# Patient Record
Sex: Male | Born: 1970 | Race: White | Hispanic: No | Marital: Single | Smoking: Former smoker
Health system: Southern US, Community
[De-identification: ages and names within clinical notes are randomized; demographics above are authoritative.]

## PROBLEM LIST (undated history)

## (undated) DIAGNOSIS — E781 Pure hyperglyceridemia: Secondary | ICD-10-CM

## (undated) DIAGNOSIS — I255 Ischemic cardiomyopathy: Secondary | ICD-10-CM

## (undated) DIAGNOSIS — I10 Essential (primary) hypertension: Secondary | ICD-10-CM

## (undated) DIAGNOSIS — I2510 Atherosclerotic heart disease of native coronary artery without angina pectoris: Secondary | ICD-10-CM

## (undated) DIAGNOSIS — F1010 Alcohol abuse, uncomplicated: Secondary | ICD-10-CM

## (undated) DIAGNOSIS — I513 Intracardiac thrombosis, not elsewhere classified: Secondary | ICD-10-CM

## (undated) DIAGNOSIS — F419 Anxiety disorder, unspecified: Secondary | ICD-10-CM

## (undated) DIAGNOSIS — R059 Cough, unspecified: Secondary | ICD-10-CM

## (undated) DIAGNOSIS — R570 Cardiogenic shock: Secondary | ICD-10-CM

## (undated) DIAGNOSIS — I469 Cardiac arrest, cause unspecified: Secondary | ICD-10-CM

## (undated) DIAGNOSIS — I5020 Unspecified systolic (congestive) heart failure: Secondary | ICD-10-CM

## (undated) DIAGNOSIS — I509 Heart failure, unspecified: Secondary | ICD-10-CM

## (undated) DIAGNOSIS — I499 Cardiac arrhythmia, unspecified: Secondary | ICD-10-CM

## (undated) DIAGNOSIS — K219 Gastro-esophageal reflux disease without esophagitis: Secondary | ICD-10-CM

## (undated) DIAGNOSIS — Z720 Tobacco use: Secondary | ICD-10-CM

---

## 2001-09-25 ENCOUNTER — Emergency Department (HOSPITAL_COMMUNITY): Admission: EM | Admit: 2001-09-25 | Discharge: 2001-09-25 | Payer: Self-pay | Admitting: Emergency Medicine

## 2001-09-25 ENCOUNTER — Encounter: Payer: Self-pay | Admitting: Emergency Medicine

## 2001-10-02 ENCOUNTER — Emergency Department (HOSPITAL_COMMUNITY): Admission: EM | Admit: 2001-10-02 | Discharge: 2001-10-02 | Payer: Self-pay | Admitting: Emergency Medicine

## 2005-08-15 ENCOUNTER — Emergency Department (HOSPITAL_COMMUNITY): Admission: EM | Admit: 2005-08-15 | Discharge: 2005-08-15 | Payer: Self-pay | Admitting: Emergency Medicine

## 2005-12-17 ENCOUNTER — Inpatient Hospital Stay (HOSPITAL_COMMUNITY): Admission: EM | Admit: 2005-12-17 | Discharge: 2005-12-18 | Payer: Self-pay | Admitting: Emergency Medicine

## 2010-02-26 ENCOUNTER — Emergency Department (HOSPITAL_COMMUNITY): Admission: EM | Admit: 2010-02-26 | Discharge: 2010-02-26 | Payer: Self-pay | Admitting: Emergency Medicine

## 2011-07-30 ENCOUNTER — Encounter (HOSPITAL_COMMUNITY): Payer: Self-pay | Admitting: Emergency Medicine

## 2011-07-30 ENCOUNTER — Emergency Department (INDEPENDENT_AMBULATORY_CARE_PROVIDER_SITE_OTHER)
Admission: EM | Admit: 2011-07-30 | Discharge: 2011-07-30 | Disposition: A | Discharge status: Home / Self Care | Payer: BC Managed Care – PPO | Source: Home / Self Care | Attending: Family Medicine | Admitting: Family Medicine

## 2011-07-30 DIAGNOSIS — K219 Gastro-esophageal reflux disease without esophagitis: Secondary | ICD-10-CM

## 2011-07-30 HISTORY — DX: Gastro-esophageal reflux disease without esophagitis: K21.9

## 2011-07-30 MED ORDER — ESOMEPRAZOLE MAGNESIUM 40 MG PO CPDR: 40.0000 mg | DELAYED_RELEASE_CAPSULE | Freq: Every day | ORAL | Status: AC

## 2011-07-30 NOTE — ED Provider Notes (Signed)
History     CSN: [id number]  Arrival date & time 07/30/11  1632   First MD Initiated Contact with Patient 07/30/11 1729      Chief Complaint  Patient presents with  . Gastrophageal Reflux    (Consider location/radiation/quality/duration/timing/severity/associated sxs/prior treatment) Patient is a 41 y.o. male presenting with GERD. The history is provided by the patient.  Gastrophageal Reflux This is a chronic (chronic acid reflux problem, prior w/u..) problem. The current episode started more than 1 week ago. The problem has been gradually worsening. Pertinent negatives include no abdominal pain. The symptoms are relieved by medications.    Past Medical History  Diagnosis Date  . GERD (gastroesophageal reflux disease)     History reviewed. No pertinent past surgical history.  No family history on file.  History  Substance Use Topics  . Smoking status: Current Everyday Smoker  . Smokeless tobacco: Not on file  . Alcohol Use: Yes      Review of Systems  Constitutional: Negative.   Cardiovascular: Negative.   Gastrointestinal: Negative.  Negative for nausea, abdominal pain and blood in stool.    Allergies  Review of patient's allergies indicates no known allergies.  Home Medications   Current Outpatient Rx  Name Route Sig Dispense Refill  . RANITIDINE HCL 75 MG PO TABS Oral Take 75 mg by mouth 2 (two) times daily.    Marland Kitchen ESOMEPRAZOLE MAGNESIUM 40 MG PO CPDR Oral Take 1 capsule (40 mg total) by mouth daily. 30 capsule 1    BP 168/118  Pulse 90  Temp(Src) 98.3 F (36.8 C) (Oral)  Resp 18  SpO2 98%  Physical Exam  Nursing note and vitals reviewed. Constitutional: He is oriented to person, place, and time. He appears well-developed and well-nourished.  Cardiovascular: Normal rate, normal heart sounds and intact distal pulses.   Pulmonary/Chest: Breath sounds normal.  Abdominal: Soft. Bowel sounds are normal. He exhibits no distension and no mass. There is no  tenderness.  Neurological: He is alert and oriented to person, place, and time.  Skin: Skin is warm and dry.    ED Course  Procedures (including critical care time)  Labs Reviewed - No data to display No results found.   1. GERD (gastroesophageal reflux disease)       MDM          Barkley Bruns, MD 07/30/11 [phone]

## 2011-07-30 NOTE — ED Notes (Signed)
PT HERE EPIGASTRIC BURNING PAIN WITH EATING AND AFTER THAT STARTED X 2WEEKS AGO.PT HAS BEEN TAKING NEXIUM AND RANITIDINE IN WHICH HE RAN OUT X 2WEEKS AGO.PT HERE FOR RX REFILL

## 2015-09-29 ENCOUNTER — Emergency Department (HOSPITAL_COMMUNITY)
Admission: EM | Admit: 2015-09-29 | Discharge: 2015-09-29 | Disposition: A | Discharge status: Home / Self Care | Payer: BLUE CROSS/BLUE SHIELD | Attending: Emergency Medicine | Admitting: Emergency Medicine

## 2015-09-29 ENCOUNTER — Encounter (HOSPITAL_COMMUNITY): Payer: Self-pay | Admitting: Emergency Medicine

## 2015-09-29 ENCOUNTER — Emergency Department (HOSPITAL_COMMUNITY): Payer: BLUE CROSS/BLUE SHIELD

## 2015-09-29 DIAGNOSIS — Y9289 Other specified places as the place of occurrence of the external cause: Secondary | ICD-10-CM | POA: Diagnosis not present

## 2015-09-29 DIAGNOSIS — Z79899 Other long term (current) drug therapy: Secondary | ICD-10-CM | POA: Insufficient documentation

## 2015-09-29 DIAGNOSIS — S01512A Laceration without foreign body of oral cavity, initial encounter: Secondary | ICD-10-CM | POA: Diagnosis not present

## 2015-09-29 DIAGNOSIS — F17200 Nicotine dependence, unspecified, uncomplicated: Secondary | ICD-10-CM | POA: Diagnosis not present

## 2015-09-29 DIAGNOSIS — Y998 Other external cause status: Secondary | ICD-10-CM | POA: Diagnosis not present

## 2015-09-29 DIAGNOSIS — G510 Bell's palsy: Secondary | ICD-10-CM | POA: Diagnosis not present

## 2015-09-29 DIAGNOSIS — X58XXXA Exposure to other specified factors, initial encounter: Secondary | ICD-10-CM | POA: Insufficient documentation

## 2015-09-29 DIAGNOSIS — Y9389 Activity, other specified: Secondary | ICD-10-CM | POA: Insufficient documentation

## 2015-09-29 DIAGNOSIS — K219 Gastro-esophageal reflux disease without esophagitis: Secondary | ICD-10-CM | POA: Insufficient documentation

## 2015-09-29 DIAGNOSIS — R29810 Facial weakness: Secondary | ICD-10-CM | POA: Diagnosis present

## 2015-09-29 LAB — COMPREHENSIVE METABOLIC PANEL
ALK PHOS: 78 U/L (ref 38–126)
ALT: 26 U/L (ref 17–63)
AST: 24 U/L (ref 15–41)
Albumin: 4.3 g/dL (ref 3.5–5.0)
Anion gap: 11 (ref 5–15)
BUN: 8 mg/dL (ref 6–20)
CALCIUM: 9.5 mg/dL (ref 8.9–10.3)
CHLORIDE: 105 mmol/L (ref 101–111)
CO2: 24 mmol/L (ref 22–32)
Creatinine, Ser: 1.09 mg/dL (ref 0.61–1.24)
GFR calc Af Amer: >60 mL/min (ref >60)
Glucose, Bld: 74 mg/dL (ref 65–99)
Potassium: 3.8 mmol/L (ref 3.5–5.1)
Sodium: 140 mmol/L (ref 135–145)
TOTAL PROTEIN: 7.5 g/dL (ref 6.5–8.1)
Total Bilirubin: 0.5 mg/dL (ref 0.3–1.2)

## 2015-09-29 LAB — DIFFERENTIAL
BASOS ABS: 0 10*3/uL (ref 0.0–0.1)
BASOS PCT: 0 %
Eosinophils Absolute: 0.2 10*3/uL (ref 0.0–0.7)
Eosinophils Relative: 3 %
LYMPHS ABS: 3.1 10*3/uL (ref 0.7–4.0)
LYMPHS PCT: 44 %
MONOS PCT: 8 %
Monocytes Absolute: 0.5 10*3/uL (ref 0.1–1.0)
NEUTROS ABS: 3.1 10*3/uL (ref 1.7–7.7)
Neutrophils Relative %: 45 %

## 2015-09-29 LAB — I-STAT TROPONIN, ED: Troponin i, poc: 0.01 ng/mL (ref 0.00–0.08)

## 2015-09-29 LAB — CBC
HEMATOCRIT: 48 % (ref 39.0–52.0)
HEMOGLOBIN: 15.9 g/dL (ref 13.0–17.0)
MCH: 30 pg (ref 26.0–34.0)
MCHC: 33.1 g/dL (ref 30.0–36.0)
MCV: 90.6 fL (ref 78.0–100.0)
Platelets: 322 10*3/uL (ref 150–400)
RBC: 5.3 MIL/uL (ref 4.22–5.81)
RDW: 12.4 % (ref 11.5–15.5)
WBC: 6.9 10*3/uL (ref 4.0–10.5)

## 2015-09-29 LAB — PROTIME-INR
INR: 0.95 (ref 0.00–1.49)
Prothrombin Time: 12.9 seconds (ref 11.6–15.2)

## 2015-09-29 LAB — I-STAT CHEM 8, ED
BUN: 8 mg/dL (ref 6–20)
CALCIUM ION: 1.13 mmol/L (ref 1.12–1.23)
Chloride: 103 mmol/L (ref 101–111)
Creatinine, Ser: 1.1 mg/dL (ref 0.61–1.24)
Glucose, Bld: 72 mg/dL (ref 65–99)
HCT: 52 % (ref 39.0–52.0)
Hemoglobin: 17.7 g/dL — ABNORMAL HIGH (ref 13.0–17.0)
Potassium: 3.9 mmol/L (ref 3.5–5.1)
SODIUM: 142 mmol/L (ref 135–145)
TCO2: 25 mmol/L (ref 0–100)

## 2015-09-29 LAB — APTT: APTT: 32 s (ref 24–37)

## 2015-09-29 MED ORDER — VALACYCLOVIR HCL 1 G PO TABS: 1000.0000 mg | ORAL_TABLET | Freq: Three times a day (TID) | ORAL | Status: AC

## 2015-09-29 MED ORDER — PREDNISONE 20 MG PO TABS
60.0000 mg | ORAL_TABLET | Freq: Once | ORAL | Status: AC
  Administered 2015-09-29: 60 mg via ORAL
  Filled 2015-09-29: qty 3

## 2015-09-29 MED ORDER — VALACYCLOVIR HCL 500 MG PO TABS
1000.0000 mg | ORAL_TABLET | Freq: Once | ORAL | Status: AC
  Administered 2015-09-29: 1000 mg via ORAL
  Filled 2015-09-29: qty 2

## 2015-09-29 MED ORDER — CEPHALEXIN 500 MG PO CAPS: 500.0000 mg | ORAL_CAPSULE | Freq: Four times a day (QID) | ORAL | Status: DC

## 2015-09-29 MED ORDER — PREDNISONE 10 MG PO TABS: 20.0000 mg | ORAL_TABLET | Freq: Every day | ORAL | Status: DC

## 2015-09-29 NOTE — Discharge Instructions (Signed)
Bell Palsy Bell palsy is a condition in which the muscles on one side of the face become paralyzed. This often causes one side of the face to droop. It is a common condition and most people recover completely. RISK FACTORS Risk factors for Bell palsy include:  Pregnancy.  Diabetes.  An infection by a virus, such as infections that cause cold sores. CAUSES  Bell palsy is caused by damage to or inflammation of a nerve in your face. It is unclear why this happens, but an infection by a virus may lead to it. Most of the time the reason it happens is unknown. SIGNS AND SYMPTOMS  Symptoms can range from mild to severe and can take place over a number of hours. Symptoms may include:  Being unable to:  Raise one or both eyebrows.  Close one or both eyes.  Feel parts of your face (facial numbness).  Drooping of the eyelid and corner of the mouth.  Weakness in the face.  Paralysis of half your face.  Loss of taste.  Sensitivity to loud noises.  Difficulty chewing.  Tearing up of the affected eye.  Dryness in the affected eye.  Drooling.  Pain behind one ear. DIAGNOSIS  Diagnosis of Bell palsy may include:  A medical history and physical exam.  An MRI.  A CT scan.  Electromyography (EMG). This is a test that checks how your nerves are working. TREATMENT  Treatment may include antiviral medicine to help shorten the length of the condition. Sometimes treatment is not needed and the symptoms go away on their own. HOME CARE INSTRUCTIONS   Take medicines only as directed by your health care provider.  Do facial massages and exercises as directed by your health care provider.  If your eye is affected:  Use moisturizing eye drops to prevent drying of your eye as directed by your health care provider.  Protect your eye as directed by your health care provider. SEEK MEDICAL CARE IF:  Your symptoms do not get better or get worse.  You are drooling.  Your eye is red,  irritated, or hurts. SEEK IMMEDIATE MEDICAL CARE IF:   Another part of your body feels weak or numb.  You have difficulty swallowing.  You have a fever along with symptoms of Bell palsy.  You develop neck pain. MAKE SURE YOU:   Understand these instructions.  Will watch your condition.  Will get help right away if you are not doing well or get worse.   This information is not intended to replace advice given to you by your health care provider. Make sure you discuss any questions you have with your health care provider.   Document Released: 05/11/2005 Document Revised: 01/30/2015 Document Reviewed: 08/18/2013 Elsevier Interactive Patient Education 2016 [employer]. Hypertension Hypertension, commonly called high blood pressure, is when the force of blood pumping through your arteries is too strong. Your arteries are the blood vessels that carry blood from your heart throughout your body. A blood pressure reading consists of a higher number over a lower number, such as 110/72. The higher number (systolic) is the pressure inside your arteries when your heart pumps. The lower number (diastolic) is the pressure inside your arteries when your heart relaxes. Ideally you want your blood pressure below 120/80. Hypertension forces your heart to work harder to pump blood. Your arteries may become narrow or stiff. Having untreated or uncontrolled hypertension can cause heart attack, stroke, kidney disease, and other problems. RISK FACTORS Some risk factors for  high blood pressure are controllable. Others are not.  Risk factors you cannot control include:   Race. You may be at higher risk if you are African American.  Age. Risk increases with age.  Gender. Men are at higher risk than women before age 74 years. After age 54, women are at higher risk than men. Risk factors you can control include:  Not getting enough exercise or physical activity.  Being overweight.  Getting too much fat,  sugar, calories, or salt in your diet.  Drinking too much alcohol. SIGNS AND SYMPTOMS Hypertension does not usually cause signs or symptoms. Extremely high blood pressure (hypertensive crisis) may cause headache, anxiety, shortness of breath, and nosebleed. DIAGNOSIS To check if you have hypertension, your health care provider will measure your blood pressure while you are seated, with your arm held at the level of your heart. It should be measured at least twice using the same arm. Certain conditions can cause a difference in blood pressure between your right and left arms. A blood pressure reading that is higher than normal on one occasion does not mean that you need treatment. If it is not clear whether you have high blood pressure, you may be asked to return on a different day to have your blood pressure checked again. Or, you may be asked to monitor your blood pressure at home for 1 or more weeks. TREATMENT Treating high blood pressure includes making lifestyle changes and possibly taking medicine. Living a healthy lifestyle can help lower high blood pressure. You may need to change some of your habits. Lifestyle changes may include:  Following the DASH diet. This diet is high in fruits, vegetables, and whole grains. It is low in salt, red meat, and added sugars.  Keep your sodium intake below 2,300 mg per day.  Getting at least 30-45 minutes of aerobic exercise at least 4 times per week.  Losing weight if necessary.  Not smoking.  Limiting alcoholic beverages.  Learning ways to reduce stress. Your health care provider may prescribe medicine if lifestyle changes are not enough to get your blood pressure under control, and if one of the following is true:  You are 30-66 years of age and your systolic blood pressure is above 140.  You are 78 years of age or older, and your systolic blood pressure is above 150.  Your diastolic blood pressure is above 90.  You have diabetes, and your  systolic blood pressure is over 449 or your diastolic blood pressure is over 90.  You have kidney disease and your blood pressure is above 140/90.  You have heart disease and your blood pressure is above 140/90. Your personal target blood pressure may vary depending on your medical conditions, your age, and other factors. HOME CARE INSTRUCTIONS  Have your blood pressure rechecked as directed by your health care provider.   Take medicines only as directed by your health care provider. Follow the directions carefully. Blood pressure medicines must be taken as prescribed. The medicine does not work as well when you skip doses. Skipping doses also puts you at risk for problems.  Do not smoke.   Monitor your blood pressure at home as directed by your health care provider. SEEK MEDICAL CARE IF:   You think you are having a reaction to medicines taken.  You have recurrent headaches or feel dizzy.  You have swelling in your ankles.  You have trouble with your vision. SEEK IMMEDIATE MEDICAL CARE IF:  You develop a severe  headache or confusion.  You have unusual weakness, numbness, or feel faint.  You have severe chest or abdominal pain.  You vomit repeatedly.  You have trouble breathing. MAKE SURE YOU:   Understand these instructions.  Will watch your condition.  Will get help right away if you are not doing well or get worse.   This information is not intended to replace advice given to you by your health care provider. Make sure you discuss any questions you have with your health care provider.   Document Released: 05/11/2005 Document Revised: 09/25/2014 Document Reviewed: 03/03/2013 Elsevier Interactive Patient Education [employer].

## 2015-09-29 NOTE — ED Notes (Signed)
Patient verbalized understanding of discharge instructions and denies any further needs or questions at this time. VS stable. Patient ambulatory with steady gait, declined wheelchair.

## 2015-09-29 NOTE — ED Notes (Signed)
C/o R sided facial droop, difficulty closing R eye, R temporal pain and posterior neck pain since yesterday.  Pt also reports severe pain to R tip of tongue from biting it 1 week ago and thought symptoms were related.  No arm drift.  No leg drift.

## 2015-09-29 NOTE — ED Provider Notes (Signed)
CSN: [id number]     Arrival date & time 09/29/15  1648 History   First MD Initiated Contact with Patient 09/29/15 1741     Chief Complaint  Patient presents with  . Facial Droop     (Consider location/radiation/quality/duration/timing/severity/associated sxs/prior Treatment) HPI  45 y.o. Male presents right side facial droop began yesterday.  He bit his tongue one week ago and has pain on tip of tongue at site where he bit, but does not think he had any symptoms at that time leading to the tongue biting.  He has some temporal discomfort, but no definite recent fever, uri, or ear infection. No known sick contacts.  He denies leg, arm, vision changes or weakness.   Past Medical History  Diagnosis Date  . GERD (gastroesophageal reflux disease)    History reviewed. No pertinent past surgical history. No family history on file. Social History  Substance Use Topics  . Smoking status: Current Every Day Smoker  . Smokeless tobacco: None  . Alcohol Use: Yes    Review of Systems  All other systems reviewed and are negative.     Allergies  Review of patient's allergies indicates no known allergies.  Home Medications   Prior to Admission medications   Medication Sig Start Date End Date Taking? Authorizing Provider  ranitidine (ZANTAC) 75 MG tablet Take 75 mg by mouth 2 (two) times daily.    Historical Provider, MD   BP 156/107 mmHg  Pulse 97  Temp(Src) 98.2 F (36.8 C) (Oral)  Resp 18  Ht 5' 8"  (1.727 m)  Wt 81.194 kg  BMI 27.22 kg/m2  SpO2 98% Physical Exam  Constitutional: He is oriented to person, place, and time. He appears well-developed and well-nourished.  HENT:  Head: Normocephalic and atraumatic.  Right Ear: Tympanic membrane and external ear normal.  Left Ear: Tympanic membrane and external ear normal.  Nose: Nose normal. Right sinus exhibits no maxillary sinus tenderness and no frontal sinus tenderness. Left sinus exhibits no maxillary sinus tenderness and no  frontal sinus tenderness.  Tongue with macerated area right tip. No area of abscess noted with no lymphadenopathy and submandibular area.  Eyes: Conjunctivae and EOM are normal. Pupils are equal, round, and reactive to light. Right eye exhibits no nystagmus. Left eye exhibits no nystagmus.  Neck: Normal range of motion. Neck supple.  Cardiovascular: Normal rate, regular rhythm, normal heart sounds and intact distal pulses.   Pulmonary/Chest: Effort normal and breath sounds normal. No respiratory distress. He exhibits no tenderness.  Abdominal: Soft. Bowel sounds are normal. He exhibits no distension and no mass. There is no tenderness.  Musculoskeletal: Normal range of motion. He exhibits no edema or tenderness.  Neurological: He is alert and oriented to person, place, and time. He has normal strength. No sensory deficit. He displays a negative Romberg sign. GCS eye subscore is 4. GCS verbal subscore is 5. GCS motor subscore is 6.  Reflex Scores:      Brachioradialis reflexes are 1+ on the right side and 1+ on the left side.      Patellar reflexes are 2+ on the right side and 2+ on the left side. Patient with normal gait without ataxia, shuffling, spasm, or antalgia. Right facial droop with no movement of right forehead. Patient unable to completely close his right eye. Extremities are equal strength bilaterally  Skin: Skin is warm and dry. No rash noted.  Psychiatric: He has a normal mood and affect. His behavior is normal. Judgment and thought content  normal.  Nursing note and vitals reviewed.   ED Course  Procedures (including critical care time) Labs Review Labs Reviewed  I-STAT CHEM 8, ED - Abnormal; Notable for the following:    Hemoglobin 17.7 (*)    All other components within normal limits  PROTIME-INR  APTT  CBC  DIFFERENTIAL  COMPREHENSIVE METABOLIC PANEL  I-STAT TROPOININ, ED  CBG MONITORING, ED    Imaging Review Ct Head Wo Contrast  09/29/2015  CLINICAL DATA:  RIGHT  facial droop, inability to close RIGHT eye, increased watering in RIGHT eye, cannot blink, stroke symptoms, smoker EXAM: CT HEAD WITHOUT CONTRAST TECHNIQUE: Contiguous axial images were obtained from the base of the skull through the vertex without intravenous contrast. COMPARISON:  None FINDINGS: Normal ventricular morphology. No midline shift or mass effect. Normal appearance of brain parenchyma. No intracranial hemorrhage, mass lesion, or evidence of acute infarction. No extra-axial fluid collections. Visualized paranasal sinuses and mastoid air cells clear. Nasal septal deviation to the RIGHT. Bones otherwise unremarkable. IMPRESSION: No acute intracranial abnormalities. Electronically Signed   By: Lavonia Dana M.D.   On: 09/29/2015 18:16   I have personally reviewed and evaluated these images and lab results as part of my medical decision-making.   EKG Interpretation   Date/Time:  Sunday Sep 29 2015 17:17:04 EDT Ventricular Rate:  88 PR Interval:  136 QRS Duration: 96 QT Interval:  366 QTC Calculation: 442 R Axis:   93 Text Interpretation:  Normal sinus rhythm Rightward axis Borderline ECG  Confirmed by Makenzie Vittorio MD, Isidor Bromell ([id number]) on 09/29/2015 6:04:42 PM      MDM   Final diagnoses:  Bell's palsy  Tongue laceration, initial encounter    Bell's palsy- plan prednsione acyclovir, f/u neuro Tongue laceration - keflex   Pattricia Boss, MD 10/02/15 1557

## 2015-09-29 NOTE — ED Notes (Signed)
MD has already assessed patient and discharged patient before RN assessment.

## 2019-08-08 DIAGNOSIS — H524 Presbyopia: Secondary | ICD-10-CM | POA: Diagnosis not present

## 2019-10-17 ENCOUNTER — Emergency Department (HOSPITAL_COMMUNITY): Payer: BLUE CROSS/BLUE SHIELD

## 2019-10-17 ENCOUNTER — Encounter (HOSPITAL_COMMUNITY): Payer: Self-pay | Admitting: Physician Assistant

## 2019-10-17 ENCOUNTER — Inpatient Hospital Stay (HOSPITAL_COMMUNITY)
Admission: EM | Admit: 2019-10-17 | Discharge: 2019-10-18 | DRG: 247 | Disposition: A | Discharge status: Home / Self Care | Payer: BLUE CROSS/BLUE SHIELD | Attending: Cardiovascular Disease | Admitting: Cardiovascular Disease

## 2019-10-17 ENCOUNTER — Encounter (HOSPITAL_COMMUNITY)
Admission: EM | Disposition: A | Discharge status: Home / Self Care | Payer: Self-pay | Source: Home / Self Care | Attending: Cardiovascular Disease

## 2019-10-17 ENCOUNTER — Other Ambulatory Visit: Payer: Self-pay

## 2019-10-17 DIAGNOSIS — K219 Gastro-esophageal reflux disease without esophagitis: Secondary | ICD-10-CM | POA: Diagnosis not present

## 2019-10-17 DIAGNOSIS — I214 Non-ST elevation (NSTEMI) myocardial infarction: Principal | ICD-10-CM

## 2019-10-17 DIAGNOSIS — G8929 Other chronic pain: Secondary | ICD-10-CM | POA: Diagnosis not present

## 2019-10-17 DIAGNOSIS — I2510 Atherosclerotic heart disease of native coronary artery without angina pectoris: Secondary | ICD-10-CM

## 2019-10-17 DIAGNOSIS — F1010 Alcohol abuse, uncomplicated: Secondary | ICD-10-CM | POA: Diagnosis present

## 2019-10-17 DIAGNOSIS — Z8249 Family history of ischemic heart disease and other diseases of the circulatory system: Secondary | ICD-10-CM | POA: Diagnosis not present

## 2019-10-17 DIAGNOSIS — F17210 Nicotine dependence, cigarettes, uncomplicated: Secondary | ICD-10-CM | POA: Diagnosis not present

## 2019-10-17 DIAGNOSIS — Z720 Tobacco use: Secondary | ICD-10-CM

## 2019-10-17 DIAGNOSIS — Z20822 Contact with and (suspected) exposure to covid-19: Secondary | ICD-10-CM | POA: Diagnosis present

## 2019-10-17 DIAGNOSIS — Z955 Presence of coronary angioplasty implant and graft: Secondary | ICD-10-CM

## 2019-10-17 DIAGNOSIS — E785 Hyperlipidemia, unspecified: Secondary | ICD-10-CM | POA: Diagnosis not present

## 2019-10-17 DIAGNOSIS — M25569 Pain in unspecified knee: Secondary | ICD-10-CM | POA: Diagnosis not present

## 2019-10-17 DIAGNOSIS — I161 Hypertensive emergency: Secondary | ICD-10-CM | POA: Diagnosis present

## 2019-10-17 DIAGNOSIS — E782 Mixed hyperlipidemia: Secondary | ICD-10-CM | POA: Diagnosis not present

## 2019-10-17 DIAGNOSIS — I10 Essential (primary) hypertension: Secondary | ICD-10-CM | POA: Diagnosis not present

## 2019-10-17 DIAGNOSIS — R079 Chest pain, unspecified: Secondary | ICD-10-CM | POA: Diagnosis not present

## 2019-10-17 DIAGNOSIS — I2582 Chronic total occlusion of coronary artery: Secondary | ICD-10-CM | POA: Diagnosis present

## 2019-10-17 DIAGNOSIS — R0602 Shortness of breath: Secondary | ICD-10-CM | POA: Diagnosis not present

## 2019-10-17 DIAGNOSIS — I255 Ischemic cardiomyopathy: Secondary | ICD-10-CM | POA: Diagnosis present

## 2019-10-17 DIAGNOSIS — Z9861 Coronary angioplasty status: Secondary | ICD-10-CM

## 2019-10-17 HISTORY — DX: Tobacco use: Z72.0

## 2019-10-17 HISTORY — PX: CORONARY STENT INTERVENTION: CATH118234

## 2019-10-17 HISTORY — PX: LEFT HEART CATH AND CORONARY ANGIOGRAPHY: CATH118249

## 2019-10-17 HISTORY — DX: Alcohol abuse, uncomplicated: F10.10

## 2019-10-17 LAB — HEPATIC FUNCTION PANEL
ALT: 37 U/L (ref 0–44)
AST: 47 U/L — ABNORMAL HIGH (ref 15–41)
Albumin: 3.8 g/dL (ref 3.5–5.0)
Alkaline Phosphatase: 78 U/L (ref 38–126)
Bilirubin, Direct: 0.2 mg/dL (ref 0.0–0.2)
Indirect Bilirubin: 0.9 mg/dL (ref 0.3–0.9)
Total Bilirubin: 1.1 mg/dL (ref 0.3–1.2)
Total Protein: 7.4 g/dL (ref 6.5–8.1)

## 2019-10-17 LAB — TROPONIN I (HIGH SENSITIVITY)
Troponin I (High Sensitivity): 2723 ng/L (ref <18)
Troponin I (High Sensitivity): 3639 ng/L (ref <18)

## 2019-10-17 LAB — CBC
HCT: 46.4 % (ref 39.0–52.0)
Hemoglobin: 15.6 g/dL (ref 13.0–17.0)
MCH: 30.2 pg (ref 26.0–34.0)
MCHC: 33.6 g/dL (ref 30.0–36.0)
MCV: 89.9 fL (ref 80.0–100.0)
Platelets: 296 10*3/uL (ref 150–400)
RBC: 5.16 MIL/uL (ref 4.22–5.81)
RDW: 12 % (ref 11.5–15.5)
WBC: 9.2 10*3/uL (ref 4.0–10.5)
nRBC: 0 % (ref 0.0–0.2)

## 2019-10-17 LAB — BASIC METABOLIC PANEL
Anion gap: 10 (ref 5–15)
BUN: 8 mg/dL (ref 6–20)
CO2: 24 mmol/L (ref 22–32)
Calcium: 9 mg/dL (ref 8.9–10.3)
Chloride: 104 mmol/L (ref 98–111)
Creatinine, Ser: 1.03 mg/dL (ref 0.61–1.24)
GFR calc Af Amer: >60 mL/min (ref >60)
GFR calc non Af Amer: >60 mL/min (ref >60)
Glucose, Bld: 98 mg/dL (ref 70–99)
Potassium: 3.6 mmol/L (ref 3.5–5.1)
Sodium: 138 mmol/L (ref 135–145)

## 2019-10-17 LAB — ECHOCARDIOGRAM COMPLETE
Height: 68 in
Weight: 2864.22 oz

## 2019-10-17 LAB — POCT ACTIVATED CLOTTING TIME
Activated Clotting Time: 224 seconds
Activated Clotting Time: 235 seconds

## 2019-10-17 LAB — SARS CORONAVIRUS 2 BY RT PCR (HOSPITAL ORDER, PERFORMED IN ~~LOC~~ HOSPITAL LAB): SARS Coronavirus 2: NEGATIVE

## 2019-10-17 LAB — HIV ANTIBODY (ROUTINE TESTING W REFLEX): HIV Screen 4th Generation wRfx: NONREACTIVE

## 2019-10-17 LAB — PROTIME-INR
INR: 0.9 (ref 0.8–1.2)
Prothrombin Time: 11.9 seconds (ref 11.4–15.2)

## 2019-10-17 LAB — BRAIN NATRIURETIC PEPTIDE: B Natriuretic Peptide: 608.8 pg/mL — ABNORMAL HIGH (ref 0.0–100.0)

## 2019-10-17 SURGERY — LEFT HEART CATH AND CORONARY ANGIOGRAPHY
Anesthesia: LOCAL

## 2019-10-17 MED ORDER — SODIUM CHLORIDE 0.9% FLUSH: 3.0000 mL | INTRAVENOUS | Status: DC | PRN

## 2019-10-17 MED ORDER — ASPIRIN 81 MG PO CHEW
324.0000 mg | CHEWABLE_TABLET | Freq: Once | ORAL | Status: AC
  Administered 2019-10-17: 324 mg via ORAL
  Filled 2019-10-17: qty 4

## 2019-10-17 MED ORDER — METOPROLOL TARTRATE 25 MG PO TABS
25.0000 mg | ORAL_TABLET | Freq: Two times a day (BID) | ORAL | Status: DC
  Administered 2019-10-17 (×2): 25 mg via ORAL
  Filled 2019-10-17 (×2): qty 1

## 2019-10-17 MED ORDER — ASPIRIN 81 MG PO CHEW: 81.0000 mg | CHEWABLE_TABLET | ORAL | Status: DC

## 2019-10-17 MED ORDER — FOLIC ACID 1 MG PO TABS: 1.0000 mg | ORAL_TABLET | Freq: Every day | ORAL | Status: DC

## 2019-10-17 MED ORDER — NITROGLYCERIN 1 MG/10 ML FOR IR/CATH LAB
INTRA_ARTERIAL | Status: AC
  Filled 2019-10-17: qty 10

## 2019-10-17 MED ORDER — NITROGLYCERIN 1 MG/10 ML FOR IR/CATH LAB
INTRA_ARTERIAL | Status: DC | PRN
  Administered 2019-10-17: 150 ug

## 2019-10-17 MED ORDER — FOLIC ACID 1 MG PO TABS
1.0000 mg | ORAL_TABLET | Freq: Every day | ORAL | Status: DC
  Administered 2019-10-17 – 2019-10-18 (×2): 1 mg via ORAL
  Filled 2019-10-17 (×2): qty 1

## 2019-10-17 MED ORDER — LIDOCAINE HCL (PF) 1 % IJ SOLN
INTRAMUSCULAR | Status: AC
  Filled 2019-10-17: qty 30

## 2019-10-17 MED ORDER — ONDANSETRON HCL 4 MG/2ML IJ SOLN: 4.0000 mg | Freq: Four times a day (QID) | INTRAMUSCULAR | Status: DC | PRN

## 2019-10-17 MED ORDER — ADULT MULTIVITAMIN W/MINERALS CH: 1.0000 | ORAL_TABLET | Freq: Every day | ORAL | Status: DC

## 2019-10-17 MED ORDER — HYDRALAZINE HCL 20 MG/ML IJ SOLN: 10.0000 mg | INTRAMUSCULAR | Status: AC | PRN

## 2019-10-17 MED ORDER — SODIUM CHLORIDE 0.9% FLUSH
3.0000 mL | Freq: Once | INTRAVENOUS | Status: AC
  Administered 2019-10-17: 3 mL via INTRAVENOUS

## 2019-10-17 MED ORDER — SODIUM CHLORIDE 0.9 % WEIGHT BASED INFUSION: 3.0000 mL/kg/h | INTRAVENOUS | Status: DC

## 2019-10-17 MED ORDER — LABETALOL HCL 5 MG/ML IV SOLN: 10.0000 mg | INTRAVENOUS | Status: AC | PRN

## 2019-10-17 MED ORDER — TICAGRELOR 90 MG PO TABS
ORAL_TABLET | ORAL | Status: DC | PRN
  Administered 2019-10-17: 180 mg via ORAL

## 2019-10-17 MED ORDER — CLOPIDOGREL BISULFATE 75 MG PO TABS
75.0000 mg | ORAL_TABLET | Freq: Every day | ORAL | Status: DC
  Administered 2019-10-18: 75 mg via ORAL
  Filled 2019-10-17 (×2): qty 1

## 2019-10-17 MED ORDER — TICAGRELOR 90 MG PO TABS
ORAL_TABLET | ORAL | Status: AC
  Filled 2019-10-17: qty 2

## 2019-10-17 MED ORDER — MIDAZOLAM HCL 2 MG/2ML IJ SOLN
INTRAMUSCULAR | Status: AC
  Filled 2019-10-17: qty 2

## 2019-10-17 MED ORDER — MIDAZOLAM HCL 2 MG/2ML IJ SOLN
INTRAMUSCULAR | Status: DC | PRN
  Administered 2019-10-17: 2 mg via INTRAVENOUS

## 2019-10-17 MED ORDER — FENTANYL CITRATE (PF) 100 MCG/2ML IJ SOLN
INTRAMUSCULAR | Status: DC | PRN
  Administered 2019-10-17: 25 ug via INTRAVENOUS

## 2019-10-17 MED ORDER — LIDOCAINE HCL (PF) 1 % IJ SOLN
INTRAMUSCULAR | Status: DC | PRN
  Administered 2019-10-17: 3 mL

## 2019-10-17 MED ORDER — FUROSEMIDE 10 MG/ML IJ SOLN: 40.0000 mg | Freq: Once | INTRAMUSCULAR | Status: DC

## 2019-10-17 MED ORDER — ATORVASTATIN CALCIUM 80 MG PO TABS
80.0000 mg | ORAL_TABLET | Freq: Every day | ORAL | Status: DC
  Administered 2019-10-17 – 2019-10-18 (×2): 80 mg via ORAL
  Filled 2019-10-17 (×2): qty 1

## 2019-10-17 MED ORDER — THIAMINE HCL 100 MG/ML IJ SOLN
100.0000 mg | Freq: Every day | INTRAMUSCULAR | Status: DC
  Filled 2019-10-17: qty 2

## 2019-10-17 MED ORDER — NITROGLYCERIN IN D5W 200-5 MCG/ML-% IV SOLN
0.0000 ug/min | INTRAVENOUS | Status: DC
  Administered 2019-10-17: 5 ug/min via INTRAVENOUS
  Filled 2019-10-17: qty 250

## 2019-10-17 MED ORDER — SODIUM CHLORIDE 0.9 % IV SOLN: 250.0000 mL | INTRAVENOUS | Status: DC | PRN

## 2019-10-17 MED ORDER — LORAZEPAM 2 MG/ML IJ SOLN: 1.0000 mg | INTRAMUSCULAR | Status: DC | PRN

## 2019-10-17 MED ORDER — HEPARIN SODIUM (PORCINE) 1000 UNIT/ML IJ SOLN
INTRAMUSCULAR | Status: AC
  Filled 2019-10-17: qty 1

## 2019-10-17 MED ORDER — ATORVASTATIN CALCIUM 80 MG PO TABS: 80.0000 mg | ORAL_TABLET | Freq: Every day | ORAL | Status: DC

## 2019-10-17 MED ORDER — VERAPAMIL HCL 2.5 MG/ML IV SOLN
INTRAVENOUS | Status: DC | PRN
  Administered 2019-10-17: 10 mL via INTRA_ARTERIAL

## 2019-10-17 MED ORDER — ACETAMINOPHEN 325 MG PO TABS: 650.0000 mg | ORAL_TABLET | ORAL | Status: DC | PRN

## 2019-10-17 MED ORDER — FENTANYL CITRATE (PF) 100 MCG/2ML IJ SOLN
INTRAMUSCULAR | Status: AC
  Filled 2019-10-17: qty 2

## 2019-10-17 MED ORDER — HEPARIN SODIUM (PORCINE) 1000 UNIT/ML IJ SOLN
INTRAMUSCULAR | Status: DC | PRN
  Administered 2019-10-17 (×2): 4000 [IU] via INTRAVENOUS
  Administered 2019-10-17: 2000 [IU] via INTRAVENOUS
  Administered 2019-10-17: 4000 [IU] via INTRAVENOUS

## 2019-10-17 MED ORDER — HEPARIN (PORCINE) IN NACL 1000-0.9 UT/500ML-% IV SOLN
INTRAVENOUS | Status: AC
  Filled 2019-10-17: qty 1000

## 2019-10-17 MED ORDER — SODIUM CHLORIDE 0.9% FLUSH
3.0000 mL | Freq: Two times a day (BID) | INTRAVENOUS | Status: DC
  Administered 2019-10-18: 3 mL via INTRAVENOUS

## 2019-10-17 MED ORDER — VERAPAMIL HCL 2.5 MG/ML IV SOLN
INTRAVENOUS | Status: AC
  Filled 2019-10-17: qty 2

## 2019-10-17 MED ORDER — IOHEXOL 350 MG/ML SOLN
INTRAVENOUS | Status: DC | PRN
  Administered 2019-10-17: 110 mL

## 2019-10-17 MED ORDER — ADULT MULTIVITAMIN W/MINERALS CH
1.0000 | ORAL_TABLET | Freq: Every day | ORAL | Status: DC
  Administered 2019-10-17 – 2019-10-18 (×2): 1 via ORAL
  Filled 2019-10-17 (×2): qty 1

## 2019-10-17 MED ORDER — THIAMINE HCL 100 MG PO TABS: 100.0000 mg | ORAL_TABLET | Freq: Every day | ORAL | Status: DC

## 2019-10-17 MED ORDER — SODIUM CHLORIDE 0.9 % WEIGHT BASED INFUSION: 1.0000 mL/kg/h | INTRAVENOUS | Status: DC

## 2019-10-17 MED ORDER — ASPIRIN EC 81 MG PO TBEC
81.0000 mg | DELAYED_RELEASE_TABLET | Freq: Every day | ORAL | Status: DC
  Administered 2019-10-18: 81 mg via ORAL
  Filled 2019-10-17: qty 1

## 2019-10-17 MED ORDER — FUROSEMIDE 10 MG/ML IJ SOLN
40.0000 mg | Freq: Once | INTRAMUSCULAR | Status: AC
  Administered 2019-10-17: 40 mg via INTRAVENOUS

## 2019-10-17 MED ORDER — LORAZEPAM 1 MG PO TABS
1.0000 mg | ORAL_TABLET | ORAL | Status: DC | PRN
  Administered 2019-10-17: 1 mg via ORAL
  Filled 2019-10-17: qty 1

## 2019-10-17 MED ORDER — HEPARIN (PORCINE) IN NACL 1000-0.9 UT/500ML-% IV SOLN
INTRAVENOUS | Status: DC | PRN
  Administered 2019-10-17 (×2): 500 mL

## 2019-10-17 MED ORDER — THIAMINE HCL 100 MG/ML IJ SOLN: 100.0000 mg | Freq: Every day | INTRAMUSCULAR | Status: DC

## 2019-10-17 MED ORDER — SODIUM CHLORIDE 0.9 % IV SOLN: INTRAVENOUS | Status: AC

## 2019-10-17 MED ORDER — SODIUM CHLORIDE 0.9% FLUSH
3.0000 mL | Freq: Two times a day (BID) | INTRAVENOUS | Status: DC
  Administered 2019-10-17: 3 mL via INTRAVENOUS

## 2019-10-17 MED ORDER — FUROSEMIDE 10 MG/ML IJ SOLN
INTRAMUSCULAR | Status: AC
  Filled 2019-10-17: qty 4

## 2019-10-17 MED ORDER — HEPARIN SODIUM (PORCINE) 5000 UNIT/ML IJ SOLN
4000.0000 [IU] | Freq: Once | INTRAMUSCULAR | Status: AC
  Administered 2019-10-17: 4000 [IU] via INTRAVENOUS
  Filled 2019-10-17: qty 1

## 2019-10-17 MED ORDER — NITROGLYCERIN 0.4 MG SL SUBL: 0.4000 mg | SUBLINGUAL_TABLET | SUBLINGUAL | Status: DC | PRN

## 2019-10-17 MED ORDER — SODIUM CHLORIDE 0.9 % IV SOLN
INTRAVENOUS | Status: AC | PRN
  Administered 2019-10-17: 10 mL/h via INTRAVENOUS

## 2019-10-17 MED ORDER — CLOPIDOGREL BISULFATE 300 MG PO TABS
300.0000 mg | ORAL_TABLET | Freq: Once | ORAL | Status: AC
  Administered 2019-10-17: 300 mg via ORAL
  Filled 2019-10-17: qty 1

## 2019-10-17 MED ORDER — THIAMINE HCL 100 MG PO TABS
100.0000 mg | ORAL_TABLET | Freq: Every day | ORAL | Status: DC
  Administered 2019-10-17 – 2019-10-18 (×2): 100 mg via ORAL
  Filled 2019-10-17 (×2): qty 1

## 2019-10-17 MED ORDER — HEPARIN (PORCINE) 25000 UT/250ML-% IV SOLN
1050.0000 [IU]/h | INTRAVENOUS | Status: DC
  Administered 2019-10-17: 1050 [IU]/h via INTRAVENOUS
  Filled 2019-10-17: qty 250

## 2019-10-17 SURGICAL SUPPLY — 18 items
BALLN  ~~LOC~~ SAPPHIRE 4.5X12 (BALLOONS) ×2
BALLN SAPPHIRE 2.5X15 (BALLOONS) ×2
BALLN ~~LOC~~ SAPPHIRE 4.5X12 (BALLOONS) ×1
BALLOON SAPPHIRE 2.5X15 (BALLOONS) IMPLANT
BALLOON ~~LOC~~ SAPPHIRE 4.5X12 (BALLOONS) IMPLANT
CATH 5FR JL3.5 JR4 ANG PIG MP (CATHETERS) ×1 IMPLANT
CATH VISTA GUIDE 6FR JR4 (CATHETERS) ×1 IMPLANT
DEVICE RAD COMP TR BAND LRG (VASCULAR PRODUCTS) ×1 IMPLANT
GLIDESHEATH SLEND SS 6F .021 (SHEATH) ×1 IMPLANT
GUIDEWIRE INQWIRE 1.5J.035X260 (WIRE) IMPLANT
INQWIRE 1.5J .035X260CM (WIRE) ×2
KIT ENCORE 26 ADVANTAGE (KITS) ×1 IMPLANT
KIT HEART LEFT (KITS) ×2 IMPLANT
PACK CARDIAC CATHETERIZATION (CUSTOM PROCEDURE TRAY) ×2 IMPLANT
STENT RESOLUTE ONYX 4.0X18 (Permanent Stent) ×1 IMPLANT
TRANSDUCER W/STOPCOCK (MISCELLANEOUS) ×2 IMPLANT
TUBING CIL FLEX 10 FLL-RA (TUBING) ×2 IMPLANT
WIRE COUGAR XT STRL 190CM (WIRE) ×1 IMPLANT

## 2019-10-17 NOTE — Progress Notes (Signed)
ANTICOAGULATION CONSULT NOTE - Initial Consult  Pharmacy Consult for heparin Indication: chest pain/ACS  No Known Allergies  Patient Measurements: Height: 5' 8"  (172.7 cm) Weight: 81.2 kg (179 lb 0.2 oz) IBW/kg (Calculated) : 68.4 Heparin Dosing Weight: 81 kg  Vital Signs: Temp: 98.8 F (37.1 C) (05/25 0550) BP: 151/105 (05/25 0905) Pulse Rate: 88 (05/25 0905)  Labs: Recent Labs    10/17/19 0605  HGB 15.6  HCT 46.4  PLT 296  CREATININE 1.03  TROPONINIHS 2,723*    Estimated Creatinine Clearance: 85.8 mL/min (by C-G formula based on SCr of 1.03 mg/dL).   Medical History: Past Medical History:  Diagnosis Date  . GERD (gastroesophageal reflux disease)     Medications:  (Not in a hospital admission)   Assessment: 89 YOM who presents with SOB and chest pain now with elevated troponins. Pharmacy consulted to start IV heparin for ACS. SCr, H/H and Plt wnl.   Of note patient has already received an IV heparin bolus.   Goal of Therapy:  Heparin level 0.3-0.7 units/ml Monitor platelets by anticoagulation protocol: Yes   Plan:  -Start heparin infusion at 1050 units/hr  -F/u 6 hr HL -Monitor daily HL, CBC and s/s of bleeding   Albertina Parr, PharmD., BCPS, BCCCP Clinical Pharmacist Clinical phone for 10/17/19 until 3:30pm: [phone] If after 3:30pm, please refer to [facility] for unit-specific pharmacist

## 2019-10-17 NOTE — Interval H&P Note (Signed)
Cath Lab Visit (complete for each Cath Lab visit)  Clinical Evaluation Leading to the Procedure:   ACS: Yes.    Non-ACS:    Anginal Classification: CCS IV  Anti-ischemic medical therapy: Minimal Therapy (1 class of medications)  Non-Invasive Test Results: No non-invasive testing performed  Prior CABG: No previous CABG      History and Physical Interval Note:  10/17/2019 1:57 PM  William Crawford  has presented today for surgery, with the diagnosis of NSTEMI.  The various methods of treatment have been discussed with the patient and family. After consideration of risks, benefits and other options for treatment, the patient has consented to  Procedure(s): LEFT HEART CATH AND CORONARY ANGIOGRAPHY (N/A) as a surgical intervention.  The patient's history has been reviewed, patient examined, no change in status, stable for surgery.  I have reviewed the patient's chart and labs.  Questions were answered to the patient's satisfaction.     Sherren Mocha

## 2019-10-17 NOTE — H&P (View-Only) (Signed)
Cardiology Admission History and Physical:   Patient ID: William Crawford MRN: [id number]; DOB: 14-May-1971   Admission date: 10/17/2019  Primary Care Provider: Patient, No Pcp Per Primary Cardiologist: New to Dr. Burt Knack   Chief Complaint:  CP and SOB  Patient Profile:   William Crawford is a 49 y.o. male with hx of tobacco smoking > 30 pack year, alcohol abuse and family hx of CAD presented for CP and SOB and found to have elevated troponin.   History of Present Illness:   William Crawford had sudden onset substernal chest pressure radiating to jaw and shoulder. Episode last for 6-8 hours. Since then he has intermittent chest pain and shortness of breath with and without exertion. Similar to initial episode but not as intense but progressively getting worse. Yesterday he had worsening shortness of breath with orthopnea and PND. Slipping on multiple pillows. This morning he started to have 7/10 substernal chest pressure an came to ER.   BNP 600. CXR with interstitial edema. He is hypertensive. Hs-troponin 2723. Started on IV heparin and IV nitro. Pain improved to 4-5/10. COVID pending. Got ASA 353m. Will give IV lasix 464mqd.    He drinks 12 pack of beer almost every other day and liquir over weekend. Smokes tobacco 1 pack per day since age [age]Father and uncle had Mi in their 50[address]snd diet of it. No regular visit with PCP. No palpitations, dizziness or syncope. He works as paCurator  Past Medical History:  Diagnosis Date  . Alcohol abuse   . GERD (gastroesophageal reflux disease)   . Tobacco abuse     No past surgical history on file.   Medications Prior to Admission: Prior to Admission medications   Medication Sig Start Date End Date Taking? Authorizing Provider  cetirizine (ZYRTEC) 10 MG tablet Take 10 mg by mouth daily.   Yes [provider]  predniSONE (DELTASONE) 10 MG tablet Take 2 tablets (20 mg total) by mouth daily. Patient not taking: Reported on 10/17/2019 09/29/15    RaPattricia BossMD     Allergies:   No Known Allergies  Social History:   Social History   Socioeconomic History  . Marital status: Married    Spouse name: Not on file  . Number of children: Not on file  . Years of education: Not on file  . Highest education level: Not on file  Occupational History  . Not on file  Tobacco Use  . Smoking status: Current Every Day Smoker  Substance and Sexual Activity  . Alcohol use: Yes  . Drug use: No  . Sexual activity: Not on file  Other Topics Concern  . Not on file  Social History Narrative  . Not on file   Social Determinants of Health   Financial Resource Strain:   . Difficulty of Paying Living Expenses:   Food Insecurity:   . Worried About [name] the Last Year:   . RaArboriculturistn the Last Year:   Transportation Needs:   . LaFilm/video editorMedical):   . Marland Kitchenack of Transportation (Non-Medical):   Physical Activity:   . Days of Exercise per Week:   . Minutes of Exercise per Session:   Stress:   . Feeling of Stress :   Social Connections:   . Frequency of Communication with Friends and Family:   . Frequency of Social Gatherings with Friends and Family:   . Attends Religious Services:   . Active  Member of Clubs or Organizations:   . Attends Archivist Meetings:   Marland Kitchen Marital Status:   Intimate Partner Violence:   . Fear of Current or Ex-Partner:   . Emotionally Abused:   Marland Kitchen Physically Abused:   . Sexually Abused:     Family History:   The patient's family history includes Heart attack in his father.    ROS:  Please see the history of present illness.  All other ROS reviewed and negative.     Physical Exam/Data:   Vitals:   10/17/19 1000 10/17/19 1015 10/17/19 1041 10/17/19 1100  BP: (!) 156/125 (!) 157/94 (!) 162/104 (!) 179/115  Pulse: 98 91 95 (!) 104  Resp: 14 (!) 23 (!) 21 18  Temp:      SpO2: 96% 96% 96% 98%  Weight:      Height:       No intake or output data in the 24  hours ending 10/17/19 1104 Last 3 Weights 10/17/2019 09/29/2015  Weight (lbs) 179 lb 0.2 oz 179 lb  Weight (kg) 81.2 kg 81.194 kg     Body mass index is 27.22 kg/m.  General:  Well nourished, well developed, in no acute distress HEENT: normal Lymph: no adenopathy Neck: noJVD Endocrine:  No thryomegaly Vascular: No carotid bruits; FA pulses 2+ bilaterally without bruits  Cardiac:  normal S1, S2; RRR; no murmur Lungs:  Diffuse crackles  Abd: soft, nontender, no hepatomegaly  Ext: no edema Musculoskeletal:  No deformities, BUE and BLE strength normal and equal Skin: warm and dry  Neuro:  CNs 2-12 intact, no focal abnormalities noted Psych:  Normal affect    EKG:  The ECG that was done today was personally reviewed and demonstrates sinus rhythm with st depression   Relevant CV Studies: Pending cath   Laboratory Data:  High Sensitivity Troponin:   Recent Labs  Lab 10/17/19 0605  TROPONINIHS 2,723*      Chemistry Recent Labs  Lab 10/17/19 0605  NA 138  K 3.6  CL 104  CO2 24  GLUCOSE 98  BUN 8  CREATININE 1.03  CALCIUM 9.0  GFRNONAA >60  GFRAA >60  ANIONGAP 10    Hematology Recent Labs  Lab 10/17/19 0605  WBC 9.2  RBC 5.16  HGB 15.6  HCT 46.4  MCV 89.9  MCH 30.2  MCHC 33.6  RDW 12.0  PLT 296   BNP Recent Labs  Lab 10/17/19 0605  BNP 608.8*     Radiology/Studies:  DG Chest 2 View  Result Date: 10/17/2019 CLINICAL DATA:  Chest pain EXAM: CHEST - 2 VIEW COMPARISON:  None. FINDINGS: Normal heart size and mediastinal contours. Diffuse interstitial opacity with Kerley lines. No pleural effusion or consolidation. No pneumothorax IMPRESSION: Interstitial edema. Electronically Signed   By: Monte Fantasia M.D.   On: 10/17/2019 06:20   TIMI Risk Score for Unstable Angina or Non-ST Elevation MI:   The patient's TIMI risk score is 3, which indicates a 13% risk of all cause mortality, new or recurrent myocardial infarction or need for urgent revascularization  in the next 14 days.   Assessment and Plan:   1. NSTEMI - presentation consistence with ACS. He might had MI last week when had severe crushing chest pressure, SOB radiating to his jaw and shoulder.  - Hs-troponin 2723, pending second reading.  - EKG with ST depression.  - CP improving with IV nitro and heparin.  - Got ASA 375m -Stat echo -Cath early afternoon - The patient  understands that risks include but are not limited to stroke (1 in 1000), death (1 in 52), kidney failure [usually temporary] (1 in 500), bleeding (1 in 200), allergic reaction [possibly serious] (1 in 200), and agrees to proceed.  - Add Lipitor 34m  - Get A1c and Lipid panel  2. Pulmonary edema - BP elevated. CXR with interstitial edema - Give Iv lasix 441mx 1.  - Stat echo  3. Untreated Hypertension - BP elevated. LVH on EKG - Start BB post cath - On nitro currently  4. Tobacco smoking - Advised cessation  5.Alcohol abuse - CIWA protocol    Severity of Illness: The appropriate patient status for this patient is INPATIENT. Inpatient status is judged to be reasonable and necessary in order to provide the required intensity of service to ensure the patient's safety. The patient's presenting symptoms, physical exam findings, and initial radiographic and laboratory data in the context of their chronic comorbidities is felt to place them at high risk for further clinical deterioration. Furthermore, it is not anticipated that the patient will be medically stable for discharge from the hospital within 2 midnights of admission. The following factors support the patient status of inpatient.   " The patient's presenting symptoms include CP and SOB. " The worrisome physical exam findings include crackles on exam  " The initial radiographic and laboratory data are worrisome because of Elevated troponin  " The chronic co-morbidities include tobacco smoking   * I certify that at the point of admission it is my  clinical judgment that the patient will require inpatient hospital care spanning beyond 2 midnights from the point of admission due to high intensity of service, high risk for further deterioration and high frequency of surveillance required.*    For questions or updates, please contact [facility] consult [email or web address] for contact info under   SiJarrett SohoPA  10/17/2019 11:04 AM   Patient seen, examined. Available data reviewed. Agree with findings, assessment, and plan as outlined by ViRobbie LisPA-C.  [age]ear old male with no significant past medical history presents now with non-STEMI.  On my exam, he is alert, oriented, in no distress sitting at about 45 degrees in bed.  JVP is normal, lungs have bilateral inspiratory rales, carotid upstrokes are normal without bruits, heart is regular rate and rhythm without murmur.  There is no S4 gallop.  Abdomen is soft and nontender, extremities have no edema, skin is warm and dry, neurologic is grossly intact with 5/5 strength bilaterally.  High-sensitivity troponin is initially 2723, increased on second set to 3639.  BNP is elevated at 609.  Chest x-ray shows interstitial edema bilaterally.  Renal function is normal.  EKG shows sinus tachycardia 101 bpm, ST/T wave changes consider inferolateral ischemia, rightward axis.  The patient presents with non-STEMI with high risk features of pulmonary edema.  He has had intermittent symptoms now for greater than 1 week.  We will check a stat 2D echocardiogram, continue IV nitroglycerin and heparin, give 1 dose of IV furosemide 40 mg, and anticipate cardiac catheterization this afternoon.  I do not think the patient can lay flat for the procedure at present.  Hopefully he will improve within a few hours of receiving IV furosemide and we will proceed with urgent cardiac catheterization and possible PCI.  I have reviewed the risks, indications, and alternatives to cardiac catheterization,  possible angioplasty, and stenting with the patient. Risks include but are not limited to bleeding, infection, vascular injury, stroke, myocardial  infection, arrhythmia, kidney injury, radiation-related injury in the case of prolonged fluoroscopy use, emergency cardiac surgery, and death. The patient understands the risks of serious complication is 1-2 in 7001 with diagnostic cardiac cath and 1-2% or less with angioplasty/stenting.   Sherren Mocha, M.D. 10/17/2019 11:24 AM

## 2019-10-17 NOTE — Progress Notes (Signed)
Pt had 3 second pause and 2 second pause. Pt's asymptomatic RN notified Card on call. Will continue to monitor.

## 2019-10-17 NOTE — Progress Notes (Signed)
  Echocardiogram 2D Echocardiogram has been performed.  William Crawford M 10/17/2019, 11:48 AM

## 2019-10-17 NOTE — Progress Notes (Signed)
Dr Burt Knack in to talk to pt about the side effects of Brinlinta he was having at this time.  Per Dr Judene Companion will be stopped and continue with Plavix.

## 2019-10-17 NOTE — ED Notes (Signed)
Cards Provider at bedside.

## 2019-10-17 NOTE — ED Triage Notes (Signed)
To ED for eval of sob since last night. States he had cp earlier in the week - center of chest. Pt states he has been taking otc cold medicine and which is associated the sob with - unable to sleep well last night. Speaks in full but slightly broken sentences. SOB is worse with any exertion. Denies recent leg pain. No LE swelling noted.

## 2019-10-17 NOTE — ED Provider Notes (Signed)
[facility] EMERGENCY DEPARTMENT Provider Note   CSN: [id number] Arrival date & time: 10/17/19  0548     History Chief Complaint  Patient presents with  . Shortness of Breath    William Crawford is a 49 y.o. male.  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  Patient does not have a well-defined past medical history and states that he has not seen a doctor in a long time for routine physical.  Patient's father passed away of heart disease in mid-50's.  Patient states that sometime last week (5/19?) he was at work and developed a pressure sensation across his lower chest.  This continued into the evening and then improved but never completely subsided.  It was 10/10.  He did not seek any medical attention for this.  Patient reports that earlier this morning he had difficulty breathing prompting ED visit.  He reports that sometimes he has difficulty breathing due to chronic sinusitis.  He reports current nasal congestion.  He states that he felt worse as time went on which made his shortness of breath worse.  He denied any worsening chest pain with the spell but decided to come to the emergency department for evaluation.  While he was in the waiting room he reports a very slight pressure across his lower pec region which remains.  No associated diaphoresis, vomiting.  Pain did not radiate.  No abdominal pain or back pain.  No weakness, numbness, or tingling in extremities.  Patient is able to lie flat to sleep but reports that it is not unusual for him to sleep on a couple pillows due to history of GERD.  No lower extremity tenderness, swelling or scrotal edema.  Reports chronic knee pain.        Past Medical History:  Diagnosis Date  . GERD (gastroesophageal reflux disease)     There are no problems to display for this patient.   No past surgical history  on file.     No family history on file.  Social History   Tobacco Use  . Smoking status: Current Every Day Smoker  Substance Use Topics  . Alcohol use: Yes  . Drug use: No    Home Medications Prior to Admission medications   Medication Sig Start Date End Date Taking? Authorizing Provider  cephALEXin (KEFLEX) 500 MG capsule Take 1 capsule (500 mg total) by mouth 4 (four) times daily. 09/29/15   Pattricia Boss, MD  predniSONE (DELTASONE) 10 MG tablet Take 2 tablets (20 mg total) by mouth daily. 09/29/15   Pattricia Boss, MD  ranitidine (ZANTAC) 75 MG tablet Take 75 mg by mouth 2 (two) times daily.    [provider]    Allergies    Patient has no known allergies.  Review of Systems   Review of Systems  Constitutional: Negative for diaphoresis and fever.  Eyes: Negative for redness.  Respiratory: Positive for shortness of breath. Negative for cough.   Cardiovascular: Positive for chest pain. Negative for palpitations and leg swelling.  Gastrointestinal: Negative for abdominal pain, nausea and vomiting.  Genitourinary: Negative for dysuria.  Musculoskeletal: Negative for back pain and neck pain.  Skin: Negative for rash.  Neurological: Negative for syncope and light-headedness.  Psychiatric/Behavioral: The patient is not nervous/anxious.     Physical Exam Updated Vital Signs BP (!) 151/105   Pulse 88   Temp 98.8 F (  37.1 C)   Resp (!) 21   Ht 5' 8"  (1.727 m)   Wt 81.2 kg   SpO2 98%   BMI 27.22 kg/m   Physical Exam Vitals and nursing note reviewed.  Constitutional:      Appearance: He is well-developed. He is not diaphoretic.  HENT:     Head: Normocephalic and atraumatic.     Mouth/Throat:     Mouth: Mucous membranes are not dry.  Eyes:     Conjunctiva/sclera: Conjunctivae normal.  Neck:     Vascular: Normal carotid pulses. No carotid bruit or JVD.     Trachea: Trachea normal. No tracheal deviation.  Cardiovascular:     Rate and Rhythm: Normal rate and  regular rhythm.     Pulses: No decreased pulses.     Heart sounds: Normal heart sounds, S1 normal and S2 normal. Heart sounds not distant. No murmur.  Pulmonary:     Effort: Pulmonary effort is normal. No respiratory distress.     Breath sounds: Normal breath sounds. No wheezing, rhonchi or rales.     Comments: No crackles noted Chest:     Chest wall: No tenderness.  Abdominal:     General: Bowel sounds are normal.     Palpations: Abdomen is soft.     Tenderness: There is no abdominal tenderness. There is no guarding or rebound.  Musculoskeletal:     Cervical back: Normal range of motion and neck supple. No muscular tenderness.     Right lower leg: No tenderness. No edema.     Left lower leg: No tenderness. No edema.     Comments: No clinical signs and symptoms of DVT or swelling on exam.  Skin:    General: Skin is warm and dry.     Coloration: Skin is not pale.  Neurological:     Mental Status: He is alert.     ED Results / Procedures / Treatments   Labs (all labs ordered are listed, but only abnormal results are displayed) Labs Reviewed  BRAIN NATRIURETIC PEPTIDE - Abnormal; Notable for the following components:      Result Value   B Natriuretic Peptide 608.8 (*)    All other components within normal limits  TROPONIN I (HIGH SENSITIVITY) - Abnormal; Notable for the following components:   Troponin I (High Sensitivity) 2,723 (*)    All other components within normal limits  SARS CORONAVIRUS 2 BY RT PCR (HOSPITAL ORDER, [facility])  BASIC METABOLIC PANEL  CBC  PROTIME-INR  HEPATIC FUNCTION PANEL  TROPONIN I (HIGH SENSITIVITY)    EKG EKG Interpretation  Date/Time:  Tuesday Oct 17 2019 09:14:43 EDT Ventricular Rate:  90 PR Interval:    QRS Duration: 95 QT Interval:  371 QTC Calculation: 454 R Axis:   83 Text Interpretation: Sinus rhythm LAE, consider biatrial enlargement Repol abnrm, lateral leads Confirmed by Veryl Speak [phone]) on  10/17/2019 9:24:29 AM   Radiology DG Chest 2 View  Result Date: 10/17/2019 CLINICAL DATA:  Chest pain EXAM: CHEST - 2 VIEW COMPARISON:  None. FINDINGS: Normal heart size and mediastinal contours. Diffuse interstitial opacity with Kerley lines. No pleural effusion or consolidation. No pneumothorax IMPRESSION: Interstitial edema. Electronically Signed   By: Monte Fantasia M.D.   On: 10/17/2019 06:20    Procedures Procedures (including critical care time)  Medications Ordered in ED Medications  nitroGLYCERIN 50 mg in dextrose 5 % 250 mL (0.2 mg/mL) infusion (15 mcg/min Intravenous Rate/Dose Change 10/17/19 1039)  heparin ADULT infusion 100 units/mL (25000 units/228m sodium chloride 0.45%) (1,050 Units/hr Intravenous New Bag/Given 10/17/19 1032)  sodium chloride flush (NS) 0.9 % injection 3 mL (3 mLs Intravenous Given 10/17/19 1006)  aspirin chewable tablet 324 mg (324 mg Oral Given 10/17/19 0958)  heparin injection 4,000 Units (4,000 Units Intravenous Given 10/17/19 05284    ED Course  I have reviewed the triage vital signs and the nursing notes.  Pertinent labs & imaging results that were available during my care of the patient were reviewed by me and considered in my medical decision making (see chart for details).  Patient seen and examined. Initial EKG and CXR reviewed personally prior to patient exam. EKG with diffuse depressions noted, changed from 2017.  Work-up initiated -- added BNP and troponins.   Vital signs reviewed and are as follows: BP (!) 151/105   Pulse 88   Temp 98.8 F (37.1 C)   Resp (!) 21   Ht 5' 8"  (1.727 m)   Wt 81.2 kg   SpO2 98%   BMI 27.22 kg/m   9:27 AM Repeat and original EKGs reviewed with Dr. DStark Jock No STEMI but concerning. ASA ordered. Awaiting trop. Will need cardiology consult.   10:21 AM Trop elevated.  Ordered heparin and nitroglycerin drip.  Patient updated.  Spoke with TWannetta Sender-- cardiology will see.  Additional labs ordered.  Patient reports  minimal discomfort right now.   10:48 AM Pt transferred to cath lab.   CRITICAL CARE Performed by: JCarlisle CaterPA-C Total critical care time: 40 minutes Critical care time was exclusive of separately billable procedures and treating other patients. Critical care was necessary to treat or prevent imminent or life-threatening deterioration. Critical care was time spent personally by me on the following activities: development of treatment plan with patient and/or surrogate as well as nursing, discussions with consultants, evaluation of patient's response to treatment, examination of patient, obtaining history from patient or surrogate, ordering and performing treatments and interventions, ordering and review of laboratory studies, ordering and review of radiographic studies, pulse oximetry and re-evaluation of patient's condition.     MDM Rules/Calculators/A&P                      Patient with ACS -- event likely last week. Possible now with some failure.  Will need admitted for cardiac catheterization, risk factor management.   Final Clinical Impression(s) / ED Diagnoses Final diagnoses:  NSTEMI (non-ST elevated myocardial infarction) ([facility]    Rx / [place] Discharge Orders    None       GCarlisle Cater PA-C 10/17/19 1049    DVeryl Speak MD 10/17/19 1445

## 2019-10-17 NOTE — H&P (Addendum)
Cardiology Admission History and Physical:   Patient ID: William Crawford MRN: [id number]; DOB: 1971-01-14   Admission date: 10/17/2019  Primary Care Provider: Patient, No Pcp Per Primary Cardiologist: New to Dr. Burt Knack   Chief Complaint:  CP and SOB  Patient Profile:   William Crawford is a 49 y.o. male with hx of tobacco smoking > 30 pack year, alcohol abuse and family hx of CAD presented for CP and SOB and found to have elevated troponin.   History of Present Illness:   William Crawford had sudden onset substernal chest pressure radiating to jaw and shoulder. Episode last for 6-8 hours. Since then he has intermittent chest pain and shortness of breath with and without exertion. Similar to initial episode but not as intense but progressively getting worse. Yesterday he had worsening shortness of breath with orthopnea and PND. Slipping on multiple pillows. This morning he started to have 7/10 substernal chest pressure an came to ER.   BNP 600. CXR with interstitial edema. He is hypertensive. Hs-troponin 2723. Started on IV heparin and IV nitro. Pain improved to 4-5/10. COVID pending. Got ASA 339m. Will give IV lasix 491mqd.    He drinks 12 pack of beer almost every other day and liquir over weekend. Smokes tobacco 1 pack per day since age 89Father and uncle had Mi in their 5029snd diet of it. No regular visit with PCP. No palpitations, dizziness or syncope. He works as paCurator  Past Medical History:  Diagnosis Date  . Alcohol abuse   . GERD (gastroesophageal reflux disease)   . Tobacco abuse     No past surgical history on file.   Medications Prior to Admission: Prior to Admission medications   Medication Sig Start Date End Date Taking? Authorizing Provider  cetirizine (ZYRTEC) 10 MG tablet Take 10 mg by mouth daily.   Yes [provider]  predniSONE (DELTASONE) 10 MG tablet Take 2 tablets (20 mg total) by mouth daily. Patient not taking: Reported on 10/17/2019 09/29/15    RaPattricia BossMD     Allergies:   No Known Allergies  Social History:   Social History   Socioeconomic History  . Marital status: Married    Spouse name: Not on file  . Number of children: Not on file  . Years of education: Not on file  . Highest education level: Not on file  Occupational History  . Not on file  Tobacco Use  . Smoking status: Current Every Day Smoker  Substance and Sexual Activity  . Alcohol use: Yes  . Drug use: No  . Sexual activity: Not on file  Other Topics Concern  . Not on file  Social History Narrative  . Not on file   Social Determinants of Health   Financial Resource Strain:   . Difficulty of Paying Living Expenses:   Food Insecurity:   . Worried About [name] the Last Year:   . RaArboriculturistn the Last Year:   Transportation Needs:   . LaFilm/video editorMedical):   . Marland Kitchenack of Transportation (Non-Medical):   Physical Activity:   . Days of Exercise per Week:   . Minutes of Exercise per Session:   Stress:   . Feeling of Stress :   Social Connections:   . Frequency of Communication with Friends and Family:   . Frequency of Social Gatherings with Friends and Family:   . Attends Religious Services:   . Active  Member of Clubs or Organizations:   . Attends Archivist Meetings:   Marland Kitchen Marital Status:   Intimate Partner Violence:   . Fear of Current or Ex-Partner:   . Emotionally Abused:   Marland Kitchen Physically Abused:   . Sexually Abused:     Family History:   The patient's family history includes Heart attack in his father.    ROS:  Please see the history of present illness.  All other ROS reviewed and negative.     Physical Exam/Data:   Vitals:   10/17/19 1000 10/17/19 1015 10/17/19 1041 10/17/19 1100  BP: (!) 156/125 (!) 157/94 (!) 162/104 (!) 179/115  Pulse: 98 91 95 (!) 104  Resp: 14 (!) 23 (!) 21 18  Temp:      SpO2: 96% 96% 96% 98%  Weight:      Height:       No intake or output data in the 24  hours ending 10/17/19 1104 Last 3 Weights 10/17/2019 09/29/2015  Weight (lbs) 179 lb 0.2 oz 179 lb  Weight (kg) 81.2 kg 81.194 kg     Body mass index is 27.22 kg/m.  General:  Well nourished, well developed, in no acute distress HEENT: normal Lymph: no adenopathy Neck: noJVD Endocrine:  No thryomegaly Vascular: No carotid bruits; FA pulses 2+ bilaterally without bruits  Cardiac:  normal S1, S2; RRR; no murmur Lungs:  Diffuse crackles  Abd: soft, nontender, no hepatomegaly  Ext: no edema Musculoskeletal:  No deformities, BUE and BLE strength normal and equal Skin: warm and dry  Neuro:  CNs 2-12 intact, no focal abnormalities noted Psych:  Normal affect    EKG:  The ECG that was done today was personally reviewed and demonstrates sinus rhythm with st depression   Relevant CV Studies: Pending cath   Laboratory Data:  High Sensitivity Troponin:   Recent Labs  Lab 10/17/19 0605  TROPONINIHS 2,723*      Chemistry Recent Labs  Lab 10/17/19 0605  NA 138  K 3.6  CL 104  CO2 24  GLUCOSE 98  BUN 8  CREATININE 1.03  CALCIUM 9.0  GFRNONAA >60  GFRAA >60  ANIONGAP 10    Hematology Recent Labs  Lab 10/17/19 0605  WBC 9.2  RBC 5.16  HGB 15.6  HCT 46.4  MCV 89.9  MCH 30.2  MCHC 33.6  RDW 12.0  PLT 296   BNP Recent Labs  Lab 10/17/19 0605  BNP 608.8*     Radiology/Studies:  DG Chest 2 View  Result Date: 10/17/2019 CLINICAL DATA:  Chest pain EXAM: CHEST - 2 VIEW COMPARISON:  None. FINDINGS: Normal heart size and mediastinal contours. Diffuse interstitial opacity with Kerley lines. No pleural effusion or consolidation. No pneumothorax IMPRESSION: Interstitial edema. Electronically Signed   By: Monte Fantasia M.D.   On: 10/17/2019 06:20   TIMI Risk Score for Unstable Angina or Non-ST Elevation MI:   The patient's TIMI risk score is 3, which indicates a 13% risk of all cause mortality, new or recurrent myocardial infarction or need for urgent revascularization  in the next 14 days.   Assessment and Plan:   1. NSTEMI - presentation consistence with ACS. He might had MI last week when had severe crushing chest pressure, SOB radiating to his jaw and shoulder.  - Hs-troponin 2723, pending second reading.  - EKG with ST depression.  - CP improving with IV nitro and heparin.  - Got ASA 325m -Stat echo -Cath early afternoon - The patient  understands that risks include but are not limited to stroke (1 in 1000), death (1 in 86), kidney failure [usually temporary] (1 in 500), bleeding (1 in 200), allergic reaction [possibly serious] (1 in 200), and agrees to proceed.  - Add Lipitor 66m  - Get A1c and Lipid panel  2. Pulmonary edema - BP elevated. CXR with interstitial edema - Give Iv lasix 461mx 1.  - Stat echo  3. Untreated Hypertension - BP elevated. LVH on EKG - Start BB post cath - On nitro currently  4. Tobacco smoking - Advised cessation  5.Alcohol abuse - CIWA protocol    Severity of Illness: The appropriate patient status for this patient is INPATIENT. Inpatient status is judged to be reasonable and necessary in order to provide the required intensity of service to ensure the patient's safety. The patient's presenting symptoms, physical exam findings, and initial radiographic and laboratory data in the context of their chronic comorbidities is felt to place them at high risk for further clinical deterioration. Furthermore, it is not anticipated that the patient will be medically stable for discharge from the hospital within 2 midnights of admission. The following factors support the patient status of inpatient.   " The patient's presenting symptoms include CP and SOB. " The worrisome physical exam findings include crackles on exam  " The initial radiographic and laboratory data are worrisome because of Elevated troponin  " The chronic co-morbidities include tobacco smoking   * I certify that at the point of admission it is my  clinical judgment that the patient will require inpatient hospital care spanning beyond 2 midnights from the point of admission due to high intensity of service, high risk for further deterioration and high frequency of surveillance required.*    For questions or updates, please contact [facility] consult [email or web address] for contact info under   SiJarrett SohoPA  10/17/2019 11:04 AM   Patient seen, examined. Available data reviewed. Agree with findings, assessment, and plan as outlined by ViRobbie LisPA-C.  [age]ear old male with no significant past medical history presents now with non-STEMI.  On my exam, he is alert, oriented, in no distress sitting at about 45 degrees in bed.  JVP is normal, lungs have bilateral inspiratory rales, carotid upstrokes are normal without bruits, heart is regular rate and rhythm without murmur.  There is no S4 gallop.  Abdomen is soft and nontender, extremities have no edema, skin is warm and dry, neurologic is grossly intact with 5/5 strength bilaterally.  High-sensitivity troponin is initially 2723, increased on second set to 3639.  BNP is elevated at 609.  Chest x-ray shows interstitial edema bilaterally.  Renal function is normal.  EKG shows sinus tachycardia 101 bpm, ST/T wave changes consider inferolateral ischemia, rightward axis.  The patient presents with non-STEMI with high risk features of pulmonary edema.  He has had intermittent symptoms now for greater than 1 week.  We will check a stat 2D echocardiogram, continue IV nitroglycerin and heparin, give 1 dose of IV furosemide 40 mg, and anticipate cardiac catheterization this afternoon.  I do not think the patient can lay flat for the procedure at present.  Hopefully he will improve within a few hours of receiving IV furosemide and we will proceed with urgent cardiac catheterization and possible PCI.  I have reviewed the risks, indications, and alternatives to cardiac catheterization,  possible angioplasty, and stenting with the patient. Risks include but are not limited to bleeding, infection, vascular injury, stroke, myocardial  infection, arrhythmia, kidney injury, radiation-related injury in the case of prolonged fluoroscopy use, emergency cardiac surgery, and death. The patient understands the risks of serious complication is 1-2 in 4665 with diagnostic cardiac cath and 1-2% or less with angioplasty/stenting.   Sherren Mocha, M.D. 10/17/2019 11:24 AM

## 2019-10-18 DIAGNOSIS — E782 Mixed hyperlipidemia: Secondary | ICD-10-CM

## 2019-10-18 DIAGNOSIS — I255 Ischemic cardiomyopathy: Secondary | ICD-10-CM

## 2019-10-18 DIAGNOSIS — Z720 Tobacco use: Secondary | ICD-10-CM

## 2019-10-18 DIAGNOSIS — E785 Hyperlipidemia, unspecified: Secondary | ICD-10-CM

## 2019-10-18 DIAGNOSIS — I10 Essential (primary) hypertension: Secondary | ICD-10-CM

## 2019-10-18 DIAGNOSIS — F1010 Alcohol abuse, uncomplicated: Secondary | ICD-10-CM

## 2019-10-18 LAB — BASIC METABOLIC PANEL
Anion gap: 10 (ref 5–15)
BUN: 8 mg/dL (ref 6–20)
CO2: 24 mmol/L (ref 22–32)
Calcium: 9.3 mg/dL (ref 8.9–10.3)
Chloride: 103 mmol/L (ref 98–111)
Creatinine, Ser: 1.1 mg/dL (ref 0.61–1.24)
GFR calc Af Amer: >60 mL/min (ref >60)
GFR calc non Af Amer: >60 mL/min (ref >60)
Glucose, Bld: 87 mg/dL (ref 70–99)
Potassium: 4.1 mmol/L (ref 3.5–5.1)
Sodium: 137 mmol/L (ref 135–145)

## 2019-10-18 LAB — CBC
HCT: 44.2 % (ref 39.0–52.0)
Hemoglobin: 14.7 g/dL (ref 13.0–17.0)
MCH: 29.9 pg (ref 26.0–34.0)
MCHC: 33.3 g/dL (ref 30.0–36.0)
MCV: 90 fL (ref 80.0–100.0)
Platelets: 296 10*3/uL (ref 150–400)
RBC: 4.91 MIL/uL (ref 4.22–5.81)
RDW: 12 % (ref 11.5–15.5)
WBC: 10.3 10*3/uL (ref 4.0–10.5)
nRBC: 0 % (ref 0.0–0.2)

## 2019-10-18 LAB — HEMOGLOBIN A1C
Hgb A1c MFr Bld: 5.2 % (ref 4.8–5.6)
Mean Plasma Glucose: 102.54 mg/dL

## 2019-10-18 LAB — LIPID PANEL
Cholesterol: 193 mg/dL (ref 0–200)
HDL: 37 mg/dL — ABNORMAL LOW (ref >40)
LDL Cholesterol: 122 mg/dL — ABNORMAL HIGH (ref 0–99)
Total CHOL/HDL Ratio: 5.2 RATIO
Triglycerides: 171 mg/dL — ABNORMAL HIGH (ref <150)
VLDL: 34 mg/dL (ref 0–40)

## 2019-10-18 MED ORDER — THE SENSUOUS HEART BOOK
Freq: Once | Status: DC
  Filled 2019-10-18: qty 1

## 2019-10-18 MED ORDER — ASPIRIN 81 MG PO TBEC: 81.0000 mg | DELAYED_RELEASE_TABLET | Freq: Every day | ORAL | 2 refills | Status: DC

## 2019-10-18 MED ORDER — HEART ATTACK BOUNCING BOOK
Freq: Once | Status: DC
  Filled 2019-10-18: qty 1

## 2019-10-18 MED ORDER — LOSARTAN POTASSIUM 25 MG PO TABS: 25.0000 mg | ORAL_TABLET | Freq: Every day | ORAL | 1 refills | Status: DC

## 2019-10-18 MED ORDER — CLOPIDOGREL BISULFATE 75 MG PO TABS: 75.0000 mg | ORAL_TABLET | Freq: Every day | ORAL | 2 refills | Status: DC

## 2019-10-18 MED ORDER — LOSARTAN POTASSIUM 25 MG PO TABS
25.0000 mg | ORAL_TABLET | Freq: Every day | ORAL | Status: DC
  Administered 2019-10-18: 25 mg via ORAL
  Filled 2019-10-18: qty 1

## 2019-10-18 MED ORDER — ATORVASTATIN CALCIUM 80 MG PO TABS: 80.0000 mg | ORAL_TABLET | Freq: Every day | ORAL | 1 refills | Status: DC

## 2019-10-18 MED ORDER — NITROGLYCERIN 0.4 MG SL SUBL: 0.4000 mg | SUBLINGUAL_TABLET | SUBLINGUAL | 2 refills | Status: DC | PRN

## 2019-10-18 MED ORDER — CARVEDILOL 3.125 MG PO TABS: 3.1250 mg | ORAL_TABLET | Freq: Two times a day (BID) | ORAL | Status: DC

## 2019-10-18 MED ORDER — ANGIOPLASTY BOOK
Freq: Once | Status: DC
  Filled 2019-10-18: qty 1

## 2019-10-18 MED ORDER — FUROSEMIDE 20 MG PO TABS
20.0000 mg | ORAL_TABLET | Freq: Every day | ORAL | 0 refills | Status: DC | PRN
Start: 2019-10-18 — End: 2019-11-11

## 2019-10-18 MED ORDER — PANTOPRAZOLE SODIUM 40 MG PO TBEC
40.0000 mg | DELAYED_RELEASE_TABLET | Freq: Every day | ORAL | Status: DC
  Administered 2019-10-18: 40 mg via ORAL
  Filled 2019-10-18: qty 1

## 2019-10-18 MED FILL — CLOPIDOGREL 75 MG TABLET: 75 | 90 days supply | Qty: 90 | Fill #0

## 2019-10-18 MED FILL — ATORVASTATIN CALCIUM 80 MG: 80 | 90 days supply | Qty: 90 | Fill #0

## 2019-10-18 MED FILL — NITROGLYCERIN 0.4 MG TAB SL: 0.4 | 8 days supply | Qty: 25 | Fill #0

## 2019-10-18 MED FILL — ASPIRIN LOW DOSE 81 MG TBEC: 81 | 90 days supply | Qty: 90 | Fill #0

## 2019-10-18 MED FILL — LOSARTAN POTASSIUM 25 MG TA: 25 | 30 days supply | Qty: 30 | Fill #0

## 2019-10-18 MED FILL — FUROSEMIDE 20 MG TAB: 20 | 30 days supply | Qty: 30 | Fill #0

## 2019-10-18 MED FILL — Lidocaine HCl Local Preservative Free (PF) Inj 1%: INTRAMUSCULAR | Qty: 30 | Status: AC

## 2019-10-18 MED FILL — Nitroglycerin IV Soln 100 MCG/ML in D5W: INTRA_ARTERIAL | Qty: 10 | Status: AC

## 2019-10-18 NOTE — Progress Notes (Signed)
Pt .had another 2.71 second pause ;then at 0331 had 3.43 secs.pause.the patient was sleeping when assessed .V/S taken B/P taken 113/83;hr=52; R=20;O2 sat=97% RA.Will continue to monitor pt.

## 2019-10-18 NOTE — Discharge Instructions (Signed)
You are scheduled for you intervention on Wednesday 10/25/2019 @ 0830.  Please arrive at the hospital [facility], admitting @ 0630.  Nothing to eat of drink After midnight Tuesday night.  Please take all morning meds the day of procedure including Aspirin and Plavix.  Don't take your Lasix Tuesday or Wednesday.  Plan to spend the night.

## 2019-10-18 NOTE — Care Management (Signed)
Per Fenton Malling. [facility]. [phone].  Co-pay amount for Entresto 24-26 mg.bid for a 30 day supply $25.00.  No PA required No Deductible Tier 2 Pharmacy Retail: [place].  Mail Order Pharmacy : [facility]: 90 day supply Entresto 24-19m bid $75.00.  Ref#[id number].

## 2019-10-18 NOTE — Discharge Summary (Signed)
Discharge Summary    Patient ID: William Crawford,  MRN: [id number], DOB/AGE: [birth year] 49 y.o.  Admit date: 10/17/2019 Discharge date: 10/18/2019  Primary Care Provider: Patient, No Pcp Per Primary Cardiologist: Dr. Burt Knack  Discharge Diagnoses    Principal Problem:   NSTEMI (non-ST elevated myocardial infarction) [facility]) Active Problems:   Tobacco use   Ischemic cardiomyopathy   Hypertension   Hyperlipemia   Alcohol abuse   Allergies No Known Allergies  Diagnostic Studies/Procedures   Cath: 10/17/19   1.  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  Normal LVEDP   Recommendations: Guideline directed medical therapy, DAPT with aspirin and ticagrelor at least 12 months without interruption, consider elective staged CTO-PCI of the LAD. Case reviewed with Dr Martinique.  Diagnostic Dominance: Right  Intervention   Echo: 10/17/19  IMPRESSIONS    1. Left ventricular ejection fraction, by estimation, is 35 to 40%. The  left ventricle has moderately decreased function. The left ventricle  demonstrates regional wall motion abnormalities. Severe inferolateral  hypokinesis, hypokinesis of the mid to  apical inferior wall. Left ventricular diastolic parameters are consistent  with Grade I diastolic dysfunction (impaired relaxation).  2. Right ventricular systolic function is normal. The right ventricular  size is normal. Tricuspid regurgitation signal is inadequate for assessing  PA pressure.  3. The mitral valve is normal in structure. No evidence of mitral valve  regurgitation. No evidence of mitral stenosis.  4. The aortic valve is  tricuspid. Aortic valve regurgitation is not  visualized. No aortic stenosis is present.  5. The inferior vena cava is normal in size with greater than 50%  respiratory variability, suggesting right atrial pressure of 3 mmHg.  _____________   History of Present Illness     William Crawford is 49 yo male with PMH of tobacco use, alcohol abuse and family hx of CAD who presented with a sudden onset substernal chest pressure radiating to jaw and shoulder. Episode last for 6-8 hours. Since then he had intermittent chest pain and shortness of breath with and without exertion. Similar to initial episode but not as intense but progressively got worse.  The day prior to admission he had worsening shortness of breath with orthopnea and PND.  Sleeping on multiple pillows.  The morning of admission he started to have 7/10 substernal chest pressure an came to ER.   BNP 600. CXR with interstitial edema. He is hypertensive. Hs-troponin 2723. Started on IV heparin and IV nitro. Pain improved to 4-5/10. COVID pending. Got ASA 361m.  Given IV lasix 434mqd.    He drinks 12 pack of beer almost every other day and liquir over weekend. Smokes tobacco 1 pack per day since age [age]Father and uncle had Mi in their 5057snd diet of it. No regular visit with PCP. No palpitations, dizziness or syncope. He works as paCurator  EKG showed ST depression.  His chest pain was improving with IV nitro and heparin.  He was admitted to cardiology with plans to undergo cardiac catheterization.  Hospital Course     Consultants: None  Underwent cardiac catheterization noted above with severe stenosis of the mid RCA which was  suspected culprit lesion treated with PCI/DES x1.  Also noted to have a chronic total occlusion of the proximal LAD and chronic occlusion of the first OM branch of circumflex with collaterals from the left coronary artery.  Placed on DAPT with aspirin and Brilinta for at least 1 year without interruption.  Case was  reviewed with Dr. Martinique with plans for elective staged CTO-PCI of the LAD.  High sensitivity troponin peaked at 3639.  Developed significant shortness of breath on Brilinta, decision was made to switch to plavix. He was loaded with 335m and then 734mdaily. LDL noted at 122 and placed on high-dose statin.  Hemoglobin A1c 5.2.  Follow-up echocardiogram showed an EF of 35 to 40% with severe inferior lateral hypokinesis along with mid to apical inferior hypokinesis, and grade 1 diastolic dysfunction. Unable to BB therapy 2/2 to nocturnal pauses noted on telemetry. He was started on ARB and tolerated well. Plan to transition to [facility] an outpatient if cost is not an issue. Worked well with cardiac rehab without recurrent chest pain. He is planned for staged intervention to the LAD with Dr. JoMartiniquen 10/25/19. _____________  Discharge Vitals Blood pressure (!) 138/98, pulse 78, temperature 97.6 F (36.4 C), temperature source Oral, resp. rate 18, height 5' 8"  (1.727 m), weight 83.1 kg, SpO2 97 %.  Filed Weights   10/17/19 0908 10/17/19 1757 10/18/19 0546  Weight: 81.2 kg 83.6 kg 83.1 kg    Labs & Radiologic Studies    CBC Recent Labs    10/17/19 0605 10/18/19 0532  WBC 9.2 10.3  HGB 15.6 14.7  HCT 46.4 44.2  MCV 89.9 90.0  PLT 296 29563 Basic Metabolic Panel Recent Labs    10/17/19 0605 10/18/19 0532  NA 138 137  K 3.6 4.1  CL 104 103  CO2 24 24  GLUCOSE 98 87  BUN 8 8  CREATININE 1.03 1.10  CALCIUM 9.0 9.3   Liver Function Tests Recent Labs    10/17/19 1008  AST 47*  ALT 37  ALKPHOS 78  BILITOT 1.1  PROT 7.4  ALBUMIN 3.8   No results for input(s): LIPASE, AMYLASE in the last 72 hours. Cardiac Enzymes No results for input(s): CKTOTAL, CKMB, CKMBINDEX, TROPONINI in the last 72 hours. BNP Invalid input(s): POCBNP D-Dimer No results for input(s): DDIMER in the last 72 hours. Hemoglobin A1C Recent Labs    10/18/19 0532  HGBA1C 5.2   Fasting Lipid  Panel Recent Labs    10/18/19 0532  CHOL 193  HDL 37*  LDLCALC 122*  TRIG 171*  CHOLHDL 5.2   Thyroid Function Tests No results for input(s): TSH, T4TOTAL, T3FREE, THYROIDAB in the last 72 hours.  Invalid input(s): FREET3 _____________  DG Chest 2 View  Result Date: 10/17/2019 CLINICAL DATA:  Chest pain EXAM: CHEST - 2 VIEW COMPARISON:  None. FINDINGS: Normal heart size and mediastinal contours. Diffuse interstitial opacity with Kerley lines. No pleural effusion or consolidation. No pneumothorax IMPRESSION: Interstitial edema. Electronically Signed   By: JoMonte Fantasia.D.   On: 10/17/2019 06:20   CARDIAC CATHETERIZATION  Result Date: 10/17/2019 1.  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  Normal LVEDP Recommendations: Guideline directed medical therapy, DAPT with aspirin and ticagrelor at least 12 months without interruption, consider elective staged CTO-PCI of the LAD. Case reviewed with Dr Martinique.  ECHOCARDIOGRAM COMPLETE  Result Date: 10/17/2019    ECHOCARDIOGRAM REPORT   Patient Name:   William Crawford Date of Exam: 10/17/2019 Medical Rec #:  [id number]         Height:       68.0 in Accession #:    [id number]        Weight:       179.0 lb Date of Birth:  1970-11-24         BSA:          1.950 m Patient Age:    33 years          BP:           179/115 mmHg Patient Gender: M                 HR:           104 bpm. Exam Location:  Inpatient Procedure: 2D Echo STAT ECHO Indications:    Chest Pain 786.50 / R07.9  History:        Patient has no prior history of Echocardiogram examinations.                 Signs/Symptoms:Shortness of Breath; Risk Factors:Family  History                 of Coronary Artery Disease and Current Smoker. ETOH use.  Sonographer:    Darlina Sicilian RDCS Referring Phys: [id number] [employer]  1. Left ventricular ejection fraction, by estimation, is 35 to 40%. The left ventricle has moderately decreased function. The left ventricle demonstrates regional wall motion abnormalities. Severe inferolateral hypokinesis, hypokinesis of the mid to apical inferior wall. Left ventricular diastolic parameters are consistent with Grade I diastolic dysfunction (impaired relaxation).  2. Right ventricular systolic function is normal. The right ventricular size is normal. Tricuspid regurgitation signal is inadequate for assessing PA pressure.  3. The mitral valve is normal in structure. No evidence of mitral valve regurgitation. No evidence of mitral stenosis.  4. The aortic valve is tricuspid. Aortic valve regurgitation is not visualized. No aortic stenosis is present.  5. The inferior vena cava is normal in size with greater than 50% respiratory variability, suggesting right atrial pressure of 3 mmHg. FINDINGS  Left Ventricle: Left ventricular ejection fraction, by estimation, is 35 to 40%. The left ventricle has moderately decreased function. The left ventricle demonstrates regional wall motion abnormalities. The left ventricular internal cavity size was normal in size. There is no left ventricular hypertrophy. Left ventricular diastolic parameters are consistent with Grade I diastolic dysfunction (impaired relaxation). Right Ventricle: The right ventricular size is normal. No increase in right ventricular wall thickness. Right ventricular systolic function is normal. Tricuspid regurgitation signal is inadequate for assessing PA pressure. Left Atrium: Left atrial size was normal in size. Right Atrium: Right atrial size was normal in size. Pericardium: There is no evidence of pericardial effusion. Mitral Valve: The mitral valve is normal in  structure. No evidence of mitral valve regurgitation. No evidence of mitral valve stenosis. Tricuspid Valve: The tricuspid valve is normal in structure. Tricuspid valve regurgitation is not demonstrated. Aortic Valve: The aortic valve is tricuspid. Aortic valve regurgitation is not visualized. No aortic stenosis is present. Pulmonic Valve: The pulmonic valve was normal in structure. Pulmonic valve regurgitation is not  visualized. Aorta: The aortic root is normal in size and structure. Venous: The inferior vena cava is normal in size with greater than 50% respiratory variability, suggesting right atrial pressure of 3 mmHg. IAS/Shunts: No atrial level shunt detected by color flow Doppler.  LEFT VENTRICLE PLAX 2D LVIDd:         4.80 cm      Diastology LVIDs:         3.70 cm      LV e' lateral:   4.22 cm/s LV PW:         1.30 cm      LV E/e' lateral: 17.6 LV IVS:        1.00 cm      LV e' medial:    3.89 cm/s LVOT diam:     2.00 cm      LV E/e' medial:  19.1 LV SV:         31 LV SV Index:   16 LVOT Area:     3.14 cm  LV Volumes (MOD) LV vol d, MOD A2C: 158.0 ml LV vol d, MOD A4C: 141.0 ml LV vol s, MOD A2C: 119.0 ml LV vol s, MOD A4C: 101.0 ml LV SV MOD A2C:     39.0 ml LV SV MOD A4C:     141.0 ml LV SV MOD BP:      40.5 ml RIGHT VENTRICLE RV S prime:     13.70 cm/s TAPSE (M-mode): 2.0 cm LEFT ATRIUM             Index       RIGHT ATRIUM           Index LA diam:        3.90 cm 2.00 cm/m  RA Area:     14.00 cm LA Vol (A2C):   30.9 ml 15.85 ml/m RA Volume:   35.60 ml  18.26 ml/m LA Vol (A4C):   23.1 ml 11.85 ml/m LA Biplane Vol: 27.3 ml 14.00 ml/m  AORTIC VALVE LVOT Vmax:   79.20 cm/s LVOT Vmean:  56.700 cm/s LVOT VTI:    0.099 m  AORTA Ao Root diam: 3.20 cm MITRAL VALVE MV Area (PHT): 4.21 cm    SHUNTS MV Decel Time: 180 msec    Systemic VTI:  0.10 m MV E velocity: 74.40 cm/s  Systemic Diam: 2.00 cm MV A velocity: 86.40 cm/s MV E/A ratio:  0.86 Loralie Champagne MD Electronically signed by Loralie Champagne MD Signature  Date/Time: 10/17/2019/12:20:06 PM    Final    Disposition   Pt is being discharged home today in good condition.  Follow-up Plans & Appointments    Follow-up Information    [place], [place], [place]. Go on 11/02/2019.   Specialty: Cardiology Why: @3 :15pm for hospital follow up  Contact information: [address] [place] [address] [phone]        [facility] Follow up on 10/25/2019.   Why: You cath is scheduled for 8:30 please arrive by 6:30am to check in Contact information: [place] [address] [phone]         Discharge Instructions    Amb Referral to Cardiac Rehabilitation   Complete by: As directed    Diagnosis:  NSTEMI PTCA Coronary Stents     After initial evaluation and assessments completed: Virtual Based Care may be provided alone or in conjunction with Phase 2 Cardiac Rehab based on patient barriers.: Yes   Diet -  low sodium heart healthy   Complete by: As directed    Discharge instructions   Complete by: As directed    Radial Site Care Refer to this sheet in the next few weeks. These instructions provide you with information on caring for yourself after your procedure. Your caregiver may also give you more specific instructions. Your treatment has been planned according to current medical practices, but problems sometimes occur. Call your caregiver if you have any problems or questions after your procedure. HOME CARE INSTRUCTIONS You may shower the day after the procedure.Remove the bandage (dressing) and gently wash the site with plain soap and water.Gently pat the site dry.  Do not apply powder or lotion to the site.  Do not submerge the affected site in water for 3 to 5 days.  Inspect the site at least twice daily.  Do not flex or bend the affected arm for 24 hours.  No lifting over 5 pounds (2.3 kg) for 5 days after your procedure.  Do not drive home if you are discharged the same  day of the procedure. Have someone else drive you.  You may drive 24 hours after the procedure unless otherwise instructed by your caregiver.  What to expect: Any bruising will usually fade within 1 to 2 weeks.  Blood that collects in the tissue (hematoma) may be painful to the touch. It should usually decrease in size and tenderness within 1 to 2 weeks.  SEEK IMMEDIATE MEDICAL CARE IF: You have unusual pain at the radial site.  You have redness, warmth, swelling, or pain at the radial site.  You have drainage (other than a small amount of blood on the dressing).  You have chills.  You have a fever or persistent symptoms for more than 72 hours.  You have a fever and your symptoms suddenly get worse.  Your arm becomes pale, cool, tingly, or numb.  You have heavy bleeding from the site. Hold pressure on the site.   PLEASE DO NOT MISS ANY DOSES OF YOUR PLAVIX!!!!! Also keep a log of you blood pressures and bring back to your follow up appt. Please call the office with any questions.   Patients taking blood thinners should generally stay away from medicines like ibuprofen, Advil, Motrin, naproxen, and Aleve due to risk of stomach bleeding. You may take Tylenol as directed or talk to your primary doctor about alternatives.  For patients with congestive heart failure, we give them these special instructions:  1. Follow a low-salt diet and watch your fluid intake. In general, you should not be taking in more than 2 liters of fluid per day (no more than 8 glasses per day). Some patients are restricted to less than 1.5 liters of fluid per day (no more than 6 glasses per day). This includes sources of water in foods like soup, coffee, tea, milk, etc. 2. Weigh yourself on the same scale at same time of day and keep a log. 3. Call your doctor: (Anytime you feel any of the following symptoms)  - 3-4 pound weight gain in 1-2 days or 2 pounds overnight  - Shortness of breath, with or without a dry hacking  cough  - Swelling in the hands, feet or stomach  - If you have to sleep on extra pillows at night in order to breathe   IT IS IMPORTANT TO LET YOUR DOCTOR KNOW EARLY ON IF YOU ARE HAVING SYMPTOMS SO WE CAN HELP YOU!   Increase activity slowly   Complete  by: As directed       Discharge Medications   Allergies as of 10/18/2019   No Known Allergies     Medication List    TAKE these medications   aspirin 81 MG EC tablet Take 1 tablet (81 mg total) by mouth daily. Start taking on: Oct 19, 2019   atorvastatin 80 MG tablet Commonly known as: LIPITOR Take 1 tablet (80 mg total) by mouth daily. Start taking on: Oct 19, 2019   cetirizine 10 MG tablet Commonly known as: ZYRTEC Take 10 mg by mouth daily.   clopidogrel 75 MG tablet Commonly known as: PLAVIX Take 1 tablet (75 mg total) by mouth daily. Start taking on: Oct 19, 2019   furosemide 20 MG tablet Commonly known as: Lasix Take 1 tablet (20 mg total) by mouth daily as needed (for shortness of breath).   losartan 25 MG tablet Commonly known as: COZAAR Take 1 tablet (25 mg total) by mouth daily. Start taking on: Oct 19, 2019   nitroGLYCERIN 0.4 MG SL tablet Commonly known as: NITROSTAT Place 1 tablet (0.4 mg total) under the tongue every 5 (five) minutes x 3 doses as needed for chest pain.        Aspirin prescribed at discharge?  Yes High Intensity Statin Prescribed? (Lipitor 40-22m or Crestor 20-426m: Yes Beta Blocker Prescribed? No: bradycardia For EF <40%, was ACEI/ARB Prescribed? Yes ADP Receptor Inhibitor Prescribed? (i.e. Plavix etc.-Includes Medically Managed Patients): Yes For EF <40%, Aldosterone Inhibitor Prescribed? No: consider as outpatient Was EF assessed during THIS hospitalization? Yes Was Cardiac Rehab II ordered? (Included Medically managed Patients): Yes   Outstanding Labs/Studies   N/a  Duration of Discharge Encounter   Greater than 30 minutes including physician  time.  Signed, LiReino BellisP-C 10/18/2019, 1:53 PM

## 2019-10-18 NOTE — Progress Notes (Signed)
CARDIAC REHAB PHASE I   PRE:  Rate/Rhythm: NSR 79  BP:  Sitting: 112/76       SaO2: 95  MODE:  Ambulation: 470 ft  SaO2 : 97%  NSR 91  POST:  Rate/Rhythm: NSR 80  BP:  Sitting: 120/83       SaO2: 97  10:00 - 11:20  Pt received in bed, agrees to ambulate. Pt ambulated with steady gait, no complaints with walk. Pt back to chair with brake on and call bell at side. Pt already was provided with booklet on after heart attack. Reviewed MI, stent, recovery process, complice with all medications, especially Plavix/Asa. Reviewed use and storage of NTG.  Discussed risk factors of HTN and smoking. Encouraged heart healthy and low Na diets. Encouraged to carry Stent card. Provided information on smoking cessation; pt verbalizes desire to quit smoking. Reviewed activity restrictions and wound care of right wrist. Pt was [facility] to [facility]. Pt is aware that he cannot attend CRP2 until after the procedure for CTO of LAD. Pt verbalized understanding of education.  Lesly Rubenstein, MS, ACSM EP-C, [facility] 10/18/2019  11:08 AM

## 2019-10-18 NOTE — Progress Notes (Addendum)
CCMD notified RN of 9 second pause.  RN checked on pt, pt's asymptomatic, resting comfortably in bed. The saved strip looked like the leads were off before and after, unable to determine if it was really 9 second pause or it's the lead coming off.  RN notified Card on call. Will continue to monitor.   Updated: metoprolol was DC'ed by Card.   Updated: Card came up to assess pt, assured pt's ok, strip showed a lot of artifacts, did not think it's 9 second pause, Burt Knack, MD to be updated.

## 2019-10-18 NOTE — Care Management (Signed)
1157 10-18-19 Case Manager provided patient with 30 day free and co pay card for Entresto just in case the patient was started on it outpatient. No further needs from Case Manager at this time. Bethena Roys, RN,BSN Case Manager

## 2019-10-18 NOTE — Care Management (Signed)
1022 10-18-19 Benefits check submitted for Entresto cost. Case Manager will continue to follow for cost. Graves-Bigelow, Ocie Cornfield, RN,BSN Case Manager

## 2019-10-24 ENCOUNTER — Telehealth: Payer: Self-pay

## 2019-10-24 NOTE — Telephone Encounter (Signed)
Pt contacted pre-catheterization scheduled at [facility] for: Verified arrival time and place: [facility]) at: 6:30 am .    No solid food after midnight prior to cath, clear liquids until 5 AM day of procedure. Contrast allergy:  DO NOT TAKE ANY DIURETICS THE MORNING OF THE PROCEDURE... Lasix  AM meds can be  taken pre-cath with sip of water including: ASA 81 mg Plavix 75 mg    Confirmed patient has responsible adult to drive home post procedure and observe 24 hours after arriving home:   You are allowed ONE visitor in the waiting room during your procedure. Both you and your visitor must wear masks.      COVID-19 Pre-Screening Questions:  . In the past 7 to 10 days have you had a cough,  shortness of breath, headache, congestion, fever (100 or greater) body aches, chills, sore throat, or sudden loss of taste or sense of smell? NO . Have you been around anyone with known Covid 19 in the past 7 to 10 days? NO . Have you been around anyone who is awaiting Covid 19 test results in the past 7 to 10 days? NO . Have you been around anyone who has mentioned symptoms of Covid 19 within the past 7 to 10 days? NO  Per Anderson Malta at the cath lab re: repeat covid testing.. pt has been in quarantine per his verbal report to me since he has been d/c from the hospital 5/25 so we will not need to retest.

## 2019-10-25 ENCOUNTER — Encounter (HOSPITAL_COMMUNITY)
Admission: RE | Disposition: A | Discharge status: Home / Self Care | Payer: Self-pay | Source: Home / Self Care | Attending: Cardiothoracic Surgery

## 2019-10-25 ENCOUNTER — Ambulatory Visit (HOSPITAL_COMMUNITY): Payer: BLUE CROSS/BLUE SHIELD

## 2019-10-25 ENCOUNTER — Inpatient Hospital Stay (HOSPITAL_COMMUNITY)
Admission: RE | Admit: 2019-10-25 | Discharge: 2019-11-11 | DRG: 003 | Disposition: A | Discharge status: Home / Self Care | Payer: BLUE CROSS/BLUE SHIELD | Attending: Cardiothoracic Surgery | Admitting: Cardiothoracic Surgery

## 2019-10-25 ENCOUNTER — Ambulatory Visit (HOSPITAL_COMMUNITY): Payer: BLUE CROSS/BLUE SHIELD | Admitting: Certified Registered Nurse Anesthetist

## 2019-10-25 ENCOUNTER — Inpatient Hospital Stay (HOSPITAL_COMMUNITY): Payer: BLUE CROSS/BLUE SHIELD

## 2019-10-25 ENCOUNTER — Other Ambulatory Visit: Payer: Self-pay

## 2019-10-25 ENCOUNTER — Encounter (HOSPITAL_COMMUNITY): Payer: Self-pay | Admitting: Cardiology

## 2019-10-25 DIAGNOSIS — I313 Pericardial effusion (noninflammatory): Secondary | ICD-10-CM | POA: Diagnosis not present

## 2019-10-25 DIAGNOSIS — G8918 Other acute postprocedural pain: Secondary | ICD-10-CM

## 2019-10-25 DIAGNOSIS — G9340 Encephalopathy, unspecified: Secondary | ICD-10-CM | POA: Diagnosis not present

## 2019-10-25 DIAGNOSIS — Z9911 Dependence on respirator [ventilator] status: Secondary | ICD-10-CM | POA: Diagnosis not present

## 2019-10-25 DIAGNOSIS — I2582 Chronic total occlusion of coronary artery: Secondary | ICD-10-CM

## 2019-10-25 DIAGNOSIS — Z7902 Long term (current) use of antithrombotics/antiplatelets: Secondary | ICD-10-CM

## 2019-10-25 DIAGNOSIS — Z9889 Other specified postprocedural states: Secondary | ICD-10-CM

## 2019-10-25 DIAGNOSIS — J9811 Atelectasis: Secondary | ICD-10-CM | POA: Diagnosis not present

## 2019-10-25 DIAGNOSIS — I314 Cardiac tamponade: Secondary | ICD-10-CM | POA: Diagnosis present

## 2019-10-25 DIAGNOSIS — J9601 Acute respiratory failure with hypoxia: Secondary | ICD-10-CM

## 2019-10-25 DIAGNOSIS — I255 Ischemic cardiomyopathy: Secondary | ICD-10-CM | POA: Diagnosis not present

## 2019-10-25 DIAGNOSIS — Z95811 Presence of heart assist device: Secondary | ICD-10-CM | POA: Diagnosis not present

## 2019-10-25 DIAGNOSIS — J440 Chronic obstructive pulmonary disease with acute lower respiratory infection: Secondary | ICD-10-CM | POA: Diagnosis present

## 2019-10-25 DIAGNOSIS — I517 Cardiomegaly: Secondary | ICD-10-CM | POA: Diagnosis not present

## 2019-10-25 DIAGNOSIS — F1010 Alcohol abuse, uncomplicated: Secondary | ICD-10-CM | POA: Diagnosis not present

## 2019-10-25 DIAGNOSIS — T82818A Embolism of vascular prosthetic devices, implants and grafts, initial encounter: Secondary | ICD-10-CM | POA: Diagnosis not present

## 2019-10-25 DIAGNOSIS — E876 Hypokalemia: Secondary | ICD-10-CM | POA: Diagnosis not present

## 2019-10-25 DIAGNOSIS — I426 Alcoholic cardiomyopathy: Secondary | ICD-10-CM | POA: Diagnosis present

## 2019-10-25 DIAGNOSIS — Y840 Cardiac catheterization as the cause of abnormal reaction of the patient, or of later complication, without mention of misadventure at the time of the procedure: Secondary | ICD-10-CM | POA: Diagnosis not present

## 2019-10-25 DIAGNOSIS — I371 Nonrheumatic pulmonary valve insufficiency: Secondary | ICD-10-CM | POA: Diagnosis not present

## 2019-10-25 DIAGNOSIS — E43 Unspecified severe protein-calorie malnutrition: Secondary | ICD-10-CM | POA: Diagnosis present

## 2019-10-25 DIAGNOSIS — I252 Old myocardial infarction: Secondary | ICD-10-CM

## 2019-10-25 DIAGNOSIS — I5021 Acute systolic (congestive) heart failure: Secondary | ICD-10-CM | POA: Diagnosis not present

## 2019-10-25 DIAGNOSIS — Z79899 Other long term (current) drug therapy: Secondary | ICD-10-CM

## 2019-10-25 DIAGNOSIS — E872 Acidosis: Secondary | ICD-10-CM | POA: Diagnosis not present

## 2019-10-25 DIAGNOSIS — I469 Cardiac arrest, cause unspecified: Secondary | ICD-10-CM | POA: Diagnosis not present

## 2019-10-25 DIAGNOSIS — I110 Hypertensive heart disease with heart failure: Secondary | ICD-10-CM | POA: Diagnosis not present

## 2019-10-25 DIAGNOSIS — I219 Acute myocardial infarction, unspecified: Secondary | ICD-10-CM | POA: Diagnosis present

## 2019-10-25 DIAGNOSIS — Z20822 Contact with and (suspected) exposure to covid-19: Secondary | ICD-10-CM | POA: Diagnosis present

## 2019-10-25 DIAGNOSIS — Z978 Presence of other specified devices: Secondary | ICD-10-CM

## 2019-10-25 DIAGNOSIS — Z9289 Personal history of other medical treatment: Secondary | ICD-10-CM

## 2019-10-25 DIAGNOSIS — K219 Gastro-esophageal reflux disease without esophagitis: Secondary | ICD-10-CM | POA: Diagnosis present

## 2019-10-25 DIAGNOSIS — I462 Cardiac arrest due to underlying cardiac condition: Secondary | ICD-10-CM | POA: Diagnosis present

## 2019-10-25 DIAGNOSIS — Z720 Tobacco use: Secondary | ICD-10-CM | POA: Diagnosis present

## 2019-10-25 DIAGNOSIS — E785 Hyperlipidemia, unspecified: Secondary | ICD-10-CM | POA: Diagnosis present

## 2019-10-25 DIAGNOSIS — I4901 Ventricular fibrillation: Secondary | ICD-10-CM | POA: Diagnosis not present

## 2019-10-25 DIAGNOSIS — J189 Pneumonia, unspecified organism: Secondary | ICD-10-CM | POA: Diagnosis not present

## 2019-10-25 DIAGNOSIS — I214 Non-ST elevation (NSTEMI) myocardial infarction: Secondary | ICD-10-CM

## 2019-10-25 DIAGNOSIS — Z8249 Family history of ischemic heart disease and other diseases of the circulatory system: Secondary | ICD-10-CM

## 2019-10-25 DIAGNOSIS — Y95 Nosocomial condition: Secondary | ICD-10-CM | POA: Diagnosis not present

## 2019-10-25 DIAGNOSIS — T82868A Thrombosis of vascular prosthetic devices, implants and grafts, initial encounter: Secondary | ICD-10-CM | POA: Diagnosis not present

## 2019-10-25 DIAGNOSIS — I493 Ventricular premature depolarization: Secondary | ICD-10-CM | POA: Diagnosis not present

## 2019-10-25 DIAGNOSIS — K567 Ileus, unspecified: Secondary | ICD-10-CM | POA: Diagnosis not present

## 2019-10-25 DIAGNOSIS — Y848 Other medical procedures as the cause of abnormal reaction of the patient, or of later complication, without mention of misadventure at the time of the procedure: Secondary | ICD-10-CM | POA: Diagnosis not present

## 2019-10-25 DIAGNOSIS — I509 Heart failure, unspecified: Secondary | ICD-10-CM | POA: Diagnosis not present

## 2019-10-25 DIAGNOSIS — Z419 Encounter for procedure for purposes other than remedying health state, unspecified: Secondary | ICD-10-CM

## 2019-10-25 DIAGNOSIS — I10 Essential (primary) hypertension: Secondary | ICD-10-CM | POA: Diagnosis not present

## 2019-10-25 DIAGNOSIS — I9751 Accidental puncture and laceration of a circulatory system organ or structure during a circulatory system procedure: Principal | ICD-10-CM | POA: Diagnosis present

## 2019-10-25 DIAGNOSIS — L03114 Cellulitis of left upper limb: Secondary | ICD-10-CM | POA: Diagnosis not present

## 2019-10-25 DIAGNOSIS — J939 Pneumothorax, unspecified: Secondary | ICD-10-CM | POA: Diagnosis not present

## 2019-10-25 DIAGNOSIS — D689 Coagulation defect, unspecified: Secondary | ICD-10-CM | POA: Diagnosis present

## 2019-10-25 DIAGNOSIS — Z9689 Presence of other specified functional implants: Secondary | ICD-10-CM

## 2019-10-25 DIAGNOSIS — I25119 Atherosclerotic heart disease of native coronary artery with unspecified angina pectoris: Secondary | ICD-10-CM | POA: Diagnosis present

## 2019-10-25 DIAGNOSIS — I5082 Biventricular heart failure: Secondary | ICD-10-CM | POA: Diagnosis not present

## 2019-10-25 DIAGNOSIS — Z4659 Encounter for fitting and adjustment of other gastrointestinal appliance and device: Secondary | ICD-10-CM

## 2019-10-25 DIAGNOSIS — D62 Acute posthemorrhagic anemia: Secondary | ICD-10-CM | POA: Diagnosis not present

## 2019-10-25 DIAGNOSIS — I513 Intracardiac thrombosis, not elsewhere classified: Secondary | ICD-10-CM | POA: Diagnosis not present

## 2019-10-25 DIAGNOSIS — F17210 Nicotine dependence, cigarettes, uncomplicated: Secondary | ICD-10-CM | POA: Diagnosis present

## 2019-10-25 DIAGNOSIS — Z7982 Long term (current) use of aspirin: Secondary | ICD-10-CM

## 2019-10-25 DIAGNOSIS — J90 Pleural effusion, not elsewhere classified: Secondary | ICD-10-CM | POA: Diagnosis not present

## 2019-10-25 DIAGNOSIS — Y92234 Operating room of hospital as the place of occurrence of the external cause: Secondary | ICD-10-CM | POA: Diagnosis not present

## 2019-10-25 DIAGNOSIS — Z48812 Encounter for surgical aftercare following surgery on the circulatory system: Secondary | ICD-10-CM | POA: Diagnosis not present

## 2019-10-25 DIAGNOSIS — Z9281 Personal history of extracorporeal membrane oxygenation (ECMO): Secondary | ICD-10-CM

## 2019-10-25 DIAGNOSIS — I5023 Acute on chronic systolic (congestive) heart failure: Secondary | ICD-10-CM | POA: Diagnosis present

## 2019-10-25 DIAGNOSIS — I2510 Atherosclerotic heart disease of native coronary artery without angina pectoris: Secondary | ICD-10-CM | POA: Diagnosis not present

## 2019-10-25 DIAGNOSIS — Z0189 Encounter for other specified special examinations: Secondary | ICD-10-CM

## 2019-10-25 DIAGNOSIS — Z4682 Encounter for fitting and adjustment of non-vascular catheter: Secondary | ICD-10-CM | POA: Diagnosis not present

## 2019-10-25 DIAGNOSIS — R570 Cardiogenic shock: Secondary | ICD-10-CM | POA: Diagnosis present

## 2019-10-25 DIAGNOSIS — I209 Angina pectoris, unspecified: Secondary | ICD-10-CM | POA: Diagnosis present

## 2019-10-25 DIAGNOSIS — Z955 Presence of coronary angioplasty implant and graft: Secondary | ICD-10-CM

## 2019-10-25 DIAGNOSIS — J811 Chronic pulmonary edema: Secondary | ICD-10-CM

## 2019-10-25 DIAGNOSIS — S299XXA Unspecified injury of thorax, initial encounter: Secondary | ICD-10-CM | POA: Diagnosis not present

## 2019-10-25 DIAGNOSIS — I083 Combined rheumatic disorders of mitral, aortic and tricuspid valves: Secondary | ICD-10-CM | POA: Diagnosis not present

## 2019-10-25 HISTORY — PX: CHEST EXPLORATION: SHX5104

## 2019-10-25 HISTORY — PX: CORONARY CTO INTERVENTION: CATH118236

## 2019-10-25 HISTORY — PX: MEDIASTERNOTOMY: SHX5084

## 2019-10-25 HISTORY — PX: CANNULATION FOR ECMO (EXTRACORPOREAL MEMBRANE OXYGENATION): SHX6796

## 2019-10-25 HISTORY — PX: TEE WITHOUT CARDIOVERSION: SHX5443

## 2019-10-25 HISTORY — PX: APPLICATION OF WOUND VAC: SHX5189

## 2019-10-25 LAB — COMPREHENSIVE METABOLIC PANEL
ALT: 87 U/L — ABNORMAL HIGH (ref 0–44)
AST: 222 U/L — ABNORMAL HIGH (ref 15–41)
Albumin: 2.7 g/dL — ABNORMAL LOW (ref 3.5–5.0)
Alkaline Phosphatase: 38 U/L (ref 38–126)
Anion gap: 7 (ref 5–15)
BUN: 9 mg/dL (ref 6–20)
CO2: 25 mmol/L (ref 22–32)
Calcium: 7.3 mg/dL — ABNORMAL LOW (ref 8.9–10.3)
Chloride: 110 mmol/L (ref 98–111)
Creatinine, Ser: 0.99 mg/dL (ref 0.61–1.24)
GFR calc Af Amer: >60 mL/min (ref >60)
GFR calc non Af Amer: >60 mL/min (ref >60)
Glucose, Bld: 136 mg/dL — ABNORMAL HIGH (ref 70–99)
Potassium: 4.3 mmol/L (ref 3.5–5.1)
Sodium: 142 mmol/L (ref 135–145)
Total Bilirubin: 1.3 mg/dL — ABNORMAL HIGH (ref 0.3–1.2)
Total Protein: 4.6 g/dL — ABNORMAL LOW (ref 6.5–8.1)

## 2019-10-25 LAB — BPAM CRYOPRECIPITATE
Blood Product Expiration Date: 202106022012
Blood Product Expiration Date: 202106022012
ISSUE DATE / TIME: 202106021435
ISSUE DATE / TIME: 202106021435
Unit Type and Rh: 6200
Unit Type and Rh: 6200

## 2019-10-25 LAB — POCT I-STAT 7, (LYTES, BLD GAS, ICA,H+H)
Acid-Base Excess: 2 mmol/L (ref 0.0–2.0)
Bicarbonate: 27.4 mmol/L (ref 20.0–28.0)
Calcium, Ion: 1.09 mmol/L — ABNORMAL LOW (ref 1.15–1.40)
HCT: 20 % — ABNORMAL LOW (ref 39.0–52.0)
Hemoglobin: 6.8 g/dL — CL (ref 13.0–17.0)
O2 Saturation: 100 %
Patient temperature: 36.1
Potassium: 3.8 mmol/L (ref 3.5–5.1)
Sodium: 146 mmol/L — ABNORMAL HIGH (ref 135–145)
TCO2: 29 mmol/L (ref 22–32)
pCO2 arterial: 47 mmHg (ref 32.0–48.0)
pH, Arterial: 7.37 (ref 7.350–7.450)
pO2, Arterial: 465 mmHg — ABNORMAL HIGH (ref 83.0–108.0)

## 2019-10-25 LAB — PLATELET COUNT: Platelets: 249 10*3/uL (ref 150–400)

## 2019-10-25 LAB — COOXEMETRY PANEL
Carboxyhemoglobin: 1.1 % (ref 0.5–1.5)
Methemoglobin: 0.8 % (ref 0.0–1.5)
O2 Saturation: 70.1 %
Total hemoglobin: 8.3 g/dL — ABNORMAL LOW (ref 12.0–16.0)

## 2019-10-25 LAB — PROTIME-INR
INR: 1.2 (ref 0.8–1.2)
Prothrombin Time: 15.1 seconds (ref 11.4–15.2)

## 2019-10-25 LAB — CBC
HCT: 23.5 % — ABNORMAL LOW (ref 39.0–52.0)
Hemoglobin: 7.8 g/dL — ABNORMAL LOW (ref 13.0–17.0)
MCH: 30.2 pg (ref 26.0–34.0)
MCHC: 33.2 g/dL (ref 30.0–36.0)
MCV: 91.1 fL (ref 80.0–100.0)
Platelets: 216 10*3/uL (ref 150–400)
RBC: 2.58 MIL/uL — ABNORMAL LOW (ref 4.22–5.81)
RDW: 12.1 % (ref 11.5–15.5)
WBC: 7.3 10*3/uL (ref 4.0–10.5)
nRBC: 0 % (ref 0.0–0.2)

## 2019-10-25 LAB — PREPARE RBC (CROSSMATCH)

## 2019-10-25 LAB — LACTATE DEHYDROGENASE: LDH: 567 U/L — ABNORMAL HIGH (ref 98–192)

## 2019-10-25 LAB — POCT ACTIVATED CLOTTING TIME
Activated Clotting Time: 312 seconds
Activated Clotting Time: 279 seconds
Activated Clotting Time: 301 seconds
Activated Clotting Time: 318 seconds
Activated Clotting Time: 252 seconds
Activated Clotting Time: 285 seconds

## 2019-10-25 LAB — ECHOCARDIOGRAM LIMITED
Height: 68.5 in
Weight: 2960 oz

## 2019-10-25 LAB — LACTIC ACID, PLASMA
Lactic Acid, Venous: 4.5 mmol/L (ref 0.5–1.9)
Lactic Acid, Venous: 3.2 mmol/L (ref 0.5–1.9)

## 2019-10-25 LAB — HEMOGLOBIN AND HEMATOCRIT, BLOOD
HCT: 21.4 % — ABNORMAL LOW (ref 39.0–52.0)
Hemoglobin: 7 g/dL — ABNORMAL LOW (ref 13.0–17.0)

## 2019-10-25 LAB — PREPARE CRYOPRECIPITATE
Unit division: 0
Unit division: 0

## 2019-10-25 LAB — ABO/RH: ABO/RH(D): A POS

## 2019-10-25 LAB — HEPARIN LEVEL (UNFRACTIONATED): Heparin Unfractionated: 0.1 IU/mL — ABNORMAL LOW (ref 0.30–0.70)

## 2019-10-25 LAB — FIBRINOGEN: Fibrinogen: 302 mg/dL (ref 210–475)

## 2019-10-25 LAB — APTT: aPTT: 47 seconds — ABNORMAL HIGH (ref 24–36)

## 2019-10-25 SURGERY — ECHOCARDIOGRAM, TRANSESOPHAGEAL
Anesthesia: General | Site: Chest

## 2019-10-25 SURGERY — CORONARY CTO INTERVENTION
Anesthesia: LOCAL

## 2019-10-25 MED ORDER — SODIUM CHLORIDE 0.9 % IV SOLN
1.5000 g | INTRAVENOUS | Status: AC
  Administered 2019-10-25: 1.5 g via INTRAVENOUS
  Filled 2019-10-25: qty 1.5

## 2019-10-25 MED ORDER — VANCOMYCIN HCL IN DEXTROSE 1-5 GM/200ML-% IV SOLN: 1000.0000 mg | Freq: Once | INTRAVENOUS | Status: DC

## 2019-10-25 MED ORDER — MORPHINE SULFATE (PF) 2 MG/ML IV SOLN: 1.0000 mg | INTRAVENOUS | Status: DC | PRN

## 2019-10-25 MED ORDER — VASOPRESSIN 20 UNIT/ML IV SOLN
INTRAVENOUS | Status: AC
  Filled 2019-10-25: qty 1

## 2019-10-25 MED ORDER — SODIUM CHLORIDE 0.9% IV SOLUTION: Freq: Once | INTRAVENOUS | Status: DC

## 2019-10-25 MED ORDER — PANTOPRAZOLE SODIUM 40 MG IV SOLR
40.0000 mg | Freq: Every day | INTRAVENOUS | Status: DC
  Administered 2019-10-25 – 2019-11-06 (×13): 40 mg via INTRAVENOUS
  Filled 2019-10-25 (×13): qty 40

## 2019-10-25 MED ORDER — HEMOSTATIC AGENTS (NO CHARGE) OPTIME
TOPICAL | Status: DC | PRN
  Administered 2019-10-25: 3 via TOPICAL
  Administered 2019-10-25: 2 via TOPICAL

## 2019-10-25 MED ORDER — ACETAMINOPHEN 500 MG PO TABS
1000.0000 mg | ORAL_TABLET | Freq: Four times a day (QID) | ORAL | Status: AC
  Administered 2019-10-27 – 2019-10-28 (×2): 1000 mg via ORAL
  Filled 2019-10-25: qty 2

## 2019-10-25 MED ORDER — EPINEPHRINE HCL 5 MG/250ML IV SOLN IN NS
0.0000 ug/min | INTRAVENOUS | Status: AC
  Administered 2019-10-25: 4 ug/min via INTRAVENOUS
  Filled 2019-10-25: qty 250

## 2019-10-25 MED ORDER — DEXMEDETOMIDINE HCL IN NACL 400 MCG/100ML IV SOLN
0.0000 ug/kg/h | INTRAVENOUS | Status: DC
  Administered 2019-10-25 – 2019-11-01 (×24): 0.7 ug/kg/h via INTRAVENOUS
  Administered 2019-11-01: 0.6 ug/kg/h via INTRAVENOUS
  Administered 2019-11-01 – 2019-11-03 (×6): 0.7 ug/kg/h via INTRAVENOUS
  Administered 2019-11-03: 0.6 ug/kg/h via INTRAVENOUS
  Filled 2019-10-25 (×5): qty 100
  Filled 2019-10-25: qty 200
  Filled 2019-10-25 (×24): qty 100

## 2019-10-25 MED ORDER — HEPARIN (PORCINE) IN NACL 1000-0.9 UT/500ML-% IV SOLN
INTRAVENOUS | Status: DC | PRN
  Administered 2019-10-25: 500 mL

## 2019-10-25 MED ORDER — CHLORHEXIDINE GLUCONATE CLOTH 2 % EX PADS: 6.0000 | MEDICATED_PAD | Freq: Once | CUTANEOUS | Status: DC

## 2019-10-25 MED ORDER — LACTATED RINGERS IV SOLN: INTRAVENOUS | Status: DC | PRN

## 2019-10-25 MED ORDER — PROPOFOL 10 MG/ML IV BOLUS
INTRAVENOUS | Status: AC
  Filled 2019-10-25: qty 20

## 2019-10-25 MED ORDER — LIDOCAINE HCL (PF) 1 % IJ SOLN
INTRAMUSCULAR | Status: DC | PRN
  Administered 2019-10-25: 15 mL
  Administered 2019-10-25: 2 mL
  Administered 2019-10-25: 15 mL

## 2019-10-25 MED ORDER — TRAMADOL HCL 50 MG PO TABS: 50.0000 mg | ORAL_TABLET | ORAL | Status: DC | PRN

## 2019-10-25 MED ORDER — VANCOMYCIN HCL IN DEXTROSE 1-5 GM/200ML-% IV SOLN
1000.0000 mg | Freq: Once | INTRAVENOUS | Status: AC
  Administered 2019-10-25: 1000 mg via INTRAVENOUS
  Filled 2019-10-25: qty 200

## 2019-10-25 MED ORDER — SUCCINYLCHOLINE CHLORIDE 20 MG/ML IJ SOLN
INTRAMUSCULAR | Status: DC | PRN
  Administered 2019-10-25: 120 mg via INTRAVENOUS

## 2019-10-25 MED ORDER — ONDANSETRON HCL 4 MG/2ML IJ SOLN
INTRAMUSCULAR | Status: DC | PRN
  Administered 2019-10-25: 4 mg via INTRAVENOUS

## 2019-10-25 MED ORDER — MAGNESIUM SULFATE 50 % IJ SOLN
40.0000 meq | INTRAMUSCULAR | Status: DC
  Filled 2019-10-25: qty 9.85

## 2019-10-25 MED ORDER — LACTATED RINGERS IV SOLN: 500.0000 mL | Freq: Once | INTRAVENOUS | Status: DC | PRN

## 2019-10-25 MED ORDER — SODIUM CHLORIDE 0.9% FLUSH: 3.0000 mL | Freq: Two times a day (BID) | INTRAVENOUS | Status: DC

## 2019-10-25 MED ORDER — NOREPINEPHRINE 4 MG/250ML-% IV SOLN
INTRAVENOUS | Status: AC
  Filled 2019-10-25: qty 250

## 2019-10-25 MED ORDER — ONDANSETRON HCL 4 MG/2ML IJ SOLN
4.0000 mg | Freq: Four times a day (QID) | INTRAMUSCULAR | Status: DC | PRN
  Administered 2019-11-02: 4 mg via INTRAVENOUS

## 2019-10-25 MED ORDER — INSULIN REGULAR(HUMAN) IN NACL 100-0.9 UT/100ML-% IV SOLN
INTRAVENOUS | Status: AC
  Administered 2019-10-25: 5.5 [IU]/h via INTRAVENOUS
  Filled 2019-10-25: qty 100

## 2019-10-25 MED ORDER — MAGNESIUM SULFATE 4 GM/100ML IV SOLN
4.0000 g | Freq: Once | INTRAVENOUS | Status: AC
  Administered 2019-10-25: 4 g via INTRAVENOUS
  Filled 2019-10-25: qty 100

## 2019-10-25 MED ORDER — SODIUM CHLORIDE 0.9 % IV SOLN
INTRAVENOUS | Status: AC | PRN
  Administered 2019-10-25: 10 mL/h via INTRAVENOUS

## 2019-10-25 MED ORDER — VERAPAMIL HCL 2.5 MG/ML IV SOLN
INTRAVENOUS | Status: DC | PRN
  Administered 2019-10-25: 10 mL via INTRA_ARTERIAL

## 2019-10-25 MED ORDER — VASOPRESSIN 20 UNIT/ML IV SOLN
INTRAVENOUS | Status: DC | PRN
  Administered 2019-10-25: 20 [IU] via INTRAVENOUS

## 2019-10-25 MED ORDER — SODIUM CHLORIDE 0.9% FLUSH
3.0000 mL | Freq: Two times a day (BID) | INTRAVENOUS | Status: DC
  Administered 2019-10-26 – 2019-11-03 (×9): 3 mL via INTRAVENOUS

## 2019-10-25 MED ORDER — EPINEPHRINE PF 1 MG/ML IJ SOLN
INTRAMUSCULAR | Status: DC | PRN
  Administered 2019-10-25 (×2): 1 mg via INTRAVENOUS

## 2019-10-25 MED ORDER — ONDANSETRON HCL 4 MG/2ML IJ SOLN
INTRAMUSCULAR | Status: AC
  Filled 2019-10-25: qty 2

## 2019-10-25 MED ORDER — PHENYLEPHRINE HCL-NACL 20-0.9 MG/250ML-% IV SOLN
30.0000 ug/min | INTRAVENOUS | Status: DC
  Filled 2019-10-25: qty 250

## 2019-10-25 MED ORDER — FENTANYL CITRATE (PF) 100 MCG/2ML IJ SOLN
INTRAMUSCULAR | Status: DC | PRN
  Administered 2019-10-25 (×6): 25 ug via INTRAVENOUS
  Administered 2019-10-25: 50 ug via INTRAVENOUS
  Administered 2019-10-25 (×3): 25 ug via INTRAVENOUS

## 2019-10-25 MED ORDER — FENTANYL CITRATE (PF) 250 MCG/5ML IJ SOLN
INTRAMUSCULAR | Status: DC | PRN
  Administered 2019-10-25 (×4): 50 ug via INTRAVENOUS

## 2019-10-25 MED ORDER — POTASSIUM CHLORIDE 10 MEQ/50ML IV SOLN
10.0000 meq | INTRAVENOUS | Status: AC
  Administered 2019-10-25 (×2): 10 meq via INTRAVENOUS
  Filled 2019-10-25: qty 50

## 2019-10-25 MED ORDER — EPINEPHRINE 1 MG/10ML IJ SOSY
PREFILLED_SYRINGE | INTRAMUSCULAR | Status: DC | PRN
  Administered 2019-10-25 (×2): .2 mg via INTRAVENOUS

## 2019-10-25 MED ORDER — CHLORHEXIDINE GLUCONATE 0.12 % MT SOLN
15.0000 mL | Freq: Once | OROMUCOSAL | Status: DC
  Filled 2019-10-25: qty 15

## 2019-10-25 MED ORDER — MIDAZOLAM HCL (PF) 10 MG/2ML IJ SOLN
INTRAMUSCULAR | Status: AC
  Filled 2019-10-25: qty 2

## 2019-10-25 MED ORDER — ATROPINE SULFATE 1 MG/10ML IJ SOSY
PREFILLED_SYRINGE | INTRAMUSCULAR | Status: DC | PRN
  Administered 2019-10-25: 1 mg via INTRAVENOUS

## 2019-10-25 MED ORDER — ACETAMINOPHEN 160 MG/5ML PO SOLN: 650.0000 mg | Freq: Once | ORAL | Status: AC

## 2019-10-25 MED ORDER — CALCIUM GLUCONATE 10 % IV SOLN
INTRAVENOUS | Status: DC | PRN
Start: 2019-10-25 — End: 2019-10-25

## 2019-10-25 MED ORDER — NITROGLYCERIN 1 MG/10 ML FOR IR/CATH LAB
INTRA_ARTERIAL | Status: AC
  Filled 2019-10-25: qty 10

## 2019-10-25 MED ORDER — MIDAZOLAM 50MG/50ML (1MG/ML) PREMIX INFUSION
0.5000 mg/h | INTRAVENOUS | Status: DC
  Administered 2019-10-25: 1 mg/h via INTRAVENOUS
  Administered 2019-10-26 – 2019-10-27 (×2): 2 mg/h via INTRAVENOUS
  Administered 2019-10-27: 4 mg/h via INTRAVENOUS
  Administered 2019-10-28: 3 mg/h via INTRAVENOUS
  Administered 2019-10-28 – 2019-10-30 (×4): 5 mg/h via INTRAVENOUS
  Administered 2019-10-30: 4 mg/h via INTRAVENOUS
  Administered 2019-10-31: 5 mg/h via INTRAVENOUS
  Administered 2019-11-01: 2 mg/h via INTRAVENOUS
  Administered 2019-11-01: 4 mg/h via INTRAVENOUS
  Administered 2019-11-02: 5 mg/h via INTRAVENOUS
  Administered 2019-11-02: 6 mg/h via INTRAVENOUS
  Administered 2019-11-02 – 2019-11-03 (×2): 5 mg/h via INTRAVENOUS
  Filled 2019-10-25 (×18): qty 50

## 2019-10-25 MED ORDER — POTASSIUM CHLORIDE 2 MEQ/ML IV SOLN
80.0000 meq | INTRAVENOUS | Status: DC
  Filled 2019-10-25: qty 40

## 2019-10-25 MED ORDER — SODIUM CHLORIDE 0.9 % WEIGHT BASED INFUSION
3.0000 mL/kg/h | INTRAVENOUS | Status: DC
  Administered 2019-10-25: 3 mL/kg/h via INTRAVENOUS

## 2019-10-25 MED ORDER — FAMOTIDINE IN NACL 20-0.9 MG/50ML-% IV SOLN
20.0000 mg | Freq: Two times a day (BID) | INTRAVENOUS | Status: AC
  Administered 2019-10-25 – 2019-10-26 (×2): 20 mg via INTRAVENOUS
  Filled 2019-10-25 (×2): qty 50

## 2019-10-25 MED ORDER — HEPARIN SODIUM (PORCINE) 1000 UNIT/ML IJ SOLN
INTRAMUSCULAR | Status: DC | PRN
  Administered 2019-10-25: 9000 [IU] via INTRAVENOUS
  Administered 2019-10-25 (×2): 2000 [IU] via INTRAVENOUS
  Administered 2019-10-25: 3000 [IU] via INTRAVENOUS
  Administered 2019-10-25: 2000 [IU] via INTRAVENOUS

## 2019-10-25 MED ORDER — METOPROLOL TARTRATE 12.5 MG HALF TABLET
12.5000 mg | ORAL_TABLET | Freq: Once | ORAL | Status: DC
  Filled 2019-10-25: qty 1

## 2019-10-25 MED ORDER — ACETAMINOPHEN 160 MG/5ML PO SOLN
1000.0000 mg | Freq: Four times a day (QID) | ORAL | Status: AC
  Administered 2019-10-26 – 2019-10-30 (×16): 1000 mg
  Filled 2019-10-25 (×15): qty 40.6

## 2019-10-25 MED ORDER — SODIUM CHLORIDE 0.9 % IV SOLN
INTRAVENOUS | Status: DC | PRN
Start: 2019-10-25 — End: 2019-10-25

## 2019-10-25 MED ORDER — ACETAMINOPHEN 650 MG RE SUPP
650.0000 mg | Freq: Once | RECTAL | Status: AC
  Administered 2019-10-31: 650 mg via RECTAL
  Filled 2019-10-25: qty 1

## 2019-10-25 MED ORDER — SODIUM CHLORIDE 0.45 % IV SOLN: INTRAVENOUS | Status: DC | PRN

## 2019-10-25 MED ORDER — TRANEXAMIC ACID (OHS) BOLUS VIA INFUSION
15.0000 mg/kg | INTRAVENOUS | Status: AC
  Administered 2019-10-25: 1258.5 mg via INTRAVENOUS
  Filled 2019-10-25: qty 1259

## 2019-10-25 MED ORDER — HEPARIN SODIUM (PORCINE) 1000 UNIT/ML IJ SOLN
INTRAMUSCULAR | Status: AC
  Filled 2019-10-25: qty 1

## 2019-10-25 MED ORDER — ONDANSETRON HCL 4 MG/2ML IJ SOLN
4.0000 mg | Freq: Four times a day (QID) | INTRAMUSCULAR | Status: DC | PRN
  Filled 2019-10-25: qty 2

## 2019-10-25 MED ORDER — NOREPINEPHRINE 4 MG/250ML-% IV SOLN
0.0000 ug/min | INTRAVENOUS | Status: DC
  Administered 2019-10-26: 5 ug/min via INTRAVENOUS
  Administered 2019-10-27: 1.5 ug/min via INTRAVENOUS
  Administered 2019-10-29: 4.5 ug/min via INTRAVENOUS
  Administered 2019-10-30: 3.5 ug/min via INTRAVENOUS
  Filled 2019-10-25 (×3): qty 250

## 2019-10-25 MED ORDER — SODIUM CHLORIDE 0.9 % IV SOLN
0.5000 mg/h | INTRAVENOUS | Status: DC
  Administered 2019-10-25: 0.5 mg/h via INTRAVENOUS
  Administered 2019-10-26: 2 mg/h via INTRAVENOUS
  Administered 2019-10-27: 3 mg/h via INTRAVENOUS
  Administered 2019-10-27: 4 mg/h via INTRAVENOUS
  Administered 2019-10-28: 2 mg/h via INTRAVENOUS
  Administered 2019-10-29 – 2019-10-30 (×3): 4 mg/h via INTRAVENOUS
  Filled 2019-10-25 (×10): qty 5

## 2019-10-25 MED ORDER — SODIUM CHLORIDE 0.9 % IV SOLN: INTRAVENOUS | Status: DC

## 2019-10-25 MED ORDER — HEPARIN (PORCINE) IN NACL 1000-0.9 UT/500ML-% IV SOLN
INTRAVENOUS | Status: DC | PRN
  Administered 2019-10-25 (×2): 500 mL

## 2019-10-25 MED ORDER — VANCOMYCIN HCL 1500 MG/300ML IV SOLN
1500.0000 mg | INTRAVENOUS | Status: AC
  Administered 2019-10-25: 1500 mg via INTRAVENOUS
  Filled 2019-10-25: qty 300

## 2019-10-25 MED ORDER — HEPARIN (PORCINE) IN NACL 1000-0.9 UT/500ML-% IV SOLN
INTRAVENOUS | Status: AC
  Filled 2019-10-25: qty 1500

## 2019-10-25 MED ORDER — FOLIC ACID 5 MG/ML IJ SOLN
1.0000 mg | Freq: Every day | INTRAMUSCULAR | Status: DC
  Administered 2019-10-25 – 2019-11-05 (×11): 1 mg via INTRAVENOUS
  Filled 2019-10-25 (×14): qty 0.2

## 2019-10-25 MED ORDER — TRANEXAMIC ACID (OHS) PUMP PRIME SOLUTION
2.0000 mg/kg | INTRAVENOUS | Status: DC
  Filled 2019-10-25: qty 1.68

## 2019-10-25 MED ORDER — VANCOMYCIN HCL IN DEXTROSE 1-5 GM/200ML-% IV SOLN
1000.0000 mg | Freq: Three times a day (TID) | INTRAVENOUS | Status: AC
  Administered 2019-10-26 – 2019-11-04 (×29): 1000 mg via INTRAVENOUS
  Filled 2019-10-25 (×27): qty 200

## 2019-10-25 MED ORDER — ALBUMIN HUMAN 5 % IV SOLN: 250.0000 mL | INTRAVENOUS | Status: AC | PRN

## 2019-10-25 MED ORDER — ASPIRIN 81 MG PO CHEW: 81.0000 mg | CHEWABLE_TABLET | ORAL | Status: DC

## 2019-10-25 MED ORDER — ROCURONIUM BROMIDE 10 MG/ML (PF) SYRINGE
PREFILLED_SYRINGE | INTRAVENOUS | Status: DC | PRN
  Administered 2019-10-25 (×2): 100 mg via INTRAVENOUS
  Administered 2019-10-25: 50 mg via INTRAVENOUS

## 2019-10-25 MED ORDER — FENTANYL CITRATE (PF) 100 MCG/2ML IJ SOLN
INTRAMUSCULAR | Status: AC
  Filled 2019-10-25: qty 2

## 2019-10-25 MED ORDER — FENTANYL CITRATE (PF) 250 MCG/5ML IJ SOLN
INTRAMUSCULAR | Status: AC
  Filled 2019-10-25: qty 25

## 2019-10-25 MED ORDER — DOCUSATE SODIUM 100 MG PO CAPS: 200.0000 mg | ORAL_CAPSULE | Freq: Every day | ORAL | Status: DC

## 2019-10-25 MED ORDER — LACTATED RINGERS IV SOLN: INTRAVENOUS | Status: DC

## 2019-10-25 MED ORDER — MIDAZOLAM HCL 2 MG/2ML IJ SOLN
INTRAMUSCULAR | Status: AC
  Filled 2019-10-25: qty 2

## 2019-10-25 MED ORDER — ASPIRIN 81 MG PO CHEW: 324.0000 mg | CHEWABLE_TABLET | Freq: Every day | ORAL | Status: DC

## 2019-10-25 MED ORDER — DEXTROSE 50 % IV SOLN: 0.0000 mL | INTRAVENOUS | Status: DC | PRN

## 2019-10-25 MED ORDER — BISACODYL 5 MG PO TBEC
10.0000 mg | DELAYED_RELEASE_TABLET | Freq: Every day | ORAL | Status: DC
  Administered 2019-11-01: 10 mg via ORAL
  Filled 2019-10-25: qty 2

## 2019-10-25 MED ORDER — ATROPINE SULFATE 1 MG/10ML IJ SOSY
PREFILLED_SYRINGE | INTRAMUSCULAR | Status: AC
  Filled 2019-10-25: qty 10

## 2019-10-25 MED ORDER — SODIUM CHLORIDE 0.9 % IV SOLN: 250.0000 mL | INTRAVENOUS | Status: DC | PRN

## 2019-10-25 MED ORDER — PLASMA-LYTE 148 IV SOLN
INTRAVENOUS | Status: DC
  Filled 2019-10-25: qty 2.5

## 2019-10-25 MED ORDER — SODIUM CHLORIDE 0.9% FLUSH: 3.0000 mL | INTRAVENOUS | Status: DC | PRN

## 2019-10-25 MED ORDER — ARTIFICIAL TEARS OPHTHALMIC OINT
TOPICAL_OINTMENT | OPHTHALMIC | Status: DC | PRN
  Administered 2019-10-25: 1 via OPHTHALMIC

## 2019-10-25 MED ORDER — DEXTROSE-NACL 5-0.45 % IV SOLN: INTRAVENOUS | Status: DC

## 2019-10-25 MED ORDER — HEPARIN (PORCINE) 25000 UT/250ML-% IV SOLN
1200.0000 [IU]/h | INTRAVENOUS | Status: DC
  Administered 2019-10-26: 500 [IU]/h via INTRAVENOUS
  Administered 2019-10-27: 1050 [IU]/h via INTRAVENOUS
  Filled 2019-10-25 (×3): qty 250

## 2019-10-25 MED ORDER — MILRINONE LACTATE IN DEXTROSE 20-5 MG/100ML-% IV SOLN
0.3000 ug/kg/min | INTRAVENOUS | Status: AC
  Administered 2019-10-25: .25 ug/kg/min via INTRAVENOUS
  Filled 2019-10-25: qty 100

## 2019-10-25 MED ORDER — CHLORHEXIDINE GLUCONATE 0.12 % MT SOLN: 15.0000 mL | OROMUCOSAL | Status: DC

## 2019-10-25 MED ORDER — ROCURONIUM BROMIDE 10 MG/ML (PF) SYRINGE
PREFILLED_SYRINGE | INTRAVENOUS | Status: AC
  Filled 2019-10-25: qty 30

## 2019-10-25 MED ORDER — MIDAZOLAM HCL 2 MG/2ML IJ SOLN
INTRAMUSCULAR | Status: DC | PRN
  Administered 2019-10-25: 2 mg via INTRAVENOUS
  Administered 2019-10-25 (×5): 1 mg via INTRAVENOUS

## 2019-10-25 MED ORDER — BISACODYL 5 MG PO TBEC: 5.0000 mg | DELAYED_RELEASE_TABLET | Freq: Once | ORAL | Status: DC

## 2019-10-25 MED ORDER — SODIUM CHLORIDE 0.9 % IV SOLN
INTRAVENOUS | Status: DC
  Filled 2019-10-25: qty 30

## 2019-10-25 MED ORDER — NOREPINEPHRINE BITARTRATE 1 MG/ML IV SOLN
INTRAVENOUS | Status: AC | PRN
  Administered 2019-10-25: 10 ug/min via INTRAVENOUS

## 2019-10-25 MED ORDER — INSULIN REGULAR(HUMAN) IN NACL 100-0.9 UT/100ML-% IV SOLN: INTRAVENOUS | Status: DC

## 2019-10-25 MED ORDER — PROPOFOL 10 MG/ML IV BOLUS
INTRAVENOUS | Status: DC | PRN
  Administered 2019-10-25: 30 mg via INTRAVENOUS

## 2019-10-25 MED ORDER — ALBUMIN HUMAN 5 % IV SOLN
INTRAVENOUS | Status: DC | PRN
Start: 2019-10-25 — End: 2019-10-25

## 2019-10-25 MED ORDER — HYDROMORPHONE BOLUS VIA INFUSION
1.0000 mg | INTRAVENOUS | Status: DC | PRN
  Administered 2019-10-27 – 2019-10-28 (×3): 1 mg via INTRAVENOUS
  Administered 2019-10-28: 2 mg via INTRAVENOUS
  Administered 2019-10-28 – 2019-10-30 (×9): 1 mg via INTRAVENOUS
  Filled 2019-10-25: qty 1

## 2019-10-25 MED ORDER — SODIUM CHLORIDE 0.9 % IV SOLN
INTRAVENOUS | Status: DC | PRN
  Administered 2019-10-25: 10 mL/h via INTRAVENOUS

## 2019-10-25 MED ORDER — MIDAZOLAM HCL 2 MG/2ML IJ SOLN
2.0000 mg | INTRAMUSCULAR | Status: DC | PRN
  Administered 2019-10-25 (×2): 2 mg via INTRAVENOUS
  Administered 2019-10-28: 4 mg via INTRAVENOUS
  Administered 2019-10-28 – 2019-11-03 (×11): 2 mg via INTRAVENOUS
  Filled 2019-10-25 (×8): qty 2

## 2019-10-25 MED ORDER — 0.9 % SODIUM CHLORIDE (POUR BTL) OPTIME
TOPICAL | Status: DC | PRN
  Administered 2019-10-25 (×3): 1000 mL

## 2019-10-25 MED ORDER — PLASMA-LYTE 148 IV SOLN
INTRAVENOUS | Status: DC | PRN
  Administered 2019-10-25: 500 mL

## 2019-10-25 MED ORDER — EPINEPHRINE HCL 5 MG/250ML IV SOLN IN NS
2.0000 ug/min | INTRAVENOUS | Status: DC
  Administered 2019-10-25 – 2019-10-29 (×3): 1 ug/min via INTRAVENOUS
  Administered 2019-10-30 – 2019-10-31 (×2): 3 ug/min via INTRAVENOUS
  Filled 2019-10-25 (×4): qty 250

## 2019-10-25 MED ORDER — PHENYLEPHRINE HCL-NACL 20-0.9 MG/250ML-% IV SOLN: 0.0000 ug/min | INTRAVENOUS | Status: DC

## 2019-10-25 MED ORDER — ASPIRIN EC 325 MG PO TBEC: 325.0000 mg | DELAYED_RELEASE_TABLET | Freq: Every day | ORAL | Status: DC

## 2019-10-25 MED ORDER — VERAPAMIL HCL 2.5 MG/ML IV SOLN
INTRAVENOUS | Status: AC
  Filled 2019-10-25: qty 2

## 2019-10-25 MED ORDER — PANTOPRAZOLE SODIUM 40 MG PO TBEC: 40.0000 mg | DELAYED_RELEASE_TABLET | Freq: Every day | ORAL | Status: DC

## 2019-10-25 MED ORDER — NITROGLYCERIN 1 MG/10 ML FOR IR/CATH LAB
INTRA_ARTERIAL | Status: DC | PRN
  Administered 2019-10-25: 200 ug via INTRACORONARY

## 2019-10-25 MED ORDER — ALBUMIN HUMAN 5 % IV SOLN
12.5000 g | Freq: Once | INTRAVENOUS | Status: AC
  Administered 2019-10-25: 12.5 g via INTRAVENOUS

## 2019-10-25 MED ORDER — TEMAZEPAM 15 MG PO CAPS: 15.0000 mg | ORAL_CAPSULE | Freq: Once | ORAL | Status: DC | PRN

## 2019-10-25 MED ORDER — THIAMINE HCL 100 MG/ML IJ SOLN: 100.0000 mg | Freq: Every day | INTRAMUSCULAR | Status: DC

## 2019-10-25 MED ORDER — SODIUM CHLORIDE 0.9 % WEIGHT BASED INFUSION
1.0000 mL/kg/h | INTRAVENOUS | Status: DC
  Administered 2019-10-25 (×2): 250 mL via INTRAVENOUS

## 2019-10-25 MED ORDER — MILRINONE LACTATE IN DEXTROSE 20-5 MG/100ML-% IV SOLN
0.1250 ug/kg/min | INTRAVENOUS | Status: DC
  Administered 2019-10-25 – 2019-11-05 (×24): 0.375 ug/kg/min via INTRAVENOUS
  Administered 2019-11-06: 0.25 ug/kg/min via INTRAVENOUS
  Administered 2019-11-06: 0.375 ug/kg/min via INTRAVENOUS
  Administered 2019-11-07: 0.125 ug/kg/min via INTRAVENOUS
  Filled 2019-10-25 (×27): qty 100

## 2019-10-25 MED ORDER — SODIUM CHLORIDE 0.9 % IV SOLN: 10.0000 mL/h | Freq: Once | INTRAVENOUS | Status: DC

## 2019-10-25 MED ORDER — CALCIUM CHLORIDE 10 % IV SOLN
INTRAVENOUS | Status: DC | PRN
  Administered 2019-10-25: 1 g via INTRAVENOUS

## 2019-10-25 MED ORDER — VASOPRESSIN 20 UNIT/ML IV SOLN
0.0300 [IU]/min | INTRAVENOUS | Status: AC
  Administered 2019-10-25: .03 [IU]/min via INTRAVENOUS
  Filled 2019-10-25: qty 2

## 2019-10-25 MED ORDER — EPINEPHRINE 1 MG/10ML IJ SOSY
PREFILLED_SYRINGE | INTRAMUSCULAR | Status: AC
  Filled 2019-10-25: qty 10

## 2019-10-25 MED ORDER — OXYCODONE HCL 5 MG PO TABS: 5.0000 mg | ORAL_TABLET | ORAL | Status: DC | PRN

## 2019-10-25 MED ORDER — BISACODYL 10 MG RE SUPP
10.0000 mg | Freq: Every day | RECTAL | Status: DC
  Administered 2019-10-26 – 2019-11-02 (×7): 10 mg via RECTAL
  Filled 2019-10-25 (×7): qty 1

## 2019-10-25 MED ORDER — EPINEPHRINE 1 MG/10ML IJ SOSY
PREFILLED_SYRINGE | INTRAMUSCULAR | Status: DC | PRN
  Administered 2019-10-25 (×2): 1 mg via INTRAVENOUS
  Administered 2019-10-25: .2 mg via INTRAVENOUS
  Administered 2019-10-25: .4 mg via INTRAVENOUS

## 2019-10-25 MED ORDER — LIDOCAINE IN D5W 4-5 MG/ML-% IV SOLN
INTRAVENOUS | Status: DC | PRN
  Administered 2019-10-25: 2 mg/min via INTRAVENOUS

## 2019-10-25 MED ORDER — PHENYLEPHRINE 40 MCG/ML (10ML) SYRINGE FOR IV PUSH (FOR BLOOD PRESSURE SUPPORT)
PREFILLED_SYRINGE | INTRAVENOUS | Status: AC
  Filled 2019-10-25: qty 20

## 2019-10-25 MED ORDER — EPHEDRINE 5 MG/ML INJ
INTRAVENOUS | Status: AC
  Filled 2019-10-25: qty 10

## 2019-10-25 MED ORDER — EPINEPHRINE PF 1 MG/ML IJ SOLN: 0.0000 ug/min | INTRAVENOUS | Status: DC

## 2019-10-25 MED ORDER — SODIUM CHLORIDE 0.9 % IV SOLN: 1.5000 g | Freq: Two times a day (BID) | INTRAVENOUS | Status: DC

## 2019-10-25 MED ORDER — NITROGLYCERIN IN D5W 200-5 MCG/ML-% IV SOLN: 0.0000 ug/min | INTRAVENOUS | Status: DC

## 2019-10-25 MED ORDER — HEPARIN SODIUM (PORCINE) 1000 UNIT/ML IJ SOLN
INTRAMUSCULAR | Status: DC | PRN
  Administered 2019-10-25: 25000 [IU] via INTRAVENOUS

## 2019-10-25 MED ORDER — METOPROLOL TARTRATE 25 MG/10 ML ORAL SUSPENSION: 12.5000 mg | Freq: Two times a day (BID) | ORAL | Status: DC

## 2019-10-25 MED ORDER — VASOPRESSIN 20 UNIT/ML IV SOLN
0.0200 [IU]/min | INTRAVENOUS | Status: DC
  Filled 2019-10-25: qty 2

## 2019-10-25 MED ORDER — SODIUM CHLORIDE 0.9 % IV SOLN
750.0000 mg | INTRAVENOUS | Status: AC
  Administered 2019-10-25: 750 mg via INTRAVENOUS
  Filled 2019-10-25: qty 750

## 2019-10-25 MED ORDER — NITROGLYCERIN IN D5W 200-5 MCG/ML-% IV SOLN
2.0000 ug/min | INTRAVENOUS | Status: DC
  Filled 2019-10-25: qty 250

## 2019-10-25 MED ORDER — SODIUM CHLORIDE 0.9 % IV SOLN: 250.0000 mL | INTRAVENOUS | Status: DC

## 2019-10-25 MED ORDER — TRANEXAMIC ACID 1000 MG/10ML IV SOLN
1.5000 mg/kg/h | INTRAVENOUS | Status: AC
  Administered 2019-10-25: 1.5 mg/kg/h via INTRAVENOUS
  Filled 2019-10-25: qty 25

## 2019-10-25 MED ORDER — METOPROLOL TARTRATE 5 MG/5ML IV SOLN: 2.5000 mg | INTRAVENOUS | Status: DC | PRN

## 2019-10-25 MED ORDER — LIDOCAINE IN D5W 4-5 MG/ML-% IV SOLN: 1.0000 mg/min | INTRAVENOUS | Status: DC

## 2019-10-25 MED ORDER — SODIUM CHLORIDE 0.9 % IV SOLN
2.0000 g | Freq: Three times a day (TID) | INTRAVENOUS | Status: DC
  Administered 2019-10-25 – 2019-10-31 (×17): 2 g via INTRAVENOUS
  Filled 2019-10-25 (×19): qty 2

## 2019-10-25 MED ORDER — IOHEXOL 350 MG/ML SOLN
INTRAVENOUS | Status: AC
  Filled 2019-10-25: qty 1

## 2019-10-25 MED ORDER — LIDOCAINE 2% (20 MG/ML) 5 ML SYRINGE
INTRAMUSCULAR | Status: DC | PRN
  Administered 2019-10-25: 100 mg via INTRAVENOUS

## 2019-10-25 MED ORDER — SODIUM CHLORIDE 0.9 % IV SOLN
INTRAVENOUS | Status: AC
  Filled 2019-10-25: qty 1.2

## 2019-10-25 MED ORDER — SODIUM CHLORIDE 0.45 % IV SOLN: INTRAVENOUS | Status: DC

## 2019-10-25 MED ORDER — MIDAZOLAM HCL 5 MG/5ML IJ SOLN
INTRAMUSCULAR | Status: DC | PRN
  Administered 2019-10-25 (×2): 5 mg via INTRAVENOUS

## 2019-10-25 MED ORDER — LIDOCAINE HCL (PF) 1 % IJ SOLN
INTRAMUSCULAR | Status: AC
  Filled 2019-10-25: qty 30

## 2019-10-25 MED ORDER — NOREPINEPHRINE 4 MG/250ML-% IV SOLN
0.0000 ug/min | INTRAVENOUS | Status: DC
  Filled 2019-10-25: qty 250

## 2019-10-25 MED ORDER — MILRINONE LACTATE IN DEXTROSE 20-5 MG/100ML-% IV SOLN: 0.3750 ug/kg/min | INTRAVENOUS | Status: DC

## 2019-10-25 MED ORDER — PROTAMINE SULFATE 10 MG/ML IV SOLN
INTRAVENOUS | Status: DC | PRN
  Administered 2019-10-25 (×4): 30 mg via INTRAVENOUS

## 2019-10-25 MED ORDER — HEPARIN (PORCINE) IN NACL 1000-0.9 UT/500ML-% IV SOLN
INTRAVENOUS | Status: AC
  Filled 2019-10-25: qty 500

## 2019-10-25 MED ORDER — LACTATED RINGERS IV SOLN
INTRAVENOUS | Status: DC | PRN
Start: 2019-10-25 — End: 2019-10-25

## 2019-10-25 MED ORDER — SUCCINYLCHOLINE CHLORIDE 200 MG/10ML IV SOSY
PREFILLED_SYRINGE | INTRAVENOUS | Status: AC
  Filled 2019-10-25: qty 10

## 2019-10-25 MED ORDER — DEXMEDETOMIDINE HCL IN NACL 400 MCG/100ML IV SOLN
0.1000 ug/kg/h | INTRAVENOUS | Status: AC
  Administered 2019-10-25: .7 ug/kg/h via INTRAVENOUS
  Filled 2019-10-25: qty 100

## 2019-10-25 MED ORDER — ETOMIDATE 2 MG/ML IV SOLN
INTRAVENOUS | Status: AC
  Filled 2019-10-25: qty 10

## 2019-10-25 MED ORDER — METOPROLOL TARTRATE 12.5 MG HALF TABLET: 12.5000 mg | ORAL_TABLET | Freq: Two times a day (BID) | ORAL | Status: DC

## 2019-10-25 SURGICAL SUPPLY — 95 items
ADAPTER CARDIO PERF ANTE/RETRO (ADAPTER) ×5 IMPLANT
ANCHOR CATH FOLEY SECURE (MISCELLANEOUS) ×4 IMPLANT
BAG DECANTER FOR FLEXI CONT (MISCELLANEOUS) ×5 IMPLANT
BANDAGE ESMARK 6X9 LF (GAUZE/BANDAGES/DRESSINGS) IMPLANT
BENZOIN TINCTURE PRP APPL 2/3 (GAUZE/BANDAGES/DRESSINGS) ×2 IMPLANT
BLADE STERNUM SYSTEM 6 (BLADE) ×5 IMPLANT
BLADE SURG 12 STRL SS (BLADE) ×5 IMPLANT
BNDG ESMARK 6X9 LF (GAUZE/BANDAGES/DRESSINGS) ×5
BNDG GAUZE ELAST 4 BULKY (GAUZE/BANDAGES/DRESSINGS) ×5 IMPLANT
CANISTER SUCT 3000ML PPV (MISCELLANEOUS) ×5 IMPLANT
CANNULA GUNDRY RCSP 15FR (MISCELLANEOUS) ×5 IMPLANT
CANNULA NON VENT 20FR 12 (CANNULA) ×2 IMPLANT
CATH CPB KIT VANTRIGT (MISCELLANEOUS) ×5 IMPLANT
CATH HEART VENT LEFT (CATHETERS) IMPLANT
CATH ROBINSON RED A/P 18FR (CATHETERS) ×15 IMPLANT
CATH THORACIC 28FR RT ANG (CATHETERS) ×5 IMPLANT
CLIP VESOCCLUDE LG 6/CT (CLIP) ×12 IMPLANT
CONN 3/8X1/2 ST GISH (MISCELLANEOUS) ×2 IMPLANT
CONN ST 1/4X3/8  BEN (MISCELLANEOUS) ×5
CONN ST 1/4X3/8 BEN (MISCELLANEOUS) IMPLANT
CONNECTOR 1/2X3/8X1/2 3 WAY (MISCELLANEOUS) ×5
CONNECTOR 1/2X3/8X1/2 3WAY (MISCELLANEOUS) IMPLANT
DERMABOND ADVANCED (GAUZE/BANDAGES/DRESSINGS) ×2
DERMABOND ADVANCED .7 DNX12 (GAUZE/BANDAGES/DRESSINGS) IMPLANT
DRAIN CHANNEL 28F RND 3/8 FF (WOUND CARE) ×2 IMPLANT
DRAIN CHANNEL 32F RND 10.7 FF (WOUND CARE) ×7 IMPLANT
DRAPE CARDIOVASCULAR INCISE (DRAPES) ×5
DRAPE INCISE IOBAN 66X45 STRL (DRAPES) ×2 IMPLANT
DRAPE SLUSH/WARMER DISC (DRAPES) ×5 IMPLANT
DRAPE SRG 135X102X78XABS (DRAPES) ×3 IMPLANT
DRAPE STERI IOBAN 125X83 (DRAPES) ×2 IMPLANT
DRSG AQUACEL AG ADV 3.5X14 (GAUZE/BANDAGES/DRESSINGS) ×5 IMPLANT
ELECT BLADE 4.0 EZ CLEAN MEGAD (MISCELLANEOUS) ×5
ELECT BLADE 6.5 EXT (BLADE) ×5 IMPLANT
ELECT CAUTERY BLADE 6.4 (BLADE) ×5 IMPLANT
ELECT REM PT RETURN 9FT ADLT (ELECTROSURGICAL) ×10
ELECTRODE BLDE 4.0 EZ CLN MEGD (MISCELLANEOUS) ×3 IMPLANT
ELECTRODE REM PT RTRN 9FT ADLT (ELECTROSURGICAL) ×6 IMPLANT
FELT TEFLON 1X6 (MISCELLANEOUS) ×8 IMPLANT
FLASHLIGHT (MISCELLANEOUS) ×2 IMPLANT
GAUZE SPONGE 4X4 12PLY STRL (GAUZE/BANDAGES/DRESSINGS) ×8 IMPLANT
GAUZE XEROFORM 5X9 LF (GAUZE/BANDAGES/DRESSINGS) ×2 IMPLANT
GLOVE BIO SURGEON STRL SZ7.5 (GLOVE) ×19 IMPLANT
GLOVE BIOGEL PI IND STRL 7.0 (GLOVE) IMPLANT
GLOVE BIOGEL PI IND STRL 7.5 (GLOVE) IMPLANT
GLOVE BIOGEL PI INDICATOR 7.0 (GLOVE) ×2
GLOVE BIOGEL PI INDICATOR 7.5 (GLOVE) ×2
GOWN STRL REUS W/ TWL LRG LVL3 (GOWN DISPOSABLE) ×12 IMPLANT
GOWN STRL REUS W/TWL LRG LVL3 (GOWN DISPOSABLE) ×50
HEMOSTAT POWDER SURGIFOAM 1G (HEMOSTASIS) ×15 IMPLANT
HEMOSTAT SURGICEL 2X14 (HEMOSTASIS) ×5 IMPLANT
KIT BASIN OR (CUSTOM PROCEDURE TRAY) ×5 IMPLANT
KIT CATH LUMEN 2 8FR 16CM STRL (SET/KITS/TRAYS/PACK) ×2 IMPLANT
KIT SUCTION CATH 14FR (SUCTIONS) ×5 IMPLANT
KIT TURNOVER KIT B (KITS) ×5 IMPLANT
LEAD PACING MYOCARDI (MISCELLANEOUS) ×5 IMPLANT
LINE VENT (MISCELLANEOUS) ×2 IMPLANT
MARKER GRAFT CORONARY BYPASS (MISCELLANEOUS) ×9 IMPLANT
NS IRRIG 1000ML POUR BTL (IV SOLUTION) ×27 IMPLANT
PACK E OPEN HEART (SUTURE) ×5 IMPLANT
PACK OPEN HEART (CUSTOM PROCEDURE TRAY) ×5 IMPLANT
PAD ARMBOARD 7.5X6 YLW CONV (MISCELLANEOUS) ×10 IMPLANT
PAD ELECT DEFIB RADIOL ZOLL (MISCELLANEOUS) ×5 IMPLANT
PENCIL BUTTON HOLSTER BLD 10FT (ELECTRODE) ×5 IMPLANT
POSITIONER HEAD DONUT 9IN (MISCELLANEOUS) ×5 IMPLANT
SET CARDIOPLEGIA MPS 5001102 (MISCELLANEOUS) ×2 IMPLANT
SPONGE ABDOMINAL VAC ABTHERA (MISCELLANEOUS) ×2 IMPLANT
SURGIFLO W/THROMBIN 8M KIT (HEMOSTASIS) ×5 IMPLANT
SUT BONE WAX W31G (SUTURE) ×5 IMPLANT
SUT ETHIBOND 2 0 SH (SUTURE) ×15
SUT ETHIBOND 2 0 SH 36X2 (SUTURE) IMPLANT
SUT PROLENE 2 0 MH 48 (SUTURE) ×10 IMPLANT
SUT PROLENE 3 0 RB 1 (SUTURE) ×2 IMPLANT
SUT PROLENE 4 0 RB 1 (SUTURE) ×15
SUT PROLENE 4 0 SH DA (SUTURE) ×7 IMPLANT
SUT PROLENE 4-0 RB1 .5 CRCL 36 (SUTURE) ×3 IMPLANT
SUT PROLENE 6 0 CC (SUTURE) ×15 IMPLANT
SUT PROLENE BLUE 7 0 (SUTURE) ×5 IMPLANT
SUT SILK  1 MH (SUTURE) ×5
SUT SILK 1 MH (SUTURE) IMPLANT
SUT SILK 1 TIES 10X30 (SUTURE) ×4 IMPLANT
SUT SILK 2 0 SH CR/8 (SUTURE) ×2 IMPLANT
SUT STEEL 6MS V (SUTURE) ×8 IMPLANT
SUT VIC AB 1 CTX 27 (SUTURE) ×10 IMPLANT
SUT VIC AB 1 CTX 36 (SUTURE) ×10
SUT VIC AB 1 CTX36XBRD ANBCTR (SUTURE) ×6 IMPLANT
SYSTEM SAHARA CHEST DRAIN ATS (WOUND CARE) ×5 IMPLANT
TAPE CLOTH SURG 4X10 WHT LF (GAUZE/BANDAGES/DRESSINGS) ×2 IMPLANT
TOWEL GREEN STERILE (TOWEL DISPOSABLE) ×5 IMPLANT
TOWEL GREEN STERILE FF (TOWEL DISPOSABLE) ×5 IMPLANT
TRAY CATH LUMEN 1 20CM STRL (SET/KITS/TRAYS/PACK) ×2 IMPLANT
TRAY FOLEY SLVR 16FR TEMP STAT (SET/KITS/TRAYS/PACK) ×5 IMPLANT
VENT LEFT HEART 12002 (CATHETERS) ×5
WATER STERILE IRR 1000ML POUR (IV SOLUTION) ×10 IMPLANT
YANKAUER SUCT BULB TIP NO VENT (SUCTIONS) ×2 IMPLANT

## 2019-10-25 SURGICAL SUPPLY — 35 items
BALLN EMERGE MR 2.0X20 (BALLOONS) ×2
BALLN EMERGE MR 2.5X20 (BALLOONS) ×2
BALLN EMERGE MR PUSH 1.5X15 (BALLOONS) ×2
BALLN SAPPHIRE 3.0X15 (BALLOONS) ×2
BALLOON EMERGE MR 2.0X20 (BALLOONS) IMPLANT
BALLOON EMERGE MR 2.5X20 (BALLOONS) IMPLANT
BALLOON EMERGE MR PUSH 1.5X15 (BALLOONS) IMPLANT
BALLOON SAPPHIRE 3.0X15 (BALLOONS) IMPLANT
CATH MACH1 8FR CLS3.5 (CATHETERS) ×1 IMPLANT
CATH MAMBA 135 (CATHETERS) ×1 IMPLANT
CATH VISTA GUIDE 6FR XBLAD3.5 (CATHETERS) ×1 IMPLANT
DEVICE RAD COMP TR BAND LRG (VASCULAR PRODUCTS) ×1 IMPLANT
ELECT DEFIB PAD ADLT CADENCE (PAD) ×1 IMPLANT
GLIDESHEATH SLEND SS 6F .021 (SHEATH) ×1 IMPLANT
GUIDEWIRE INQWIRE 1.5J.035X260 (WIRE) IMPLANT
GUIDEWIRE JUDO 3 190 (WIRE) ×1 IMPLANT
INQWIRE 1.5J .035X260CM (WIRE) ×2
KIT ENCORE 26 ADVANTAGE (KITS) ×2 IMPLANT
KIT HEART LEFT (KITS) ×2 IMPLANT
KIT HEMO VALVE WATCHDOG (MISCELLANEOUS) ×2 IMPLANT
PACK CARDIAC CATHETERIZATION (CUSTOM PROCEDURE TRAY) ×2 IMPLANT
PERIVAC PERICARDIOCENTESIS 8.3 (TRAY / TRAY PROCEDURE) ×1 IMPLANT
SHEATH BRITE TIP 8FR 35CM (SHEATH) ×1 IMPLANT
SHEATH PINNACLE 6F 10CM (SHEATH) ×1 IMPLANT
SHEATH PROBE COVER 6X72 (BAG) ×1 IMPLANT
STENT PK PAPYRUS 2.5X15 (Permanent Stent) ×1 IMPLANT
STENT PK PAPYRUS 2.5X20 (Permanent Stent) ×1 IMPLANT
TRANSDUCER W/STOPCOCK (MISCELLANEOUS) ×3 IMPLANT
TUBING CIL FLEX 10 FLL-RA (TUBING) ×2 IMPLANT
WIRE ASAHI MIRACLEBROS-3 180CM (WIRE) ×1 IMPLANT
WIRE ASAHI MIRACLEBROS-6 180CM (WIRE) ×1 IMPLANT
WIRE ASAHI PROWATER 180CM (WIRE) ×4 IMPLANT
WIRE EMERALD 3MM-J .035X150CM (WIRE) ×1 IMPLANT
WIRE FIGHTER CROSSING 190CM (WIRE) ×1 IMPLANT
WIRE HORNET 14 STR TIP 19CM (WIRE) ×1 IMPLANT

## 2019-10-25 NOTE — OR Nursing (Signed)
RADIOLOGY IN TO PERFORM PORTABLE CHEST X-RAYS. DR. Lucianne Lei TRIGT AT [place].

## 2019-10-25 NOTE — Progress Notes (Signed)
Patient transported on ECMO from OR 16 to 8J68 without complications.  O2 and power cord connected to the wall outlets. Heater restarted.

## 2019-10-25 NOTE — Anesthesia Procedure Notes (Addendum)
Central Venous Catheter Insertion Performed by: Effie Berkshire, MD, anesthesiologist Start/End6/06/2019 3:55 PM, 10/25/2019 4:00 PM Patient location: Pre-op. Preanesthetic checklist: patient identified, IV checked, site marked, risks and benefits discussed, surgical consent, monitors and equipment checked, pre-op evaluation, timeout performed and anesthesia consent Hand hygiene performed  and maximum sterile barriers used  PA cath was placed.Swan type:thermodilution Procedure performed without using ultrasound guided technique. Attempts: 1 Patient tolerated the procedure well with no immediate complications.

## 2019-10-25 NOTE — Consult Note (Signed)
[address]       [place] [address]             [place] Record #[id number] Date of Birth: 01-03-1971  Referring: Peter Martinique, MD primary Care: Sherren Mocha, MD Primary Cardiologist:No primary care provider on file.  Chief Complaint: History of myocardial infarction from coronary artery disease  History of Present Illness:     I was asked to see this patient as an emergency consult in the cardiac catheterization lab.  I reviewed his coronary arteriograms and previous echocardiogram and discussed the patient's condition with his cardiologist.  Agreed with recommendation for emergency cardiac surgery for perforation of his LAD during attempted PCI of chronically occluded LAD.  When I talked to the patient he was awake and conscious.  I also spoke with the patient's mother and the cardiac catheterization laboratory area.  Patient understood as well as his mother this was an emergency surgery with high risk but surgery was needed to treat the bleeding from his coronary artery and to repair the artery.   After I saw the patient in the Cath Lab he was transported to the OR, speaking with the anesthesiologist on the way to the OR.  After I placed the preoperative orders and went to the surgical area to change into scrubs I was alerted patient had coded upon arrival in the OR he was moved from his bed to the OR table.  The patient was receiving CPR by the cardiac surgical team as cardiopulmonary bypass personnel and cardiac anesthesia personnel were actively preparing for emergency sternotomy and cardiopulmonary bypass.  Current Activity/ Functional Status: Patient works as a Curator   Zubrod Score: At the time of surgery this patient's most appropriate activity status/level should be described as: []     0    Normal activity, no symptoms []     1    Restricted in physical strenuous activity but ambulatory, able to do out  light work [x]     2    Ambulatory and capable of self care, unable to do work activities, up and about                 more than 50%  Of the time                            []     3    Only limited self care, in bed greater than 50% of waking hours []     4    Completely disabled, no self care, confined to bed or chair []     5    Moribund  Past Medical History:  Diagnosis Date  . Alcohol abuse   . GERD (gastroesophageal reflux disease)   . Tobacco abuse     Past Surgical History:  Procedure Laterality Date  . CORONARY CTO INTERVENTION N/A 10/25/2019   Procedure: CORONARY CTO INTERVENTION;  Surgeon: Martinique, Peter M, MD;  Location: [facility];  Service: Cardiovascular;  Laterality: N/A;  . CORONARY STENT INTERVENTION N/A 10/17/2019   Procedure: CORONARY STENT INTERVENTION;  Surgeon: Sherren Mocha, MD;  Location: [facility];  Service: Cardiovascular;  Laterality: N/A;  . LEFT HEART CATH AND CORONARY ANGIOGRAPHY N/A 10/17/2019   Procedure: LEFT HEART CATH AND CORONARY ANGIOGRAPHY;  Surgeon: Sherren Mocha, MD;  Location: [facility];  Service:  Cardiovascular;  Laterality: N/A;    Social History   Tobacco Use  Smoking Status Current Every Day Smoker    Social History   Substance and Sexual Activity  Alcohol Use Yes     No Known Allergies  Current Facility-Administered Medications  Medication Dose Route Frequency Provider Last Rate Last Admin  . 0.45 % sodium chloride infusion   Intravenous Continuous PRN Gold, Wayne E, PA-C      . 0.45 % sodium chloride infusion   Intravenous Continuous Prescott Gum, Collier Salina, MD      . 0.9 %  sodium chloride infusion (Manually program via Guardrails IV Fluids)   Intravenous Once Candis Shine, CRNA      . 0.9 %  sodium chloride infusion (Manually program via Guardrails IV Fluids)   Intravenous Once Prescott Gum, Collier Salina, MD      . Derrill Memo ON 10/26/2019] 0.9 %  sodium chloride infusion  250 mL Intravenous Continuous Gold, Wayne E, PA-C       . 0.9 %  sodium chloride infusion   Intravenous Continuous Gold, Wayne E, PA-C      . 0.9 %  sodium chloride infusion   Intravenous Continuous Prescott Gum, Collier Salina, MD      . Derrill Memo ON 10/26/2019] acetaminophen (TYLENOL) tablet 1,000 mg  1,000 mg Oral Q6H Gold, Wayne E, PA-C       Or  . [START ON 10/26/2019] acetaminophen (TYLENOL) 160 MG/5ML solution 1,000 mg  1,000 mg Per Tube Q6H Gold, Wayne E, PA-C      . acetaminophen (TYLENOL) 160 MG/5ML solution 650 mg  650 mg Per Tube Once Gold, Wayne E, PA-C       Or  . acetaminophen (TYLENOL) suppository 650 mg  650 mg Rectal Once Gold, Wayne E, PA-C      . albumin human 5 % solution 12.5 g  250 mL Intravenous Q15 min PRN Gold, Wayne E, PA-C      . [START ON 10/26/2019] aspirin EC tablet 325 mg  325 mg Oral Daily Gold, Wayne E, PA-C       Or  . Derrill Memo ON 10/26/2019] aspirin chewable tablet 324 mg  324 mg Per Tube Daily Gold, Wayne E, PA-C      . [START ON 10/26/2019] bisacodyl (DULCOLAX) EC tablet 10 mg  10 mg Oral Daily Gold, Wayne E, PA-C       Or  . Derrill Memo ON 10/26/2019] bisacodyl (DULCOLAX) suppository 10 mg  10 mg Rectal Daily Gold, Wayne E, PA-C      . cefUROXime (ZINACEF) 1.5 g in sodium chloride 0.9 % 100 mL IVPB  1.5 g Intravenous Q12H Gold, Wayne E, PA-C      . chlorhexidine (PERIDEX) 0.12 % solution 15 mL  15 mL Mouth/Throat NOW Gold, Wayne E, PA-C      . dexmedetomidine (PRECEDEX) 400 MCG/100ML (4 mcg/mL) infusion  0-0.7 mcg/kg/hr Intravenous Continuous Gold, Wayne E, PA-C      . dextrose 5 %-0.45 % sodium chloride infusion   Intravenous Continuous Prescott Gum, Collier Salina, MD      . dextrose 50 % solution 0-50 mL  0-50 mL Intravenous PRN Gold, Wayne E, PA-C      . dextrose 50 % solution 0-50 mL  0-50 mL Intravenous PRN Prescott Gum, Collier Salina, MD      . Derrill Memo ON 10/26/2019] docusate sodium (COLACE) capsule 200 mg  200 mg Oral Daily Gold, Wayne E, PA-C      . EPINEPHrine (ADRENALIN) 4 mg in NS  250 mL (0.016 mg/mL) premix infusion  0-10 mcg/min Intravenous Titrated  Prescott Gum, Collier Salina, MD      . famotidine (PEPCID) IVPB 20 mg premix  20 mg Intravenous Q12H Gold, Wayne E, PA-C      . insulin regular, human (MYXREDLIN) 100 units/ 100 mL infusion   Intravenous Continuous Gold, Wayne E, PA-C      . insulin regular, human (MYXREDLIN) 100 units/ 100 mL infusion   Intravenous Continuous Prescott Gum, Collier Salina, MD      . lactated ringers infusion 500 mL  500 mL Intravenous Once PRN Gold, Wayne E, PA-C      . lactated ringers infusion   Intravenous Continuous Gold, Wayne E, PA-C      . lactated ringers infusion   Intravenous Continuous Gold, Wayne E, PA-C      . magnesium sulfate IVPB 4 g 100 mL  4 g Intravenous Once Gold, Wayne E, PA-C      . metoprolol tartrate (LOPRESSOR) tablet 12.5 mg  12.5 mg Oral BID Gold, Wayne E, PA-C       Or  . metoprolol tartrate (LOPRESSOR) 25 mg/10 mL oral suspension 12.5 mg  12.5 mg Per Tube BID Gold, Wayne E, PA-C      . metoprolol tartrate (LOPRESSOR) injection 2.5-5 mg  2.5-5 mg Intravenous Q2H PRN Gold, Wayne E, PA-C      . midazolam (VERSED) injection 2 mg  2 mg Intravenous Q1H PRN Gold, Wayne E, PA-C      . milrinone (PRIMACOR) 20 MG/100 ML (0.2 mg/mL) infusion  0.3 mcg/kg/min Intravenous Continuous Gold, Wayne E, PA-C      . morphine 2 MG/ML injection 1-4 mg  1-4 mg Intravenous Q1H PRN Gold, Wayne E, PA-C      . nitroGLYCERIN 50 mg in dextrose 5 % 250 mL (0.2 mg/mL) infusion  0-100 mcg/min Intravenous Titrated Gold, Wayne E, PA-C      . norepinephrine (LEVOPHED) 35m in 2583mpremix infusion  0-10 mcg/min Intravenous Titrated Gold, Wayne E, PA-C      . ondansetron (ZOFRAN) injection 4 mg  4 mg Intravenous Q6H PRN Gold, Wayne E, PA-C      . ondansetron (ZOFRAN) injection 4 mg  4 mg Intravenous Q6H PRN VaPrescott GumPeCollier SalinaMD      . oxyCODONE (Oxy IR/ROXICODONE) immediate release tablet 5-10 mg  5-10 mg Oral Q3H PRN GoJohn GiovanniPA-C      . [START ON 10/27/2019] pantoprazole (PROTONIX) EC tablet 40 mg  40 mg Oral Daily Gold, Wayne E, PA-C       . pantoprazole (PROTONIX) injection 40 mg  40 mg Intravenous QHS VaPrescott GumPeCollier SalinaMD      . phenylephrine (NEOSYNEPHRINE) 20-0.9 MG/250ML-% infusion  0-100 mcg/min Intravenous Titrated Gold, Wayne E, PA-C      . potassium chloride 10 mEq in 50 mL *CENTRAL LINE* IVPB  10 mEq Intravenous Q1 Hr x 3 Gold, Wayne E, PA-C      . [START ON 10/26/2019] sodium chloride flush (NS) 0.9 % injection 3 mL  3 mL Intravenous Q12H Gold, Wayne E, PA-C      . [START ON 10/26/2019] sodium chloride flush (NS) 0.9 % injection 3 mL  3 mL Intravenous PRN Gold, Wayne E, PA-C      . traMADol (ULTRAM) tablet 50-100 mg  50-100 mg Oral Q4H PRN Gold, Wayne E, PA-C      . [START ON 10/26/2019] vancomycin (VANCOCIN) IVPB 1000 mg/200 mL premix  1,000 mg  Intravenous Once Gold, Wayne E, PA-C      . vasopressin (PITRESSIN) 40 Units in sodium chloride 0.9 % 250 mL (0.16 Units/mL) infusion  0.03 Units/min Intravenous Continuous Gold, Wayne E, PA-C        Medications Prior to Admission  Medication Sig Dispense Refill Last Dose  . aspirin EC 81 MG EC tablet Take 1 tablet (81 mg total) by mouth daily. 90 tablet 2 10/25/2019 at 0500  . atorvastatin (LIPITOR) 80 MG tablet Take 1 tablet (80 mg total) by mouth daily. 90 tablet 1 10/25/2019 at 0500  . cetirizine (ZYRTEC) 10 MG tablet Take 10 mg by mouth daily.   10/25/2019 at 0500  . clopidogrel (PLAVIX) 75 MG tablet Take 1 tablet (75 mg total) by mouth daily. 90 tablet 2 10/25/2019 at 0500  . furosemide (LASIX) 20 MG tablet Take 1 tablet (20 mg total) by mouth daily as needed (for shortness of breath). 30 tablet 0 10/24/2019 at Unknown time  . losartan (COZAAR) 25 MG tablet Take 1 tablet (25 mg total) by mouth daily. 30 tablet 1 10/25/2019 at 0500  . nitroGLYCERIN (NITROSTAT) 0.4 MG SL tablet Place 1 tablet (0.4 mg total) under the tongue every 5 (five) minutes x 3 doses as needed for chest pain. 25 tablet 2 not yet    Family History  Problem Relation Age of Onset  . Heart attack Father      Review  of Systems:   ROS Patient had a non-STEMI approximately a week before with high-grade stenosis of a dominant RCA which was treated with a drug-eluting stent.  At that time a chronically occluded LAD was noted and he was brought back to the hospital for PCI.  Patient is a heavy smoker and drinks 1 quart of beer daily. He works actively as a Curator. He denied any previous thoracic injuries or pneumothorax. He had been taking Plavix for over a week since his previous PCI.    Cardiac Review of Systems: Y or  [    ]= no  Chest Pain [  y  ]  Resting SOB [   ] Exertional SOB  [  ]  Orthopnea [  ]   Pedal Edema [   ]    Palpitations [  ] Syncope  [  ]   Presyncope [   ]  General Review of Systems: [Y] = yes [  ]=no Constitional: recent weight change [  ]; anorexia [  ]; fatigue [ y ]; nausea [  ]; night sweats [  ]; fever [  ]; or chills [  ]                                                               Dental: Last Dentist visit:   Eye : blurred vision [  ]; diplopia [   ]; vision changes [  ];  Amaurosis fugax[  ]; Resp: cough [ y ];  wheezing[  ];  hemoptysis[  ]; shortness of breath[  ]; paroxysmal nocturnal dyspnea[  ]; dyspnea on exertion[  ]; or orthopnea[  ];  GI:  gallstones[  ], vomiting[  ];  dysphagia[  ]; melena[  ];  hematochezia [  ]; heartburn[  ];   Hx of  Colonoscopy[  ];  GU: kidney stones [  ]; hematuria[  ];   dysuria [  ];  nocturia[  ];  history of     obstruction [  ]; urinary frequency [  ]             Skin: rash, swelling[  ];, hair loss[  ];  peripheral edema[  ];  or itching[  ]; Musculosketetal: myalgias[  ];  joint swelling[  ];  joint erythema[  ];  joint pain[  ];  back pain[  ];  Heme/Lymph: bruising[  ];  bleeding[  ];  anemia[  ];  Neuro: TIA[  ];  headaches[  ];  stroke[  ];  vertigo[  ];  seizures[  ];   paresthesias[  ];  difficulty walking[  ];  Psych:depression[  ]; anxiety[  ];  Endocrine: diabetes[  ];  thyroid dysfunction[  ];               Physical  Exam: BP 108/77   Pulse 90   Temp (!) 97 F (36.1 C) (Core)   Resp 10   Ht 5' 8.5" (1.74 m)   Wt 83.9 kg   SpO2 100%   BMI 27.72 kg/m    Patient was examined in the Cath Lab table.  He was anxious but responsive. Lungs were clear Abdominal exam benign Without murmur, sinus rhythm Sheath was present in the right radial artery with a balloon over a wire positioned in the LAD. No evidence of venous insufficiency of the legs   Diagnostic Studies & Laboratory data:     Recent Radiology Findings:   CARDIAC CATHETERIZATION  Result Date: 10/25/2019  Mid LAD lesion is 100% stenosed.  1st Mrg lesion is 100% stenosed.  Mid Cx to Dist Cx lesion is 50% stenosed.  Prox RCA to Mid RCA lesion is 35% stenosed.  A covered stent placed using a STENT PK PAPYRUS 2.5X20. and a 2.5 x 15 mm PK PAPYRUS with unsuccessful sealing of the perforation.  1. Unsuccessful CTO PCI of the LAD. Able to cross the lesion with a wire. With initial balloon inflation with a 2.0 mm balloon the patient developed a large perforation. We were unable to seal the perforation with prolonged balloon inflations and placement of Covered stents x 2. Balloon left inflated in the vessel to tamponade the perforation until definitive surgery done. Plan emergent surgery for oversewing of the perforation.   DG Chest Portable 1 View  Result Date: 10/25/2019 CLINICAL DATA:  49 year old male status post open heart surgery. EXAM: PORTABLE CHEST 1 VIEW COMPARISON:  Earlier radiograph dated 10/25/2019 FINDINGS: The right IJ Swan-Ganz has been advanced with tip now positioned in the right main pulmonary artery. Enteric tube remains folded in the distal esophagus. Recommend retraction and repositioning. No other interval change. IMPRESSION: 1. Interval advancement of the right IJ Swan-Ganz with tip in the right main pulmonary artery. 2. Enteric tube remains folded in the distal esophagus. Electronically Signed   By: Anner Crete M.D.   On:  10/25/2019 18:13   DG Chest Portable 1 View  Result Date: 10/25/2019 CLINICAL DATA:  49 year old male with open heart surgery EXAM: PORTABLE CHEST 1 VIEW COMPARISON:  Chest radiograph dated 10/17/2019 and follow-up radiograph dated 10/25/2019. FINDINGS: Postsurgical changes of median sternotomy. Evaluation of the tube is limited due to superimposition of the support apparatus. An endotracheal tube is noted. The tip of the endotracheal tube is not identified. An enteric tube is seen extending into the distal esophagus and  looping back with side-port over the T1 and tip beyond the upper margin of the image. Recommend removal and repositioning. The right IJ Swan-Ganz catheter with tip likely in the right ventricle. This has been advanced on the follow-up radiograph. There is a left-sided chest tube. Diffuse bilateral interstitial and peribronchial densities. No focal consolidation, large pleural effusion, or pneumothorax. Mild cardiomegaly. IMPRESSION: 1. Open heart surgery. 2. Right IJ Swan-Ganz catheter with tip in the right ventricle. This has been advanced on the follow-up image. 3. Enteric tube folded in the distal esophagus. Recommend retraction and repositioning. Electronically Signed   By: Anner Crete M.D.   On: 10/25/2019 18:11   ECHOCARDIOGRAM LIMITED  Result Date: 10/25/2019    ECHOCARDIOGRAM REPORT   Patient Name:   William Crawford Date of Exam: 10/25/2019 Medical Rec #:  [id number]         Height:       68.5 in Accession #:    [id number]        Weight:       185.0 lb Date of Birth:  09-28-1970         BSA:          1.988 m Patient Age:    22 years          BP:           119/92 mmHg Patient Gender: M                 HR:           85 bpm. Exam Location:  Inpatient Procedure: Limited Echo and Echo Assisted Procedure STAT ECHO Indications:    Pericardial Effusion  History:        Patient has prior history of Echocardiogram examinations, most                 recent 10/17/2019. Risk Factors:Current  Smoker and Alcohol Abuse.  Sonographer:    Mikki Santee RDCS (AE) Referring Phys: [place]  1. Left ventricular ejection fraction, by estimation, is 45%. The left ventricle has mildly decreased function. The left ventricle demonstrates regional wall motion abnormalities, there is hypokinesis of the apical septum, the apical anterior wall, and the true apex. There is mild left ventricular hypertrophy. Left ventricular diastolic parameters are indeterminate.  2. Right ventricular systolic function is normal. The right ventricular size is normal.  3. The mitral valve is normal in structure. No doppler mitral valve regurgitation.  4. Tricuspid valve regurgitation No doppler.  5. The aortic valve was not well visualized. Aortic valve regurgitation is not visualized. No aortic stenosis is present.  6. Moderate pericardial effusion. The IVC is not dilated but there does appear to be a degree of RV diastolic collapse suggesting tamponade. FINDINGS  Left Ventricle: Left ventricular ejection fraction, by estimation, is 45%. The left ventricle has mildly decreased function. The left ventricle demonstrates regional wall motion abnormalities. The left ventricular internal cavity size was normal in size. There is mild left ventricular hypertrophy. Left ventricular diastolic parameters are indeterminate. Right Ventricle: The right ventricular size is normal. No increase in right ventricular wall thickness. Right ventricular systolic function is normal. Left Atrium: Left atrial size was normal in size. Right Atrium: Right atrial size was normal in size. Pericardium: Moderate pericardial effusion. The IVC is not dilated but there does appear to be a degree of RV diastolic collapse suggesting tamponade. A moderately sized pericardial effusion is present. Mitral Valve: The mitral valve is  normal in structure. No doppler mitral valve regurgitation. Tricuspid Valve: The tricuspid valve is normal in  structure. Tricuspid valve regurgitation No doppler. Aortic Valve: The aortic valve was not well visualized. Aortic valve regurgitation is not visualized. No aortic stenosis is present. Pulmonic Valve: The pulmonic valve was not well visualized. Pulmonic valve regurgitation is not visualized. Aorta: The aortic root is normal in size and structure. IAS/Shunts: No atrial level shunt detected by color flow Doppler. Loralie Champagne MD Electronically signed by Loralie Champagne MD Signature Date/Time: 10/25/2019/5:41:19 PM    Final      I have independently reviewed the above radiologic studies and discussed with the patient   Recent Lab Findings: Lab Results  Component Value Date   WBC 10.3 10/18/2019   HGB 7.0 (L) 10/25/2019   HCT 21.4 (L) 10/25/2019   PLT 249 10/25/2019   GLUCOSE 87 10/18/2019   CHOL 193 10/18/2019   TRIG 171 (H) 10/18/2019   HDL 37 (L) 10/18/2019   LDLCALC 122 (H) 10/18/2019   ALT 37 10/17/2019   AST 47 (H) 10/17/2019   NA 137 10/18/2019   K 4.1 10/18/2019   CL 103 10/18/2019   CREATININE 1.10 10/18/2019   BUN 8 10/18/2019   CO2 24 10/18/2019   INR 0.9 10/17/2019   HGBA1C 5.2 10/18/2019      Assessment / Plan:   Emergency cardiac surgery for perforation of LAD during PCI with temporary control using a intracoronary balloon to control bleeding.  Procedure was discussed with both the patient and his mother in the cardiac Cath Lab including benefits and risks.   History of CAD with non-STEMI previously and a PCI to the RCA over a week ago.  EF 40%. History of smoking-COPD History of hypertension History of dyslipidemia History of alcohol abuse       @ME1 @ 10/25/2019 7:05 PM

## 2019-10-25 NOTE — Discharge Summary (Addendum)
Physician Discharge Summary       [address]       [place] [address]             [phone]    Patient ID: William Crawford MRN: [id number] DOB/AGE: [birth year] 49 y.o.  Admit date: 10/25/2019 Discharge date: 11/13/2019  Admission Diagnoses: 1. Angina pectoris ([place]) 2. S/p NSTEMI 3. Ischemic cardiomyopathy 4. Coronary artery disease  Discharge Diagnoses:  1. S/p CABG x  2.  3. History of tobacco use 4. History of alcohol abuse 5. History of hypertension 6. History of hyperlipemia   Consults:  heart failure, physicial medicine, diabetes coordinator, SLP  Procedure (s):   - LAD repair + VA ECMO cannulation in OR 6/3. Chest wall open. - 6/7: ECMO decannulation, Impella 5.5 placement  - 6/11: Impella 5.5 removal.  History of Presenting Illness:  Mr. Juday had sudden onset substernal chest pressure radiating to jaw and shoulder. Episode last for 6-8 hours. Since then he has intermittent chest pain and shortness of breath with and without exertion. Similar to initial episode but not as intense but progressively getting worse. Yesterday he had worsening shortness of breath with orthopnea and PND. Slipping on multiple pillows. This morning he started to have 7/10 substernal chest pressure an came to ER.    BNP 600. CXR with interstitial edema. He is hypertensive. Hs-troponin 2723. Started on IV heparin and IV nitro. Pain improved to 4-5/10. COVID pending. Got ASA 322m. Will give IV lasix 413mqd.     He drinks 12 pack of beer almost every other day and liquir over weekend. Smokes tobacco 1 pack per day since age [age]Father and uncle had Mi in their 5042snd diet of it. No regular visit with PCP. No palpitations, dizziness or syncope. He works as paCurator   He underwent a cardiac catheterization on 10/17/2019. He was found to have a severe stenosis of the mid right coronary artery (suspected culprit lesion) that was treated with Onyx DES, chronic total  occlusion of the proximal LAD and chronic occlusion of the first OM branch of the Circumflex. Echo at that time showed LVEF 35-40%, no significant valvular problems and severe inferolateral  hypokinesis, hypokinesis of the mid to apical inferior wall. Patient was put on DAPT therapy and was to return for possible sage CTO-PCI of the LAD;this was scheduled for 10/25/2019. Unfortunately, the CTO PCI of the LAD was unsuccessful as the patient developed a large perforation. Prolonged balloon inflations as well a placement of 2 covered stents was done and cardiothoracic surgery was consulted for emergent CABG.   Brief Hospital Course:   Mr. JeSamaj Wessellss a patient who underwent an emergency sternotomy and placement on cardiopulmonary bypass, drainage of pericardial, repair of perforation of the left anterior descending calcaneus coronary intervention catheter cannulation for venoarterial extracorporeal membrane oxygenation, and placement of wound VAC by Dr. VaPrescott Gumnd Dr. AdJulien Girtn 10/25/2019.  Heart failure was consulted assisting with medical care.  Critical care was also consulted.  He remained intubated and sedated.  He was transferred to the surgical ICU for continued care.  Postop day 2 patient became agitated and tried to pull his ET tube out. He remained stable on VA ECMO and IV milrinone. Patient was brought back to the OR on 6/7 for decannulation of ECMO and placement of an Impella Left Ventricular Assist Device. On 6/9 her was sedated and in the ventilator. He had an echocardiogram which showed continued improvement  in LV with decreased LV size. On 6/11 we removed the Impella 5.5 left ventricular device. He remained on vanco and meropenum and his WBC was trending down. On 6/14 Mr. Ramnauth was off pressors and we discontinued his arterial line. We were able to discontinued his chest tubes. We had the patient on heparin with dosing per pharmacy for right atrial mural thrombus related to ECMO cannula  but started coumadin and monitored INR. Patient was on plavix for recent RCA stent. Delirium the previous night therefore oxycodone discontinued. On 6/16 he was less confused but still showing impulsive behavior. A sitter was ordered and present in the room. We continued to wean off milrinone. He had some cellulitis of the dorsum of the left hand with a slightly elevated white blood cell count. He was placed on Keflex PO.   On 6/17 his mental status and confusion was improved and he no longer required a sitter. We removed his epicardial pacing wires while his INR was subtherapeutic. Transfer to the stepdown unit for continued care. His sternal staples will remain in place for at least 2 weeks. Physical therapy continued to work with the patient and leave recommendations.      Latest Vital Signs: Blood pressure 111/75, pulse 97, temperature 97.9 F (36.6 C), temperature source Oral, resp. rate 19, height 5' 8.5" (1.74 m), weight 80.2 kg, SpO2 100 %.  Physical Exam  General appearance: alert, cooperative and no distress Heart: regular rate and rhythm, S1, S2 normal, no murmur, click, rub or gallop Lungs: clear to auscultation bilaterally Abdomen: soft, non-tender; bowel sounds normal; no masses,  no organomegaly Extremities: extremities normal, atraumatic, no cyanosis or edema Wound: clean and dry sternal incision with staples in place  Discharge Condition: Stable and discharged to home.  Recent laboratory studies:  Lab Results  Component Value Date   WBC 11.3 (H) 11/11/2019   HGB 12.6 (L) 11/11/2019   HCT 39.3 11/11/2019   MCV 89.1 11/11/2019   PLT 844 (H) 11/11/2019   Lab Results  Component Value Date   NA 134 (L) 11/11/2019   K 4.1 11/11/2019   CL 98 11/11/2019   CO2 24 11/11/2019   CREATININE 1.17 11/11/2019   GLUCOSE 92 11/11/2019      Diagnostic Studies: DG Chest 2 View  Result Date: 10/17/2019 CLINICAL DATA:  Chest pain EXAM: CHEST - 2 VIEW COMPARISON:  None. FINDINGS:  Normal heart size and mediastinal contours. Diffuse interstitial opacity with Kerley lines. No pleural effusion or consolidation. No pneumothorax IMPRESSION: Interstitial edema. Electronically Signed   By: Monte Fantasia M.D.   On: 10/17/2019 06:20   DG Abd 1 View  Result Date: 10/31/2019 CLINICAL DATA:  Tube placement. EXAM: ABDOMEN - 1 VIEW; PORTABLE ABDOMEN - 1 VIEW COMPARISON:  Earlier same day FINDINGS: Nasogastric tube tip is in the gastric antrum. Soft feeding tube is directed to the region of the pylorus but could not be advanced across pylorus. Contrast injection shows antral positioning. Subsequent radiograph shows the soft feeding tube at the antral pylorus junction. Some contrast present within the stomach. Nasogastric tube tip is in the antrum. Gas pattern otherwise unremarkable. IMPRESSION: Soft feeding tube placed only to the level of the antral pylorus junction. Tube could not be passed through the pylorus. Electronically Signed   By: Nelson Chimes M.D.   On: 10/31/2019 13:56   CARDIAC CATHETERIZATION  Result Date: 10/25/2019  Mid LAD lesion is 100% stenosed.  1st Mrg lesion is 100% stenosed.  Mid Cx to  Dist Cx lesion is 50% stenosed.  Prox RCA to Mid RCA lesion is 35% stenosed.  A covered stent placed using a STENT PK PAPYRUS 2.5X20. and a 2.5 x 15 mm PK PAPYRUS with unsuccessful sealing of the perforation.  1. Unsuccessful CTO PCI of the LAD. Able to cross the lesion with a wire. With initial balloon inflation with a 2.0 mm balloon the patient developed a large perforation. We were unable to seal the perforation with prolonged balloon inflations and placement of Covered stents x 2. Balloon left inflated in the vessel to tamponade the perforation until definitive surgery done. Plan emergent surgery for oversewing of the perforation.   CARDIAC CATHETERIZATION  Result Date: 10/17/2019 1.  Severe stenosis of the mid right coronary artery, suspected culprit lesion, treated successfully  with a 4.0 x 18 mm resolute Onyx DES 2.  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  Normal LVEDP Recommendations: Guideline directed medical therapy, DAPT with aspirin and ticagrelor at least 12 months without interruption, consider elective staged CTO-PCI of the LAD. Case reviewed with Dr Martinique.  DG Chest Port 1 View  Result Date: 11/11/2019 CLINICAL DATA:  Postop pain, broken ribs EXAM: PORTABLE CHEST 1 VIEW COMPARISON:  None. FINDINGS: Midline sternotomy. Midline skin staples. Normal cardiac silhouette. Band of atelectasis in the RIGHT lung. Small RIGHT effusion. Improved atelectasis the RIGHT lung base. No pneumothorax. IMPRESSION: 1. Improved aeration at the RIGHT lung base. 2. Small RIGHT effusion and mild atelectasis. Electronically Signed   By: Suzy Bouchard M.D.   On: 11/11/2019 11:16   DG Chest Port 1 View  Result Date: 11/08/2019 CLINICAL DATA:  Status post median sternotomy with chest tube removal yesterday. EXAM: PORTABLE CHEST 1 VIEW COMPARISON:  1 day prior FINDINGS: Removal of right-sided chest tube. Median sternotomy with midline surgical staples. Midline trachea. Borderline cardiomegaly. Possible trace right pleural fluid. No pneumothorax. No congestive failure. Right hemidiaphragm elevation. Improved right infrahilar atelectasis. IMPRESSION: 1. Removal of right chest tube, without pneumothorax. 2. Improved right infrahilar atelectasis. Electronically Signed   By: Abigail Miyamoto M.D.   On: 11/08/2019 09:11   DG Chest Port 1 View  Result Date: 11/07/2019 CLINICAL DATA:  Chest tube EXAM: PORTABLE CHEST 1 VIEW COMPARISON:  11/06/2019 FINDINGS: Platelike scarring/atelectasis in the right lower. Right chest tube. No pneumothorax is seen. Left lung is clear. Heart is normal in size.  Midline skin  staples.  Median sternotomy. Prior enteric tube and right arm PICC have been removed. IMPRESSION: Stable right chest tube.  No pneumothorax is seen. Electronically Signed   By: Julian Hy M.D.   On: 11/07/2019 07:54   DG Chest Port 1 View  Result Date: 11/06/2019 CLINICAL DATA:  Follow-up right chest tube EXAM: PORTABLE CHEST 1 VIEW COMPARISON:  11/05/2019 FINDINGS: Soft feeding tube enters the abdomen. Right arm PICC tip in the SVC 2 cm above the right atrium. Right chest tube remains in place. No pneumothorax. Mild persistent bibasilar atelectasis right more than left. IMPRESSION: Lines and tubes well positioned. No pneumothorax. Persistent basilar atelectasis right left. Electronically Signed   By: Nelson Chimes M.D.   On: 11/06/2019 08:08   DG CHEST PORT 1 VIEW  Result Date: 11/05/2019 CLINICAL DATA:  Chest tube EXAM: PORTABLE CHEST 1 VIEW COMPARISON:  11/04/2019 FINDINGS: Changes of CABG. Support devices including  right chest tube remain in place, unchanged. No pneumothorax. Cardiomegaly with vascular congestion. Areas of atelectasis in the lung bases bilaterally. No visible effusions. IMPRESSION: Postoperative changes. Right chest tube in place without pneumothorax. Cardiomegaly, vascular congestion. Bibasilar atelectasis. Electronically Signed   By: Rolm Baptise M.D.   On: 11/05/2019 01:42   DG Chest Port 1 View  Result Date: 11/04/2019 CLINICAL DATA:  Status post cardiac surgery, extubated EXAM: PORTABLE CHEST 1 VIEW COMPARISON:  11/03/2019 FINDINGS: Interval extubation. Left chest tube, Impella device, and mediastinal drain all removed. Stable cardiomegaly with some improvement in lung volumes but persistent bibasilar atelectasis, worse on the right. Right chest tube remains. No significant or enlarging pneumothorax. No enlarging effusion. Stable postoperative changes from median sternotomy. IMPRESSION: Improving lung volumes with residual bibasilar atelectasis. Electronically Signed   By:  Jerilynn Mages.  Shick M.D.   On: 11/04/2019 10:10   DG Chest Port 1 View  Result Date: 11/03/2019 CLINICAL DATA:  Intubation, LVAD EXAM: PORTABLE CHEST 1 VIEW COMPARISON:  11/02/2019 FINDINGS: Endotracheal tube terminates approximately 3.6 cm above the carina. Enteric tube courses below the diaphragm with distal tip beyond the inferior margin of the film. Right-sided PICC line terminates at the level of the superior cavoatrial junction. Impella left ventricular assist device in stable positioning. Stable mediastinal drain and bilateral chest tubes. Postsurgical changes to the sternum Stable cardiomediastinal contours. Platelike atelectasis within the right lung base, similar to prior. No large pleural fluid collection. No pneumothorax identified. IMPRESSION: 1. Stable support apparatus. 2. Persistent platelike atelectasis within the right lung base. No pneumothorax. Electronically Signed   By: Davina Poke D.O.   On: 11/03/2019 09:40   DG Chest Port 1 View  Result Date: 11/02/2019 CLINICAL DATA:  49 year old male with history of endotracheal tube placement. Left ventricular assist device. EXAM: PORTABLE CHEST 1 VIEW COMPARISON:  Chest x-ray 11/01/2019. FINDINGS: An endotracheal tube is in place with tip 6.9 cm above the carina. A nasogastric tube is seen extending into the stomach, however, the tip of the nasogastric tube extends below the lower margin of the image. Bilateral chest tubes are stable in position. There is a right upper extremity PICC with tip terminating in the superior cavoatrial junction. Percutaneous left ventricular assist device projecting over the left ventricle, left ventricular outflow tract and proximal ascending thoracic aorta. No definite pneumothorax. Lung volumes are low. Extensive atelectasis in the right middle and lower lobe. Subsegmental atelectasis in the left lower lobe. No definite consolidative airspace disease. No definite pleural effusions. No evidence of pulmonary edema. Mild  cardiomegaly. Upper mediastinal contours are distorted by patient positioning. Status post median sternotomy. Midline skin staples. IMPRESSION: 1. Postoperative changes and support apparatus, as above. 2. Persistent atelectasis in the lung bases bilaterally (right greater than left). Electronically Signed   By: Vinnie Langton M.D.   On: 11/02/2019 09:25   DG CHEST PORT 1 VIEW  Result Date: 11/01/2019 CLINICAL DATA:  Endotracheal tube, chest tube, open-heart surgery. EXAM: PORTABLE CHEST 1 VIEW COMPARISON:  10/31/2019. FINDINGS: Endotracheal tube terminates approximately 5 cm above the carina. An Impella left ventricular assist device is stable in position. Right IJ Swan-Ganz catheter tip is in the proximal right pulmonary artery. Right PICC tip is in the low SVC. Nasogastric tube is followed into the stomach with the tip projecting beyond the inferior margin of the image. Mediastinal drain, bilateral chest tubes and epicardial pacer wires remain in place. Heart is enlarged, stable. No pneumothorax. Bibasilar atelectasis, right greater than left. There may  be a small right pleural effusion. IMPRESSION: 1. Stable support tubes and lines, including a percutaneous left ventricular cyst device. 2. No definite pneumothorax. 3. Bibasilar atelectasis, right greater than left. 4. Suspect small right pleural effusion. Electronically Signed   By: Lorin Picket M.D.   On: 11/01/2019 10:03   DG CHEST PORT 1 VIEW  Result Date: 10/31/2019 CLINICAL DATA:  History of ET tube. Additional history provided: Status post open heart surgery, off ECMO today, postop yesterday EXAM: PORTABLE CHEST 1 VIEW COMPARISON:  Prior chest radiographs 10/30/2019 and earlier. FINDINGS: The ET tube terminates immediately below the level of the clavicular heads. Unchanged position of an Impella device. An enteric tube passes below the level of the left hemidiaphragm with tip excluded from the field of view. Unchanged position of a right-sided  PICC with tip projecting in the region of the superior cavoatrial junction. Unchanged position of a right IJ approach Swan-Ganz catheter. Unchanged position of bilateral chest tubes. Cardiomediastinal silhouette unchanged. Persistent small right pleural effusion. Additional ill-defined opacity within the right lung base likely reflects atelectasis. The left lung is clear. No evidence of pneumothorax. IMPRESSION: Support apparatus as described and unchanged in position. Persistent small right pleural effusion. New additional ill-defined opacity at the right lung base likely reflects atelectasis. No evidence of pneumothorax. Electronically Signed   By: Kellie Simmering DO   On: 10/31/2019 09:34   DG Chest Port 1 View  Result Date: 10/30/2019 CLINICAL DATA:  49 year old male with left ventricular assist device. Status post pericardial tamponade, emergency cardiopulmonary bypass 4 days ago. EXAM: PORTABLE CHEST 1 VIEW COMPARISON:  1157 hours today and earlier. FINDINGS: Portable AP semi upright view at 1345 hours. Transesophageal scope has been removed. An enteric feeding tube has been placed, side hole at the level of the gastric cardia. Cardiac and Pella device remains in place and appears stable from earlier today. Otherwise stable lines and tubes. Right pleural effusion is more apparent. No pneumothorax identified. No pulmonary edema. Allowing for portable technique the left lung is clear. Mediastinal contours remain within normal limits. Midline sternotomy wires and skin staples. IMPRESSION: 1. Stable LV assist device from earlier today. 2. Transesophageal scope removed and enteric feeding tube placed, side hole at the level of the gastric cardia. 3. Otherwise stable lines and tubes. 4. Increased visualization of small right pleural effusion. No pneumothorax or pulmonary edema. Electronically Signed   By: Genevie Ann M.D.   On: 10/30/2019 15:42   DG Chest Portable 1 View  Result Date: 10/30/2019 CLINICAL DATA:  RIGHT  chest tube placement, post cardiac surgery EXAM: PORTABLE CHEST 1 VIEW COMPARISON:  Portable exam 1157 hours compared to 1142 hours FINDINGS: New RIGHT thoracostomy tube. Remaining tubes and lines unchanged. Endotracheal tube tip poorly visualized. Decreased RIGHT pleural effusion. Bibasilar atelectasis. IMPRESSION: No pneumothorax following RIGHT chest tube insertion. Bibasilar atelectasis with decreased RIGHT pleural effusion. Electronically Signed   By: Lavonia Dana M.D.   On: 10/30/2019 12:35   DG Chest Port 1 View  Result Date: 10/30/2019 CLINICAL DATA:  Post cardiac surgery EXAM: PORTABLE CHEST 1 VIEW COMPARISON:  Portable exam 1142 hours compared to 0531 hours FINDINGS: Endotracheal tube obscured by other support devices. Mediastinal drain and LEFT thoracostomy tube. Impella device projects over LEFT ventricle. Scope projects over mediastinum. RIGHT arm PICC line tip projects over SVC. RIGHT jugular Swan-Ganz catheter tip projects over RIGHT pulmonary artery at hilum. Post median sternotomy. Epicardial pacing wires noted. RIGHT pleural effusion and basilar atelectasis. Linear subsegmental  atelectasis LEFT lower lobe. No pneumothorax. IMPRESSION: Lines and tubes as above. Bibasilar atelectasis and small RIGHT pleural effusion. Electronically Signed   By: Lavonia Dana M.D.   On: 10/30/2019 12:33   DG Chest Port 1 View  Result Date: 10/30/2019 CLINICAL DATA:  ET tube, chest tube EXAM: PORTABLE CHEST 1 VIEW COMPARISON:  10/29/2019 FINDINGS: Support devices are unchanged. Left chest tube in place. No pneumothorax. Heart is normal size. No confluent opacities, effusions or acute bony abnormality. IMPRESSION: Stable support devices and appearance.  No acute findings. Electronically Signed   By: Rolm Baptise M.D.   On: 10/30/2019 08:17   DG Chest Port 1 View  Result Date: 10/29/2019 CLINICAL DATA:  ECMO. EXAM: PORTABLE CHEST 1 VIEW COMPARISON:  10/28/2019 FINDINGS: RIGHT IJ Swan-Ganz catheter tip overlies the  RIGHT pulmonary artery. RIGHT-sided PICC line tip overlies the superior vena cava. Endotracheal tube is in place in the LOWER trachea, with carina difficult to visualized. ECMO apparatus appears stable. LEFT-sided chest tube Is unchanged in position. Heart size is normal accounting for position. There is minimal LEFT LOWER lobe atelectasis. Film is made with better lung inflation. There is mild perihilar peribronchial thickening. IMPRESSION: 1. Improved lung inflation. 2. Minimal LEFT LOWER lobe atelectasis. Electronically Signed   By: Nolon Nations M.D.   On: 10/29/2019 08:48   DG Chest Port 1 View  Result Date: 10/28/2019 CLINICAL DATA:  Follow-up exam. EXAM: PORTABLE CHEST 1 VIEW COMPARISON:  10/27/2019 and older exams. FINDINGS: Cardiac ECMO catheters are stable as are the left-sided chest tube, endotracheal tube, Swan-Ganz catheter and right-sided PICC. Cardiac silhouette is normal in size. No mediastinal or hilar masses. Clear lungs. IMPRESSION: 1. Stable appearance from the most recent prior study. Lungs remain clear. 2. No change in the support apparatus Electronically Signed   By: Lajean Manes M.D.   On: 10/28/2019 08:35   DG CHEST PORT 1 VIEW  Result Date: 10/27/2019 CLINICAL DATA:  Status post cardiac surgery for repair of LAD injury. Patient on ECMO. EXAM: PORTABLE CHEST 1 VIEW COMPARISON:  Chest x-ray 10/26/2019 FINDINGS: The endotracheal tube, NG tube, right IJ Swan-Ganz catheter, ECMO catheters and left-sided chest tube are stable. No complicating features are identified. The heart is within normal limits in size given the AP projection and portable technique. No acute pulmonary findings. No pleural effusions or pneumothorax. IMPRESSION: 1. Stable support apparatus. 2. No acute cardiopulmonary findings. Electronically Signed   By: Marijo Sanes M.D.   On: 10/27/2019 08:41   DG CHEST PORT 1 VIEW  Result Date: 10/26/2019 CLINICAL DATA:  Endotracheally intubated. Adjustment of endotracheal  tube. EXAM: PORTABLE CHEST 1 VIEW COMPARISON:  Earlier this day at 0532 hour FINDINGS: The endotracheal tube tip is partially obscured by overlying ECMO catheters, below the level of the clavicular heads. Right internal jugular Swan-Ganz catheter tip in the region of the main pulmonary outflow tract. Enteric tube tip below the diaphragm in the stomach. ECMO catheters in place. Left chest tube in place. Mild cardiomegaly with unchanged mediastinal contours. No visualized pneumothorax. No pulmonary edema. Minor retrocardiac atelectasis. IMPRESSION: 1. Endotracheal tube tip partially obscured by overlying ECMO catheters, tip below the level of the clavicular heads and above the current. 2. Other support apparatus unchanged. 3. Cardiomegaly is unchanged.  Minor retrocardiac atelectasis. Electronically Signed   By: Keith Rake M.D.   On: 10/26/2019 10:19   DG Chest Port 1 View in am  Result Date: 10/26/2019 CLINICAL DATA:  Patient status post chest exploration  for repair of a perforated LAD 10/25/2019. The patient is on ECMO. EXAM: PORTABLE CHEST 1 VIEW COMPARISON:  Single-view of the chest 10/25/2019. FINDINGS: Support tubes and lines are unchanged. No pneumothorax is identified with a left chest tube in place. Lungs are clear. No pleural effusion. Heart size is enlarged. IMPRESSION: Lungs remain clear. Support tubes and lines project in good position. No pneumothorax. Electronically Signed   By: Inge Rise M.D.   On: 10/26/2019 09:30   DG Chest Port 1 View  Result Date: 10/25/2019 CLINICAL DATA:  MRI EXAM: PORTABLE CHEST 1 VIEW COMPARISON:  10/25/2019 FINDINGS: NG tube has been straightened and advanced into the stomach. Otherwise support devices are stable. Cardiomegaly. No confluent opacities, effusions or pneumothorax. IMPRESSION: Interval repositioning of NG tube into the stomach. Otherwise no change. Electronically Signed   By: Rolm Baptise M.D.   On: 10/25/2019 19:41   DG Chest Portable 1  View  Result Date: 10/25/2019 CLINICAL DATA:  49 year old male status post open heart surgery. EXAM: PORTABLE CHEST 1 VIEW COMPARISON:  Earlier radiograph dated 10/25/2019 FINDINGS: The right IJ Swan-Ganz has been advanced with tip now positioned in the right main pulmonary artery. Enteric tube remains folded in the distal esophagus. Recommend retraction and repositioning. No other interval change. IMPRESSION: 1. Interval advancement of the right IJ Swan-Ganz with tip in the right main pulmonary artery. 2. Enteric tube remains folded in the distal esophagus. Electronically Signed   By: Anner Crete M.D.   On: 10/25/2019 18:13   DG Chest Portable 1 View  Result Date: 10/25/2019 CLINICAL DATA:  49 year old male with open heart surgery EXAM: PORTABLE CHEST 1 VIEW COMPARISON:  Chest radiograph dated 10/17/2019 and follow-up radiograph dated 10/25/2019. FINDINGS: Postsurgical changes of median sternotomy. Evaluation of the tube is limited due to superimposition of the support apparatus. An endotracheal tube is noted. The tip of the endotracheal tube is not identified. An enteric tube is seen extending into the distal esophagus and looping back with side-port over the T1 and tip beyond the upper margin of the image. Recommend removal and repositioning. The right IJ Swan-Ganz catheter with tip likely in the right ventricle. This has been advanced on the follow-up radiograph. There is a left-sided chest tube. Diffuse bilateral interstitial and peribronchial densities. No focal consolidation, large pleural effusion, or pneumothorax. Mild cardiomegaly. IMPRESSION: 1. Open heart surgery. 2. Right IJ Swan-Ganz catheter with tip in the right ventricle. This has been advanced on the follow-up image. 3. Enteric tube folded in the distal esophagus. Recommend retraction and repositioning. Electronically Signed   By: Anner Crete M.D.   On: 10/25/2019 18:11   DG Abd Portable 1V  Result Date: 10/31/2019 CLINICAL DATA:   Tube placement. EXAM: ABDOMEN - 1 VIEW; PORTABLE ABDOMEN - 1 VIEW COMPARISON:  Earlier same day FINDINGS: Nasogastric tube tip is in the gastric antrum. Soft feeding tube is directed to the region of the pylorus but could not be advanced across pylorus. Contrast injection shows antral positioning. Subsequent radiograph shows the soft feeding tube at the antral pylorus junction. Some contrast present within the stomach. Nasogastric tube tip is in the antrum. Gas pattern otherwise unremarkable. IMPRESSION: Soft feeding tube placed only to the level of the antral pylorus junction. Tube could not be passed through the pylorus. Electronically Signed   By: Nelson Chimes M.D.   On: 10/31/2019 13:56   DG Abd Portable 1V  Result Date: 10/28/2019 CLINICAL DATA:  NG tube. EXAM: PORTABLE ABDOMEN - 1 VIEW  COMPARISON:  None. FINDINGS: NG tube passes well below the stomach, tip projecting in the right upper quadrant in the expected location of the distal stomach. Visualized cardiac ECMO catheters are stable. There is a paucity of bowel gas.  No evidence of bowel dilation. IMPRESSION: 1. Well-positioned nasogastric tube. Electronically Signed   By: Lajean Manes M.D.   On: 10/28/2019 08:36   ECHOCARDIOGRAM COMPLETE  Result Date: 10/26/2019    ECHOCARDIOGRAM REPORT   Patient Name:   THUAN TIPPETT Date of Exam: 10/26/2019 Medical Rec #:  [id number]         Height:       68.5 in Accession #:    [id number]        Weight:       185.0 lb Date of Birth:  Dec 16, 1970         BSA:          1.988 m Patient Age:    22 years          BP:           114/92 mmHg Patient Gender: M                 HR:           112 bpm. Exam Location:  Inpatient Procedure: 2D Echo Indications:    CHF-Acute systolic F81.82  History:        Patient has prior history of Echocardiogram examinations, most                 recent 10/25/2019. CAD, ECMO; Risk Factors:Current Smoker and                 Alcohol Abuse.  Sonographer:    Mikki Santee RDCS (AE) Referring  Phys: [id number]  Sonographer Comments: No parasternal window, no subcostal window and echo performed with patient supine and on artificial respirator. IMPRESSIONS  1. Left ventricular ejection fraction, by estimation, is <20%. The left ventricle has severely decreased function. The left ventricle demonstrates regional wall motion abnormalities (see scoring diagram/findings for description). The left ventricular internal cavity size was mildly dilated. Left ventricular diastolic parameters are consistent with Grade II diastolic dysfunction (pseudonormalization). There is severe akinesis of the left ventricular, mid-apical anteroseptal wall, lateral wall, anterior wall, inferior wall and apical segment.  2. Right ventricular systolic function is severely reduced. The right ventricular size is mildly enlarged.  3. Right atrial size was mildly dilated.  4. The mitral valve is grossly normal. Mild mitral valve regurgitation. No evidence of mitral stenosis.  5. The aortic valve was not well visualized. Aortic valve regurgitation is not visualized. No aortic stenosis is present. FINDINGS  Left Ventricle: Left ventricular ejection fraction, by estimation, is <20%. The left ventricle has severely decreased function. The left ventricle demonstrates regional wall motion abnormalities. Severe akinesis of the left ventricular, mid-apical anteroseptal wall, lateral wall, anterior wall, inferior wall and apical segment. The left ventricular internal cavity size was mildly dilated. There is no left ventricular hypertrophy. Left ventricular diastolic parameters are consistent with Grade II diastolic dysfunction (pseudonormalization). Right Ventricle: The right ventricular size is mildly enlarged. No increase in right ventricular wall thickness. Right ventricular systolic function is severely reduced. Left Atrium: Left atrial size was normal in size. Right Atrium: Right atrial size was mildly dilated. Pericardium: There is  no evidence of pericardial effusion. Mitral Valve: The mitral valve is grossly normal. Mild mitral valve regurgitation. No evidence of mitral valve stenosis.  Tricuspid Valve: The tricuspid valve is grossly normal. Tricuspid valve regurgitation is mild . No evidence of tricuspid stenosis. Aortic Valve: The aortic valve was not well visualized. Aortic valve regurgitation is not visualized. No aortic stenosis is present. Pulmonic Valve: The pulmonic valve was normal in structure. Pulmonic valve regurgitation is not visualized. No evidence of pulmonic stenosis. Aorta: The aortic root and ascending aorta are structurally normal, with no evidence of dilitation. IAS/Shunts: The atrial septum is grossly normal.   Diastology LV e' lateral: 1.65 cm/s LV e' medial:  1.88 cm/s  RIGHT VENTRICLE RV S prime:     3.55 cm/s TAPSE (M-mode): 0.7 cm LEFT ATRIUM             Index       RIGHT ATRIUM           Index LA Vol (A2C):   56.0 ml 28.17 ml/m RA Area:     20.40 cm LA Vol (A4C):   48.7 ml 24.50 ml/m RA Volume:   57.20 ml  28.77 ml/m LA Biplane Vol: 53.1 ml 26.71 ml/m  TRICUSPID VALVE TR Peak grad:   6.9 mmHg TR Vmax:        131.00 cm/s Mertie Moores MD Electronically signed by Mertie Moores MD Signature Date/Time: 10/26/2019/12:07:12 PM    Final    ECHOCARDIOGRAM COMPLETE  Result Date: 10/17/2019    ECHOCARDIOGRAM REPORT   Patient Name:   William Crawford Date of Exam: 10/17/2019 Medical Rec #:  [id number]         Height:       68.0 in Accession #:    [id number]        Weight:       179.0 lb Date of Birth:  [birth year]         BSA:          1.950 m Patient Age:    62 years          BP:           179/115 mmHg Patient Gender: M                 HR:           104 bpm. Exam Location:  Inpatient Procedure: 2D Echo STAT ECHO Indications:    Chest Pain 786.50 / R07.9  History:        Patient has no prior history of Echocardiogram examinations.                 Signs/Symptoms:Shortness of Breath; Risk Factors:Family History                  of Coronary Artery Disease and Current Smoker. ETOH use.  Sonographer:    Darlina Sicilian RDCS Referring Phys: [id number] [place]  1. Left ventricular ejection fraction, by estimation, is 35 to 40%. The left ventricle has moderately decreased function. The left ventricle demonstrates regional wall motion abnormalities. Severe inferolateral hypokinesis, hypokinesis of the mid to apical inferior wall. Left ventricular diastolic parameters are consistent with Grade I diastolic dysfunction (impaired relaxation).  2. Right ventricular systolic function is normal. The right ventricular size is normal. Tricuspid regurgitation signal is inadequate for assessing PA pressure.  3. The mitral valve is normal in structure. No evidence of mitral valve regurgitation. No evidence of mitral stenosis.  4. The aortic valve is tricuspid. Aortic valve regurgitation is not visualized. No aortic stenosis is present.  5. The inferior vena cava is normal in  size with greater than 50% respiratory variability, suggesting right atrial pressure of 3 mmHg. FINDINGS  Left Ventricle: Left ventricular ejection fraction, by estimation, is 35 to 40%. The left ventricle has moderately decreased function. The left ventricle demonstrates regional wall motion abnormalities. The left ventricular internal cavity size was normal in size. There is no left ventricular hypertrophy. Left ventricular diastolic parameters are consistent with Grade I diastolic dysfunction (impaired relaxation). Right Ventricle: The right ventricular size is normal. No increase in right ventricular wall thickness. Right ventricular systolic function is normal. Tricuspid regurgitation signal is inadequate for assessing PA pressure. Left Atrium: Left atrial size was normal in size. Right Atrium: Right atrial size was normal in size. Pericardium: There is no evidence of pericardial effusion. Mitral Valve: The mitral valve is normal in structure. No evidence of  mitral valve regurgitation. No evidence of mitral valve stenosis. Tricuspid Valve: The tricuspid valve is normal in structure. Tricuspid valve regurgitation is not demonstrated. Aortic Valve: The aortic valve is tricuspid. Aortic valve regurgitation is not visualized. No aortic stenosis is present. Pulmonic Valve: The pulmonic valve was normal in structure. Pulmonic valve regurgitation is not visualized. Aorta: The aortic root is normal in size and structure. Venous: The inferior vena cava is normal in size with greater than 50% respiratory variability, suggesting right atrial pressure of 3 mmHg. IAS/Shunts: No atrial level shunt detected by color flow Doppler.  LEFT VENTRICLE PLAX 2D LVIDd:         4.80 cm      Diastology LVIDs:         3.70 cm      LV e' lateral:   4.22 cm/s LV PW:         1.30 cm      LV E/e' lateral: 17.6 LV IVS:        1.00 cm      LV e' medial:    3.89 cm/s LVOT diam:     2.00 cm      LV E/e' medial:  19.1 LV SV:         31 LV SV Index:   16 LVOT Area:     3.14 cm  LV Volumes (MOD) LV vol d, MOD A2C: 158.0 ml LV vol d, MOD A4C: 141.0 ml LV vol s, MOD A2C: 119.0 ml LV vol s, MOD A4C: 101.0 ml LV SV MOD A2C:     39.0 ml LV SV MOD A4C:     141.0 ml LV SV MOD BP:      40.5 ml RIGHT VENTRICLE RV S prime:     13.70 cm/s TAPSE (M-mode): 2.0 cm LEFT ATRIUM             Index       RIGHT ATRIUM           Index LA diam:        3.90 cm 2.00 cm/m  RA Area:     14.00 cm LA Vol (A2C):   30.9 ml 15.85 ml/m RA Volume:   35.60 ml  18.26 ml/m LA Vol (A4C):   23.1 ml 11.85 ml/m LA Biplane Vol: 27.3 ml 14.00 ml/m  AORTIC VALVE LVOT Vmax:   79.20 cm/s LVOT Vmean:  56.700 cm/s LVOT VTI:    0.099 m  AORTA Ao Root diam: 3.20 cm MITRAL VALVE MV Area (PHT): 4.21 cm    SHUNTS MV Decel Time: 180 msec    Systemic VTI:  0.10 m MV E velocity: 74.40 cm/s  Systemic  Diam: 2.00 cm MV A velocity: 86.40 cm/s MV E/A ratio:  0.86 Loralie Champagne MD Electronically signed by Loralie Champagne MD Signature Date/Time:  10/17/2019/12:20:06 PM    Final    ECHO INTRAOPERATIVE TEE  Result Date: 11/03/2019  *INTRAOPERATIVE TRANSESOPHAGEAL REPORT *  Patient Name:   ALDOUS HOUSEL Date of Exam: 11/03/2019 Medical Rec #:  [id number]         Height:       68.5 in Accession #:    [id number]        Weight:       205.2 lb Date of Birth:  [birth year]         BSA:          2.08 m Patient Age:    36 years          BP:           126/74 mmHg Patient Gender: M                 HR:           73 bpm. Exam Location:  Anesthesiology Transesophogeal exam was perform intraoperatively during surgical procedure. Patient was closely monitored under general anesthesia during the entirety of examination. Indications:     Removal of Impella Device Sonographer:     Raquel Sarna Senior Performing Phys: [place] TRIGT Diagnosing Phys: Roberts Gaudy MD PRE-OP FINDINGS  Left Ventricle: The left ventricle has mildly reduced systolic function, with an ejection fraction of 45-50%. The cavity size was normal. There is mildly increased left ventricular wall thickness. The LV cavity was normal in size and measured 4.6 cm at end-diastole in the mid-papillary level. There was akinesis of the distal anterior septum and apex with normal wall motion in the other LV segments. The ejection fraction was estimated at 45-50% by visual inspection. There was mild concentric LV hypertrophy. Right Ventricle: The right ventricle has normal systolic function. The cavity was normal. There is no increase in right ventricular wall thickness. Left Atrium: No thrombi or masses could be seen within the LA cavity. The left atrial appendage is well visualized and there is no evidence of thrombus present. Right Atrium: Within the right atrium, there was an oscillating mass with the appearance of a thrombus attached to the anterior RA wall. The dimensions were 1.3 x 1.2 cm. Oscillating masses were also present within the superior and inferior vena cavae as they joined the right atrium. The  attachments of these masses could not be seen but they appeared to originate within the SVC and IVC respectively. There were no free floating masses within the RA, RV, or main pulmonary artery. Interatrial Septum: No atrial level shunt detected by color flow Doppler. Pericardium: There is no evidence of pericardial effusion. Mitral Valve: The mitral valve is normal in structure. Mitral valve regurgitation is trivial by color flow Doppler. There is No evidence of mitral stenosis. Tricuspid Valve: The tricuspid valve was normal in structure. Tricuspid valve regurgitation is trivial by color flow Doppler. There is no evidence of tricuspid valve vegetation. 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 The impella outflow port was at the level of the sintubular junction and the inflow port was approximately 5.5 cm proximal to the aortic valve. The impella was then removed and was no longer visible within the LV or aorta. Pulmonic Valve: The pulmonic valve was normal in structure. Pulmonic valve regurgitation is trivial by color flow Doppler.  Roberts Gaudy MD Electronically signed by Roberts Gaudy MD Signature Date/Time: 11/03/2019/7:18:29 PM    Final    ECHO INTRAOPERATIVE TEE  Result Date: 10/30/2019  *INTRAOPERATIVE TRANSESOPHAGEAL REPORT *  Patient Name:   IZAYA NETHERTON Date of Exam: 10/30/2019 Medical Rec #:  [id number]         Height:       68.5 in Accession #:    [id number]        Weight:       191.4 lb Date of Birth:  Nov 14, 1970         BSA:          2.02 m Patient Age:    75 years          BP:           87/67 mmHg Patient Gender: M                 HR:           67 bpm. Exam Location:  Anesthesiology Transesophogeal exam was perform intraoperatively during surgical procedure. Patient was closely monitored under general anesthesia during the entirety of examination. Indications:     Decannulation of Ecmo Sonographer:     Vickie Epley RDCS Performing Phys: [place] TRIGT Diagnosing Phys: Duane Boston MD Complications: No known complications during this procedure. POST-OP IMPRESSIONS - Comments: Impella in place accross the Aortic valve in good position. Echmo canulas removed. Exam otherwise unchanged. PRE-OP FINDINGS  Left Ventricle: The left ventricle has severely reduced systolic function, with an ejection fraction of 25-30%. The cavity size was moderately dilated. There is moderately increased left ventricular wall thickness. Left ventrical global hypokinesis without regional wall motion abnormalities. Right Ventricle: The right ventricle has normal systolic function. The cavity was normal. There is right vetricular wall thickness was not assessed. Left Atrium: Left atrial size was normal in size. Right Atrium: Right atrial size was normal in size. Interatrial Septum: No atrial level shunt detected by color flow Doppler. Pericardium: Trivial pericardial effusion is present. Mitral Valve: The mitral valve is normal in structure. Mild thickening of the mitral valve leaflet. Mild calcification of the mitral valve leaflet. Mitral valve regurgitation is trivial by color flow Doppler. The MR jet is centrally-directed. There is No  evidence of mitral stenosis. Tricuspid Valve: The tricuspid valve was normal in structure. Tricuspid valve regurgitation is trivial by color flow Doppler. Aortic Valve: The aortic valve is normal in structure. There is mild thickening of the aortic valve and There is mild calcification of the aortic valve Aortic valve regurgitation is trivial by color flow Doppler. There is no stenosis of the aortic valve,  with a calculated valve area of 1.52 cm. Pulmonic Valve: The pulmonic valve was normal in structure. Pulmonic valve regurgitation is trivial by color flow Doppler. Aorta: The aortic root and ascending aorta are normal in size and structure. The aortic arch was not well visualized. +--------------+--------++ LEFT VENTRICLE          +--------------+--------++ PLAX 2D                +--------------+--------++ +------------+-------------++ LVIDd:        5.20 cm  3D Volume EF              +--------------+--------++ +------------+-------------++ LVIDs:        4.80 cm  LV 3D EF:   31.70 %       +--------------+--------++ +------------+-------------++ LVOT diam:    2.30 cm  LV 3D EDV:  141000.00 mm +--------------+--------++ +------------+-------------++ LV SV:        22 ml    LV 3D ESV:  96400.00 mm  +--------------+--------++ +------------+-------------++ LV SV Index:  10.63    LV 3D SV:   44600.00 mm  +--------------+--------++ +------------+-------------++ LVOT Area:    4.15 cm +--------------+--------++                        +--------------+--------++ +---------------+---------+---------+ RIGHT VENTRICLE                   +---------------+---------+---------+ RV S prime:    4.10 cm/s14.4 cm/s +---------------+---------+---------+ +------------------+-----------++ AORTIC VALVE                  +------------------+-----------++ AV Area (Vmax):   1.69 cm    +------------------+-----------++ AV Area (Vmean):  1.79 cm    +------------------+-----------++ AV Area (VTI):    1.52 cm    +------------------+-----------++ AV Vmax:          102.00 cm/s +------------------+-----------++ AV Vmean:         64.100 cm/s +------------------+-----------++ AV VTI:           0.139 m     +------------------+-----------++ AV Peak Grad:     4.2 mmHg    +------------------+-----------++ AV Mean Grad:     2.0 mmHg    +------------------+-----------++ LVOT Vmax:        41.50 cm/s  +------------------+-----------++ LVOT Vmean:       27.600 cm/s +------------------+-----------++ LVOT VTI:         0.051 m     +------------------+-----------++ LVOT/AV VTI ratio:0.37        +------------------+-----------++ +-------------+---------++ MITRAL VALVE            +--------------+-------+ +-------------+---------++ SHUNTS                MV Peak grad:1.0 mmHg  +--------------+-------+ +-------------+---------++ Systemic VTI: 0.05 m  MV Mean grad:0.0 mmHg  +--------------+-------+ +-------------+---------++ Systemic Diam:2.30 cm MV Vmax:     0.51 m/s  +--------------+-------+ +-------------+---------++ MV Vmean:    32.2 cm/s +-------------+---------++ MV VTI:      0.12 m    +-------------+---------++  Duane Boston MD Electronically signed by Duane Boston MD Signature Date/Time: 10/30/2019/12:28:50 PM    Final    ECHO INTRAOPERATIVE TEE  Result Date: 10/26/2019  *INTRAOPERATIVE TRANSESOPHAGEAL REPORT *  Patient Name:   William Crawford Date of Exam: 10/25/2019 Medical Rec #:  [id number]         Height:       68.5 in Accession #:    [id number]        Weight:       185.0 lb Date of Birth:  10-10-1970         BSA:          1.99 m Patient Age:    51 years          BP:           97/66 mmHg Patient Gender: M                 HR:           140 bpm. Exam Location:  Anesthesiology Transesophogeal exam was perform intraoperatively during surgical procedure. Patient was closely monitored under general anesthesia during the entirety of examination. Indications:     CAD Native Vessel Sonographer:     Raquel Sarna Senior RDCS Performing Phys: Suella Broad MD Diagnosing Phys: Lennette Bihari  Hollis MD Complications: No known complications during this procedure.  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 CV Bypass initiated prior                                to TEE insertion.     Cath lab echo showed EF 45% with apical hypokinesis, moderate pericardial                       effusion and evidence for tamponade                                  POST OP FINDINGS  Left Ventricle: The left ventricle has a visually estimated ejection fraction of < 20%. The cavity size was severely dilated. There  is no increase in left ventricular wall thickness. Left ventricular diffuse hypokinesis. Akinesis of the septal, inferoseptal, inferior and lateral wall. ECMO initiated for CV support. Right Ventricle: The right ventricle has moderately reduced systolic function. The cavity was normal. There is no increase in right ventricular wall thickness. Left Atrium: Left atrial size was normal in size. The left atrial appendage is well visualized and there is no evidence of thrombus present. Left atrial appendage velocity is normal at greater than 40 cm/s. Right Atrium: Right atrial size was normal in size. Interatrial Septum: No atrial level shunt detected by color flow Doppler. Pericardium: There is no evidence of pericardial effusion after initial exploration and evacuation. There is no pleural effusion. Mitral Valve: The mitral valve is normal in structure. No thickening of the mitral valve leaflet. No calcification of the mitral valve leaflet. Mitral valve regurgitation is mild by color flow Doppler but likely due to the presence of the LV vent. The MR  jet is centrally-directed. There is no evidence of mitral valve vegetation. There is no evidence of mitral stenosis. Tricuspid Valve: The tricuspid valve was normal in structure. Tricuspid valve regurgitation is trivial by color flow Doppler. There is no evidence of tricuspid valve vegetation. Aortic Valve: The aortic valve is tricuspid with no calcification and thickening. Aortic valve regurgitation was not visualized by color flow Doppler. There is no evidence of aortic valve stenosis. There is no evidence of a vegetation on the aortic valve. Pulmonic Valve: The pulmonic valve was normal in structure. Pulmonic valve regurgitation is not visualized by color flow Doppler. Aorta: The aortic root and ascending aorta are normal in size and structure. The descending aorta was not well visualized. Pulmonary Artery: The pulmonary artery is of normal size.  Suella Broad MD  Electronically signed by Suella Broad MD Signature Date/Time: 10/26/2019/9:20:42 AM    Final    ECHOCARDIOGRAM LIMITED  Result Date: 11/02/2019    ECHOCARDIOGRAM LIMITED REPORT   Patient Name:   William Crawford Date of Exam: 11/02/2019 Medical Rec #:  [id number]         Height:       68.5 in Accession #:    [id number]        Weight:       206.3 lb Date of Birth:  07/27/1970         BSA:          2.082 m Patient Age:    15 years  BP:           0/0 mmHg Patient Gender: M                 HR:           104 bpm. Exam Location:  Inpatient Procedure: Limited Echo Indications:    CHF 428.21  History:        Patient has prior history of Echocardiogram examinations, most                 recent 11/01/2019. Impella LVAD device. ETOH abuse. Tobacco Abuse.  Sonographer:    [name] RDCS (AE) Referring Phys: [place]  1. Limited echo without color flow Doppler. Impella catheter noted in the LV across the aortic valve. Systolic function has improved.  2. Right ventricular function appears normal on limited views.  3. Left ventricular ejection fraction, by estimation, is 40 to 45%. The left ventricle has mildly decreased function. The left ventricle demonstrates global hypokinesis. Left ventricular diastolic function could not be evaluated.  4. Right ventricular systolic function is normal. The right ventricular size is normal.  5. The mitral valve is grossly normal.  6. The aortic valve is grossly normal. FINDINGS  Left Ventricle: Left ventricular ejection fraction, by estimation, is 40 to 45%. The left ventricle has mildly decreased function. The left ventricle demonstrates global hypokinesis. The left ventricular internal cavity size was normal in size. There is  no left ventricular hypertrophy. Right Ventricle: The right ventricular size is normal. No increase in right ventricular wall thickness. Right ventricular systolic function is normal. Left Atrium: Left atrial size was normal in size.  Right Atrium: Right atrial size was normal in size. Pericardium: There is no evidence of pericardial effusion. Mitral Valve: The mitral valve is grossly normal. Normal mobility of the mitral valve leaflets. Tricuspid Valve: The tricuspid valve is grossly normal. Aortic Valve: The aortic valve is grossly normal. Pulmonic Valve: The pulmonic valve was normal in structure. Pulmonic valve regurgitation is not visualized. No evidence of pulmonic stenosis. Aorta: The aortic root was not well visualized. IAS/Shunts: No atrial level shunt detected by color flow Doppler.  LEFT VENTRICLE PLAX 2D LVOT diam:     2.10 cm LVOT Area:     3.46 cm   SHUNTS Systemic Diam: 2.10 cm Skeet Latch MD Electronically signed by Skeet Latch MD Signature Date/Time: 11/02/2019/12:31:14 PM    Final    ECHOCARDIOGRAM LIMITED  Result Date: 11/01/2019    ECHOCARDIOGRAM LIMITED REPORT   Patient Name:   ZONG MCQUARRIE Date of Exam: 11/01/2019 Medical Rec #:  [id number]         Height:       68.5 in Accession #:    [id number]        Weight:       203.5 lb Date of Birth:  May 12, 1971         BSA:          2.070 m Patient Age:    31 years          BP:           105/68 mmHg Patient Gender: M                 HR:           77 bpm. Exam Location:  Inpatient Procedure: Limited Echo Indications:    I50.23 Acute on chronic systolic (congestive) heart failure  History:  Patient has prior history of Echocardiogram examinations, most                 recent 10/31/2019. Previous Myocardial Infarction; Risk                 Factors:Current Smoker and Dyslipidemia. Impella. Alcohol abuse.  Sonographer:    Jonelle Sidle Dance Referring Phys: [place]  1. Left ventricular ejection fraction, by estimation, is 30 to 35%. The left ventricle has moderately decreased function. Conclusion(s)/Recommendation(s): Limited echocardiogram for impella position. In many images, there is frequent ventricular ectopy. Final position is 4.8 cm from aortic  annulus. Compared to yesterday's echo, impella has been retracted slightly. FINDINGS  Left Ventricle: Left ventricular ejection fraction, by estimation, is 30 to 35%. The left ventricle has moderately decreased function. Abnormal (paradoxical) septal motion consistent with post-operative status. Buford Dresser MD Electronically signed by Buford Dresser MD Signature Date/Time: 11/01/2019/1:51:48 PM    Final    ECHOCARDIOGRAM LIMITED  Result Date: 10/31/2019    ECHOCARDIOGRAM LIMITED REPORT   Patient Name:   KARNELL VANDERLOOP Date of Exam: 10/31/2019 Medical Rec #:  [id number]         Height:       68.5 in Accession #:    [id number]        Weight:       203.5 lb Date of Birth:  31-May-1970         BSA:          2.070 m Patient Age:    49 years          BP:           113/82 mmHg Patient Gender: M                 HR:           90 bpm. Exam Location:  Inpatient Procedure: Limited Echo Indications:    CHF-Acute Systolic 979.48 / A16.55  History:        Patient has prior history of Echocardiogram examinations, most                 recent 10/29/2019. CAD; Risk Factors:Dyslipidemia, Hypertension                 and Current Smoker. LVAD, NSTEMI (non-ST elevated myocardial                 infarction, Cardiogenic shock , Ischemic cardiomyopathy,                 impella.  Sonographer:    Leavy Cella Referring Phys: [place]  1. Limited TTE to assess Impella positoning. Impella extends 5.4 cm from AV into LV cavity. Left ventricular ejection fraction, by estimation, is 25 to 30%. The left ventricle has severely decreased function. FINDINGS  Left Ventricle: Limited TTE to assess Impella positoning. Impella extends 5.4 cm from AV into LV cavity. Left ventricular ejection fraction, by estimation, is 25 to 30%. The left ventricle has severely decreased function. Pericardium: Trivial pericardial effusion is present. Oswaldo Milian MD Electronically signed by Oswaldo Milian MD Signature  Date/Time: 10/31/2019/2:12:33 PM    Final    ECHOCARDIOGRAM LIMITED  Result Date: 10/29/2019    ECHOCARDIOGRAM LIMITED REPORT   Patient Name:   William Crawford Date of Exam: 10/29/2019 Medical Rec #:  [id number]         Height:       68.5 in Accession #:  [id number]        Weight:       193.6 lb Date of Birth:  1970/05/30         BSA:          2.027 m Patient Age:    28 years          BP:           98/75 mmHg Patient Gender: M                 HR:           80 bpm. Exam Location:  Inpatient Procedure: 2D Echo, Cardiac Doppler and Color Doppler Indications:    Q82.50 Acute systolic (congestive) heart failure  History:        Patient has prior history of Echocardiogram examinations, most                 recent 10/26/2019. LV DysFx, s/p Cardiac Surgery.  Sonographer:    Merrie Roof RDCS Referring Phys: [place] Comments: This was a limited echo to assess RV/LV function and adjust RPMs on ECMO machine. IMPRESSIONS  1. Ramp test. Left ventricular ejection fraction, by estimation, is 20 to 25%. The left ventricle has severely decreased function. The left ventricle has no regional wall motion abnormalities.  2. Right ventricular systolic function is normal. The right ventricular size is normal.  3. The mitral valve is normal in structure.  4. The aortic valve is normal in structure. FINDINGS  Left Ventricle: Ramp test. Left ventricular ejection fraction, by estimation, is 20 to 25%. The left ventricle has severely decreased function. The left ventricle has no regional wall motion abnormalities. The left ventricular internal cavity size was normal in size. There is no left ventricular hypertrophy. Right Ventricle: The right ventricular size is normal. No increase in right ventricular wall thickness. Right ventricular systolic function is normal. Left Atrium: Left atrial size was normal in size. Right Atrium: Right atrial size was normal in size. Pericardium: There is no evidence of pericardial  effusion. Mitral Valve: The mitral valve is normal in structure. Normal mobility of the mitral valve leaflets. Tricuspid Valve: The tricuspid valve is normal in structure. Aortic Valve: The aortic valve is normal in structure. Pulmonic Valve: The pulmonic valve was not assessed. IAS/Shunts: No atrial level shunt detected by color flow Doppler.   LV Volumes (MOD) LV vol d, MOD A2C: 102.8 ml LV vol d, MOD A4C: 80.3 ml LV vol s, MOD A2C: 79.4 ml LV vol s, MOD A4C: 52.1 ml LV SV MOD A2C:     23.4 ml LV SV MOD A4C:     80.3 ml LV SV MOD BP:      25.6 ml Candee Furbish MD Electronically signed by Candee Furbish MD Signature Date/Time: 10/29/2019/11:46:03 AM    Final    ECHOCARDIOGRAM LIMITED  Result Date: 10/25/2019    ECHOCARDIOGRAM REPORT   Patient Name:   William Crawford Date of Exam: 10/25/2019 Medical Rec #:  [id number]         Height:       68.5 in Accession #:    [id number]        Weight:       185.0 lb Date of Birth:  19-Jun-1970         BSA:          1.988 m Patient Age:    50 years  BP:           119/92 mmHg Patient Gender: M                 HR:           85 bpm. Exam Location:  Inpatient Procedure: Limited Echo and Echo Assisted Procedure STAT ECHO Indications:    Pericardial Effusion  History:        Patient has prior history of Echocardiogram examinations, most                 recent 10/17/2019. Risk Factors:Current Smoker and Alcohol Abuse.  Sonographer:    Mikki Santee RDCS (AE) Referring Phys: [place]  1. Left ventricular ejection fraction, by estimation, is 45%. The left ventricle has mildly decreased function. The left ventricle demonstrates regional wall motion abnormalities, there is hypokinesis of the apical septum, the apical anterior wall, and the true apex. There is mild left ventricular hypertrophy. Left ventricular diastolic parameters are indeterminate.  2. Right ventricular systolic function is normal. The right ventricular size is normal.  3. The mitral valve  is normal in structure. No doppler mitral valve regurgitation.  4. Tricuspid valve regurgitation No doppler.  5. The aortic valve was not well visualized. Aortic valve regurgitation is not visualized. No aortic stenosis is present.  6. Moderate pericardial effusion. The IVC is not dilated but there does appear to be a degree of RV diastolic collapse suggesting tamponade. FINDINGS  Left Ventricle: Left ventricular ejection fraction, by estimation, is 45%. The left ventricle has mildly decreased function. The left ventricle demonstrates regional wall motion abnormalities. The left ventricular internal cavity size was normal in size. There is mild left ventricular hypertrophy. Left ventricular diastolic parameters are indeterminate. Right Ventricle: The right ventricular size is normal. No increase in right ventricular wall thickness. Right ventricular systolic function is normal. Left Atrium: Left atrial size was normal in size. Right Atrium: Right atrial size was normal in size. Pericardium: Moderate pericardial effusion. The IVC is not dilated but there does appear to be a degree of RV diastolic collapse suggesting tamponade. A moderately sized pericardial effusion is present. Mitral Valve: The mitral valve is normal in structure. No doppler mitral valve regurgitation. Tricuspid Valve: The tricuspid valve is normal in structure. Tricuspid valve regurgitation No doppler. Aortic Valve: The aortic valve was not well visualized. Aortic valve regurgitation is not visualized. No aortic stenosis is present. Pulmonic Valve: The pulmonic valve was not well visualized. Pulmonic valve regurgitation is not visualized. Aorta: The aortic root is normal in size and structure. IAS/Shunts: No atrial level shunt detected by color flow Doppler. Loralie Champagne MD Electronically signed by Loralie Champagne MD Signature Date/Time: 10/25/2019/5:41:19 PM    Final    [place] EKG SITE RITE  Result Date: 10/27/2019 If Site Rite image not attached,  placement could not be confirmed due to current cardiac rhythm.  Discharge Instructions     Amb Referral to Cardiac Rehabilitation   Complete by: As directed    Diagnosis:  NSTEMI Heart Failure (see criteria below if ordering Phase II) Other     Heart Failure Type: Chronic Systolic Comment - emergency sternotomy repair of perferation of LAD, tamponade   After initial evaluation and assessments completed: Virtual Based Care may be provided alone or in conjunction with Phase 2 Cardiac Rehab based on patient barriers.: Yes       Discharge Medications: Allergies as of 11/11/2019   No Known Allergies  Medication List     STOP taking these medications    furosemide 20 MG tablet Commonly known as: Lasix   losartan 25 MG tablet Commonly known as: COZAAR   nitroGLYCERIN 0.4 MG SL tablet Commonly known as: NITROSTAT       TAKE these medications    amiodarone 200 MG tablet Commonly known as: PACERONE Take 1 tablet (200 mg total) by mouth 2 (two) times daily.   aspirin 81 MG EC tablet Take 1 tablet (81 mg total) by mouth daily.   atorvastatin 40 MG tablet Commonly known as: LIPITOR Take 1 tablet (40 mg total) by mouth daily. What changed:  medication strength how much to take   cephALEXin 500 MG capsule Commonly known as: KEFLEX Take 1 capsule (500 mg total) by mouth 4 (four) times daily for 4 days.   cetirizine 10 MG tablet Commonly known as: ZYRTEC Take 10 mg by mouth daily.   clopidogrel 75 MG tablet Commonly known as: PLAVIX Take 1 tablet (75 mg total) by mouth daily.   feeding supplement (ENSURE ENLIVE) Liqd Take 237 mLs by mouth 3 (three) times daily between meals.   HYDROmorphone 2 MG tablet Commonly known as: DILAUDID Take 0.5 tablets (1 mg total) by mouth every 6 (six) hours as needed for severe pain.   ketotifen 0.025 % ophthalmic solution Commonly known as: ZADITOR Place 1 drop into both eyes 2 (two) times daily.   lidocaine 4 %  cream Commonly known as: LMX Apply topically 2 (two) times daily as needed (pain).   OLANZapine 2.5 MG tablet Commonly known as: ZYPREXA Take 1 tablet (2.5 mg total) by mouth at bedtime.   sacubitril-valsartan 97-103 MG Commonly known as: ENTRESTO Take 1 tablet by mouth 2 (two) times daily.   spironolactone 25 MG tablet Commonly known as: ALDACTONE Take 1 tablet (25 mg total) by mouth daily.   thiamine 100 MG tablet Take 1 tablet (100 mg total) by mouth daily.   torsemide 20 MG tablet Commonly known as: DEMADEX Take 2 tablets (40 mg total) by mouth daily.   warfarin 5 MG tablet Commonly known as: COUMADIN Take 1 tablet (5 mg total) by mouth one time only at 4 PM.       The patient has been discharged on:   1.Beta Blocker:  Yes [   ]                              No   [ no  ]                              If No, reason:  2.Ace Inhibitor/ARB: Yes [   ]                                     No  [  no  ]                                     If No, reason:  3.Statin:   Yes [ yes  ]                  No  [   ]  If No, reason:  4.Ecasa:  Yes  [  yes ]                  No   [   ]                  If No, reason:  Follow Up Appointments:  Follow-up Information     [name], MD Follow up.   Specialty: Cardiothoracic Surgery Why: PA/LAT CXR to be taken (at [facility] which is in the same building as Dr. Lucianne Lei Trigt's office at 2:00pm on 7/21); Appointment is on 7/21 @ 2:30pm with Dr. Prescott Gum Contact information: [address] [place] [address] [phone]         Sherren Mocha, MD Follow up.   Specialty: Cardiology Contact information: [address] [address] [phone]         [facility] Follow up on 11/24/2019.   Specialty: Cardiology Why: @ 11:30 AM Contact information: [address] [id number] [place]        Richardson Dopp T, PA-C Follow up.   Specialties: Cardiology, Physician Assistant Why: Your routine follow-up appointment is on 7/20 at 1:45pm. Please bring your hospital paperwork Contact information: [address] [address] [phone]         staple removal appointment Follow up.   Why: Your staple removal appointment is on 6/25 at 10:00am Contact information: Dr. Lucianne Lei Trigt's office        [facility] Follow up.   Specialty: Cardiology Why: The [facility] clinic will reach out to you on Monday with a follow-up appointment time and date for your INR draw.  Contact information: [address], [place] [phone]        [facility] Follow up.   Why: [facility] will provide home health services for physical therapy, occupational therapy and nursing. Please contact Malachy Mood with [facility] at [phone] for any questions. Contact information: [place] [address] [phone]         [facility] .   Contact information: [place] [address] [phone]                 Signed: Alvester Chou 11/13/2019, 1:07 PM  patient examined and medical record reviewed,agree with above note. [name] 11/15/2019

## 2019-10-25 NOTE — Transfer of Care (Signed)
Immediate Anesthesia Transfer of Care Note  Patient: William Crawford  Procedure(s) Performed: TRANSESOPHAGEAL ECHOCARDIOGRAM (TEE) (N/A ) Median Sternotomy. Cannulation For Ecmo (Extracorporeal Membrane Oxygenation) @ 5956 Application Of Wound Vac Chest Exploration for repair of coronary perforation (repair of LAD)  Patient Location: ICU  Anesthesia Type:General  Level of Consciousness: Patient remains intubated per anesthesia plan  Airway & Oxygen Therapy: Patient remains intubated per anesthesia plan and Patient placed on Ventilator (see vital sign flow sheet for setting)  Post-op Assessment: Report given to RN  Post vital signs: Reviewed and unstable  Last Vitals:  Vitals Value Taken Time  BP 108/77 10/25/19 1840  Temp    Pulse 90 10/25/19 1840  Resp 10 10/25/19 1847  SpO2 100 % 10/25/19 1840  Vitals shown include unvalidated device data.  Last Pain:  Vitals:   10/25/19 1229  TempSrc:   PainSc: 0-No pain         Complications: No apparent anesthesia complications

## 2019-10-25 NOTE — Consult Note (Addendum)
NAME:  William Crawford, MRN:  [id number], DOB:  November 04, 1970, LOS: 0 ADMISSION DATE:  10/25/2019, CONSULTATION DATE:  10/25/19 REFERRING MD:  [place], CHIEF COMPLAINT:  Heart failure  Brief History   49 yo male admitted to ICU on 10/25/19 following CTO PCI to LAD at which time he unfortunately developed a perforated LAD. He subsequently underwent emergent repair however was unable to be removed from bypass following the procedure. PCCM consulted for ECMO.  History of present illness   Pt initially presented with unstable angina on 5/25 at which time he underwent a heart cath which revealed occlusive disease involving the RCA as well as some chronic occlusions involving the first OM branch and proximal LAD. DES placed to the RCA lesion. He was subsequently discharged on DAPT and returned today for stent placement to the LAD.  He, unfortunately, developed a large perforation to the LAD during the procedure and was taken for emergent CABG.  Past Medical History  Tobacco use, alcohol use, HFrEF, CAD s/p DES placement 5/25  Significant Hospital Events   6/2 LAD perforation during DES placement>emergent CABG>unable to wean bypass>PCCM consulted for ECMO  Consults:  PCCM Cardiology CT surgery  Procedures:  6/2 PCI 6/2 CABG 6/2 ECMO  ETT 6/2>> Central line 6/2>>  Significant Diagnostic Tests:  5/25 echocardiogram>>LVEF 35-40% with regional wall motion abnormalities. Severe inferolateral hypokinesis; G1DD. No valvular disease  Micro Data:  n/a  Antimicrobials:  n/a   Interim history/subjective:  See above  Objective   Blood pressure 112/70, pulse (!) 102, temperature (!) 97.5 F (36.4 C), temperature source Oral, resp. rate 13, height 5' 8.5" (1.74 m), weight 83.9 kg, SpO2 (!) 0 %.        Intake/Output Summary (Last 24 hours) at 10/25/2019 1529 Last data filed at 10/25/2019 1515 Gross per 24 hour  Intake 980 ml  Output 900 ml  Net 80 ml   Filed Weights   10/25/19 0650  Weight:  83.9 kg    Examination: General: critically ill appearing HENT: ETT Lungs: mechanical breath sounds appreciated bilaterally Cardiovascular: mid-sternotomy incision. Cannulated.  Abdomen: non-distended Extremities: cool Neuro: sedated GU: foley  Resolved Hospital Problem list   none  Assessment & Plan:   Shock--cardiogenic + distributive Decompensated heart failure s/p thoracotomy for repair of perforation of LAD during PCI. Unable to be weaned from bypass in OR Post operative anemia. hgb 7.0.  Plan Continue pressor and iatrogenic support with epi, levo, neo, milrinone, vasopressin hgb checks q4h. Transfuse for hgb <8 Resume AC in 4h starting low dose. Obtain ACT CVP monitoring.  ICM. HFrEF. CAD. HTN.  NSTEMI s/p DES to RCA 5/25.  Plan Hold home meds Strict I/o, tele monitoring, daily weights  Critical illness requiring mechanical ventilation.  Tobacco use--1ppd smoker Plan: continue PRVC. daily SBTs/WUA. VAP bundle.    Best practice:  Diet: NPO Pain/Anxiety/Delirium protocol (if indicated): fentynl VAP protocol (if indicated): yes DVT prophylaxis: heparin gtt GI prophylaxis: protonix Glucose control: per primary Mobility: BR Code Status: Full Family Communication: wife updated by primary Disposition: ICU  Labs   CBC: Recent Labs  Lab 10/25/19 1459  HGB 7.0*  HCT 21.4*  PLT 465    Basic Metabolic Panel: No results for input(s): NA, K, CL, CO2, GLUCOSE, BUN, CREATININE, CALCIUM, MG, PHOS in the last 168 hours. GFR: Estimated Creatinine Clearance: 88.4 mL/min (by C-G formula based on SCr of 1.1 mg/dL). No results for input(s): PROCALCITON, WBC, LATICACIDVEN in the last 168 hours.  Liver Function Tests: No  results for input(s): AST, ALT, ALKPHOS, BILITOT, PROT, ALBUMIN in the last 168 hours. No results for input(s): LIPASE, AMYLASE in the last 168 hours. No results for input(s): AMMONIA in the last 168 hours.  ABG    Component Value Date/Time    TCO2 25 09/29/2015 1727     Coagulation Profile: No results for input(s): INR, PROTIME in the last 168 hours.  Cardiac Enzymes: No results for input(s): CKTOTAL, CKMB, CKMBINDEX, TROPONINI in the last 168 hours.  HbA1C: Hgb A1c MFr Bld  Date/Time Value Ref Range Status  10/18/2019 05:32 AM 5.2 4.8 - 5.6 % Final    Comment:    (NOTE) Pre diabetes:          5.7%-6.4% Diabetes:              >6.4% Glycemic control for   <7.0% adults with diabetes     CBG: No results for input(s): GLUCAP in the last 168 hours.  Review of Systems:   Unable to be obtained due to critical illness  Past Medical History  He,  has a past medical history of Alcohol abuse, GERD (gastroesophageal reflux disease), and Tobacco abuse.   Surgical History    Past Surgical History:  Procedure Laterality Date  . CORONARY CTO INTERVENTION N/A 10/25/2019   Procedure: CORONARY CTO INTERVENTION;  Surgeon: Martinique, Peter M, MD;  Location: [facility];  Service: Cardiovascular;  Laterality: N/A;  . CORONARY STENT INTERVENTION N/A 10/17/2019   Procedure: CORONARY STENT INTERVENTION;  Surgeon: Sherren Mocha, MD;  Location: [facility];  Service: Cardiovascular;  Laterality: N/A;  . LEFT HEART CATH AND CORONARY ANGIOGRAPHY N/A 10/17/2019   Procedure: LEFT HEART CATH AND CORONARY ANGIOGRAPHY;  Surgeon: Sherren Mocha, MD;  Location: [facility];  Service: Cardiovascular;  Laterality: N/A;     Social History   reports that he has been smoking. He does not have any smokeless tobacco history on file. He reports current alcohol use. He reports that he does not use drugs.   Family History   His family history includes Heart attack in his father.   Allergies No Known Allergies   Home Medications  Prior to Admission medications   Medication Sig Start Date End Date Taking? Authorizing Provider  aspirin EC 81 MG EC tablet Take 1 tablet (81 mg total) by mouth daily. 10/19/19  Yes Cheryln Manly,  NP  atorvastatin (LIPITOR) 80 MG tablet Take 1 tablet (80 mg total) by mouth daily. 10/19/19  Yes Cheryln Manly, NP  cetirizine (ZYRTEC) 10 MG tablet Take 10 mg by mouth daily.   Yes [provider]  clopidogrel (PLAVIX) 75 MG tablet Take 1 tablet (75 mg total) by mouth daily. 10/19/19  Yes Cheryln Manly, NP  furosemide (LASIX) 20 MG tablet Take 1 tablet (20 mg total) by mouth daily as needed (for shortness of breath). 10/18/19  Yes Cheryln Manly, NP  losartan (COZAAR) 25 MG tablet Take 1 tablet (25 mg total) by mouth daily. 10/19/19  Yes Cheryln Manly, NP  nitroGLYCERIN (NITROSTAT) 0.4 MG SL tablet Place 1 tablet (0.4 mg total) under the tongue every 5 (five) minutes x 3 doses as needed for chest pain. 10/18/19   Cheryln Manly, NP     Mitzi Hansen, MD [facility] [place] PGY-1 10/25/19  3:29 PM

## 2019-10-25 NOTE — Progress Notes (Signed)
  Echocardiogram 2D Echocardiogram has been performed.  Jennette Dubin 10/25/2019, 12:18 PM

## 2019-10-25 NOTE — CV Procedure (Signed)
°  ECMO INITIATION   Patient: William Crawford, 1970-10-17, 49 y.o. Location: OR 16  Date of Service:  10/25/2019     Time: 6:37 PM  Date of Admission: 10/25/2019 Admitting diagnosis: Myocardial infarction acute (HCC)  Ht: 5' 8.5" (174 cm) Wt: 83.9 kg BSA: Body surface area is 2.01 meters squared.  Blood Type: Conflict (See Lab Report): A POS/A POS Performed at [facility], [place] [address], [place], [place] [address]  Allergies: No Known Allergies  Past medical history:  Past Medical History:  Diagnosis Date   Alcohol abuse    GERD (gastroesophageal reflux disease)    Tobacco abuse    Past surgical history:  Past Surgical History:  Procedure Laterality Date   CORONARY CTO INTERVENTION N/A 10/25/2019   Procedure: CORONARY CTO INTERVENTION;  Surgeon: Martinique, Peter M, MD;  Location: [place] [facility];  Service: Cardiovascular;  Laterality: N/A;   CORONARY STENT INTERVENTION N/A 10/17/2019   Procedure: CORONARY STENT INTERVENTION;  Surgeon: Sherren Mocha, MD;  Location: [place] [facility];  Service: Cardiovascular;  Laterality: N/A;   LEFT HEART CATH AND CORONARY ANGIOGRAPHY N/A 10/17/2019   Procedure: LEFT HEART CATH AND CORONARY ANGIOGRAPHY;  Surgeon: Sherren Mocha, MD;  Location: [place] [facility];  Service: Cardiovascular;  Laterality: N/A;    Indication for ECMO: Post-Cardiotomy  ECMO was deployed at 1359 and initiated at 1531  Anticoagulation achieved [place] 40086 units given to patient at 1325 (for CPB). Cannulated for VA and achieved initial ECMO   and ECMO  .    ECMO Cannula Information     Staff Present  Primary Perfusionist [place] Specialist Virgel Gess  Cannulating Physician Dr Prescott Gum   ECMO Lot Numbers  CardioHelp Console  1  Oxygenator [id number]  Tubing Pack [id number]  ECMO Goals  Flow goal    3.5-4LPM  Anticoagulation goal    0.3-0.5  Cardiac goal    MAP > 70  Respiratory goal     Normalize  Other goal       ECMO Handoff  Patient Information * Age Height Weight BSA IBW BMI  49 y.o. 5' 8.5" (174 cm)  (83.9 kg Body surface area is 2.01 meters squared. No data recorded Body mass index is 27.72 kg/m.   Review History * Primary Diagnosis   Myocardial infarction acute [facility])  Prior Cardiac Arrest within 24hrs of ECMO initiation? Yes, in OR prior to intubation and CPB  ECMO and MCS * Type ECMO Flow ECMO Sweep Gases    VA    3.4 LPM    3     Additional Mechanical Support Ventilator  Ventilation *  PC 10, PEEP 10 RR 10 FIO2 50        Cannula Size and Locations       Drainage 36/46 F 2 stage Venous, Rt Atrium   Return 20 F metal tip aortic    *Cannula(e) sutured and anchored, secured and dressed.   Labs and Imaging *  *Cannulation position verified via imaging on arrival to ICU. Concerns communicated to attending surgeon. Labs reviewed.   All ECMO safety checks complete. ECMO flowsheet initiated, applicable charges captured, LDA's entered/confirmed, imaging and labs verified, blood products available, and report given to Robbie Lis, RT, Specialist.

## 2019-10-25 NOTE — Anesthesia Procedure Notes (Addendum)
Procedure Name: Intubation Date/Time: 10/25/2019 1:21 PM Performed by: Effie Berkshire, MD Pre-anesthesia Checklist: Patient identified, Emergency Drugs available, Suction available, Patient being monitored and Timeout performed Patient Re-evaluated:Patient Re-evaluated prior to induction Oxygen Delivery Method: Ambu bag Preoxygenation: Pre-oxygenation with 100% oxygen Induction Type: IV induction and Rapid sequence Laryngoscope Size: Glidescope and 4 Grade View: Grade I Tube type: Subglottic suction tube Tube size: 8.0 mm Number of attempts: 1 Airway Equipment and Method: Stylet and Video-laryngoscopy Placement Confirmation: ETT inserted through vocal cords under direct vision,  breath sounds checked- equal and bilateral and CO2 detector Secured at: 23 cm Tube secured with: Tape Dental Injury: Teeth and Oropharynx as per pre-operative assessment  Comments: Emergently intubated during CPR in OR.

## 2019-10-25 NOTE — Anesthesia Preprocedure Evaluation (Addendum)
Anesthesia Evaluation  Patient identified by MRN, date of birth, ID band Patient awake    Reviewed: Allergy & Precautions, Patient's Chart, lab work & pertinent test results, Unable to perform ROS - Chart review onlyPreop documentation limited or incomplete due to emergent nature of procedure.  Airway Mallampati: III  TM Distance: >3 FB Neck ROM: Full    Dental  (+) Poor Dentition, Missing, Chipped,    Pulmonary Current Smoker,    Pulmonary exam normal        Cardiovascular hypertension, Pt. on medications + CAD and + Past MI   Rhythm:Regular Rate:Tachycardia     Neuro/Psych    GI/Hepatic   Endo/Other    Renal/GU      Musculoskeletal   Abdominal Normal abdominal exam  (+)   Peds  Hematology   Anesthesia Other Findings   Reproductive/Obstetrics                             Anesthesia Physical Anesthesia Plan  ASA: IV and emergent  Anesthesia Plan: General   Post-op Pain Management:    Induction: Intravenous  PONV Risk Score and Plan: 1 and Ondansetron  Airway Management Planned: Oral ETT  Additional Equipment: Arterial line, CVP, PA Cath, TEE and Ultrasound Guidance Line Placement  Intra-op Plan:   Post-operative Plan: Post-operative intubation/ventilation  Informed Consent: I have reviewed the patients History and Physical, chart, labs and discussed the procedure including the risks, benefits and alternatives for the proposed anesthesia with the patient or authorized representative who has indicated his/her understanding and acceptance.     History available from chart only, Only emergency history available and Consent reviewed with POA  Plan Discussed with: CRNA  Anesthesia Plan Comments: (Discussed anesthesia and risk with mother. )       Anesthesia Quick Evaluation

## 2019-10-25 NOTE — Progress Notes (Signed)
Pharmacy Antibiotic Note  William Crawford is a 49 y.o. male admitted on 10/25/2019 with VA ECMO.  Pharmacy has been consulted for Vancomycin and Cefepime dosing.  Patient received Cefuroxime 1590m at 1405, followed by 7536mat 1644 in OR. Received Vancomycin 150046mV x1 at 1405.   SCr 0.99 - est CrCl ~98 mL/min.  WBC wnl. LA down 3.2.   Plan: Cefepime 2g IV every 8 hours.  Vancomycin 1g IV now (in addition to prior 1500m62mhen 1000 every 8 hours (goal trough 15-20).  Monitor renal function, culture results, and clinical status  Height: 5' 8.5" (174 cm) Weight: 83.9 kg (185 lb) IBW/kg (Calculated) : 69.55  Temp (24hrs), Avg:97.3 F (36.3 C), Min:97 F (36.1 C), Max:97.5 F (36.4 C)  No results for input(s): WBC, CREATININE, LATICACIDVEN, VANCOTROUGH, VANCOPEAK, VANCORANDOM, GENTTROUGH, GENTPEAK, GENTRANDOM, TOBRATROUGH, TOBRAPEAK, TOBRARND, AMIKACINPEAK, AMIKACINTROU, AMIKACIN in the last 168 hours.  Estimated Creatinine Clearance: 88.4 mL/min (by C-G formula based on SCr of 1.1 mg/dL).    No Known Allergies  Antimicrobials this admission: Cefuroxime 6/2 x2 doses Cefepime 6/2 >> Vancomycin 6/2 >>  Dose adjustments this admission:   Microbiology results:   Thank you for allowing pharmacy to be a part of this patients care.  JessSloan LeiterarmD, BCPS, BCCCP Clinical Pharmacist Please refer to [facility] [place] 10/25/2019 7:16 PM

## 2019-10-25 NOTE — Consult Note (Addendum)
Advanced Heart Failure Team Consult Note   Primary Physician: Sherren Mocha, MD PCP-Cardiologist:  No primary care provider on file.  Reason for Consultation: ECMO initiation  HPI:    William Crawford is seen today for evaluation of cardiogenic shock at the request of Dr. Prescott Gum.   49 y.o. with history of heavy smoking, heavy ETOH, and strong FH of CAD was initially admitted on 10/17/19 with chest pain and NSTEMI.  LHC showed 95% distal RCA (culprit) with chronically occluded proximal LAD and OM1 with collaterals.  He had DES to distal RCA initially.  Echo showed EF 35-40% with inferior wall motion abnormality and normal RV.  He was discharged home with plan for CTO intervention on LAD.  He came for elective CTO procedure today, however he had perforation of the LAD with balloon inflation.  A covered stent was placed.  Echo showed EF 45% with apical hypokinesis, moderate pericardial effusion and evidence for tamponade.  He was then taken to the OR for LAD repair.   In the OR, he had a VF arrest with about 15 minutes of CPR.  He was put on cardiopulmonary bypass and the tear in the LAD was repaired.  The LAD was not graftable.  TEE in the OR showed severe biventricular failure, suspect stunning post-VF arrest.  He was unable to come off bypass.  Therefore, he was cannulated for ECMO with venous drainage from RA and arterial return to ascending aorta.  LV vent was not placed.   He is now back in [place] and stable with MAP 83.  He is on norepinephrine 2, epinephrine 1, vasopressin 0.03, and lidocaine 2.  CVP 13.   ECMO parameters:  Speed: 3000 rpm Flow 3.9 L/min -26 mmHg Delta P 14  Review of Systems: Unable to obtain, intubated/sedated  Home Medications Prior to Admission medications   Medication Sig Start Date End Date Taking? Authorizing Provider  aspirin EC 81 MG EC tablet Take 1 tablet (81 mg total) by mouth daily. 10/19/19  Yes Cheryln Manly, NP  atorvastatin (LIPITOR) 80  MG tablet Take 1 tablet (80 mg total) by mouth daily. 10/19/19  Yes Cheryln Manly, NP  cetirizine (ZYRTEC) 10 MG tablet Take 10 mg by mouth daily.   Yes [provider]  clopidogrel (PLAVIX) 75 MG tablet Take 1 tablet (75 mg total) by mouth daily. 10/19/19  Yes Cheryln Manly, NP  furosemide (LASIX) 20 MG tablet Take 1 tablet (20 mg total) by mouth daily as needed (for shortness of breath). 10/18/19  Yes Cheryln Manly, NP  losartan (COZAAR) 25 MG tablet Take 1 tablet (25 mg total) by mouth daily. 10/19/19  Yes Cheryln Manly, NP  nitroGLYCERIN (NITROSTAT) 0.4 MG SL tablet Place 1 tablet (0.4 mg total) under the tongue every 5 (five) minutes x 3 doses as needed for chest pain. 10/18/19   Cheryln Manly, NP    Past Medical History: Past Medical History:  Diagnosis Date  . Alcohol abuse   . GERD (gastroesophageal reflux disease)   . Tobacco abuse     Past Surgical History: Past Surgical History:  Procedure Laterality Date  . CORONARY CTO INTERVENTION N/A 10/25/2019   Procedure: CORONARY CTO INTERVENTION;  Surgeon: Martinique, Peter M, MD;  Location: [place] [facility];  Service: Cardiovascular;  Laterality: N/A;  . CORONARY STENT INTERVENTION N/A 10/17/2019   Procedure: CORONARY STENT INTERVENTION;  Surgeon: Sherren Mocha, MD;  Location: [place] [facility];  Service:  Cardiovascular;  Laterality: N/A;  . LEFT HEART CATH AND CORONARY ANGIOGRAPHY N/A 10/17/2019   Procedure: LEFT HEART CATH AND CORONARY ANGIOGRAPHY;  Surgeon: Sherren Mocha, MD;  Location: [place] [facility];  Service: Cardiovascular;  Laterality: N/A;    Family History: Family History  Problem Relation Age of Onset  . Heart attack Father     Social History: Social History   Socioeconomic History  . Marital status: Married    Spouse name: Not on file  . Number of children: Not on file  . Years of education: Not on file  . Highest education level: Not on file  Occupational History  . Not on  file  Tobacco Use  . Smoking status: Current Every Day Smoker  Substance and Sexual Activity  . Alcohol use: Yes  . Drug use: No  . Sexual activity: Not on file  Other Topics Concern  . Not on file  Social History Narrative  . Not on file   Social Determinants of Health   Financial Resource Strain:   . Difficulty of Paying Living Expenses:   Food Insecurity:   . Worried About Charity fundraiser in the Last Year:   . Arboriculturist in the Last Year:   Transportation Needs:   . Film/video editor (Medical):   Marland Kitchen Lack of Transportation (Non-Medical):   Physical Activity:   . Days of Exercise per Week:   . Minutes of Exercise per Session:   Stress:   . Feeling of Stress :   Social Connections:   . Frequency of Communication with Friends and Family:   . Frequency of Social Gatherings with Friends and Family:   . Attends Religious Services:   . Active Member of Clubs or Organizations:   . Attends Archivist Meetings:   Marland Kitchen Marital Status:     Allergies:  No Known Allergies  Objective:    Vital Signs:   Temp:  [97 F (36.1 C)-97.5 F (36.4 C)] 97 F (36.1 C) (06/02 1845) Pulse Rate:  [50-110] 90 (06/02 1840) Resp:  [0-51] 10 (06/02 1845) BP: (72-176)/(45-115) 108/77 (06/02 1840) SpO2:  [0 %-100 %] 100 % (06/02 1840) FiO2 (%):  [50 %] 50 % (06/02 1840) Weight:  [83.9 kg] 83.9 kg (06/02 0650)    Weight change: Filed Weights   10/25/19 0650  Weight: 83.9 kg    Intake/Output:   Intake/Output Summary (Last 24 hours) at 10/25/2019 1939 Last data filed at 10/25/2019 1900 Gross per 24 hour  Intake 5858 ml  Output 3250 ml  Net 2608 ml      Physical Exam    General:  Intubated/sedated.  HEENT: normal Neck: supple. JVP not elevated.  Cor: PMI nondisplaced. Regular rate & rhythm. No rubs, gallops or murmurs. Lungs: clear Abdomen: soft, nondistended. No hepatosplenomegaly. No bruits or masses. Good bowel sounds. Extremities: No edema, mild mottling  in feet but pulses dopplerable. Neuro: Sedated    Telemetry   A-V sequential pacing (personally reviewed)  Labs   Basic Metabolic Panel: No results for input(s): NA, K, CL, CO2, GLUCOSE, BUN, CREATININE, CALCIUM, MG, PHOS in the last 168 hours.  Liver Function Tests: No results for input(s): AST, ALT, ALKPHOS, BILITOT, PROT, ALBUMIN in the last 168 hours. No results for input(s): LIPASE, AMYLASE in the last 168 hours. No results for input(s): AMMONIA in the last 168 hours.  CBC: Recent Labs  Lab 10/25/19 1459 10/25/19 1859  WBC  --  7.3  HGB 7.0*  7.8*  HCT 21.4* 23.5*  MCV  --  91.1  PLT 249 216    Cardiac Enzymes: No results for input(s): CKTOTAL, CKMB, CKMBINDEX, TROPONINI in the last 168 hours.  BNP: BNP (last 3 results) Recent Labs    10/17/19 0605  BNP 608.8*    ProBNP (last 3 results) No results for input(s): PROBNP in the last 8760 hours.   CBG: No results for input(s): GLUCAP in the last 168 hours.  Coagulation Studies: No results for input(s): LABPROT, INR in the last 72 hours.   Imaging   CARDIAC CATHETERIZATION  Result Date: 10/25/2019  Mid LAD lesion is 100% stenosed.  1st Mrg lesion is 100% stenosed.  Mid Cx to Dist Cx lesion is 50% stenosed.  Prox RCA to Mid RCA lesion is 35% stenosed.  A covered stent placed using a STENT PK PAPYRUS 2.5X20. and a 2.5 x 15 mm PK PAPYRUS with unsuccessful sealing of the perforation.  1. Unsuccessful CTO PCI of the LAD. Able to cross the lesion with a wire. With initial balloon inflation with a 2.0 mm balloon the patient developed a large perforation. We were unable to seal the perforation with prolonged balloon inflations and placement of Covered stents x 2. Balloon left inflated in the vessel to tamponade the perforation until definitive surgery done. Plan emergent surgery for oversewing of the perforation.   DG Chest Portable 1 View  Result Date: 10/25/2019 CLINICAL DATA:  49 year old male status post open  heart surgery. EXAM: PORTABLE CHEST 1 VIEW COMPARISON:  Earlier radiograph dated 10/25/2019 FINDINGS: The right IJ Swan-Ganz has been advanced with tip now positioned in the right main pulmonary artery. Enteric tube remains folded in the distal esophagus. Recommend retraction and repositioning. No other interval change. IMPRESSION: 1. Interval advancement of the right IJ Swan-Ganz with tip in the right main pulmonary artery. 2. Enteric tube remains folded in the distal esophagus. Electronically Signed   By: Anner Crete M.D.   On: 10/25/2019 18:13   DG Chest Portable 1 View  Result Date: 10/25/2019 CLINICAL DATA:  49 year old male with open heart surgery EXAM: PORTABLE CHEST 1 VIEW COMPARISON:  Chest radiograph dated 10/17/2019 and follow-up radiograph dated 10/25/2019. FINDINGS: Postsurgical changes of median sternotomy. Evaluation of the tube is limited due to superimposition of the support apparatus. An endotracheal tube is noted. The tip of the endotracheal tube is not identified. An enteric tube is seen extending into the distal esophagus and looping back with side-port over the T1 and tip beyond the upper margin of the image. Recommend removal and repositioning. The right IJ Swan-Ganz catheter with tip likely in the right ventricle. This has been advanced on the follow-up radiograph. There is a left-sided chest tube. Diffuse bilateral interstitial and peribronchial densities. No focal consolidation, large pleural effusion, or pneumothorax. Mild cardiomegaly. IMPRESSION: 1. Open heart surgery. 2. Right IJ Swan-Ganz catheter with tip in the right ventricle. This has been advanced on the follow-up image. 3. Enteric tube folded in the distal esophagus. Recommend retraction and repositioning. Electronically Signed   By: Anner Crete M.D.   On: 10/25/2019 18:11   ECHOCARDIOGRAM LIMITED  Result Date: 10/25/2019    ECHOCARDIOGRAM REPORT   Patient Name:   MIRIAM KESTLER Date of Exam: 10/25/2019 Medical  Rec #:  [id number]         Height:       68.5 in Accession #:    [id number]        Weight:  185.0 lb Date of Birth:  14-May-1971         BSA:          1.988 m Patient Age:    66 years          BP:           119/92 mmHg Patient Gender: M                 HR:           85 bpm. Exam Location:  Inpatient Procedure: Limited Echo and Echo Assisted Procedure STAT ECHO Indications:    Pericardial Effusion  History:        Patient has prior history of Echocardiogram examinations, most                 recent 10/17/2019. Risk Factors:Current Smoker and Alcohol Abuse.  Sonographer:    Mikki Santee RDCS (AE) Referring Phys: [place]  1. Left ventricular ejection fraction, by estimation, is 45%. The left ventricle has mildly decreased function. The left ventricle demonstrates regional wall motion abnormalities, there is hypokinesis of the apical septum, the apical anterior wall, and the true apex. There is mild left ventricular hypertrophy. Left ventricular diastolic parameters are indeterminate.  2. Right ventricular systolic function is normal. The right ventricular size is normal.  3. The mitral valve is normal in structure. No doppler mitral valve regurgitation.  4. Tricuspid valve regurgitation No doppler.  5. The aortic valve was not well visualized. Aortic valve regurgitation is not visualized. No aortic stenosis is present.  6. Moderate pericardial effusion. The IVC is not dilated but there does appear to be a degree of RV diastolic collapse suggesting tamponade. FINDINGS  Left Ventricle: Left ventricular ejection fraction, by estimation, is 45%. The left ventricle has mildly decreased function. The left ventricle demonstrates regional wall motion abnormalities. The left ventricular internal cavity size was normal in size. There is mild left ventricular hypertrophy. Left ventricular diastolic parameters are indeterminate. Right Ventricle: The right ventricular size is normal. No increase  in right ventricular wall thickness. Right ventricular systolic function is normal. Left Atrium: Left atrial size was normal in size. Right Atrium: Right atrial size was normal in size. Pericardium: Moderate pericardial effusion. The IVC is not dilated but there does appear to be a degree of RV diastolic collapse suggesting tamponade. A moderately sized pericardial effusion is present. Mitral Valve: The mitral valve is normal in structure. No doppler mitral valve regurgitation. Tricuspid Valve: The tricuspid valve is normal in structure. Tricuspid valve regurgitation No doppler. Aortic Valve: The aortic valve was not well visualized. Aortic valve regurgitation is not visualized. No aortic stenosis is present. Pulmonic Valve: The pulmonic valve was not well visualized. Pulmonic valve regurgitation is not visualized. Aorta: The aortic root is normal in size and structure. IAS/Shunts: No atrial level shunt detected by color flow Doppler. Loralie Champagne MD Electronically signed by Loralie Champagne MD Signature Date/Time: 10/25/2019/5:41:19 PM    Final       Medications:     Current Medications: . sodium chloride   Intravenous Once  . sodium chloride   Intravenous Once  . [START ON 10/26/2019] acetaminophen  1,000 mg Oral Q6H   Or  . [START ON 10/26/2019] acetaminophen (TYLENOL) oral liquid 160 mg/5 mL  1,000 mg Per Tube Q6H  . acetaminophen (TYLENOL) oral liquid 160 mg/5 mL  650 mg Per Tube Once   Or  . acetaminophen  650 mg Rectal Once  . [  START ON 10/26/2019] aspirin EC  325 mg Oral Daily   Or  . [START ON 10/26/2019] aspirin  324 mg Per Tube Daily  . [START ON 10/26/2019] bisacodyl  10 mg Oral Daily   Or  . [START ON 10/26/2019] bisacodyl  10 mg Rectal Daily  . chlorhexidine  15 mL Mouth/Throat NOW  . [START ON 10/26/2019] docusate sodium  200 mg Oral Daily  . folic acid  1 mg Intravenous Daily  . metoprolol tartrate  12.5 mg Oral BID   Or  . metoprolol tartrate  12.5 mg Per Tube BID  . pantoprazole  (PROTONIX) IV  40 mg Intravenous QHS  . [START ON 10/26/2019] sodium chloride flush  3 mL Intravenous Q12H  . thiamine injection  100 mg Intravenous Daily     Infusions: . sodium chloride    . sodium chloride    . [START ON 10/26/2019] sodium chloride    . sodium chloride    . sodium chloride    . albumin human    . ceFEPime (MAXIPIME) IV    . dexmedetomidine (PRECEDEX) IV infusion    . dextrose 5 % and 0.45% NaCl    . epinephrine    . famotidine (PEPCID) IV    . HYDROmorphone    . insulin    . lactated ringers    . lactated ringers    . lactated ringers    . magnesium sulfate    . midazolam    . milrinone    . nitroGLYCERIN    . norepinephrine (LEVOPHED) Adult infusion    . phenylephrine (NEO-SYNEPHRINE) Adult infusion    . potassium chloride    . vancomycin    . vasopressin (PITRESSIN) infusion - *FOR SHOCK*        Assessment/Plan   1. Cardiogenic shock: Pre-OR echo with EF 45%, apical hypokinesis.  TEE in OR after VF arrest showed severe biventricular dysfunction.  Now on ECMO. Currently stable with good flow.  He remains on norepinephrine 2, epinephrine 1, vasopressin 0.03. MAP stable, CVP 13. Will plan to rest on ECMO for now, allow LV/RV time to recover from VF arrest/stunning.  - Low dose heparin gtt to start tonight for ECMO circuit per Dr. Prescott Gum.  - Continue pressors as needed.  - Echo in am.  - Follow LDH, lactate, CMET daily.  - Vancomycin/cefepime started ECMO prophylaxis.  2. Pericardial tamponade: Due to LAD perforation.  Now resolved in OR.  3. CAD: S/p DES to distal RCA on 5/25.  Has known CTO OM.  Had attempted intervention on CTO LAD today that was complicated by LAD perforation, now s/p LAD repair.  The LAD was not graftable, LAD territory therefore is not perfused except by pre-existing collaterals.  - Continue ASA.  - Restart Plavix in am if ok with Dr. Prescott Gum.  - Restart statin in am if LFTs not markedly abnormal.  4. ETOH abuse: Will need  counseling to quit.  5. Smoking: Will need to quit.   Length of Stay: 0  Loralie Champagne, MD  10/25/2019, 7:39 PM  Advanced Heart Failure Team Pager [phone] (M-F; 7a - 4p)  Please contact [facility] for night-coverage after hours (4p -7a ) and weekends on [email or web address]

## 2019-10-25 NOTE — Anesthesia Postprocedure Evaluation (Signed)
Anesthesia Post Note  Patient: MARINA DESIRE  Procedure(s) Performed: TRANSESOPHAGEAL ECHOCARDIOGRAM (TEE) (N/A ) Median Sternotomy. Cannulation For Ecmo (Extracorporeal Membrane Oxygenation) @ 8257 Application Of Wound Vac Chest Exploration for repair of coronary perforation (repair of LAD)     Patient location during evaluation: SICU Anesthesia Type: General Level of consciousness: sedated Pain management: pain level controlled Vital Signs Assessment: post-procedure vital signs reviewed and stable Respiratory status: patient remains intubated per anesthesia plan Cardiovascular status: stable Postop Assessment: no apparent nausea or vomiting Anesthetic complications: no    Last Vitals:  Vitals:   10/25/19 1840 10/25/19 1845  BP: 108/77   Pulse: 90   Resp: 10 10  Temp:  (!) 36.1 C  SpO2: 100%     Last Pain:  Vitals:   10/25/19 1845  TempSrc: Core  PainSc:                  Karyl Kinnier Daysean Tinkham

## 2019-10-25 NOTE — Anesthesia Procedure Notes (Signed)
Central Venous Catheter Insertion Performed by: Effie Berkshire, MD, anesthesiologist Start/End6/06/2019 1:38 PM, 10/25/2019 1:50 PM Patient location: Pre-op. Preanesthetic checklist: patient identified, IV checked, site marked, risks and benefits discussed, surgical consent, monitors and equipment checked, pre-op evaluation, timeout performed and anesthesia consent Position: Trendelenburg Lidocaine 1% used for infiltration and patient sedated Hand hygiene performed , maximum sterile barriers used  and Seldinger technique used Catheter size: 9 Fr Total catheter length 10. Central line was placed.MAC introducer Swan type:thermodilution PA Cath depth:50 Procedure performed using ultrasound guided technique. Ultrasound Notes:anatomy identified, needle tip was noted to be adjacent to the nerve/plexus identified, no ultrasound evidence of intravascular and/or intraneural injection and image(s) printed for medical record Attempts: 1 Following insertion, line sutured and dressing applied. Post procedure assessment: blood return through all ports, free fluid flow and no air  Patient tolerated the procedure well with no immediate complications.

## 2019-10-25 NOTE — Brief Op Note (Signed)
10/25/2019  4:13 PM  PATIENT:  William Crawford  49 y.o. male  PRE-OPERATIVE DIAGNOSIS:  CAD  POST-OPERATIVE DIAGNOSIS:  CAD  PROCEDURE:   Emergency sternotomy for placement on cardiopulmonary bypass and drainage of cardiac tamponade Repair of LAD perforation and bleeding Cannulation for VA ECMO following failure to wean from cardiopulmonary bypass Open sternum with wound VAC placement  SURGEON:  Surgeon(s) and Role:    [name], MD - Primary    * Orvan Seen, Glenice Bow, MD - Assisting  PHYSICIAN ASSISTANT: WAYNE GOLD PA-C  ANESTHESIA:   general  EBL: 1.2 L scavenged through [employer] and readministered  BLOOD ADMINISTERED:SEE PERFUSION AND ANESTHESIA RECORDS  DRAINS: 3 CHEST TUBES IN MEDIASTINUM AND PLEURAL SPACE  LOCAL MEDICATIONS USED:  NONE  SPECIMEN: PCI catheter and balloon device present in the pericardial space  DISPOSITION OF SPECIMEN:  N/A  COUNTS:  YES  TOURNIQUET: None DICTATION: Note dictated  PLAN OF CARE: Admit to inpatient   PATIENT DISPOSITION:  ICU ON ECMO IN GUARDED CONDITION   Delay start of Pharmacological VTE agent (>24hrs) due to surgical blood loss or risk of bleeding: yes

## 2019-10-25 NOTE — Interval H&P Note (Signed)
History and Physical Interval Note:  10/25/2019 7:24 AM  [name]  has presented today for surgery, with the diagnosis of cad.  The various methods of treatment have been discussed with the patient and family. After consideration of risks, benefits and other options for treatment, the patient has consented to  Procedure(s): CORONARY CTO INTERVENTION (N/A) as a surgical intervention.  The patient's history has been reviewed, patient examined, no change in status, stable for surgery.  I have reviewed the patient's chart and labs.  Questions were answered to the patient's satisfaction.   Cath Lab Visit (complete for each Cath Lab visit)  Clinical Evaluation Leading to the Procedure:   ACS: No.  Non-ACS:    Anginal Classification: CCS III  Anti-ischemic medical therapy: Minimal Therapy (1 class of medications)  Non-Invasive Test Results: No non-invasive testing performed  Prior CABG: No previous CABG        Collier Salina [facility] 10/25/2019 7:24 AM

## 2019-10-25 NOTE — Progress Notes (Signed)
6 french slender right radial sheath removed and TR band placed with 12cc's of air in the band.  Site looks good.  Level 0.

## 2019-10-25 NOTE — Progress Notes (Signed)
ANTICOAGULATION CONSULT NOTE - Initial Consult  Pharmacy Consult for IV Heparin Indication: ECMO  No Known Allergies  Patient Measurements: Height: 5' 8.5" (174 cm) Weight: 83.9 kg (185 lb) IBW/kg (Calculated) : 69.55 Heparin Dosing Weight: 83.9 kg  Vital Signs: Temp: 97 F (36.1 C) (06/02 1845) Temp Source: Core (06/02 1845) BP: 108/77 (06/02 1840) Pulse Rate: 90 (06/02 1840)  Labs: Recent Labs    10/25/19 1459 10/25/19 1859  HGB 7.0* 7.8*  HCT 21.4* 23.5*  PLT 249 216  APTT  --  47*  LABPROT  --  15.1  INR  --  1.2    Estimated Creatinine Clearance: 88.4 mL/min (by C-G formula based on SCr of 1.1 mg/dL).   Medical History: Past Medical History:  Diagnosis Date  . Alcohol abuse   . GERD (gastroesophageal reflux disease)   . Tobacco abuse     Assessment: 49 year old male initiated on [place] ECMO at 47. Re[place] bol[place] 9[address]38 units at 1325. Hemoglobin low at 7 >> 7.8. Platelets 216.   Discussed with Dr. Prescott Gum - awaiting 4 hours to start Heparin systemically (11 PM) at 500 units/hr without titration overnight.   Goal of Therapy:  Heparin level 0.3 to 0.5 units/ml Monitor platelets by anticoagulation protocol: Yes   Plan:  Start Heparin IV at 500 units/hr per Dr. Prescott Gum at 2330 PM. Heparin level every 6 hours. *Titration per Dr. Prescott Gum. Monitor DeltaP Will continue Heparin levels every 6 hours until therapeutic x2 then every 12 hours thereafter.   Sloan Leiter, PharmD, BCPS, BCCCP Clinical Pharmacist Please refer to [facility] for William Crawford numbers 10/25/2019,7:40 PM

## 2019-10-26 ENCOUNTER — Inpatient Hospital Stay (HOSPITAL_COMMUNITY): Payer: BLUE CROSS/BLUE SHIELD

## 2019-10-26 ENCOUNTER — Encounter: Payer: Self-pay | Admitting: *Deleted

## 2019-10-26 DIAGNOSIS — I9751 Accidental puncture and laceration of a circulatory system organ or structure during a circulatory system procedure: Secondary | ICD-10-CM

## 2019-10-26 DIAGNOSIS — I5021 Acute systolic (congestive) heart failure: Secondary | ICD-10-CM

## 2019-10-26 LAB — COMPREHENSIVE METABOLIC PANEL
ALT: 86 U/L — ABNORMAL HIGH (ref 0–44)
AST: 249 U/L — ABNORMAL HIGH (ref 15–41)
Albumin: 2.5 g/dL — ABNORMAL LOW (ref 3.5–5.0)
Alkaline Phosphatase: 37 U/L — ABNORMAL LOW (ref 38–126)
Anion gap: 7 (ref 5–15)
BUN: 8 mg/dL (ref 6–20)
CO2: 26 mmol/L (ref 22–32)
Calcium: 7.3 mg/dL — ABNORMAL LOW (ref 8.9–10.3)
Chloride: 108 mmol/L (ref 98–111)
Creatinine, Ser: 0.9 mg/dL (ref 0.61–1.24)
GFR calc Af Amer: >60 mL/min (ref >60)
GFR calc non Af Amer: >60 mL/min (ref >60)
Glucose, Bld: 117 mg/dL — ABNORMAL HIGH (ref 70–99)
Potassium: 4.1 mmol/L (ref 3.5–5.1)
Sodium: 141 mmol/L (ref 135–145)
Total Bilirubin: 1.1 mg/dL (ref 0.3–1.2)
Total Protein: 4.4 g/dL — ABNORMAL LOW (ref 6.5–8.1)

## 2019-10-26 LAB — ECHOCARDIOGRAM COMPLETE
Height: 68.5 in
Weight: 2960 oz

## 2019-10-26 LAB — POCT I-STAT 7, (LYTES, BLD GAS, ICA,H+H)
Acid-Base Excess: 2 mmol/L (ref 0.0–2.0)
Acid-Base Excess: 4 mmol/L — ABNORMAL HIGH (ref 0.0–2.0)
Acid-Base Excess: 3 mmol/L — ABNORMAL HIGH (ref 0.0–2.0)
Acid-Base Excess: 3 mmol/L — ABNORMAL HIGH (ref 0.0–2.0)
Acid-Base Excess: 4 mmol/L — ABNORMAL HIGH (ref 0.0–2.0)
Bicarbonate: 29.4 mmol/L — ABNORMAL HIGH (ref 20.0–28.0)
Bicarbonate: 29.8 mmol/L — ABNORMAL HIGH (ref 20.0–28.0)
Bicarbonate: 28.5 mmol/L — ABNORMAL HIGH (ref 20.0–28.0)
Bicarbonate: 28.7 mmol/L — ABNORMAL HIGH (ref 20.0–28.0)
Bicarbonate: 28.9 mmol/L — ABNORMAL HIGH (ref 20.0–28.0)
Calcium, Ion: 1.16 mmol/L (ref 1.15–1.40)
Calcium, Ion: 1.16 mmol/L (ref 1.15–1.40)
Calcium, Ion: 1.12 mmol/L — ABNORMAL LOW (ref 1.15–1.40)
Calcium, Ion: 1.17 mmol/L (ref 1.15–1.40)
Calcium, Ion: 1.16 mmol/L (ref 1.15–1.40)
HCT: 23 % — ABNORMAL LOW (ref 39.0–52.0)
HCT: 22 % — ABNORMAL LOW (ref 39.0–52.0)
HCT: 23 % — ABNORMAL LOW (ref 39.0–52.0)
HCT: 21 % — ABNORMAL LOW (ref 39.0–52.0)
HCT: 22 % — ABNORMAL LOW (ref 39.0–52.0)
Hemoglobin: 7.8 g/dL — ABNORMAL LOW (ref 13.0–17.0)
Hemoglobin: 7.5 g/dL — ABNORMAL LOW (ref 13.0–17.0)
Hemoglobin: 7.8 g/dL — ABNORMAL LOW (ref 13.0–17.0)
Hemoglobin: 7.1 g/dL — ABNORMAL LOW (ref 13.0–17.0)
Hemoglobin: 7.5 g/dL — ABNORMAL LOW (ref 13.0–17.0)
O2 Saturation: 100 %
O2 Saturation: 100 %
O2 Saturation: 100 %
O2 Saturation: 100 %
O2 Saturation: 100 %
Patient temperature: 36.5
Patient temperature: 36.5
Patient temperature: 36.4
Patient temperature: 36.3
Patient temperature: 36.7
Potassium: 5.1 mmol/L (ref 3.5–5.1)
Potassium: 4.4 mmol/L (ref 3.5–5.1)
Potassium: 4.1 mmol/L (ref 3.5–5.1)
Potassium: 3.7 mmol/L (ref 3.5–5.1)
Potassium: 3.6 mmol/L (ref 3.5–5.1)
Sodium: 142 mmol/L (ref 135–145)
Sodium: 142 mmol/L (ref 135–145)
Sodium: 143 mmol/L (ref 135–145)
Sodium: 140 mmol/L (ref 135–145)
Sodium: 142 mmol/L (ref 135–145)
TCO2: 31 mmol/L (ref 22–32)
TCO2: 31 mmol/L (ref 22–32)
TCO2: 30 mmol/L (ref 22–32)
TCO2: 30 mmol/L (ref 22–32)
TCO2: 30 mmol/L (ref 22–32)
pCO2 arterial: 58.3 mmHg — ABNORMAL HIGH (ref 32.0–48.0)
pCO2 arterial: 51.4 mmHg — ABNORMAL HIGH (ref 32.0–48.0)
pCO2 arterial: 45.6 mmHg (ref 32.0–48.0)
pCO2 arterial: 47.3 mmHg (ref 32.0–48.0)
pCO2 arterial: 44.8 mmHg (ref 32.0–48.0)
pH, Arterial: 7.308 — ABNORMAL LOW (ref 7.350–7.450)
pH, Arterial: 7.368 (ref 7.350–7.450)
pH, Arterial: 7.401 (ref 7.350–7.450)
pH, Arterial: 7.388 (ref 7.350–7.450)
pH, Arterial: 7.417 (ref 7.350–7.450)
pO2, Arterial: 420 mmHg — ABNORMAL HIGH (ref 83.0–108.0)
pO2, Arterial: 429 mmHg — ABNORMAL HIGH (ref 83.0–108.0)
pO2, Arterial: 379 mmHg — ABNORMAL HIGH (ref 83.0–108.0)
pO2, Arterial: 316 mmHg — ABNORMAL HIGH (ref 83.0–108.0)
pO2, Arterial: 332 mmHg — ABNORMAL HIGH (ref 83.0–108.0)

## 2019-10-26 LAB — CBC
HCT: 26.2 % — ABNORMAL LOW (ref 39.0–52.0)
HCT: 24.4 % — ABNORMAL LOW (ref 39.0–52.0)
HCT: 24.6 % — ABNORMAL LOW (ref 39.0–52.0)
HCT: 25.7 % — ABNORMAL LOW (ref 39.0–52.0)
Hemoglobin: 8.6 g/dL — ABNORMAL LOW (ref 13.0–17.0)
Hemoglobin: 8.2 g/dL — ABNORMAL LOW (ref 13.0–17.0)
Hemoglobin: 8.2 g/dL — ABNORMAL LOW (ref 13.0–17.0)
Hemoglobin: 8.7 g/dL — ABNORMAL LOW (ref 13.0–17.0)
MCH: 29.1 pg (ref 26.0–34.0)
MCH: 29.4 pg (ref 26.0–34.0)
MCH: 29.1 pg (ref 26.0–34.0)
MCH: 29.4 pg (ref 26.0–34.0)
MCHC: 32.8 g/dL (ref 30.0–36.0)
MCHC: 33.6 g/dL (ref 30.0–36.0)
MCHC: 33.3 g/dL (ref 30.0–36.0)
MCHC: 33.9 g/dL (ref 30.0–36.0)
MCV: 88.5 fL (ref 80.0–100.0)
MCV: 87.5 fL (ref 80.0–100.0)
MCV: 87.2 fL (ref 80.0–100.0)
MCV: 86.8 fL (ref 80.0–100.0)
Platelets: 234 10*3/uL (ref 150–400)
Platelets: 197 10*3/uL (ref 150–400)
Platelets: 164 10*3/uL (ref 150–400)
Platelets: 153 10*3/uL (ref 150–400)
RBC: 2.96 MIL/uL — ABNORMAL LOW (ref 4.22–5.81)
RBC: 2.79 MIL/uL — ABNORMAL LOW (ref 4.22–5.81)
RBC: 2.82 MIL/uL — ABNORMAL LOW (ref 4.22–5.81)
RBC: 2.96 MIL/uL — ABNORMAL LOW (ref 4.22–5.81)
RDW: 13.1 % (ref 11.5–15.5)
RDW: 13.1 % (ref 11.5–15.5)
RDW: 13.7 % (ref 11.5–15.5)
RDW: 13.6 % (ref 11.5–15.5)
WBC: 6.2 10*3/uL (ref 4.0–10.5)
WBC: 5.8 10*3/uL (ref 4.0–10.5)
WBC: 6.1 10*3/uL (ref 4.0–10.5)
WBC: 6.1 10*3/uL (ref 4.0–10.5)
nRBC: 0 % (ref 0.0–0.2)
nRBC: 0 % (ref 0.0–0.2)
nRBC: 0 % (ref 0.0–0.2)
nRBC: 0 % (ref 0.0–0.2)

## 2019-10-26 LAB — BPAM PLATELET PHERESIS
Blood Product Expiration Date: 202106032359
Blood Product Expiration Date: 202106032359
ISSUE DATE / TIME: 202106021420
ISSUE DATE / TIME: 202106021420
Unit Type and Rh: 5100
Unit Type and Rh: 6200

## 2019-10-26 LAB — GLUCOSE, CAPILLARY
Glucose-Capillary: 100 mg/dL — ABNORMAL HIGH (ref 70–99)
Glucose-Capillary: 102 mg/dL — ABNORMAL HIGH (ref 70–99)
Glucose-Capillary: 107 mg/dL — ABNORMAL HIGH (ref 70–99)
Glucose-Capillary: 112 mg/dL — ABNORMAL HIGH (ref 70–99)
Glucose-Capillary: 128 mg/dL — ABNORMAL HIGH (ref 70–99)
Glucose-Capillary: 91 mg/dL (ref 70–99)
Glucose-Capillary: 99 mg/dL (ref 70–99)
Glucose-Capillary: 103 mg/dL — ABNORMAL HIGH (ref 70–99)
Glucose-Capillary: 85 mg/dL (ref 70–99)
Glucose-Capillary: 103 mg/dL — ABNORMAL HIGH (ref 70–99)
Glucose-Capillary: 95 mg/dL (ref 70–99)
Glucose-Capillary: 99 mg/dL (ref 70–99)
Glucose-Capillary: 100 mg/dL — ABNORMAL HIGH (ref 70–99)
Glucose-Capillary: 106 mg/dL — ABNORMAL HIGH (ref 70–99)
Glucose-Capillary: 99 mg/dL (ref 70–99)
Glucose-Capillary: 112 mg/dL — ABNORMAL HIGH (ref 70–99)
Glucose-Capillary: 110 mg/dL — ABNORMAL HIGH (ref 70–99)
Glucose-Capillary: 125 mg/dL — ABNORMAL HIGH (ref 70–99)
Glucose-Capillary: 99 mg/dL (ref 70–99)
Glucose-Capillary: 105 mg/dL — ABNORMAL HIGH (ref 70–99)
Glucose-Capillary: 109 mg/dL — ABNORMAL HIGH (ref 70–99)
Glucose-Capillary: 98 mg/dL (ref 70–99)
Glucose-Capillary: 89 mg/dL (ref 70–99)
Glucose-Capillary: 98 mg/dL (ref 70–99)

## 2019-10-26 LAB — BASIC METABOLIC PANEL
Anion gap: 6 (ref 5–15)
BUN: 6 mg/dL (ref 6–20)
CO2: 26 mmol/L (ref 22–32)
Calcium: 7.4 mg/dL — ABNORMAL LOW (ref 8.9–10.3)
Chloride: 107 mmol/L (ref 98–111)
Creatinine, Ser: 0.86 mg/dL (ref 0.61–1.24)
GFR calc Af Amer: >60 mL/min (ref >60)
GFR calc non Af Amer: >60 mL/min (ref >60)
Glucose, Bld: 122 mg/dL — ABNORMAL HIGH (ref 70–99)
Potassium: 3.8 mmol/L (ref 3.5–5.1)
Sodium: 139 mmol/L (ref 135–145)

## 2019-10-26 LAB — LACTIC ACID, PLASMA: Lactic Acid, Venous: 2.5 mmol/L (ref 0.5–1.9)

## 2019-10-26 LAB — HEPATIC FUNCTION PANEL
ALT: 90 U/L — ABNORMAL HIGH (ref 0–44)
AST: 274 U/L — ABNORMAL HIGH (ref 15–41)
Albumin: 2.7 g/dL — ABNORMAL LOW (ref 3.5–5.0)
Alkaline Phosphatase: 35 U/L — ABNORMAL LOW (ref 38–126)
Bilirubin, Direct: 0.4 mg/dL — ABNORMAL HIGH (ref 0.0–0.2)
Indirect Bilirubin: 0.7 mg/dL (ref 0.3–0.9)
Total Bilirubin: 1.1 mg/dL (ref 0.3–1.2)
Total Protein: 4.6 g/dL — ABNORMAL LOW (ref 6.5–8.1)

## 2019-10-26 LAB — PREPARE FRESH FROZEN PLASMA
Unit division: 0
Unit division: 0
Unit division: 0

## 2019-10-26 LAB — PREPARE PLATELET PHERESIS
Unit division: 0
Unit division: 0

## 2019-10-26 LAB — BPAM FFP
Blood Product Expiration Date: 202106062359
Blood Product Expiration Date: 202106062359
Blood Product Expiration Date: 202106042359
Blood Product Expiration Date: 202106042359
ISSUE DATE / TIME: 202106021420
ISSUE DATE / TIME: 202106021420
ISSUE DATE / TIME: 202106021627
ISSUE DATE / TIME: 202106021627
Unit Type and Rh: 6200
Unit Type and Rh: 6200
Unit Type and Rh: 8400
Unit Type and Rh: 8400

## 2019-10-26 LAB — PROTIME-INR
INR: 1.2 (ref 0.8–1.2)
Prothrombin Time: 14.7 seconds (ref 11.4–15.2)

## 2019-10-26 LAB — HEPARIN LEVEL (UNFRACTIONATED)
Heparin Unfractionated: <0.1 IU/mL — ABNORMAL LOW (ref 0.30–0.70)
Heparin Unfractionated: <0.1 IU/mL — ABNORMAL LOW (ref 0.30–0.70)
Heparin Unfractionated: <0.1 IU/mL — ABNORMAL LOW (ref 0.30–0.70)
Heparin Unfractionated: <0.1 IU/mL — ABNORMAL LOW (ref 0.30–0.70)
Heparin Unfractionated: 0.15 IU/mL — ABNORMAL LOW (ref 0.30–0.70)

## 2019-10-26 LAB — ECHO INTRAOPERATIVE TEE
Height: 68.5 in
Weight: 2960 oz

## 2019-10-26 LAB — PREPARE RBC (CROSSMATCH)

## 2019-10-26 LAB — FIBRINOGEN: Fibrinogen: 411 mg/dL (ref 210–475)

## 2019-10-26 LAB — MAGNESIUM
Magnesium: 2.2 mg/dL (ref 1.7–2.4)
Magnesium: 2.1 mg/dL (ref 1.7–2.4)

## 2019-10-26 LAB — COOXEMETRY PANEL
Carboxyhemoglobin: 1.3 % (ref 0.5–1.5)
Methemoglobin: 1 % (ref 0.0–1.5)
O2 Saturation: 71.1 %
Total hemoglobin: 11.2 g/dL — ABNORMAL LOW (ref 12.0–16.0)

## 2019-10-26 LAB — APTT
aPTT: 31 seconds (ref 24–36)
aPTT: 52 seconds — ABNORMAL HIGH (ref 24–36)

## 2019-10-26 LAB — HEMOGLOBIN AND HEMATOCRIT, BLOOD
HCT: 26.1 % — ABNORMAL LOW (ref 39.0–52.0)
Hemoglobin: 8.7 g/dL — ABNORMAL LOW (ref 13.0–17.0)

## 2019-10-26 LAB — LACTATE DEHYDROGENASE: LDH: 662 U/L — ABNORMAL HIGH (ref 98–192)

## 2019-10-26 LAB — PHOSPHORUS: Phosphorus: 2.4 mg/dL — ABNORMAL LOW (ref 2.5–4.6)

## 2019-10-26 MED ORDER — SODIUM CHLORIDE 0.9% IV SOLUTION: Freq: Once | INTRAVENOUS | Status: DC

## 2019-10-26 MED ORDER — THIAMINE HCL 100 MG/ML IJ SOLN
500.0000 mg | Freq: Every day | INTRAVENOUS | Status: DC
  Administered 2019-10-26 – 2019-10-29 (×4): 500 mg via INTRAVENOUS
  Filled 2019-10-26 (×6): qty 5

## 2019-10-26 MED ORDER — ASPIRIN 81 MG PO CHEW
81.0000 mg | CHEWABLE_TABLET | Freq: Every day | ORAL | Status: DC
  Administered 2019-10-26 – 2019-11-11 (×15): 81 mg
  Filled 2019-10-26 (×15): qty 1

## 2019-10-26 MED ORDER — DOCUSATE SODIUM 50 MG/5ML PO LIQD
200.0000 mg | Freq: Every day | ORAL | Status: DC
  Administered 2019-10-26 – 2019-11-02 (×7): 200 mg
  Filled 2019-10-26 (×8): qty 20

## 2019-10-26 MED ORDER — METOCLOPRAMIDE HCL 5 MG/ML IJ SOLN
10.0000 mg | Freq: Three times a day (TID) | INTRAMUSCULAR | Status: DC
  Administered 2019-10-26 – 2019-11-05 (×29): 10 mg via INTRAVENOUS
  Filled 2019-10-26 (×28): qty 2

## 2019-10-26 MED ORDER — ALBUMIN HUMAN 5 % IV SOLN
12.5000 g | Freq: Once | INTRAVENOUS | Status: AC
  Administered 2019-10-26: 12.5 g via INTRAVENOUS

## 2019-10-26 MED ORDER — ALBUMIN HUMAN 5 % IV SOLN: 250.0000 mL | INTRAVENOUS | Status: AC | PRN

## 2019-10-26 MED ORDER — ORAL CARE MOUTH RINSE
15.0000 mL | OROMUCOSAL | Status: DC
  Administered 2019-10-26 – 2019-11-03 (×79): 15 mL via OROMUCOSAL

## 2019-10-26 MED ORDER — CLOPIDOGREL BISULFATE 75 MG PO TABS
75.0000 mg | ORAL_TABLET | Freq: Every day | ORAL | Status: DC
  Administered 2019-10-26 – 2019-10-29 (×4): 75 mg
  Filled 2019-10-26 (×4): qty 1

## 2019-10-26 MED ORDER — PIVOT 1.5 CAL PO LIQD
1000.0000 mL | ORAL | Status: DC
  Administered 2019-10-26 – 2019-10-29 (×3): 1000 mL
  Filled 2019-10-26 (×5): qty 1000

## 2019-10-26 MED ORDER — CHLORHEXIDINE GLUCONATE 0.12% ORAL RINSE (MEDLINE KIT)
15.0000 mL | Freq: Two times a day (BID) | OROMUCOSAL | Status: DC
  Administered 2019-10-26 – 2019-11-07 (×22): 15 mL via OROMUCOSAL

## 2019-10-26 MED ORDER — CHLORHEXIDINE GLUCONATE CLOTH 2 % EX PADS
6.0000 | MEDICATED_PAD | Freq: Every day | CUTANEOUS | Status: DC
  Administered 2019-10-26 – 2019-11-10 (×15): 6 via TOPICAL

## 2019-10-26 MED ORDER — LIDOCAINE HCL (PF) 1 % IJ SOLN
INTRAMUSCULAR | Status: AC
  Administered 2019-10-26: 5 mL
  Filled 2019-10-26: qty 5

## 2019-10-26 MED ORDER — PHENOBARBITAL SODIUM 130 MG/ML IJ SOLN
65.0000 mg | Freq: Every day | INTRAMUSCULAR | Status: DC
  Administered 2019-10-26: 65 mg via INTRAVENOUS
  Filled 2019-10-26: qty 1

## 2019-10-26 NOTE — Op Note (Signed)
NAME: William Crawford, William Crawford MEDICAL RECORD [id number] ACCOUNT [id number] DATE OF BIRTH:22-May-1971 FACILITY: [facility] LOCATION: [facility] PHYSICIAN:Lestat Golob VAN TRIGT III, MD  OPERATIVE REPORT  DATE OF PROCEDURE:  10/25/2019  OPERATION: 1.  Emergency sternotomy and placement on cardiopulmonary bypass  2.  Drainage of pericardial tamponade. 3.  Repair of perforation of the left anterior descending from percutaneous coronary intervention catheter. 4.  Cannulation for venoarterial extracorporeal membrane oxygenation. 5.  Placement of wound VAC.  SURGEON:  [name], MD  ASSISTANT:  Murvin Natal, MD   PREOPERATIVE DIAGNOSES:   1.  Perforation of left anterior descending during percutaneous coronary intervention resulting in cardiac tamponade, cardiac arrest. 2.  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  POSTOPERATIVE DIAGNOSES:   1.  Perforation of left anterior descending during percutaneous coronary intervention resulting in cardiac tamponade, cardiac arrest. 2.  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  ANESTHESIA:  General.  DESCRIPTION OF PROCEDURE:  I was called to the cath lab to evaluate the patient for emergency cardiac surgery for intrapericardial bleeding from an LAD perforation during PCI.  This had been initially partially controlled with a coronary stent, followed  by an inflated balloon in the coronary vessel.  The patient had been evaluated and brought to the operating room urgently after informed consent was obtained from the patient and his mother.  In the process of transferring the patient to the operating  room table, he had sudden cardiac arrest, PEA arrest.  He was given immediate CPR and  resuscitative measures, intubated and placed on the OR table.  He was quickly prepped and draped and a sternal incision was made by Dr. Orvan Seen.  I arrived to assist  with the pericardial opening to  drain the effusion and placement of aortic and right atrial cannulas for placement on cardiopulmonary bypass.  Once the patient was placed on cardiopulmonary bypass, his hemodynamics stabilized.  The heart was examined.  There was a wire with balloon which was deflated through the LAD in its midsection.  The hematoma surrounded the LAD and did not involve the  diagonal branch.  I did not feel that the LAD distally was an adequate target for grafting due to its small size and the surrounding hematoma.  I proceeded to repair the LAD with interrupted pledgeted 5-0 Prolene sutures to control the bleeding.  The patient was then supported on cardiopulmonary bypass.  The heart was over distended and an LV vent was placed.  With the heart decompressed and adequate perfusion pressures, the heart was allowed to recover from the cardiac arrest, which involved  approximately 15 minutes of CPR.  I placed epicardial pacing wires and the anesthesia team prepared the patient for separation from cardiopulmonary bypass.  The patient was started on inotropic support and was weaned from cardiopulmonary bypass.  However, he had biventricular dysfunction with low blood pressure and was immediately placed back on bypass.  We then set up the ECMO circuit using the [employer]help.   The ECMO lines were primed and placed on the sterile field.  We then came off cardiopulmonary bypass totally with volume left into the patient and the lines to the venous and arterial cannulas were clamped, divided and connected to the ECMO circuit and  the ECMO circuit was immediately started with good supportive pressure and oxygenation.  At this point, the LV vent was removed as adding it to the ECMO circuit did not appear to be beneficial.  The patient was then observed  on ECMO.  The heparin was partially reversed with protamine and blood products were provided and administered by anesthesia to help with coagulation and coagulopathy.  Chest tubes  were placed in the mediastinum and left  pleural space.  An Esmarch dressing sheath was placed on the edges of the wound and secured to the skin with running Prolene suture.  Over the Esmarch dressing, an abdominal VAC was applied with sterile sheets.  The patient then was transported back to  the ICU on ECMO support.  Total cardiopulmonary bypass time was 110 minutes.  VN/NUANCE  D:10/26/2019 T:10/26/2019 JOB:[id number]

## 2019-10-26 NOTE — Addendum Note (Signed)
Addendum  created 10/26/19 5027 by Effie Berkshire, MD   Intraprocedure Event edited

## 2019-10-26 NOTE — Progress Notes (Signed)
Initial Nutrition Assessment  DOCUMENTATION CODES:   Not applicable  INTERVENTION:   Tube Feeding via OG tube:  Pivot 1.5 at 65 ml/hr Begin TF at rate of 20 ml/hr; titrate by 10 mL q 8 hours until goal rate of 65 ml/hr Provides 146 g of protein, 2340 kcals, 1186 mL of free water   NUTRITION DIAGNOSIS:   Inadequate oral intake related to acute illness as evidenced by NPO status.  GOAL:   Patient will meet greater than or equal to 90% of their needs  MONITOR:   Vent status, TF tolerance, Labs, Weight trends  REASON FOR ASSESSMENT:   Consult, Ventilator Enteral/tube feeding initiation and management  ASSESSMENT:   49 yo male admitted to ICU on 6/2 following CTO PCI to LAD and developed perforated LAD followed by Vfib arrest requiring emergent repair and VA ECMO cannulation, transferred to ICU on vent support. PMH includes CAD, EtOH abuse, HTN, ICM, CHF  6/2 LAD perforation during DES placement followed by Vfib arrest, requiring emergent repair and VA ECMO cannulation  Patient is currently intubated on ventilator support. Pt requiring multiple pressors, attempting to wean. Sedated with precedex and fentanyl, on hydromorphone as well MV: 4.1 L/min Temp (24hrs), Avg:97.6 F (36.4 C), Min:97 F (36.1 C), Max:97.7 F (36.5 C)  Propofol: none  OG tube enters stomach per chest xray  Unable to obtain diet and weight history from patient at this time. Current wt 83.9 kg, net positive per I/O but no weight today. No weight loss noted per weight encounters  Labs: reviewed Drips: Insulin gtt, hydromorphone gtt, levophed gtt, precedex gtt, mirlirnone gtt, epinephrine gtt, vasopresin gtt Meds: folic acid, thiamine  NUTRITION - FOCUSED PHYSICAL EXAM:    Most Recent Value  Orbital Region  No depletion  Upper Arm Region  No depletion  Thoracic and Lumbar Region  Unable to assess  Buccal Region  Unable to assess  Temple Region  No depletion  Clavicle Bone Region  No depletion   Clavicle and Acromion Bone Region  No depletion  Scapular Bone Region  Unable to assess  Dorsal Hand  No depletion  Patellar Region  No depletion  Anterior Thigh Region  No depletion  Posterior Calf Region  No depletion       Diet Order:   Diet Order            Diet NPO time specified  Diet effective now              EDUCATION NEEDS:   Not appropriate for education at this time  Skin:  Skin Assessment: Skin Integrity Issues: Skin Integrity Issues:: Wound VAC Wound Vac: chest incision  Last BM:  PTA  Height:   Ht Readings from Last 1 Encounters:  10/25/19 5' 8.5" (1.74 m)    Weight:   Wt Readings from Last 1 Encounters:  10/25/19 83.9 kg   BMI:  Body mass index is 27.72 kg/m.  Estimated Nutritional Needs:   Kcal:  2100-2500 kcals  Protein:  126-151 g  Fluid:  >/= 2 L   Kerman Passey MS, RDN, LDN, CNSC RD Pager Number and RD On-Call Pager Number Located in [place]

## 2019-10-26 NOTE — Progress Notes (Signed)
OT Cancellation Note  Patient Details Name: CAMBELL STANEK MRN: [id number] DOB: 21-Nov-1970   Cancelled Treatment:    Reason Eval/Treat Not Completed: Patient not medically ready(Pt recently started on ECMO, intubated and sedated remaining tenuous. OT will sign off and await re order as pt is more medically able to advance with therapies.)   Zenovia Jarred, MSOT, OTR/L [place] [facility] Office Number: [phone] Pager: [phone]  Zenovia Jarred 10/26/2019, 2:21 PM

## 2019-10-26 NOTE — Progress Notes (Signed)
PT Cancellation Note  Patient Details Name: William Crawford MRN: [id number] DOB: 1970-12-02   Cancelled Treatment:    Reason Eval/Treat Not Completed: Patient not medically ready. Spoke with ECMO specialist, pt intubated/sedated. Request reorder when pt medically appropriate to participate with PT evaluation.  Mabeline Caras, PT, DPT Acute Rehabilitation Services  Pager [phone] Office [place] 10/26/2019, 8:22 AM

## 2019-10-26 NOTE — Plan of Care (Signed)
  Problem: Cardiovascular: Goal: Vascular access site(s) Level 0-1 will be maintained Outcome: Progressing   Problem: Cardiac: Goal: Will achieve and/or maintain hemodynamic stability Outcome: Not Progressing  Weaning TR Band Problem: Respiratory: Goal: Respiratory status will improve Outcome: Not Progressing   Problem: Clinical Measurements: Goal: Diagnostic test results will improve Outcome: Progressing   Problem: Pain Managment: Goal: General experience of comfort will improve Outcome: Progressing  Pt on dilaudid,versed and precedex gtt Problem: Pain Managment: Goal: General experience of comfort will improve Outcome: Progressing

## 2019-10-26 NOTE — Progress Notes (Addendum)
Patient ID: William Crawford, male   DOB: 1970/08/15, 49 y.o.   MRN: [id number]     Advanced Heart Failure Rounding Note  PCP-Cardiologist: No primary care provider on file.   Subjective:    - LAD repair + VA ECMO cannulation in OR 6/3.   Patient did well overnight.  Lactate trending down, co-ox 70%.  This morning, he is on epinephrine 1, lidocaine 1, milrinone 0.375, vasopressin 0.02, NE 3.  He is on heparin gtt but heparin level low. Flow is pulsatile on a-line.   LDH 662 ALT 90 AST 274 hgb 8.2 Creatinine 0.99  ECMO circuit functioning normally, no chugging with good flow.   7.37/31/429  Swan: CVP 12 PA 21/17 Co-ox 70%  ECMO circuit Flow 4.5 L/min  3300 rpm -39 DeltaP 18 Sweep 4  Objective:   Weight Range: 83.9 kg Body mass index is 27.72 kg/m.   Vital Signs:   Temp:  [97 F (36.1 C)-97.7 F (36.5 C)] 97.7 F (36.5 C) (06/03 0700) Pulse Rate:  [50-110] 89 (06/03 0700) Resp:  [0-51] 11 (06/03 0700) BP: (72-176)/(45-115) 108/77 (06/02 1840) SpO2:  [0 %-100 %] 100 % (06/03 0700) FiO2 (%):  [30 %-50 %] 30 % (06/03 0400)    Weight change: Filed Weights   10/25/19 0650  Weight: 83.9 kg    Intake/Output:   Intake/Output Summary (Last 24 hours) at 10/26/2019 0808 Last data filed at 10/26/2019 0700 Gross per 24 hour  Intake 9594.29 ml  Output 5970 ml  Net 3624.29 ml      Physical Exam    General:  Intubated/sedated HEENT: Normal Neck: Supple. JVP 10. Carotids 2+ bilat; no bruits. No lymphadenopathy or thyromegaly appreciated. Cor: PMI nondisplaced. Regular rate & rhythm. No rubs, gallops or murmurs. ECMO sounds Lungs: Decreased at bases Abdomen: Soft, nontender, nondistended. No hepatosplenomegaly. No bruits or masses. Good bowel sounds. Extremities: Sedated on vent   Telemetry   A-paced in 70s (personally reviewed)  Labs    CBC Recent Labs    10/26/19 0053 10/26/19 0053 10/26/19 0305 10/26/19 0314  WBC 6.2  --  5.8  --   HGB 8.6*   <  > 8.2* 7.5*  HCT 26.2*   < > 24.4* 22.0*  MCV 88.5  --  87.5  --   PLT 234  --  197  --    < > = values in this interval not displayed.   Basic Metabolic Panel Recent Labs    10/25/19 2027 10/25/19 2027 10/26/19 0050 10/26/19 0305 10/26/19 0314  NA 142   < > 142  --  142  K 4.3   < > 5.1  --  4.4  CL 110  --   --   --   --   CO2 25  --   --   --   --   GLUCOSE 136*  --   --   --   --   BUN 9  --   --   --   --   CREATININE 0.99  --   --   --   --   CALCIUM 7.3*  --   --   --   --   MG  --   --   --  2.2  --    < > = values in this interval not displayed.   Liver Function Tests Recent Labs    10/25/19 2027 10/26/19 0305  AST 222* 274*  ALT 87* 90*  ALKPHOS 38  35*  BILITOT 1.3* 1.1  PROT 4.6* 4.6*  ALBUMIN 2.7* 2.7*   No results for input(s): LIPASE, AMYLASE in the last 72 hours. Cardiac Enzymes No results for input(s): CKTOTAL, CKMB, CKMBINDEX, TROPONINI in the last 72 hours.  BNP: BNP (last 3 results) Recent Labs    10/17/19 0605  BNP 608.8*    ProBNP (last 3 results) No results for input(s): PROBNP in the last 8760 hours.   D-Dimer No results for input(s): DDIMER in the last 72 hours. Hemoglobin A1C No results for input(s): HGBA1C in the last 72 hours. Fasting Lipid Panel No results for input(s): CHOL, HDL, LDLCALC, TRIG, CHOLHDL, LDLDIRECT in the last 72 hours. Thyroid Function Tests No results for input(s): TSH, T4TOTAL, T3FREE, THYROIDAB in the last 72 hours.  Invalid input(s): FREET3  Other results:   Imaging    CARDIAC CATHETERIZATION  Result Date: 10/25/2019  Mid LAD lesion is 100% stenosed.  1st Mrg lesion is 100% stenosed.  Mid Cx to Dist Cx lesion is 50% stenosed.  Prox RCA to Mid RCA lesion is 35% stenosed.  A covered stent placed using a STENT PK PAPYRUS 2.5X20. and a 2.5 x 15 mm PK PAPYRUS with unsuccessful sealing of the perforation.  1. Unsuccessful CTO PCI of the LAD. Able to cross the lesion with a wire. With initial balloon  inflation with a 2.0 mm balloon the patient developed a large perforation. We were unable to seal the perforation with prolonged balloon inflations and placement of Covered stents x 2. Balloon left inflated in the vessel to tamponade the perforation until definitive surgery done. Plan emergent surgery for oversewing of the perforation.   DG Chest Port 1 View  Result Date: 10/25/2019 CLINICAL DATA:  MRI EXAM: PORTABLE CHEST 1 VIEW COMPARISON:  10/25/2019 FINDINGS: NG tube has been straightened and advanced into the stomach. Otherwise support devices are stable. Cardiomegaly. No confluent opacities, effusions or pneumothorax. IMPRESSION: Interval repositioning of NG tube into the stomach. Otherwise no change. Electronically Signed   By: Rolm Baptise M.D.   On: 10/25/2019 19:41   DG Chest Portable 1 View  Result Date: 10/25/2019 CLINICAL DATA:  49 year old male status post open heart surgery. EXAM: PORTABLE CHEST 1 VIEW COMPARISON:  Earlier radiograph dated 10/25/2019 FINDINGS: The right IJ Swan-Ganz has been advanced with tip now positioned in the right main pulmonary artery. Enteric tube remains folded in the distal esophagus. Recommend retraction and repositioning. No other interval change. IMPRESSION: 1. Interval advancement of the right IJ Swan-Ganz with tip in the right main pulmonary artery. 2. Enteric tube remains folded in the distal esophagus. Electronically Signed   By: Anner Crete M.D.   On: 10/25/2019 18:13   DG Chest Portable 1 View  Result Date: 10/25/2019 CLINICAL DATA:  49 year old male with open heart surgery EXAM: PORTABLE CHEST 1 VIEW COMPARISON:  Chest radiograph dated 10/17/2019 and follow-up radiograph dated 10/25/2019. FINDINGS: Postsurgical changes of median sternotomy. Evaluation of the tube is limited due to superimposition of the support apparatus. An endotracheal tube is noted. The tip of the endotracheal tube is not identified. An enteric tube is seen extending into the  distal esophagus and looping back with side-port over the T1 and tip beyond the upper margin of the image. Recommend removal and repositioning. The right IJ Swan-Ganz catheter with tip likely in the right ventricle. This has been advanced on the follow-up radiograph. There is a left-sided chest tube. Diffuse bilateral interstitial and peribronchial densities. No focal consolidation, large pleural effusion,  or pneumothorax. Mild cardiomegaly. IMPRESSION: 1. Open heart surgery. 2. Right IJ Swan-Ganz catheter with tip in the right ventricle. This has been advanced on the follow-up image. 3. Enteric tube folded in the distal esophagus. Recommend retraction and repositioning. Electronically Signed   By: Anner Crete M.D.   On: 10/25/2019 18:11   ECHOCARDIOGRAM LIMITED  Result Date: 10/25/2019    ECHOCARDIOGRAM REPORT   Patient Name:   LEMONTE AL Date of Exam: 10/25/2019 Medical Rec #:  [id number]         Height:       68.5 in Accession #:    [id number]        Weight:       185.0 lb Date of Birth:  Mar 20, 1971         BSA:          1.988 m Patient Age:    46 years          BP:           119/92 mmHg Patient Gender: M                 HR:           85 bpm. Exam Location:  Inpatient Procedure: Limited Echo and Echo Assisted Procedure STAT ECHO Indications:    Pericardial Effusion  History:        Patient has prior history of Echocardiogram examinations, most                 recent 10/17/2019. Risk Factors:Current Smoker and Alcohol Abuse.  Sonographer:    Mikki Santee RDCS (AE) Referring Phys: [place]  1. Left ventricular ejection fraction, by estimation, is 45%. The left ventricle has mildly decreased function. The left ventricle demonstrates regional wall motion abnormalities, there is hypokinesis of the apical septum, the apical anterior wall, and the true apex. There is mild left ventricular hypertrophy. Left ventricular diastolic parameters are indeterminate.  2. Right  ventricular systolic function is normal. The right ventricular size is normal.  3. The mitral valve is normal in structure. No doppler mitral valve regurgitation.  4. Tricuspid valve regurgitation No doppler.  5. The aortic valve was not well visualized. Aortic valve regurgitation is not visualized. No aortic stenosis is present.  6. Moderate pericardial effusion. The IVC is not dilated but there does appear to be a degree of RV diastolic collapse suggesting tamponade. FINDINGS  Left Ventricle: Left ventricular ejection fraction, by estimation, is 45%. The left ventricle has mildly decreased function. The left ventricle demonstrates regional wall motion abnormalities. The left ventricular internal cavity size was normal in size. There is mild left ventricular hypertrophy. Left ventricular diastolic parameters are indeterminate. Right Ventricle: The right ventricular size is normal. No increase in right ventricular wall thickness. Right ventricular systolic function is normal. Left Atrium: Left atrial size was normal in size. Right Atrium: Right atrial size was normal in size. Pericardium: Moderate pericardial effusion. The IVC is not dilated but there does appear to be a degree of RV diastolic collapse suggesting tamponade. A moderately sized pericardial effusion is present. Mitral Valve: The mitral valve is normal in structure. No doppler mitral valve regurgitation. Tricuspid Valve: The tricuspid valve is normal in structure. Tricuspid valve regurgitation No doppler. Aortic Valve: The aortic valve was not well visualized. Aortic valve regurgitation is not visualized. No aortic stenosis is present. Pulmonic Valve: The pulmonic valve was not well visualized. Pulmonic valve regurgitation is not visualized. Aorta:  The aortic root is normal in size and structure. IAS/Shunts: No atrial level shunt detected by color flow Doppler. Loralie Champagne MD Electronically signed by Loralie Champagne MD Signature Date/Time:  10/25/2019/5:41:19 PM    Final       Medications:     Scheduled Medications:  sodium chloride   Intravenous Once   sodium chloride   Intravenous Once   sodium chloride   Intravenous Once   acetaminophen  1,000 mg Oral Q6H   Or   acetaminophen (TYLENOL) oral liquid 160 mg/5 mL  1,000 mg Per Tube Q6H   acetaminophen (TYLENOL) oral liquid 160 mg/5 mL  650 mg Per Tube Once   Or   acetaminophen  650 mg Rectal Once   aspirin  81 mg Per Tube Daily   bisacodyl  10 mg Oral Daily   Or   bisacodyl  10 mg Rectal Daily   chlorhexidine gluconate (MEDLINE KIT)  15 mL Mouth Rinse BID   Chlorhexidine Gluconate Cloth  6 each Topical Daily   clopidogrel  75 mg Per Tube Daily   docusate sodium  200 mg Oral Daily   folic acid  1 mg Intravenous Daily   mouth rinse  15 mL Mouth Rinse 10 times per day   metoprolol tartrate  12.5 mg Oral BID   Or   metoprolol tartrate  12.5 mg Per Tube BID   pantoprazole (PROTONIX) IV  40 mg Intravenous QHS   sodium chloride flush  3 mL Intravenous Q12H   thiamine injection  1,000 mg Intravenous Daily     Infusions:  sodium chloride     sodium chloride 10 mL/hr at 10/26/19 0700   sodium chloride     sodium chloride     sodium chloride 75 mL/hr at 10/26/19 0700   albumin human     ceFEPime (MAXIPIME) IV Stopped (10/26/19 0536)   dexmedetomidine (PRECEDEX) IV infusion 0.7 mcg/kg/hr (10/26/19 0700)   dextrose 5 % and 0.45% NaCl     epinephrine 1 mcg/min (10/26/19 0700)   famotidine (PEPCID) IV Stopped (10/25/19 2252)   heparin 500 Units/hr (10/26/19 0700)   HYDROmorphone 2 mg/hr (10/26/19 0700)   insulin 0.5 mL/hr at 10/26/19 0700   lactated ringers     lactated ringers 10 mL/hr at 10/26/19 0700   lactated ringers     lidocaine 1 mg/min (10/26/19 0700)   midazolam 2 mg/hr (10/26/19 0700)   milrinone 0.375 mcg/kg/min (10/26/19 0700)   nitroGLYCERIN     norepinephrine (LEVOPHED) Adult infusion 3 mcg/min (10/26/19  0700)   phenylephrine (NEO-SYNEPHRINE) Adult infusion     vancomycin     vasopressin (PITRESSIN) infusion - *FOR SHOCK* 0.02 Units/min (10/26/19 0700)     PRN Medications:  sodium chloride, albumin human, dextrose, HYDROmorphone, lactated ringers, metoprolol tartrate, midazolam, morphine injection, ondansetron (ZOFRAN) IV, ondansetron (ZOFRAN) IV, oxyCODONE, sodium chloride flush, traMADol   Assessment/Plan   1. Cardiogenic shock: Pre-OR echo with EF 45%, apical hypokinesis.  TEE in OR after VF arrest showed severe biventricular dysfunction. Now on ECMO. Currently stable parameters with good flow, pulsatile on arterial line.  Lactate coming down, good co-ox.  He remains on norepinephrine 3, epinephrine 1, vasopressin 0.02, milrinone 0.375. MAP stable, CVP 12. Creatinine stable. Will plan to rest on ECMO for now, allow LV/RV time to recover from VF arrest/stunning.  - Heparin to goal heparin level 0.3-0.5  - Wean pressors.  - Echo today.  2. Pericardial tamponade: Due to LAD perforation.  Now resolved in OR.  3. CAD: S/p DES to distal RCA on 5/25.  Has known CTO OM.  Had attempted intervention on CTO LAD today that was complicated by LAD perforation, now s/p LAD repair.  The LAD was not graftable, LAD territory therefore is not perfused except by pre-existing collaterals.  - Continue ASA 81.  - Restart Plavix 75 mg daily.  - Restart statin if LFTs do not trend up.  4. ETOH abuse: Watch for withdrawal, currently on Precedex gtt and will get dose of phenobarbital per Dr. Lynetta Mare.  5. Smoking: Will need to quit.  6. Acute hypoxemic respiratory failure: In setting of cardiac surgery/ECMO.  Vent with FiO2 0.3, sweep 4. Stable ABG.   - Per CCM.  - Vancomycin/cefepime started ECMO prophylaxis.  7. Neuro: Moves extremities.  8. Nutrition: Start tube feeds.  9. VF arrest: In OR.  No further arrhythmias.  - Stop lidocaine.   CRITICAL CARE Performed by: Loralie Champagne  Total critical care  time: 40 minutes  Critical care time was exclusive of separately billable procedures and treating other patients.  Critical care was necessary to treat or prevent imminent or life-threatening deterioration.  Critical care was time spent personally by me on the following activities: development of treatment plan with patient and/or surrogate as well as nursing, discussions with consultants, evaluation of patient's response to treatment, examination of patient, obtaining history from patient or surrogate, ordering and performing treatments and interventions, ordering and review of laboratory studies, ordering and review of radiographic studies, pulse oximetry and re-evaluation of patient's condition.   Length of Stay: 1  Loralie Champagne, MD  10/26/2019, 8:08 AM  Advanced Heart Failure Team Pager [phone] (M-F; 7a - 4p)  Please contact [facility] for night-coverage after hours (4p -7a ) and weekends on [email or web address]

## 2019-10-26 NOTE — Progress Notes (Signed)
  Echocardiogram 2D Echocardiogram has been performed.  Jennette Dubin 10/26/2019, 11:08 AM

## 2019-10-26 NOTE — Progress Notes (Signed)
ANTICOAGULATION CONSULT NOTE   Pharmacy Consult for IV Heparin Indication: ECMO  No Known Allergies  Patient Measurements: Height: 5' 8.5" (174 cm) Weight: 83.9 kg (185 lb) IBW/kg (Calculated) : 69.55 Heparin Dosing Weight: 83.9 kg  Vital Signs: Temp: 97.3 F (36.3 C) (06/03 1445) Temp Source: Core (06/03 0915) Pulse Rate: 88 (06/03 1445)  Labs: Recent Labs     0000 10/25/19 1859 10/25/19 2027 10/26/19 0050 10/26/19 0053 10/26/19 0053 10/26/19 0305 10/26/19 0305 10/26/19 0314 10/26/19 0314 10/26/19 0907 10/26/19 1011 10/26/19 1015 10/26/19 1400  HGB  --  7.8*  --    < > 8.6*   < > 8.2*   < > 7.5*   < >  --  7.8* 8.7*  --   HCT  --  23.5*  --    < > 26.2*   < > 24.4*   < > 22.0*  --   --  23.0* 26.1*  --   PLT  --  216  --   --  234  --  197  --   --   --   --   --   --   --   APTT  --  47*  --   --  31  --   --   --   --   --   --   --   --   --   LABPROT  --  15.1  --   --   --   --  14.7  --   --   --   --   --   --   --   INR  --  1.2  --   --   --   --  1.2  --   --   --   --   --   --   --   HEPARINUNFRC   < >  --  0.10*  --  <0.10*   < > <0.10*  --   --   --  <0.10*  --   --  <0.10*  CREATININE  --   --  0.99  --   --   --   --   --   --   --  0.90  --   --   --    < > = values in this interval not displayed.    Estimated Creatinine Clearance: 108.1 mL/min (by C-G formula based on SCr of 0.9 mg/dL).   Medical History: Past Medical History:  Diagnosis Date  . Alcohol abuse   . GERD (gastroesophageal reflux disease)   . Tobacco abuse     Assessment: 49 year old male initiated on [place] ECMO.  Received Heparin bolus of 95[address] units in OR at time of cannulation Hemoglobin low but improved 7>8.7. Platelets 200.  S/p oozing from iv site > stitched and improved  Previously discussed with Dr. Prescott Gum - Heparin systemically  at 500 units/hr without titration overnight. This am during rounds instructed to increase heparin drip to goal 0.2-0.4.    Heparin  drip increased 800 uts/hr HL remains < 0.1 With stable ECMO circuit per coordinators/rn staff  Goal of Therapy:  Heparin Level 0.2-0.4 Monitor platelets by anticoagulation protocol: Yes   Plan:  Increase  Heparin drip 1000 uts/hr  - titrate up very slowly - may want to touch base with surgeon if HL remains undeectable Will continue Heparin levels every 6 hours until therapeutic x2 then every 12 hours  thereafter.   Bonnita Nasuti Pharm.D. CPP, BCPS Clinical Pharmacist [phone] 10/26/2019 3:30 PM     Please check AMION for all [place] numbers  10/26/2019 3:19 PM

## 2019-10-26 NOTE — Progress Notes (Signed)
[facility] DAILY ICU PROGRESS NOTE                   [address]            [place] [address]          [phone]   1 Day Post-Op Procedure(s) (LRB): TRANSESOPHAGEAL ECHOCARDIOGRAM (TEE) (N/A) Median Sternotomy. Cannulation For Ecmo (Extracorporeal Membrane Oxygenation) @ 9147 Application Of Wound Vac Chest Exploration for repair of coronary perforation (repair of LAD)  Total Length of Stay:  LOS: 1 day   Subjective: Patient remains intubated, sedated as on ECMO (failure to wean from cardiopulmonary bypass) ECMO flows 4.2 L, lactic acid improved Received 2 U PRBC this am for Hb 7.5 Moves all extremities  Objective: Vital signs in last 24 hours: Temp:  [97 F (36.1 C)-97.7 F (36.5 C)] 97.7 F (36.5 C) (06/03 0700) Pulse Rate:  [50-110] 89 (06/03 0700) Cardiac Rhythm: A-V Sequential paced (06/02 1930) Resp:  [0-51] 11 (06/03 0700) BP: (72-176)/(45-115) 108/77 (06/02 1840) SpO2:  [0 %-100 %] 100 % (06/03 0700) FiO2 (%):  [30 %-50 %] 30 % (06/03 0400)  Filed Weights   10/25/19 0650  Weight: 83.9 kg    Weight change:    Hemodynamic parameters for last 24 hours: PAP: (16-30)/(12-22) 22/19 CVP:  [10 mmHg-21 mmHg] 13 mmHg  Intake/Output from previous day: 06/02 0701 - 06/03 0700 In: 9594.3 [I.V.:6036.5; Blood:2523; NG/GT:90; IV Piggyback:944.8] Out: 8295 [Urine:4500; Blood:1200; Chest Tube:270]  Intake/Output this shift: No intake/output data recorded.  Current Meds: Scheduled Meds: . sodium chloride   Intravenous Once  . sodium chloride   Intravenous Once  . sodium chloride   Intravenous Once  . acetaminophen  1,000 mg Oral Q6H   Or  . acetaminophen (TYLENOL) oral liquid 160 mg/5 mL  1,000 mg Per Tube Q6H  . acetaminophen (TYLENOL) oral liquid 160 mg/5 mL  650 mg Per Tube Once   Or  . acetaminophen  650 mg Rectal Once  . aspirin  81 mg Per Tube Daily  . bisacodyl  10 mg Oral Daily   Or  . bisacodyl  10 mg Rectal Daily  . chlorhexidine  gluconate (MEDLINE KIT)  15 mL Mouth Rinse BID  . Chlorhexidine Gluconate Cloth  6 each Topical Daily  . clopidogrel  75 mg Per Tube Daily  . docusate sodium  200 mg Oral Daily  . folic acid  1 mg Intravenous Daily  . mouth rinse  15 mL Mouth Rinse 10 times per day  . metoprolol tartrate  12.5 mg Oral BID   Or  . metoprolol tartrate  12.5 mg Per Tube BID  . pantoprazole (PROTONIX) IV  40 mg Intravenous QHS  . PHENObarbital  65 mg Intravenous QHS  . sodium chloride flush  3 mL Intravenous Q12H  . thiamine injection  1,000 mg Intravenous Daily   Continuous Infusions: . sodium chloride    . sodium chloride 10 mL/hr at 10/26/19 0700  . sodium chloride    . sodium chloride    . sodium chloride 75 mL/hr at 10/26/19 0700  . albumin human    . ceFEPime (MAXIPIME) IV Stopped (10/26/19 0536)  . dexmedetomidine (PRECEDEX) IV infusion 0.7 mcg/kg/hr (10/26/19 0700)  . dextrose 5 % and 0.45% NaCl    . epinephrine 1 mcg/min (10/26/19 0700)  . famotidine (PEPCID) IV Stopped (10/25/19 2252)  . heparin 800 Units/hr (10/26/19 6213)  . HYDROmorphone 2 mg/hr (10/26/19 0700)  . insulin 0.5 mL/hr  at 10/26/19 0700  . lactated ringers    . lactated ringers 10 mL/hr at 10/26/19 0700  . lactated ringers    . lidocaine 1 mg/min (10/26/19 0700)  . midazolam 2 mg/hr (10/26/19 0700)  . milrinone 0.375 mcg/kg/min (10/26/19 0700)  . nitroGLYCERIN    . norepinephrine (LEVOPHED) Adult infusion 3 mcg/min (10/26/19 0700)  . phenylephrine (NEO-SYNEPHRINE) Adult infusion    . vancomycin    . vasopressin (PITRESSIN) infusion - *FOR SHOCK* 0.02 Units/min (10/26/19 0700)   PRN Meds:.sodium chloride, albumin human, dextrose, HYDROmorphone, lactated ringers, metoprolol tartrate, midazolam, morphine injection, ondansetron (ZOFRAN) IV, ondansetron (ZOFRAN) IV, oxyCODONE, sodium chloride flush, traMADol       Exam    General- sedated on vent    Neck- no JVD, no cervical adenopathy palpable, no carotid bruit. Some  bleeding around Swan sleeve   Lungs- clear without rales, wheezes   Cor- regular rate and rhythm, no murmur , gallop   Abdomen- soft, non-tender   Extremities - warm, non-tender, minimal edema, perfusion improved   Neuro- pupils reactive   Lab Results: CBC: Recent Labs    10/26/19 0053 10/26/19 0053 10/26/19 0305 10/26/19 0314  WBC 6.2  --  5.8  --   HGB 8.6*   < > 8.2* 7.5*  HCT 26.2*   < > 24.4* 22.0*  PLT 234  --  197  --    < > = values in this interval not displayed.   BMET:  Recent Labs    10/25/19 2027 10/25/19 2027 10/26/19 0050 10/26/19 0314  NA 142   < > 142 142  K 4.3   < > 5.1 4.4  CL 110  --   --   --   CO2 25  --   --   --   GLUCOSE 136*  --   --   --   BUN 9  --   --   --   CREATININE 0.99  --   --   --   CALCIUM 7.3*  --   --   --    < > = values in this interval not displayed.    CMET: Lab Results  Component Value Date   WBC 5.8 10/26/2019   HGB 7.5 (L) 10/26/2019   HCT 22.0 (L) 10/26/2019   PLT 197 10/26/2019   GLUCOSE 136 (H) 10/25/2019   CHOL 193 10/18/2019   TRIG 171 (H) 10/18/2019   HDL 37 (L) 10/18/2019   LDLCALC 122 (H) 10/18/2019   ALT 90 (H) 10/26/2019   AST 274 (H) 10/26/2019   NA 142 10/26/2019   K 4.4 10/26/2019   CL 110 10/25/2019   CREATININE 0.99 10/25/2019   BUN 9 10/25/2019   CO2 25 10/25/2019   INR 1.2 10/26/2019   HGBA1C 5.2 10/18/2019      PT/INR:  Recent Labs    10/26/19 0305  LABPROT 14.7  INR 1.2   Radiology: [facility] Chest Port 1 View  Result Date: 10/25/2019 CLINICAL DATA:  MRI EXAM: PORTABLE CHEST 1 VIEW COMPARISON:  10/25/2019 FINDINGS: NG tube has been straightened and advanced into the stomach. Otherwise support devices are stable. Cardiomegaly. No confluent opacities, effusions or pneumothorax. IMPRESSION: Interval repositioning of NG tube into the stomach. Otherwise no change. Electronically Signed   By: Rolm Baptise M.D.   On: 10/25/2019 19:41   DG Chest Portable 1 View  Result Date:  10/25/2019 CLINICAL DATA:  49 year old male status post open heart surgery. EXAM: PORTABLE CHEST 1  VIEW COMPARISON:  Earlier radiograph dated 10/25/2019 FINDINGS: The right IJ Swan-Ganz has been advanced with tip now positioned in the right main pulmonary artery. Enteric tube remains folded in the distal esophagus. Recommend retraction and repositioning. No other interval change. IMPRESSION: 1. Interval advancement of the right IJ Swan-Ganz with tip in the right main pulmonary artery. 2. Enteric tube remains folded in the distal esophagus. Electronically Signed   By: Anner Crete M.D.   On: 10/25/2019 18:13   DG Chest Portable 1 View  Result Date: 10/25/2019 CLINICAL DATA:  49 year old male with open heart surgery EXAM: PORTABLE CHEST 1 VIEW COMPARISON:  Chest radiograph dated 10/17/2019 and follow-up radiograph dated 10/25/2019. FINDINGS: Postsurgical changes of median sternotomy. Evaluation of the tube is limited due to superimposition of the support apparatus. An endotracheal tube is noted. The tip of the endotracheal tube is not identified. An enteric tube is seen extending into the distal esophagus and looping back with side-port over the T1 and tip beyond the upper margin of the image. Recommend removal and repositioning. The right IJ Swan-Ganz catheter with tip likely in the right ventricle. This has been advanced on the follow-up radiograph. There is a left-sided chest tube. Diffuse bilateral interstitial and peribronchial densities. No focal consolidation, large pleural effusion, or pneumothorax. Mild cardiomegaly. IMPRESSION: 1. Open heart surgery. 2. Right IJ Swan-Ganz catheter with tip in the right ventricle. This has been advanced on the follow-up image. 3. Enteric tube folded in the distal esophagus. Recommend retraction and repositioning. Electronically Signed   By: Anner Crete M.D.   On: 10/25/2019 18:11   ECHOCARDIOGRAM LIMITED  Result Date: 10/25/2019    ECHOCARDIOGRAM REPORT    Patient Name:   William Crawford Date of Exam: 10/25/2019 Medical Rec #:  [id number]         Height:       68.5 in Accession #:    [id number]        Weight:       185.0 lb Date of Birth:  21-Sep-1970         BSA:          1.988 m Patient Age:    49 years          BP:           119/92 mmHg Patient Gender: M                 HR:           85 bpm. Exam Location:  Inpatient Procedure: Limited Echo and Echo Assisted Procedure STAT ECHO Indications:    Pericardial Effusion  History:        Patient has prior history of Echocardiogram examinations, most                 recent 10/17/2019. Risk Factors:Current Smoker and Alcohol Abuse.  Sonographer:    Mikki Santee RDCS (AE) Referring Phys: [place]  1. Left ventricular ejection fraction, by estimation, is 45%. The left ventricle has mildly decreased function. The left ventricle demonstrates regional wall motion abnormalities, there is hypokinesis of the apical septum, the apical anterior wall, and the true apex. There is mild left ventricular hypertrophy. Left ventricular diastolic parameters are indeterminate.  2. Right ventricular systolic function is normal. The right ventricular size is normal.  3. The mitral valve is normal in structure. No doppler mitral valve regurgitation.  4. Tricuspid valve regurgitation No doppler.  5. The aortic valve was not  well visualized. Aortic valve regurgitation is not visualized. No aortic stenosis is present.  6. Moderate pericardial effusion. The IVC is not dilated but there does appear to be a degree of RV diastolic collapse suggesting tamponade. FINDINGS  Left Ventricle: Left ventricular ejection fraction, by estimation, is 45%. The left ventricle has mildly decreased function. The left ventricle demonstrates regional wall motion abnormalities. The left ventricular internal cavity size was normal in size. There is mild left ventricular hypertrophy. Left ventricular diastolic parameters are indeterminate.  Right Ventricle: The right ventricular size is normal. No increase in right ventricular wall thickness. Right ventricular systolic function is normal. Left Atrium: Left atrial size was normal in size. Right Atrium: Right atrial size was normal in size. Pericardium: Moderate pericardial effusion. The IVC is not dilated but there does appear to be a degree of RV diastolic collapse suggesting tamponade. A moderately sized pericardial effusion is present. Mitral Valve: The mitral valve is normal in structure. No doppler mitral valve regurgitation. Tricuspid Valve: The tricuspid valve is normal in structure. Tricuspid valve regurgitation No doppler. Aortic Valve: The aortic valve was not well visualized. Aortic valve regurgitation is not visualized. No aortic stenosis is present. Pulmonic Valve: The pulmonic valve was not well visualized. Pulmonic valve regurgitation is not visualized. Aorta: The aortic root is normal in size and structure. IAS/Shunts: No atrial level shunt detected by color flow Doppler. Loralie Champagne MD Electronically signed by Loralie Champagne MD Signature Date/Time: 10/25/2019/5:41:19 PM    Final      Assessment/Plan: S/P Procedure(s) (LRB): TRANSESOPHAGEAL ECHOCARDIOGRAM (TEE) (N/A) Median Sternotomy. Cannulation For Ecmo (Extracorporeal Membrane Oxygenation) @ 9326 Application Of Wound Vac Chest Exploration for repair of coronary perforation (repair of LAD)   1. CV-S/p NSTEMI. On multiple drips (Epinephrine 1 mcg/min, Heparin drip, Milrinone 0.375 mcg/kg/min, Nor epinephrine 3 mcg/min, Vasopressin 0.02 units/min). 2. Pulmonary-Intubated/sedated. Chest tubes with 310 since surgery. CXR this am appears stable (no cardiomegaly, no pneumothorax). 3. Expected post op blood loss anemia-H and H this am 7.5 and 22. Transfusion already ordered. 4.Follow Hb, increase heparin, start tube feeds- patient will need 4-5 days of support     Donielle Liston Alba PA-C 10/26/2019 8:16 AM

## 2019-10-26 NOTE — Plan of Care (Signed)
  Problem: Cardiac: Goal: Will achieve and/or maintain hemodynamic stability Outcome: Not Progressing   Problem: Respiratory: Goal: Respiratory status will improve Outcome: Not Progressing  Pt on vent Problem: Pain Managment: Goal: General experience of comfort will improve Outcome: Progressing  Pt on Dilaudid GTT Problem: Clinical Measurements: Goal: Diagnostic test results will improve Outcome: Progressing   Problem: Cardiovascular: Goal: Vascular access site(s) Level 0-1 will be maintained Outcome: Progressing  Weaning TR Band

## 2019-10-26 NOTE — Progress Notes (Signed)
Patient ID: William Crawford, male   DOB: 1970-08-14, 49 y.o.   MRN: [id number] EVENING ROUNDS NOTE :     [address]       [place] [address]             [phone]                 1 Day Post-Op Procedure(s) (LRB): TRANSESOPHAGEAL ECHOCARDIOGRAM (TEE) (N/A) Median Sternotomy. Cannulation For Ecmo (Extracorporeal Membrane Oxygenation) @ 3559 Application Of Wound Vac Chest Exploration for repair of coronary perforation (repair of LAD)  Total Length of Stay:  LOS: 1 day  BP 94/76   Pulse 88   Temp (!) 97.5 F (36.4 C) (Core)   Resp 17   Ht 5' 8.5" (1.74 m)   Wt 83.9 kg   SpO2 100%   BMI 27.72 kg/m   .Intake/Output      06/02 0701 - 06/03 0700 06/03 0701 - 06/04 0700   I.V. (mL/kg) 6036.5 (71.9) 1659.4 (19.8)   Blood 2523 315   NG/GT 90 155.3   IV Piggyback 944.8 634.8   Total Intake(mL/kg) 9594.3 (114.4) 2764.5 (32.9)   Urine (mL/kg/hr) 4500 (2.2) 1350 (1.4)   Emesis/NG output  400   Drains 0 0   Blood 1200    Chest Tube 270 110   Total Output 5970 1860   Net +3624.3 +904.5          . sodium chloride    . sodium chloride 10 mL/hr at 10/26/19 1800  . sodium chloride    . sodium chloride    . sodium chloride 75 mL/hr at 10/26/19 1800  . albumin human    . ceFEPime (MAXIPIME) IV Stopped (10/26/19 1235)  . dexmedetomidine (PRECEDEX) IV infusion 0.7 mcg/kg/hr (10/26/19 1800)  . dextrose 5 % and 0.45% NaCl    . epinephrine 1 mcg/min (10/26/19 1800)  . heparin 1,000 Units/hr (10/26/19 1800)  . HYDROmorphone 2 mg/hr (10/26/19 1809)  . insulin 0.2 mL/hr at 10/26/19 1800  . lactated ringers    . lactated ringers 10 mL/hr at 10/26/19 1800  . lactated ringers    . midazolam 2 mg/hr (10/26/19 1800)  . milrinone 0.375 mcg/kg/min (10/26/19 1800)  . nitroGLYCERIN    . norepinephrine (LEVOPHED) Adult infusion 1.5 mcg/min (10/26/19 1800)  . phenylephrine (NEO-SYNEPHRINE) Adult infusion    . thiamine injection Stopped (10/26/19 0955)  . vancomycin  Stopped (10/26/19 1447)  . vasopressin (PITRESSIN) infusion - *FOR SHOCK* Stopped (10/26/19 1709)     Lab Results  Component Value Date   WBC 6.1 10/26/2019   HGB 7.1 (L) 10/26/2019   HCT 21.0 (L) 10/26/2019   PLT 164 10/26/2019   GLUCOSE 122 (H) 10/26/2019   CHOL 193 10/18/2019   TRIG 171 (H) 10/18/2019   HDL 37 (L) 10/18/2019   LDLCALC 122 (H) 10/18/2019   ALT 86 (H) 10/26/2019   AST 249 (H) 10/26/2019   NA 140 10/26/2019   K 3.7 10/26/2019   CL 107 10/26/2019   CREATININE 0.86 10/26/2019   BUN 6 10/26/2019   CO2 26 10/26/2019   INR 1.2 10/26/2019   HGBA1C 5.2 10/18/2019   Stable day Getting pack cells for low hgb   Grace Isaac MD  Beeper [phone] Office [phone] 10/26/2019 6:50 PM

## 2019-10-26 NOTE — Progress Notes (Addendum)
NAME:  GRAYER SPROLES, MRN:  [id number], DOB:  Sep 24, 1970, LOS: 1 ADMISSION DATE:  10/25/2019, CONSULTATION DATE:  10/25/19 REFERRING MD:  [place], CHIEF COMPLAINT:  Heart failure  Brief History   49 yo male admitted to ICU on 10/25/19 following CTO PCI to LAD at which time he unfortunately developed a perforated LAD and went into vfib arrest. He subsequently underwent emergent repair however was unable to be removed from bypass following the procedure. PCCM consulted for ECMO.  History of present illness   Pt initially presented with unstable angina on 5/25 at which time he underwent a heart cath which revealed occlusive disease involving the RCA as well as some chronic occlusions involving the first OM branch and proximal LAD. DES placed to the RCA lesion. He was subsequently discharged on DAPT and returned today for stent placement to the LAD.  He, unfortunately, developed a large perforation to the LAD during the procedure subsequently undergoing vfib arrest (time to ROSC 18mn) and was taken for emergent repair.  Past Medical History  Tobacco use, alcohol use, HFrEF, CAD s/p DES placement 5/25  Significant Hospital Events   6/2 LAD perforation during DES placement>Vfib arrest (14 min)>emergent repair>unable to wean bypass>PCCM consulted for ECMO  Consults:  PCCM Cardiology CT surgery  Procedures:  6/2 PCI 6/2 LAD repair 6/2 ECMO  ETT 6/2>> Central line 6/2>>  Significant Diagnostic Tests:  5/25 echocardiogram>>LVEF 35-40% with regional wall motion abnormalities. Severe inferolateral hypokinesis; G1DD. No valvular disease  Micro Data:  n/a  Antimicrobials:  n/a   Interim history/subjective:  Did well overnight. Was able to back down on pressor support.  Objective   Blood pressure 108/77, pulse 89, temperature 97.7 F (36.5 C), resp. rate 11, height 5' 8.5" (1.74 m), weight 83.9 kg, SpO2 100 %. PAP: (16-30)/(12-22) 22/19 CVP:  [10 mmHg-21 mmHg] 12 mmHg  Vent Mode:  PCV FiO2 (%):  [30 %-50 %] 30 % Set Rate:  [10 bmp] 10 bmp PEEP:  [10 cmH20] 10 cmH20 Plateau Pressure:  [18 cmH20-20 cmH20] 20 cmH20   Intake/Output Summary (Last 24 hours) at 10/26/2019 0749 Last data filed at 10/26/2019 0700 Gross per 24 hour  Intake 9564.29 ml  Output 5970 ml  Net 3594.29 ml   Filed Weights   10/25/19 0650  Weight: 83.9 kg    Examination: General: critically ill appearing HENT: ETT Lungs: mechanical breath sounds appreciated bilaterally Cardiovascular: mid-sternotomy incision. Cannulated. Regular rate/rhythm.  Abdomen: non-distended. bs active Extremities: warm Neuro: sedated GU: foley  Resolved Hospital Problem list   Pericardial tamponade--2/2 LAD perforation  Assessment & Plan:   Cardiogenic shock. EF 45% prior to OR. Post Vfib arrest echo suggestive of biventricular failure. Decompensated heart failure s/p thoracotomy for repair of perforation of LAD during PCI. Unable to be weaned from bypass in OR Improved pulsatility this morning. Backing down on pressor/iatrogenic support Post operative anemia. hgb 7.0 post op. Hgb 8.2 this morning Plan Management per cardiology--d/c lidocaine. Continue to wean pressors--epi, levo, midodrine, vasopressin. Continue heparin--started around 0300. hgb checks q4h.  CVP monitoring. Echo today ECMO protocol Resume plavix and aspirin Resume statin  ICM. HFrEF. CAD. HTN.  NSTEMI s/p DES to RCA 5/25.  Plan Hold home meds Strict I/o, tele monitoring, daily weights  Critical illness requiring mechanical ventilation.  Tobacco use--1ppd smoker CXR this morning showing ETT around level of corina--pulled back. Plan: continue PSV.  daily SBTs/WUA. VAP bundle. Repeat CXR to evaluate ETT placement  High risk alcohol use.  Plan: one time  dose of phenobarbitol. CIWA with ativan. High dose thiamine. Folate supplementation.  High risk malnutrition. Nutrition consulted to start tube feeds.  Best practice:  Diet: NPO.  Tube feeds Pain/Anxiety/Delirium protocol (if indicated): dilaudid, precedex, versed VAP protocol (if indicated): yes DVT prophylaxis: heparin gtt GI prophylaxis: pepcid Glucose control: per primary Mobility: BR Code Status: Full Family Communication: per primary Disposition: ICU  Labs   CBC: Recent Labs  Lab 10/25/19 1459 10/25/19 1858 10/25/19 1859 10/26/19 0050 10/26/19 0053 10/26/19 0305 10/26/19 0314  WBC  --   --  7.3  --  6.2 5.8  --   HGB 7.0*   < > 7.8* 7.8* 8.6* 8.2* 7.5*  HCT 21.4*   < > 23.5* 23.0* 26.2* 24.4* 22.0*  MCV  --   --  91.1  --  88.5 87.5  --   PLT 249  --  216  --  234 197  --    < > = values in this interval not displayed.    Basic Metabolic Panel: Recent Labs  Lab 10/25/19 1858 10/25/19 2027 10/26/19 0050 10/26/19 0305 10/26/19 0314  NA 146* 142 142  --  142  K 3.8 4.3 5.1  --  4.4  CL  --  110  --   --   --   CO2  --  25  --   --   --   GLUCOSE  --  136*  --   --   --   BUN  --  9  --   --   --   CREATININE  --  0.99  --   --   --   CALCIUM  --  7.3*  --   --   --   MG  --   --   --  2.2  --    GFR: Estimated Creatinine Clearance: 98.2 mL/min (by C-G formula based on SCr of 0.99 mg/dL). Recent Labs  Lab 10/25/19 1859 10/25/19 2027 10/26/19 0053 10/26/19 0305  WBC 7.3  --  6.2 5.8  LATICACIDVEN 4.5* 3.2*  --  2.5*    Liver Function Tests: Recent Labs  Lab 10/25/19 2027 10/26/19 0305  AST 222* 274*  ALT 87* 90*  ALKPHOS 38 35*  BILITOT 1.3* 1.1  PROT 4.6* 4.6*  ALBUMIN 2.7* 2.7*   No results for input(s): LIPASE, AMYLASE in the last 168 hours. No results for input(s): AMMONIA in the last 168 hours.  ABG    Component Value Date/Time   PHART 7.368 10/26/2019 0314   PCO2ART 51.4 (H) 10/26/2019 0314   PO2ART 429 (H) 10/26/2019 0314   HCO3 29.8 (H) 10/26/2019 0314   TCO2 31 10/26/2019 0314   O2SAT 100.0 10/26/2019 0314     Coagulation Profile: Recent Labs  Lab 10/25/19 1859 10/26/19 0305  INR 1.2 1.2     Cardiac Enzymes: No results for input(s): CKTOTAL, CKMB, CKMBINDEX, TROPONINI in the last 168 hours.  HbA1C: Hgb A1c MFr Bld  Date/Time Value Ref Range Status  10/18/2019 05:32 AM 5.2 4.8 - 5.6 % Final    Comment:    (NOTE) Pre diabetes:          5.7%-6.4% Diabetes:              >6.4% Glycemic control for   <7.0% adults with diabetes     CBG: Recent Labs  Lab 10/26/19 0048 10/26/19 0142 10/26/19 0316 10/26/19 0443 10/26/19 0701  GLUCAP 112* 99 102* 103* 85  Mitzi Hansen, MD INTERNAL MEDICINE RESIDENT PGY-1 10/26/19  7:49 AM

## 2019-10-26 NOTE — Progress Notes (Signed)
Chaplain engaged in initial visit with William Crawford and his mother.  Mother shared that this has been a great test of her faith.  Chaplain affirmed how hard it is to see her child in this condition and state.  Mother let chaplain know that a lot of people have been praying for [place].  Mother is a Company secretary and has been depending on God and trusting God.  Mother shared about William Crawford with chaplain.  William Crawford is not religious, has a girlfriend, and has somked throughout his life per mom.  Mother noted that she would like her pastor to be able to come in and chaplain told mom about speaking with the nurse about visitation, as well as a virtual tele-visit. Chaplain offered support and the ministries of presence and listening.

## 2019-10-26 NOTE — Progress Notes (Signed)
ANTICOAGULATION CONSULT NOTE   Pharmacy Consult for IV Heparin Indication: ECMO  No Known Allergies  Patient Measurements: Height: 5' 8.5" (174 cm) Weight: 83.9 kg (185 lb) IBW/kg (Calculated) : 69.55 Heparin Dosing Weight: 83.9 kg  Vital Signs: Temp: 97.9 F (36.6 C) (06/03 2100) Temp Source: Core (06/03 1828) BP: 94/76 (06/03 1547) Pulse Rate: 88 (06/03 2100)  Labs: Recent Labs     0000 10/25/19 1859 10/25/19 2027 10/26/19 0050 10/26/19 0053 10/26/19 0053 10/26/19 0305 10/26/19 0314 10/26/19 0907 10/26/19 1011 10/26/19 1015 10/26/19 1400 10/26/19 1600 10/26/19 1600 10/26/19 1619 10/26/19 1619 10/26/19 2124 10/26/19 2131  HGB  --  7.8*  --    < > 8.6*   < > 8.2*   < >  --    < >   < >  --  8.2*   < > 7.1*   < > 8.7* 7.5*  HCT  --  23.5*  --    < > 26.2*   < > 24.4*   < >  --    < >   < >  --  24.6*   < > 21.0*  --  25.7* 22.0*  PLT   < > 216  --   --  234   < > 197  --   --   --   --   --  164  --   --   --  153  --   APTT  --  47*  --   --  31  --   --   --   --   --   --   --  52*  --   --   --   --   --   LABPROT  --  15.1  --   --   --   --  14.7  --   --   --   --   --   --   --   --   --   --   --   INR  --  1.2  --   --   --   --  1.2  --   --   --   --   --   --   --   --   --   --   --   HEPARINUNFRC   < >  --  0.10*  --  <0.10*   < > <0.10*   < > <0.10*  --   --  <0.10*  --   --   --   --  0.15*  --   CREATININE  --   --  0.99  --   --   --   --   --  0.90  --   --   --  0.86  --   --   --   --   --    < > = values in this interval not displayed.    Estimated Creatinine Clearance: 113.1 mL/min (by C-G formula based on SCr of 0.86 mg/dL).   Medical History: Past Medical History:  Diagnosis Date  . Alcohol abuse   . GERD (gastroesophageal reflux disease)   . Tobacco abuse     Assessment: 49 year old male initiated on [place] ECMO.  Received Heparin bolus of 68088 units in OR at time of cannulation Hemoglobin low but improved 7>8.7. Platelets 200.   S/p oozing from iv site > stitched and improved  Previously discussed with Dr. Prescott Gum - Heparin systemically  at 500 units/hr without titration overnight. This am during rounds instructed to increase heparin drip to goal 0.2-0.4.    Heparin level now trending up on 1000 units/h. Good flows, no S/Sx bleeding, Hgb 8.7.  Goal of Therapy:  Heparin Level 0.2-0.4 Monitor platelets by anticoagulation protocol: Yes   Plan:  -Will increase heparin to 1050 units/h -Continue q6h heparin level checks until therapeutic   Arrie Senate, PharmD, BCPS Clinical Pharmacist [phone] Please check AMION for all [place] numbers 10/26/2019

## 2019-10-26 NOTE — Progress Notes (Signed)
ANTICOAGULATION CONSULT NOTE   Pharmacy Consult for IV Heparin Indication: ECMO  No Known Allergies  Patient Measurements: Height: 5' 8.5" (174 cm) Weight: 83.9 kg (185 lb) IBW/kg (Calculated) : 69.55 Heparin Dosing Weight: 83.9 kg  Vital Signs: Temp: 97.5 F (36.4 C) (06/02 2300) Temp Source: Core (06/02 1845) BP: 108/77 (06/02 1840) Pulse Rate: 90 (06/02 2343)  Labs: Recent Labs    10/25/19 1859 10/25/19 2027 10/26/19 0050 10/26/19 0053 10/26/19 0053 10/26/19 0305 10/26/19 0314  HGB 7.8*  --    < > 8.6*   < > 8.2* 7.5*  HCT 23.5*  --    < > 26.2*  --  24.4* 22.0*  PLT 216  --   --  234  --  197  --   APTT 47*  --   --  31  --   --   --   LABPROT 15.1  --   --   --   --  14.7  --   INR 1.2  --   --   --   --  1.2  --   HEPARINUNFRC  --  0.10*  --  <0.10*  --  <0.10*  --   CREATININE  --  0.99  --   --   --   --   --    < > = values in this interval not displayed.    Estimated Creatinine Clearance: 98.2 mL/min (by C-G formula based on SCr of 0.99 mg/dL).   Medical History: Past Medical History:  Diagnosis Date  . Alcohol abuse   . GERD (gastroesophageal reflux disease)   . Tobacco abuse     Assessment: 49 year old male initiated on [place] ECMO at 10. Received [place] bolus of 69450 units at 1325. Hemoglobin low at 7 >> 7.8. Platelets 216.   Previously discussed with Dr. Prescott Gum - Heparin systemically  at 500 units/hr without titration overnight.   Hep lvl currently undetectable but good flows through ECMO circuit per RN/RT  Goal of Therapy:  Heparin level 0.3 to 0.5 units/ml Monitor platelets by anticoagulation protocol: Yes   Plan:  Continue Heparin IV 500 units/hr per Dr. Prescott Gum - no titration for now Will continue Heparin levels every 6 hours until therapeutic x2 then every 12 hours thereafter.   Barth Kirks, PharmD, BCPS, BCCCP Clinical Pharmacist [phone]  Please check AMION for all [place] numbers  10/26/2019 4:23 AM

## 2019-10-27 ENCOUNTER — Inpatient Hospital Stay (HOSPITAL_COMMUNITY): Payer: BLUE CROSS/BLUE SHIELD

## 2019-10-27 ENCOUNTER — Inpatient Hospital Stay: Payer: Self-pay

## 2019-10-27 DIAGNOSIS — Z720 Tobacco use: Secondary | ICD-10-CM

## 2019-10-27 DIAGNOSIS — E876 Hypokalemia: Secondary | ICD-10-CM

## 2019-10-27 DIAGNOSIS — J9601 Acute respiratory failure with hypoxia: Secondary | ICD-10-CM

## 2019-10-27 DIAGNOSIS — Z9911 Dependence on respirator [ventilator] status: Secondary | ICD-10-CM

## 2019-10-27 LAB — COMPREHENSIVE METABOLIC PANEL
ALT: 72 U/L — ABNORMAL HIGH (ref 0–44)
AST: 150 U/L — ABNORMAL HIGH (ref 15–41)
Albumin: 2.4 g/dL — ABNORMAL LOW (ref 3.5–5.0)
Alkaline Phosphatase: 42 U/L (ref 38–126)
Anion gap: 9 (ref 5–15)
BUN: 6 mg/dL (ref 6–20)
CO2: 25 mmol/L (ref 22–32)
Calcium: 7.5 mg/dL — ABNORMAL LOW (ref 8.9–10.3)
Chloride: 107 mmol/L (ref 98–111)
Creatinine, Ser: 0.81 mg/dL (ref 0.61–1.24)
GFR calc Af Amer: >60 mL/min (ref >60)
GFR calc non Af Amer: >60 mL/min (ref >60)
Glucose, Bld: 99 mg/dL (ref 70–99)
Potassium: 3.4 mmol/L — ABNORMAL LOW (ref 3.5–5.1)
Sodium: 141 mmol/L (ref 135–145)
Total Bilirubin: 0.8 mg/dL (ref 0.3–1.2)
Total Protein: 4.4 g/dL — ABNORMAL LOW (ref 6.5–8.1)

## 2019-10-27 LAB — COOXEMETRY PANEL
Carboxyhemoglobin: 1.8 % — ABNORMAL HIGH (ref 0.5–1.5)
Methemoglobin: 1.5 % (ref 0.0–1.5)
O2 Saturation: 82.7 %
Total hemoglobin: 8.9 g/dL — ABNORMAL LOW (ref 12.0–16.0)

## 2019-10-27 LAB — POCT I-STAT 7, (LYTES, BLD GAS, ICA,H+H)
Acid-Base Excess: 3 mmol/L — ABNORMAL HIGH (ref 0.0–2.0)
Acid-Base Excess: 6 mmol/L — ABNORMAL HIGH (ref 0.0–2.0)
Acid-Base Excess: 6 mmol/L — ABNORMAL HIGH (ref 0.0–2.0)
Acid-Base Excess: 7 mmol/L — ABNORMAL HIGH (ref 0.0–2.0)
Acid-Base Excess: 3 mmol/L — ABNORMAL HIGH (ref 0.0–2.0)
Acid-Base Excess: 5 mmol/L — ABNORMAL HIGH (ref 0.0–2.0)
Bicarbonate: 29 mmol/L — ABNORMAL HIGH (ref 20.0–28.0)
Bicarbonate: 31.3 mmol/L — ABNORMAL HIGH (ref 20.0–28.0)
Bicarbonate: 29.8 mmol/L — ABNORMAL HIGH (ref 20.0–28.0)
Bicarbonate: 30.2 mmol/L — ABNORMAL HIGH (ref 20.0–28.0)
Bicarbonate: 26.3 mmol/L (ref 20.0–28.0)
Bicarbonate: 29.5 mmol/L — ABNORMAL HIGH (ref 20.0–28.0)
Calcium, Ion: 1.19 mmol/L (ref 1.15–1.40)
Calcium, Ion: 0.95 mmol/L — ABNORMAL LOW (ref 1.15–1.40)
Calcium, Ion: 1.15 mmol/L (ref 1.15–1.40)
Calcium, Ion: 1.16 mmol/L (ref 1.15–1.40)
Calcium, Ion: 1.15 mmol/L (ref 1.15–1.40)
Calcium, Ion: 1.15 mmol/L (ref 1.15–1.40)
HCT: 21 % — ABNORMAL LOW (ref 39.0–52.0)
HCT: 28 % — ABNORMAL LOW (ref 39.0–52.0)
HCT: 26 % — ABNORMAL LOW (ref 39.0–52.0)
HCT: 26 % — ABNORMAL LOW (ref 39.0–52.0)
HCT: 26 % — ABNORMAL LOW (ref 39.0–52.0)
HCT: 23 % — ABNORMAL LOW (ref 39.0–52.0)
Hemoglobin: 7.1 g/dL — ABNORMAL LOW (ref 13.0–17.0)
Hemoglobin: 9.5 g/dL — ABNORMAL LOW (ref 13.0–17.0)
Hemoglobin: 8.8 g/dL — ABNORMAL LOW (ref 13.0–17.0)
Hemoglobin: 8.8 g/dL — ABNORMAL LOW (ref 13.0–17.0)
Hemoglobin: 8.8 g/dL — ABNORMAL LOW (ref 13.0–17.0)
Hemoglobin: 7.8 g/dL — ABNORMAL LOW (ref 13.0–17.0)
O2 Saturation: 100 %
O2 Saturation: 100 %
O2 Saturation: 100 %
O2 Saturation: 100 %
O2 Saturation: 100 %
O2 Saturation: 100 %
Patient temperature: 36.3
Patient temperature: 36.9
Patient temperature: 36.6
Patient temperature: 36.8
Patient temperature: 36.7
Patient temperature: 36.5
Potassium: 3.4 mmol/L — ABNORMAL LOW (ref 3.5–5.1)
Potassium: 3.8 mmol/L (ref 3.5–5.1)
Potassium: 3.4 mmol/L — ABNORMAL LOW (ref 3.5–5.1)
Potassium: 2.9 mmol/L — ABNORMAL LOW (ref 3.5–5.1)
Potassium: 2.7 mmol/L — CL (ref 3.5–5.1)
Potassium: 3.6 mmol/L (ref 3.5–5.1)
Sodium: 143 mmol/L (ref 135–145)
Sodium: 156 mmol/L — ABNORMAL HIGH (ref 135–145)
Sodium: 142 mmol/L (ref 135–145)
Sodium: 143 mmol/L (ref 135–145)
Sodium: 144 mmol/L (ref 135–145)
Sodium: 143 mmol/L (ref 135–145)
TCO2: 30 mmol/L (ref 22–32)
TCO2: 33 mmol/L — ABNORMAL HIGH (ref 22–32)
TCO2: 31 mmol/L (ref 22–32)
TCO2: 31 mmol/L (ref 22–32)
TCO2: 27 mmol/L (ref 22–32)
TCO2: 31 mmol/L (ref 22–32)
pCO2 arterial: 47 mmHg (ref 32.0–48.0)
pCO2 arterial: 50.8 mmHg — ABNORMAL HIGH (ref 32.0–48.0)
pCO2 arterial: 38.4 mmHg (ref 32.0–48.0)
pCO2 arterial: 37.2 mmHg (ref 32.0–48.0)
pCO2 arterial: 34.1 mmHg (ref 32.0–48.0)
pCO2 arterial: 41.8 mmHg (ref 32.0–48.0)
pH, Arterial: 7.395 (ref 7.350–7.450)
pH, Arterial: 7.398 (ref 7.350–7.450)
pH, Arterial: 7.497 — ABNORMAL HIGH (ref 7.350–7.450)
pH, Arterial: 7.517 — ABNORMAL HIGH (ref 7.350–7.450)
pH, Arterial: 7.495 — ABNORMAL HIGH (ref 7.350–7.450)
pH, Arterial: 7.454 — ABNORMAL HIGH (ref 7.350–7.450)
pO2, Arterial: 318 mmHg — ABNORMAL HIGH (ref 83.0–108.0)
pO2, Arterial: 282 mmHg — ABNORMAL HIGH (ref 83.0–108.0)
pO2, Arterial: 340 mmHg — ABNORMAL HIGH (ref 83.0–108.0)
pO2, Arterial: 327 mmHg — ABNORMAL HIGH (ref 83.0–108.0)
pO2, Arterial: 316 mmHg — ABNORMAL HIGH (ref 83.0–108.0)
pO2, Arterial: 355 mmHg — ABNORMAL HIGH (ref 83.0–108.0)

## 2019-10-27 LAB — MAGNESIUM
Magnesium: 1.9 mg/dL (ref 1.7–2.4)
Magnesium: 2 mg/dL (ref 1.7–2.4)

## 2019-10-27 LAB — BASIC METABOLIC PANEL
Anion gap: 10 (ref 5–15)
BUN: 6 mg/dL (ref 6–20)
CO2: 25 mmol/L (ref 22–32)
Calcium: 7.8 mg/dL — ABNORMAL LOW (ref 8.9–10.3)
Chloride: 105 mmol/L (ref 98–111)
Creatinine, Ser: 0.8 mg/dL (ref 0.61–1.24)
GFR calc Af Amer: >60 mL/min (ref >60)
GFR calc non Af Amer: >60 mL/min (ref >60)
Glucose, Bld: 120 mg/dL — ABNORMAL HIGH (ref 70–99)
Potassium: 2.9 mmol/L — ABNORMAL LOW (ref 3.5–5.1)
Sodium: 140 mmol/L (ref 135–145)

## 2019-10-27 LAB — CBC
HCT: 24.8 % — ABNORMAL LOW (ref 39.0–52.0)
HCT: 25 % — ABNORMAL LOW (ref 39.0–52.0)
Hemoglobin: 8.3 g/dL — ABNORMAL LOW (ref 13.0–17.0)
Hemoglobin: 8.5 g/dL — ABNORMAL LOW (ref 13.0–17.0)
MCH: 29.1 pg (ref 26.0–34.0)
MCH: 29.2 pg (ref 26.0–34.0)
MCHC: 33.5 g/dL (ref 30.0–36.0)
MCHC: 34 g/dL (ref 30.0–36.0)
MCV: 87 fL (ref 80.0–100.0)
MCV: 85.9 fL (ref 80.0–100.0)
Platelets: 145 10*3/uL — ABNORMAL LOW (ref 150–400)
Platelets: 149 10*3/uL — ABNORMAL LOW (ref 150–400)
RBC: 2.85 MIL/uL — ABNORMAL LOW (ref 4.22–5.81)
RBC: 2.91 MIL/uL — ABNORMAL LOW (ref 4.22–5.81)
RDW: 14 % (ref 11.5–15.5)
RDW: 13.9 % (ref 11.5–15.5)
WBC: 6.2 10*3/uL (ref 4.0–10.5)
WBC: 6.1 10*3/uL (ref 4.0–10.5)
nRBC: 0 % (ref 0.0–0.2)
nRBC: 0 % (ref 0.0–0.2)

## 2019-10-27 LAB — APTT
aPTT: 71 seconds — ABNORMAL HIGH (ref 24–36)
aPTT: 123 seconds — ABNORMAL HIGH (ref 24–36)

## 2019-10-27 LAB — GLUCOSE, CAPILLARY
Glucose-Capillary: 102 mg/dL — ABNORMAL HIGH (ref 70–99)
Glucose-Capillary: 107 mg/dL — ABNORMAL HIGH (ref 70–99)
Glucose-Capillary: 110 mg/dL — ABNORMAL HIGH (ref 70–99)
Glucose-Capillary: 111 mg/dL — ABNORMAL HIGH (ref 70–99)
Glucose-Capillary: 113 mg/dL — ABNORMAL HIGH (ref 70–99)
Glucose-Capillary: 114 mg/dL — ABNORMAL HIGH (ref 70–99)
Glucose-Capillary: 114 mg/dL — ABNORMAL HIGH (ref 70–99)
Glucose-Capillary: 118 mg/dL — ABNORMAL HIGH (ref 70–99)
Glucose-Capillary: 122 mg/dL — ABNORMAL HIGH (ref 70–99)
Glucose-Capillary: 124 mg/dL — ABNORMAL HIGH (ref 70–99)
Glucose-Capillary: 132 mg/dL — ABNORMAL HIGH (ref 70–99)
Glucose-Capillary: 135 mg/dL — ABNORMAL HIGH (ref 70–99)
Glucose-Capillary: 138 mg/dL — ABNORMAL HIGH (ref 70–99)
Glucose-Capillary: 143 mg/dL — ABNORMAL HIGH (ref 70–99)
Glucose-Capillary: 144 mg/dL — ABNORMAL HIGH (ref 70–99)
Glucose-Capillary: 151 mg/dL — ABNORMAL HIGH (ref 70–99)
Glucose-Capillary: 85 mg/dL (ref 70–99)
Glucose-Capillary: 95 mg/dL (ref 70–99)
Glucose-Capillary: 97 mg/dL (ref 70–99)

## 2019-10-27 LAB — PHOSPHORUS
Phosphorus: 2.1 mg/dL — ABNORMAL LOW (ref 2.5–4.6)
Phosphorus: 1.1 mg/dL — ABNORMAL LOW (ref 2.5–4.6)

## 2019-10-27 LAB — PROTIME-INR
INR: 1.3 — ABNORMAL HIGH (ref 0.8–1.2)
Prothrombin Time: 15.4 seconds — ABNORMAL HIGH (ref 11.4–15.2)

## 2019-10-27 LAB — FIBRINOGEN: Fibrinogen: 657 mg/dL — ABNORMAL HIGH (ref 210–475)

## 2019-10-27 LAB — SURGICAL PCR SCREEN
MRSA, PCR: NEGATIVE
Staphylococcus aureus: NEGATIVE

## 2019-10-27 LAB — PREPARE RBC (CROSSMATCH)

## 2019-10-27 LAB — SURGICAL PATHOLOGY

## 2019-10-27 LAB — LACTIC ACID, PLASMA: Lactic Acid, Venous: 0.7 mmol/L (ref 0.5–1.9)

## 2019-10-27 LAB — HEPARIN LEVEL (UNFRACTIONATED)
Heparin Unfractionated: 0.15 IU/mL — ABNORMAL LOW (ref 0.30–0.70)
Heparin Unfractionated: 0.16 IU/mL — ABNORMAL LOW (ref 0.30–0.70)
Heparin Unfractionated: 0.33 IU/mL (ref 0.30–0.70)

## 2019-10-27 LAB — LACTATE DEHYDROGENASE: LDH: 594 U/L — ABNORMAL HIGH (ref 98–192)

## 2019-10-27 MED ORDER — POTASSIUM CHLORIDE 10 MEQ/50ML IV SOLN
10.0000 meq | INTRAVENOUS | Status: DC
  Administered 2019-10-27: 10 meq via INTRAVENOUS
  Filled 2019-10-27: qty 50

## 2019-10-27 MED ORDER — POTASSIUM & SODIUM PHOSPHATES 280-160-250 MG PO PACK
2.0000 | PACK | Freq: Once | ORAL | Status: AC
  Administered 2019-10-27: 2
  Filled 2019-10-27: qty 2

## 2019-10-27 MED ORDER — SODIUM CHLORIDE 0.9% FLUSH
10.0000 mL | Freq: Two times a day (BID) | INTRAVENOUS | Status: DC
  Administered 2019-10-27: 40 mL
  Administered 2019-10-27: 10 mL
  Administered 2019-10-28: 30 mL
  Administered 2019-10-28 – 2019-10-29 (×2): 10 mL
  Administered 2019-10-29: 30 mL
  Administered 2019-10-30: 10 mL
  Administered 2019-10-31: 20 mL
  Administered 2019-10-31 – 2019-11-01 (×2): 10 mL
  Administered 2019-11-02: 20 mL
  Administered 2019-11-02 – 2019-11-05 (×3): 10 mL
  Administered 2019-11-05: 20 mL
  Administered 2019-11-06 – 2019-11-10 (×6): 10 mL

## 2019-10-27 MED ORDER — MAGNESIUM SULFATE 2 GM/50ML IV SOLN
2.0000 g | Freq: Once | INTRAVENOUS | Status: AC
  Administered 2019-10-27: 2 g via INTRAVENOUS
  Filled 2019-10-27: qty 50

## 2019-10-27 MED ORDER — POTASSIUM CHLORIDE 10 MEQ/50ML IV SOLN
10.0000 meq | INTRAVENOUS | Status: AC
  Filled 2019-10-27: qty 50

## 2019-10-27 MED ORDER — POTASSIUM CHLORIDE 10 MEQ/50ML IV SOLN
10.0000 meq | INTRAVENOUS | Status: AC
  Administered 2019-10-27: 10 meq via INTRAVENOUS
  Filled 2019-10-27: qty 50

## 2019-10-27 MED ORDER — POTASSIUM CHLORIDE 10 MEQ/50ML IV SOLN: 10.0000 meq | INTRAVENOUS | Status: AC

## 2019-10-27 MED ORDER — FUROSEMIDE 10 MG/ML IJ SOLN
40.0000 mg | Freq: Two times a day (BID) | INTRAMUSCULAR | Status: AC
  Administered 2019-10-27 (×2): 40 mg via INTRAVENOUS
  Filled 2019-10-27 (×3): qty 4

## 2019-10-27 MED ORDER — POTASSIUM CHLORIDE 10 MEQ/50ML IV SOLN: 10.0000 meq | Freq: Once | INTRAVENOUS | Status: AC

## 2019-10-27 MED ORDER — SODIUM CHLORIDE 0.9% FLUSH: 10.0000 mL | INTRAVENOUS | Status: DC | PRN

## 2019-10-27 MED ORDER — POTASSIUM CHLORIDE 10 MEQ/50ML IV SOLN
10.0000 meq | INTRAVENOUS | Status: AC
  Administered 2019-10-27 (×3): 10 meq via INTRAVENOUS

## 2019-10-27 MED ORDER — HEPARIN (PORCINE) 25000 UT/250ML-% IV SOLN
1600.0000 [IU]/h | INTRAVENOUS | Status: DC
  Administered 2019-10-28: 1300 [IU]/h via INTRAVENOUS
  Administered 2019-10-29: 1550 [IU]/h via INTRAVENOUS
  Administered 2019-10-29: 1600 [IU]/h via INTRAVENOUS
  Filled 2019-10-27 (×4): qty 250

## 2019-10-27 MED ORDER — POTASSIUM CHLORIDE 20 MEQ/15ML (10%) PO SOLN
40.0000 meq | ORAL | Status: AC
  Administered 2019-10-27 – 2019-10-28 (×3): 40 meq
  Filled 2019-10-27 (×3): qty 30

## 2019-10-27 MED ORDER — SODIUM PHOSPHATES 45 MMOLE/15ML IV SOLN
30.0000 mmol | Freq: Once | INTRAVENOUS | Status: AC
  Administered 2019-10-27: 30 mmol via INTRAVENOUS
  Filled 2019-10-27: qty 10

## 2019-10-27 MED FILL — Calcium Chloride Inj 10%: INTRAVENOUS | Qty: 10 | Status: AC

## 2019-10-27 MED FILL — Magnesium Sulfate Inj 50%: INTRAMUSCULAR | Qty: 10 | Status: AC

## 2019-10-27 MED FILL — Heparin Sodium (Porcine) Inj 1000 Unit/ML: INTRAMUSCULAR | Qty: 30 | Status: AC

## 2019-10-27 MED FILL — Electrolyte-R (PH 7.4) Solution: INTRAVENOUS | Qty: 4000 | Status: AC

## 2019-10-27 MED FILL — Heparin Sodium (Porcine) Inj 1000 Unit/ML: INTRAMUSCULAR | Qty: 50 | Status: AC

## 2019-10-27 MED FILL — Sodium Chloride IV Soln 0.9%: INTRAVENOUS | Qty: 2000 | Status: AC

## 2019-10-27 MED FILL — Mannitol IV Soln 20%: INTRAVENOUS | Qty: 500 | Status: AC

## 2019-10-27 MED FILL — Potassium Chloride Inj 2 mEq/ML: INTRAVENOUS | Qty: 40 | Status: AC

## 2019-10-27 MED FILL — Sodium Bicarbonate IV Soln 8.4%: INTRAVENOUS | Qty: 250 | Status: AC

## 2019-10-27 NOTE — Procedures (Signed)
Arterial Catheter Insertion Procedure Note William Crawford [id number] 1971/04/04  Procedure: Insertion of Arterial Catheter  Indications: Blood pressure monitoring and frequent blood sampling.  Procedure Details Consent: Unable to obtain consent because of altered level of consciousness. Time Out: Verified patient identification, verified procedure, site/side was marked, verified correct patient position, special equipment/implants available, medications/allergies/relevent history reviewed, required imaging and test results available.  Performed  Maximum sterile technique was used including cap, gloves, gown, hand hygiene, mask and sheet. Skin prep: Chlorhexidine; local anesthetic administered 20 gauge catheter was inserted into right radial artery using the Seldinger technique. ULTRASOUND GUIDANCE USED: NO Evaluation Blood flow good; BP tracing good. Complications: No apparent complications.   Claretta Fraise 10/27/2019

## 2019-10-27 NOTE — Plan of Care (Signed)
  Problem: Cardiac: Goal: Will achieve and/or maintain hemodynamic stability Outcome: Progressing   Problem: Clinical Measurements: Goal: Postoperative complications will be avoided or minimized Outcome: Progressing   Problem: Respiratory: Goal: Respiratory status will improve Outcome: Progressing   Problem: Urinary Elimination: Goal: Ability to achieve and maintain adequate renal perfusion and functioning will improve Outcome: Progressing

## 2019-10-27 NOTE — Progress Notes (Addendum)
ANTICOAGULATION CONSULT NOTE   Pharmacy Consult for IV Heparin Indication: ECMO  No Known Allergies  Patient Measurements: Height: 5' 8.5" (174 cm) Weight: 95.2 kg (209 lb 14.1 oz) IBW/kg (Calculated) : 69.55 Heparin Dosing Weight: 83.9 kg  Vital Signs: Temp: 97.7 F (36.5 C) (06/04 2030) BP: 113/81 (06/04 1710) Pulse Rate: 78 (06/04 2030)  Labs: Recent Labs    10/25/19 1859 10/25/19 2027 10/26/19 0305 10/26/19 0314 10/26/19 1600 10/26/19 1619 10/26/19 2124 10/26/19 2131 10/27/19 0351 10/27/19 0358 10/27/19 1205 10/27/19 1206 10/27/19 1556 10/27/19 1556 10/27/19 1606 10/27/19 1630 10/27/19 1800 10/27/19 1812  HGB 7.8*   < > 8.2*   < > 8.2*   < > 8.7*   < > 8.3*   < >   < >  --  8.5*   < > 8.8*  --   --  8.8*  HCT 23.5*   < > 24.4*   < > 24.6*   < > 25.7*   < > 24.8*   < >   < >  --  25.0*  --  26.0*  --   --  26.0*  PLT 216   < > 197   < > 164   < > 153  --  145*  --   --   --  149*  --   --   --   --   --   APTT 47*   < >  --    < > 52*  --   --   --  71*  --   --   --   --   --   --  123*  --   --   LABPROT 15.1  --  14.7  --   --   --   --   --  15.4*  --   --   --   --   --   --   --   --   --   INR 1.2  --  1.2  --   --   --   --   --  1.3*  --   --   --   --   --   --   --   --   --   HEPARINUNFRC  --    < > <0.10*   < >  --    < > 0.15*   < > 0.15*  --   --  0.16*  --   --   --   --  0.33  --   CREATININE  --    < >  --    < > 0.86  --   --   --  0.81  --   --   --  0.80  --   --   --   --   --    < > = values in this interval not displayed.    Estimated Creatinine Clearance: 128.8 mL/min (by C-G formula based on SCr of 0.8 mg/dL).   Medical History: Past Medical History:  Diagnosis Date  . Alcohol abuse   . GERD (gastroesophageal reflux disease)   . Tobacco abuse     Assessment: 49 year old male initiated on [place] ECMO.  Received [place] bolus of 49702 units in OR at time of cannulation Hemoglobin low but improved 7>8.7. Platelets 200.  S/p  oozing from iv site > stitched and improved  Heparin level now therapeutic at 0.33 on  1200 units/h. Will continue current infusion rate and confirm with morning labs. Per PVT, heparin to stop tomorrow morning (6/5) at 0700 in anticipation of femoral A-line removal.  Goal of Therapy:  Heparin Level goal 0.2-0.4 Monitor platelets by anticoagulation protocol: Yes   Plan:  -Continue heparin 1200 units/h - stop 6/5 at 0700 -Check coags q12h   Arrie Senate, PharmD, BCPS Clinical Pharmacist [phone] Please check AMION for all [place] numbers 10/27/2019

## 2019-10-27 NOTE — Progress Notes (Signed)
62m versed and 841mdilaudid wasted and witnessed in sink with MiAllegra GranaRN.

## 2019-10-27 NOTE — Progress Notes (Signed)
Peripherally Inserted Central Catheter Placement  The IV Nurse has discussed with the patient and/or persons authorized to consent for the patient, the purpose of this procedure and the potential benefits and risks involved with this procedure.  The benefits include less needle sticks, lab draws from the catheter, and the patient may be discharged home with the catheter. Risks include, but not limited to, infection, bleeding, blood clot (thrombus formation), and puncture of an artery; nerve damage and irregular heartbeat and possibility to perform a PICC exchange if needed/ordered by physician.  Alternatives to this procedure were also discussed.  Bard Power PICC patient education guide, fact sheet on infection prevention and patient information card has been provided to patient /or left at bedside.    PICC Placement Documentation  PICC Triple Lumen 10/27/19 PICC Right Brachial 40 cm 0 cm (Active)  Indication for Insertion or Continuance of Line Prolonged intravenous therapies 10/27/19 1400  Exposed Catheter (cm) 0 cm 10/27/19 1400  Site Assessment Clean;Intact;Dry 10/27/19 1400  Lumen #1 Status Flushed;Saline locked;Blood return noted 10/27/19 1400  Lumen #2 Status Flushed;Saline locked;Blood return noted 10/27/19 1400  Lumen #3 Status Flushed;Saline locked;Blood return noted 10/27/19 1400  Dressing Type Transparent;Securing device 10/27/19 1400  Dressing Status Clean;Dry;Intact;Antimicrobial disc in place 10/27/19 1400  Dressing Change Due 11/03/19 10/27/19 1400       Holley Bouche [place] 10/27/2019, 2:19 PM

## 2019-10-27 NOTE — Progress Notes (Signed)
NAME:  William Crawford, MRN:  [id number], DOB:  05-Mar-1971, LOS: 2 ADMISSION DATE:  10/25/2019, CONSULTATION DATE:  10/25/19 REFERRING MD:  [place], CHIEF COMPLAINT:  Heart failure  Brief History   49 yo male admitted to ICU on 10/25/19 following CTO PCI to LAD at which time he unfortunately developed a perforated LAD and went into vfib arrest. He subsequently underwent emergent repair however was unable to be removed from bypass following the procedure. PCCM consulted for ECMO.  History of present illness   Pt initially presented with unstable angina on 5/25 at which time he underwent a heart cath which revealed occlusive disease involving the RCA as well as some chronic occlusions involving the first OM branch and proximal LAD. DES placed to the RCA lesion. He was subsequently discharged on DAPT and returned today for stent placement to the LAD.  He, unfortunately, developed a large perforation to the LAD during the procedure subsequently undergoing vfib arrest (time to ROSC 73mn) and was taken for emergent repair.  Past Medical History  Tobacco use, alcohol use, HFrEF, CAD s/p DES placement 5/25  Significant Hospital Events   6/2 LAD perforation during DES placement>Vfib arrest (14 min)>emergent repair>unable to wean bypass>PCCM consulted for ECMO  Consults:  PCCM Cardiology CT surgery  Procedures:  6/2 PCI 6/2 LAD repair 6/2 ECMO  ETT 6/2>> Central line 6/2>>  Significant Diagnostic Tests:  5/25 echocardiogram>>LVEF 35-40% with regional wall motion abnormalities. Severe inferolateral hypokinesis; G1DD. No valvular disease  Micro Data:  n/a  Antimicrobials:  n/a   Interim history/subjective:  No acute events overnight.   Objective   Blood pressure 94/76, pulse 80, temperature (!) 97.5 F (36.4 C), resp. rate 17, height 5' 8.5" (1.74 m), weight 95.2 kg, SpO2 100 %. PAP: (17-28)/(13-20) 27/17 CVP:  [10 mmHg-15 mmHg] 13 mmHg CO:  [6.8 L/min-10.4 L/min] 10.4 L/min CI:   [3.4 L/min/m2-5.2 L/min/m2] 5.2 L/min/m2  Vent Mode: PCV FiO2 (%):  [30 %] 30 % Set Rate:  [10 bmp-1010 bmp] 1010 bmp PEEP:  [10 cmH20] 10 cmH20 Plateau Pressure:  [19 cmH20-20 cmH20] 19 cmH20   Intake/Output Summary (Last 24 hours) at 10/27/2019 0815 Last data filed at 10/27/2019 0600 Gross per 24 hour  Intake 5325.43 ml  Output 3740 ml  Net 1585.43 ml   Filed Weights   10/25/19 0650 10/27/19 0400  Weight: 83.9 kg 95.2 kg    Examination: General: critically ill appearing young man laying in bed in NAD HENT: [place]/AT, eyes anicteric, ETT in place Lungs: CTAB, no tracheal secretions, breathing over the vent. Cardiovascular: mid-sternotomy, regular rate, paced rhythm. Centrally cannulated for ecmo, pleural drain on the left Abdomen: soft, NT, ND Extremities: warm, no clubbing or cyanosis Neuro: Sedated, RASS -4.  Follows commands in all 4 extremities.  GU: foley draining clear yellow urine  VA ecmo 4.4L/min, RMP 3300, sweep 4L delta P 18 P ven -40   Resolved Hospital Problem list   Pericardial tamponade--2/2 LAD perforation  Assessment & Plan:   Cardiogenic shock. EF 45% prior to OR. Post Vfib arrest echo suggestive of biventricular failure. Decompensated heart failure s/p thoracotomy for repair of perforation of LAD during PCI. Unable to be weaned from bypass in OR. On V-A ECMO since 6/2. Mild stable hemolysis, no signs of circuit clotting. Improved pulsatility this morning. Backing down on pressor/iatrogenic support Post operative anemia. hgb 7.0 post op. Hgb 8.2 this morning Plan Appreciate Cardiology and CTS's management  -Continue to wean inotropes & pressors--epi, levo, midodrine, vasopressin. -  Continue heparin & serial LDH. Heparin dosing per pharmacy. -Continue [place] for invasive monitoring -Lasix -Echo protocol -DAPT antiplatelet therapy and statin  ICM. HFrEF. CAD. HTN.  NSTEMI s/p DES to RCA 5/25.  Plan -holding Bblocker, ACE/ ARB due to shock -inotropes  and vasopressors + [place] ECMO -Strict I/os -Telemetry monitoring -Lasix today  Acute hypoxic respiratory failure requiring mechanical ventilation.  Tobacco use--1ppd smoker CXR this morning showing ETT around level of corina--pulled back. Plan: continue PSV.  daily SBTs/WUA. VAP bundle. Repeat CXR to evaluate  -Continue low tidal volume ventilation.  Switch to [facility] due to variable tidal volumes with sedation. -VAP prevention protocol -Sedation is required to tolerate mechanical ventilation.  Continue Precedex, Dilaudid, Versed.  High risk alcohol use Risk for ETOH w/d Plan:  -d/c phenobarbital -con't versed gtt -vitamins  High risk malnutrition  -con't TF at goal  Hypokalemia Hypophosphatemia -repleted -con't to monitor  Best practice:  Diet: NPO. Tube feeds Pain/Anxiety/Delirium protocol (if indicated): dilaudid, precedex, versed VAP protocol (if indicated): yes DVT prophylaxis: heparin gtt GI prophylaxis: pepcid Glucose control: per primary Mobility: BR Code Status: Full Family Communication: mother updated at bedside Disposition: ICU  Labs   CBC: Recent Labs  Lab 10/26/19 0053 10/26/19 0053 10/26/19 0305 10/26/19 0314 10/26/19 1600 10/26/19 1600 10/26/19 1619 10/26/19 2124 10/26/19 2131 10/27/19 0351 10/27/19 0358  WBC 6.2  --  5.8  --  6.1  --   --  6.1  --  6.2  --   HGB 8.6*   < > 8.2*   < > 8.2*   < > 7.1* 8.7* 7.5* 8.3* 7.1*  HCT 26.2*   < > 24.4*   < > 24.6*   < > 21.0* 25.7* 22.0* 24.8* 21.0*  MCV 88.5  --  87.5  --  87.2  --   --  86.8  --  87.0  --   PLT 234  --  197  --  164  --   --  153  --  145*  --    < > = values in this interval not displayed.    Basic Metabolic Panel: Recent Labs  Lab 10/25/19 2027 10/26/19 0050 10/26/19 0305 10/26/19 0314 10/26/19 0907 10/26/19 1011 10/26/19 1600 10/26/19 1619 10/26/19 2131 10/27/19 0351 10/27/19 0358  NA 142   < >  --    < > 141   < > 139 140 142 141 143  K 4.3   < >  --    < > 4.1   < >  3.8 3.7 3.6 3.4* 3.4*  CL 110  --   --   --  108  --  107  --   --  107  --   CO2 25  --   --   --  26  --  26  --   --  25  --   GLUCOSE 136*  --   --   --  117*  --  122*  --   --  99  --   BUN 9  --   --   --  8  --  6  --   --  6  --   CREATININE 0.99  --   --   --  0.90  --  0.86  --   --  0.81  --   CALCIUM 7.3*  --   --   --  7.3*  --  7.4*  --   --  7.5*  --  MG  --   --  2.2  --   --   --  2.1  --   --  1.9  --   PHOS  --   --   --   --   --   --  2.4*  --   --  2.1*  --    < > = values in this interval not displayed.   GFR: Estimated Creatinine Clearance: 127.3 mL/min (by C-G formula based on SCr of 0.81 mg/dL). Recent Labs  Lab 10/25/19 1859 10/25/19 2027 10/26/19 0053 10/26/19 0305 10/26/19 1600 10/26/19 2124 10/27/19 0351  WBC 7.3  --    < > 5.8 6.1 6.1 6.2  LATICACIDVEN 4.5* 3.2*  --  2.5*  --   --  0.7   < > = values in this interval not displayed.    Liver Function Tests: Recent Labs  Lab 10/25/19 2027 10/26/19 0305 10/26/19 0907 10/27/19 0351  AST 222* 274* 249* 150*  ALT 87* 90* 86* 72*  ALKPHOS 38 35* 37* 42  BILITOT 1.3* 1.1 1.1 0.8  PROT 4.6* 4.6* 4.4* 4.4*  ALBUMIN 2.7* 2.7* 2.5* 2.4*   No results for input(s): LIPASE, AMYLASE in the last 168 hours. No results for input(s): AMMONIA in the last 168 hours.  ABG    Component Value Date/Time   PHART 7.395 10/27/2019 0358   PCO2ART 47.0 10/27/2019 0358   PO2ART 318 (H) 10/27/2019 0358   HCO3 29.0 (H) 10/27/2019 0358   TCO2 30 10/27/2019 0358   O2SAT 100.0 10/27/2019 0358     Coagulation Profile: Recent Labs  Lab 10/25/19 1859 10/26/19 0305 10/27/19 0351  INR 1.2 1.2 1.3*    Cardiac Enzymes: No results for input(s): CKTOTAL, CKMB, CKMBINDEX, TROPONINI in the last 168 hours.  HbA1C: Hgb A1c MFr Bld  Date/Time Value Ref Range Status  10/18/2019 05:32 AM 5.2 4.8 - 5.6 % Final    Comment:    (NOTE) Pre diabetes:          5.7%-6.4% Diabetes:              >6.4% Glycemic control for    <7.0% adults with diabetes     CBG: Recent Labs  Lab 10/26/19 2257 10/27/19 0009 10/27/19 0116 10/27/19 0222 10/27/19 0356  GLUCAP 98 110* 135* 111* 95     This patient is critically ill with multiple organ system failure which requires frequent high complexity decision making, assessment, support, evaluation, and titration of therapies. This was completed through the application of advanced monitoring technologies and extensive interpretation of multiple databases. During this encounter critical care time was devoted to patient care services described in this note for 50 minutes.  Julian Hy, DO 10/27/19 6:14 PM [facility]

## 2019-10-27 NOTE — Progress Notes (Signed)
2 Days Post-Op Procedure(s) (LRB): TRANSESOPHAGEAL ECHOCARDIOGRAM (TEE) (N/A) Median Sternotomy. Cannulation For Ecmo (Extracorporeal Membrane Oxygenation) @ 6384 Application Of Wound Vac Chest Exploration for repair of coronary perforation (repair of LAD) Subjective:  Sedated on vent Day #2 support on [place] ECMO ECMO flows greater than 4 L on minimal pressors Adequate renal function with maintain BUN/creatinine Patient without evidence of stroke or major neurologic problem, responds to stimulus and moves all extremities Nutrition started through gastric tube Heparin being titrated by pharmacy using heparin level  Objective: Vital signs in last 24 hours: Temp:  [97.3 F (36.3 C)-97.9 F (36.6 C)] 97.5 F (36.4 C) (06/04 1704) Pulse Rate:  [78-90] 80 (06/04 1710) Cardiac Rhythm: Atrial paced (06/04 1600) Resp:  [11-22] 16 (06/04 1710) BP: (112-115)/(78-85) 113/81 (06/04 1710) SpO2:  [99 %-100 %] 100 % (06/04 1710) Arterial Line BP: (93-149)/(66-119) 117/83 (06/04 1704) FiO2 (%):  [30 %] 30 % (06/04 1710) Weight:  [95.2 kg] 95.2 kg (06/04 0400)  Hemodynamic parameters for last 24 hours: PAP: (20-32)/(13-25) 26/17 CVP:  [9 mmHg-25 mmHg] 25 mmHg CO:  [6.1 L/min-10.4 L/min] 7.4 L/min CI:  [3.1 L/min/m2-5.2 L/min/m2] 3.8 L/min/m2  Intake/Output from previous day: 06/03 0701 - 06/04 0700 In: 5491.7 [I.V.:3375.9; Blood:617; NG/GT:455.3; IV Piggyback:1043.4] Out: 6659 [Urine:3375; Emesis/NG output:400; Chest Tube:180] Intake/Output this shift: Total I/O In: 2492.8 [I.V.:1452.3; NG/GT:330; IV Piggyback:710.5] Out: 3770 [Urine:3690; Chest Tube:80]       Exam    General-sedated and comfortable    Neck- no JVD, no cervical adenopathy palpable, no carotid bruit   Lungs- clear without rales, wheezes   Cor- regular rate and rhythm, no murmur , gallop   Abdomen- soft, non-tender   Extremities - warm, non-tender, minimal edema   Neuro- oriented, appropriate, no focal  weakness   Lab Results: Recent Labs    10/27/19 0351 10/27/19 0358 10/27/19 1556 10/27/19 1606  WBC 6.2  --  6.1  --   HGB 8.3*   < > 8.5* 8.8*  HCT 24.8*   < > 25.0* 26.0*  PLT 145*  --  149*  --    < > = values in this interval not displayed.   BMET:  Recent Labs    10/27/19 0351 10/27/19 0358 10/27/19 1556 10/27/19 1606  NA 141   < > 140 143  K 3.4*   < > 2.9* 2.9*  CL 107  --  105  --   CO2 25  --  25  --   GLUCOSE 99  --  120*  --   BUN 6  --  6  --   CREATININE 0.81  --  0.80  --   CALCIUM 7.5*  --  7.8*  --    < > = values in this interval not displayed.    PT/INR:  Recent Labs    10/27/19 0351  LABPROT 15.4*  INR 1.3*   ABG    Component Value Date/Time   PHART 7.517 (H) 10/27/2019 1606   HCO3 30.2 (H) 10/27/2019 1606   TCO2 31 10/27/2019 1606   O2SAT 100.0 10/27/2019 1606   CBG (last 3)  Recent Labs    10/27/19 0116 10/27/19 0222 10/27/19 0356  GLUCAP 135* 111* 95    Assessment/Plan: S/P Procedure(s) (LRB): TRANSESOPHAGEAL ECHOCARDIOGRAM (TEE) (N/A) Median Sternotomy. Cannulation For Ecmo (Extracorporeal Membrane Oxygenation) @ 9357 Application Of Wound Vac Chest Exploration for repair of coronary perforation (repair of LAD) Plan to hold heparin for 2 hours and pull femoral A-line tomorrow Plan  to return to the OR Monday morning for wean and possible decannulation of ECMO and chest closure   LOS: 2 days    [name] 10/27/2019

## 2019-10-27 NOTE — Progress Notes (Signed)
ANTICOAGULATION CONSULT NOTE   Pharmacy Consult for IV Heparin Indication: ECMO  No Known Allergies  Patient Measurements: Height: 5' 8.5" (174 cm) Weight: 95.2 kg (209 lb 14.1 oz) IBW/kg (Calculated) : 69.55 Heparin Dosing Weight: 83.9 kg  Vital Signs: Temp: 97.5 F (36.4 C) (06/04 1200) BP: 112/78 (06/04 1145) Pulse Rate: 79 (06/04 1200)  Labs: Recent Labs    10/25/19 1859 10/25/19 2027 10/26/19 0053 10/26/19 0053 10/26/19 0305 10/26/19 0314 10/26/19 0907 10/26/19 1011 10/26/19 1600 10/26/19 1619 10/26/19 2124 10/26/19 2131 10/27/19 0351 10/27/19 0351 10/27/19 0358 10/27/19 0358 10/27/19 0956 10/27/19 1205 10/27/19 1206  HGB 7.8*   < > 8.6*   < > 8.2*   < >  --    < > 8.2*   < > 8.7*   < > 8.3*   < > 7.1*   < > 9.5* 8.8*  --   HCT 23.5*   < > 26.2*   < > 24.4*   < >  --    < > 24.6*   < > 25.7*   < > 24.8*   < > 21.0*  --  28.0* 26.0*  --   PLT 216   < > 234   < > 197   < >  --   --  164  --  153  --  145*  --   --   --   --   --   --   APTT 47*   < > 31  --   --   --   --   --  52*  --   --   --  71*  --   --   --   --   --   --   LABPROT 15.1  --   --   --  14.7  --   --   --   --   --   --   --  15.4*  --   --   --   --   --   --   INR 1.2  --   --   --  1.2  --   --   --   --   --   --   --  1.3*  --   --   --   --   --   --   HEPARINUNFRC  --    < > <0.10*   < > <0.10*   < > <0.10*   < >  --    < > 0.15*  --  0.15*  --   --   --   --   --  0.16*  CREATININE  --    < >  --   --   --   --  0.90  --  0.86  --   --   --  0.81  --   --   --   --   --   --    < > = values in this interval not displayed.    Estimated Creatinine Clearance: 127.3 mL/min (by C-G formula based on SCr of 0.81 mg/dL).   Medical History: Past Medical History:  Diagnosis Date  . Alcohol abuse   . GERD (gastroesophageal reflux disease)   . Tobacco abuse     Assessment: 49 year old male initiated on [place] ECMO.  [place] bol[place] 35[address] units in OR at time of  cannulation Hemoglobin low but improved  7>8.7. Platelets 200.  S/p oozing from iv site > stitched and improved  Previously discussed with Dr. Prescott Gum - Heparin systemically  at 500 units/hr without titration overnight. This am during rounds instructed to increase heparin drip to goal 0.2-0.4.    Heparin level now trending up on 1100 units/h. Good flows, no S/Sx bleeding, Hgb 8.8.  Goal of Therapy:  Heparin Level goal 0.2-0.4 Monitor platelets by anticoagulation protocol: Yes   Plan:  -Will increase heparin to 1200 units/h -Continue q6h heparin level checks until therapeutic  Erin Hearing PharmD., BCPS Clinical Pharmacist 10/27/2019 12:59 PM

## 2019-10-27 NOTE — Progress Notes (Signed)
Unable to reach mother at this time for consent to place PICC. Will attempt again later.

## 2019-10-27 NOTE — Progress Notes (Signed)
Patient ID: William Crawford, male   DOB: 05-20-1971, 49 y.o.   MRN: [id number]     Advanced Heart Failure Rounding Note  PCP-Cardiologist: No primary care provider on file.   Subjective:    - LAD repair + VA ECMO cannulation in OR 6/3. Chest wall open.   Patient did well overnight.  Lactate 0.7, co-ox 83%.  This morning, he is on epinephrine 1, milrinone 0.375, NE 1.5.  Heparin level remains low 0.15. Flow is pulsatile on a-line. I/Os positive with weight up.   LDH 662 => 594 ALT 90 => 72 AST 274 => 150 hgb 8.2 => 8.3 Creatinine 0.99 => 0.81  ECMO circuit functioning normally, no chugging with good flow.   7.39/47/318  Echo (6/3): EF < 20%, severe RV dysfunction  Swan: CVP 11 PA 24/17 Co-ox 83%  ECMO circuit Flow 4.4 L/min  3300 rpm -37 DeltaP 18 Sweep 4  Objective:   Weight Range: 95.2 kg Body mass index is 31.45 kg/m.   Vital Signs:   Temp:  [97.3 F (36.3 C)-97.9 F (36.6 C)] 97.5 F (36.4 C) (06/04 0600) Pulse Rate:  [78-90] 80 (06/04 0600) Resp:  [14-22] 17 (06/04 0600) BP: (94)/(76) 94/76 (06/03 1547) SpO2:  [100 %] 100 % (06/04 0600) FiO2 (%):  [30 %] 30 % (06/04 0330) Weight:  [95.2 kg] 95.2 kg (06/04 0400)    Weight change: Filed Weights   10/25/19 0650 10/27/19 0400  Weight: 83.9 kg 95.2 kg    Intake/Output:   Intake/Output Summary (Last 24 hours) at 10/27/2019 0749 Last data filed at 10/27/2019 0600 Gross per 24 hour  Intake 5491.67 ml  Output 3955 ml  Net 1536.67 ml      Physical Exam    General: Sedated on vent Neck: JVP 10, no thyromegaly or thyroid nodule.  Lungs: Decreased at bases.  CV: Nondisplaced PMI.  Heart regular S1/S2, no S3/S4, no murmur.  Trace ankle edema.   Abdomen: Soft, no hepatosplenomegaly, no distention.  Skin: Intact without lesions or rashes.  Neurologic: Per nursing will awaken and follow commands.   Extremities: No clubbing or cyanosis.  HEENT: Normal.    Telemetry   A-paced in 80 (personally  reviewed)  Labs    CBC Recent Labs    10/26/19 2124 10/26/19 2131 10/27/19 0351 10/27/19 0358  WBC 6.1  --  6.2  --   HGB 8.7*   < > 8.3* 7.1*  HCT 25.7*   < > 24.8* 21.0*  MCV 86.8  --  87.0  --   PLT 153  --  145*  --    < > = values in this interval not displayed.   Basic Metabolic Panel Recent Labs    10/26/19 1600 10/26/19 1619 10/27/19 0351 10/27/19 0358  NA 139   < > 141 143  K 3.8   < > 3.4* 3.4*  CL 107  --  107  --   CO2 26  --  25  --   GLUCOSE 122*  --  99  --   BUN 6  --  6  --   CREATININE 0.86  --  0.81  --   CALCIUM 7.4*  --  7.5*  --   MG 2.1  --  1.9  --   PHOS 2.4*  --  2.1*  --    < > = values in this interval not displayed.   Liver Function Tests Recent Labs    10/26/19 0907 10/27/19 0351  AST  249* 150*  ALT 86* 72*  ALKPHOS 37* 42  BILITOT 1.1 0.8  PROT 4.4* 4.4*  ALBUMIN 2.5* 2.4*   No results for input(s): LIPASE, AMYLASE in the last 72 hours. Cardiac Enzymes No results for input(s): CKTOTAL, CKMB, CKMBINDEX, TROPONINI in the last 72 hours.  BNP: BNP (last 3 results) Recent Labs    10/17/19 0605  BNP 608.8*    ProBNP (last 3 results) No results for input(s): PROBNP in the last 8760 hours.   D-Dimer No results for input(s): DDIMER in the last 72 hours. Hemoglobin A1C No results for input(s): HGBA1C in the last 72 hours. Fasting Lipid Panel No results for input(s): CHOL, HDL, LDLCALC, TRIG, CHOLHDL, LDLDIRECT in the last 72 hours. Thyroid Function Tests No results for input(s): TSH, T4TOTAL, T3FREE, THYROIDAB in the last 72 hours.  Invalid input(s): FREET3  Other results:   Imaging    DG CHEST PORT 1 VIEW  Result Date: 10/26/2019 CLINICAL DATA:  Endotracheally intubated. Adjustment of endotracheal tube. EXAM: PORTABLE CHEST 1 VIEW COMPARISON:  Earlier this day at 0532 hour FINDINGS: The endotracheal tube tip is partially obscured by overlying ECMO catheters, below the level of the clavicular heads. Right internal  jugular Swan-Ganz catheter tip in the region of the main pulmonary outflow tract. Enteric tube tip below the diaphragm in the stomach. ECMO catheters in place. Left chest tube in place. Mild cardiomegaly with unchanged mediastinal contours. No visualized pneumothorax. No pulmonary edema. Minor retrocardiac atelectasis. IMPRESSION: 1. Endotracheal tube tip partially obscured by overlying ECMO catheters, tip below the level of the clavicular heads and above the current. 2. Other support apparatus unchanged. 3. Cardiomegaly is unchanged.  Minor retrocardiac atelectasis. Electronically Signed   By: Keith Rake M.D.   On: 10/26/2019 10:19   ECHOCARDIOGRAM COMPLETE  Result Date: 10/26/2019    ECHOCARDIOGRAM REPORT   Patient Name:   NICHLOS KUNZLER Date of Exam: 10/26/2019 Medical Rec #:  [id number]         Height:       68.5 in Accession #:    [id number]        Weight:       185.0 lb Date of Birth:  04-16-1971         BSA:          1.988 m Patient Age:    45 years          BP:           114/92 mmHg Patient Gender: M                 HR:           112 bpm. Exam Location:  Inpatient Procedure: 2D Echo Indications:    CHF-Acute systolic Q76.19  History:        Patient has prior history of Echocardiogram examinations, most                 recent 10/25/2019. CAD, ECMO; Risk Factors:Current Smoker and                 Alcohol Abuse.  Sonographer:    Mikki Santee RDCS (AE) Referring Phys: [id number] [facility]  Sonographer Comments: No parasternal window, no subcostal window and echo performed with patient supine and on artificial respirator. IMPRESSIONS  1. Left ventricular ejection fraction, by estimation, is <20%. The left ventricle has severely decreased function. The left ventricle demonstrates regional wall motion abnormalities (see scoring diagram/findings for description). The left  ventricular internal cavity size was mildly dilated. Left ventricular diastolic parameters are consistent with Grade II diastolic  dysfunction (pseudonormalization). There is severe akinesis of the left ventricular, mid-apical anteroseptal wall, lateral wall, anterior wall, inferior wall and apical segment.  2. Right ventricular systolic function is severely reduced. The right ventricular size is mildly enlarged.  3. Right atrial size was mildly dilated.  4. The mitral valve is grossly normal. Mild mitral valve regurgitation. No evidence of mitral stenosis.  5. The aortic valve was not well visualized. Aortic valve regurgitation is not visualized. No aortic stenosis is present. FINDINGS  Left Ventricle: Left ventricular ejection fraction, by estimation, is <20%. The left ventricle has severely decreased function. The left ventricle demonstrates regional wall motion abnormalities. Severe akinesis of the left ventricular, mid-apical anteroseptal wall, lateral wall, anterior wall, inferior wall and apical segment. The left ventricular internal cavity size was mildly dilated. There is no left ventricular hypertrophy. Left ventricular diastolic parameters are consistent with Grade II diastolic dysfunction (pseudonormalization). Right Ventricle: The right ventricular size is mildly enlarged. No increase in right ventricular wall thickness. Right ventricular systolic function is severely reduced. Left Atrium: Left atrial size was normal in size. Right Atrium: Right atrial size was mildly dilated. Pericardium: There is no evidence of pericardial effusion. Mitral Valve: The mitral valve is grossly normal. Mild mitral valve regurgitation. No evidence of mitral valve stenosis. Tricuspid Valve: The tricuspid valve is grossly normal. Tricuspid valve regurgitation is mild . No evidence of tricuspid stenosis. Aortic Valve: The aortic valve was not well visualized. Aortic valve regurgitation is not visualized. No aortic stenosis is present. Pulmonic Valve: The pulmonic valve was normal in structure. Pulmonic valve regurgitation is not visualized. No evidence  of pulmonic stenosis. Aorta: The aortic root and ascending aorta are structurally normal, with no evidence of dilitation. IAS/Shunts: The atrial septum is grossly normal.   Diastology LV e' lateral: 1.65 cm/s LV e' medial:  1.88 cm/s  RIGHT VENTRICLE RV S prime:     3.55 cm/s TAPSE (M-mode): 0.7 cm LEFT ATRIUM             Index       RIGHT ATRIUM           Index LA Vol (A2C):   56.0 ml 28.17 ml/m RA Area:     20.40 cm LA Vol (A4C):   48.7 ml 24.50 ml/m RA Volume:   57.20 ml  28.77 ml/m LA Biplane Vol: 53.1 ml 26.71 ml/m  TRICUSPID VALVE TR Peak grad:   6.9 mmHg TR Vmax:        131.00 cm/s Mertie Moores MD Electronically signed by Mertie Moores MD Signature Date/Time: 10/26/2019/12:07:12 PM    Final      Medications:     Scheduled Medications: . sodium chloride   Intravenous Once  . sodium chloride   Intravenous Once  . sodium chloride   Intravenous Once  . acetaminophen  1,000 mg Oral Q6H   Or  . acetaminophen (TYLENOL) oral liquid 160 mg/5 mL  1,000 mg Per Tube Q6H  . acetaminophen (TYLENOL) oral liquid 160 mg/5 mL  650 mg Per Tube Once   Or  . acetaminophen  650 mg Rectal Once  . aspirin  81 mg Per Tube Daily  . bisacodyl  10 mg Oral Daily   Or  . bisacodyl  10 mg Rectal Daily  . chlorhexidine gluconate (MEDLINE KIT)  15 mL Mouth Rinse BID  . Chlorhexidine Gluconate Cloth  6 each  Topical Daily  . clopidogrel  75 mg Per Tube Daily  . docusate  200 mg Per Tube Daily  . feeding supplement (PIVOT 1.5 CAL)  1,000 mL Per Tube Q24H  . folic acid  1 mg Intravenous Daily  . furosemide  40 mg Intravenous BID  . mouth rinse  15 mL Mouth Rinse 10 times per day  . metoCLOPramide (REGLAN) injection  10 mg Intravenous Q8H  . pantoprazole (PROTONIX) IV  40 mg Intravenous QHS  . PHENObarbital  65 mg Intravenous QHS  . sodium chloride flush  3 mL Intravenous Q12H    Infusions: . sodium chloride    . sodium chloride Stopped (10/27/19 0550)  . sodium chloride    . sodium chloride    . sodium  chloride 75 mL/hr at 10/27/19 0600  . albumin human    . ceFEPime (MAXIPIME) IV Stopped (10/27/19 0438)  . dexmedetomidine (PRECEDEX) IV infusion 0.7 mcg/kg/hr (10/27/19 0657)  . dextrose 5 % and 0.45% NaCl    . epinephrine 1 mcg/min (10/27/19 0600)  . heparin 1,050 Units/hr (10/27/19 0600)  . HYDROmorphone 2 mg/hr (10/27/19 0600)  . insulin 0.2 mL/hr at 10/27/19 0600  . lactated ringers    . lactated ringers 10 mL/hr at 10/27/19 0600  . lactated ringers    . midazolam 2 mg/hr (10/27/19 0600)  . milrinone 0.375 mcg/kg/min (10/27/19 0600)  . nitroGLYCERIN    . norepinephrine (LEVOPHED) Adult infusion 1.5 mcg/min (10/27/19 0600)  . phenylephrine (NEO-SYNEPHRINE) Adult infusion    . potassium chloride 10 mEq (10/27/19 0724)  . thiamine injection Stopped (10/26/19 0955)  . vancomycin 1,000 mg (10/27/19 0645)  . vasopressin (PITRESSIN) infusion - *FOR SHOCK* Stopped (10/26/19 1709)    PRN Medications: sodium chloride, albumin human, dextrose, HYDROmorphone, lactated ringers, metoprolol tartrate, midazolam, ondansetron (ZOFRAN) IV, ondansetron (ZOFRAN) IV, sodium chloride flush   Assessment/Plan   1. Cardiogenic shock: Pre-OR echo with EF 45%, apical hypokinesis.  TEE in OR after VF arrest showed severe biventricular dysfunction. Now on ECMO. Currently stable parameters with good flow, pulsatile on arterial line.  Lactate coming down, good co-ox.  He remains on norepinephrine 1.5, epinephrine 1, milrinone 0.375. MAP stable, CVP 11 with I/Os positive. LDH trending down. Creatinine stable. Will plan to rest on ECMO for now, allow LV/RV time to recover from VF arrest/stunning. Echo on 6/3 with EF < 20%, RV severely dysfunctional.  - Heparin to goal heparin level 0.3-0.5, increase today.   - Wean pressors.  - Repeat limited echo.  - Lasix 40 mg IV bid today.  2. Pericardial tamponade: Due to LAD perforation.  Now resolved in OR.  3. CAD: S/p DES to distal RCA on 5/25.  Has known CTO OM.  Had  attempted intervention on CTO LAD today that was complicated by LAD perforation, now s/p LAD repair.  The LAD was not graftable, LAD territory therefore is not perfused except by pre-existing collaterals. Chest remains open. - Continue ASA 81.  - Restart Plavix 75 mg daily.  - Restart statin, Crestor 20.   4. ETOH abuse: Watch for withdrawal, discussed with CCM and will stop phenobarbital/continue Versed.   5. Smoking: Will need to quit.  6. Acute hypoxemic respiratory failure: In setting of cardiac surgery/ECMO.  Vent with FiO2 0.3, sweep 4. Stable ABG.   - Per CCM.  - Vancomycin/cefepime started for ECMO prophylaxis.  7. Neuro: Moves extremities.  8. Nutrition: Trickle tube feeds.  9. VF arrest: In OR.  No further arrhythmias.  -  Off lidocaine.   CRITICAL CARE Performed by: Loralie Champagne  Total critical care time: 40 minutes  Critical care time was exclusive of separately billable procedures and treating other patients.  Critical care was necessary to treat or prevent imminent or life-threatening deterioration.  Critical care was time spent personally by me on the following activities: development of treatment plan with patient and/or surrogate as well as nursing, discussions with consultants, evaluation of patient's response to treatment, examination of patient, obtaining history from patient or surrogate, ordering and performing treatments and interventions, ordering and review of laboratory studies, ordering and review of radiographic studies, pulse oximetry and re-evaluation of patient's condition.   Length of Stay: 2  Loralie Champagne, MD  10/27/2019, 7:49 AM  Advanced Heart Failure Team Pager [phone] (M-F; 7a - 4p)  Please contact [facility] for night-coverage after hours (4p -7a ) and weekends on [email or web address]

## 2019-10-28 ENCOUNTER — Inpatient Hospital Stay (HOSPITAL_COMMUNITY): Payer: BLUE CROSS/BLUE SHIELD

## 2019-10-28 ENCOUNTER — Encounter (HOSPITAL_COMMUNITY): Payer: Self-pay | Admitting: Cardiothoracic Surgery

## 2019-10-28 DIAGNOSIS — F1010 Alcohol abuse, uncomplicated: Secondary | ICD-10-CM

## 2019-10-28 LAB — CBC
HCT: 26.6 % — ABNORMAL LOW (ref 39.0–52.0)
HCT: 27.8 % — ABNORMAL LOW (ref 39.0–52.0)
Hemoglobin: 8.7 g/dL — ABNORMAL LOW (ref 13.0–17.0)
Hemoglobin: 9.2 g/dL — ABNORMAL LOW (ref 13.0–17.0)
MCH: 28.9 pg (ref 26.0–34.0)
MCH: 28.9 pg (ref 26.0–34.0)
MCHC: 32.7 g/dL (ref 30.0–36.0)
MCHC: 33.1 g/dL (ref 30.0–36.0)
MCV: 88.4 fL (ref 80.0–100.0)
MCV: 87.4 fL (ref 80.0–100.0)
Platelets: 137 10*3/uL — ABNORMAL LOW (ref 150–400)
Platelets: 136 10*3/uL — ABNORMAL LOW (ref 150–400)
RBC: 3.01 MIL/uL — ABNORMAL LOW (ref 4.22–5.81)
RBC: 3.18 MIL/uL — ABNORMAL LOW (ref 4.22–5.81)
RDW: 14 % (ref 11.5–15.5)
RDW: 13.9 % (ref 11.5–15.5)
WBC: 5.8 10*3/uL (ref 4.0–10.5)
WBC: 6.5 10*3/uL (ref 4.0–10.5)
nRBC: 0 % (ref 0.0–0.2)
nRBC: 0 % (ref 0.0–0.2)

## 2019-10-28 LAB — BPAM RBC
Blood Product Expiration Date: 202106182359
Blood Product Expiration Date: 202106232359
Blood Product Expiration Date: 202106232359
Blood Product Expiration Date: 202106232359
Blood Product Expiration Date: 202106232359
Blood Product Expiration Date: 202106232359
Blood Product Expiration Date: 202106232359
Blood Product Expiration Date: 202106232359
Blood Product Expiration Date: 202106232359
Blood Product Expiration Date: 202106242359
Blood Product Expiration Date: 202106242359
Blood Product Expiration Date: 202106242359
Blood Product Expiration Date: 202106242359
Blood Product Expiration Date: 202106242359
Blood Product Expiration Date: 202106242359
ISSUE DATE / TIME: 202106021420
ISSUE DATE / TIME: 202106021425
ISSUE DATE / TIME: 202106021425
ISSUE DATE / TIME: 202106021849
ISSUE DATE / TIME: 202106022217
ISSUE DATE / TIME: 202106030512
ISSUE DATE / TIME: 202106030631
ISSUE DATE / TIME: 202106030809
ISSUE DATE / TIME: 202106030851
ISSUE DATE / TIME: 202106031823
ISSUE DATE / TIME: 202106041748
Unit Type and Rh: 6200
Unit Type and Rh: 6200
Unit Type and Rh: 6200
Unit Type and Rh: 6200
Unit Type and Rh: 6200
Unit Type and Rh: 6200
Unit Type and Rh: 6200
Unit Type and Rh: 6200
Unit Type and Rh: 6200
Unit Type and Rh: 6200
Unit Type and Rh: 6200
Unit Type and Rh: 6200
Unit Type and Rh: 6200
Unit Type and Rh: 6200
Unit Type and Rh: 6200

## 2019-10-28 LAB — TYPE AND SCREEN
ABO/RH(D): A POS
Antibody Screen: NEGATIVE
Unit division: 0
Unit division: 0
Unit division: 0
Unit division: 0
Unit division: 0
Unit division: 0
Unit division: 0
Unit division: 0
Unit division: 0
Unit division: 0
Unit division: 0
Unit division: 0
Unit division: 0
Unit division: 0
Unit division: 0

## 2019-10-28 LAB — COMPREHENSIVE METABOLIC PANEL
ALT: 59 U/L — ABNORMAL HIGH (ref 0–44)
AST: 77 U/L — ABNORMAL HIGH (ref 15–41)
Albumin: 2.3 g/dL — ABNORMAL LOW (ref 3.5–5.0)
Alkaline Phosphatase: 49 U/L (ref 38–126)
Anion gap: 9 (ref 5–15)
BUN: 6 mg/dL (ref 6–20)
CO2: 24 mmol/L (ref 22–32)
Calcium: 7.8 mg/dL — ABNORMAL LOW (ref 8.9–10.3)
Chloride: 108 mmol/L (ref 98–111)
Creatinine, Ser: 0.83 mg/dL (ref 0.61–1.24)
GFR calc Af Amer: >60 mL/min (ref >60)
GFR calc non Af Amer: >60 mL/min (ref >60)
Glucose, Bld: 110 mg/dL — ABNORMAL HIGH (ref 70–99)
Potassium: 3.8 mmol/L (ref 3.5–5.1)
Sodium: 141 mmol/L (ref 135–145)
Total Bilirubin: 0.5 mg/dL (ref 0.3–1.2)
Total Protein: 4.9 g/dL — ABNORMAL LOW (ref 6.5–8.1)

## 2019-10-28 LAB — BASIC METABOLIC PANEL
Anion gap: 10 (ref 5–15)
BUN: 5 mg/dL — ABNORMAL LOW (ref 6–20)
CO2: 25 mmol/L (ref 22–32)
Calcium: 8.2 mg/dL — ABNORMAL LOW (ref 8.9–10.3)
Chloride: 104 mmol/L (ref 98–111)
Creatinine, Ser: 0.79 mg/dL (ref 0.61–1.24)
GFR calc Af Amer: >60 mL/min (ref >60)
GFR calc non Af Amer: >60 mL/min (ref >60)
Glucose, Bld: 94 mg/dL (ref 70–99)
Potassium: 3.9 mmol/L (ref 3.5–5.1)
Sodium: 139 mmol/L (ref 135–145)

## 2019-10-28 LAB — POCT I-STAT 7, (LYTES, BLD GAS, ICA,H+H)
Acid-Base Excess: 2 mmol/L (ref 0.0–2.0)
Acid-Base Excess: 3 mmol/L — ABNORMAL HIGH (ref 0.0–2.0)
Acid-Base Excess: 6 mmol/L — ABNORMAL HIGH (ref 0.0–2.0)
Acid-Base Excess: 6 mmol/L — ABNORMAL HIGH (ref 0.0–2.0)
Acid-Base Excess: 6 mmol/L — ABNORMAL HIGH (ref 0.0–2.0)
Bicarbonate: 26.5 mmol/L (ref 20.0–28.0)
Bicarbonate: 28.1 mmol/L — ABNORMAL HIGH (ref 20.0–28.0)
Bicarbonate: 30.3 mmol/L — ABNORMAL HIGH (ref 20.0–28.0)
Bicarbonate: 30.4 mmol/L — ABNORMAL HIGH (ref 20.0–28.0)
Bicarbonate: 30.6 mmol/L — ABNORMAL HIGH (ref 20.0–28.0)
Calcium, Ion: 1.17 mmol/L (ref 1.15–1.40)
Calcium, Ion: 1.17 mmol/L (ref 1.15–1.40)
Calcium, Ion: 1.19 mmol/L (ref 1.15–1.40)
Calcium, Ion: 1.19 mmol/L (ref 1.15–1.40)
Calcium, Ion: 1.2 mmol/L (ref 1.15–1.40)
HCT: 22 % — ABNORMAL LOW (ref 39.0–52.0)
HCT: 23 % — ABNORMAL LOW (ref 39.0–52.0)
HCT: 25 % — ABNORMAL LOW (ref 39.0–52.0)
HCT: 25 % — ABNORMAL LOW (ref 39.0–52.0)
HCT: 27 % — ABNORMAL LOW (ref 39.0–52.0)
Hemoglobin: 7.5 g/dL — ABNORMAL LOW (ref 13.0–17.0)
Hemoglobin: 7.8 g/dL — ABNORMAL LOW (ref 13.0–17.0)
Hemoglobin: 8.5 g/dL — ABNORMAL LOW (ref 13.0–17.0)
Hemoglobin: 8.5 g/dL — ABNORMAL LOW (ref 13.0–17.0)
Hemoglobin: 9.2 g/dL — ABNORMAL LOW (ref 13.0–17.0)
O2 Saturation: 100 %
O2 Saturation: 100 %
O2 Saturation: 100 %
O2 Saturation: 100 %
O2 Saturation: 100 %
Patient temperature: 36.3
Patient temperature: 36.5
Patient temperature: 36.6
Patient temperature: 36.7
Patient temperature: 36.9
Potassium: 3.4 mmol/L — ABNORMAL LOW (ref 3.5–5.1)
Potassium: 3.6 mmol/L (ref 3.5–5.1)
Potassium: 3.7 mmol/L (ref 3.5–5.1)
Potassium: 3.8 mmol/L (ref 3.5–5.1)
Potassium: 3.9 mmol/L (ref 3.5–5.1)
Sodium: 141 mmol/L (ref 135–145)
Sodium: 141 mmol/L (ref 135–145)
Sodium: 141 mmol/L (ref 135–145)
Sodium: 143 mmol/L (ref 135–145)
Sodium: 143 mmol/L (ref 135–145)
TCO2: 28 mmol/L (ref 22–32)
TCO2: 29 mmol/L (ref 22–32)
TCO2: 32 mmol/L (ref 22–32)
TCO2: 32 mmol/L (ref 22–32)
TCO2: 32 mmol/L (ref 22–32)
pCO2 arterial: 39.3 mmHg (ref 32.0–48.0)
pCO2 arterial: 40.3 mmHg (ref 32.0–48.0)
pCO2 arterial: 40.4 mmHg (ref 32.0–48.0)
pCO2 arterial: 40.8 mmHg (ref 32.0–48.0)
pCO2 arterial: 41.1 mmHg (ref 32.0–48.0)
pH, Arterial: 7.435 (ref 7.350–7.450)
pH, Arterial: 7.441 (ref 7.350–7.450)
pH, Arterial: 7.48 — ABNORMAL HIGH (ref 7.350–7.450)
pH, Arterial: 7.483 — ABNORMAL HIGH (ref 7.350–7.450)
pH, Arterial: 7.486 — ABNORMAL HIGH (ref 7.350–7.450)
pO2, Arterial: 325 mmHg — ABNORMAL HIGH (ref 83.0–108.0)
pO2, Arterial: 336 mmHg — ABNORMAL HIGH (ref 83.0–108.0)
pO2, Arterial: 350 mmHg — ABNORMAL HIGH (ref 83.0–108.0)
pO2, Arterial: 361 mmHg — ABNORMAL HIGH (ref 83.0–108.0)
pO2, Arterial: 409 mmHg — ABNORMAL HIGH (ref 83.0–108.0)

## 2019-10-28 LAB — PHOSPHORUS: Phosphorus: 3.3 mg/dL (ref 2.5–4.6)

## 2019-10-28 LAB — APTT
aPTT: 74 seconds — ABNORMAL HIGH (ref 24–36)
aPTT: 78 seconds — ABNORMAL HIGH (ref 24–36)

## 2019-10-28 LAB — HEPARIN LEVEL (UNFRACTIONATED)
Heparin Unfractionated: 0.14 IU/mL — ABNORMAL LOW (ref 0.30–0.70)
Heparin Unfractionated: 0.15 IU/mL — ABNORMAL LOW (ref 0.30–0.70)
Heparin Unfractionated: 0.26 IU/mL — ABNORMAL LOW (ref 0.30–0.70)
Heparin Unfractionated: 0.19 IU/mL — ABNORMAL LOW (ref 0.30–0.70)

## 2019-10-28 LAB — VANCOMYCIN, TROUGH: Vancomycin Tr: 16 ug/mL (ref 15–20)

## 2019-10-28 LAB — TROPONIN I (HIGH SENSITIVITY): Troponin I (High Sensitivity): 14199 ng/L (ref <18)

## 2019-10-28 LAB — PROTIME-INR
INR: 1.2 (ref 0.8–1.2)
Prothrombin Time: 14.6 seconds (ref 11.4–15.2)

## 2019-10-28 LAB — LACTIC ACID, PLASMA: Lactic Acid, Venous: 0.8 mmol/L (ref 0.5–1.9)

## 2019-10-28 LAB — LACTATE DEHYDROGENASE: LDH: 548 U/L — ABNORMAL HIGH (ref 98–192)

## 2019-10-28 LAB — GLUCOSE, CAPILLARY
Glucose-Capillary: 100 mg/dL — ABNORMAL HIGH (ref 70–99)
Glucose-Capillary: 104 mg/dL — ABNORMAL HIGH (ref 70–99)
Glucose-Capillary: 104 mg/dL — ABNORMAL HIGH (ref 70–99)
Glucose-Capillary: 122 mg/dL — ABNORMAL HIGH (ref 70–99)
Glucose-Capillary: 124 mg/dL — ABNORMAL HIGH (ref 70–99)
Glucose-Capillary: 125 mg/dL — ABNORMAL HIGH (ref 70–99)
Glucose-Capillary: 131 mg/dL — ABNORMAL HIGH (ref 70–99)
Glucose-Capillary: 83 mg/dL (ref 70–99)
Glucose-Capillary: 87 mg/dL (ref 70–99)
Glucose-Capillary: 87 mg/dL (ref 70–99)
Glucose-Capillary: 88 mg/dL (ref 70–99)
Glucose-Capillary: 88 mg/dL (ref 70–99)
Glucose-Capillary: 93 mg/dL (ref 70–99)
Glucose-Capillary: 97 mg/dL (ref 70–99)

## 2019-10-28 LAB — COOXEMETRY PANEL
Carboxyhemoglobin: 1.2 % (ref 0.5–1.5)
Methemoglobin: 0.9 % (ref 0.0–1.5)
O2 Saturation: 76.7 %
Total hemoglobin: 8.6 g/dL — ABNORMAL LOW (ref 12.0–16.0)

## 2019-10-28 LAB — FIBRINOGEN: Fibrinogen: >800 mg/dL — ABNORMAL HIGH (ref 210–475)

## 2019-10-28 LAB — MAGNESIUM: Magnesium: 2.3 mg/dL (ref 1.7–2.4)

## 2019-10-28 MED ORDER — POTASSIUM CHLORIDE 20 MEQ/15ML (10%) PO SOLN
40.0000 meq | Freq: Once | ORAL | Status: AC
  Administered 2019-10-28: 40 meq
  Filled 2019-10-28: qty 30

## 2019-10-28 MED ORDER — ATROPINE SULFATE 1 MG/10ML IJ SOSY
PREFILLED_SYRINGE | INTRAMUSCULAR | Status: AC
  Filled 2019-10-28: qty 10

## 2019-10-28 MED ORDER — FUROSEMIDE 10 MG/ML IJ SOLN
40.0000 mg | Freq: Two times a day (BID) | INTRAMUSCULAR | Status: DC
  Administered 2019-10-28 (×2): 40 mg via INTRAVENOUS
  Filled 2019-10-28 (×2): qty 4

## 2019-10-28 MED ORDER — ROCURONIUM BROMIDE 50 MG/5ML IV SOLN: 100.0000 mg | Freq: Once | INTRAVENOUS | Status: DC

## 2019-10-28 MED ORDER — FENTANYL 2500MCG IN NS 250ML (10MCG/ML) PREMIX INFUSION
0.0000 ug/h | INTRAVENOUS | Status: DC
  Administered 2019-10-28: 50 ug/h via INTRAVENOUS
  Filled 2019-10-28 (×2): qty 250

## 2019-10-28 MED ORDER — SODIUM CHLORIDE 0.9 % IV SOLN
INTRAVENOUS | Status: DC | PRN
  Administered 2019-10-28 – 2019-10-29 (×2): 250 mL via INTRAVENOUS
  Administered 2019-10-30 (×2): 500 mL via INTRAVENOUS
  Administered 2019-11-03: 250 mL via INTRAVENOUS

## 2019-10-28 MED ORDER — KETOTIFEN FUMARATE 0.025 % OP SOLN
1.0000 [drp] | Freq: Two times a day (BID) | OPHTHALMIC | Status: DC
  Administered 2019-10-28 – 2019-11-11 (×26): 1 [drp] via OPHTHALMIC
  Filled 2019-10-28 (×3): qty 5

## 2019-10-28 MED ORDER — ATORVASTATIN CALCIUM 40 MG PO TABS
40.0000 mg | ORAL_TABLET | Freq: Every day | ORAL | Status: DC
  Administered 2019-10-28 – 2019-11-05 (×7): 40 mg
  Filled 2019-10-28 (×7): qty 1

## 2019-10-28 MED ORDER — POTASSIUM CHLORIDE 20 MEQ/15ML (10%) PO SOLN
40.0000 meq | Freq: Two times a day (BID) | ORAL | Status: DC
  Administered 2019-10-28 (×2): 40 meq
  Filled 2019-10-28 (×3): qty 30

## 2019-10-28 MED ORDER — ROCURONIUM BROMIDE 10 MG/ML (PF) SYRINGE
PREFILLED_SYRINGE | INTRAVENOUS | Status: AC
  Administered 2019-10-28: 70 mg
  Filled 2019-10-28: qty 10

## 2019-10-28 MED ORDER — INSULIN ASPART 100 UNIT/ML ~~LOC~~ SOLN: 2.0000 [IU] | SUBCUTANEOUS | Status: DC

## 2019-10-28 MED ORDER — ROCURONIUM BROMIDE 50 MG/5ML IV SOLN
100.0000 mg | INTRAVENOUS | Status: DC | PRN
  Filled 2019-10-28: qty 10

## 2019-10-28 NOTE — Progress Notes (Signed)
3 Days Post-Op Procedure(s) (LRB): TRANSESOPHAGEAL ECHOCARDIOGRAM (TEE) (N/A) Median Sternotomy. Cannulation For Ecmo (Extracorporeal Membrane Oxygenation) @ 3086 Application Of Wound Vac Chest Exploration for repair of coronary perforation (repair of LAD) Subjective: Awake on vent, ECMO with chest open Hemodynamics stable on low dose epi but echo with with severe biventricular dysfunction  Objective: Vital signs in last 24 hours: Temp:  [97 F (36.1 C)-97.7 F (36.5 C)] 97.7 F (36.5 C) (06/05 0945) Pulse Rate:  [59-87] 72 (06/05 0915) Cardiac Rhythm: Atrial paced (06/05 0900) Resp:  [11-25] 15 (06/05 0945) BP: (89-113)/(69-81) 89/69 (06/05 0849) SpO2:  [88 %-100 %] 93 % (06/05 0915) Arterial Line BP: (77-149)/(64-119) 102/78 (06/05 0945) FiO2 (%):  [30 %] 30 % (06/05 0849) Weight:  [91 kg] 91 kg (06/05 0400)  Hemodynamic parameters for last 24 hours: PAP: (18-32)/(13-25) 22/15 CVP:  [8 mmHg-25 mmHg] 10 mmHg CO:  [6.1 L/min-7.4 L/min] 7.4 L/min CI:  [3.1 L/min/m2-3.8 L/min/m2] 3.8 L/min/m2  Intake/Output from previous day: 06/04 0701 - 06/05 0700 In: 5683.8 [I.V.:3183.2; NG/GT:880; IV Piggyback:1620.5] Out: 7480 [Urine:7320; Chest Tube:160] Intake/Output this shift: Total I/O In: 413 [I.V.:225; NG/GT:188] Out: 935 [Urine:235; Emesis/NG output:700]  EXAM       Exam    General- intubated sedated    Neck- no JVD, no cervical adenopathy palpable, no carotid bruit   Lungs- clear without rales, wheezes   Cor- regular rate and rhythm, no murmur , gallop   Abdomen- soft, non-tender   Extremities - warm, non-tender, minimal edema   Neuro- oriented, appropriate, no focal weakness   Lab Results: Recent Labs    10/27/19 1556 10/27/19 1606 10/28/19 0408 10/28/19 0408 10/28/19 0415 10/28/19 0748  WBC 6.1  --  5.8  --   --   --   HGB 8.5*   < > 8.7*   < > 7.8* 7.5*  HCT 25.0*   < > 26.6*   < > 23.0* 22.0*  PLT 149*  --  137*  --   --   --    < > = values in this  interval not displayed.   BMET:  Recent Labs    10/27/19 1556 10/27/19 1606 10/28/19 0408 10/28/19 0408 10/28/19 0415 10/28/19 0748  NA 140   < > 141   < > 143 143  K 2.9*   < > 3.8   < > 3.9 3.4*  CL 105  --  108  --   --   --   CO2 25  --  24  --   --   --   GLUCOSE 120*  --  110*  --   --   --   BUN 6  --  6  --   --   --   CREATININE 0.80  --  0.83  --   --   --   CALCIUM 7.8*  --  7.8*  --   --   --    < > = values in this interval not displayed.    PT/INR:  Recent Labs    10/28/19 0408  LABPROT 14.6  INR 1.2   ABG    Component Value Date/Time   PHART 7.435 10/28/2019 0748   HCO3 26.5 10/28/2019 0748   TCO2 28 10/28/2019 0748   O2SAT 100.0 10/28/2019 0748   CBG (last 3)  Recent Labs    10/28/19 0703 10/28/19 0746 10/28/19 0927  GLUCAP 124* 97 83    Assessment/Plan: S/P Procedure(s) (LRB): TRANSESOPHAGEAL ECHOCARDIOGRAM (TEE) (N/A)  Median Sternotomy. Cannulation For Ecmo (Extracorporeal Membrane Oxygenation) @ 5176 Application Of Wound Vac Chest Exploration for repair of coronary perforation (repair of LAD) Continue VA ECMO support after cardiac arrest from LAD perforation, tamponade Minimal movement since chest is open Cont tube feeds, antibiotics, heparin   LOS: 3 days    [name] 10/28/2019

## 2019-10-28 NOTE — Progress Notes (Signed)
Pt continues to have audible cuff leak.  ETT was originally secured at 25 at the lips.  ETT is at 23 at the lips. RT advanced ETT 2cm.  ETT secured at 25 at the lips. No audible cuff leak leak noted.  Pt tolerated well and getting good volumes. Will continue to monitor.

## 2019-10-28 NOTE — Progress Notes (Signed)
Patient ID: William Crawford, male   DOB: [birth year], 49 y.o.   MRN: [id number]     Advanced Heart Failure Rounding Note  PCP-Cardiologist: No primary care provider on file.   Subjective:    - LAD repair + VA ECMO cannulation in OR 6/3. Chest wall open.  Patient did well overnight.  Lactate 0.8, co-ox 77%.  This morning, he is on epinephrine 1, milrinone 0.375, off NE.  Heparin level remains low. Flow is pulsatile on a-line. I/Os negative with Lasix 40 mg IV bid.   LDH 662 => 594 => 548 ALT 90 => 72 AST 274 => 150 hgb 8.2 => 8.3 => 8.7 Creatinine 0.99 => 0.81 => 0.83  ECMO circuit functioning normally, no chugging with good flow.   7.43/39/336  Echo (6/3): EF < 20%, severe RV dysfunction  Swan: CVP 10-12 PA 25/13 Co-ox 77%  ECMO circuit Flow 4.3 L/min  3300 rpm -36 DeltaP 18 Sweep 4  Objective:   Weight Range: 91 kg Body mass index is 30.06 kg/m.   Vital Signs:   Temp:  [97 F (36.1 C)-97.7 F (36.5 C)] 97.2 F (36.2 C) (06/05 0830) Pulse Rate:  [59-87] 79 (06/05 0830) Resp:  [11-25] 16 (06/05 0830) BP: (93-113)/(70-81) 102/71 (06/05 0400) SpO2:  [88 %-100 %] 100 % (06/05 0830) Arterial Line BP: (77-149)/(64-119) 93/71 (06/05 0830) FiO2 (%):  [30 %] 30 % (06/05 0800) Weight:  [91 kg] 91 kg (06/05 0400) Last BM Date: (PTA)  Weight change: Filed Weights   10/25/19 0650 10/27/19 0400 10/28/19 0400  Weight: 83.9 kg 95.2 kg 91 kg    Intake/Output:   Intake/Output Summary (Last 24 hours) at 10/28/2019 0838 Last data filed at 10/28/2019 0800 Gross per 24 hour  Intake 5310.4 ml  Output 7950 ml  Net -2639.6 ml      Physical Exam    General: Sedated on vent Neck: JVP 8 cm, no thyromegaly or thyroid nodule.  Lungs: Decreased at bases.  CV: Nondisplaced PMI.  Heart regular S1/S2, no S3/S4, no murmur.  No peripheral edema.  Abdomen: Soft, no hepatosplenomegaly, no distention.  Skin: Intact without lesions or rashes.  Neurologic: Will awaken per nursing.   Extremities: No clubbing or cyanosis.  HEENT: Normal.    Telemetry   A-paced in 80s (personally reviewed)  Labs    CBC Recent Labs    10/27/19 1556 10/27/19 1606 10/28/19 0408 10/28/19 0408 10/28/19 0415 10/28/19 0748  WBC 6.1  --  5.8  --   --   --   HGB 8.5*   < > 8.7*   < > 7.8* 7.5*  HCT 25.0*   < > 26.6*   < > 23.0* 22.0*  MCV 85.9  --  88.4  --   --   --   PLT 149*  --  137*  --   --   --    < > = values in this interval not displayed.   Basic Metabolic Panel Recent Labs    10/27/19 1556 10/27/19 1606 10/28/19 0408 10/28/19 0408 10/28/19 0415 10/28/19 0748  NA 140   < > 141   < > 143 143  K 2.9*   < > 3.8   < > 3.9 3.4*  CL 105  --  108  --   --   --   CO2 25  --  24  --   --   --   GLUCOSE 120*  --  110*  --   --   --  BUN 6  --  6  --   --   --   CREATININE 0.80  --  0.83  --   --   --   CALCIUM 7.8*  --  7.8*  --   --   --   MG 2.0  --  2.3  --   --   --   PHOS 1.1*  --  3.3  --   --   --    < > = values in this interval not displayed.   Liver Function Tests Recent Labs    10/27/19 0351 10/28/19 0408  AST 150* 77*  ALT 72* 59*  ALKPHOS 42 49  BILITOT 0.8 0.5  PROT 4.4* 4.9*  ALBUMIN 2.4* 2.3*   No results for input(s): LIPASE, AMYLASE in the last 72 hours. Cardiac Enzymes No results for input(s): CKTOTAL, CKMB, CKMBINDEX, TROPONINI in the last 72 hours.  BNP: BNP (last 3 results) Recent Labs    10/17/19 0605  BNP 608.8*    ProBNP (last 3 results) No results for input(s): PROBNP in the last 8760 hours.   D-Dimer No results for input(s): DDIMER in the last 72 hours. Hemoglobin A1C No results for input(s): HGBA1C in the last 72 hours. Fasting Lipid Panel No results for input(s): CHOL, HDL, LDLCALC, TRIG, CHOLHDL, LDLDIRECT in the last 72 hours. Thyroid Function Tests No results for input(s): TSH, T4TOTAL, T3FREE, THYROIDAB in the last 72 hours.  Invalid input(s): FREET3  Other results:   Imaging    DG Chest Port 1  View  Result Date: 10/28/2019 CLINICAL DATA:  Follow-up exam. EXAM: PORTABLE CHEST 1 VIEW COMPARISON:  10/27/2019 and older exams. FINDINGS: Cardiac ECMO catheters are stable as are the left-sided chest tube, endotracheal tube, Swan-Ganz catheter and right-sided PICC. Cardiac silhouette is normal in size. No mediastinal or hilar masses. Clear lungs. IMPRESSION: 1. Stable appearance from the most recent prior study. Lungs remain clear. 2. No change in the support apparatus Electronically Signed   By: Lajean Manes M.D.   On: 10/28/2019 08:35     Medications:     Scheduled Medications: . sodium chloride   Intravenous Once  . sodium chloride   Intravenous Once  . sodium chloride   Intravenous Once  . acetaminophen  1,000 mg Oral Q6H   Or  . acetaminophen (TYLENOL) oral liquid 160 mg/5 mL  1,000 mg Per Tube Q6H  . acetaminophen (TYLENOL) oral liquid 160 mg/5 mL  650 mg Per Tube Once   Or  . acetaminophen  650 mg Rectal Once  . aspirin  81 mg Per Tube Daily  . bisacodyl  10 mg Oral Daily   Or  . bisacodyl  10 mg Rectal Daily  . chlorhexidine gluconate (MEDLINE KIT)  15 mL Mouth Rinse BID  . Chlorhexidine Gluconate Cloth  6 each Topical Daily  . clopidogrel  75 mg Per Tube Daily  . docusate  200 mg Per Tube Daily  . feeding supplement (PIVOT 1.5 CAL)  1,000 mL Per Tube Q24H  . folic acid  1 mg Intravenous Daily  . furosemide  40 mg Intravenous Q12H  . mouth rinse  15 mL Mouth Rinse 10 times per day  . metoCLOPramide (REGLAN) injection  10 mg Intravenous Q8H  . pantoprazole (PROTONIX) IV  40 mg Intravenous QHS  . potassium chloride  40 mEq Per Tube BID  . sodium chloride flush  10-40 mL Intracatheter Q12H  . sodium chloride flush  3 mL Intravenous Q12H  Infusions: . sodium chloride    . sodium chloride Stopped (10/28/19 0227)  . sodium chloride    . sodium chloride 10 mL/hr at 10/28/19 0500  . sodium chloride 10 mL/hr at 10/28/19 0800  . ceFEPime (MAXIPIME) IV Stopped (10/28/19  0509)  . dexmedetomidine (PRECEDEX) IV infusion 0.7 mcg/kg/hr (10/28/19 0818)  . epinephrine 1 mcg/min (10/28/19 0800)  . heparin 1,500 Units/hr (10/28/19 0834)  . HYDROmorphone 2 mg/hr (10/28/19 0800)  . lactated ringers Stopped (10/28/19 0624)  . lactated ringers    . midazolam 3 mg/hr (10/28/19 0800)  . milrinone 0.375 mcg/kg/min (10/28/19 0800)  . nitroGLYCERIN    . norepinephrine (LEVOPHED) Adult infusion 0 mcg/min (10/28/19 0626)  . phenylephrine (NEO-SYNEPHRINE) Adult infusion    . thiamine injection Stopped (10/27/19 1126)  . vancomycin Stopped (10/28/19 8032)  . vasopressin (PITRESSIN) infusion - *FOR SHOCK* Stopped (10/26/19 1709)    PRN Medications: sodium chloride, sodium chloride, HYDROmorphone, metoprolol tartrate, midazolam, ondansetron (ZOFRAN) IV, ondansetron (ZOFRAN) IV, sodium chloride flush, sodium chloride flush   Assessment/Plan   1. Cardiogenic shock: Pre-OR echo with EF 45%, apical hypokinesis.  TEE in OR after VF arrest showed severe biventricular dysfunction. Now on ECMO. Currently stable parameters with good flow, pulsatile on arterial line.  Lactate coming down, good co-ox.  He remains on epinephrine 1, milrinone 0.375. MAP stable, CVP 10-12 with I/Os negative on IV Lasix. LDH trending down. Creatinine stable. Will plan to rest on ECMO for now, allow LV/RV time to recover from VF arrest/stunning. Echo on 6/3 with EF < 20%, RV severely dysfunctional.  - Heparin to goal heparin level 0.3-0.5, increase today.   - Wean epinephrine as tolerated.  - Repeat limited echo tomorrow.  - Lasix 40 mg IV bid again today.  2. Pericardial tamponade: Due to LAD perforation.  Now resolved in OR.  3. CAD: S/p DES to distal RCA on 5/25.  Has known CTO OM.  Had attempted intervention on CTO LAD today that was complicated by LAD perforation, now s/p LAD repair.  The LAD was not graftable, LAD territory therefore is not perfused except by pre-existing collaterals. Chest remains  open. - Continue ASA 81.  - Continue Plavix 75 mg daily.  - Continue statin, Crestor 20.   4. ETOH abuse: Watch for withdrawal.   5. Smoking: Will need to quit.  6. Acute hypoxemic respiratory failure: In setting of cardiac surgery/ECMO.  Vent with FiO2 0.3, sweep 4. Stable ABG.   - Per CCM.  - Vancomycin/cefepime started for ECMO prophylaxis.  7. Neuro: Moves extremities.  8. Nutrition: Trickle tube feeds.  9. VF arrest: In OR.  No further arrhythmias.  - Off lidocaine.   CRITICAL CARE Performed by: Loralie Champagne  Total critical care time: 40 minutes  Critical care time was exclusive of separately billable procedures and treating other patients.  Critical care was necessary to treat or prevent imminent or life-threatening deterioration.  Critical care was time spent personally by me on the following activities: development of treatment plan with patient and/or surrogate as well as nursing, discussions with consultants, evaluation of patient's response to treatment, examination of patient, obtaining history from patient or surrogate, ordering and performing treatments and interventions, ordering and review of laboratory studies, ordering and review of radiographic studies, pulse oximetry and re-evaluation of patient's condition.   Length of Stay: 3  Loralie Champagne, MD  10/28/2019, 8:38 AM  Advanced Heart Failure Team Pager [phone] (M-F; 7a - 4p)  Please contact [facility] for night-coverage  after hours (4p -7a ) and weekends on [email or web address]

## 2019-10-28 NOTE — Progress Notes (Signed)
NAME:  William Crawford, MRN:  [id number], DOB:  09/05/1970, LOS: 3 ADMISSION DATE:  10/25/2019, CONSULTATION DATE:  10/25/19 REFERRING MD:  [place], CHIEF COMPLAINT:  Heart failure  Brief History   49 yo male admitted to ICU on 10/25/19 following CTO PCI to LAD at which time he unfortunately developed a perforated LAD and went into vfib arrest. He subsequently underwent emergent repair however was unable to be removed from bypass following the procedure. PCCM consulted for ECMO.  History of present illness   Pt initially presented with unstable angina on 5/25 at which time he underwent a heart cath which revealed occlusive disease involving the RCA as well as some chronic occlusions involving the first OM branch and proximal LAD. DES placed to the RCA lesion. He was subsequently discharged on DAPT and returned today for stent placement to the LAD.  He, unfortunately, developed a large perforation to the LAD during the procedure subsequently undergoing vfib arrest (time to ROSC 54mn) and was taken for emergent repair.  Past Medical History  Tobacco use, alcohol use, HFrEF, CAD s/p DES placement 5/25  Significant Hospital Events   6/2 LAD perforation during DES placement>Vfib arrest (14 min)>emergent repair>unable to wean bypass>PCCM consulted for ECMO  Consults:  PCCM Cardiology CT surgery  Procedures:  6/2 PCI 6/2 LAD repair 6/2 ECMO  ETT 6/2>> Central line 6/2>>  Significant Diagnostic Tests:  5/25 echocardiogram>>LVEF 35-40% with regional wall motion abnormalities. Severe inferolateral hypokinesis; G1DD. No valvular disease  Micro Data:  n/a  Antimicrobials:  n/a   Interim history/subjective:  No acute events overnight.  High residual tube feeds-currently held.  Objective   Blood pressure 102/71, pulse 79, temperature (!) 97 F (36.1 C), resp. rate 16, height 5' 8.5" (1.74 m), weight 91 kg, SpO2 100 %. PAP: (18-32)/(13-25) 22/16 CVP:  [8 mmHg-25 mmHg] 12 mmHg CO:  [6.1  L/min-7.4 L/min] 7.4 L/min CI:  [3.1 L/min/m2-3.8 L/min/m2] 3.8 L/min/m2  Vent Mode: PRVC FiO2 (%):  [30 %] 30 % Set Rate:  [10 bmp] 10 bmp Vt Set:  [480 mL-556 mL] 480 mL PEEP:  [10 cmH20] 10 cmH20 Plateau Pressure:  [20 cmH20-25 cmH20] 23 cmH20   Intake/Output Summary (Last 24 hours) at 10/28/2019 0747 Last data filed at 10/28/2019 0700 Gross per 24 hour  Intake 5683.76 ml  Output 7480 ml  Net -1796.24 ml   Filed Weights   10/25/19 0650 10/27/19 0400 10/28/19 0400  Weight: 83.9 kg 95.2 kg 91 kg    Examination: General: Critically ill-appearing man lying in bed intubated, sedated, on ECMO HENT: [place]/AT, eyes anicteric.  Oral mucosa moist, intubated. Lungs: Clear to auscultation bilaterally, thin clear tracheal secretions.  Breathing over the vent. Cardiovascular: Midsternotomy, open chest, centrally cannulated recommend.  Paced rhythm. Abdomen: Soft, nontender, nondistended, hypoactive bowel sounds. Extremities: Warm and dry, no clubbing or cyanosis.  Right radial A-line, right femoral arterial sheath.   Neuro: Sedated, RASS -5.  Attempts to open his eyes to stimulation, but not able to do this fully, not following commands. GU: Foley catheter draining clear yellow urine  VA ecmo 4.4L/min, RMP 3300, sweep 4L delta P 19 P ven -38  Vent PRVC 10/40/10/30 %, P plat 24  Resolved Hospital Problem list   Pericardial tamponade--2/2 LAD perforation  Assessment & Plan:   Cardiogenic shock. EF 45% prior to OR. Post Vfib arrest echo suggestive of biventricular failure. Decompensated heart failure s/p thoracotomy for repair of perforation of LAD during PCI. Unable to be weaned from bypass  in OR. On V-A ECMO since 6/2. Mild stable hemolysis, no signs of circuit clotting. Improved pulsatility this morning. Backing down on pressor/iatrogenic support Post operative anemia. hgb 7.0 post op. Hgb 8.2 this morning Plan -Appreciate cardiology and CTS's management -Continue to wean inotropes &  pressors--low-dose epi, midodrine only currently -Continue heparin per pharmacy, continue monitoring LDH. -Continue Swan-Ganz for invasive monitoring -Lasix 40 mg IV twice daily -ECMO protocol -DAPT and starting statin  ICM, acute HFrEF,  CAD, h/o HTN.  NSTEMI s/p DES to RCA 5/25.  Cardiogenic shock Plan -Continue holding Bblocker, ACE/ ARB due to shock -Continue inotropes and vasopressors plus VA ECMO for mechanical support -Strict I/O -Continue Swan-Ganz for invasive monitoring -Repeat echo tomorrow -Continue telemetry monitoring -Diuresis -Starting statin today with improvement in LFTs; continue to monitor LFTs  Acute hypoxic respiratory failure requiring mechanical ventilation.  Tobacco use--1ppd smoker CXR this morning showing ETT around level of corina--pulled back. Plan: continue PSV.  daily SBTs/WUA. VAP bundle. Repeat CXR to evaluate  -Continue low tidal volume ventilation, 4-8 cc/kg ideal body weight with goal plateau less than 30 driving pressure less than 15.  At goal.. -Back prevention protocol -Sedation is required to tolerate mechanical ventilation echo.  Goal RASS -3 -4.  Will decrease Versed today.  High risk alcohol use Risk for ETOH w/d Plan:  -Continue Versed as required -Supplemental vitamins  High risk malnutrition  -We will restart tube feed at lower rate this afternoon  Hypokalemia Hypophosphatemia-resolved -Continue to monitor  Elevated transaminases likely due to cardiac failure-improving -Continue to monitor  Best practice:  Diet: NPO. Tube feeds Pain/Anxiety/Delirium protocol (if indicated): dilaudid, precedex, versed VAP protocol (if indicated): yes DVT prophylaxis: heparin gtt GI prophylaxis: pepcid Glucose control: per primary Mobility: BR Code Status: Full Family Communication: mother updated at bedside 6/4 Disposition: ICU  Labs   CBC: Recent Labs  Lab 10/26/19 1600 10/26/19 1619 10/26/19 2124 10/26/19 2131 10/27/19 0351  10/27/19 0358 10/27/19 1556 10/27/19 1556 10/27/19 1606 10/27/19 1812 10/27/19 2213 10/28/19 0408 10/28/19 0415  WBC 6.1  --  6.1  --  6.2  --  6.1  --   --   --   --  5.8  --   HGB 8.2*   < > 8.7*   < > 8.3*   < > 8.5*   < > 8.8* 8.8* 7.8* 8.7* 7.8*  HCT 24.6*   < > 25.7*   < > 24.8*   < > 25.0*   < > 26.0* 26.0* 23.0* 26.6* 23.0*  MCV 87.2  --  86.8  --  87.0  --  85.9  --   --   --   --  88.4  --   PLT 164  --  153  --  145*  --  149*  --   --   --   --  137*  --    < > = values in this interval not displayed.    Basic Metabolic Panel: Recent Labs  Lab 10/26/19 0305 10/26/19 0314 10/26/19 0907 10/26/19 1011 10/26/19 1600 10/26/19 1619 10/27/19 0351 10/27/19 0358 10/27/19 1556 10/27/19 1556 10/27/19 1606 10/27/19 1812 10/27/19 2213 10/28/19 0408 10/28/19 0415  NA  --    < > 141   < > 139   < > 141   < > 140   < > 143 144 143 141 143  K  --    < > 4.1   < > 3.8   < > 3.4*   < >  2.9*   < > 2.9* 2.7* 3.6 3.8 3.9  CL  --   --  108  --  107  --  107  --  105  --   --   --   --  108  --   CO2  --   --  26  --  26  --  25  --  25  --   --   --   --  24  --   GLUCOSE  --   --  117*  --  122*  --  99  --  120*  --   --   --   --  110*  --   BUN  --   --  8  --  6  --  6  --  6  --   --   --   --  6  --   CREATININE  --   --  0.90  --  0.86  --  0.81  --  0.80  --   --   --   --  0.83  --   CALCIUM  --   --  7.3*  --  7.4*  --  7.5*  --  7.8*  --   --   --   --  7.8*  --   MG 2.2  --   --   --  2.1  --  1.9  --  2.0  --   --   --   --  2.3  --   PHOS  --   --   --   --  2.4*  --  2.1*  --  1.1*  --   --   --   --  3.3  --    < > = values in this interval not displayed.   GFR: Estimated Creatinine Clearance: 121.7 mL/min (by C-G formula based on SCr of 0.83 mg/dL). Recent Labs  Lab 10/25/19 2027 10/26/19 0053 10/26/19 0305 10/26/19 1600 10/26/19 2124 10/27/19 0351 10/27/19 1556 10/28/19 0408  WBC  --    < > 5.8   < > 6.1 6.2 6.1 5.8  LATICACIDVEN 3.2*  --  2.5*  --    --  0.7  --  0.8   < > = values in this interval not displayed.    Liver Function Tests: Recent Labs  Lab 10/25/19 2027 10/26/19 0305 10/26/19 0907 10/27/19 0351 10/28/19 0408  AST 222* 274* 249* 150* 77*  ALT 87* 90* 86* 72* 59*  ALKPHOS 38 35* 37* 42 49  BILITOT 1.3* 1.1 1.1 0.8 0.5  PROT 4.6* 4.6* 4.4* 4.4* 4.9*  ALBUMIN 2.7* 2.7* 2.5* 2.4* 2.3*   No results for input(s): LIPASE, AMYLASE in the last 168 hours. No results for input(s): AMMONIA in the last 168 hours.  ABG    Component Value Date/Time   PHART 7.441 10/28/2019 0415   PCO2ART 41.1 10/28/2019 0415   PO2ART 350 (H) 10/28/2019 0415   HCO3 28.1 (H) 10/28/2019 0415   TCO2 29 10/28/2019 0415   O2SAT 76.7 10/28/2019 0416     Coagulation Profile: Recent Labs  Lab 10/25/19 1859 10/26/19 0305 10/27/19 0351 10/28/19 0408  INR 1.2 1.2 1.3* 1.2    Cardiac Enzymes: No results for input(s): CKTOTAL, CKMB, CKMBINDEX, TROPONINI in the last 168 hours.  HbA1C: Hgb A1c MFr Bld  Date/Time Value Ref Range Status  10/18/2019 05:32 AM 5.2 4.8 - 5.6 % Final  Comment:    (NOTE) Pre diabetes:          5.7%-6.4% Diabetes:              >6.4% Glycemic control for   <7.0% adults with diabetes     CBG: Recent Labs  Lab 10/28/19 0307 10/28/19 0413 10/28/19 0503 10/28/19 0609 10/28/19 0703  GLUCAP 100* 104* 104* 131* 124*     This patient is critically ill with multiple organ system failure which requires frequent high complexity decision making, assessment, support, evaluation, and titration of therapies. This was completed through the application of advanced monitoring technologies and extensive interpretation of multiple databases. During this encounter critical care time was devoted to patient care services described in this note for 45 minutes.  Julian Hy, DO 10/28/19 10:00 AM [facility]

## 2019-10-28 NOTE — Progress Notes (Signed)
Pt paralyzed with 70 of Rocuronium due to pt agitated and trying to get up from bed. Multiple boluses of sedation given prior with no response. Cannulas secure, no displacement noted.

## 2019-10-28 NOTE — Procedures (Signed)
Arterial Catheter Insertion Procedure Note ZACORY FIOLA [id number] 1970-10-11  Procedure: Insertion of Arterial Catheter  Indications: Blood pressure monitoring and Frequent blood sampling  Procedure Details Consent: Risks of procedure as well as the alternatives and risks of each were explained to the (patient/caregiver).  Consent for procedure obtained. Time Out: Verified patient identification, verified procedure, site/side was marked, verified correct patient position, special equipment/implants available, medications/allergies/relevent history reviewed, required imaging and test results available.  Performed  Maximum sterile technique was used including antiseptics, cap, gloves, gown, hand hygiene, mask and sheet. Skin prep: Chlorhexidine; local anesthetic administered 20 gauge catheter was inserted into left radial artery using the Seldinger technique. ULTRASOUND GUIDANCE USED: NO Evaluation Blood flow good; BP tracing good. Complications: No apparent complications.   Brinden, [place] 10/28/2019

## 2019-10-28 NOTE — Progress Notes (Signed)
Pharmacy Antibiotic Note  William Crawford is a 49 y.o. male admitted on 10/25/2019 with VA ECMO.  Pharmacy has been consulted for Vancomycin and Cefepime dosing. -WBC= 5.8, afebrile, SCr= 0.8 -vancomycin trough= 16 at 1:30pm (dose given ~ 5:30am)   Plan: Continue Cefepime 2g IV every 8 hours Contiue vancomycin 1083m IV q8h -Will follow renal function and clinical progress    Height: 5' 8.5" (174 cm) Weight: 91 kg (200 lb 9.9 oz) IBW/kg (Calculated) : 69.55  Temp (24hrs), Avg:97.4 F (36.3 C), Min:97 F (36.1 C), Max:98.1 F (36.7 C)  Recent Labs  Lab 10/25/19 1859 10/25/19 2027 10/25/19 2027 10/26/19 0053 10/26/19 0305 10/26/19 0305 10/26/19 0907 10/26/19 1600 10/26/19 2124 10/27/19 0351 10/27/19 1556 10/28/19 0408 10/28/19 1330  WBC 7.3  --   --    < > 5.8   < >  --  6.1 6.1 6.2 6.1 5.8  --   CREATININE  --  0.99   < >  --   --   --  0.90 0.86  --  0.81 0.80 0.83  --   LATICACIDVEN 4.5* 3.2*  --   --  2.5*  --   --   --   --  0.7  --  0.8  --   VANCOTROUGH  --   --   --   --   --   --   --   --   --   --   --   --  16   < > = values in this interval not displayed.    Estimated Creatinine Clearance: 121.7 mL/min (by C-G formula based on SCr of 0.83 mg/dL).    No Known Allergies  Antimicrobials this admission: Cefuroxime 6/2 x2 doses Cefepime 6/2 >> Vancomycin 6/2 >>  Dose adjustments this admission: 6/15- vancomycin trough= 16, no adjustment needed   Microbiology results:   Thank you for allowing pharmacy to be a part of this patient's care.  AHildred Laser PharmD Clinical Pharmacist **Pharmacist phone directory can now be found on [email or web address] ([email or web address]).  Listed under [place]

## 2019-10-28 NOTE — Progress Notes (Signed)
Fem CVC removed per order. Manual pressure held by Everlene Other, RN for 15 minutes. Site dressed with petroleum gauze and gauze pressure dressing. Site stable.

## 2019-10-28 NOTE — Progress Notes (Signed)
Residuals checked this AM d/t abd distention. OGT hooked to suction and had 638m gastric output. TF stopped at this time and OGT to LIS.

## 2019-10-28 NOTE — Progress Notes (Signed)
Consult placed to remove right groin line. Jarrett Soho, RN aware and to remove line per order after current infusion complete.  Randol Kern, RN VAST

## 2019-10-28 NOTE — Progress Notes (Signed)
Per Dr. Prescott Gum, femoral sheath to be pulled after heparin gtt has been held for one hour. Femoral and dorsalis pedis pulses dopplered, sheath aspirated. Sheath pulled at 1255, pressure held for 20 minutes. Area remains soft. Pulses remain dopplerable. No bleeding noted. Pressure dressing applied at 1315. Heparin gtt to remain off for one hour after sheath removal per Dr. Prescott Gum.

## 2019-10-28 NOTE — Progress Notes (Signed)
Called to pts room for cuff leak. Assessed, adjusted and checked ETT cuff pressure (30cmH2O). No cuff leak at this time. RT will continue to monitor.

## 2019-10-28 NOTE — Progress Notes (Addendum)
ANTICOAGULATION CONSULT NOTE   Pharmacy Consult for IV Heparin Indication: ECMO  No Known Allergies  Patient Measurements: Height: 5' 8.5" (174 cm) Weight: 95.2 kg (209 lb 14.1 oz) IBW/kg (Calculated) : 69.55 Heparin Dosing Weight: 83.9 kg  Vital Signs: Temp: 97.5 F (36.4 C) (06/05 0000) Temp Source: Core (06/05 0000) BP: 93/70 (06/05 0000) Pulse Rate: 82 (06/04 2330)  Labs: Recent Labs    10/25/19 1859 10/25/19 2027 10/26/19 0305 10/26/19 0314 10/26/19 1600 10/26/19 1619 10/26/19 2124 10/26/19 2131 10/27/19 0351 10/27/19 0358 10/27/19 1205 10/27/19 1206 10/27/19 1556 10/27/19 1556 10/27/19 1606 10/27/19 1606 10/27/19 1630 10/27/19 1800 10/27/19 1812 10/27/19 2213 10/27/19 2358  HGB 7.8*   < > 8.2*   < > 8.2*   < > 8.7*   < > 8.3*   < >   < >  --  8.5*   < > 8.8*   < >  --   --  8.8* 7.8*  --   HCT 23.5*   < > 24.4*   < > 24.6*   < > 25.7*   < > 24.8*   < >   < >  --  25.0*   < > 26.0*  --   --   --  26.0* 23.0*  --   PLT 216   < > 197   < > 164   < > 153  --  145*  --   --   --  149*  --   --   --   --   --   --   --   --   APTT 47*   < >  --    < > 52*  --   --   --  71*  --   --   --   --   --   --   --  123*  --   --   --   --   LABPROT 15.1  --  14.7  --   --   --   --   --  15.4*  --   --   --   --   --   --   --   --   --   --   --   --   INR 1.2  --  1.2  --   --   --   --   --  1.3*  --   --   --   --   --   --   --   --   --   --   --   --   HEPARINUNFRC  --    < > <0.10*   < >  --    < > 0.15*   < > 0.15*   < >  --  0.16*  --   --   --   --   --  0.33  --   --  0.14*  CREATININE  --    < >  --    < > 0.86  --   --   --  0.81  --   --   --  0.80  --   --   --   --   --   --   --   --    < > = values in this interval not displayed.    Estimated Creatinine Clearance: 128.8 mL/min (by C-G formula based on SCr of  0.8 mg/dL).   Medical History: Past Medical History:  Diagnosis Date  . Alcohol abuse   . GERD (gastroesophageal reflux disease)   .  Tobacco abuse     Assessment: 49 year old male initiated on [place] ECMO.  [place] 48016 units in OR at time of cannulation Hemoglobin low but improved 7>8.7. Platelets 200.  S/p oozing from iv site > stitched and improved  Hep lvl low at 0.14 - no issues per RN - no pauses No issues with ECMO Circuit  Goal of Therapy:  Heparin Level goal 0.2-0.4 Monitor platelets by anticoagulation protocol: Yes   Plan:  Increase heparin to 1300 units/hr Recheck lvl in 6 hrs F/u plans to hold for remove of femoral a-line in am  Barth Kirks, PharmD, BCPS, BCCCP Clinical Pharmacist [phone]  Please check AMION for all [place] numbers  10/28/2019 1:15 AM  5:24 AM Addendum: Repeat hep lvl remains low at 0.15 aptt was previously elevated at 123, but now better at 74  No issues/bleeding per RN  Plan: Increase heparin to 1400 units/hr Recheck in 6 hrs

## 2019-10-28 NOTE — Progress Notes (Signed)
Pt contacts removed per MD, placed in saline solution in containers at bedside.

## 2019-10-28 NOTE — Progress Notes (Signed)
CT surgery p.m. Rounds  Patient stable with 4.2 L flow from [place] ECMO on milrinone and 2 mcg epi Patient had sudden agitation and tried to pull ET tube, was restrained and 1 dose of paralytic administered.  Patient was placed on fentanyl infusion in addition to Precedex  Plan repeat echo tomorrow Heparin drip up to 1500 units/h, checking heparin activity levels

## 2019-10-28 NOTE — Progress Notes (Addendum)
ANTICOAGULATION CONSULT NOTE   Pharmacy Consult for IV Heparin Indication: ECMO  No Known Allergies  Patient Measurements: Height: 5' 8.5" (174 cm) Weight: 91 kg (200 lb 9.9 oz) IBW/kg (Calculated) : 69.55 Heparin Dosing Weight: 83.9 kg  Vital Signs: Temp: 97.7 F (36.5 C) (06/05 1415) Temp Source: Core (06/05 1200) BP: 132/97 (06/05 1131) Pulse Rate: 72 (06/05 1131)  Labs: Recent Labs    10/26/19 0305 10/26/19 0314 10/27/19 0351 10/27/19 0358 10/27/19 1556 10/27/19 1606 10/27/19 1630 10/27/19 1800 10/27/19 2213 10/27/19 2358 10/28/19 0408 10/28/19 0408 10/28/19 0415 10/28/19 0415 10/28/19 0748 10/28/19 1054 10/28/19 1157 10/28/19 1224  HGB 8.2*   < > 8.3*   < > 8.5*   < >  --    < >   < >  --  8.7*   < > 7.8*   < > 7.5*  --   --  8.5*  HCT 24.4*   < > 24.8*   < > 25.0*   < >  --    < >   < >  --  26.6*   < > 23.0*  --  22.0*  --   --  25.0*  PLT 197   < > 145*  --  149*  --   --   --   --   --  137*  --   --   --   --   --   --   --   APTT  --    < > 71*  --   --   --  123*  --   --   --  74*  --   --   --   --   --   --   --   LABPROT 14.7  --  15.4*  --   --   --   --   --   --   --  14.6  --   --   --   --   --   --   --   INR 1.2  --  1.3*  --   --   --   --   --   --   --  1.2  --   --   --   --   --   --   --   HEPARINUNFRC <0.10*   < > 0.15*   < >  --   --   --    < >  --  0.14* 0.15*  --   --   --   --   --  0.26*  --   CREATININE  --    < > 0.81  --  0.80  --   --   --   --   --  0.83  --   --   --   --   --   --   --   TROPONINIHS  --   --   --   --   --   --   --   --   --   --   --   --   --   --   --  14,199*  --   --    < > = values in this interval not displayed.    Estimated Creatinine Clearance: 121.7 mL/min (by C-G formula based on SCr of 0.83 mg/dL).   Medical History: Past Medical History:  Diagnosis Date  . Alcohol abuse   .  GERD (gastroesophageal reflux disease)   . Tobacco abuse     Assessment: 49 year old male initiated on [place]  ECMO. Pharmacy dosing heparin -heparin level= 0.26 -Hg= 8.5   Goal of Therapy:  Heparin Level goal 0.2-0.4 Monitor platelets by anticoagulation protocol: Yes   Plan:  -Continue heparin at 1500 units/hr -Heparin level and CBC every 12 hours (5am/5pm)  Hildred Laser, PharmD Clinical Pharmacist **Pharmacist phone directory can now be found on [email or web address] ([email or web address]).  Listed under [place].   Addendum -heparin level= 0.19  Plan -Increase to 1550 units/hr -Heparin level and CBC in am  Hildred Laser, PharmD Clinical Pharmacist **Pharmacist phone directory can now be found on [email or web address] ([email or web address]).  Listed under [place].

## 2019-10-29 ENCOUNTER — Inpatient Hospital Stay (HOSPITAL_COMMUNITY): Payer: BLUE CROSS/BLUE SHIELD

## 2019-10-29 DIAGNOSIS — I5021 Acute systolic (congestive) heart failure: Secondary | ICD-10-CM

## 2019-10-29 LAB — COMPREHENSIVE METABOLIC PANEL
ALT: 51 U/L — ABNORMAL HIGH (ref 0–44)
AST: 44 U/L — ABNORMAL HIGH (ref 15–41)
Albumin: 2.4 g/dL — ABNORMAL LOW (ref 3.5–5.0)
Alkaline Phosphatase: 59 U/L (ref 38–126)
Anion gap: 12 (ref 5–15)
BUN: 8 mg/dL (ref 6–20)
CO2: 24 mmol/L (ref 22–32)
Calcium: 8.6 mg/dL — ABNORMAL LOW (ref 8.9–10.3)
Chloride: 104 mmol/L (ref 98–111)
Creatinine, Ser: 0.79 mg/dL (ref 0.61–1.24)
GFR calc Af Amer: >60 mL/min (ref >60)
GFR calc non Af Amer: >60 mL/min (ref >60)
Glucose, Bld: 91 mg/dL (ref 70–99)
Potassium: 3.7 mmol/L (ref 3.5–5.1)
Sodium: 140 mmol/L (ref 135–145)
Total Bilirubin: 0.9 mg/dL (ref 0.3–1.2)
Total Protein: 5.5 g/dL — ABNORMAL LOW (ref 6.5–8.1)

## 2019-10-29 LAB — PROTIME-INR
INR: 1.1 (ref 0.8–1.2)
Prothrombin Time: 14 seconds (ref 11.4–15.2)

## 2019-10-29 LAB — POCT I-STAT 7, (LYTES, BLD GAS, ICA,H+H)
Acid-Base Excess: 5 mmol/L — ABNORMAL HIGH (ref 0.0–2.0)
Acid-Base Excess: 5 mmol/L — ABNORMAL HIGH (ref 0.0–2.0)
Acid-Base Excess: 3 mmol/L — ABNORMAL HIGH (ref 0.0–2.0)
Acid-Base Excess: 4 mmol/L — ABNORMAL HIGH (ref 0.0–2.0)
Acid-Base Excess: 6 mmol/L — ABNORMAL HIGH (ref 0.0–2.0)
Bicarbonate: 29.7 mmol/L — ABNORMAL HIGH (ref 20.0–28.0)
Bicarbonate: 29.4 mmol/L — ABNORMAL HIGH (ref 20.0–28.0)
Bicarbonate: 28 mmol/L (ref 20.0–28.0)
Bicarbonate: 28.6 mmol/L — ABNORMAL HIGH (ref 20.0–28.0)
Bicarbonate: 29.9 mmol/L — ABNORMAL HIGH (ref 20.0–28.0)
Calcium, Ion: 1.21 mmol/L (ref 1.15–1.40)
Calcium, Ion: 1.2 mmol/L (ref 1.15–1.40)
Calcium, Ion: 1.21 mmol/L (ref 1.15–1.40)
Calcium, Ion: 1.22 mmol/L (ref 1.15–1.40)
Calcium, Ion: 1.2 mmol/L (ref 1.15–1.40)
HCT: 26 % — ABNORMAL LOW (ref 39.0–52.0)
HCT: 26 % — ABNORMAL LOW (ref 39.0–52.0)
HCT: 25 % — ABNORMAL LOW (ref 39.0–52.0)
HCT: 26 % — ABNORMAL LOW (ref 39.0–52.0)
HCT: 27 % — ABNORMAL LOW (ref 39.0–52.0)
Hemoglobin: 8.8 g/dL — ABNORMAL LOW (ref 13.0–17.0)
Hemoglobin: 8.8 g/dL — ABNORMAL LOW (ref 13.0–17.0)
Hemoglobin: 8.5 g/dL — ABNORMAL LOW (ref 13.0–17.0)
Hemoglobin: 8.8 g/dL — ABNORMAL LOW (ref 13.0–17.0)
Hemoglobin: 9.2 g/dL — ABNORMAL LOW (ref 13.0–17.0)
O2 Saturation: 100 %
O2 Saturation: 100 %
O2 Saturation: 100 %
O2 Saturation: 100 %
O2 Saturation: 100 %
Patient temperature: 36.7
Patient temperature: 36.8
Patient temperature: 36.7
Patient temperature: 36.8
Patient temperature: 36.8
Potassium: 3.7 mmol/L (ref 3.5–5.1)
Potassium: 3.6 mmol/L (ref 3.5–5.1)
Potassium: 4 mmol/L (ref 3.5–5.1)
Potassium: 3.9 mmol/L (ref 3.5–5.1)
Potassium: 3.9 mmol/L (ref 3.5–5.1)
Sodium: 140 mmol/L (ref 135–145)
Sodium: 140 mmol/L (ref 135–145)
Sodium: 140 mmol/L (ref 135–145)
Sodium: 140 mmol/L (ref 135–145)
Sodium: 140 mmol/L (ref 135–145)
TCO2: 31 mmol/L (ref 22–32)
TCO2: 31 mmol/L (ref 22–32)
TCO2: 29 mmol/L (ref 22–32)
TCO2: 30 mmol/L (ref 22–32)
TCO2: 31 mmol/L (ref 22–32)
pCO2 arterial: 41 mmHg (ref 32.0–48.0)
pCO2 arterial: 40.5 mmHg (ref 32.0–48.0)
pCO2 arterial: 41 mmHg (ref 32.0–48.0)
pCO2 arterial: 42.3 mmHg (ref 32.0–48.0)
pCO2 arterial: 40.2 mmHg (ref 32.0–48.0)
pH, Arterial: 7.466 — ABNORMAL HIGH (ref 7.350–7.450)
pH, Arterial: 7.468 — ABNORMAL HIGH (ref 7.350–7.450)
pH, Arterial: 7.441 (ref 7.350–7.450)
pH, Arterial: 7.437 (ref 7.350–7.450)
pH, Arterial: 7.478 — ABNORMAL HIGH (ref 7.350–7.450)
pO2, Arterial: 420 mmHg — ABNORMAL HIGH (ref 83.0–108.0)
pO2, Arterial: 390 mmHg — ABNORMAL HIGH (ref 83.0–108.0)
pO2, Arterial: 346 mmHg — ABNORMAL HIGH (ref 83.0–108.0)
pO2, Arterial: 371 mmHg — ABNORMAL HIGH (ref 83.0–108.0)
pO2, Arterial: 356 mmHg — ABNORMAL HIGH (ref 83.0–108.0)

## 2019-10-29 LAB — BASIC METABOLIC PANEL
Anion gap: 9 (ref 5–15)
Anion gap: 14 (ref 5–15)
BUN: 7 mg/dL (ref 6–20)
BUN: 9 mg/dL (ref 6–20)
CO2: 24 mmol/L (ref 22–32)
CO2: 23 mmol/L (ref 22–32)
Calcium: 8.5 mg/dL — ABNORMAL LOW (ref 8.9–10.3)
Calcium: 8.4 mg/dL — ABNORMAL LOW (ref 8.9–10.3)
Chloride: 104 mmol/L (ref 98–111)
Chloride: 103 mmol/L (ref 98–111)
Creatinine, Ser: 0.93 mg/dL (ref 0.61–1.24)
Creatinine, Ser: 0.93 mg/dL (ref 0.61–1.24)
GFR calc Af Amer: >60 mL/min (ref >60)
GFR calc Af Amer: >60 mL/min (ref >60)
GFR calc non Af Amer: >60 mL/min (ref >60)
GFR calc non Af Amer: >60 mL/min (ref >60)
Glucose, Bld: 99 mg/dL (ref 70–99)
Glucose, Bld: 110 mg/dL — ABNORMAL HIGH (ref 70–99)
Potassium: 3.9 mmol/L (ref 3.5–5.1)
Potassium: 3.6 mmol/L (ref 3.5–5.1)
Sodium: 137 mmol/L (ref 135–145)
Sodium: 140 mmol/L (ref 135–145)

## 2019-10-29 LAB — ECHOCARDIOGRAM LIMITED
Height: 68.5 in
Weight: 3097.02 oz

## 2019-10-29 LAB — CBC
HCT: 29.8 % — ABNORMAL LOW (ref 39.0–52.0)
HCT: 30.1 % — ABNORMAL LOW (ref 39.0–52.0)
Hemoglobin: 9.9 g/dL — ABNORMAL LOW (ref 13.0–17.0)
Hemoglobin: 9.8 g/dL — ABNORMAL LOW (ref 13.0–17.0)
MCH: 29.4 pg (ref 26.0–34.0)
MCH: 28.8 pg (ref 26.0–34.0)
MCHC: 33.2 g/dL (ref 30.0–36.0)
MCHC: 32.6 g/dL (ref 30.0–36.0)
MCV: 88.4 fL (ref 80.0–100.0)
MCV: 88.5 fL (ref 80.0–100.0)
Platelets: 135 10*3/uL — ABNORMAL LOW (ref 150–400)
Platelets: 127 10*3/uL — ABNORMAL LOW (ref 150–400)
RBC: 3.37 MIL/uL — ABNORMAL LOW (ref 4.22–5.81)
RBC: 3.4 MIL/uL — ABNORMAL LOW (ref 4.22–5.81)
RDW: 13.8 % (ref 11.5–15.5)
RDW: 13.8 % (ref 11.5–15.5)
WBC: 7.2 10*3/uL (ref 4.0–10.5)
WBC: 7.6 10*3/uL (ref 4.0–10.5)
nRBC: 0 % (ref 0.0–0.2)
nRBC: 0 % (ref 0.0–0.2)

## 2019-10-29 LAB — COOXEMETRY PANEL
Carboxyhemoglobin: 1.5 % (ref 0.5–1.5)
Methemoglobin: 1.1 % (ref 0.0–1.5)
O2 Saturation: 70.5 %
Total hemoglobin: 18.3 g/dL — ABNORMAL HIGH (ref 12.0–16.0)

## 2019-10-29 LAB — HEPARIN LEVEL (UNFRACTIONATED)
Heparin Unfractionated: 0.27 IU/mL — ABNORMAL LOW (ref 0.30–0.70)
Heparin Unfractionated: 0.33 IU/mL (ref 0.30–0.70)
Heparin Unfractionated: 0.34 IU/mL (ref 0.30–0.70)

## 2019-10-29 LAB — GLUCOSE, CAPILLARY
Glucose-Capillary: 83 mg/dL (ref 70–99)
Glucose-Capillary: 87 mg/dL (ref 70–99)
Glucose-Capillary: 91 mg/dL (ref 70–99)
Glucose-Capillary: 94 mg/dL (ref 70–99)
Glucose-Capillary: 99 mg/dL (ref 70–99)

## 2019-10-29 LAB — URINALYSIS, ROUTINE W REFLEX MICROSCOPIC
Bilirubin Urine: NEGATIVE
Glucose, UA: NEGATIVE mg/dL
Hgb urine dipstick: NEGATIVE
Ketones, ur: NEGATIVE mg/dL
Leukocytes,Ua: NEGATIVE
Nitrite: NEGATIVE
Protein, ur: NEGATIVE mg/dL
Specific Gravity, Urine: 1.018 (ref 1.005–1.030)
pH: 6 (ref 5.0–8.0)

## 2019-10-29 LAB — LACTATE DEHYDROGENASE: LDH: 514 U/L — ABNORMAL HIGH (ref 98–192)

## 2019-10-29 LAB — PHOSPHORUS: Phosphorus: 4.5 mg/dL (ref 2.5–4.6)

## 2019-10-29 LAB — PREPARE RBC (CROSSMATCH)

## 2019-10-29 LAB — MAGNESIUM
Magnesium: 2.1 mg/dL (ref 1.7–2.4)
Magnesium: 2 mg/dL (ref 1.7–2.4)

## 2019-10-29 LAB — APTT
aPTT: 100 seconds — ABNORMAL HIGH (ref 24–36)
aPTT: 88 seconds — ABNORMAL HIGH (ref 24–36)

## 2019-10-29 LAB — LACTIC ACID, PLASMA: Lactic Acid, Venous: 1 mmol/L (ref 0.5–1.9)

## 2019-10-29 LAB — FIBRINOGEN: Fibrinogen: >800 mg/dL — ABNORMAL HIGH (ref 210–475)

## 2019-10-29 MED ORDER — CEFAZOLIN SODIUM-DEXTROSE 2-4 GM/100ML-% IV SOLN
2.0000 g | INTRAVENOUS | Status: DC
  Filled 2019-10-29: qty 100

## 2019-10-29 MED ORDER — SODIUM CHLORIDE 0.9 % IV SOLN
0.7500 ug/kg/min | INTRAVENOUS | Status: AC
  Administered 2019-10-29: 0.75 ug/kg/min via INTRAVENOUS
  Filled 2019-10-29: qty 50

## 2019-10-29 MED ORDER — POTASSIUM CHLORIDE 10 MEQ/50ML IV SOLN
10.0000 meq | INTRAVENOUS | Status: AC
  Administered 2019-10-29 – 2019-10-30 (×4): 10 meq via INTRAVENOUS
  Filled 2019-10-29 (×2): qty 50

## 2019-10-29 MED ORDER — POTASSIUM CHLORIDE 20 MEQ/15ML (10%) PO SOLN
40.0000 meq | Freq: Once | ORAL | Status: AC
  Administered 2019-10-29: 40 meq
  Filled 2019-10-29: qty 30

## 2019-10-29 MED ORDER — MAGNESIUM SULFATE 2 GM/50ML IV SOLN
2.0000 g | Freq: Once | INTRAVENOUS | Status: AC
  Administered 2019-10-29: 2 g via INTRAVENOUS
  Filled 2019-10-29: qty 50

## 2019-10-29 MED ORDER — FUROSEMIDE 10 MG/ML IJ SOLN
20.0000 mg | Freq: Once | INTRAMUSCULAR | Status: AC
  Administered 2019-10-29: 20 mg via INTRAVENOUS
  Filled 2019-10-29: qty 2

## 2019-10-29 MED ORDER — ALBUMIN HUMAN 5 % IV SOLN
12.5000 g | Freq: Once | INTRAVENOUS | Status: AC
  Administered 2019-10-29: 12.5 g via INTRAVENOUS
  Filled 2019-10-29: qty 250

## 2019-10-29 NOTE — Progress Notes (Signed)
NAME:  William Crawford, MRN:  [id number], DOB:  05/27/1970, LOS: 4 ADMISSION DATE:  10/25/2019, CONSULTATION DATE:  10/25/19 REFERRING MD:  [place], CHIEF COMPLAINT:  Heart failure  Brief History   49 yo male admitted to ICU on 10/25/19 following CTO PCI to LAD at which time he unfortunately developed a perforated LAD and went into vfib arrest. He subsequently underwent emergent repair however was unable to be removed from bypass following the procedure. PCCM consulted for ECMO.  History of present illness   Pt initially presented with unstable angina on 5/25 at which time he underwent a heart cath which revealed occlusive disease involving the RCA as well as some chronic occlusions involving the first OM branch and proximal LAD. DES placed to the RCA lesion. He was subsequently discharged on DAPT and returned today for stent placement to the LAD.  He, unfortunately, developed a large perforation to the LAD during the procedure subsequently undergoing vfib arrest (time to ROSC 63mn) and was taken for emergent repair.  Past Medical History  Tobacco use, alcohol use, HFrEF, CAD s/p DES placement 5/25  Significant Hospital Events   6/2 LAD perforation during DES placement>Vfib arrest (14 min)>emergent repair>unable to wean bypass>PCCM consulted for ECMO  Consults:  PCCM Cardiology CT surgery  Procedures:  6/2 PCI 6/2 LAD repair 6/2 ECMO  ETT 6/2>> Central line 6/2>>  Significant Diagnostic Tests:  5/25 echocardiogram>>LVEF 35-40% with regional wall motion abnormalities. Severe inferolateral hypokinesis; G1DD. No valvular disease  Micro Data:  n/a  Antimicrobials:  Cefepime 6/2> vanc 6/2>  Interim history/subjective:  Very agitated during the day yesterday, but no events overnight.  Sedation increases yesterday.  Back on Levophed.  Still able to follow commands.  Objective   Blood pressure 98/75, pulse 79, temperature (!) 97.3 F (36.3 C), resp. rate (!) 7, height 5' 8.5" (1.74  m), weight 87.8 kg, SpO2 100 %. PAP: (14-30)/(9-22) 19/11 CVP:  [7 mmHg-19 mmHg] 11 mmHg  Vent Mode: PRVC FiO2 (%):  [30 %] 30 % Set Rate:  [10 bmp] 10 bmp Vt Set:  [480 mL] 480 mL PEEP:  [10 cmH20] 10 cmH20 Plateau Pressure:  [22 cmH20-23 cmH20] 22 cmH20   Intake/Output Summary (Last 24 hours) at 10/29/2019 0850 Last data filed at 10/29/2019 0800 Gross per 24 hour  Intake 3273.58 ml  Output 7715 ml  Net -4441.42 ml   Filed Weights   10/27/19 0400 10/28/19 0400 10/29/19 0500  Weight: 95.2 kg 91 kg 87.8 kg    Examination: General: Critically ill-appearing man lying in bed intubated, sedated, on ECMO HENT: [place]/AT, eyes anicteric, oral mucosa moist, ET tube and OG tube Lungs: Clear to auscultation bilaterally, diminished breath sounds.  Breathing spontaneously over the vent. Cardiovascular: Open chest, centrally cannulated for ECMO.  Paced rhythm. Abdomen: Soft, nontender, nondistended.  Hypoactive bowel sounds. Extremities: No clubbing, cyanosis, or edema.  Right femoral sheath removed.  Left radial A-line.  Both hands and feet with good distal perfusion.  Mitten restraints. Neuro: Sedated, RASS -5.  Opens his eyes with suctioning, + cough. GU: Foley in place draining clear yellow urine  VA ecmo 4.4L/min, RMP 3370, sweep 4.31L delta P 20 P ven -46 SPO2 72%  Vent PRVC 10/480/10/30 %, P plat 23  CXR 6/6 personally reviewed-vascular device is in stable position, ETT in appropriate position.  Resolved Hospital Problem list   Pericardial tamponade--2/2 LAD perforation  Assessment & Plan:   Cardiogenic shock. EF 45% prior to OR. Post Vfib arrest echo suggestive  of biventricular failure. Decompensated heart failure s/p thoracotomy for repair of perforation of LAD during PCI. Unable to be weaned from bypass in OR. On V-A ECMO since 6/2. Mild stable hemolysis, no signs of circuit clotting. Improved pulsatility this morning. Backing down on pressor/iatrogenic support Post operative  anemia. hgb 7.0 post op. Hgb 8.2 this morning Plan -Appreciate cardiology and CTS's management -Wean norepinephrine.  Continue milrinone and epinephrine. -Continue heparin per pharmacy -Repeat echocardiogram today -continue Swan-Ganz for invasive monitoring. -Lasix 20 mg IV once -ECMO protocol -DAPT and starting statin  ICM, acute HFrEF,  CAD, h/o HTN.  NSTEMI s/p DES to RCA 5/25.  Cardiogenic shock; likely distributive shock from sedation contributing Plan -Continue holding beta-blocker and ACE/ARB due to shock -Continue inotropes and vasopressors plus VA ECMO for mechanical support -Repeat echocardiogram -Strict I/Os -Continue Swan-Ganz for invasive monitoring -Continue telemetry monitoring -Continue atrial pacing -Diuresis to maintain euvolemia -Continue statin, DAPT  Acute hypoxic respiratory failure requiring mechanical ventilation.  Tobacco use--1ppd smoker CXR this morning showing ETT around level of [place]ina--pulled back. Plan:   -Continue low tidal volume ventilation, 4-8 cc/kg ideal body weight with goal plateau less than 30 driving pressure less than 15.  Remains at goal. -VAP prevention protocol -Sedation as required to tolerate mechanical ventilation and ECMO. -Daily SAT and SBT when appropriate  High risk alcohol use Risk for ETOH w/d Plan:  -Continue Versed infusion -Supplemental vitamins, trickle tube feeds to restart today  High risk malnutrition  -Resume trickle tube feeds  Hypokalemia Hypophosphatemia-resolved -Continue to monitor  Elevated transaminases likely due to cardiac failure-improving -Continue to monitor  Best practice:  Diet: NPO. Tube feeds Pain/Anxiety/Delirium protocol (if indicated): dilaudid, precedex, versed VAP protocol (if indicated): yes DVT prophylaxis: heparin gtt GI prophylaxis: pepcid Glucose control: per primary Mobility: BR Code Status: Full Family Communication: mother updated at bedside 6/4 Disposition:  ICU  Labs   CBC: Recent Labs  Lab 10/27/19 0351 10/27/19 0358 10/27/19 1556 10/27/19 1606 10/28/19 0408 10/28/19 0415 10/28/19 1224 10/28/19 1650 10/28/19 1652 10/28/19 2330 10/29/19 0429  WBC 6.2  --  6.1  --  5.8  --   --  6.5  --   --  7.2  HGB 8.3*   < > 8.5*   < > 8.7*   < > 8.5* 9.2* 8.5* 9.2* 9.9*  8.8*  HCT 24.8*   < > 25.0*   < > 26.6*   < > 25.0* 27.8* 25.0* 27.0* 29.8*  26.0*  MCV 87.0  --  85.9  --  88.4  --   --  87.4  --   --  88.4  PLT 145*  --  149*  --  137*  --   --  136*  --   --  135*   < > = values in this interval not displayed.    Basic Metabolic Panel: Recent Labs  Lab 10/26/19 1600 10/26/19 1619 10/27/19 0351 10/27/19 0358 10/27/19 1556 10/27/19 1606 10/28/19 0408 10/28/19 0415 10/28/19 1224 10/28/19 1650 10/28/19 1652 10/28/19 2330 10/29/19 0429  NA 139   < > 141   < > 140   < > 141   < > 141 139 141 141 140  140  K 3.8   < > 3.4*   < > 2.9*   < > 3.8   < > 3.7 3.9 3.8 3.6 3.7  3.7  CL 107   < > 107  --  105  --  108  --   --  104  --   --  104  CO2 26   < > 25  --  25  --  24  --   --  25  --   --  24  GLUCOSE 122*   < > 99  --  120*  --  110*  --   --  94  --   --  91  BUN 6   < > 6  --  6  --  6  --   --  5*  --   --  8  CREATININE 0.86   < > 0.81  --  0.80  --  0.83  --   --  0.79  --   --  0.79  CALCIUM 7.4*   < > 7.5*  --  7.8*  --  7.8*  --   --  8.2*  --   --  8.6*  MG 2.1  --  1.9  --  2.0  --  2.3  --   --   --   --   --  2.1  PHOS 2.4*  --  2.1*  --  1.1*  --  3.3  --   --   --   --   --  4.5   < > = values in this interval not displayed.   GFR: Estimated Creatinine Clearance: 124.2 mL/min (by C-G formula based on SCr of 0.79 mg/dL). Recent Labs  Lab 10/26/19 0305 10/26/19 1600 10/27/19 0351 10/27/19 0351 10/27/19 1556 10/28/19 0408 10/28/19 1650 10/29/19 0429  WBC 5.8   < > 6.2   < > 6.1 5.8 6.5 7.2  LATICACIDVEN 2.5*  --  0.7  --   --  0.8  --  1.0   < > = values in this interval not displayed.    Liver  Function Tests: Recent Labs  Lab 10/26/19 0305 10/26/19 0907 10/27/19 0351 10/28/19 0408 10/29/19 0429  AST 274* 249* 150* 77* 44*  ALT 90* 86* 72* 59* 51*  ALKPHOS 35* 37* 42 49 59  BILITOT 1.1 1.1 0.8 0.5 0.9  PROT 4.6* 4.4* 4.4* 4.9* 5.5*  ALBUMIN 2.7* 2.5* 2.4* 2.3* 2.4*   No results for input(s): LIPASE, AMYLASE in the last 168 hours. No results for input(s): AMMONIA in the last 168 hours.  ABG    Component Value Date/Time   PHART 7.466 (H) 10/29/2019 0429   PCO2ART 41.0 10/29/2019 0429   PO2ART 420 (H) 10/29/2019 0429   HCO3 29.7 (H) 10/29/2019 0429   TCO2 31 10/29/2019 0429   O2SAT 70.5 10/29/2019 0429   O2SAT 100.0 10/29/2019 0429     Coagulation Profile: Recent Labs  Lab 10/25/19 1859 10/26/19 0305 10/27/19 0351 10/28/19 0408 10/29/19 0429  INR 1.2 1.2 1.3* 1.2 1.1    Cardiac Enzymes: No results for input(s): CKTOTAL, CKMB, CKMBINDEX, TROPONINI in the last 168 hours.  HbA1C: Hgb A1c MFr Bld  Date/Time Value Ref Range Status  10/18/2019 05:32 AM 5.2 4.8 - 5.6 % Final    Comment:    (NOTE) Pre diabetes:          5.7%-6.4% Diabetes:              >6.4% Glycemic control for   <7.0% adults with diabetes     CBG: Recent Labs  Lab 10/28/19 1649 10/28/19 1939 10/28/19 2328 10/29/19 0427 10/29/19 0755  GLUCAP 87 88 93 83 87     This patient is critically ill with multiple  organ system failure which requires frequent high complexity decision making, assessment, support, evaluation, and titration of therapies. This was completed through the application of advanced monitoring technologies and extensive interpretation of multiple databases. During this encounter critical care time was devoted to patient care services described in this note for 43 minutes.  Julian Hy, DO 10/29/19 8:59 AM [facility]

## 2019-10-29 NOTE — Anesthesia Preprocedure Evaluation (Addendum)
Anesthesia Evaluation  Patient identified by MRN, date of birth, ID band Patient awake    Reviewed: Allergy & Precautions, NPO status , Patient's Chart, lab work & pertinent test results  History of Anesthesia Complications Negative for: history of anesthetic complications  Airway Mallampati: Intubated       Dental  (+) Poor Dentition,    Pulmonary Current Smoker,    + rhonchi        Cardiovascular hypertension, Pt. on medications + CAD, + Past MI, + Cardiac Stents and +CHF   Rhythm:Regular Rate:Tachycardia  IMPRESSIONS    1. Ramp test. Left ventricular ejection fraction, by estimation, is 20 to  25%. The left ventricle has severely decreased function. The left  ventricle has no regional wall motion abnormalities.  2. Right ventricular systolic function is normal. The right ventricular  size is normal.  3. The mitral valve is normal in structure.  4. The aortic valve is normal in structure.    Neuro/Psych negative neurological ROS     GI/Hepatic GERD  ,(+)     substance abuse  alcohol use,   Endo/Other    Renal/GU      Musculoskeletal   Abdominal   Peds  Hematology   Anesthesia Other Findings   Reproductive/Obstetrics                          Anesthesia Physical  Anesthesia Plan  ASA: IV  Anesthesia Plan: General   Post-op Pain Management:    Induction: Intravenous  PONV Risk Score and Plan: 1 and Ondansetron  Airway Management Planned: Oral ETT  Additional Equipment: PA Cath, TEE, Ultrasound Guidance Line Placement, Arterial line and CVP  Intra-op Plan:   Post-operative Plan: Post-operative intubation/ventilation  Informed Consent:   Plan Discussed with: Anesthesiologist, CRNA and Surgeon  Anesthesia Plan Comments:       Anesthesia Quick Evaluation

## 2019-10-29 NOTE — Progress Notes (Signed)
ANTICOAGULATION CONSULT NOTE   Pharmacy Consult for IV Heparin Indication: ECMO  No Known Allergies  Patient Measurements: Height: 5' 8.5" (174 cm) Weight: 87.8 kg (193 lb 9 oz) IBW/kg (Calculated) : 69.55 Heparin Dosing Weight: 83.9 kg  Vital Signs: Temp: 97.5 F (36.4 C) (06/06 0745) Temp Source: Core (06/06 0000) BP: 98/75 (06/06 0745) Pulse Rate: 80 (06/06 0745)  Labs: Recent Labs    10/27/19 0351 10/27/19 0358 10/28/19 0408 10/28/19 0408 10/28/19 0415 10/28/19 1054 10/28/19 1157 10/28/19 1224 10/28/19 1650 10/28/19 1650 10/28/19 1652 10/28/19 1652 10/28/19 2330 10/29/19 0429  HGB 8.3*   < > 8.7*  --    < >  --   --    < > 9.2*   < > 8.5*   < > 9.2* 9.9*  8.8*  HCT 24.8*   < > 26.6*  --    < >  --   --    < > 27.8*   < > 25.0*  --  27.0* 29.8*  26.0*  PLT 145*   < > 137*  --   --   --   --   --  136*  --   --   --   --  135*  APTT 71*   < > 74*  --   --   --   --   --  78*  --   --   --   --  100*  LABPROT 15.4*  --  14.6  --   --   --   --   --   --   --   --   --   --  14.0  INR 1.3*  --  1.2  --   --   --   --   --   --   --   --   --   --  1.1  HEPARINUNFRC 0.15*   < > 0.15*   < >  --   --  0.26*  --  0.19*  --   --   --   --  0.27*  CREATININE 0.81   < > 0.83  --   --   --   --   --  0.79  --   --   --   --  0.79  TROPONINIHS  --   --   --   --   --  14,199*  --   --   --   --   --   --   --   --    < > = values in this interval not displayed.    Estimated Creatinine Clearance: 124.2 mL/min (by C-G formula based on SCr of 0.79 mg/dL).   Medical History: Past Medical History:  Diagnosis Date  . Alcohol abuse   . GERD (gastroesophageal reflux disease)   . Tobacco abuse     Assessment: 49 year old male initiated on [place] ECMO. Pharmacy dosing heparin -heparin level= 0.27 -Hg= 9.9, Pltc 135  No overt bleeding or complications noted.  Discussed with Dr. Aundra Dubin - will change heparin level goal to usual ECMO range of 0.3-0.5.  Goal of Therapy:   Heparin Level goal 0.3-0.5 Monitor platelets by anticoagulation protocol: Yes   Plan:  -Increase IV heparin to 1600 units/hr -Recheck heparin level in 6 hrs -Heparin level and CBC every 12 hours (5am/5pm)  [place] Crane, Vena Austria, BCPS, [facility] Clinical Pharmacist  10/29/2019 8:04 AM   [facility] pharmacy phone  numbers are listed on [email or web address]

## 2019-10-29 NOTE — Progress Notes (Addendum)
ANTICOAGULATION CONSULT NOTE   Pharmacy Consult for IV Heparin Indication: ECMO  No Known Allergies  Patient Measurements: Height: 5' 8.5" (174 cm) Weight: 87.8 kg (193 lb 9 oz) IBW/kg (Calculated) : 69.55 Heparin Dosing Weight: 83.9 kg  Vital Signs: Temp: 97.5 F (36.4 C) (06/06 1730) Temp Source: Core (06/06 1600) BP: 90/70 (06/06 1525) Pulse Rate: 79 (06/06 1730)  Labs: Recent Labs    10/27/19 0351 10/27/19 0358 10/28/19 0408 10/28/19 0415 10/28/19 1054 10/28/19 1157 10/28/19 1650 10/28/19 1652 10/29/19 0429 10/29/19 0808 10/29/19 1230 10/29/19 1230 10/29/19 1335 10/29/19 1624 10/29/19 1714  HGB 8.3*   < > 8.7*   < >  --    < > 9.2*   < > 9.9*  8.8*   < > 8.5*   < >  --  8.8* 9.8*  HCT 24.8*   < > 26.6*   < >  --    < > 27.8*   < > 29.8*  26.0*   < > 25.0*  --   --  26.0* 30.1*  PLT 145*   < > 137*   < >  --   --  136*  --  135*  --   --   --   --   --  127*  APTT 71*   < > 74*   < >  --   --  78*  --  100*  --   --   --   --   --  88*  LABPROT 15.4*  --  14.6  --   --   --   --   --  14.0  --   --   --   --   --   --   INR 1.3*  --  1.2  --   --   --   --   --  1.1  --   --   --   --   --   --   HEPARINUNFRC 0.15*   < > 0.15*  --   --    < > 0.19*   < > 0.27*  --   --   --  0.33  --  0.34  CREATININE 0.81   < > 0.83  --   --   --  0.79  --  0.79  --   --   --   --   --   --   TROPONINIHS  --   --   --   --  14,199*  --   --   --   --   --   --   --   --   --   --    < > = values in this interval not displayed.    Estimated Creatinine Clearance: 124.2 mL/min (by C-G formula based on SCr of 0.79 mg/dL).   Medical History: Past Medical History:  Diagnosis Date  . Alcohol abuse   . GERD (gastroesophageal reflux disease)   . Tobacco abuse     Assessment: 49 year old male initiated on [place] ECMO. Pharmacy dosing heparin -heparin level= 0.34 -Hg= 9.8, Pltc 127    Goal of Therapy:  Heparin Level goal 0.3-0.5 Monitor platelets by anticoagulation  protocol: Yes   Plan:  -Continue IV heparin 1600 units/hr -Heparin level and CBC every 12 hours (5am/5pm)  Hildred Laser, PharmD Clinical Pharmacist **Pharmacist phone directory can now be found on [email or web address] ([email or web address]).  Listed under [place].

## 2019-10-29 NOTE — Progress Notes (Addendum)
Patient ID: William Crawford, male   DOB: 1970/10/22, 49 y.o.   MRN: [id number]     Advanced Heart Failure Rounding Note  PCP-Cardiologist: No primary care provider on file.   Subjective:    - LAD repair + VA ECMO cannulation in OR 6/3. Chest wall open.  Patient woke up on vent last night, tried to pull out tube.  Re-sedated.   Lactate 1, co-ox 71%.  This morning, he is on epinephrine 1, milrinone 0.375, NE 3.  Heparin level remains mildly low. Flow is pulsatile on a-line. I/Os markedly negative with Lasix 40 mg IV bid. CVP 11.   LDH 662 => 594 => 548 => 514 ALT 90 => 72 => 51 AST 274 => 150 => 44 hgb 8.2 => 8.3 => 8.7 => 8.9 Creatinine 0.99 => 0.81 => 0.83 => 0.79  ECMO circuit functioning normally, no chugging with good flow.   7.47/41/420  Echo (6/3): EF < 20%, severe RV dysfunction  [place]: CVP 11 PA 21/13 Co-ox 71%  ECMO circuit Flow 4 L/min  3370 rpm -41 DeltaP 18 Sweep 4  Objective:   Weight Range: 87.8 kg Body mass index is 29 kg/m.   Vital Signs:   Temp:  [97 F (36.1 C)-98.1 F (36.7 C)] 97.5 F (36.4 C) (06/06 0745) Pulse Rate:  [25-82] 80 (06/06 0745) Resp:  [0-22] 10 (06/06 0745) BP: (89-159)/(69-100) 98/75 (06/06 0745) SpO2:  [78 %-100 %] 100 % (06/06 0745) Arterial Line BP: (78-138)/(65-91) 114/82 (06/06 0730) FiO2 (%):  [30 %] 30 % (06/06 0745) Weight:  [87.8 kg] 87.8 kg (06/06 0500) Last BM Date: (PTA)  Weight change: Filed Weights   10/27/19 0400 10/28/19 0400 10/29/19 0500  Weight: 95.2 kg 91 kg 87.8 kg    Intake/Output:   Intake/Output Summary (Last 24 hours) at 10/29/2019 0748 Last data filed at 10/29/2019 0700 Gross per 24 hour  Intake 3381.33 ml  Output 8215 ml  Net -4833.67 ml      Physical Exam    General: sedated on vent Neck: No JVD, no thyromegaly or thyroid nodule.  Lungs: Clear to auscultation bilaterally with normal respiratory effort. CV: Nondisplaced PMI.  Heart regular S1/S2, no S3/S4, no murmur.  No  peripheral edema.   Abdomen: Soft, nontender, no hepatosplenomegaly, no distention.  Skin: Intact without lesions or rashes.  Neurologic: Sedated. Extremities: No clubbing or cyanosis.  HEENT: Normal.    Telemetry   A-paced in 80s (personally reviewed)  Labs    CBC Recent Labs    10/28/19 1650 10/28/19 1652 10/28/19 2330 10/29/19 0429  WBC 6.5  --   --  7.2  HGB 9.2*   < > 9.2* 9.9*  8.8*  HCT 27.8*   < > 27.0* 29.8*  26.0*  MCV 87.4  --   --  88.4  PLT 136*  --   --  135*   < > = values in this interval not displayed.   Basic Metabolic Panel Recent Labs    10/28/19 0408 10/28/19 0415 10/28/19 1650 10/28/19 1652 10/28/19 2330 10/29/19 0429  NA 141   < > 139   < > 141 140  140  K 3.8   < > 3.9   < > 3.6 3.7  3.7  CL 108   < > 104  --   --  104  CO2 24   < > 25  --   --  24  GLUCOSE 110*   < > 94  --   --  91  BUN 6   < > 5*  --   --  8  CREATININE 0.83   < > 0.79  --   --  0.79  CALCIUM 7.8*   < > 8.2*  --   --  8.6*  MG 2.3  --   --   --   --  2.1  PHOS 3.3  --   --   --   --  4.5   < > = values in this interval not displayed.   Liver Function Tests Recent Labs    10/28/19 0408 10/29/19 0429  AST 77* 44*  ALT 59* 51*  ALKPHOS 49 59  BILITOT 0.5 0.9  PROT 4.9* 5.5*  ALBUMIN 2.3* 2.4*   No results for input(s): LIPASE, AMYLASE in the last 72 hours. Cardiac Enzymes No results for input(s): CKTOTAL, CKMB, CKMBINDEX, TROPONINI in the last 72 hours.  BNP: BNP (last 3 results) Recent Labs    10/17/19 0605  BNP 608.8*    ProBNP (last 3 results) No results for input(s): PROBNP in the last 8760 hours.   D-Dimer No results for input(s): DDIMER in the last 72 hours. Hemoglobin A1C No results for input(s): HGBA1C in the last 72 hours. Fasting Lipid Panel No results for input(s): CHOL, HDL, LDLCALC, TRIG, CHOLHDL, LDLDIRECT in the last 72 hours. Thyroid Function Tests No results for input(s): TSH, T4TOTAL, T3FREE, THYROIDAB in the last 72  hours.  Invalid input(s): FREET3  Other results:   Imaging    No results found.   Medications:     Scheduled Medications: . acetaminophen  1,000 mg Oral Q6H   Or  . acetaminophen (TYLENOL) oral liquid 160 mg/5 mL  1,000 mg Per Tube Q6H  . acetaminophen (TYLENOL) oral liquid 160 mg/5 mL  650 mg Per Tube Once   Or  . acetaminophen  650 mg Rectal Once  . aspirin  81 mg Per Tube Daily  . atorvastatin  40 mg Per Tube Daily  . bisacodyl  10 mg Oral Daily   Or  . bisacodyl  10 mg Rectal Daily  . chlorhexidine gluconate (MEDLINE KIT)  15 mL Mouth Rinse BID  . Chlorhexidine Gluconate Cloth  6 each Topical Daily  . clopidogrel  75 mg Per Tube Daily  . docusate  200 mg Per Tube Daily  . feeding supplement (PIVOT 1.5 CAL)  1,000 mL Per Tube Q24H  . folic acid  1 mg Intravenous Daily  . furosemide  40 mg Intravenous Q12H  . insulin aspart  2-6 Units Subcutaneous Q4H  . ketotifen  1 drop Both Eyes BID  . mouth rinse  15 mL Mouth Rinse 10 times per day  . metoCLOPramide (REGLAN) injection  10 mg Intravenous Q8H  . pantoprazole (PROTONIX) IV  40 mg Intravenous QHS  . potassium chloride  40 mEq Per Tube Once  . rocuronium  100 mg Intravenous Once  . sodium chloride flush  10-40 mL Intracatheter Q12H  . sodium chloride flush  3 mL Intravenous Q12H    Infusions: . sodium chloride    . sodium chloride Stopped (10/28/19 0227)  . sodium chloride    . sodium chloride 10 mL/hr at 10/28/19 0500  . sodium chloride 10 mL/hr at 10/29/19 0700  . ceFEPime (MAXIPIME) IV Stopped (10/29/19 0509)  . dexmedetomidine (PRECEDEX) IV infusion 0.7 mcg/kg/hr (10/29/19 0700)  . epinephrine 1 mcg/min (10/29/19 0700)  . fentaNYL infusion INTRAVENOUS 50 mcg/hr (10/29/19 0700)  . heparin 1,600 Units/hr (10/29/19  4103)  . HYDROmorphone 4 mg/hr (10/29/19 0700)  . lactated ringers Stopped (10/28/19 0624)  . lactated ringers    . midazolam 5 mg/hr (10/29/19 0700)  . milrinone 0.375 mcg/kg/min (10/29/19  0700)  . norepinephrine (LEVOPHED) Adult infusion 3 mcg/min (10/29/19 0700)  . thiamine injection Stopped (10/28/19 0934)  . vancomycin Stopped (10/29/19 0131)  . vasopressin (PITRESSIN) infusion - *FOR SHOCK* Stopped (10/26/19 1709)    PRN Medications: sodium chloride, sodium chloride, HYDROmorphone, midazolam, ondansetron (ZOFRAN) IV, ondansetron (ZOFRAN) IV, rocuronium, sodium chloride flush, sodium chloride flush   Assessment/Plan   1. Cardiogenic shock: Pre-OR echo with EF 45%, apical hypokinesis.  TEE in OR after VF arrest showed severe biventricular dysfunction. Now on ECMO. Currently stable parameters with good flow, pulsatile on arterial line.  Lactate normal, good co-ox.  He remains on epinephrine 1, milrinone 0.375, NE 3. MAP stable, CVP 11 with I/Os markedly negative yesterday on IV Lasix. LDH trending down. Creatinine stable. Will plan to rest on ECMO for now, allow LV/RV time to recover from VF arrest/stunning. Echo on 6/3 with EF < 20%, RV severely dysfunctional.  - Heparin to goal heparin level 0.3-0.5, increase today.   - Should be able to wean off NE today.   - Echo today => RV looks better, mild hypokinesis.  LV EF still low 20-25% range but more lateral wall movement.  With RV improvement, may be able to wean to Impella 5.5.  - Lasix 20 mg IV this afternoon.  2. Pericardial tamponade: Due to LAD perforation.  Now resolved in OR.  3. CAD: S/p DES to distal RCA on 5/25.  Has known CTO OM.  Had attempted intervention on CTO LAD today that was complicated by LAD perforation, now s/p LAD repair.  The LAD was not graftable, LAD territory therefore is not perfused except by pre-existing collaterals. Chest remains open. - Continue ASA 81.  - Continue Plavix 75 mg daily.  - Continue statin, Crestor 20.   4. ETOH abuse: Watch for withdrawal.   5. Smoking: Will need to quit.  6. Acute hypoxemic respiratory failure: In setting of cardiac surgery/ECMO.  Vent with FiO2 0.3, sweep 4.  Stable ABG.   - Per CCM.  - Vancomycin/cefepime started for ECMO prophylaxis.  7. Neuro: Will wake up and move all extremities.  8. Nutrition: Trickle tube feeds.  9. VF arrest: In OR.  No further arrhythmias.  - Off lidocaine.   CRITICAL CARE Performed by: Loralie Champagne  Total critical care time: 40 minutes  Critical care time was exclusive of separately billable procedures and treating other patients.  Critical care was necessary to treat or prevent imminent or life-threatening deterioration.  Critical care was time spent personally by me on the following activities: development of treatment plan with patient and/or surrogate as well as nursing, discussions with consultants, evaluation of patient's response to treatment, examination of patient, obtaining history from patient or surrogate, ordering and performing treatments and interventions, ordering and review of laboratory studies, ordering and review of radiographic studies, pulse oximetry and re-evaluation of patient's condition.   Length of Stay: 4  Loralie Champagne, MD  10/29/2019, 7:48 AM  Advanced Heart Failure Team Pager [phone] (M-F; 7a - 4p)  Please contact [facility] for night-coverage after hours (4p -7a ) and weekends on [email or web address]

## 2019-10-29 NOTE — Progress Notes (Signed)
ANTICOAGULATION CONSULT NOTE   Pharmacy Consult for IV Heparin Indication: ECMO  No Known Allergies  Patient Measurements: Height: 5' 8.5" (174 cm) Weight: 87.8 kg (193 lb 9 oz) IBW/kg (Calculated) : 69.55 Heparin Dosing Weight: 83.9 kg  Vital Signs: Temp: 97.5 F (36.4 C) (06/06 1400) Temp Source: Core (06/06 1200) BP: 98/75 (06/06 0745) Pulse Rate: 79 (06/06 1400)  Labs: Recent Labs    10/27/19 0351 10/27/19 0358 10/28/19 0408 10/28/19 0415 10/28/19 1054 10/28/19 1157 10/28/19 1650 10/28/19 1652 10/29/19 0429 10/29/19 0429 10/29/19 0808 10/29/19 1230 10/29/19 1335  HGB 8.3*   < > 8.7*   < >  --    < > 9.2*   < > 9.9*  8.8*   < > 8.8* 8.5*  --   HCT 24.8*   < > 26.6*   < >  --    < > 27.8*   < > 29.8*  26.0*  --  26.0* 25.0*  --   PLT 145*   < > 137*  --   --   --  136*  --  135*  --   --   --   --   APTT 71*   < > 74*  --   --   --  78*  --  100*  --   --   --   --   LABPROT 15.4*  --  14.6  --   --   --   --   --  14.0  --   --   --   --   INR 1.3*  --  1.2  --   --   --   --   --  1.1  --   --   --   --   HEPARINUNFRC 0.15*   < > 0.15*  --   --    < > 0.19*  --  0.27*  --   --   --  0.33  CREATININE 0.81   < > 0.83  --   --   --  0.79  --  0.79  --   --   --   --   TROPONINIHS  --   --   --   --  14,199*  --   --   --   --   --   --   --   --    < > = values in this interval not displayed.    Estimated Creatinine Clearance: 124.2 mL/min (by C-G formula based on SCr of 0.79 mg/dL).   Medical History: Past Medical History:  Diagnosis Date  . Alcohol abuse   . GERD (gastroesophageal reflux disease)   . Tobacco abuse     Assessment: 49 year old male initiated on [place] ECMO. Pharmacy dosing heparin -heparin level= 0.33 -Hg= 9.9, Pltc 135  No overt bleeding or complications noted.  Discussed with Dr. Aundra Dubin - will change heparin level goal to usual ECMO range of 0.3-0.5.  Goal of Therapy:  Heparin Level goal 0.3-0.5 Monitor platelets by  anticoagulation protocol: Yes   Plan:  -Continue IV heparin 1600 units/hr -Heparin level and CBC every 12 hours (5am/5pm)  [place] Crane, Vena Austria, BCPS, BCCP Clinical Pharmacist  10/29/2019 2:15 PM   [facility] pharmacy phone numbers are listed on [email or web address]

## 2019-10-29 NOTE — Progress Notes (Signed)
4 Days Post-Op Procedure(s) (LRB): TRANSESOPHAGEAL ECHOCARDIOGRAM (TEE) (N/A) Median Sternotomy. Cannulation For Ecmo (Extracorporeal Membrane Oxygenation) @ 4081 Application Of Wound Vac Chest Exploration for repair of coronary perforation (repair of LAD) Subjective: Patient remains hemodynamically stable on ECMO with 4.2 L flow Bedside echocardiogram today shows improved RV function, mildly improved LV function. Excellent urine output with negative input output balance Heparin level remains therapeutic on 900 units/h No more delirium on increased sedation with fentanyl drip Plan return to the OR tomorrow for possible ECMO decannulation.  Patient will probably need percutaneous Impella 5.5 in transition to provide further mechanical LV support.  Goal is chest closure tomorrow.  Objective: Vital signs in last 24 hours: Temp:  [97 F (36.1 C)-98.1 F (36.7 C)] 97.3 F (36.3 C) (06/06 0930) Pulse Rate:  [25-82] 79 (06/06 0930) Cardiac Rhythm: Atrial paced (06/06 0800) Resp:  [0-22] 13 (06/06 0930) BP: (89-159)/(74-100) 98/75 (06/06 0745) SpO2:  [78 %-100 %] 100 % (06/06 0930) Arterial Line BP: (78-138)/(63-91) 78/63 (06/06 0930) FiO2 (%):  [30 %] 30 % (06/06 0800) Weight:  [87.8 kg] 87.8 kg (06/06 0500)  Hemodynamic parameters for last 24 hours: PAP: (14-30)/(9-22) 19/11 CVP:  [7 mmHg-19 mmHg] 11 mmHg  Intake/Output from previous day: 06/05 0701 - 06/06 0700 In: 3381.3 [I.V.:1883.1; NG/GT:548; IV Piggyback:950.3] Out: 4481 [Urine:6105; Emesis/NG output:2000; Chest Tube:110] Intake/Output this shift: Total I/O In: 290 [I.V.:170; NG/GT:120] Out: 500 [Urine:200; Emesis/NG output:300]       Exam    General-sedated and comfortable    Neck- no JVD, no cervical adenopathy palpable, no carotid bruit   Lungs- clear without rales, wheezes.  Minimal chest tube output.  Wound VAC in place.   Cor- regular rate and rhythm, no murmur , gallop   Abdomen- soft, non-tender   Extremities  - warm, non-tender, minimal edema   Neuro- oriented, appropriate, no focal weakness   Lab Results: Recent Labs    10/28/19 1650 10/28/19 1652 10/28/19 2330 10/29/19 0429  WBC 6.5  --   --  7.2  HGB 9.2*   < > 9.2* 9.9*  8.8*  HCT 27.8*   < > 27.0* 29.8*  26.0*  PLT 136*  --   --  135*   < > = values in this interval not displayed.   BMET:  Recent Labs    10/28/19 1650 10/28/19 1652 10/28/19 2330 10/29/19 0429  NA 139   < > 141 140  140  K 3.9   < > 3.6 3.7  3.7  CL 104  --   --  104  CO2 25  --   --  24  GLUCOSE 94  --   --  91  BUN 5*  --   --  8  CREATININE 0.79  --   --  0.79  CALCIUM 8.2*  --   --  8.6*   < > = values in this interval not displayed.    PT/INR:  Recent Labs    10/29/19 0429  LABPROT 14.0  INR 1.1   ABG    Component Value Date/Time   PHART 7.466 (H) 10/29/2019 0429   HCO3 29.7 (H) 10/29/2019 0429   TCO2 31 10/29/2019 0429   O2SAT 70.5 10/29/2019 0429   O2SAT 100.0 10/29/2019 0429   CBG (last 3)  Recent Labs    10/28/19 2328 10/29/19 0427 10/29/19 0755  GLUCAP 93 83 87    Assessment/Plan: S/P Procedure(s) (LRB): TRANSESOPHAGEAL ECHOCARDIOGRAM (TEE) (N/A) Median Sternotomy. Cannulation For Ecmo (Extracorporeal Membrane  Oxygenation) @ 1833 Application Of Wound Vac Chest Exploration for repair of coronary perforation (repair of LAD) Patient showing improvement in biventricular function,  right greater than left.  Goal is to wean off ECMO and decannulate with probable transition to percutaneous Impella 5.5 LVAD for further mechanical support with chest closure.  Procedure has been discussed with the patient's mother.   LOS: 4 days    [name] 10/29/2019

## 2019-10-29 NOTE — Progress Notes (Signed)
  Echocardiogram 2D Echocardiogram has been performed.  Merrie Roof F 10/29/2019, 8:57 AM

## 2019-10-30 ENCOUNTER — Encounter (HOSPITAL_COMMUNITY)
Admission: RE | Disposition: A | Discharge status: Home / Self Care | Payer: Self-pay | Source: Home / Self Care | Attending: Cardiothoracic Surgery

## 2019-10-30 ENCOUNTER — Inpatient Hospital Stay (HOSPITAL_COMMUNITY): Payer: BLUE CROSS/BLUE SHIELD

## 2019-10-30 ENCOUNTER — Inpatient Hospital Stay (HOSPITAL_COMMUNITY): Payer: BLUE CROSS/BLUE SHIELD | Admitting: Certified Registered"

## 2019-10-30 DIAGNOSIS — I255 Ischemic cardiomyopathy: Secondary | ICD-10-CM

## 2019-10-30 HISTORY — PX: PLACEMENT OF IMPELLA LEFT VENTRICULAR ASSIST DEVICE: SHX6519

## 2019-10-30 HISTORY — PX: TEE WITHOUT CARDIOVERSION: SHX5443

## 2019-10-30 HISTORY — PX: STERNAL CLOSURE: SHX6203

## 2019-10-30 HISTORY — PX: CANNULATION FOR ECMO (EXTRACORPOREAL MEMBRANE OXYGENATION): SHX6796

## 2019-10-30 LAB — POCT I-STAT, CHEM 8
BUN: 6 mg/dL (ref 6–20)
BUN: 7 mg/dL (ref 6–20)
BUN: 8 mg/dL (ref 6–20)
BUN: 7 mg/dL (ref 6–20)
BUN: 9 mg/dL (ref 6–20)
Calcium, Ion: 1.07 mmol/L — ABNORMAL LOW (ref 1.15–1.40)
Calcium, Ion: 1.94 mmol/L (ref 1.15–1.40)
Calcium, Ion: >2.5 mmol/L (ref 1.15–1.40)
Calcium, Ion: 1.1 mmol/L — ABNORMAL LOW (ref 1.15–1.40)
Calcium, Ion: 1.18 mmol/L (ref 1.15–1.40)
Chloride: 113 mmol/L — ABNORMAL HIGH (ref 98–111)
Chloride: 97 mmol/L — ABNORMAL LOW (ref 98–111)
Chloride: 108 mmol/L (ref 98–111)
Chloride: 104 mmol/L (ref 98–111)
Chloride: 105 mmol/L (ref 98–111)
Creatinine, Ser: 0.9 mg/dL (ref 0.61–1.24)
Creatinine, Ser: 1 mg/dL (ref 0.61–1.24)
Creatinine, Ser: 1 mg/dL (ref 0.61–1.24)
Creatinine, Ser: 0.8 mg/dL (ref 0.61–1.24)
Creatinine, Ser: 0.8 mg/dL (ref 0.61–1.24)
Glucose, Bld: 184 mg/dL — ABNORMAL HIGH (ref 70–99)
Glucose, Bld: 261 mg/dL — ABNORMAL HIGH (ref 70–99)
Glucose, Bld: 162 mg/dL — ABNORMAL HIGH (ref 70–99)
Glucose, Bld: 177 mg/dL — ABNORMAL HIGH (ref 70–99)
Glucose, Bld: 101 mg/dL — ABNORMAL HIGH (ref 70–99)
HCT: 19 % — ABNORMAL LOW (ref 39.0–52.0)
HCT: 34 % — ABNORMAL LOW (ref 39.0–52.0)
HCT: 15 % — ABNORMAL LOW (ref 39.0–52.0)
HCT: 24 % — ABNORMAL LOW (ref 39.0–52.0)
HCT: 20 % — ABNORMAL LOW (ref 39.0–52.0)
Hemoglobin: 11.6 g/dL — ABNORMAL LOW (ref 13.0–17.0)
Hemoglobin: 6.5 g/dL — CL (ref 13.0–17.0)
Hemoglobin: 5.1 g/dL — CL (ref 13.0–17.0)
Hemoglobin: 8.2 g/dL — ABNORMAL LOW (ref 13.0–17.0)
Hemoglobin: 6.8 g/dL — CL (ref 13.0–17.0)
Potassium: 3.1 mmol/L — ABNORMAL LOW (ref 3.5–5.1)
Potassium: 5.7 mmol/L — ABNORMAL HIGH (ref 3.5–5.1)
Potassium: 3.9 mmol/L (ref 3.5–5.1)
Potassium: 2.9 mmol/L — ABNORMAL LOW (ref 3.5–5.1)
Potassium: 3.8 mmol/L (ref 3.5–5.1)
Sodium: 137 mmol/L (ref 135–145)
Sodium: 145 mmol/L (ref 135–145)
Sodium: 140 mmol/L (ref 135–145)
Sodium: 145 mmol/L (ref 135–145)
Sodium: 136 mmol/L (ref 135–145)
TCO2: 16 mmol/L — ABNORMAL LOW (ref 22–32)
TCO2: 28 mmol/L (ref 22–32)
TCO2: 23 mmol/L (ref 22–32)
TCO2: 23 mmol/L (ref 22–32)
TCO2: 29 mmol/L (ref 22–32)

## 2019-10-30 LAB — POCT I-STAT 7, (LYTES, BLD GAS, ICA,H+H)
Acid-Base Excess: 4 mmol/L — ABNORMAL HIGH (ref 0.0–2.0)
Acid-Base Excess: 2 mmol/L (ref 0.0–2.0)
Acid-base deficit: 1 mmol/L (ref 0.0–2.0)
Acid-base deficit: 17 mmol/L — ABNORMAL HIGH (ref 0.0–2.0)
Acid-base deficit: 6 mmol/L — ABNORMAL HIGH (ref 0.0–2.0)
Acid-base deficit: 2 mmol/L (ref 0.0–2.0)
Acid-base deficit: 3 mmol/L — ABNORMAL HIGH (ref 0.0–2.0)
Acid-base deficit: 2 mmol/L (ref 0.0–2.0)
Acid-base deficit: 1 mmol/L (ref 0.0–2.0)
Acid-base deficit: 2 mmol/L (ref 0.0–2.0)
Acid-base deficit: 1 mmol/L (ref 0.0–2.0)
Bicarbonate: 13.3 mmol/L — ABNORMAL LOW (ref 20.0–28.0)
Bicarbonate: 20.6 mmol/L (ref 20.0–28.0)
Bicarbonate: 26.2 mmol/L (ref 20.0–28.0)
Bicarbonate: 23.1 mmol/L (ref 20.0–28.0)
Bicarbonate: 22.6 mmol/L (ref 20.0–28.0)
Bicarbonate: 23.8 mmol/L (ref 20.0–28.0)
Bicarbonate: 27.4 mmol/L (ref 20.0–28.0)
Bicarbonate: 26.5 mmol/L (ref 20.0–28.0)
Bicarbonate: 23.2 mmol/L (ref 20.0–28.0)
Bicarbonate: 23.3 mmol/L (ref 20.0–28.0)
Bicarbonate: 24.2 mmol/L (ref 20.0–28.0)
Calcium, Ion: 1.09 mmol/L — ABNORMAL LOW (ref 1.15–1.40)
Calcium, Ion: 1.1 mmol/L — ABNORMAL LOW (ref 1.15–1.40)
Calcium, Ion: 1.94 mmol/L (ref 1.15–1.40)
Calcium, Ion: 1.06 mmol/L — ABNORMAL LOW (ref 1.15–1.40)
Calcium, Ion: 1.13 mmol/L — ABNORMAL LOW (ref 1.15–1.40)
Calcium, Ion: 1.03 mmol/L — ABNORMAL LOW (ref 1.15–1.40)
Calcium, Ion: 1.21 mmol/L (ref 1.15–1.40)
Calcium, Ion: 1.22 mmol/L (ref 1.15–1.40)
Calcium, Ion: 1.14 mmol/L — ABNORMAL LOW (ref 1.15–1.40)
Calcium, Ion: 1.16 mmol/L (ref 1.15–1.40)
Calcium, Ion: 1.21 mmol/L (ref 1.15–1.40)
HCT: 21 % — ABNORMAL LOW (ref 39.0–52.0)
HCT: 21 % — ABNORMAL LOW (ref 39.0–52.0)
HCT: 38 % — ABNORMAL LOW (ref 39.0–52.0)
HCT: 21 % — ABNORMAL LOW (ref 39.0–52.0)
HCT: 20 % — ABNORMAL LOW (ref 39.0–52.0)
HCT: 26 % — ABNORMAL LOW (ref 39.0–52.0)
HCT: 24 % — ABNORMAL LOW (ref 39.0–52.0)
HCT: 25 % — ABNORMAL LOW (ref 39.0–52.0)
HCT: 23 % — ABNORMAL LOW (ref 39.0–52.0)
HCT: 23 % — ABNORMAL LOW (ref 39.0–52.0)
HCT: 28 % — ABNORMAL LOW (ref 39.0–52.0)
Hemoglobin: 12.9 g/dL — ABNORMAL LOW (ref 13.0–17.0)
Hemoglobin: 7.1 g/dL — ABNORMAL LOW (ref 13.0–17.0)
Hemoglobin: 7.1 g/dL — ABNORMAL LOW (ref 13.0–17.0)
Hemoglobin: 7.1 g/dL — ABNORMAL LOW (ref 13.0–17.0)
Hemoglobin: 6.8 g/dL — CL (ref 13.0–17.0)
Hemoglobin: 8.8 g/dL — ABNORMAL LOW (ref 13.0–17.0)
Hemoglobin: 8.2 g/dL — ABNORMAL LOW (ref 13.0–17.0)
Hemoglobin: 8.5 g/dL — ABNORMAL LOW (ref 13.0–17.0)
Hemoglobin: 7.8 g/dL — ABNORMAL LOW (ref 13.0–17.0)
Hemoglobin: 7.8 g/dL — ABNORMAL LOW (ref 13.0–17.0)
Hemoglobin: 9.5 g/dL — ABNORMAL LOW (ref 13.0–17.0)
O2 Saturation: 100 %
O2 Saturation: 100 %
O2 Saturation: 100 %
O2 Saturation: 100 %
O2 Saturation: 100 %
O2 Saturation: 100 %
O2 Saturation: 100 %
O2 Saturation: 100 %
O2 Saturation: 95 %
O2 Saturation: 94 %
O2 Saturation: 99 %
Patient temperature: 36.9
Patient temperature: 36.1
Patient temperature: 37.2
Potassium: 3.2 mmol/L — ABNORMAL LOW (ref 3.5–5.1)
Potassium: 3.6 mmol/L (ref 3.5–5.1)
Potassium: 5.7 mmol/L — ABNORMAL HIGH (ref 3.5–5.1)
Potassium: 3.7 mmol/L (ref 3.5–5.1)
Potassium: 2.8 mmol/L — ABNORMAL LOW (ref 3.5–5.1)
Potassium: 3 mmol/L — ABNORMAL LOW (ref 3.5–5.1)
Potassium: 4.5 mmol/L (ref 3.5–5.1)
Potassium: 3.9 mmol/L (ref 3.5–5.1)
Potassium: 4 mmol/L (ref 3.5–5.1)
Potassium: 4 mmol/L (ref 3.5–5.1)
Potassium: 4.5 mmol/L (ref 3.5–5.1)
Sodium: 136 mmol/L (ref 135–145)
Sodium: 142 mmol/L (ref 135–145)
Sodium: 146 mmol/L — ABNORMAL HIGH (ref 135–145)
Sodium: 142 mmol/L (ref 135–145)
Sodium: 146 mmol/L — ABNORMAL HIGH (ref 135–145)
Sodium: 147 mmol/L — ABNORMAL HIGH (ref 135–145)
Sodium: 138 mmol/L (ref 135–145)
Sodium: 139 mmol/L (ref 135–145)
Sodium: 139 mmol/L (ref 135–145)
Sodium: 139 mmol/L (ref 135–145)
Sodium: 136 mmol/L (ref 135–145)
TCO2: 15 mmol/L — ABNORMAL LOW (ref 22–32)
TCO2: 22 mmol/L (ref 22–32)
TCO2: 28 mmol/L (ref 22–32)
TCO2: 24 mmol/L (ref 22–32)
TCO2: 24 mmol/L (ref 22–32)
TCO2: 25 mmol/L (ref 22–32)
TCO2: 29 mmol/L (ref 22–32)
TCO2: 28 mmol/L (ref 22–32)
TCO2: 24 mmol/L (ref 22–32)
TCO2: 24 mmol/L (ref 22–32)
TCO2: 25 mmol/L (ref 22–32)
pCO2 arterial: 44.1 mmHg (ref 32.0–48.0)
pCO2 arterial: 48 mmHg (ref 32.0–48.0)
pCO2 arterial: 56 mmHg — ABNORMAL HIGH (ref 32.0–48.0)
pCO2 arterial: 38.1 mmHg (ref 32.0–48.0)
pCO2 arterial: 43.7 mmHg (ref 32.0–48.0)
pCO2 arterial: 43.4 mmHg (ref 32.0–48.0)
pCO2 arterial: 36.9 mmHg (ref 32.0–48.0)
pCO2 arterial: 39.2 mmHg (ref 32.0–48.0)
pCO2 arterial: 35.6 mmHg (ref 32.0–48.0)
pCO2 arterial: 37.5 mmHg (ref 32.0–48.0)
pCO2 arterial: 42.2 mmHg (ref 32.0–48.0)
pH, Arterial: 7.052 — CL (ref 7.350–7.450)
pH, Arterial: 7.276 — ABNORMAL LOW (ref 7.350–7.450)
pH, Arterial: 7.277 — ABNORMAL LOW (ref 7.350–7.450)
pH, Arterial: 7.389 (ref 7.350–7.450)
pH, Arterial: 7.321 — ABNORMAL LOW (ref 7.350–7.450)
pH, Arterial: 7.347 — ABNORMAL LOW (ref 7.350–7.450)
pH, Arterial: 7.478 — ABNORMAL HIGH (ref 7.350–7.450)
pH, Arterial: 7.439 (ref 7.350–7.450)
pH, Arterial: 7.422 (ref 7.350–7.450)
pH, Arterial: 7.398 (ref 7.350–7.450)
pH, Arterial: 7.367 (ref 7.350–7.450)
pO2, Arterial: 269 mmHg — ABNORMAL HIGH (ref 83.0–108.0)
pO2, Arterial: 340 mmHg — ABNORMAL HIGH (ref 83.0–108.0)
pO2, Arterial: 357 mmHg — ABNORMAL HIGH (ref 83.0–108.0)
pO2, Arterial: 289 mmHg — ABNORMAL HIGH (ref 83.0–108.0)
pO2, Arterial: 494 mmHg — ABNORMAL HIGH (ref 83.0–108.0)
pO2, Arterial: 452 mmHg — ABNORMAL HIGH (ref 83.0–108.0)
pO2, Arterial: 321 mmHg — ABNORMAL HIGH (ref 83.0–108.0)
pO2, Arterial: 402 mmHg — ABNORMAL HIGH (ref 83.0–108.0)
pO2, Arterial: 75 mmHg — ABNORMAL LOW (ref 83.0–108.0)
pO2, Arterial: 69 mmHg — ABNORMAL LOW (ref 83.0–108.0)
pO2, Arterial: 135 mmHg — ABNORMAL HIGH (ref 83.0–108.0)

## 2019-10-30 LAB — CBC
HCT: 28.2 % — ABNORMAL LOW (ref 39.0–52.0)
HCT: 25.3 % — ABNORMAL LOW (ref 39.0–52.0)
HCT: 25.3 % — ABNORMAL LOW (ref 39.0–52.0)
Hemoglobin: 9.1 g/dL — ABNORMAL LOW (ref 13.0–17.0)
Hemoglobin: 8.3 g/dL — ABNORMAL LOW (ref 13.0–17.0)
Hemoglobin: 8.6 g/dL — ABNORMAL LOW (ref 13.0–17.0)
MCH: 28.7 pg (ref 26.0–34.0)
MCH: 28.4 pg (ref 26.0–34.0)
MCH: 29.6 pg (ref 26.0–34.0)
MCHC: 32.3 g/dL (ref 30.0–36.0)
MCHC: 32.8 g/dL (ref 30.0–36.0)
MCHC: 34 g/dL (ref 30.0–36.0)
MCV: 89 fL (ref 80.0–100.0)
MCV: 86.6 fL (ref 80.0–100.0)
MCV: 86.9 fL (ref 80.0–100.0)
Platelets: 124 10*3/uL — ABNORMAL LOW (ref 150–400)
Platelets: 118 10*3/uL — ABNORMAL LOW (ref 150–400)
Platelets: 129 10*3/uL — ABNORMAL LOW (ref 150–400)
RBC: 3.17 MIL/uL — ABNORMAL LOW (ref 4.22–5.81)
RBC: 2.92 MIL/uL — ABNORMAL LOW (ref 4.22–5.81)
RBC: 2.91 MIL/uL — ABNORMAL LOW (ref 4.22–5.81)
RDW: 13.7 % (ref 11.5–15.5)
RDW: 14.5 % (ref 11.5–15.5)
RDW: 14.6 % (ref 11.5–15.5)
WBC: 7.9 10*3/uL (ref 4.0–10.5)
WBC: 8.9 10*3/uL (ref 4.0–10.5)
WBC: 8.7 10*3/uL (ref 4.0–10.5)
nRBC: 0 % (ref 0.0–0.2)
nRBC: 0 % (ref 0.0–0.2)
nRBC: 0 % (ref 0.0–0.2)

## 2019-10-30 LAB — POCT I-STAT EG7
Acid-base deficit: 1 mmol/L (ref 0.0–2.0)
Bicarbonate: 24.5 mmol/L (ref 20.0–28.0)
Calcium, Ion: 1.15 mmol/L (ref 1.15–1.40)
HCT: 24 % — ABNORMAL LOW (ref 39.0–52.0)
Hemoglobin: 8.2 g/dL — ABNORMAL LOW (ref 13.0–17.0)
O2 Saturation: 63 %
Patient temperature: 36.1
Potassium: 4.1 mmol/L (ref 3.5–5.1)
Sodium: 139 mmol/L (ref 135–145)
TCO2: 26 mmol/L (ref 22–32)
pCO2, Ven: 40.8 mmHg — ABNORMAL LOW (ref 44.0–60.0)
pH, Ven: 7.381 (ref 7.250–7.430)
pO2, Ven: 32 mmHg (ref 32.0–45.0)

## 2019-10-30 LAB — COMPREHENSIVE METABOLIC PANEL
ALT: 40 U/L (ref 0–44)
AST: 33 U/L (ref 15–41)
Albumin: 2.3 g/dL — ABNORMAL LOW (ref 3.5–5.0)
Alkaline Phosphatase: 61 U/L (ref 38–126)
Anion gap: 13 (ref 5–15)
BUN: 10 mg/dL (ref 6–20)
CO2: 22 mmol/L (ref 22–32)
Calcium: 8.6 mg/dL — ABNORMAL LOW (ref 8.9–10.3)
Chloride: 105 mmol/L (ref 98–111)
Creatinine, Ser: 0.95 mg/dL (ref 0.61–1.24)
GFR calc Af Amer: >60 mL/min (ref >60)
GFR calc non Af Amer: >60 mL/min (ref >60)
Glucose, Bld: 96 mg/dL (ref 70–99)
Potassium: 4.7 mmol/L (ref 3.5–5.1)
Sodium: 140 mmol/L (ref 135–145)
Total Bilirubin: 1.2 mg/dL (ref 0.3–1.2)
Total Protein: 5.4 g/dL — ABNORMAL LOW (ref 6.5–8.1)

## 2019-10-30 LAB — BASIC METABOLIC PANEL
Anion gap: 10 (ref 5–15)
BUN: 10 mg/dL (ref 6–20)
CO2: 22 mmol/L (ref 22–32)
Calcium: 7.9 mg/dL — ABNORMAL LOW (ref 8.9–10.3)
Chloride: 104 mmol/L (ref 98–111)
Creatinine, Ser: 0.84 mg/dL (ref 0.61–1.24)
GFR calc Af Amer: >60 mL/min (ref >60)
GFR calc non Af Amer: >60 mL/min (ref >60)
Glucose, Bld: 148 mg/dL — ABNORMAL HIGH (ref 70–99)
Potassium: 4.6 mmol/L (ref 3.5–5.1)
Sodium: 136 mmol/L (ref 135–145)

## 2019-10-30 LAB — PHOSPHORUS: Phosphorus: 4.1 mg/dL (ref 2.5–4.6)

## 2019-10-30 LAB — ECHO INTRAOPERATIVE TEE
Height: 68.5 in
Weight: 3061.75 oz

## 2019-10-30 LAB — GLUCOSE, CAPILLARY
Glucose-Capillary: 144 mg/dL — ABNORMAL HIGH (ref 70–99)
Glucose-Capillary: 145 mg/dL — ABNORMAL HIGH (ref 70–99)
Glucose-Capillary: 92 mg/dL (ref 70–99)
Glucose-Capillary: 94 mg/dL (ref 70–99)

## 2019-10-30 LAB — MAGNESIUM: Magnesium: 2.3 mg/dL (ref 1.7–2.4)

## 2019-10-30 LAB — PROTIME-INR
INR: 1.2 (ref 0.8–1.2)
Prothrombin Time: 14.3 seconds (ref 11.4–15.2)

## 2019-10-30 LAB — LACTIC ACID, PLASMA: Lactic Acid, Venous: 0.9 mmol/L (ref 0.5–1.9)

## 2019-10-30 LAB — LACTATE DEHYDROGENASE
LDH: 470 U/L — ABNORMAL HIGH (ref 98–192)
LDH: 381 U/L — ABNORMAL HIGH (ref 98–192)

## 2019-10-30 LAB — COOXEMETRY PANEL
Carboxyhemoglobin: 1.8 % — ABNORMAL HIGH (ref 0.5–1.5)
Methemoglobin: 1.2 % (ref 0.0–1.5)
O2 Saturation: 69.6 %
Total hemoglobin: 9.6 g/dL — ABNORMAL LOW (ref 12.0–16.0)

## 2019-10-30 LAB — FIBRINOGEN
Fibrinogen: >800 mg/dL — ABNORMAL HIGH (ref 210–475)
Fibrinogen: 697 mg/dL — ABNORMAL HIGH (ref 210–475)

## 2019-10-30 LAB — HEPARIN LEVEL (UNFRACTIONATED)
Heparin Unfractionated: 0.25 IU/mL — ABNORMAL LOW (ref 0.30–0.70)
Heparin Unfractionated: <0.1 IU/mL — ABNORMAL LOW (ref 0.30–0.70)

## 2019-10-30 LAB — APTT: aPTT: 82 seconds — ABNORMAL HIGH (ref 24–36)

## 2019-10-30 SURGERY — CANNULATION FOR ECMO (EXTRACORPOREAL MEMBRANE OXYGENATION)
Anesthesia: General | Site: Chest

## 2019-10-30 MED ORDER — LACTATED RINGERS IV SOLN: INTRAVENOUS | Status: DC | PRN

## 2019-10-30 MED ORDER — PHENYLEPHRINE 40 MCG/ML (10ML) SYRINGE FOR IV PUSH (FOR BLOOD PRESSURE SUPPORT)
PREFILLED_SYRINGE | INTRAVENOUS | Status: AC
  Filled 2019-10-30: qty 10

## 2019-10-30 MED ORDER — CLOPIDOGREL BISULFATE 75 MG PO TABS: 75.0000 mg | ORAL_TABLET | Freq: Every day | ORAL | Status: DC

## 2019-10-30 MED ORDER — AMIODARONE HCL IN DEXTROSE 360-4.14 MG/200ML-% IV SOLN
60.0000 mg/h | INTRAVENOUS | Status: AC
  Filled 2019-10-30 (×2): qty 200

## 2019-10-30 MED ORDER — HEPARIN SODIUM (PORCINE) 1000 UNIT/ML IJ SOLN
INTRAMUSCULAR | Status: AC
  Filled 2019-10-30: qty 1

## 2019-10-30 MED ORDER — FUROSEMIDE 10 MG/ML IJ SOLN
40.0000 mg | Freq: Once | INTRAMUSCULAR | Status: AC
  Administered 2019-10-30: 40 mg via INTRAVENOUS
  Filled 2019-10-30: qty 4

## 2019-10-30 MED ORDER — SODIUM CHLORIDE 0.9 % IV SOLN
INTRAVENOUS | Status: DC | PRN
  Administered 2019-10-30: 500 mL

## 2019-10-30 MED ORDER — ROCURONIUM BROMIDE 10 MG/ML (PF) SYRINGE
PREFILLED_SYRINGE | INTRAVENOUS | Status: AC
  Filled 2019-10-30: qty 10

## 2019-10-30 MED ORDER — FENTANYL CITRATE (PF) 250 MCG/5ML IJ SOLN
INTRAMUSCULAR | Status: AC
  Filled 2019-10-30: qty 5

## 2019-10-30 MED ORDER — LIDOCAINE 2% (20 MG/ML) 5 ML SYRINGE
INTRAMUSCULAR | Status: AC
  Filled 2019-10-30: qty 10

## 2019-10-30 MED ORDER — VANCOMYCIN HCL 1000 MG IV SOLR
INTRAVENOUS | Status: DC
  Filled 2019-10-30: qty 1000

## 2019-10-30 MED ORDER — SODIUM CHLORIDE 0.9 % IV SOLN
0.5000 mg/h | INTRAVENOUS | Status: DC
  Administered 2019-10-30: 4 mg/h via INTRAVENOUS
  Administered 2019-10-31: 3 mg/h via INTRAVENOUS
  Administered 2019-11-01 – 2019-11-02 (×3): 4 mg/h via INTRAVENOUS
  Administered 2019-11-02: 2 mg/h via INTRAVENOUS
  Administered 2019-11-02: 1 mg/h via INTRAVENOUS
  Filled 2019-10-30 (×10): qty 5

## 2019-10-30 MED ORDER — HEPARIN SODIUM (PORCINE) 5000 UNIT/ML IJ SOLN
50000.0000 [IU] | INTRAVENOUS | Status: DC
  Administered 2019-10-30: 50000 [IU]
  Filled 2019-10-30: qty 10

## 2019-10-30 MED ORDER — ROCURONIUM BROMIDE 10 MG/ML (PF) SYRINGE
PREFILLED_SYRINGE | INTRAVENOUS | Status: AC
  Filled 2019-10-30: qty 20

## 2019-10-30 MED ORDER — FUROSEMIDE 10 MG/ML IJ SOLN: 20.0000 mg | Freq: Two times a day (BID) | INTRAMUSCULAR | Status: DC

## 2019-10-30 MED ORDER — HEPARIN SODIUM (PORCINE) 5000 UNIT/ML IJ SOLN
50000.0000 [IU] | INTRAVENOUS | Status: DC
  Administered 2019-11-02: 50000 [IU]
  Filled 2019-10-30 (×4): qty 10

## 2019-10-30 MED ORDER — INSULIN ASPART 100 UNIT/ML ~~LOC~~ SOLN
0.0000 [IU] | SUBCUTANEOUS | Status: DC
  Administered 2019-10-30 – 2019-10-31 (×4): 1 [IU] via SUBCUTANEOUS
  Administered 2019-11-01: 2 [IU] via SUBCUTANEOUS
  Administered 2019-11-03 – 2019-11-04 (×4): 1 [IU] via SUBCUTANEOUS
  Administered 2019-11-04: 2 [IU] via SUBCUTANEOUS
  Administered 2019-11-04 – 2019-11-05 (×4): 1 [IU] via SUBCUTANEOUS

## 2019-10-30 MED ORDER — VANCOMYCIN HCL 1000 MG IV SOLR
INTRAVENOUS | Status: DC | PRN
  Administered 2019-10-30: 1000 mL

## 2019-10-30 MED ORDER — LIDOCAINE 2% (20 MG/ML) 5 ML SYRINGE
INTRAMUSCULAR | Status: DC | PRN
  Administered 2019-10-30: 100 mg via INTRAVENOUS

## 2019-10-30 MED ORDER — FENTANYL CITRATE (PF) 250 MCG/5ML IJ SOLN
INTRAMUSCULAR | Status: DC | PRN
  Administered 2019-10-30: 100 ug via INTRAVENOUS
  Administered 2019-10-30 (×2): 200 ug via INTRAVENOUS
  Administered 2019-10-30: 100 ug via INTRAVENOUS
  Administered 2019-10-30: 150 ug via INTRAVENOUS

## 2019-10-30 MED ORDER — LIDOCAINE IN D5W 4-5 MG/ML-% IV SOLN
1.0000 mg/min | INTRAVENOUS | Status: DC
  Administered 2019-10-30: 2 mg/min via INTRAVENOUS
  Filled 2019-10-30: qty 500

## 2019-10-30 MED ORDER — PROPOFOL 10 MG/ML IV BOLUS
INTRAVENOUS | Status: DC | PRN
  Administered 2019-10-30: 40 mg via INTRAVENOUS
  Administered 2019-10-30: 30 mg via INTRAVENOUS
  Administered 2019-10-30: 20 mg via INTRAVENOUS

## 2019-10-30 MED ORDER — NOREPINEPHRINE 16 MG/250ML-% IV SOLN
0.0000 ug/min | INTRAVENOUS | Status: DC
  Administered 2019-10-30: 16 ug/min via INTRAVENOUS
  Administered 2019-10-31: 20 ug/min via INTRAVENOUS
  Administered 2019-11-01: 10 ug/min via INTRAVENOUS
  Filled 2019-10-30 (×3): qty 250

## 2019-10-30 MED ORDER — MIDAZOLAM HCL 5 MG/5ML IJ SOLN
INTRAMUSCULAR | Status: DC | PRN
  Administered 2019-10-30: 3 mg via INTRAVENOUS
  Administered 2019-10-30: 4 mg via INTRAVENOUS
  Administered 2019-10-30: 3 mg via INTRAVENOUS

## 2019-10-30 MED ORDER — HYDROMORPHONE BOLUS VIA INFUSION
1.0000 mg | INTRAVENOUS | Status: DC | PRN
  Administered 2019-10-30 – 2019-11-02 (×4): 1 mg via INTRAVENOUS
  Filled 2019-10-30: qty 1

## 2019-10-30 MED ORDER — SODIUM CHLORIDE 0.9 % IV SOLN
INTRAVENOUS | Status: DC | PRN
  Administered 2019-10-30: 1.5 g via INTRAVENOUS

## 2019-10-30 MED ORDER — ALBUMIN HUMAN 5 % IV SOLN
INTRAVENOUS | Status: DC | PRN
Start: 2019-10-30 — End: 2019-10-30

## 2019-10-30 MED ORDER — ARTIFICIAL TEARS OPHTHALMIC OINT
TOPICAL_OINTMENT | OPHTHALMIC | Status: DC | PRN
  Administered 2019-10-30: 1 via OPHTHALMIC

## 2019-10-30 MED ORDER — PROTAMINE SULFATE 10 MG/ML IV SOLN
INTRAVENOUS | Status: AC
  Filled 2019-10-30: qty 5

## 2019-10-30 MED ORDER — HEMOSTATIC AGENTS (NO CHARGE) OPTIME
TOPICAL | Status: DC | PRN
Start: 2019-10-30 — End: 2019-10-30
  Administered 2019-10-30 (×2): 1 via TOPICAL

## 2019-10-30 MED ORDER — FUROSEMIDE 10 MG/ML IJ SOLN: 60.0000 mg | Freq: Once | INTRAMUSCULAR | Status: DC

## 2019-10-30 MED ORDER — AMIODARONE HCL IN DEXTROSE 360-4.14 MG/200ML-% IV SOLN
30.0000 mg/h | INTRAVENOUS | Status: AC
  Administered 2019-10-30 – 2019-11-06 (×14): 30 mg/h via INTRAVENOUS
  Filled 2019-10-30 (×14): qty 200

## 2019-10-30 MED ORDER — ONDANSETRON HCL 4 MG/2ML IJ SOLN
INTRAMUSCULAR | Status: AC
  Filled 2019-10-30: qty 2

## 2019-10-30 MED ORDER — ROCURONIUM BROMIDE 10 MG/ML (PF) SYRINGE
PREFILLED_SYRINGE | INTRAVENOUS | Status: DC | PRN
  Administered 2019-10-30: 100 mg via INTRAVENOUS
  Administered 2019-10-30: 50 mg via INTRAVENOUS
  Administered 2019-10-30: 100 mg via INTRAVENOUS

## 2019-10-30 MED ORDER — DEXTROSE 5 % SOLN FOR IMPELLA PURGE CATHETER
INTRAVENOUS | Status: DC
  Filled 2019-10-30: qty 1000

## 2019-10-30 MED ORDER — MIDAZOLAM HCL (PF) 10 MG/2ML IJ SOLN
INTRAMUSCULAR | Status: AC
  Filled 2019-10-30: qty 2

## 2019-10-30 MED ORDER — AMIODARONE LOAD VIA INFUSION
150.0000 mg | Freq: Once | INTRAVENOUS | Status: AC
  Administered 2019-10-30: 150 mg via INTRAVENOUS
  Filled 2019-10-30: qty 83.34

## 2019-10-30 MED ORDER — CLOPIDOGREL BISULFATE 75 MG PO TABS
75.0000 mg | ORAL_TABLET | Freq: Every day | ORAL | Status: DC
  Administered 2019-10-30 – 2019-10-31 (×2): 75 mg via ORAL
  Filled 2019-10-30 (×2): qty 1

## 2019-10-30 MED ORDER — PROPOFOL 10 MG/ML IV BOLUS
INTRAVENOUS | Status: AC
  Filled 2019-10-30: qty 20

## 2019-10-30 MED ORDER — SODIUM CHLORIDE 0.9 % IV SOLN
INTRAVENOUS | Status: AC
  Filled 2019-10-30: qty 1.2

## 2019-10-30 MED ORDER — CEFUROXIME SODIUM 1.5 G IV SOLR
1.5000 g | Freq: Once | INTRAVENOUS | Status: DC
  Filled 2019-10-30 (×2): qty 1.5

## 2019-10-30 MED ORDER — DEXAMETHASONE SODIUM PHOSPHATE 10 MG/ML IJ SOLN
INTRAMUSCULAR | Status: AC
  Filled 2019-10-30: qty 1

## 2019-10-30 MED ORDER — PROTAMINE SULFATE 10 MG/ML IV SOLN
INTRAVENOUS | Status: DC | PRN
Start: 2019-10-30 — End: 2019-10-30
  Administered 2019-10-30 (×3): 10 mg via INTRAVENOUS

## 2019-10-30 MED ORDER — FENTANYL CITRATE (PF) 250 MCG/5ML IJ SOLN
INTRAMUSCULAR | Status: AC
  Filled 2019-10-30: qty 10

## 2019-10-30 MED ORDER — PHENYLEPHRINE 40 MCG/ML (10ML) SYRINGE FOR IV PUSH (FOR BLOOD PRESSURE SUPPORT)
PREFILLED_SYRINGE | INTRAVENOUS | Status: DC | PRN
  Administered 2019-10-30: 80 ug via INTRAVENOUS

## 2019-10-30 MED ORDER — VANCOMYCIN HCL 1500 MG/300ML IV SOLN
1500.0000 mg | Freq: Once | INTRAVENOUS | Status: DC
  Filled 2019-10-30: qty 300

## 2019-10-30 MED ORDER — ALBUMIN HUMAN 5 % IV SOLN
INTRAVENOUS | Status: AC
  Filled 2019-10-30: qty 500

## 2019-10-30 MED ORDER — 0.9 % SODIUM CHLORIDE (POUR BTL) OPTIME
TOPICAL | Status: DC | PRN
  Administered 2019-10-30: 3000 mL

## 2019-10-30 SURGICAL SUPPLY — 86 items
AGENT HMST KT MTR STRL THRMB (HEMOSTASIS) ×1
ANCHOR CATH FOLEY SECURE (MISCELLANEOUS) ×12 IMPLANT
BAG DECANTER FOR FLEXI CONT (MISCELLANEOUS) ×7 IMPLANT
BLADE SURG 11 STRL SS (BLADE) ×5 IMPLANT
CATH THORACIC 28FR RT ANG (CATHETERS) ×2 IMPLANT
CATH TROCAR 28FR (CATHETERS) ×2 IMPLANT
CLIP VESOCCLUDE LG 6/CT (CLIP) ×5 IMPLANT
CLIP VESOCCLUDE MED 24/CT (CLIP) ×5 IMPLANT
CLIP VESOCCLUDE SM WIDE 24/CT (CLIP) ×5 IMPLANT
CONN 3/8X3/8 GISH STERILE (MISCELLANEOUS) ×5 IMPLANT
CONN ST 1/4X3/8  BEN (MISCELLANEOUS) ×10
CONN ST 1/4X3/8 BEN (MISCELLANEOUS) IMPLANT
CONN Y 3/8X3/8X3/8  BEN (MISCELLANEOUS) ×10
CONN Y 3/8X3/8X3/8 BEN (MISCELLANEOUS) IMPLANT
COVER BACK TABLE 60X90IN (DRAPES) ×5 IMPLANT
DRAIN CHANNEL 28F RND 3/8 FF (WOUND CARE) ×2 IMPLANT
DRAIN CHANNEL 32F RND 10.7 FF (WOUND CARE) ×2 IMPLANT
DRAPE CARDIOVASC SPLIT 88X140 (DRAPES) ×2 IMPLANT
DRAPE PERI GROIN 82X75IN TIB (DRAPES) ×2 IMPLANT
DRAPE SLUSH/WARMER DISC (DRAPES) ×5 IMPLANT
DRESSING PREVENA PLUS CUSTOM (GAUZE/BANDAGES/DRESSINGS) IMPLANT
DRSG PREVENA PLUS CUSTOM (GAUZE/BANDAGES/DRESSINGS) ×5
DRSG XEROFORM 1X8 (GAUZE/BANDAGES/DRESSINGS) ×2 IMPLANT
ELECT BLADE 4.0 EZ CLEAN MEGAD (MISCELLANEOUS) ×5
ELECT CAUTERY BLADE 6.4 (BLADE) ×5 IMPLANT
ELECT REM PT RETURN 9FT ADLT (ELECTROSURGICAL) ×10
ELECTRODE BLDE 4.0 EZ CLN MEGD (MISCELLANEOUS) ×3 IMPLANT
ELECTRODE REM PT RTRN 9FT ADLT (ELECTROSURGICAL) ×3 IMPLANT
FELT TEFLON 1X6 (MISCELLANEOUS) ×5 IMPLANT
GAUZE SPONGE 4X4 12PLY STRL (GAUZE/BANDAGES/DRESSINGS) ×5 IMPLANT
GLOVE BIO SURGEON STRL SZ 6.5 (GLOVE) ×5 IMPLANT
GLOVE BIO SURGEON STRL SZ7.5 (GLOVE) ×4 IMPLANT
GLOVE BIO SURGEONS STRL SZ 6.5 (GLOVE) ×2
GLOVE BIOGEL PI IND STRL 6.5 (GLOVE) IMPLANT
GLOVE BIOGEL PI INDICATOR 6.5 (GLOVE) ×4
GOWN STRL REUS W/ TWL LRG LVL3 (GOWN DISPOSABLE) ×12 IMPLANT
GOWN STRL REUS W/TWL LRG LVL3 (GOWN DISPOSABLE) ×20
GRAFT HEMASHIELD 10MM (Graft) ×5 IMPLANT
GRAFT VASC STRG 30X10STRL (Graft) IMPLANT
KIT BASIN OR (CUSTOM PROCEDURE TRAY) ×5 IMPLANT
KIT CATH LUMEN 2 8FR 16CM STRL (SET/KITS/TRAYS/PACK) ×5 IMPLANT
KIT CATH LUMEN 2 9FR 11.5CM ST (CATHETERS) ×5 IMPLANT
NDL SUT 4 .5 CRC FRENCH EYE (NEEDLE) IMPLANT
NEEDLE FRENCH EYE (NEEDLE) ×5
NS IRRIG 1000ML POUR BTL (IV SOLUTION) ×7 IMPLANT
PACK CHEST (CUSTOM PROCEDURE TRAY) ×5 IMPLANT
PAD ARMBOARD 7.5X6 YLW CONV (MISCELLANEOUS) ×5 IMPLANT
PUMP SET IMPELLA 5.5 US (CATHETERS) ×2 IMPLANT
SEALANT SURG COSEAL 4ML (VASCULAR PRODUCTS) ×5 IMPLANT
SOL PREP POV-IOD 4OZ 10% (MISCELLANEOUS) ×5 IMPLANT
SOL PREP PROV IODINE SCRUB 4OZ (MISCELLANEOUS) ×5 IMPLANT
SPONGE LAP 18X18 X RAY DECT (DISPOSABLE) ×6 IMPLANT
STAPLER VISISTAT 35W (STAPLE) ×5 IMPLANT
STOPCOCK 4 WAY LG BORE MALE ST (IV SETS) ×2 IMPLANT
SURGIFLO W/THROMBIN 8M KIT (HEMOSTASIS) ×2 IMPLANT
SUT ETHILON 3 0 FSL (SUTURE) ×2 IMPLANT
SUT PROLENE 3 0 SH DA (SUTURE) ×2 IMPLANT
SUT PROLENE 4 0 RB 1 (SUTURE) ×15
SUT PROLENE 4 0 SH DA (SUTURE) ×8 IMPLANT
SUT PROLENE 4-0 RB1 .5 CRCL 36 (SUTURE) IMPLANT
SUT PROLENE 5 0 C 1 36 (SUTURE) ×21 IMPLANT
SUT PROLENE 6 0 C 1 30 (SUTURE) ×5 IMPLANT
SUT SILK  1 MH (SUTURE) ×25
SUT SILK 1 MH (SUTURE) ×18 IMPLANT
SUT SILK 1 TIES 10X30 (SUTURE) ×2 IMPLANT
SUT SILK 2 0 SH CR/8 (SUTURE) ×9 IMPLANT
SUT STEEL 6MS V (SUTURE) ×2 IMPLANT
SUT STEEL SZ 6 DBL 3X14 BALL (SUTURE) ×4 IMPLANT
SUT VIC AB 1 CTX 18 (SUTURE) ×2 IMPLANT
SUT VIC AB 1 CTX 36 (SUTURE) ×10
SUT VIC AB 1 CTX36XBRD ANBCTR (SUTURE) IMPLANT
SUT VIC AB 2-0 CTX 27 (SUTURE) ×4 IMPLANT
SUT VIC AB 3-0 X1 27 (SUTURE) ×4 IMPLANT
SWAB COLLECTION DEVICE MRSA (MISCELLANEOUS) ×2 IMPLANT
SYR 10ML LL (SYRINGE) ×7 IMPLANT
SYR 20ML LL LF (SYRINGE) ×4 IMPLANT
SYR 30ML LL (SYRINGE) ×5 IMPLANT
SYSTEM SAHARA CHEST DRAIN ATS (WOUND CARE) ×4 IMPLANT
TAPE CLOTH SOFT 2X10 (GAUZE/BANDAGES/DRESSINGS) ×2 IMPLANT
TAPE CLOTH SURG 4X10 WHT LF (GAUZE/BANDAGES/DRESSINGS) ×5 IMPLANT
TOWEL GREEN STERILE (TOWEL DISPOSABLE) ×5 IMPLANT
TOWEL GREEN STERILE FF (TOWEL DISPOSABLE) ×5 IMPLANT
TUBE CONNECTING 20'X1/4 (TUBING) ×2
TUBE CONNECTING 20X1/4 (TUBING) ×2 IMPLANT
WATER STERILE IRR 1000ML POUR (IV SOLUTION) ×5 IMPLANT
YANKAUER SUCT BULB TIP NO VENT (SUCTIONS) ×2 IMPLANT

## 2019-10-30 NOTE — Progress Notes (Signed)
ANTICOAGULATION CONSULT NOTE   Pharmacy Consult for IV Heparin Indication: ECMO  No Known Allergies  Patient Measurements: Height: 5' 8.5" (174 cm) Weight: 86.8 kg (191 lb 5.8 oz) IBW/kg (Calculated) : 69.55 Heparin Dosing Weight: 83.9 kg  Vital Signs: Temp: 98.4 F (36.9 C) (06/07 1550) Temp Source: Core (Comment) (06/07 1550) BP: 101/61 (06/07 1400) Pulse Rate: 76 (06/07 1550)  Labs: Recent Labs    10/28/19 0408 10/28/19 0415 10/28/19 1054 10/28/19 1157 10/29/19 0429 10/29/19 0808 10/29/19 1335 10/29/19 1624 10/29/19 1714 10/29/19 1948 10/30/19 0427 10/30/19 0806 10/30/19 0814 10/30/19 0956 10/30/19 1011 10/30/19 1011 10/30/19 1235 10/30/19 1355  HGB 8.7*   < >  --    < > 9.9*  8.8*   < >  --    < > 9.8*   < > 9.1*   < > 6.8*   < > 7.8*   < > 7.8* 8.3*  HCT 26.6*   < >  --    < > 29.8*  26.0*   < >  --    < > 30.1*   < > 28.2*   < > 20.0*   < > 23.0*  --  23.0* 25.3*  PLT 137*  --   --    < > 135*   < >  --   --  127*  --  124*  --   --   --   --   --   --  118*  APTT 74*  --   --    < > 100*  --   --   --  88*  --  82*  --   --   --   --   --   --   --   LABPROT 14.6  --   --   --  14.0  --   --   --   --   --  14.3  --   --   --   --   --   --   --   INR 1.2  --   --   --  1.1  --   --   --   --   --  1.2  --   --   --   --   --   --   --   HEPARINUNFRC 0.15*  --   --    < > 0.27*   < > 0.33  --  0.34  --  0.25*  --   --   --   --   --   --   --   CREATININE 0.83  --   --    < > 0.79   < >  --   --  0.93   < > 0.95  --  0.80  --   --   --   --  0.84  TROPONINIHS  --   --  14,199*  --   --   --   --   --   --   --   --   --   --   --   --   --   --   --    < > = values in this interval not displayed.    Estimated Creatinine Clearance: 117.6 mL/min (by C-G formula based on SCr of 0.84 mg/dL).   Medical History: Past Medical History:  Diagnosis Date  . Alcohol abuse   . GERD (gastroesophageal  reflux disease)   . Tobacco abuse     Assessment: 49 year  old male initiated on [place] ECMO. Pharmacy dosing heparin -heparin level= 0.25 this am slightly < goal -Hg stable 9 and pltc 120s this am no bleeding noted No changes as plan to OR for decannulation > impella   Goal of Therapy:  Heparin Level goal 0.3-0.5 Wait until am rounds to add systemic heparin Monitor platelets by anticoagulation protocol: Yes   Plan:  -Continue IV heparin 1600 units/hr -Heparin level and CBC every 12 hours (5am/5pm)  Bonnita Nasuti Pharm.D. CPP, BCPS Clinical Pharmacist [phone] 10/30/2019 4:33 PM    [facility] pharmacy phone numbers are listed on [email or web address]

## 2019-10-30 NOTE — Significant Event (Addendum)
Unable to maintain MAP goals of 70-80 with current medications. Titrate Levo to max without significant changes to blood pressures. RN switched infusions to PICC as introducer appears to be leaking.   Dressing to introducer changed. BP stabilized, MAP 70s. Dr. Marigene Ehlers made aware when he rounded this afternoon.   William Crawford

## 2019-10-30 NOTE — Progress Notes (Signed)
Bedside report given to perfusionist and circuit evaluated. Emergency meds and equipment prepped and at bedside. Family updated. See chart for cardiohelp data before transport. Pt departed from room at 07:30.

## 2019-10-30 NOTE — Progress Notes (Signed)
ANTICOAGULATION CONSULT NOTE   Pharmacy Consult for IV Heparin Indication: ECMO  No Known Allergies  Patient Measurements: Height: 5' 8.5" (174 cm) Weight: 86.8 kg (191 lb 5.8 oz) IBW/kg (Calculated) : 69.55 Heparin Dosing Weight: 83.9 kg  Vital Signs: Temp: 99 F (37.2 C) (06/07 1700) Temp Source: Core (Comment) (06/07 1550) BP: 103/60 (06/07 1537) Pulse Rate: 75 (06/07 1700)  Labs: Recent Labs    10/28/19 0408 10/28/19 0415 10/28/19 1054 10/28/19 1157 10/29/19 0429 10/29/19 0808 10/29/19 1714 10/29/19 1948 10/30/19 0427 10/30/19 0806 10/30/19 0814 10/30/19 0956 10/30/19 1235 10/30/19 1235 10/30/19 1355 10/30/19 1700 10/30/19 1706  HGB 8.7*   < >  --    < > 9.9*  8.8*   < > 9.8*   < > 9.1*   < > 6.8*   < > 7.8*   < > 8.3*  --  9.5*  HCT 26.6*   < >  --    < > 29.8*  26.0*   < > 30.1*   < > 28.2*   < > 20.0*   < > 23.0*  --  25.3*  --  28.0*  PLT 137*  --   --    < > 135*   < > 127*  --  124*  --   --   --   --   --  118*  --   --   APTT 74*  --   --    < > 100*  --  88*  --  82*  --   --   --   --   --   --   --   --   LABPROT 14.6  --   --   --  14.0  --   --   --  14.3  --   --   --   --   --   --   --   --   INR 1.2  --   --   --  1.1  --   --   --  1.2  --   --   --   --   --   --   --   --   HEPARINUNFRC 0.15*  --   --    < > 0.27*   < > 0.34  --  0.25*  --   --   --   --   --   --  <0.10*  --   CREATININE 0.83  --   --    < > 0.79   < > 0.93   < > 0.95  --  0.80  --   --   --  0.84  --   --   TROPONINIHS  --   --  14,199*  --   --   --   --   --   --   --   --   --   --   --   --   --   --    < > = values in this interval not displayed.    Estimated Creatinine Clearance: 117.6 mL/min (by C-G formula based on SCr of 0.84 mg/dL).   Medical History: Past Medical History:  Diagnosis Date  . Alcohol abuse   . GERD (gastroesophageal reflux disease)   . Tobacco abuse     Assessment: 49 year old male initiated on [place] ECMO. Pharmacy dosing heparin.    On impella 5.5 post decannulation  on 6/7. Plan to continue heparin purge solution for impella overnight and reassess for systemic in AM. Hgb last check was 8.3, plt 118. Recevied 2 PRBCs, 2 FFP, 1 plt in OR. Heparin level tonight on just purge solution is undetectable (purge flow rate 11.9 mL/hr, 595 units/hr).   Goal of Therapy:  Heparin Level goal 0.3-0.5 Wait until am rounds to add systemic heparin Monitor platelets by anticoagulation protocol: Yes   Plan:  -Continue heparin purge solution -F/u with plan for systemic heparin infusion on 6/8  -Heparin level and CBC every 12 hours (5am/5pm)  Antonietta Jewel, PharmD, [place] Clinical Pharmacist  Phone: [phone] 10/30/2019 5:37 PM  Please check AMION for all [place] phone numbers After 10:00 PM, call [place] [phone]

## 2019-10-30 NOTE — Progress Notes (Signed)
Pre Procedure note for inpatients:   William Crawford has been scheduled for Procedure(s) with comments: DECANNULATION OF ECMO (EXTRACORPOREAL MEMBRANE OXYGENATION) (N/A) - Pump standby TRANSESOPHAGEAL ECHOCARDIOGRAM (TEE) (N/A)  Possible Impella 5.5 LVAD placement    The various methods of treatment have been discussed with the patient. After consideration of the risks, benefits and treatment options the patient has consented to the planned procedure.   The patient has been seen and labs reviewed. There are no changes in the patient's condition to prevent proceeding with the planned procedure today.  Recent labs:  Lab Results  Component Value Date   WBC 7.9 10/30/2019   HGB 9.1 (L) 10/30/2019   HCT 28.2 (L) 10/30/2019   PLT 124 (L) 10/30/2019   GLUCOSE 96 10/30/2019   CHOL 193 10/18/2019   TRIG 171 (H) 10/18/2019   HDL 37 (L) 10/18/2019   LDLCALC 122 (H) 10/18/2019   ALT 40 10/30/2019   AST 33 10/30/2019   NA 140 10/30/2019   K 4.7 10/30/2019   CL 105 10/30/2019   CREATININE 0.95 10/30/2019   BUN 10 10/30/2019   CO2 22 10/30/2019   INR 1.2 10/30/2019   HGBA1C 5.2 10/18/2019    Len Childs, MD 10/30/2019 7:35 AM

## 2019-10-30 NOTE — Progress Notes (Signed)
NAME:  William Crawford, MRN:  [id number], DOB:  March 09, 1971, LOS: 5 ADMISSION DATE:  10/25/2019, CONSULTATION DATE:  10/25/19 REFERRING MD:  [place], CHIEF COMPLAINT:  Heart failure  Brief History   49 yo male admitted to ICU on 10/25/19 following CTO PCI to LAD at which time he unfortunately developed a perforated LAD and went into vfib arrest. He subsequently underwent emergent repair however was unable to be removed from bypass following the procedure. PCCM consulted for ECMO.  History of present illness   Pt initially presented with unstable angina on 5/25 at which time he underwent a heart cath which revealed occlusive disease involving the RCA as well as some chronic occlusions involving the first OM branch and proximal LAD. DES placed to the RCA lesion. He was subsequently discharged on DAPT and returned today for stent placement to the LAD.  He, unfortunately, developed a large perforation to the LAD during the procedure subsequently undergoing vfib arrest (time to ROSC 41mn) and was taken for emergent repair.  Past Medical History  Tobacco use, alcohol use, HFrEF, CAD s/p DES placement 5/25  Significant Hospital Events   6/2 LAD perforation during DES placement>Vfib arrest (14 min)>emergent repair>unable to wean bypass>PCCM consulted for ECMO  Consults:  PCCM Cardiology CT surgery  Procedures:  6/2 PCI 6/2 LAD repair 6/2 ECMO  ETT 6/2>> Central line 6/2>>  Significant Diagnostic Tests:  5/25 echocardiogram>>LVEF 35-40% with regional wall motion abnormalities. Severe inferolateral hypokinesis; G1DD. No valvular disease  Micro Data:  n/a  Antimicrobials:  Cefepime 6/2> vanc 6/2>  Interim history/subjective:  No issues overnight.  The seen before going to the OR this morning for decannulation.  Objective   Blood pressure (!) 87/67, pulse (!) 106, temperature 98.1 F (36.7 C), temperature source Core (Comment), resp. rate 15, height 5' 8.5" (1.74 m), weight 86.8 kg,  SpO2 98 %. PAP: (12-32)/(8-21) 32/14 CVP:  [7 mmHg-16 mmHg] 10 mmHg  Vent Mode: PRVC FiO2 (%):  [30 %] 30 % Set Rate:  [10 bmp] 10 bmp Vt Set:  [480 mL] 480 mL PEEP:  [10 cmH20] 10 cmH20 Plateau Pressure:  [20 cmH20-22 cmH20] 21 cmH20   Intake/Output Summary (Last 24 hours) at 10/30/2019 0844 Last data filed at 10/30/2019 0730 Gross per 24 hour  Intake 3725.43 ml  Output 4655 ml  Net -929.57 ml   Filed Weights   10/28/19 0400 10/29/19 0500 10/30/19 0500  Weight: 91 kg 87.8 kg 86.8 kg    Examination: General: Critically ill intubated on mechanical life support, ECMO cannulation HENT: NCAT, ET tube in place Lungs: Bilateral mechanically ventilated breath sounds Cardiovascular: Open chest, wound VAC in place central cannulation for ECMO pacer wires Abdomen: Soft nontender nondistended Extremities: No cyanosis no significant edema Neuro: Awakes to voice, tracks appropriately moves all 4 extremities GU: Foley in place  Chest x-ray reviewed  Resolved Hospital Problem list   Pericardial tamponade--2/2 LAD perforation  Assessment & Plan:   Cardiogenic shock. EF 45% prior to OR. Post Vfib arrest echo suggestive of biventricular failure. Decompensated heart failure s/p thoracotomy for repair of perforation of LAD during PCI. On V-A ECMO since 6/2. Mild stable hemolysis, no signs of circuit clotting. RV with improved contraction Plan  -Discussed management on ECMO rounds this morning with advanced heart failure and cardiothoracic surgery. -Patient plans for back to the OR today for closure of chest [facility] -Continue to wean vasopressors and milrinone -Continue heparin per pharmacy protocol Repeat echocardiogram per cardiology  ICM, acute HFrEF,  CAD, h/o HTN.  NSTEMI s/p DES to RCA 5/25.  Cardiogenic shock; likely distributive shock from sedation contributing Plan -Continue inotropes and vasopressors as needed to maintain mean arterial pressure than  65 -Continue telemetry monitoring  Acute hypoxic respiratory failure requiring mechanical ventilation.  Tobacco use--1ppd smoker Plan:   -Low tidal volume ventilation -Adult ventilator protocol -Pulmonary hygiene- -Wean FiO2 and PEEP as tolerated  High risk alcohol use Risk for ETOH w/d Plan:  -Sedation with fentanyl Versed  High risk malnutrition  -Trickle tube feeds  Hypokalemia Hypophosphatemia-resolved -Continue to monitor  Elevated transaminases likely due to cardiac failure-improving -Continue to monitor  Best practice:  Diet: NPO. Tube feeds Pain/Anxiety/Delirium protocol (if indicated): dilaudid, precedex, versed VAP protocol (if indicated): yes DVT prophylaxis: heparin gtt GI prophylaxis: pepcid Glucose control: per primary Mobility: BR Code Status: Full Family Communication: Mother updated this morning at bedside Disposition: ICU  Labs   CBC: Recent Labs  Lab 10/28/19 0408 10/28/19 0415 10/28/19 1650 10/28/19 1652 10/29/19 0429 10/29/19 0808 10/29/19 1624 10/29/19 1714 10/29/19 1948 10/30/19 0425 10/30/19 0427  WBC 5.8  --  6.5  --  7.2  --   --  7.6  --   --  7.9  HGB 8.7*   < > 9.2*   < > 9.9*  8.8*   < > 8.8* 9.8* 9.2* 8.2* 9.1*  HCT 26.6*   < > 27.8*   < > 29.8*  26.0*   < > 26.0* 30.1* 27.0* 24.0* 28.2*  MCV 88.4  --  87.4  --  88.4  --   --  88.5  --   --  89.0  PLT 137*  --  136*  --  135*  --   --  127*  --   --  124*   < > = values in this interval not displayed.    Basic Metabolic Panel: Recent Labs  Lab 10/27/19 0351 10/27/19 0358 10/27/19 1556 10/27/19 1606 10/28/19 0408 10/28/19 0415 10/28/19 1650 10/28/19 1652 10/29/19 0429 10/29/19 4801 10/29/19 1714 10/29/19 1948 10/29/19 2259 10/30/19 0425 10/30/19 0427  NA 141   < > 140   < > 141   < > 139   < > 140  140   < > 137 140 140 138 140  K 3.4*   < > 2.9*   < > 3.8   < > 3.9   < > 3.7  3.7   < > 3.9 3.9 3.6 4.5 4.7  CL 107   < > 105   < > 108   < > 104  --  104   --  104  --  103  --  105  CO2 25   < > 25   < > 24   < > 25  --  24  --  24  --  23  --  22  GLUCOSE 99   < > 120*   < > 110*   < > 94  --  91  --  99  --  110*  --  96  BUN 6   < > 6   < > 6   < > 5*  --  8  --  7  --  9  --  10  CREATININE 0.81   < > 0.80   < > 0.83   < > 0.79  --  0.79  --  0.93  --  0.93  --  0.95  CALCIUM 7.5*   < > 7.8*   < > 7.8*   < > 8.2*  --  8.6*  --  8.5*  --  8.4*  --  8.6*  MG 1.9   < > 2.0  --  2.3  --   --   --  2.1  --   --   --  2.0  --  2.3  PHOS 2.1*  --  1.1*  --  3.3  --   --   --  4.5  --   --   --   --   --  4.1   < > = values in this interval not displayed.   GFR: Estimated Creatinine Clearance: 104 mL/min (by C-G formula based on SCr of 0.95 mg/dL). Recent Labs  Lab 10/27/19 0351 10/27/19 1556 10/28/19 0408 10/28/19 0408 10/28/19 1650 10/29/19 0429 10/29/19 1714 10/30/19 0427  WBC 6.2   < > 5.8   < > 6.5 7.2 7.6 7.9  LATICACIDVEN 0.7  --  0.8  --   --  1.0  --  0.9   < > = values in this interval not displayed.    Liver Function Tests: Recent Labs  Lab 10/26/19 0907 10/27/19 0351 10/28/19 0408 10/29/19 0429 10/30/19 0427  AST 249* 150* 77* 44* 33  ALT 86* 72* 59* 51* 40  ALKPHOS 37* 42 49 59 61  BILITOT 1.1 0.8 0.5 0.9 1.2  PROT 4.4* 4.4* 4.9* 5.5* 5.4*  ALBUMIN 2.5* 2.4* 2.3* 2.4* 2.3*   No results for input(s): LIPASE, AMYLASE in the last 168 hours. No results for input(s): AMMONIA in the last 168 hours.  ABG    Component Value Date/Time   PHART 7.478 (H) 10/30/2019 0425   PCO2ART 36.9 10/30/2019 0425   PO2ART 321 (H) 10/30/2019 0425   HCO3 27.4 10/30/2019 0425   TCO2 29 10/30/2019 0425   O2SAT 69.6 10/30/2019 0427     Coagulation Profile: Recent Labs  Lab 10/26/19 0305 10/27/19 0351 10/28/19 0408 10/29/19 0429 10/30/19 0427  INR 1.2 1.3* 1.2 1.1 1.2    Cardiac Enzymes: No results for input(s): CKTOTAL, CKMB, CKMBINDEX, TROPONINI in the last 168 hours.  HbA1C: Hgb A1c MFr Bld  Date/Time Value Ref  Range Status  10/18/2019 05:32 AM 5.2 4.8 - 5.6 % Final    Comment:    (NOTE) Pre diabetes:          5.7%-6.4% Diabetes:              >6.4% Glycemic control for   <7.0% adults with diabetes     CBG: Recent Labs  Lab 10/29/19 1113 10/29/19 1539 10/29/19 1947 10/30/19 0000 10/30/19 0423  GLUCAP 91 94 99 92 94    This patient is critically ill with multiple organ system failure; which, requires frequent high complexity decision making, assessment, support, evaluation, and titration of therapies. This was completed through the application of advanced monitoring technologies and extensive interpretation of multiple databases. During this encounter critical care time was devoted to patient care services described in this note for 32 minutes.  Garner Nash, DO [facility] 10/30/2019 8:44 AM

## 2019-10-30 NOTE — Anesthesia Postprocedure Evaluation (Signed)
Anesthesia Post Note  Patient: William Crawford  Procedure(s) Performed: DECANNULATION OF ECMO (EXTRACORPOREAL MEMBRANE OXYGENATION) (N/A ) TRANSESOPHAGEAL ECHOCARDIOGRAM (TEE) (N/A ) Placement Of Impella Left Ventricular Assist Device 5.5 USING 10MM HEMASHIELD PLATINUM GRAFT Sternal Closure (N/A Chest)     Patient location during evaluation: SICU Anesthesia Type: General Level of consciousness: sedated Pain management: pain level controlled Vital Signs Assessment: post-procedure vital signs reviewed and stable Respiratory status: patient remains intubated per anesthesia plan Cardiovascular status: stable Postop Assessment: no apparent nausea or vomiting Anesthetic complications: no    Last Vitals:  Vitals:   10/30/19 1245 10/30/19 1300  BP:    Pulse: 86 84  Resp: 16 (!) 0  Temp: (!) 36.2 C (!) 36.2 C  SpO2: 100% 100%    Last Pain:  Vitals:   10/30/19 1245  TempSrc: Core (Comment)  PainSc:                  Rmani Kellogg DANIEL

## 2019-10-30 NOTE — Brief Op Note (Signed)
10/30/2019  12:14 PM  PATIENT:  William Crawford  49 y.o. male  PRE-OPERATIVE DIAGNOSIS:  Cardiogenic Shock  POST-OPERATIVE DIAGNOSIS:  Cardiogenic Shock  PROCEDURE:  Procedure(s) with comments: DECANNULATION OF ECMO (EXTRACORPOREAL MEMBRANE OXYGENATION) (N/A) - Pump standby TRANSESOPHAGEAL ECHOCARDIOGRAM (TEE) (N/A) Placement Of Impella Left Ventricular Assist Device 5.5 USING 10MM HEMASHIELD PLATINUM GRAFT - graft placed into aorta Sternal Closure (N/A)  SURGEON:  Surgeon(s) and Role:    [name], MD - Primary  PHYSICIAN ASSISTANT:   ASSISTANTS:Chasity RNFA  ANESTHESIA:   general  EBL:  400 mL   BLOOD ADMINISTERED:one unit PRBC CC PRBC  DRAINS: 2 mediastinal tubes, bilateral pleural tubes   LOCAL MEDICATIONS USED:  NONE  SPECIMEN:  Aspirate  DISPOSITION OF SPECIMEN:  culture of mediastinum  COUNTS:  YES  TOURNIQUET: na DICTATION: .Dragon Dictation  PLAN OF CARE: return to ICU  PATIENT DISPOSITION:  ICU - intubated and hemodynamically stable.   Delay start of Pharmacological VTE agent (>24hrs) due to surgical blood loss or risk of bleeding: yes

## 2019-10-30 NOTE — Progress Notes (Signed)
Flow reduced to 1L. Impella flow advanced per impella rep (Adam). Optimization of drips and ventilator per CRNA Claiborne Billings). Arterial and Venous line clamped out. Pt off ECMO at 0941. William Crawford

## 2019-10-30 NOTE — Progress Notes (Signed)
[facility] BRIEF SICU PROGRESS NOTE  Day of Surgery  S/P Procedure(s) (LRB): DECANNULATION OF ECMO (EXTRACORPOREAL MEMBRANE OXYGENATION) (N/A) TRANSESOPHAGEAL ECHOCARDIOGRAM (TEE) (N/A) Placement Of Impella Left Ventricular Assist Device 5.5 USING 10MM HEMASHIELD PLATINUM GRAFT Sternal Closure (N/A)   Sedated on vent AAI paced rhythm Stable hemodynamics and Impella flows UOP adequate Labs okay  Plan: Continue current plan  Rexene Alberts, MD 10/30/2019 5:23 PM

## 2019-10-30 NOTE — Op Note (Signed)
NAME: William Crawford, William Crawford MEDICAL RECORD NU:[id number] ACCOUNT [id number] DATE OF BIRTH:23-Feb-1971 FACILITY: [facility] LOCATION: [facility] PHYSICIAN:Aki Burdin VAN TRIGT III, MD  OPERATIVE REPORT  DATE OF PROCEDURE:  10/30/2019  OPERATION: 1.  Decannulation of venoarterial extracorporeal membrane oxygenation for cardiogenic shock. 2.  Placement of Impella 5.5 left ventricular assist device via 10 mm graft to the ascending aorta. 3.  Sternal closure.  SURGEON:  [name], MD  ASSISTANTCarolyne Fiscal, RNFA  PREOPERATIVE DIAGNOSIS:  Cardiogenic shock following cardiac arrest and tamponade shortly after attempted left anterior descending  percutaneous coronary intervention.   POSTOPERATIVE DIAGNOSIS:  Cardiogenic shock following cardiac arrest and tamponade shortly after attempted left anterior descending  percutaneous coronary intervention.   ANESTHESIA:  General.  CLINICAL NOTE:  The patient is a 49 year old male who had been supported on venoarterial ECMO for the past 5 days.  He had remained stable with good end-organ function of neurologic system, pulmonary function, renal function, and circulatory function.   He was brought back to the operating room because of evidence that the patient's cardiac function had recovered and showed improvement and no longer needed ECMO support.  I discussed the procedure of decannulation of venoarterial ECMO and chest closure  with the patient's mother.  I also discussed the probable need for a temporary percutaneous Impella 5.5 left ventricular assist device.  His most recent echo showed fairly good recovery of the right ventricle and some recovery of the left ventricle, but  probably would require further mechanical support.  The patient's mother understood the risks of bleeding and death.  She agreed to proceed with surgery.  DESCRIPTION OF PROCEDURE:  The patient was brought directly from the ICU to the operating room with the ECMO team present for the  transport and the entire procedure.  The patient was placed carefully on the operating table and general anesthesia was  induced.  A transesophageal echo probe was placed by the anesthesia team.  The previously placed wound VAC and sterile dressings were removed from the chest.  The chest and drains were prepped and draped as a sterile field.  The prep was carried down to  the midthigh level.  A proper time-out was performed.  The Esmarch dressing over the open sternum was removed.  A wound culture of the mediastinum was taken.  A retractor was placed carefully.  The mediastinum was inspected and there was minimal clotted and unclotted blood, which was evacuated.  There was no  evidence of purulence.  The tourniquets around the right atrial cannula and the aortic cannula was carefully freed.  The patient was given extra heparin to maintain ACT greater than 200.  The ECMO flow rates were turned down and the TEE was used to  observe the biventricular function.  RV function appeared to be adequate, but LV function was still at least moderately to severely reduced.  The decision was made to place the Impella 5.5 LVAD through the graft to the ascending aorta with direct entry into the heart.  A partial occlusion clamp was placed in the ascending aorta.  An aortotomy was made and a 10 mm Gelweave graft was sewn  end-to-side using a running 4-0 Prolene.  This was clamped and the partial clamp on the aorta was carefully removed.  There was good hemostasis.  While being clamped, the graft was then placed through the superior mediastinum and through the chest wall  at the right lower neck.  Next, the Impella 5.5 catheter was brought to the field.  The tear-away sheath was placed in the end of the graft.  Through the tear-away sheath, the Impella catheter was passed and then the clamp was removed at the anastomosis with the aorta.  The  catheter was then directly passed to the aortic valve with the position  confirmed by TEE.  The catheter was then positioned in the appropriate depth of the ventricle 5 cm from the aortic valve leaflets.  The catheter was then initiated with flow and we  gradually weaned off ECMO and advanced the flow on the Impella 5.5 catheter.  The patient remained stable off ECMO with the support of the 5.5 Impella.  The cannulas in the right atrium and aorta were then removed and the sites were oversewn with extra pledgeted 4-0 Prolene sutures.  The wound was irrigated with warm antibiotic irrigation.  New chest tubes were placed in the pleural spaces and the anterior and posterior mediastinum.  The superior pericardial fat was closed over the aortic graft.  Next, the sternal wires were placed  and then the sternum was closed and the patient tolerated this well hemodynamically.  The pectoralis fascia was closed with Vicryl.  Subcutaneous and skin layers were also closed.  Staples were used for the skin.  The tear-away sheath was removed and the exposed graft in the neck was trimmed.  Several heavy silk ties were placed around the graft to the catheter, as well as to the sheath, which was inserted into the graft.  This left a very secure connection.  The  small opening in the neck was partially closed with interrupted nylons around the graft.  Sterile dressings were then placed around the neck site and a Prevena wound VAC was placed over the sternal incision.  A followup chest x-ray in the operating room showed a moderate right pleural effusion, which was treated with a 28-French chest tube  placement.  The patient then returned to the ICU intubated, supported with Impella 5.5 LVAD in a stable condition.  Cardiopulmonary bypass was not required for this procedure.  The patient received 1 unit of blood during the procedure.  VN/NUANCE  D:10/30/2019 T:10/30/2019 JOB:[id number]

## 2019-10-30 NOTE — Transfer of Care (Signed)
Immediate Anesthesia Transfer of Care Note  Patient: William Crawford  Procedure(s) Performed: DECANNULATION OF ECMO (EXTRACORPOREAL MEMBRANE OXYGENATION) (N/A ) TRANSESOPHAGEAL ECHOCARDIOGRAM (TEE) (N/A ) Placement Of Impella Left Ventricular Assist Device 5.5 USING 10MM HEMASHIELD PLATINUM GRAFT Sternal Closure (N/A Chest)  Patient Location: PACU  Anesthesia Type:General  Level of Consciousness: sedated and Patient remains intubated per anesthesia plan  Airway & Oxygen Therapy: Patient remains intubated per anesthesia plan and Patient placed on Ventilator (see vital sign flow sheet for setting)  Post-op Assessment: Report given to RN and Post -op Vital signs reviewed and stable  Post vital signs: Reviewed and stable  Last Vitals:  Vitals Value Taken Time  BP    Temp    Pulse    Resp    SpO2      Last Pain:  Vitals:   10/30/19 0730  TempSrc: Core (Comment)  PainSc:          Complications: No apparent anesthesia complications

## 2019-10-30 NOTE — Progress Notes (Signed)
Patient ID: William Crawford, male   DOB: 1971/05/17, 49 y.o.   MRN: [id number]     Advanced Heart Failure Rounding Note  PCP-Cardiologist: No primary care provider on file.   Subjective:    - LAD repair + VA ECMO cannulation in OR 6/3. Chest wall open.  Stable overnight.   Lactate < 1, co-ox 70%.  This morning, he is on epinephrine 1, milrinone 0.375, NE 3.5.  Flow is pulsatile on a-line. I/Os negative with Lasix 20 mg IV x 1. CVP 9.   LDH 662 => 594 => 548 => 514 => 470  ALT 90 => 72 => 51 AST 274 => 150 => 44 hgb 8.2 => 8.3 => 8.7 => 8.9 => 9.1 Creatinine 0.99 => 0.81 => 0.83 => 0.79 => 0.95  ECMO circuit functioning normally, no chugging with good flow.   7.48/37/321  - Echo (6/3): EF < 20%, severe RV dysfunction - Echo (6/6): EF 20-25%, normal RV function  [place]: CVP 9 PA 32/14 Co-ox 71%  ECMO circuit Flow 3.8 L/min  3300 rpm -41 DeltaP 18 Sweep 4  Objective:   Weight Range: 86.8 kg Body mass index is 28.67 kg/m.   Vital Signs:   Temp:  [97.3 F (36.3 C)-98.6 F (37 C)] 98.1 F (36.7 C) (06/07 0730) Pulse Rate:  [78-106] 106 (06/07 0730) Resp:  [0-21] 15 (06/07 0730) BP: (87-98)/(67-75) 87/67 (06/07 0400) SpO2:  [98 %-100 %] 98 % (06/07 0730) Arterial Line BP: (78-210)/(63-207) 146/92 (06/07 0730) FiO2 (%):  [30 %] 30 % (06/07 0730) Weight:  [86.8 kg] 86.8 kg (06/07 0500) Last BM Date: (PTA)  Weight change: Filed Weights   10/28/19 0400 10/29/19 0500 10/30/19 0500  Weight: 91 kg 87.8 kg 86.8 kg    Intake/Output:   Intake/Output Summary (Last 24 hours) at 10/30/2019 0743 Last data filed at 10/30/2019 0725 Gross per 24 hour  Intake 3810.47 ml  Output 4900 ml  Net -1089.53 ml      Physical Exam    General: Intubated/sedated Neck: No JVD, no thyromegaly or thyroid nodule.  Lungs: Clear to auscultation bilaterally with normal respiratory effort. CV: Nondisplaced PMI.  Heart regular S1/S2, no S3/S4, no murmur.  Trace ankle edema. Abdomen:  Soft,  no hepatosplenomegaly, no distention.  Skin: Intact without lesions or rashes.  Neurologic: Sedated on vent Extremities: No clubbing or cyanosis.  HEENT: Normal.    Telemetry   A-paced in 80s (personally reviewed)  Labs    CBC Recent Labs    10/29/19 1714 10/29/19 1948 10/30/19 0425 10/30/19 0427  WBC 7.6  --   --  7.9  HGB 9.8*   < > 8.2* 9.1*  HCT 30.1*   < > 24.0* 28.2*  MCV 88.5  --   --  89.0  PLT 127*  --   --  124*   < > = values in this interval not displayed.   Basic Metabolic Panel Recent Labs    10/29/19 0429 10/29/19 0808 10/29/19 2259 10/29/19 2259 10/30/19 0425 10/30/19 0427  NA 140  140   < > 140   < > 138 140  K 3.7  3.7   < > 3.6   < > 4.5 4.7  CL 104   < > 103  --   --  105  CO2 24   < > 23  --   --  22  GLUCOSE 91   < > 110*  --   --  96  BUN 8   < >  9  --   --  10  CREATININE 0.79   < > 0.93  --   --  0.95  CALCIUM 8.6*   < > 8.4*  --   --  8.6*  MG 2.1   < > 2.0  --   --  2.3  PHOS 4.5  --   --   --   --  4.1   < > = values in this interval not displayed.   Liver Function Tests Recent Labs    10/29/19 0429 10/30/19 0427  AST 44* 33  ALT 51* 40  ALKPHOS 59 61  BILITOT 0.9 1.2  PROT 5.5* 5.4*  ALBUMIN 2.4* 2.3*   No results for input(s): LIPASE, AMYLASE in the last 72 hours. Cardiac Enzymes No results for input(s): CKTOTAL, CKMB, CKMBINDEX, TROPONINI in the last 72 hours.  BNP: BNP (last 3 results) Recent Labs    10/17/19 0605  BNP 608.8*    ProBNP (last 3 results) No results for input(s): PROBNP in the last 8760 hours.   D-Dimer No results for input(s): DDIMER in the last 72 hours. Hemoglobin A1C No results for input(s): HGBA1C in the last 72 hours. Fasting Lipid Panel No results for input(s): CHOL, HDL, LDLCALC, TRIG, CHOLHDL, LDLDIRECT in the last 72 hours. Thyroid Function Tests No results for input(s): TSH, T4TOTAL, T3FREE, THYROIDAB in the last 72 hours.  Invalid input(s): FREET3  Other  results:   Imaging    ECHOCARDIOGRAM LIMITED  Result Date: 10/29/2019    ECHOCARDIOGRAM LIMITED REPORT   Patient Name:   William Crawford Date of Exam: 10/29/2019 Medical Rec #:  [id number]         Height:       68.5 in Accession #:    [id number]        Weight:       193.6 lb Date of Birth:  08/10/1970         BSA:          2.027 m Patient Age:    17 years          BP:           98/75 mmHg Patient Gender: M                 HR:           80 bpm. Exam Location:  Inpatient Procedure: 2D Echo, Cardiac Doppler and Color Doppler Indications:    N27.78 Acute systolic (congestive) heart failure  History:        Patient has prior history of Echocardiogram examinations, most                 recent 10/26/2019. LV DysFx, s/p Cardiac Surgery.  Sonographer:    Merrie Roof RDCS Referring Phys: [place] Comments: This was a limited echo to assess RV/LV function and adjust RPMs on ECMO machine. IMPRESSIONS  1. Ramp test. Left ventricular ejection fraction, by estimation, is 20 to 25%. The left ventricle has severely decreased function. The left ventricle has no regional wall motion abnormalities.  2. Right ventricular systolic function is normal. The right ventricular size is normal.  3. The mitral valve is normal in structure.  4. The aortic valve is normal in structure. FINDINGS  Left Ventricle: Ramp test. Left ventricular ejection fraction, by estimation, is 20 to 25%. The left ventricle has severely decreased function. The left ventricle has no regional wall motion abnormalities. The left ventricular internal  cavity size was normal in size. There is no left ventricular hypertrophy. Right Ventricle: The right ventricular size is normal. No increase in right ventricular wall thickness. Right ventricular systolic function is normal. Left Atrium: Left atrial size was normal in size. Right Atrium: Right atrial size was normal in size. Pericardium: There is no evidence of pericardial effusion. Mitral Valve:  The mitral valve is normal in structure. Normal mobility of the mitral valve leaflets. Tricuspid Valve: The tricuspid valve is normal in structure. Aortic Valve: The aortic valve is normal in structure. Pulmonic Valve: The pulmonic valve was not assessed. IAS/Shunts: No atrial level shunt detected by color flow Doppler.   LV Volumes (MOD) LV vol d, MOD A2C: 102.8 ml LV vol d, MOD A4C: 80.3 ml LV vol s, MOD A2C: 79.4 ml LV vol s, MOD A4C: 52.1 ml LV SV MOD A2C:     23.4 ml LV SV MOD A4C:     80.3 ml LV SV MOD BP:      25.6 ml Candee Furbish MD Electronically signed by Candee Furbish MD Signature Date/Time: 10/29/2019/11:46:03 AM    Final      Medications:     Scheduled Medications: . [MAR Hold] acetaminophen  1,000 mg Oral Q6H   Or  . [MAR Hold] acetaminophen (TYLENOL) oral liquid 160 mg/5 mL  1,000 mg Per Tube Q6H  . [MAR Hold] acetaminophen (TYLENOL) oral liquid 160 mg/5 mL  650 mg Per Tube Once   Or  . [MAR Hold] acetaminophen  650 mg Rectal Once  . [MAR Hold] aspirin  81 mg Per Tube Daily  . [MAR Hold] atorvastatin  40 mg Per Tube Daily  . [MAR Hold] bisacodyl  10 mg Oral Daily   Or  . [MAR Hold] bisacodyl  10 mg Rectal Daily  . cefUROXime  1.5 g Intravenous Once  . [MAR Hold] chlorhexidine gluconate (MEDLINE KIT)  15 mL Mouth Rinse BID  . [MAR Hold] Chlorhexidine Gluconate Cloth  6 each Topical Daily  . [MAR Hold] docusate  200 mg Per Tube Daily  . [MAR Hold] feeding supplement (PIVOT 1.5 CAL)  1,000 mL Per Tube Q24H  . [MAR Hold] folic acid  1 mg Intravenous Daily  . [MAR Hold] insulin aspart  2-6 Units Subcutaneous Q4H  . [MAR Hold] ketotifen  1 drop Both Eyes BID  . [MAR Hold] mouth rinse  15 mL Mouth Rinse 10 times per day  . [MAR Hold] metoCLOPramide (REGLAN) injection  10 mg Intravenous Q8H  . [MAR Hold] pantoprazole (PROTONIX) IV  40 mg Intravenous QHS  . [MAR Hold] rocuronium  100 mg Intravenous Once  . [MAR Hold] sodium chloride flush  10-40 mL Intracatheter Q12H  . [MAR Hold]  sodium chloride flush  3 mL Intravenous Q12H    Infusions: . sodium chloride    . sodium chloride Stopped (10/28/19 0227)  . sodium chloride    . sodium chloride 10 mL/hr at 10/28/19 0500  . [MAR Hold] sodium chloride 10 mL/hr at 10/30/19 0700  .  ceFAZolin (ANCEF) IV    . [MAR Hold] ceFEPime (MAXIPIME) IV Stopped (10/30/19 0447)  . dexmedetomidine (PRECEDEX) IV infusion 0.7 mcg/kg/hr (10/30/19 0700)  . [MAR Hold] epinephrine 1 mcg/min (10/30/19 0700)  . fentaNYL infusion INTRAVENOUS Stopped (10/30/19 0259)  . impella catheter heparin 50 unit/mL in dextrose 5%    . heparin Stopped (10/30/19 0725)  . HYDROmorphone 4 mg/hr (10/30/19 0700)  . lactated ringers Stopped (10/28/19 0624)  . lactated ringers    .  midazolam 5 mg/hr (10/30/19 0700)  . milrinone 0.375 mcg/kg/min (10/30/19 0700)  . [MAR Hold] norepinephrine (LEVOPHED) Adult infusion 3.5 mcg/min (10/30/19 0700)  . [MAR Hold] thiamine injection Stopped (10/29/19 0942)  . [MAR Hold] vancomycin Stopped (10/30/19 0612)  . vancomycin    . vasopressin (PITRESSIN) infusion - *FOR SHOCK* Stopped (10/26/19 1709)    PRN Medications: sodium chloride, [MAR Hold] sodium chloride, [MAR Hold] HYDROmorphone, [MAR Hold] midazolam, [MAR Hold] ondansetron (ZOFRAN) IV, [MAR Hold] ondansetron (ZOFRAN) IV, [MAR Hold] rocuronium, [MAR Hold] sodium chloride flush, [MAR Hold] sodium chloride flush   Assessment/Plan   1. Cardiogenic shock: Pre-OR echo with EF 45%, apical hypokinesis.  TEE in OR after VF arrest showed severe biventricular dysfunction. Now on ECMO. Currently stable parameters with good flow, pulsatile on arterial line.  Lactate normal, good co-ox.  He remains on epinephrine 1, milrinone 0.375, NE 3.5. MAP stable, CVP 9 with I/Os negative yesterday with 1 dose IV Lasix. LDH trending down. Creatinine stable. Repeat echo 6/6 with EF 20-25%, RV function improved/near normal.   - To OR this morning for ECMO decannulation and Impella 5.5  placement with improved RV function. 2. Pericardial tamponade: Due to LAD perforation.  Now resolved in OR.  3. CAD: S/p DES to distal RCA on 5/25.  Has known CTO OM.  Had attempted intervention on CTO LAD today that was complicated by LAD perforation, now s/p LAD repair.  The LAD was not graftable, LAD territory therefore is not perfused except by pre-existing collaterals. Chest remains open. - Continue ASA 81.  - Continue Plavix 75 mg daily.  - Continue statin, Crestor 20.   4. ETOH abuse: Watch for withdrawal.   5. Smoking: Will need to quit.  6. Acute hypoxemic respiratory failure: In setting of cardiac surgery/ECMO.  Vent with FiO2 0.3, sweep 4. Stable ABG.   - Per CCM.  - Vancomycin/cefepime started for ECMO prophylaxis.  7. Neuro: Will wake up and move all extremities.  8. Nutrition: Trickle tube feeds.  9. VF arrest: In OR.  No further arrhythmias.  - Off lidocaine.   CRITICAL CARE Performed by: Loralie Champagne  Total critical care time: 40 minutes  Critical care time was exclusive of separately billable procedures and treating other patients.  Critical care was necessary to treat or prevent imminent or life-threatening deterioration.  Critical care was time spent personally by me on the following activities: development of treatment plan with patient and/or surrogate as well as nursing, discussions with consultants, evaluation of patient's response to treatment, examination of patient, obtaining history from patient or surrogate, ordering and performing treatments and interventions, ordering and review of laboratory studies, ordering and review of radiographic studies, pulse oximetry and re-evaluation of patient's condition.   Length of Stay: [place], MD  10/30/2019, 7:43 AM  Advanced Heart Failure Team Pager [phone] (M-F; 7a - 4p)  Please contact [facility] for night-coverage after hours (4p -7a ) and weekends on [email or web address]

## 2019-10-30 NOTE — Progress Notes (Signed)
°  Echocardiogram Echocardiogram Transesophageal has been performed.  William Crawford 10/30/2019, 9:15 AM

## 2019-10-31 ENCOUNTER — Inpatient Hospital Stay (HOSPITAL_COMMUNITY): Payer: BLUE CROSS/BLUE SHIELD

## 2019-10-31 ENCOUNTER — Encounter: Payer: Self-pay | Admitting: *Deleted

## 2019-10-31 DIAGNOSIS — Z95811 Presence of heart assist device: Secondary | ICD-10-CM

## 2019-10-31 DIAGNOSIS — I5021 Acute systolic (congestive) heart failure: Secondary | ICD-10-CM

## 2019-10-31 DIAGNOSIS — J9601 Acute respiratory failure with hypoxia: Secondary | ICD-10-CM

## 2019-10-31 DIAGNOSIS — Z9889 Other specified postprocedural states: Secondary | ICD-10-CM

## 2019-10-31 DIAGNOSIS — Z978 Presence of other specified devices: Secondary | ICD-10-CM

## 2019-10-31 LAB — BASIC METABOLIC PANEL
Anion gap: 10 (ref 5–15)
Anion gap: 9 (ref 5–15)
BUN: 8 mg/dL (ref 6–20)
BUN: 8 mg/dL (ref 6–20)
CO2: 22 mmol/L (ref 22–32)
CO2: 24 mmol/L (ref 22–32)
Calcium: 7.8 mg/dL — ABNORMAL LOW (ref 8.9–10.3)
Calcium: 7.8 mg/dL — ABNORMAL LOW (ref 8.9–10.3)
Chloride: 104 mmol/L (ref 98–111)
Chloride: 103 mmol/L (ref 98–111)
Creatinine, Ser: 1.15 mg/dL (ref 0.61–1.24)
Creatinine, Ser: 1.09 mg/dL (ref 0.61–1.24)
GFR calc Af Amer: >60 mL/min (ref >60)
GFR calc Af Amer: >60 mL/min (ref >60)
GFR calc non Af Amer: >60 mL/min (ref >60)
GFR calc non Af Amer: >60 mL/min (ref >60)
Glucose, Bld: 123 mg/dL — ABNORMAL HIGH (ref 70–99)
Glucose, Bld: 130 mg/dL — ABNORMAL HIGH (ref 70–99)
Potassium: 4.2 mmol/L (ref 3.5–5.1)
Potassium: 4.2 mmol/L (ref 3.5–5.1)
Sodium: 136 mmol/L (ref 135–145)
Sodium: 136 mmol/L (ref 135–145)

## 2019-10-31 LAB — PREPARE FRESH FROZEN PLASMA
Unit division: 0
Unit division: 0
Unit division: 0

## 2019-10-31 LAB — HEPATIC FUNCTION PANEL
ALT: 37 U/L (ref 0–44)
AST: 42 U/L — ABNORMAL HIGH (ref 15–41)
Albumin: 2.4 g/dL — ABNORMAL LOW (ref 3.5–5.0)
Alkaline Phosphatase: 55 U/L (ref 38–126)
Bilirubin, Direct: 0.5 mg/dL — ABNORMAL HIGH (ref 0.0–0.2)
Indirect Bilirubin: 0.7 mg/dL (ref 0.3–0.9)
Total Bilirubin: 1.2 mg/dL (ref 0.3–1.2)
Total Protein: 5.4 g/dL — ABNORMAL LOW (ref 6.5–8.1)

## 2019-10-31 LAB — GLUCOSE, CAPILLARY
Glucose-Capillary: 111 mg/dL — ABNORMAL HIGH (ref 70–99)
Glucose-Capillary: 114 mg/dL — ABNORMAL HIGH (ref 70–99)
Glucose-Capillary: 119 mg/dL — ABNORMAL HIGH (ref 70–99)
Glucose-Capillary: 120 mg/dL — ABNORMAL HIGH (ref 70–99)
Glucose-Capillary: 130 mg/dL — ABNORMAL HIGH (ref 70–99)
Glucose-Capillary: 134 mg/dL — ABNORMAL HIGH (ref 70–99)
Glucose-Capillary: 134 mg/dL — ABNORMAL HIGH (ref 70–99)

## 2019-10-31 LAB — CBC
HCT: 27.4 % — ABNORMAL LOW (ref 39.0–52.0)
HCT: 26.2 % — ABNORMAL LOW (ref 39.0–52.0)
Hemoglobin: 9.2 g/dL — ABNORMAL LOW (ref 13.0–17.0)
Hemoglobin: 8.5 g/dL — ABNORMAL LOW (ref 13.0–17.0)
MCH: 29.4 pg (ref 26.0–34.0)
MCH: 28.5 pg (ref 26.0–34.0)
MCHC: 33.6 g/dL (ref 30.0–36.0)
MCHC: 32.4 g/dL (ref 30.0–36.0)
MCV: 87.5 fL (ref 80.0–100.0)
MCV: 87.9 fL (ref 80.0–100.0)
Platelets: 148 10*3/uL — ABNORMAL LOW (ref 150–400)
Platelets: 165 10*3/uL (ref 150–400)
RBC: 3.13 MIL/uL — ABNORMAL LOW (ref 4.22–5.81)
RBC: 2.98 MIL/uL — ABNORMAL LOW (ref 4.22–5.81)
RDW: 15.1 % (ref 11.5–15.5)
RDW: 15.1 % (ref 11.5–15.5)
WBC: 10.9 10*3/uL — ABNORMAL HIGH (ref 4.0–10.5)
WBC: 12.3 10*3/uL — ABNORMAL HIGH (ref 4.0–10.5)
nRBC: 0 % (ref 0.0–0.2)
nRBC: 0.2 % (ref 0.0–0.2)

## 2019-10-31 LAB — BPAM FFP
Blood Product Expiration Date: 202106102359
Blood Product Expiration Date: 202106112359
Blood Product Expiration Date: 202106112359
Blood Product Expiration Date: 202106112359
ISSUE DATE / TIME: 202106070743
ISSUE DATE / TIME: 202106070743
ISSUE DATE / TIME: 202106070743
ISSUE DATE / TIME: 202106070743
Unit Type and Rh: 600
Unit Type and Rh: 6200
Unit Type and Rh: 6200
Unit Type and Rh: 6200

## 2019-10-31 LAB — POCT I-STAT 7, (LYTES, BLD GAS, ICA,H+H)
Acid-base deficit: 1 mmol/L (ref 0.0–2.0)
Bicarbonate: 23.1 mmol/L (ref 20.0–28.0)
Calcium, Ion: 1.13 mmol/L — ABNORMAL LOW (ref 1.15–1.40)
HCT: 23 % — ABNORMAL LOW (ref 39.0–52.0)
Hemoglobin: 7.8 g/dL — ABNORMAL LOW (ref 13.0–17.0)
O2 Saturation: 98 %
Patient temperature: 38.2
Potassium: 4.3 mmol/L (ref 3.5–5.1)
Sodium: 137 mmol/L (ref 135–145)
TCO2: 24 mmol/L (ref 22–32)
pCO2 arterial: 38 mmHg (ref 32.0–48.0)
pH, Arterial: 7.396 (ref 7.350–7.450)
pO2, Arterial: 107 mmHg (ref 83.0–108.0)

## 2019-10-31 LAB — BPAM PLATELET PHERESIS
Blood Product Expiration Date: 202106082359
ISSUE DATE / TIME: 202106071018
Unit Type and Rh: 9500

## 2019-10-31 LAB — LACTIC ACID, PLASMA: Lactic Acid, Venous: 0.9 mmol/L (ref 0.5–1.9)

## 2019-10-31 LAB — COOXEMETRY PANEL
Carboxyhemoglobin: 1.3 % (ref 0.5–1.5)
Methemoglobin: 1.2 % (ref 0.0–1.5)
O2 Saturation: 61.1 %
Total hemoglobin: 9.4 g/dL — ABNORMAL LOW (ref 12.0–16.0)

## 2019-10-31 LAB — PREPARE PLATELET PHERESIS: Unit division: 0

## 2019-10-31 LAB — ECHOCARDIOGRAM LIMITED
Height: 68.5 in
Weight: 3255.75 oz

## 2019-10-31 LAB — PROTIME-INR
INR: 1.1 (ref 0.8–1.2)
Prothrombin Time: 13.8 seconds (ref 11.4–15.2)

## 2019-10-31 LAB — HEPARIN LEVEL (UNFRACTIONATED)
Heparin Unfractionated: <0.1 IU/mL — ABNORMAL LOW (ref 0.30–0.70)
Heparin Unfractionated: <0.1 IU/mL — ABNORMAL LOW (ref 0.30–0.70)

## 2019-10-31 LAB — LACTATE DEHYDROGENASE: LDH: 372 U/L — ABNORMAL HIGH (ref 98–192)

## 2019-10-31 MED ORDER — HEPARIN (PORCINE) 25000 UT/250ML-% IV SOLN: 500.0000 [IU]/h | INTRAVENOUS | Status: DC

## 2019-10-31 MED ORDER — IOHEXOL 300 MG/ML  SOLN
50.0000 mL | Freq: Once | INTRAMUSCULAR | Status: AC | PRN
  Administered 2019-10-31: 28 mL

## 2019-10-31 MED ORDER — VITAL 1.5 CAL PO LIQD
1000.0000 mL | ORAL | Status: DC
  Administered 2019-10-31 – 2019-11-01 (×3): 1000 mL
  Filled 2019-10-31 (×5): qty 1000

## 2019-10-31 MED ORDER — PIPERACILLIN-TAZOBACTAM 3.375 G IVPB: 3.3750 g | Freq: Four times a day (QID) | INTRAVENOUS | Status: DC

## 2019-10-31 MED ORDER — MUPIROCIN CALCIUM 2 % EX CREA
TOPICAL_CREAM | Freq: Two times a day (BID) | CUTANEOUS | Status: DC
  Filled 2019-10-31: qty 15

## 2019-10-31 MED ORDER — FUROSEMIDE 10 MG/ML IJ SOLN
40.0000 mg | Freq: Two times a day (BID) | INTRAMUSCULAR | Status: DC
  Administered 2019-10-31 (×2): 40 mg via INTRAVENOUS
  Filled 2019-10-31: qty 4

## 2019-10-31 MED ORDER — FUROSEMIDE 10 MG/ML IJ SOLN
40.0000 mg | Freq: Two times a day (BID) | INTRAMUSCULAR | Status: DC
  Administered 2019-11-01: 40 mg via INTRAVENOUS
  Filled 2019-10-31: qty 4

## 2019-10-31 MED ORDER — LIDOCAINE VISCOUS HCL 2 % MT SOLN
OROMUCOSAL | Status: AC
  Administered 2019-10-31: 2 mL via OROMUCOSAL
  Filled 2019-10-31: qty 15

## 2019-10-31 MED ORDER — THIAMINE HCL 100 MG/ML IJ SOLN
100.0000 mg | Freq: Every day | INTRAMUSCULAR | Status: DC
  Administered 2019-10-31 – 2019-11-06 (×8): 100 mg via INTRAVENOUS
  Filled 2019-10-31 (×7): qty 2

## 2019-10-31 MED ORDER — POTASSIUM CHLORIDE 10 MEQ/50ML IV SOLN
10.0000 meq | INTRAVENOUS | Status: AC
  Administered 2019-10-31 (×2): 10 meq via INTRAVENOUS
  Filled 2019-10-31: qty 50

## 2019-10-31 MED ORDER — SODIUM CHLORIDE 0.9 % IV SOLN
1.0000 g | Freq: Three times a day (TID) | INTRAVENOUS | Status: AC
  Administered 2019-10-31 – 2019-11-04 (×14): 1 g via INTRAVENOUS
  Filled 2019-10-31 (×15): qty 1

## 2019-10-31 MED ORDER — NEOSTIGMINE METHYLSULFATE 10 MG/10ML IV SOLN
0.2500 mg | Freq: Four times a day (QID) | INTRAVENOUS | Status: DC
  Administered 2019-10-31 – 2019-11-05 (×18): 0.25 mg via SUBCUTANEOUS
  Filled 2019-10-31 (×30): qty 0.25

## 2019-10-31 MED ORDER — MUPIROCIN 2 % EX OINT
TOPICAL_OINTMENT | Freq: Two times a day (BID) | CUTANEOUS | Status: AC
  Administered 2019-10-31: 1 via NASAL
  Filled 2019-10-31: qty 22

## 2019-10-31 MED ORDER — LIDOCAINE VISCOUS HCL 2 % MT SOLN: 15.0000 mL | Freq: Once | OROMUCOSAL | Status: AC

## 2019-10-31 MED ORDER — SODIUM CHLORIDE 0.9 % IV SOLN
0.7500 ug/kg/min | INTRAVENOUS | Status: AC
  Administered 2019-10-31: 0.75 ug/kg/min via INTRAVENOUS
  Filled 2019-10-31 (×2): qty 50

## 2019-10-31 MED ORDER — ACETAMINOPHEN 10 MG/ML IV SOLN
1000.0000 mg | Freq: Four times a day (QID) | INTRAVENOUS | Status: AC
  Administered 2019-10-31 – 2019-11-01 (×4): 1000 mg via INTRAVENOUS
  Filled 2019-10-31 (×4): qty 100

## 2019-10-31 NOTE — Progress Notes (Signed)
1 Day Post-Op Procedure(s) (LRB): DECANNULATION OF ECMO (EXTRACORPOREAL MEMBRANE OXYGENATION) (N/A) TRANSESOPHAGEAL ECHOCARDIOGRAM (TEE) (N/A) Placement Of Impella Left Ventricular Assist Device 5.5 USING 10MM HEMASHIELD PLATINUM GRAFT Sternal Closure (N/A) Subjective:  Patient hemodynamically stable 1 day after ECMO decannulation, chest closure, and placement of Impella 5.5 direct via  the ascending aorta.  Sedated with low-grade temperature 100-1 100.8 Continues broad-spectrum antibiotics.  White count minimally elevated.  Operative cultures remain negative.  Chest x-ray shows atelectasis/infiltrate right base.  Chest tubes have had minimal output.  Heparin administered through Impella 5.5 purge.  With recent RCA stent patient started on Cangrelor since GI track is probably not functioning for Plavix. Core track placed to the pylorus and trickle tube feeds have been started.  Plan lightening sedation and starting vent wean once patient's fever resolves.  We will dose IV acetaminophen because of poor GI function.   Objective: Vital signs in last 24 hours: Temp:  [99.1 F (37.3 C)-101.8 F (38.8 C)] 100.9 F (38.3 C) (06/08 1700) Pulse Rate:  [43-95] 86 (06/08 1700) Cardiac Rhythm: Normal sinus rhythm (06/08 0950) Resp:  [0-23] 16 (06/08 1700) BP: (76-125)/(54-82) 113/82 (06/08 0800) SpO2:  [90 %-100 %] 96 % (06/08 1700) Arterial Line BP: (79-145)/(50-82) 96/58 (06/08 1700) FiO2 (%):  [40 %-50 %] 40 % (06/08 1700) Weight:  [92.3 kg] 92.3 kg (06/08 0558)  Hemodynamic parameters for last 24 hours: PAP: (19-41)/(7-26) 25/13 CVP:  [5 mmHg-14 mmHg] 8 mmHg CO:  [4.8 L/min-7 L/min] 4.8 L/min CI:  [2.4 L/min/m2-3.6 L/min/m2] 2.4 L/min/m2  Intake/Output from previous day: 06/07 0701 - 06/08 0700 In: 7625.7 [I.V.:4428.1; Blood:1351; NG/GT:40; IV Piggyback:1559.3] Out: 3350 [Urine:2190; Emesis/NG output:30; Blood:400; Chest Tube:730] Intake/Output this shift: Total I/O In: 1756.5  [I.V.:934.8; Other:99.2; NG/GT:231.8; IV Piggyback:490.6] Out: 2230 [Urine:1770; Emesis/NG output:300; Chest Tube:160]       Exam    General-sedated and comfortable, intubated    Neck- no JVD, no cervical adenopathy palpable, no carotid bruit   Lungs- clear without rales, wheezes   Cor- regular rate and rhythm, no murmur , gallop   Abdomen- soft, non-tender   Extremities - warm, non-tender, minimal edema   Neuro-pupils equal   Lab Results: Recent Labs    10/31/19 0357 10/31/19 0357 10/31/19 0644 10/31/19 1606  WBC 10.9*  --   --  12.3*  HGB 9.2*   < > 7.8* 8.5*  HCT 27.4*   < > 23.0* 26.2*  PLT 148*  --   --  165   < > = values in this interval not displayed.   BMET:  Recent Labs    10/31/19 0357 10/31/19 0357 10/31/19 0644 10/31/19 1606  NA 136   < > 137 136  K 4.2   < > 4.3 4.2  CL 104  --   --  103  CO2 22  --   --  24  GLUCOSE 123*  --   --  130*  BUN 8  --   --  8  CREATININE 1.15  --   --  1.09  CALCIUM 7.8*  --   --  7.8*   < > = values in this interval not displayed.    PT/INR:  Recent Labs    10/31/19 0357  LABPROT 13.8  INR 1.1   ABG    Component Value Date/Time   PHART 7.396 10/31/2019 0644   HCO3 23.1 10/31/2019 0644   TCO2 24 10/31/2019 0644   ACIDBASEDEF 1.0 10/31/2019 0644   O2SAT 98.0 10/31/2019 0644  CBG (last 3)  Recent Labs    10/31/19 0811 10/31/19 1221 10/31/19 1623  GLUCAP 134* 111* 130*    Assessment/Plan: S/P Procedure(s) (LRB): DECANNULATION OF ECMO (EXTRACORPOREAL MEMBRANE OXYGENATION) (N/A) TRANSESOPHAGEAL ECHOCARDIOGRAM (TEE) (N/A) Placement Of Impella Left Ventricular Assist Device 5.5 USING 10MM HEMASHIELD PLATINUM GRAFT Sternal Closure (N/A) Continue IV antibiotics sedation and anticoagulation consisting of Impella purge and Cangrelor for recent RCA drug-eluting stent Follow-up cultures and start IV acetaminophen for fever.  LOS: 6 days    William Crawford 10/31/2019

## 2019-10-31 NOTE — Significant Event (Signed)
Wasted approximately 160cc of fentanyl into stericycle with another RN Heather Jobe.    William Crawford

## 2019-10-31 NOTE — Progress Notes (Signed)
*  PRELIMINARY RESULTS* Echocardiogram 2D Echocardiogram LIMITED has been performed.  William Crawford 10/31/2019, 1:00 PM

## 2019-10-31 NOTE — Progress Notes (Signed)
[place] for IV Heparin Indication: ECMO > impella  No Known Allergies  Patient Measurements: Height: 5' 8.5" (174 cm) Weight: 92.3 kg (203 lb 7.8 oz) IBW/kg (Calculated) : 69.55 Heparin Dosing Weight: 83.9 kg  Vital Signs: Temp: 100.8 F (38.2 C) (06/08 1205) Temp Source: Core (Comment) (06/08 1100) BP: 113/82 (06/08 0800) Pulse Rate: 88 (06/08 1245)  Labs: Recent Labs    10/29/19 0429 10/29/19 0808 10/29/19 1714 10/29/19 1948 10/30/19 0427 10/30/19 0806 10/30/19 0814 10/30/19 0956 10/30/19 1355 10/30/19 1700 10/30/19 1706 10/30/19 1809 10/30/19 1809 10/31/19 0357 10/31/19 0644  HGB 9.9*  8.8*   < > 9.8*   < > 9.1*   < > 6.8*   < > 8.3*  --    < > 8.6*   < > 9.2* 7.8*  HCT 29.8*  26.0*   < > 30.1*   < > 28.2*   < > 20.0*   < > 25.3*  --    < > 25.3*  --  27.4* 23.0*  PLT 135*   < > 127*   < > 124*   < >  --   --  118*  --   --  129*  --  148*  --   APTT 100*  --  88*  --  82*  --   --   --   --   --   --   --   --   --   --   LABPROT 14.0  --   --   --  14.3  --   --   --   --   --   --   --   --  13.8  --   INR 1.1  --   --   --  1.2  --   --   --   --   --   --   --   --  1.1  --   HEPARINUNFRC 0.27*   < > 0.34   < > 0.25*  --   --   --   --  <0.10*  --   --   --  <0.10*  --   CREATININE 0.79   < > 0.93   < > 0.95   < > 0.80  --  0.84  --   --   --   --  1.15  --    < > = values in this interval not displayed.    Estimated Creatinine Clearance: 88.4 mL/min (by C-G formula based on SCr of 1.15 mg/dL).   Medical History: Past Medical History:  Diagnosis Date  . Alcohol abuse   . GERD (gastroesophageal reflux disease)   . Tobacco abuse     Assessment: 49 year old male initiated on [place] ECMO> decannulated to impella 6/7.  Heparin running through purge solution 26m/hr = 600 uts/hr HL undetectable < 0.1.  Discussion with providers about starting systemic heparin hgb slightly low 7.8, pltc stable 140s, LDH trend down  500>300 fibrinogen trend down 800>600 Absent bowel sounds and concern for no absorbing clopidogrel - new stent 5/25, Will hold systemic heparin for now and cover stent with cangrelor 0.761m/kg/min   Goal of Therapy:  Heparin Level goal 0.3-0.5 Wait until am rounds to add systemic heparin Monitor platelets by anticoagulation protocol: Yes   Plan:  -Continue heparin in purge Start systemic heparin when back on clopidogrel Begin cangrelor 0.7557mkg/min  -Heparin level and CBC  every 12 hours (5am/5pm)  Bonnita Nasuti Pharm.D. CPP, BCPS Clinical Pharmacist [phone] 10/31/2019 1:23 PM    [facility] pharmacy phone numbers are listed on [email or web address]

## 2019-10-31 NOTE — Significant Event (Addendum)
#  10 French small bore feeding tube placed under Fluoro by radiology [name] who reported unable to place post pyloric; difficulty passing the duodenal bulb.   Dr. Vaughan Browner at bedside to look over fluoro images.   Dr. Aundra Dubin made aware unable to pass to post pyloric.  Patient's mother is at bedside and staff gave her updates and plan of care.    William Crawford

## 2019-10-31 NOTE — Progress Notes (Signed)
Pharmacy Antibiotic Note  ABBIE Crawford is a 49 y.o. male admitted on 10/25/2019 with VA ECMO.  Pharmacy has been consulted for Vancomycin and Cefepime dosing.  Spike in temperature 101.8, elevated wbc wnl>11 Check Cx and broaden abx to meropenem   Plan: Stop  Cefepime Meropenem 1gm IV q8h Contiue vancomycin 1086m IV q8h -Will follow renal function and clinical progress    Height: 5' 8.5" (174 cm) Weight: 92.3 kg (203 lb 7.8 oz) IBW/kg (Calculated) : 69.55  Temp (24hrs), Avg:99.9 F (37.7 C), Min:97.5 F (36.4 C), Max:101.8 F (38.8 C)  Recent Labs  Lab 10/27/19 0351 10/27/19 1556 10/28/19 0408 10/28/19 1330 10/28/19 1650 10/29/19 0429 10/29/19 0429 10/29/19 1714 10/29/19 1714 10/29/19 2259 10/30/19 0427 10/30/19 0814 10/30/19 1355 10/30/19 1809 10/31/19 0357 10/31/19 0359  WBC 6.2   < > 5.8  --    < > 7.2   < > 7.6  --   --  7.9  --  8.9 8.7 10.9*  --   CREATININE 0.81   < > 0.83  --    < > 0.79   < > 0.93   < > 0.93 0.95 0.80 0.84  --  1.15  --   LATICACIDVEN 0.7  --  0.8  --   --  1.0  --   --   --   --  0.9  --   --   --   --  0.9  VANCOTROUGH  --   --   --  16  --   --   --   --   --   --   --   --   --   --   --   --    < > = values in this interval not displayed.    Estimated Creatinine Clearance: 88.4 mL/min (by C-G formula based on SCr of 1.15 mg/dL).    No Known Allergies  Antimicrobials this admission: Cefuroxime 6/2 x2 doses Cefepime 6/2 >>6/8 Vancomycin 6/2 >> Meropenem 6/8> Dose adjustments this admission: 6/15- vancomycin trough= 16, no adjustment needed   Microbiology results:   LBonnita NasutiPharm.D. CPP, BCPS Clinical Pharmacist [phone]/12/2019 1:33 PM    **Pharmacist phone directory can now be found on [email or web address] ([email or web address]).  Listed under [place]

## 2019-10-31 NOTE — Progress Notes (Signed)
Cortrak Tube Team Note:  Received request for bridle placement. Diagnostic radiology placed 10 french small bore feeding tube in left nare and RD clipped tube in place with bridle at 89 cm.   Kerman Passey MS, RDN, LDN, CNSC Registered Dietitian III RD Pager Number and RD On-Call Pager Number Located in [place]

## 2019-10-31 NOTE — Progress Notes (Addendum)
Patient ID: THELMER LEGLER, male   DOB: 1970/07/25, 49 y.o.   MRN: [id number]     Advanced Heart Failure Rounding Note  PCP-Cardiologist: No primary care provider on file.   Subjective:    - LAD repair + VA ECMO cannulation in OR 6/3. Chest wall open. - 6/7: ECMO decannulation, Impella 5.5 placement   This morning, he is on epinephrine 3, milrinone 0.375, NE 20.  I/Os positive with surgery yesterday.   Tm 101.8, CXR with right effusion  No bowel sounds, ?if he absorbs any of the meds via OGT.   LDH 662 => 594 => 548 => 514 => 470 => 372 hgb 8.2 => 8.3 => 8.7 => 8.9 => 9.1 => 9.2 Creatinine 0.99 => 0.81 => 0.83 => 0.79 => 0.95 => 1.15 Plts higher 148  - Echo (6/3): EF < 20%, severe RV dysfunction - Echo (6/6): EF 20-25%, normal RV function  Swan: CVP 13 PA 31/17 CI 2.8 Co-ox 61%  Impella P6 => P7 Flow 3.2 L/min => 3.7 L/min No alarms  Objective:   Weight Range: 92.3 kg Body mass index is 30.49 kg/m.   Vital Signs:   Temp:  [97.2 F (36.2 C)-101.8 F (38.8 C)] 100.6 F (38.1 C) (06/08 0700) Pulse Rate:  [43-95] 94 (06/08 0700) Resp:  [0-19] 8 (06/08 0700) BP: (76-125)/(54-77) 125/77 (06/08 0700) SpO2:  [90 %-100 %] 98 % (06/08 0700) Arterial Line BP: (80-145)/(52-77) 122/71 (06/08 0700) FiO2 (%):  [40 %-60 %] 40 % (06/08 0700) Weight:  [92.3 kg] 92.3 kg (06/08 0558) Last BM Date: (PTA)  Weight change: Filed Weights   10/29/19 0500 10/30/19 0500 10/31/19 0558  Weight: 87.8 kg 86.8 kg 92.3 kg    Intake/Output:   Intake/Output Summary (Last 24 hours) at 10/31/2019 0811 Last data filed at 10/31/2019 0800 Gross per 24 hour  Intake 7773.94 ml  Output 3320 ml  Net 4453.94 ml      Physical Exam    General: Intubated/sedated.  Neck: JVP 10 cm, no thyromegaly or thyroid nodule.  Lungs: Clear anteriorly CV: Nondisplaced PMI.  Heart regular S1/S2, no S3/S4, no murmur.  Trace ankle edema.   Abdomen: Soft, no hepatosplenomegaly, no distention.  Skin:  Intact without lesions or rashes.  Neurologic: Sedated on vent.  Extremities: No clubbing or cyanosis.  HEENT: Normal.    Telemetry   NSR 90s (personally reviewed)  Labs    CBC Recent Labs    10/30/19 1809 10/30/19 1809 10/31/19 0357 10/31/19 0644  WBC 8.7  --  10.9*  --   HGB 8.6*   < > 9.2* 7.8*  HCT 25.3*   < > 27.4* 23.0*  MCV 86.9  --  87.5  --   PLT 129*  --  148*  --    < > = values in this interval not displayed.   Basic Metabolic Panel Recent Labs    10/29/19 0429 10/29/19 0808 10/29/19 2259 10/30/19 0425 10/30/19 0427 10/30/19 0806 10/30/19 1355 10/30/19 1706 10/31/19 0357 10/31/19 0644  NA 140  140   < > 140   < > 140   < > 136   < > 136 137  K 3.7  3.7   < > 3.6   < > 4.7   < > 4.6   < > 4.2 4.3  CL 104   < > 103  --  105   < > 104  --  104  --   CO2 24   < >  23  --  22   < > 22  --  22  --   GLUCOSE 91   < > 110*  --  96   < > 148*  --  123*  --   BUN 8   < > 9  --  10   < > 10  --  8  --   CREATININE 0.79   < > 0.93  --  0.95   < > 0.84  --  1.15  --   CALCIUM 8.6*   < > 8.4*  --  8.6*   < > 7.9*  --  7.8*  --   MG 2.1   < > 2.0  --  2.3  --   --   --   --   --   PHOS 4.5  --   --   --  4.1  --   --   --   --   --    < > = values in this interval not displayed.   Liver Function Tests Recent Labs    10/30/19 0427 10/31/19 0357  AST 33 42*  ALT 40 37  ALKPHOS 61 55  BILITOT 1.2 1.2  PROT 5.4* 5.4*  ALBUMIN 2.3* 2.4*   No results for input(s): LIPASE, AMYLASE in the last 72 hours. Cardiac Enzymes No results for input(s): CKTOTAL, CKMB, CKMBINDEX, TROPONINI in the last 72 hours.  BNP: BNP (last 3 results) Recent Labs    10/17/19 0605  BNP 608.8*    ProBNP (last 3 results) No results for input(s): PROBNP in the last 8760 hours.   D-Dimer No results for input(s): DDIMER in the last 72 hours. Hemoglobin A1C No results for input(s): HGBA1C in the last 72 hours. Fasting Lipid Panel No results for input(s): CHOL, HDL, LDLCALC,  TRIG, CHOLHDL, LDLDIRECT in the last 72 hours. Thyroid Function Tests No results for input(s): TSH, T4TOTAL, T3FREE, THYROIDAB in the last 72 hours.  Invalid input(s): FREET3  Other results:   Imaging    DG Chest Port 1 View  Result Date: 10/30/2019 CLINICAL DATA:  49 year old male with left ventricular assist device. Status post pericardial tamponade, emergency cardiopulmonary bypass 4 days ago. EXAM: PORTABLE CHEST 1 VIEW COMPARISON:  1157 hours today and earlier. FINDINGS: Portable AP semi upright view at 1345 hours. Transesophageal scope has been removed. An enteric feeding tube has been placed, side hole at the level of the gastric cardia. Cardiac and Pella device remains in place and appears stable from earlier today. Otherwise stable lines and tubes. Right pleural effusion is more apparent. No pneumothorax identified. No pulmonary edema. Allowing for portable technique the left lung is clear. Mediastinal contours remain within normal limits. Midline sternotomy wires and skin staples. IMPRESSION: 1. Stable LV assist device from earlier today. 2. Transesophageal scope removed and enteric feeding tube placed, side hole at the level of the gastric cardia. 3. Otherwise stable lines and tubes. 4. Increased visualization of small right pleural effusion. No pneumothorax or pulmonary edema. Electronically Signed   By: Genevie Ann M.D.   On: 10/30/2019 15:42   DG Chest Portable 1 View  Result Date: 10/30/2019 CLINICAL DATA:  RIGHT chest tube placement, post cardiac surgery EXAM: PORTABLE CHEST 1 VIEW COMPARISON:  Portable exam 1157 hours compared to 1142 hours FINDINGS: New RIGHT thoracostomy tube. Remaining tubes and lines unchanged. Endotracheal tube tip poorly visualized. Decreased RIGHT pleural effusion. Bibasilar atelectasis. IMPRESSION: No pneumothorax following RIGHT chest tube insertion. Bibasilar atelectasis  with decreased RIGHT pleural effusion. Electronically Signed   By: Lavonia Dana M.D.   On:  10/30/2019 12:35   DG Chest Port 1 View  Result Date: 10/30/2019 CLINICAL DATA:  Post cardiac surgery EXAM: PORTABLE CHEST 1 VIEW COMPARISON:  Portable exam 1142 hours compared to 0531 hours FINDINGS: Endotracheal tube obscured by other support devices. Mediastinal drain and LEFT thoracostomy tube. Impella device projects over LEFT ventricle. Scope projects over mediastinum. RIGHT arm PICC line tip projects over SVC. RIGHT jugular Swan-Ganz catheter tip projects over RIGHT pulmonary artery at hilum. Post median sternotomy. Epicardial pacing wires noted. RIGHT pleural effusion and basilar atelectasis. Linear subsegmental atelectasis LEFT lower lobe. No pneumothorax. IMPRESSION: Lines and tubes as above. Bibasilar atelectasis and small RIGHT pleural effusion. Electronically Signed   By: Lavonia Dana M.D.   On: 10/30/2019 12:33     Medications:     Scheduled Medications: . aspirin  81 mg Per Tube Daily  . atorvastatin  40 mg Per Tube Daily  . bisacodyl  10 mg Oral Daily   Or  . bisacodyl  10 mg Rectal Daily  . chlorhexidine gluconate (MEDLINE KIT)  15 mL Mouth Rinse BID  . Chlorhexidine Gluconate Cloth  6 each Topical Daily  . clopidogrel  75 mg Oral Daily  . docusate  200 mg Per Tube Daily  . folic acid  1 mg Intravenous Daily  . furosemide  40 mg Intravenous BID  . insulin aspart  0-9 Units Subcutaneous Q4H  . ketotifen  1 drop Both Eyes BID  . mouth rinse  15 mL Mouth Rinse 10 times per day  . metoCLOPramide (REGLAN) injection  10 mg Intravenous Q8H  . pantoprazole (PROTONIX) IV  40 mg Intravenous QHS  . sodium chloride flush  10-40 mL Intracatheter Q12H  . sodium chloride flush  3 mL Intravenous Q12H    Infusions: . sodium chloride    . sodium chloride Stopped (10/28/19 0227)  . sodium chloride    . sodium chloride 10 mL/hr at 10/31/19 0800  . sodium chloride 10 mL/hr at 10/31/19 0800  . amiodarone 30 mg/hr (10/31/19 0800)  . dexmedetomidine (PRECEDEX) IV infusion 0.7 mcg/kg/hr  (10/31/19 0800)  . epinephrine 3 mcg/min (10/31/19 0800)  . fentaNYL infusion INTRAVENOUS Stopped (10/30/19 1244)  . impella catheter heparin 50 unit/mL in dextrose 5%    . HYDROmorphone 3 mg/hr (10/31/19 0800)  . lactated ringers Stopped (10/28/19 0624)  . lactated ringers    . midazolam 3 mg/hr (10/31/19 0800)  . milrinone 0.375 mcg/kg/min (10/31/19 0800)  . norepinephrine (LEVOPHED) Adult infusion 20 mcg/min (10/31/19 0800)  . potassium chloride    . thiamine injection Stopped (10/29/19 0942)  . vancomycin Stopped (10/31/19 0712)    PRN Medications: sodium chloride, sodium chloride, HYDROmorphone, midazolam, ondansetron (ZOFRAN) IV, ondansetron (ZOFRAN) IV, sodium chloride flush, sodium chloride flush   Assessment/Plan   1. Cardiogenic shock: Pre-OR echo with EF 45%, apical hypokinesis.  TEE in OR after VF arrest showed severe biventricular dysfunction. Placed on ECMO. Repeat echo 6/6 with EF 20-25%, RV function improved/near normal. Decannulated to Impella 5.5 on 6/7.  This morning, CI 2.8 with co-ox 61%.  He remains on epinephrine 3, milrinone 0.375, NE 20. MAP stable, CVP 13 with ++I/Os yesterday due to OR. LDH trending down. Creatinine stable.    - Increased Impella this morning to P7 with improved flow.  Will check position under echo today.  Currently only heparin through purge, will discuss systemic with Dr. Prescott Gum.  -  Wean NE and epinephrine as able.  - I/Os ++ with higher CVP, Lasix 40 mg IV bid for now.  2. Pericardial tamponade: Due to LAD perforation.  Now resolved in OR.  3. CAD: S/p DES to distal RCA on 5/25.  Has known CTO OM.  Had attempted intervention on CTO LAD today that was complicated by LAD perforation, now s/p LAD repair.  The LAD was not graftable, LAD territory therefore is not perfused except by pre-existing collaterals. Chest remains open. - Continue ASA 81/Plavix 75 for now, but concerned he is not absorbing meds via NGT (no bowel sounds).  Will discuss  Cangrelor with Dr. Prescott Gum given recent RCA stent.  - Continue statin, Crestor 20.   4. ETOH abuse: Watch for withdrawal.   5. Smoking: Will need to quit.  6. Acute hypoxemic respiratory failure: Per CCM.   7. Neuro: Will wake up and move all extremities.  8. GI: Suspect ileus.  No bowel sounds.  Concerned he is not absorbing meds.  - KUB - Will order Cortrak. - Not currently getting tube feeds.  - IV Reglan and will add neostigmine.  9. VF arrest: In OR.  Occasional PVCs with increase in Impella speed.  - Off lidocaine.  10. ID: Febrile overnight despite broad spectrum abx.  CXR with right effusion.  - Send sputum, blood, urine cultures.  - Broaden coverage to vancomycin/meropenem.   CRITICAL CARE Performed by: Loralie Champagne  Total critical care time: 40 minutes  Critical care time was exclusive of separately billable procedures and treating other patients.  Critical care was necessary to treat or prevent imminent or life-threatening deterioration.  Critical care was time spent personally by me on the following activities: development of treatment plan with patient and/or surrogate as well as nursing, discussions with consultants, evaluation of patient's response to treatment, examination of patient, obtaining history from patient or surrogate, ordering and performing treatments and interventions, ordering and review of laboratory studies, ordering and review of radiographic studies, pulse oximetry and re-evaluation of patient's condition.   Length of Stay: 6  Loralie Champagne, MD  10/31/2019, 8:11 AM  Advanced Heart Failure Team Pager [phone] (M-F; 7a - 4p)  Please contact [facility] for night-coverage after hours (4p -7a ) and weekends on [email or web address]  Impella position assessed under echo today.  EF in range of 25% with normal RV size and systolic function.  Impella position adjusted, left at around 5.4 cm.  The LV cavity is relatively small, will keep at P6 to avoid NSVT.    Will start Cangrelor IV as I am concerned that he is not absorbing Plavix in OGT.   Loralie Champagne 10/31/2019 1:19 PM

## 2019-10-31 NOTE — Progress Notes (Signed)
NAME:  William Crawford, MRN:  [id number], DOB:  February 05, 1971, LOS: 6 ADMISSION DATE:  10/25/2019, CONSULTATION DATE:  10/25/19 REFERRING MD:  [place], CHIEF COMPLAINT:  Heart failure  Brief History   49 yo male admitted to ICU on 10/25/19 following CTO PCI to LAD at which time he unfortunately developed a perforated LAD and went into vfib arrest. He subsequently underwent emergent repair however was unable to be removed from bypass following the procedure. PCCM consulted for ECMO.  History of present illness   Pt initially presented with unstable angina on 5/25 at which time he underwent a heart cath which revealed occlusive disease involving the RCA as well as some chronic occlusions involving the first OM branch and proximal LAD. DES placed to the RCA lesion. He was subsequently discharged on DAPT and returned today for stent placement to the LAD.  He, unfortunately, developed a large perforation to the LAD during the procedure subsequently undergoing vfib arrest (time to ROSC 69mn) and was taken for emergent repair.  Past Medical History  Tobacco use, alcohol use, HFrEF, CAD s/p DES placement 5/25  Significant Hospital Events   6/2 LAD perforation during DES placement>Vfib arrest (14 min)>emergent repair>unable to wean bypass>PCCM consulted for ECMO 6/7 decannulation of VA ECMO and placement of impella 5.5 LVAD with sternal closure  Consults:  PCCM Cardiology CT surgery  Procedures:  6/2 PCI 6/2 LAD repair 6/2 ECMO  ETT 6/2>> Central line 6/2>>  Significant Diagnostic Tests:  5/25 echocardiogram>>LVEF 35-40% with regional wall motion abnormalities. Severe inferolateral hypokinesis; G1DD. No valvular disease  Micro Data:  n/a  Antimicrobials:  Cefepime 6/2 > 6/8 Vanc 6/2 > Merrem 6/8 >   Interim history/subjective:  Decannulated from ECMO overnight. Febrile overnight, abx broadened to vanc / merrem.  Objective   Blood pressure 113/82, pulse 88, temperature (!) 100.9 F  (38.3 C), resp. rate 10, height 5' 8.5" (1.74 m), weight 92.3 kg, SpO2 99 %. PAP: (31-49)/(16-34) 33/20 CVP:  [8 mmHg-14 mmHg] 13 mmHg CO:  [5.6 L/min-7 L/min] 5.6 L/min CI:  [2.8 L/min/m2-3.6 L/min/m2] 2.8 L/min/m2  Vent Mode: PRVC FiO2 (%):  [40 %-60 %] 40 % Set Rate:  [16 bmp] 16 bmp Vt Set:  [550 mL] 550 mL PEEP:  [5 cmH20] 5 cmH20 Plateau Pressure:  [24 cmH20-26 cmH20] 24 cmH20   Intake/Output Summary (Last 24 hours) at 10/31/2019 1044 Last data filed at 10/31/2019 0900 Gross per 24 hour  Intake 5184.12 ml  Output 2855 ml  Net 2329.12 ml   Filed Weights   10/29/19 0500 10/30/19 0500 10/31/19 0558  Weight: 87.8 kg 86.8 kg 92.3 kg    Examination: General: Adult male, critically ill and intubated HENT: NCAT, ET tube in place Lungs: Bilateral mechanically ventilated breath sounds Cardiovascular: Sternal dressing C/D/I.  RRR, no M/R/G Abdomen: Soft nontender nondistended Extremities: No cyanosis no significant edema Neuro: Awakes to voice, tracks appropriately moves all 4 extremities GU: Foley in place   Resolved Hospital Problem list   Pericardial tamponade--2/2 LAD perforation  Assessment & Plan:   Cardiogenic shock. EF 45% prior to OR. Post Vfib arrest echo suggestive of biventricular failure. Decompensated heart failure s/p thoracotomy for repair of perforation of LAD during PCI. On V-A ECMO since 6/2 and off 6/7 s/p decannulation and now with 5.5 Impella LVAD. - Continue management per cards / TCTS. - Repeat echocardiogram per cardiology.  ICM, acute HFrEF,  CAD, h/o HTN.  NSTEMI s/p DES to RCA 5/25.  Cardiogenic shock; likely distributive shock  from sedation contributing. -Continue inotropes and vasopressors as needed to maintain mean arterial pressure than 65. -Continue telemetry monitoring.  Acute hypoxic respiratory failure requiring mechanical ventilation.  Tobacco use--1ppd smoker. - Continue Low tidal volume ventilation. -Adult ventilator  protocol. -Pulmonary hygiene. -Wean FiO2 and PEEP as tolerated - Follow CXR.  High risk alcohol use. Risk for ETOH w/d. -Sedation with fentanyl Versed. - Thiamine / Folate.  High risk malnutrition. -Trickle tube feeds.  Elevated transaminases likely due to cardiac failure-improving. -Continue to monitor.  Absent Bowel Sounds - ? Ileus. - Post pyloric feeding tube being placed now. - Assess KUB. - Continue reglan, neostigmine.  Best practice:  Diet: NPO. Tube feeds Pain/Anxiety/Delirium protocol (if indicated): dilaudid, precedex, versed VAP protocol (if indicated): yes DVT prophylaxis: heparin gtt GI prophylaxis: pepcid Glucose control: per primary Mobility: BR Code Status: Full Family Communication: Per primary Disposition: ICU  CC time: 40 min.   Montey Hora, [facility] 10/31/2019, 11:03 AM

## 2019-10-31 NOTE — Progress Notes (Signed)
Nutrition Follow-up  DOCUMENTATION CODES:   Not applicable  INTERVENTION:   Tube Feeding via Cortrak (gastric): Begin trickle TF; Vital 1.5 at 30 ml/hr ordered by MD   NUTRITION DIAGNOSIS:   Inadequate oral intake related to acute illness as evidenced by NPO status.  Continues  GOAL:   Patient will meet greater than or equal to 90% of their needs  Not Met  MONITOR:   Vent status, TF tolerance, Labs, Weight trends  REASON FOR ASSESSMENT:   Consult, Ventilator Enteral/tube feeding initiation and management  ASSESSMENT:   49 yo male admitted to ICU on 6/2 following CTO PCI to LAD and developed perforated LAD followed by Vfib arrest requiring emergent repair and VA ECMO cannulation, transferred to ICU on vent support. PMH includes CAD, EtOH abuse, HTN, ICM, CHF  6/02 LAD perforation during DES placement followed by Vfib arrest, requiring emergent repair and VA ECMO cannulation 6/07 ECMO decannulation, Impella 5.5 placement 6/08 10 fr tube placed by Radiology, gastric  Radiology attempted to place small bore feeding tube post-pyloric but unsuccessful. Per [name] with DR, tube tip is right at the pylorus or in duodenal bulb.   Noted suspected ileus per MD notes, reglan and neostigmine ordered today. Per abd xray, bowel pattern normal. Suspect that given critical illness but with some degree of delayed gastric emptying.   Pt is constipated with no BM since admission; x 6 days. Recommend aggressive bowel regimen.  Abdomen soft, no BS  Per chart review, pt last received TF on 6/06 briefly. Pt also received TF on 6/04.Noted TF held early on 6/5 due to abd distention with residuals of 600 mL. Pt with minimal nutrition since admission  Current weight 92.3 kg; weight yesterday 86.8 kg prior to return to OR  Labs: reviewed Meds: thiamine, reglan, neostigmine, ss novolog, lasix   Diet Order:   Diet Order    None      EDUCATION NEEDS:   Not appropriate for education at  this time  Skin:  Skin Assessment: Skin Integrity Issues: Skin Integrity Issues:: Wound VAC Wound Vac: chest incision  Last BM:  PTA  Height:   Ht Readings from Last 1 Encounters:  10/25/19 5' 8.5" (1.74 m)    Weight:   Wt Readings from Last 1 Encounters:  10/31/19 92.3 kg    BMI:  Body mass index is 30.49 kg/m.  Estimated Nutritional Needs:   Kcal:  2100-2500 kcals  Protein:  126-151 g  Fluid:  >/= 2 L    Kerman Passey MS, RDN, LDN, CNSC Registered Dietitian III RD Pager Number and RD On-Call Pager Number Located in [place]

## 2019-10-31 NOTE — Progress Notes (Addendum)
[place] for IV Heparin Indication: ECMO > impella  No Known Allergies  Patient Measurements: Height: 5' 8.5" (174 cm) Weight: 92.3 kg (203 lb 7.8 oz) IBW/kg (Calculated) : 69.55 Heparin Dosing Weight: 83.9 kg  Vital Signs: Temp: 100.9 F (38.3 C) (06/08 1700) Temp Source: Core (Comment) (06/08 1700) BP: 113/82 (06/08 0800) Pulse Rate: 86 (06/08 1700)  Labs: Recent Labs    10/29/19 0429 10/29/19 0808 10/29/19 1714 10/29/19 1948 10/30/19 0427 10/30/19 0427 10/30/19 0806 10/30/19 1355 10/30/19 1700 10/30/19 1706 10/30/19 1809 10/30/19 1809 10/31/19 0357 10/31/19 0357 10/31/19 0644 10/31/19 1606  HGB 9.9*  8.8*   < > 9.8*   < > 9.1*  --    < > 8.3*  --    < > 8.6*   < > 9.2*   < > 7.8* 8.5*  HCT 29.8*  26.0*   < > 30.1*   < > 28.2*  --    < > 25.3*  --    < > 25.3*   < > 27.4*  --  23.0* 26.2*  PLT 135*   < > 127*   < > 124*  --    < > 118*  --    < > 129*  --  148*  --   --  165  APTT 100*  --  88*  --  82*  --   --   --   --   --   --   --   --   --   --   --   LABPROT 14.0  --   --   --  14.3  --   --   --   --   --   --   --  13.8  --   --   --   INR 1.1  --   --   --  1.2  --   --   --   --   --   --   --  1.1  --   --   --   HEPARINUNFRC 0.27*   < > 0.34   < > 0.25*   < >  --   --  <0.10*  --   --   --  <0.10*  --   --  <0.10*  CREATININE 0.79   < > 0.93   < > 0.95  --    < > 0.84  --   --   --   --  1.15  --   --  1.09   < > = values in this interval not displayed.    Estimated Creatinine Clearance: 93.3 mL/min (by C-G formula based on SCr of 1.09 mg/dL).   Medical History: Past Medical History:  Diagnosis Date  . Alcohol abuse   . GERD (gastroesophageal reflux disease)   . Tobacco abuse     Assessment: 49 year old male initiated on [place] ECMO> decannulated to impella 6/7.  Heparin running through purge solution, 55m/hr = 600 uts/hr. Absent bowel sounds and concern for not absorbing clopidogrel - new stent 5/25.  Per discussion with providers, holding off on systemic heparin for now and covering stent with cangrelor 0.75 mcg/kg/min. Hgb low stable 8.5, pltc up to 165, LDH trend down to 372, fibrinogen trend down to 697.  PM update - Heparin level remains undetectable, purge rate stable ~12 ml/hr. No active bleed issues reported. Per previous pharmacist discussion with providers, will continue  as is for now and f/u further discussion on starting systemic heparin 6/9 AM.  Goal of Therapy:  Heparin Level goal 0.3-0.5 Wait until am rounds to add systemic heparin Monitor platelets by anticoagulation protocol: Yes   Plan:  Continue heparin in purge F/u starting systemic heparin as appropriate when back on clopidogrel Continue cangrelor 0.24mg/kg/min Check heparin level and CBC every 12 hours (5am/5pm) Monitor for s/sx bleeding   HArturo Morton PharmD, BCPS Please check AMION for all [place]contact numbers Clinical Pharmacist 10/31/2019 5:18 PM

## 2019-11-01 ENCOUNTER — Inpatient Hospital Stay (HOSPITAL_COMMUNITY): Payer: BLUE CROSS/BLUE SHIELD

## 2019-11-01 DIAGNOSIS — I5023 Acute on chronic systolic (congestive) heart failure: Secondary | ICD-10-CM

## 2019-11-01 DIAGNOSIS — Z95811 Presence of heart assist device: Secondary | ICD-10-CM

## 2019-11-01 LAB — TYPE AND SCREEN
ABO/RH(D): A POS
Antibody Screen: NEGATIVE
Unit division: 0
Unit division: 0
Unit division: 0
Unit division: 0

## 2019-11-01 LAB — BASIC METABOLIC PANEL
Anion gap: 15 (ref 5–15)
Anion gap: 7 (ref 5–15)
BUN: 9 mg/dL (ref 6–20)
BUN: 10 mg/dL (ref 6–20)
CO2: 24 mmol/L (ref 22–32)
CO2: 26 mmol/L (ref 22–32)
Calcium: 7.9 mg/dL — ABNORMAL LOW (ref 8.9–10.3)
Calcium: 7.5 mg/dL — ABNORMAL LOW (ref 8.9–10.3)
Chloride: 98 mmol/L (ref 98–111)
Chloride: 104 mmol/L (ref 98–111)
Creatinine, Ser: 1 mg/dL (ref 0.61–1.24)
Creatinine, Ser: 0.99 mg/dL (ref 0.61–1.24)
GFR calc Af Amer: >60 mL/min (ref >60)
GFR calc Af Amer: >60 mL/min (ref >60)
GFR calc non Af Amer: >60 mL/min (ref >60)
GFR calc non Af Amer: >60 mL/min (ref >60)
Glucose, Bld: 128 mg/dL — ABNORMAL HIGH (ref 70–99)
Glucose, Bld: 122 mg/dL — ABNORMAL HIGH (ref 70–99)
Potassium: 3.8 mmol/L (ref 3.5–5.1)
Potassium: 3.4 mmol/L — ABNORMAL LOW (ref 3.5–5.1)
Sodium: 137 mmol/L (ref 135–145)
Sodium: 137 mmol/L (ref 135–145)

## 2019-11-01 LAB — POCT I-STAT 7, (LYTES, BLD GAS, ICA,H+H)
Acid-Base Excess: 4 mmol/L — ABNORMAL HIGH (ref 0.0–2.0)
Acid-Base Excess: 7 mmol/L — ABNORMAL HIGH (ref 0.0–2.0)
Bicarbonate: 29.3 mmol/L — ABNORMAL HIGH (ref 20.0–28.0)
Bicarbonate: 31.3 mmol/L — ABNORMAL HIGH (ref 20.0–28.0)
Calcium, Ion: 1.13 mmol/L — ABNORMAL LOW (ref 1.15–1.40)
Calcium, Ion: 1.09 mmol/L — ABNORMAL LOW (ref 1.15–1.40)
HCT: 24 % — ABNORMAL LOW (ref 39.0–52.0)
HCT: 23 % — ABNORMAL LOW (ref 39.0–52.0)
Hemoglobin: 8.2 g/dL — ABNORMAL LOW (ref 13.0–17.0)
Hemoglobin: 7.8 g/dL — ABNORMAL LOW (ref 13.0–17.0)
O2 Saturation: 96 %
O2 Saturation: 97 %
Patient temperature: 37.2
Patient temperature: 37
Potassium: 3.9 mmol/L (ref 3.5–5.1)
Potassium: 3.8 mmol/L (ref 3.5–5.1)
Sodium: 137 mmol/L (ref 135–145)
Sodium: 137 mmol/L (ref 135–145)
TCO2: 31 mmol/L (ref 22–32)
TCO2: 33 mmol/L — ABNORMAL HIGH (ref 22–32)
pCO2 arterial: 47.5 mmHg (ref 32.0–48.0)
pCO2 arterial: 42.7 mmHg (ref 32.0–48.0)
pH, Arterial: 7.399 (ref 7.350–7.450)
pH, Arterial: 7.473 — ABNORMAL HIGH (ref 7.350–7.450)
pO2, Arterial: 81 mmHg — ABNORMAL LOW (ref 83.0–108.0)
pO2, Arterial: 82 mmHg — ABNORMAL LOW (ref 83.0–108.0)

## 2019-11-01 LAB — CBC
HCT: 26.6 % — ABNORMAL LOW (ref 39.0–52.0)
HCT: 23.9 % — ABNORMAL LOW (ref 39.0–52.0)
Hemoglobin: 8.6 g/dL — ABNORMAL LOW (ref 13.0–17.0)
Hemoglobin: 7.8 g/dL — ABNORMAL LOW (ref 13.0–17.0)
MCH: 28.6 pg (ref 26.0–34.0)
MCH: 29 pg (ref 26.0–34.0)
MCHC: 32.3 g/dL (ref 30.0–36.0)
MCHC: 32.6 g/dL (ref 30.0–36.0)
MCV: 88.4 fL (ref 80.0–100.0)
MCV: 88.8 fL (ref 80.0–100.0)
Platelets: 190 10*3/uL (ref 150–400)
Platelets: 177 10*3/uL (ref 150–400)
RBC: 3.01 MIL/uL — ABNORMAL LOW (ref 4.22–5.81)
RBC: 2.69 MIL/uL — ABNORMAL LOW (ref 4.22–5.81)
RDW: 15 % (ref 11.5–15.5)
RDW: 15 % (ref 11.5–15.5)
WBC: 13.8 10*3/uL — ABNORMAL HIGH (ref 4.0–10.5)
WBC: 12 10*3/uL — ABNORMAL HIGH (ref 4.0–10.5)
nRBC: 0.1 % (ref 0.0–0.2)
nRBC: 0.3 % — ABNORMAL HIGH (ref 0.0–0.2)

## 2019-11-01 LAB — BPAM RBC
Blood Product Expiration Date: 202106242359
Blood Product Expiration Date: 202106242359
Blood Product Expiration Date: 202106242359
Blood Product Expiration Date: 202106242359
ISSUE DATE / TIME: 202106070745
ISSUE DATE / TIME: 202106070745
ISSUE DATE / TIME: 202106070745
ISSUE DATE / TIME: 202106070745
Unit Type and Rh: 6200
Unit Type and Rh: 6200
Unit Type and Rh: 6200
Unit Type and Rh: 6200

## 2019-11-01 LAB — HEPATIC FUNCTION PANEL
ALT: 42 U/L (ref 0–44)
AST: 79 U/L — ABNORMAL HIGH (ref 15–41)
Albumin: 2.2 g/dL — ABNORMAL LOW (ref 3.5–5.0)
Alkaline Phosphatase: 73 U/L (ref 38–126)
Bilirubin, Direct: 0.2 mg/dL (ref 0.0–0.2)
Indirect Bilirubin: 0.6 mg/dL (ref 0.3–0.9)
Total Bilirubin: 0.8 mg/dL (ref 0.3–1.2)
Total Protein: 5.6 g/dL — ABNORMAL LOW (ref 6.5–8.1)

## 2019-11-01 LAB — GLUCOSE, CAPILLARY
Glucose-Capillary: 98 mg/dL (ref 70–99)
Glucose-Capillary: 102 mg/dL — ABNORMAL HIGH (ref 70–99)
Glucose-Capillary: 156 mg/dL — ABNORMAL HIGH (ref 70–99)
Glucose-Capillary: 116 mg/dL — ABNORMAL HIGH (ref 70–99)

## 2019-11-01 LAB — COOXEMETRY PANEL
Carboxyhemoglobin: 1.4 % (ref 0.5–1.5)
Methemoglobin: 1.3 % (ref 0.0–1.5)
O2 Saturation: 60.2 %
Total hemoglobin: 8.5 g/dL — ABNORMAL LOW (ref 12.0–16.0)

## 2019-11-01 LAB — URINE CULTURE: Culture: NO GROWTH

## 2019-11-01 LAB — FUNGUS STAIN

## 2019-11-01 LAB — MAGNESIUM: Magnesium: 2.4 mg/dL (ref 1.7–2.4)

## 2019-11-01 LAB — HEPARIN LEVEL (UNFRACTIONATED)
Heparin Unfractionated: <0.1 IU/mL — ABNORMAL LOW (ref 0.30–0.70)
Heparin Unfractionated: <0.1 IU/mL — ABNORMAL LOW (ref 0.30–0.70)

## 2019-11-01 LAB — VANCOMYCIN, TROUGH: Vancomycin Tr: 19 ug/mL (ref 15–20)

## 2019-11-01 LAB — FUNGAL STAIN REFLEX

## 2019-11-01 LAB — PREPARE RBC (CROSSMATCH)

## 2019-11-01 LAB — CYTOLOGY - NON PAP

## 2019-11-01 LAB — PROTIME-INR
INR: 1.2 (ref 0.8–1.2)
Prothrombin Time: 14.3 seconds (ref 11.4–15.2)

## 2019-11-01 LAB — LACTATE DEHYDROGENASE: LDH: 370 U/L — ABNORMAL HIGH (ref 98–192)

## 2019-11-01 LAB — ECHOCARDIOGRAM LIMITED
Height: 68.5 in
Weight: 3255.75 oz

## 2019-11-01 MED ORDER — SODIUM CHLORIDE 0.9 % IV SOLN
0.7500 ug/kg/min | INTRAVENOUS | Status: DC
  Administered 2019-11-01 – 2019-11-05 (×10): 0.75 ug/kg/min via INTRAVENOUS
  Filled 2019-11-01 (×11): qty 50

## 2019-11-01 MED ORDER — SODIUM CHLORIDE 0.9% IV SOLUTION: Freq: Once | INTRAVENOUS | Status: AC

## 2019-11-01 MED ORDER — POTASSIUM CHLORIDE 10 MEQ/50ML IV SOLN
10.0000 meq | INTRAVENOUS | Status: AC
  Administered 2019-11-01 (×3): 10 meq via INTRAVENOUS
  Filled 2019-11-01 (×3): qty 50

## 2019-11-01 MED ORDER — FUROSEMIDE 10 MG/ML IJ SOLN
40.0000 mg | Freq: Three times a day (TID) | INTRAMUSCULAR | Status: DC
  Administered 2019-11-01 – 2019-11-02 (×2): 40 mg via INTRAVENOUS
  Filled 2019-11-01 (×3): qty 4

## 2019-11-01 NOTE — Progress Notes (Signed)
° °   °  [address]       [place] [address]             [phone]      Sedated  BP 92/65    Pulse 79    Temp 98.6 F (37 C) (Oral)    Resp 16    Ht 5' 8.5" (1.74 m)    Wt 92.3 kg    SpO2 99%    BMI 30.49 kg/m  impella in place- 2.4 L/min Milrinone and norepi   Intake/Output Summary (Last 24 hours) at 11/01/2019 1837 Last data filed at 11/01/2019 1800 Gross per 24 hour  Intake 5083.39 ml  Output 2980 ml  Net 2103.39 ml   Hct= 23- transfusion in progress  Melanie Pellot C. Roxan Hockey, MD [facility] [phone]

## 2019-11-01 NOTE — Progress Notes (Signed)
ANTICOAGULATION CONSULT NOTE   Pharmacy Consult for IV Heparin Indication: ECMO > impella  No Known Allergies  Patient Measurements: Height: 5' 8.5" (174 cm) Weight: 92.3 kg (203 lb 7.8 oz) IBW/kg (Calculated) : 69.55 Heparin Dosing Weight: 83.9 kg  Vital Signs: Temp: 98.6 F (37 C) (06/09 1812) Temp Source: Oral (06/09 1812) BP: 92/65 (06/09 1700) Pulse Rate: 79 (06/09 1812)  Labs: Recent Labs    10/30/19 0427 10/30/19 0806 10/31/19 0357 10/31/19 0644 10/31/19 1606 10/31/19 1606 11/01/19 0500 11/01/19 0500 11/01/19 0511 11/01/19 0511 11/01/19 1345 11/01/19 1600 11/01/19 1748  HGB 9.1*   < > 9.2*   < > 8.5*   < > 8.6*   < > 8.2*   < >  --  7.8* 7.8*  HCT 28.2*   < > 27.4*   < > 26.2*   < > 26.6*   < > 24.0*  --   --  23.9* 23.0*  PLT 124*   < > 148*   < > 165  --  190  --   --   --   --  177  --   APTT 82*  --   --   --   --   --   --   --   --   --   --   --   --   LABPROT 14.3  --  13.8  --   --   --  14.3  --   --   --   --   --   --   INR 1.2  --  1.1  --   --   --  1.2  --   --   --   --   --   --   HEPARINUNFRC 0.25*   < > <0.10*   < > <0.10*  --   --   --   --   --  <0.10* <0.10*  --   CREATININE 0.95   < > 1.15   < > 1.09  --  1.00  --   --   --   --  0.99  --    < > = values in this interval not displayed.    Estimated Creatinine Clearance: 102.7 mL/min (by C-G formula based on SCr of 0.99 mg/dL).   Medical History: Past Medical History:  Diagnosis Date  . Alcohol abuse   . GERD (gastroesophageal reflux disease)   . Tobacco abuse     Assessment: 49 year old male initiated on [place] ECMO> decannulated to impella 6/7.  Heparin running through purge solution, 11.6 ml/hr =580 uts/hr. Still absent bowel sounds and concern for not absorbing clopidogrel - new stent 5/25. Per discussion with providers, continue to hold off on systemic heparin for now and covering stent with cangrelor 0.75 mcg/kg/min. Hgb low stable 8.2, pltc up to 190, LDH trend down to 372  yesterday, fibrinogen trend down to 697 on last check.  Heparin level this evening remains undetectable as expected.  No bleeding or complications noted.  Goal of Therapy:  Wait until able to take plavix prior to adding systemic heparin Monitor platelets by anticoagulation protocol: Yes   Plan:  Continue heparin in purge F/u starting systemic heparin as appropriate when back on clopidogrel Continue cangrelor 0.47mg/kg/min Check heparin level and CBC every 12 hours (5am/5pm) Monitor for s/sx bleeding  JUvaldo Rising BCPS, BSelect Specialty Hospital - North KnoxvilleClinical Pharmacist  11/01/2019 6:50 PM    Please check AMION for all  [facility] phone numbers After 10:00 PM, call [place] [phone]

## 2019-11-01 NOTE — Progress Notes (Signed)
NAME:  William Crawford, MRN:  [id number], DOB:  11/26/1970, LOS: 7 ADMISSION DATE:  10/25/2019, CONSULTATION DATE:  10/25/19 REFERRING MD:  [place], CHIEF COMPLAINT:  Heart failure  Brief History   49 yo male admitted to ICU on 10/25/19 following CTO PCI to LAD at which time he unfortunately developed a perforated LAD and went into vfib arrest. He subsequently underwent emergent repair however was unable to be removed from bypass following the procedure. PCCM consulted for ECMO.  History of present illness   Pt initially presented with unstable angina on 5/25 at which time he underwent a heart cath which revealed occlusive disease involving the RCA as well as some chronic occlusions involving the first OM branch and proximal LAD. DES placed to the RCA lesion. He was subsequently discharged on DAPT and returned today for stent placement to the LAD.  He, unfortunately, developed a large perforation to the LAD during the procedure subsequently undergoing vfib arrest (time to ROSC 38mn) and was taken for emergent repair.  Past Medical History  Tobacco use, alcohol use, HFrEF, CAD s/p DES placement 5/25  Significant Hospital Events   6/2 LAD perforation during DES placement>Vfib arrest (14 min)>emergent repair>unable to wean bypass>PCCM consulted for ECMO 6/7 decannulation of VA ECMO and placement of impella 5.5 LVAD with sternal closure 6/7 Decannulated from ECMO  Consults:  PCCM Cardiology CT surgery  Procedures:  6/2 PCI 6/2 LAD repair 6/2 ECMO  ETT 6/2>> Central line 6/2>>  Significant Diagnostic Tests:  5/25 echocardiogram>>LVEF 35-40% with regional wall motion abnormalities. Severe inferolateral hypokinesis; G1DD. No valvular disease  Micro Data:  n/a  Antimicrobials:  Cefepime 6/2 > 6/8 Vanc 6/2 > Merrem 6/8 >   Interim history/subjective:   Weaning pressors. Remains on deep sedation due to agitation.   Objective   Blood pressure 123/72, pulse 77, temperature 98.1 F  (36.7 C), temperature source Core, resp. rate 16, height 5' 8.5" (1.74 m), weight 92.3 kg, SpO2 98 %. PAP: (19-38)/(7-26) 26/22 CVP:  [5 mmHg-17 mmHg] 13 mmHg CO:  [4.8 L/min-6.5 L/min] 5.6 L/min CI:  [2.4 L/min/m2-3.3 L/min/m2] 2.8 L/min/m2  Vent Mode: PRVC FiO2 (%):  [40 %-50 %] 40 % Set Rate:  [16 bmp] 16 bmp Vt Set:  [550 mL] 550 mL PEEP:  [5 cmH20] 5 cmH20 Plateau Pressure:  [23 cmH20-24 cmH20] 23 cmH20   Intake/Output Summary (Last 24 hours) at 11/01/2019 [id number] data filed at 11/01/2019 0800 Gross per 24 hour  Intake 5017.74 ml  Output 3785 ml  Net 1232.74 ml   Filed Weights   10/29/19 0500 10/30/19 0500 10/31/19 0558  Weight: 87.8 kg 86.8 kg 92.3 kg    Examination: Gen:      No acute distress HEENT:  EOMI, sclera anicteric Neck:     No masses; no thyromegaly Lungs:    Clear to auscultation bilaterally; normal respiratory effort CV:         Regular rate and rhythm; no murmurs Abd:      + bowel sounds; soft, non-tender; no palpable masses, no distension Ext:    No edema; adequate peripheral perfusion Skin:      Warm and dry; no rash Neuro: Sedated  Resolved Hospital Problem list   Pericardial tamponade--2/2 LAD perforation  Assessment & Plan:   Cardiogenic shock. EF 45% prior to OR. Post Vfib arrest echo suggestive of biventricular failure. Decompensated heart failure s/p thoracotomy for repair of perforation of LAD during PCI. On V-A ECMO since 6/2 and off 6/7 s/p  decannulation and now with 5.5 Impella LVAD. - Continue management per cards / TCTS. - Repeat echo today  ICM, acute HFrEF,  CAD, h/o HTN.  NSTEMI s/p DES to RCA 5/25.  Cardiogenic shock; likely distributive shock from sedation contributing. -Continue inotropes and vasopressors as needed to maintain mean arterial pressure than 65. -Continue telemetry monitoring.  Acute hypoxic respiratory failure requiring mechanical ventilation.  Tobacco use--1ppd smoker. - Continue Low tidal volume  ventilation. -Adult ventilator protocol. -Pulmonary hygiene. -Wean FiO2 and PEEP as tolerated - PSV weans as tolerated  High risk alcohol use. Risk for ETOH w/d. -Sedation with fentanyl Versed. - Thiamine / Folate.  High risk malnutrition. -Trickle tube feeds.  Elevated transaminases likely due to cardiac failure-improving. -Continue to monitor.  Absent Bowel Sounds - ? Ileus. - Post pyloric feeding tube being placed now. - Assess KUB. - Continue reglan, neostigmine.  Best practice:  Diet: NPO. Tube feeds Pain/Anxiety/Delirium protocol (if indicated): dilaudid, precedex, versed VAP protocol (if indicated): yes DVT prophylaxis: heparin gtt GI prophylaxis: pepcid Glucose control: per primary Mobility: BR Code Status: Full Family Communication: Per primary Disposition: ICU  The patient is critically ill with multiple organ system failure and requires high complexity decision making for assessment and support, frequent evaluation and titration of therapies, advanced monitoring, review of radiographic studies and interpretation of complex data.   Critical Care Time devoted to patient care services, exclusive of separately billable procedures, described in this note is 35 minutes.   Marshell Garfinkel MD [facility] Please see [email or web address] for pager details.  11/01/2019, 8:52 AM

## 2019-11-01 NOTE — Progress Notes (Signed)
2 Days Post-Op Procedure(s) (LRB): DECANNULATION OF ECMO (EXTRACORPOREAL MEMBRANE OXYGENATION) (N/A) TRANSESOPHAGEAL ECHOCARDIOGRAM (TEE) (N/A) Placement Of Impella Left Ventricular Assist Device 5.5 USING 10MM HEMASHIELD PLATINUM GRAFT Sternal Closure (N/A) Subjective: Patient sedated on ventilator Slowly weaning inotropes, Impella 5.5 flow 3.2 L Echo today shows continued improvement in LV with decreased LV size Hope to wean inotropes and start to wean Impella before removal possibly Friday  Objective: Vital signs in last 24 hours: Temp:  [98.1 F (36.7 C)-100.9 F (38.3 C)] 98.6 F (37 C) (06/09 1200) Pulse Rate:  [70-88] 76 (06/09 1500) Cardiac Rhythm: Normal sinus rhythm (06/09 1200) Resp:  [16-20] 16 (06/09 1500) BP: (80-123)/(58-72) 80/58 (06/09 1500) SpO2:  [96 %-100 %] 97 % (06/09 1500) Arterial Line BP: (63-134)/(46-75) 94/53 (06/09 1500) FiO2 (%):  [40 %-50 %] 40 % (06/09 1526)  Hemodynamic parameters for last 24 hours: PAP: (22-31)/(12-22) 26/22 CVP:  [6 mmHg-17 mmHg] 10 mmHg CO:  [4.8 L/min-6.5 L/min] 5.6 L/min CI:  [2.4 L/min/m2-3.3 L/min/m2] 2.8 L/min/m2  Intake/Output from previous day: 06/08 0701 - 06/09 0700 In: 4795.4 [I.V.:2549.6; NG/GT:651.8; IV Piggyback:1309] Out: 3810 [Urine:3100; Emesis/NG output:300; Chest Tube:410] Intake/Output this shift: Total I/O In: 1916.9 [I.V.:672.2; Other:97.6; NG/GT:221.3; IV Piggyback:925.9] Out: 1170 [Urine:1130; Chest Tube:40]       Exam    General- alert and comfortable    Neck- no JVD, no cervical adenopathy palpable, no carotid bruit   Lungs- clear without rales, wheezes   Cor- regular rate and rhythm, no murmur , gallop   Abdomen- soft, non-tender   Extremities - warm, non-tender, minimal edema   Neuro- oriented, appropriate, no focal weakness   Lab Results: Recent Labs    10/31/19 1606 10/31/19 1606 11/01/19 0500 11/01/19 0511  WBC 12.3*  --  13.8*  --   HGB 8.5*   < > 8.6* 8.2*  HCT 26.2*   < >  26.6* 24.0*  PLT 165  --  190  --    < > = values in this interval not displayed.   BMET:  Recent Labs    10/31/19 1606 10/31/19 1606 11/01/19 0500 11/01/19 0511  NA 136   < > 137 137  K 4.2   < > 3.8 3.9  CL 103  --  98  --   CO2 24  --  24  --   GLUCOSE 130*  --  128*  --   BUN 8  --  9  --   CREATININE 1.09  --  1.00  --   CALCIUM 7.8*  --  7.9*  --    < > = values in this interval not displayed.    PT/INR:  Recent Labs    11/01/19 0500  LABPROT 14.3  INR 1.2   ABG    Component Value Date/Time   PHART 7.399 11/01/2019 0511   HCO3 29.3 (H) 11/01/2019 0511   TCO2 31 11/01/2019 0511   ACIDBASEDEF 1.0 10/31/2019 0644   O2SAT 60.2 11/01/2019 0511   O2SAT 96.0 11/01/2019 0511   CBG (last 3)  Recent Labs    11/01/19 0508 11/01/19 0747 11/01/19 1124  GLUCAP 102* 156* 116*    Assessment/Plan: S/P Procedure(s) (LRB): DECANNULATION OF ECMO (EXTRACORPOREAL MEMBRANE OXYGENATION) (N/A) TRANSESOPHAGEAL ECHOCARDIOGRAM (TEE) (N/A) Placement Of Impella Left Ventricular Assist Device 5.5 USING 10MM HEMASHIELD PLATINUM GRAFT Sternal Closure (N/A) DC Swan and sheath since they replaced emergently while patient was pressing on cardiopulmonary bypass with tamponade attempt to change a line as well. Continue broad-spectrum antibiotics  and nutrition Continue cangrelor for recent PCI of RCA, heparin purge through Impella  LOS: 7 days    William Crawford 11/01/2019

## 2019-11-01 NOTE — Procedures (Signed)
Arterial Catheter Insertion Procedure Note William Crawford [id number] [birth year]  Procedure: Insertion of Arterial Catheter  Indications: Blood pressure monitoring and Frequent blood sampling  Procedure Details Consent: Risks of procedure as well as the alternatives and risks of each were explained to the (patient/caregiver).  Consent for procedure obtained. Time Out: Verified patient identification, verified procedure, site/side was marked, verified correct patient position, special equipment/implants available, medications/allergies/relevent history reviewed, required imaging and test results available.  Performed  Maximum sterile technique was used including gloves, gown and mask. Skin prep: Chlorhexidine; local anesthetic administered 20 gauge catheter was inserted into left radial artery using the Seldinger technique. ULTRASOUND GUIDANCE USED: YES Evaluation Blood flow good; BP tracing good. Complications: No apparent complications.   Revonda Standard 11/01/2019

## 2019-11-01 NOTE — Progress Notes (Addendum)
Pharmacy Antibiotic Note  William Crawford is a 49 y.o. male admitted on 10/25/2019 with [facility] ECMO- now de-cannulated. Pharmacy has been consulted for Vancomycin.   Currently on day #7 of antibiotics. WBC today 13.8, afebrile. Scr stable at 1 today. No growth in cx. Vancomycin trough came back therapeutic at 19 on 1g IV every 8 hours.   Plan: Meropenem 1gm IV q8h Contiue vancomycin 1072m IV q8h -Will follow renal function and clinical progress - f/u duration of antibiotics   Height: 5' 8.5" (174 cm) Weight: 92.3 kg (203 lb 7.8 oz) IBW/kg (Calculated) : 69.55  Temp (24hrs), Avg:99.7 F (37.6 C), Min:98.1 F (36.7 C), Max:100.9 F (38.3 C)  Recent Labs  Lab 10/27/19 0351 10/27/19 1556 10/28/19 0408 10/28/19 1330 10/28/19 1650 10/29/19 0429 10/29/19 1714 10/30/19 0427 10/30/19 0427 10/30/19 0814 10/30/19 1355 10/30/19 1809 10/31/19 0357 10/31/19 0359 10/31/19 1606 11/01/19 0500  WBC 6.2   < > 5.8  --    < > 7.2   < > 7.9   < >  --  8.9 8.7 10.9*  --  12.3* 13.8*  CREATININE 0.81   < > 0.83  --    < > 0.79   < > 0.95   < > 0.80 0.84  --  1.15  --  1.09 1.00  LATICACIDVEN 0.7  --  0.8  --   --  1.0  --  0.9  --   --   --   --   --  0.9  --   --   VANCOTROUGH  --   --   --  16  --   --   --   --   --   --   --   --   --   --   --  19   < > = values in this interval not displayed.    Estimated Creatinine Clearance: 101.7 mL/min (by C-G formula based on SCr of 1 mg/dL).    No Known Allergies  Antimicrobials this admission: Cefuroxime 6/2 x2 doses Cefepime 6/2 >>6/8 Vancomycin 6/2 >> Meropenem 6/8>  Dose adjustments this admission: 6/5- vancomycin trough= 16, no adjustment needed 6/9- vancomycin trough= 19, no adjustment needed  Microbiology results: 6/7 Mediastinal fluid: NGTD 6/8 BCx: NGTD 6/8 Trach Aspirate: NG  KAntonietta Jewel PharmD, BCCCP Clinical Pharmacist  Phone: [phone]/01/2020 1:29 PM  Please check AMION for all [place] numbers After  10:00 PM, call [phone]

## 2019-11-01 NOTE — Progress Notes (Signed)
Gastric residuals were 353m and 50103mat midnight and 0600 respectively. Residulas given back to patient.

## 2019-11-01 NOTE — Progress Notes (Signed)
ANTICOAGULATION CONSULT NOTE   Pharmacy Consult for IV Heparin Indication: ECMO > impella  No Known Allergies  Patient Measurements: Height: 5' 8.5" (174 cm) Weight: 92.3 kg (203 lb 7.8 oz) IBW/kg (Calculated) : 69.55 Heparin Dosing Weight: 83.9 kg  Vital Signs: Temp: 98.1 F (36.7 C) (06/09 0700) BP: 123/72 (06/09 0429) Pulse Rate: 79 (06/09 0700)  Labs: Recent Labs    10/29/19 1714 10/29/19 1948 10/30/19 0427 10/30/19 0427 10/30/19 0806 10/30/19 1355 10/30/19 1700 10/30/19 1706 10/31/19 0357 10/31/19 0644 10/31/19 1606 10/31/19 1606 11/01/19 0500 11/01/19 0511  HGB 9.8*   < > 9.1*  --    < > 8.3*  --    < > 9.2*   < > 8.5*   < > 8.6* 8.2*  HCT 30.1*   < > 28.2*  --    < > 25.3*  --    < > 27.4*   < > 26.2*  --  26.6* 24.0*  PLT 127*   < > 124*  --    < > 118*  --    < > 148*  --  165  --  190  --   APTT 88*  --  82*  --   --   --   --   --   --   --   --   --   --   --   LABPROT  --   --  14.3  --   --   --   --   --  13.8  --   --   --  14.3  --   INR  --   --  1.2  --   --   --   --   --  1.1  --   --   --  1.2  --   HEPARINUNFRC 0.34   < > 0.25*   < >  --   --  <0.10*  --  <0.10*  --  <0.10*  --   --   --   CREATININE 0.93   < > 0.95  --    < > 0.84  --   --  1.15  --  1.09  --   --   --    < > = values in this interval not displayed.    Estimated Creatinine Clearance: 93.3 mL/min (by C-G formula based on SCr of 1.09 mg/dL).   Medical History: Past Medical History:  Diagnosis Date  . Alcohol abuse   . GERD (gastroesophageal reflux disease)   . Tobacco abuse     Assessment: 49 year old male initiated on [place] ECMO> decannulated to impella 6/7.  Heparin running through purge solution, 29m/hr = 600 uts/hr. Still absent bowel sounds and concern for not absorbing clopidogrel - new stent 5/25. Per discussion with providers, continue to hold off on systemic heparin for now and covering stent with cangrelor 0.75 mcg/kg/min. Hgb low stable 8.2, pltc up to 190, LDH  trend down to 372 yesterday, fibrinogen trend down to 697 on last check.  Goal of Therapy:  Heparin Level goal 0.3-0.5 Wait until able to take plavix prior to adding systemic heparin Monitor platelets by anticoagulation protocol: Yes   Plan:  Continue heparin in purge F/u starting systemic heparin as appropriate when back on clopidogrel Continue cangrelor 0.762m/kg/min Check heparin level and CBC every 12 hours (5am/5pm) Monitor for s/sx bleeding  KiAntonietta JewelPharmD, BCEllsworthlinical Pharmacist  Phone: [phone]/01/2020 7:51 AM  Please check AMION for all [place] phone numbers After 10:00 PM, call [place] [phone]

## 2019-11-01 NOTE — Progress Notes (Addendum)
Patient ID: OWAIS PRUETT, male   DOB: Nov 04, 1970, 49 y.o.   MRN: [id number]     Advanced Heart Failure Rounding Note  PCP-Cardiologist: No primary care provider on file.   Subjective:    - LAD repair + [facility] ECMO cannulation in OR 6/3. Chest wall open. - 6/7: ECMO decannulation, Impella 5.5 placement   This morning, he is on epinephrine 2, milrinone 0.375, NE 7.  I/Os mildly positive.   Tm 100.2, CXR with right effusion. Now on vancomycin/meropenem.   No bowel sounds, ?if he absorbs any of the meds via OGT. Therefore, on Cangrelor.   He will awaken and follow commands.   LDH 662 => 594 => 548 => 514 => 470 => 372 => P hgb 8.2 => 8.3 => 8.7 => 8.9 => 9.1 => 9.2 => 8.6 Creatinine 0.99 => 0.81 => 0.83 => 0.79 => 0.95 => 1.15 => P Plts higher 190  - Echo (6/3): EF < 20%, severe RV dysfunction - Echo (6/6): EF 20-25%, normal RV function - Echo (6/8): EF 25-30%, normal RV  Swan: CVP 12-13 PA not accurate CI 2.8 Co-ox 60%  Impella P6 Flow 3.1 L/min No alarms  Objective:   Weight Range: 92.3 kg Body mass index is 30.49 kg/m.   Vital Signs:   Temp:  [98.1 F (36.7 C)-100.9 F (38.3 C)] 98.1 F (36.7 C) (06/09 0800) Pulse Rate:  [77-90] 77 (06/09 0800) Resp:  [0-23] 16 (06/09 0800) BP: (102-123)/(61-72) 123/72 (06/09 0429) SpO2:  [96 %-100 %] 98 % (06/09 0800) Arterial Line BP: (63-134)/(46-82) 129/75 (06/09 0700) FiO2 (%):  [40 %-50 %] 40 % (06/09 0429) Last BM Date: (PTA)  Weight change: Filed Weights   10/29/19 0500 10/30/19 0500 10/31/19 0558  Weight: 87.8 kg 86.8 kg 92.3 kg    Intake/Output:   Intake/Output Summary (Last 24 hours) at 11/01/2019 0809 Last data filed at 11/01/2019 0800 Gross per 24 hour  Intake 5017.74 ml  Output 3785 ml  Net 1232.74 ml      Physical Exam    General: NAD Neck: JVP 10 cm, no thyromegaly or thyroid nodule.  Lungs: Decreased at bases.  CV: Nondisplaced PMI.  Heart regular S1/S2, no S3/S4, no murmur.  Trace ankle  edema.  N Abdomen: Soft, no hepatosplenomegaly, no distention.  Skin: Intact without lesions or rashes.  Neurologic: Follows commands.  Extremities: No clubbing or cyanosis.  HEENT: Normal.    Telemetry   NSR 80s (personally reviewed)  Labs    CBC Recent Labs    10/31/19 1606 10/31/19 1606 11/01/19 0500 11/01/19 0511  WBC 12.3*  --  13.8*  --   HGB 8.5*   < > 8.6* 8.2*  HCT 26.2*   < > 26.6* 24.0*  MCV 87.9  --  88.4  --   PLT 165  --  190  --    < > = values in this interval not displayed.   Basic Metabolic Panel Recent Labs    10/29/19 2259 10/30/19 0425 10/30/19 0427 10/30/19 0806 10/31/19 0357 10/31/19 0644 10/31/19 1606 11/01/19 0511  NA 140   < > 140   < > 136   < > 136 137  K 3.6   < > 4.7   < > 4.2   < > 4.2 3.9  CL 103  --  105   < > 104  --  103  --   CO2 23  --  22   < > 22  --  24  --   GLUCOSE 110*  --  96   < > 123*  --  130*  --   BUN 9  --  10   < > 8  --  8  --   CREATININE 0.93  --  0.95   < > 1.15  --  1.09  --   CALCIUM 8.4*  --  8.6*   < > 7.8*  --  7.8*  --   MG 2.0  --  2.3  --   --   --   --   --   PHOS  --   --  4.1  --   --   --   --   --    < > = values in this interval not displayed.   Liver Function Tests Recent Labs    10/30/19 0427 10/31/19 0357  AST 33 42*  ALT 40 37  ALKPHOS 61 55  BILITOT 1.2 1.2  PROT 5.4* 5.4*  ALBUMIN 2.3* 2.4*   No results for input(s): LIPASE, AMYLASE in the last 72 hours. Cardiac Enzymes No results for input(s): CKTOTAL, CKMB, CKMBINDEX, TROPONINI in the last 72 hours.  BNP: BNP (last 3 results) Recent Labs    10/17/19 0605  BNP 608.8*    ProBNP (last 3 results) No results for input(s): PROBNP in the last 8760 hours.   D-Dimer No results for input(s): DDIMER in the last 72 hours. Hemoglobin A1C No results for input(s): HGBA1C in the last 72 hours. Fasting Lipid Panel No results for input(s): CHOL, HDL, LDLCALC, TRIG, CHOLHDL, LDLDIRECT in the last 72 hours. Thyroid Function  Tests No results for input(s): TSH, T4TOTAL, T3FREE, THYROIDAB in the last 72 hours.  Invalid input(s): FREET3  Other results:   Imaging    DG Abd 1 View  Result Date: 10/31/2019 CLINICAL DATA:  Tube placement. EXAM: ABDOMEN - 1 VIEW; PORTABLE ABDOMEN - 1 VIEW COMPARISON:  Earlier same day FINDINGS: Nasogastric tube tip is in the gastric antrum. Soft feeding tube is directed to the region of the pylorus but could not be advanced across pylorus. Contrast injection shows antral positioning. Subsequent radiograph shows the soft feeding tube at the antral pylorus junction. Some contrast present within the stomach. Nasogastric tube tip is in the antrum. Gas pattern otherwise unremarkable. IMPRESSION: Soft feeding tube placed only to the level of the antral pylorus junction. Tube could not be passed through the pylorus. Electronically Signed   By: Nelson Chimes M.D.   On: 10/31/2019 13:56   DG Abd Portable 1V  Result Date: 10/31/2019 CLINICAL DATA:  Tube placement. EXAM: ABDOMEN - 1 VIEW; PORTABLE ABDOMEN - 1 VIEW COMPARISON:  Earlier same day FINDINGS: Nasogastric tube tip is in the gastric antrum. Soft feeding tube is directed to the region of the pylorus but could not be advanced across pylorus. Contrast injection shows antral positioning. Subsequent radiograph shows the soft feeding tube at the antral pylorus junction. Some contrast present within the stomach. Nasogastric tube tip is in the antrum. Gas pattern otherwise unremarkable. IMPRESSION: Soft feeding tube placed only to the level of the antral pylorus junction. Tube could not be passed through the pylorus. Electronically Signed   By: Nelson Chimes M.D.   On: 10/31/2019 13:56   ECHOCARDIOGRAM LIMITED  Result Date: 10/31/2019    ECHOCARDIOGRAM LIMITED REPORT   Patient Name:   SHANN MERRICK Date of Exam: 10/31/2019 Medical Rec #:  [id number]  Height:       68.5 in Accession #:    [id number]        Weight:       203.5 lb Date of Birth:   August 11, 1970         BSA:          2.070 m Patient Age:    49 years          BP:           113/82 mmHg Patient Gender: M                 HR:           90 bpm. Exam Location:  Inpatient Procedure: Limited Echo Indications:    CHF-Acute Systolic 283.15 / V76.16  History:        Patient has prior history of Echocardiogram examinations, most                 recent 10/29/2019. CAD; Risk Factors:Dyslipidemia, Hypertension                 and Current Smoker. LVAD, NSTEMI (non-ST elevated myocardial                 infarction, Cardiogenic shock , Ischemic cardiomyopathy,                 impella.  Sonographer:    Leavy Cella Referring Phys: [place]  1. Limited TTE to assess Impella positoning. Impella extends 5.4 cm from AV into LV cavity. Left ventricular ejection fraction, by estimation, is 25 to 30%. The left ventricle has severely decreased function. FINDINGS  Left Ventricle: Limited TTE to assess Impella positoning. Impella extends 5.4 cm from AV into LV cavity. Left ventricular ejection fraction, by estimation, is 25 to 30%. The left ventricle has severely decreased function. Pericardium: Trivial pericardial effusion is present. Oswaldo Milian MD Electronically signed by Oswaldo Milian MD Signature Date/Time: 10/31/2019/2:12:33 PM    Final      Medications:     Scheduled Medications: . aspirin  81 mg Per Tube Daily  . atorvastatin  40 mg Per Tube Daily  . bisacodyl  10 mg Oral Daily   Or  . bisacodyl  10 mg Rectal Daily  . chlorhexidine gluconate (MEDLINE KIT)  15 mL Mouth Rinse BID  . Chlorhexidine Gluconate Cloth  6 each Topical Daily  . docusate  200 mg Per Tube Daily  . folic acid  1 mg Intravenous Daily  . furosemide  40 mg Intravenous BID  . insulin aspart  0-9 Units Subcutaneous Q4H  . ketotifen  1 drop Both Eyes BID  . mouth rinse  15 mL Mouth Rinse 10 times per day  . metoCLOPramide (REGLAN) injection  10 mg Intravenous Q8H  . mupirocin ointment   Nasal  BID  . neostigmine  0.25 mg Subcutaneous Q6H  . pantoprazole (PROTONIX) IV  40 mg Intravenous QHS  . sodium chloride flush  10-40 mL Intracatheter Q12H  . sodium chloride flush  3 mL Intravenous Q12H  . thiamine injection  100 mg Intravenous Daily    Infusions: . sodium chloride    . sodium chloride Stopped (10/28/19 0227)  . sodium chloride    . sodium chloride 10 mL/hr at 11/01/19 0800  . sodium chloride 10 mL/hr at 11/01/19 0800  . acetaminophen Stopped (11/01/19 0622)  . amiodarone 30 mg/hr (11/01/19 0800)  . dexmedetomidine (PRECEDEX) IV infusion 0.5 mcg/kg/hr (11/01/19 0800)  .  epinephrine 3 mcg/min (11/01/19 0800)  . feeding supplement (VITAL 1.5 CAL) 30 mL/hr at 10/31/19 1700  . fentaNYL infusion INTRAVENOUS Stopped (10/30/19 1244)  . impella catheter heparin 50 unit/mL in dextrose 5%    . HYDROmorphone 2 mg/hr (11/01/19 0800)  . lactated ringers Stopped (10/28/19 0624)  . lactated ringers    . meropenem (MERREM) IV 1 g (11/01/19 0530)  . midazolam 2 mg/hr (11/01/19 0800)  . milrinone 0.375 mcg/kg/min (11/01/19 0800)  . norepinephrine (LEVOPHED) Adult infusion 8 mcg/min (11/01/19 0800)  . vancomycin Stopped (11/01/19 0724)    PRN Medications: sodium chloride, sodium chloride, HYDROmorphone, midazolam, ondansetron (ZOFRAN) IV, ondansetron (ZOFRAN) IV, sodium chloride flush, sodium chloride flush   Assessment/Plan   1. Cardiogenic shock: Pre-OR echo with EF 45%, apical hypokinesis.  TEE in OR after VF arrest showed severe biventricular dysfunction. Placed on ECMO. Repeat echo 6/6 with EF 20-25%, RV function improved/near normal. Decannulated to Impella 5.5 on 6/7.  This morning, CI 2.8 with co-ox 60%.  He remains on epinephrine 2, milrinone 0.375, NE 7. MAP stable, CVP 12-13. LDH and BMET pending.    - Continue Impella P6, heparin only in purge while on IV Cangrelor. Check position under echo.  - Continue to wean NE and epinephrine, doses coming down.  - Continue current  milrinone.   - Lasix 40 mg IV bid today.  - Pull Swan today, will use PICC line for CVP/co-ox.  2. Pericardial tamponade: Due to LAD perforation.  Now resolved in OR.  3. CAD: S/p DES to distal RCA on 5/25.  Has known CTO OM.  Had attempted intervention on CTO LAD today that was complicated by LAD perforation, now s/p LAD repair.  The LAD was not graftable, LAD territory therefore is not perfused except by pre-existing collaterals. Chest remains open. - Continue ASA 81/Cangrelor given recent RCA stent, transition back to Plavix when we think his gut is absorbing.  - Continue statin, Crestor 20.   4. ETOH abuse: Watch for withdrawal.   5. Smoking: Will need to quit.  6. Acute hypoxemic respiratory failure: Per CCM.   7. Neuro: Will wake up and follow commands.  8. GI: Suspect ileus.  No bowel sounds.  Concerned he is not absorbing meds.  - Currently getting trickle feeds. - IV Reglan and neostigmine.  9. VF arrest: In OR.  Occasional PVCs with increase in Impella speed.  - Off lidocaine.  10. ID: Tm 100.2, CXR with right effusion.  - Continue vancomycin/meropenem.  - No culture data yet.   CRITICAL CARE Performed by: Loralie Champagne  Total critical care time: 40 minutes  Critical care time was exclusive of separately billable procedures and treating other patients.  Critical care was necessary to treat or prevent imminent or life-threatening deterioration.  Critical care was time spent personally by me on the following activities: development of treatment plan with patient and/or surrogate as well as nursing, discussions with consultants, evaluation of patient's response to treatment, examination of patient, obtaining history from patient or surrogate, ordering and performing treatments and interventions, ordering and review of laboratory studies, ordering and review of radiographic studies, pulse oximetry and re-evaluation of patient's condition.   Length of Stay: 7  Loralie Champagne, MD   11/01/2019, 8:09 AM  Advanced Heart Failure Team Pager [phone] (M-F; 7a - 4p)  Please contact [facility] for night-coverage after hours (4p -7a ) and weekends on [email or web address]

## 2019-11-01 NOTE — Progress Notes (Signed)
  Echocardiogram 2D Echocardiogram has been performed.  Randa Lynn Melanie Pellot 11/01/2019, 9:33 AM

## 2019-11-02 ENCOUNTER — Inpatient Hospital Stay (HOSPITAL_COMMUNITY): Payer: BLUE CROSS/BLUE SHIELD

## 2019-11-02 ENCOUNTER — Ambulatory Visit: Payer: BLUE CROSS/BLUE SHIELD | Admitting: Physician Assistant

## 2019-11-02 DIAGNOSIS — I5021 Acute systolic (congestive) heart failure: Secondary | ICD-10-CM

## 2019-11-02 LAB — BASIC METABOLIC PANEL
Anion gap: 8 (ref 5–15)
Anion gap: 10 (ref 5–15)
BUN: 12 mg/dL (ref 6–20)
BUN: 12 mg/dL (ref 6–20)
CO2: 27 mmol/L (ref 22–32)
CO2: 26 mmol/L (ref 22–32)
Calcium: 7.7 mg/dL — ABNORMAL LOW (ref 8.9–10.3)
Calcium: 7.9 mg/dL — ABNORMAL LOW (ref 8.9–10.3)
Chloride: 101 mmol/L (ref 98–111)
Chloride: 100 mmol/L (ref 98–111)
Creatinine, Ser: 1.06 mg/dL (ref 0.61–1.24)
Creatinine, Ser: 0.95 mg/dL (ref 0.61–1.24)
GFR calc Af Amer: >60 mL/min (ref >60)
GFR calc Af Amer: >60 mL/min (ref >60)
GFR calc non Af Amer: >60 mL/min (ref >60)
GFR calc non Af Amer: >60 mL/min (ref >60)
Glucose, Bld: 111 mg/dL — ABNORMAL HIGH (ref 70–99)
Glucose, Bld: 120 mg/dL — ABNORMAL HIGH (ref 70–99)
Potassium: 3.6 mmol/L (ref 3.5–5.1)
Potassium: 3.7 mmol/L (ref 3.5–5.1)
Sodium: 136 mmol/L (ref 135–145)
Sodium: 136 mmol/L (ref 135–145)

## 2019-11-02 LAB — POCT I-STAT 7, (LYTES, BLD GAS, ICA,H+H)
Acid-Base Excess: 6 mmol/L — ABNORMAL HIGH (ref 0.0–2.0)
Bicarbonate: 30.4 mmol/L — ABNORMAL HIGH (ref 20.0–28.0)
Calcium, Ion: 1.09 mmol/L — ABNORMAL LOW (ref 1.15–1.40)
HCT: 26 % — ABNORMAL LOW (ref 39.0–52.0)
Hemoglobin: 8.8 g/dL — ABNORMAL LOW (ref 13.0–17.0)
O2 Saturation: 94 %
Patient temperature: 100.2
Potassium: 3.8 mmol/L (ref 3.5–5.1)
Sodium: 137 mmol/L (ref 135–145)
TCO2: 32 mmol/L (ref 22–32)
pCO2 arterial: 45.5 mmHg (ref 32.0–48.0)
pH, Arterial: 7.436 (ref 7.350–7.450)
pO2, Arterial: 75 mmHg — ABNORMAL LOW (ref 83.0–108.0)

## 2019-11-02 LAB — GLUCOSE, CAPILLARY
Glucose-Capillary: 100 mg/dL — ABNORMAL HIGH (ref 70–99)
Glucose-Capillary: 106 mg/dL — ABNORMAL HIGH (ref 70–99)
Glucose-Capillary: 107 mg/dL — ABNORMAL HIGH (ref 70–99)
Glucose-Capillary: 108 mg/dL — ABNORMAL HIGH (ref 70–99)
Glucose-Capillary: 109 mg/dL — ABNORMAL HIGH (ref 70–99)
Glucose-Capillary: 113 mg/dL — ABNORMAL HIGH (ref 70–99)
Glucose-Capillary: 120 mg/dL — ABNORMAL HIGH (ref 70–99)
Glucose-Capillary: 390 mg/dL — ABNORMAL HIGH (ref 70–99)
Glucose-Capillary: 99 mg/dL (ref 70–99)

## 2019-11-02 LAB — CULTURE, RESPIRATORY W GRAM STAIN

## 2019-11-02 LAB — HEPARIN LEVEL (UNFRACTIONATED)
Heparin Unfractionated: <0.1 IU/mL — ABNORMAL LOW (ref 0.30–0.70)
Heparin Unfractionated: <0.1 IU/mL — ABNORMAL LOW (ref 0.30–0.70)

## 2019-11-02 LAB — COOXEMETRY PANEL
Carboxyhemoglobin: 1.3 % (ref 0.5–1.5)
Methemoglobin: 0.7 % (ref 0.0–1.5)
O2 Saturation: 63.6 %
Total hemoglobin: 8 g/dL — ABNORMAL LOW (ref 12.0–16.0)

## 2019-11-02 LAB — HEPATIC FUNCTION PANEL
ALT: 59 U/L — ABNORMAL HIGH (ref 0–44)
AST: 89 U/L — ABNORMAL HIGH (ref 15–41)
Albumin: 2 g/dL — ABNORMAL LOW (ref 3.5–5.0)
Alkaline Phosphatase: 107 U/L (ref 38–126)
Bilirubin, Direct: 0.3 mg/dL — ABNORMAL HIGH (ref 0.0–0.2)
Indirect Bilirubin: 0.5 mg/dL (ref 0.3–0.9)
Total Bilirubin: 0.8 mg/dL (ref 0.3–1.2)
Total Protein: 5.3 g/dL — ABNORMAL LOW (ref 6.5–8.1)

## 2019-11-02 LAB — CBC
HCT: 27.6 % — ABNORMAL LOW (ref 39.0–52.0)
HCT: 27.4 % — ABNORMAL LOW (ref 39.0–52.0)
Hemoglobin: 9.1 g/dL — ABNORMAL LOW (ref 13.0–17.0)
Hemoglobin: 9 g/dL — ABNORMAL LOW (ref 13.0–17.0)
MCH: 29.4 pg (ref 26.0–34.0)
MCH: 29.2 pg (ref 26.0–34.0)
MCHC: 33 g/dL (ref 30.0–36.0)
MCHC: 32.8 g/dL (ref 30.0–36.0)
MCV: 89 fL (ref 80.0–100.0)
MCV: 89 fL (ref 80.0–100.0)
Platelets: 207 10*3/uL (ref 150–400)
Platelets: 223 10*3/uL (ref 150–400)
RBC: 3.1 MIL/uL — ABNORMAL LOW (ref 4.22–5.81)
RBC: 3.08 MIL/uL — ABNORMAL LOW (ref 4.22–5.81)
RDW: 14.9 % (ref 11.5–15.5)
RDW: 14.8 % (ref 11.5–15.5)
WBC: 15.2 10*3/uL — ABNORMAL HIGH (ref 4.0–10.5)
WBC: 14.3 10*3/uL — ABNORMAL HIGH (ref 4.0–10.5)
nRBC: 0.4 % — ABNORMAL HIGH (ref 0.0–0.2)
nRBC: 0.4 % — ABNORMAL HIGH (ref 0.0–0.2)

## 2019-11-02 LAB — PROTIME-INR
INR: 1.1 (ref 0.8–1.2)
Prothrombin Time: 13.3 seconds (ref 11.4–15.2)

## 2019-11-02 LAB — ECHOCARDIOGRAM LIMITED
Height: 68.5 in
Weight: 3301.61 oz

## 2019-11-02 LAB — LACTATE DEHYDROGENASE: LDH: 356 U/L — ABNORMAL HIGH (ref 98–192)

## 2019-11-02 LAB — PREPARE RBC (CROSSMATCH)

## 2019-11-02 LAB — MAGNESIUM: Magnesium: 2.3 mg/dL (ref 1.7–2.4)

## 2019-11-02 MED ORDER — POTASSIUM CHLORIDE 10 MEQ/50ML IV SOLN
10.0000 meq | INTRAVENOUS | Status: AC
  Administered 2019-11-02 (×3): 10 meq via INTRAVENOUS
  Filled 2019-11-02: qty 50

## 2019-11-02 MED ORDER — TRACE MINERALS CU-MN-SE-ZN 300-55-60-3000 MCG/ML IV SOLN
INTRAVENOUS | Status: AC
  Filled 2019-11-02: qty 467.2

## 2019-11-02 MED ORDER — TRAVASOL 10 % IV SOLN: INTRAVENOUS | Status: DC

## 2019-11-02 MED ORDER — SORBITOL 70 % SOLN: 30.0000 mL | Freq: Every day | Status: DC | PRN

## 2019-11-02 MED ORDER — NITROGLYCERIN IN D5W 200-5 MCG/ML-% IV SOLN
INTRAVENOUS | Status: AC
  Administered 2019-11-02: 50 mg
  Filled 2019-11-02: qty 250

## 2019-11-02 MED ORDER — SORBITOL 70 % SOLN
960.0000 mL | TOPICAL_OIL | Freq: Once | ORAL | Status: DC
  Filled 2019-11-02: qty 473

## 2019-11-02 MED ORDER — CEFAZOLIN SODIUM-DEXTROSE 2-4 GM/100ML-% IV SOLN
2.0000 g | INTRAVENOUS | Status: DC
  Filled 2019-11-02: qty 100

## 2019-11-02 MED ORDER — SORBITOL 70 % SOLN
30.0000 mL | Freq: Once | Status: AC
  Administered 2019-11-02: 30 mL via ORAL
  Filled 2019-11-02: qty 30

## 2019-11-02 MED ORDER — POTASSIUM CHLORIDE 10 MEQ/50ML IV SOLN
10.0000 meq | INTRAVENOUS | Status: AC
  Administered 2019-11-02 (×4): 10 meq via INTRAVENOUS
  Filled 2019-11-02 (×2): qty 50

## 2019-11-02 MED ORDER — NITROGLYCERIN IN D5W 200-5 MCG/ML-% IV SOLN
0.0000 ug/min | INTRAVENOUS | Status: DC
  Administered 2019-11-02: 20 ug/min via INTRAVENOUS

## 2019-11-02 MED ORDER — FUROSEMIDE 10 MG/ML IJ SOLN
80.0000 mg | Freq: Two times a day (BID) | INTRAMUSCULAR | Status: DC
  Administered 2019-11-02 – 2019-11-06 (×10): 80 mg via INTRAVENOUS
  Filled 2019-11-02 (×10): qty 8

## 2019-11-02 MED ORDER — ALTEPLASE 2 MG IJ SOLR
2.0000 mg | Freq: Once | INTRAMUSCULAR | Status: AC
  Administered 2019-11-02: 2 mg

## 2019-11-02 NOTE — Significant Event (Signed)
Patient has been off sedation (versed and dilaudid) from [phone], with the exception of precedex decreased to 0.19mg.   Patient then got progressively agitated during ECHO, reaching for ETT, BP elevated >190. Unable to redirect/educate patient. Sedation turned back on.  Nitroglycerin started for hypertension and Impella turned up to P5, as Impella monitor notiying retrograde flow.    Shealeigh Dunstan

## 2019-11-02 NOTE — Progress Notes (Signed)
[place] for IV Heparin Indication: ECMO > impella  No Known Allergies  Patient Measurements: Height: 5' 8.5" (174 cm) Weight: 93.6 kg (206 lb 5.6 oz) IBW/kg (Calculated) : 69.55 Heparin Dosing Weight: 83.9 kg  Vital Signs: Temp: 98.1 F (36.7 C) (06/10 1605) Temp Source: Oral (06/10 1605) BP: 129/75 (06/10 1530) Pulse Rate: 76 (06/10 1700)  Labs: Recent Labs    10/31/19 0357 10/31/19 0644 11/01/19 0500 11/01/19 0511 11/01/19 1600 11/01/19 1748 11/02/19 0513 11/02/19 0513 11/02/19 0514 11/02/19 1555  HGB 9.2*   < > 8.6*   < > 7.8*   < > 9.1*   < > 8.8* 9.0*  HCT 27.4*   < > 26.6*   < > 23.9*   < > 27.6*  --  26.0* 27.4*  PLT 148*   < > 190   < > 177  --  207  --   --  223  LABPROT 13.8  --  14.3  --   --   --  13.3  --   --   --   INR 1.1  --  1.2  --   --   --  1.1  --   --   --   HEPARINUNFRC <0.10*   < >  --    < > <0.10*  --  <0.10*  --   --  <0.10*  CREATININE 1.15   < > 1.00   < > 0.99  --  1.06  --   --  0.95   < > = values in this interval not displayed.    Estimated Creatinine Clearance: 107.7 mL/min (by C-G formula based on SCr of 0.95 mg/dL).   Medical History: Past Medical History:  Diagnosis Date  . Alcohol abuse   . GERD (gastroesophageal reflux disease)   . Tobacco abuse     Assessment: 49 year old male initiated on [place] ECMO> decannulated to impella 6/7.  Heparin running through purge solution, 11.9 ml/hr = 595 units/hr. Still absent bowel sounds and concern for not absorbing clopidogrel - new stent 5/25. Per discussion with providers, continue to hold off on systemic heparin for now and covering stent with cangrelor 0.75 mcg/kg/min. Hgb low/stable at 9.1, pltc up to 190, LDH trend down to 372 yesterday, fibrinogen trend down to 697 on last check.  Heparin level this eveningremains undetectable as expected. No bleeding or complications noted.  Goal of Therapy:  Wait until able to take plavix prior to  adding systemic heparin Monitor platelets by anticoagulation protocol: Yes   Plan:  Continue heparin in purge F/u starting systemic heparin as appropriate when back on clopidogrel Continue cangrelor 0.41mg/kg/min Check heparin level and CBC every 12 hours (5am/5pm) Monitor for s/sx bleeding  MBarth Kirks PharmD, BCPS, BCCCP Clinical Pharmacist [phone] Please check AMION for all MBridgeportnumbers  11/02/2019 5:29 PM

## 2019-11-02 NOTE — Progress Notes (Signed)
[place] for IV Heparin Indication: ECMO > impella  No Known Allergies  Patient Measurements: Height: 5' 8.5" (174 cm) Weight: 93.6 kg (206 lb 5.6 oz) IBW/kg (Calculated) : 69.55 Heparin Dosing Weight: 83.9 kg  Vital Signs: Temp: 100.3 F (37.9 C) (06/10 0422) Temp Source: Oral (06/10 0422) BP: 171/83 (06/10 0743) Pulse Rate: 89 (06/10 0743)  Labs: Recent Labs    10/31/19 0357 10/31/19 0644 11/01/19 0500 11/01/19 0511 11/01/19 1345 11/01/19 1600 11/01/19 1600 11/01/19 1748 11/02/19 0513  HGB 9.2*   < > 8.6*   < >  --  7.8*   < > 7.8* 9.1*  HCT 27.4*   < > 26.6*   < >  --  23.9*  --  23.0* 27.6*  PLT 148*   < > 190  --   --  177  --   --  207  LABPROT 13.8  --  14.3  --   --   --   --   --   --   INR 1.1  --  1.2  --   --   --   --   --   --   HEPARINUNFRC <0.10*   < >  --   --  <0.10* <0.10*  --   --  <0.10*  CREATININE 1.15   < > 1.00  --   --  0.99  --   --  1.06   < > = values in this interval not displayed.    Estimated Creatinine Clearance: 96.5 mL/min (by C-G formula based on SCr of 1.06 mg/dL).   Medical History: Past Medical History:  Diagnosis Date  . Alcohol abuse   . GERD (gastroesophageal reflux disease)   . Tobacco abuse     Assessment: 49 year old male initiated on [place] ECMO> decannulated to impella 6/7.  Heparin running through purge solution, 11.9 ml/hr = 595 units/hr. Still absent bowel sounds and concern for not absorbing clopidogrel - new stent 5/25. Per discussion with providers, continue to hold off on systemic heparin for now and covering stent with cangrelor 0.75 mcg/kg/min. Hgb low/stable at 9.1, pltc up to 190, LDH trend down to 372 yesterday, fibrinogen trend down to 697 on last check.  Heparin level this morning remains undetectable as expected. No bleeding or complications noted. Plan for SMOG enema to see if improves bowel function.   Goal of Therapy:  Wait until able to take plavix prior to  adding systemic heparin Monitor platelets by anticoagulation protocol: Yes   Plan:  Continue heparin in purge F/u starting systemic heparin as appropriate when back on clopidogrel Continue cangrelor 0.31mg/kg/min Check heparin level and CBC every 12 hours (5am/5pm) Monitor for s/sx bleeding  KAntonietta Jewel PharmD, BCCCP Clinical Pharmacist  Phone: [phone]/02/2020 12:05 PM  Please check AMION for all [place] numbers After 10:00 PM, call [phone]

## 2019-11-02 NOTE — Significant Event (Signed)
Per verbal order from Dr. Prescott Gum not to administer enemas r/t risk of Vtach. RN canceled order for enema today.    William Crawford

## 2019-11-02 NOTE — Progress Notes (Signed)
Patient has made attempts to pull out ET tube and has also reached for impella catheter. Patient re oriented to situation and educated on the need for the catheters and ET tube. Bolus of versed given.

## 2019-11-02 NOTE — Progress Notes (Addendum)
NAME:  William Crawford, MRN:  [id number], DOB:  09-Feb-1971, LOS: 8 ADMISSION DATE:  10/25/2019, CONSULTATION DATE:  10/25/19 REFERRING MD:  [place], CHIEF COMPLAINT:  Heart failure  Brief History   49 yo male admitted to ICU on 10/25/19 following CTO PCI to LAD at which time he unfortunately developed a perforated LAD and went into vfib arrest. He subsequently underwent emergent repair however was unable to be removed from bypass following the procedure. PCCM consulted for ECMO.  History of present illness   Pt initially presented with unstable angina on 5/25 at which time he underwent a heart cath which revealed occlusive disease involving the RCA as well as some chronic occlusions involving the first OM branch and proximal LAD. DES placed to the RCA lesion. He was subsequently discharged on DAPT and returned today for stent placement to the LAD.  He, unfortunately, developed a large perforation to the LAD during the procedure subsequently undergoing vfib arrest (time to ROSC 59mn) and was taken for emergent repair.  Past Medical History  Tobacco use, alcohol use, HFrEF, CAD s/p DES placement 5/25  Significant Hospital Events   6/2 LAD perforation during DES placement>Vfib arrest (14 min)>emergent repair>unable to wean bypass>PCCM consulted for ECMO 6/7 decannulation of VA ECMO and placement of impella 5.5 LVAD with sternal closure 6/7 Decannulated from ECMO  Consults:  PCCM Cardiology CT surgery  Procedures:  6/2 PCI 6/2 LAD repair 6/2 ECMO  ETT 6/2>> Central line 6/2>>  Significant Diagnostic Tests:  5/25 echocardiogram>>LVEF 35-40% with regional wall motion abnormalities. Severe inferolateral hypokinesis; G1DD. No valvular disease  6/8 KUB >> feeding tube not able to be passed through pylorus; at level of antral pyloric junction  6/10 Echo >> LVEF 40-45% with mildly decreased function. Global hypokinesis of LV. No LVH. RV function appears normal. No valvular disease  Micro  Data:  n/a  Antimicrobials:  Cefepime 6/2 > 6/8 Vanc 6/2 > Merrem 6/8 >   Interim history/subjective:   Off pressors this morning and off sedation. Per RN, unable to be turned this morning due to tachypnea. CVP 16.  Objective   Blood pressure (!) 157/81, pulse (!) 102, temperature 100.3 F (37.9 C), temperature source Oral, resp. rate (!) 29, height 5' 8.5" (1.74 m), weight 93.6 kg, SpO2 96 %. CVP:  [10 mmHg-22 mmHg] 14 mmHg  Vent Mode: PRVC FiO2 (%):  [40 %] 40 % Set Rate:  [16 bmp] 16 bmp Vt Set:  [550 mL] 550 mL PEEP:  [5 cmH20] 5 cmH20 Plateau Pressure:  [26 cmH20-28 cmH20] 28 cmH20   Intake/Output Summary (Last 24 hours) at 11/02/2019 1022 Last data filed at 11/02/2019 1000 Gross per 24 hour  Intake 16898.25 ml  Output 2957 ml  Net 13941.25 ml   Filed Weights   10/31/19 0558 11/01/19 0600 11/02/19 0600  Weight: 92.3 kg 91.8 kg 93.6 kg    Examination: Gen:      No acute distress HEENT:  EOMI, sclera anicteric Neck:     No masses; no thyromegaly Lungs:    Clear to auscultation bilaterally; normal respiratory effort CV:         Regular rate and rhythm; no murmurs Abd:      + bowel sounds; soft, non-tender; no palpable masses, no distension Ext:    No edema; adequate peripheral perfusion Skin:      Warm and dry; no rash Neuro: Sedated  Resolved Hospital Problem list   Pericardial tamponade--2/2 LAD perforation  Assessment & Plan:  Cardiogenic shock. EF 45% prior to OR. Post Vfib arrest echo suggestive of biventricular failure. Decompensated heart failure s/p thoracotomy for repair of perforation of LAD during PCI. On V-A ECMO since 6/2 and off 6/7 s/p decannulation and now with 5.5 Impella LVAD. - Continue management per cards / TCTS.   - Impella LVAD decreased to P4. Aiming for removal tomorrow in OR  - Milrinone 0.375  - Lasix 80 IV BID - Echo done today  ICM, acute HFrEF,  CAD, h/o HTN.  NSTEMI s/p DES to RCA 5/25.  Cardiogenic shock; likely  distributive shock from sedation contributing. -Continue inotropes and vasopressors as needed to maintain mean arterial pressure than 65. -Continue telemetry monitoring. - Amiodarone infusion - ASA, cangrelor - transition to Plavix when able to absorb via gut  Acute hypoxic respiratory failure requiring mechanical ventilation.  Tobacco use--1ppd smoker. - Continue Low tidal volume ventilation. -Adult ventilator protocol. -Pulmonary hygiene. -Wean FiO2 and PEEP as tolerated - PSV weans as tolerated. Won't plan to extubate today given planned OR procedure tomorrow  High risk alcohol use. Risk for ETOH w/d. -Sedation with fentanyl, Versed. - Thiamine / Folate.  High risk malnutrition. -Trickle tube feeds.  Elevated transaminases likely due to cardiac failure-was improving with slight uptick today (89 / 59). -Continue to monitor.  Absent Bowel Sounds - ? Ileus. - Feeding tube in place - Start TPN - Enema, given no BMs since admit - Continue reglan, neostigmine.  Best practice:  Diet: NPO. Tube feeds now and will start TPN Pain/Anxiety/Delirium protocol (if indicated): dilaudid, precedex, versed VAP protocol (if indicated): yes DVT prophylaxis: heparin gtt GI prophylaxis: pepcid Glucose control: per primary Mobility: BR Code Status: Full Family Communication: Per primary Disposition: ICU  --------------------------------------------------------------------------------------- Attending note: I have seen and examined the patient. History, labs and imaging reviewed.  49 year old with STEMI, perf LAD, tamponade, was on ECMO, decanulated  Weaning off pressors.  Agitated when sedation is lowered  Blood pressure 129/75, pulse 79, temperature 98.1 F (36.7 C), temperature source Oral, resp. rate 11, height 5' 8.5" (1.74 m), weight 93.6 kg, SpO2 97 %. Gen:      No acute distress HEENT:  EOMI, sclera anicteric Neck:     No masses; no thyromegaly, ET tube Lungs:    Clear to  auscultation bilaterally; normal respiratory effort CV:         Regular rate and rhythm; no murmurs Abd:      + bowel sounds; soft, non-tender; no palpable masses, no distension Ext:    No edema; adequate peripheral perfusion Skin:      Warm and dry; no rash Neuro: Somnolent, arousable  Labs/Imaging personally reviewed, significant for Sodium 136, potassium 3.7, BUN/creatinine 12/0.95 AST 89, ALT 59, WBC 14.3 Chest x-ray 6/10-persistent basilar atelectasis.  Assessment/plan: Cardiogenic shock, V. fib arrest, status post ECMO Continues on Impella Plan for removal tomorrow in OR Continue inotropes, Lasix  Acute hypoxic respiratory failure secondary to cardiogenic shock On minimal vent settings.  Will start pressure support weans as tolerated. We will not extubate today as he is going back to the OR tomorrow  The patient is critically ill with multiple organ systems failure and requires high complexity decision making for assessment and support, frequent evaluation and titration of therapies, application of advanced monitoring technologies and extensive interpretation of multiple databases.  Critical care time - 35 mins. This represents my time independent of the NPs time taking care of the pt.  Marshell Garfinkel MD [facility]  11/02/2019, 6:43 PM

## 2019-11-02 NOTE — Progress Notes (Signed)
Patient ID: KINCAID TIGER, male   DOB: 03-Mar-1971, 49 y.o.   MRN: [id number]     Advanced Heart Failure Rounding Note  PCP-Cardiologist: No primary care provider on file.   Subjective:    - LAD repair + VA ECMO cannulation in OR 6/3. Chest wall open. - 6/7: ECMO decannulation, Impella 5.5 placement   This morning, he is off pressors and on milrinone 0.375.  Co-ox 64%, CVP 14-15 with good UOP yesterday on IV Lasix.   Tm 100.7 but currently afebrile, CXR with right effusion. Now on vancomycin/meropenem.   High residuals with trickle tube feeds. Therefore, on Cangrelor.   He will awaken and follow commands.   LDH 662 => 594 => 548 => 514 => 470 => 372 => 356 hgb 8.2 => 8.3 => 8.7 => 8.9 => 9.1 => 9.2 => 8.6 => 9.1 Creatinine 0.99 => 0.81 => 0.83 => 0.79 => 0.95 => 1.15 => 1.06 Plts higher 207  - Echo (6/3): EF < 20%, severe RV dysfunction - Echo (6/6): EF 20-25%, normal RV function - Echo (6/8): EF 25-30%, normal RV  Impella P4 Flow 2 L/min No alarms  Objective:   Weight Range: 93.6 kg Body mass index is 30.92 kg/m.   Vital Signs:   Temp:  [98.6 F (37 C)-100.7 F (38.2 C)] 100.3 F (37.9 C) (06/10 0422) Pulse Rate:  [70-103] 89 (06/10 0743) Resp:  [16-20] 20 (06/10 0743) BP: (80-171)/(58-83) 171/83 (06/10 0743) SpO2:  [92 %-100 %] 97 % (06/10 0743) Arterial Line BP: (90-144)/(51-71) 144/71 (06/10 0600) FiO2 (%):  [40 %] 40 % (06/10 0743) Weight:  [93.6 kg] 93.6 kg (06/10 0600) Last BM Date:  (PTA)  Weight change: Filed Weights   10/31/19 0558 11/01/19 0600 11/02/19 0600  Weight: 92.3 kg 91.8 kg 93.6 kg    Intake/Output:   Intake/Output Summary (Last 24 hours) at 11/02/2019 0804 Last data filed at 11/02/2019 0700 Gross per 24 hour  Intake 16617.56 ml  Output 3047 ml  Net 13570.56 ml      Physical Exam    General: NAD Neck: JVP 12 cm, no thyromegaly or thyroid nodule.  Lungs: Decreased at bases. CV: Nondisplaced PMI.  Heart regular S1/S2, no  S3/S4, no murmur.  No peripheral edema.   Abdomen: Soft, no hepatosplenomegaly, no distention.  Skin: Intact without lesions or rashes.  Neurologic: Will awaken and follow commands.  Extremities: No clubbing or cyanosis.  HEENT: Normal.    Telemetry   NSR 80s (personally reviewed)  Labs    CBC Recent Labs    11/01/19 1600 11/01/19 1600 11/01/19 1748 11/02/19 0513  WBC 12.0*  --   --  15.2*  HGB 7.8*   < > 7.8* 9.1*  HCT 23.9*   < > 23.0* 27.6*  MCV 88.8  --   --  89.0  PLT 177  --   --  207   < > = values in this interval not displayed.   Basic Metabolic Panel Recent Labs    11/01/19 0500 11/01/19 0511 11/01/19 1600 11/01/19 1600 11/01/19 1748 11/02/19 0513  NA 137   < > 137   < > 137 136  K 3.8   < > 3.4*   < > 3.8 3.6  CL 98   < > 104  --   --  101  CO2 24   < > 26  --   --  27  GLUCOSE 128*   < > 122*  --   --  111*  BUN 9   < > 10  --   --  12  CREATININE 1.00   < > 0.99  --   --  1.06  CALCIUM 7.9*   < > 7.5*  --   --  7.7*  MG 2.4  --   --   --   --   --    < > = values in this interval not displayed.   Liver Function Tests Recent Labs    11/01/19 0500 11/02/19 0513  AST 79* 89*  ALT 42 59*  ALKPHOS 73 107  BILITOT 0.8 0.8  PROT 5.6* 5.3*  ALBUMIN 2.2* 2.0*   No results for input(s): LIPASE, AMYLASE in the last 72 hours. Cardiac Enzymes No results for input(s): CKTOTAL, CKMB, CKMBINDEX, TROPONINI in the last 72 hours.  BNP: BNP (last 3 results) Recent Labs    10/17/19 0605  BNP 608.8*    ProBNP (last 3 results) No results for input(s): PROBNP in the last 8760 hours.   D-Dimer No results for input(s): DDIMER in the last 72 hours. Hemoglobin A1C No results for input(s): HGBA1C in the last 72 hours. Fasting Lipid Panel No results for input(s): CHOL, HDL, LDLCALC, TRIG, CHOLHDL, LDLDIRECT in the last 72 hours. Thyroid Function Tests No results for input(s): TSH, T4TOTAL, T3FREE, THYROIDAB in the last 72 hours.  Invalid input(s):  FREET3  Other results:   Imaging    ECHOCARDIOGRAM LIMITED  Result Date: 11/01/2019    ECHOCARDIOGRAM LIMITED REPORT   Patient Name:   ZIYAD DYAR Date of Exam: 11/01/2019 Medical Rec #:  [id number]         Height:       68.5 in Accession #:    [id number]        Weight:       203.5 lb Date of Birth:  Apr 07, 1971         BSA:          2.070 m Patient Age:    48 years          BP:           105/68 mmHg Patient Gender: M                 HR:           77 bpm. Exam Location:  Inpatient Procedure: Limited Echo Indications:    I50.23 Acute on chronic systolic (congestive) heart failure  History:        Patient has prior history of Echocardiogram examinations, most                 recent 10/31/2019. Previous Myocardial Infarction; Risk                 Factors:Current Smoker and Dyslipidemia. Impella. Alcohol abuse.  Sonographer:    Jonelle Sidle Dance Referring Phys: [place]  1. Left ventricular ejection fraction, by estimation, is 30 to 35%. The left ventricle has moderately decreased function. Conclusion(s)/Recommendation(s): Limited echocardiogram for impella position. In many images, there is frequent ventricular ectopy. Final position is 4.8 cm from aortic annulus. Compared to yesterday's echo, impella has been retracted slightly. FINDINGS  Left Ventricle: Left ventricular ejection fraction, by estimation, is 30 to 35%. The left ventricle has moderately decreased function. Abnormal (paradoxical) septal motion consistent with post-operative status. Buford Dresser MD Electronically signed by Buford Dresser MD Signature Date/Time: 11/01/2019/1:51:48 PM    Final      Medications:  Scheduled Medications: . aspirin  81 mg Per Tube Daily  . atorvastatin  40 mg Per Tube Daily  . bisacodyl  10 mg Oral Daily   Or  . bisacodyl  10 mg Rectal Daily  . chlorhexidine gluconate (MEDLINE KIT)  15 mL Mouth Rinse BID  . Chlorhexidine Gluconate Cloth  6 each Topical Daily  .  docusate  200 mg Per Tube Daily  . folic acid  1 mg Intravenous Daily  . furosemide  80 mg Intravenous BID  . insulin aspart  0-9 Units Subcutaneous Q4H  . ketotifen  1 drop Both Eyes BID  . mouth rinse  15 mL Mouth Rinse 10 times per day  . metoCLOPramide (REGLAN) injection  10 mg Intravenous Q8H  . mupirocin ointment   Nasal BID  . neostigmine  0.25 mg Subcutaneous Q6H  . pantoprazole (PROTONIX) IV  40 mg Intravenous QHS  . sodium chloride flush  10-40 mL Intracatheter Q12H  . sodium chloride flush  3 mL Intravenous Q12H  . thiamine injection  100 mg Intravenous Daily    Infusions: . sodium chloride    . sodium chloride Stopped (10/28/19 0227)  . sodium chloride    . sodium chloride 10 mL/hr at 11/01/19 2308  . sodium chloride 10 mL/hr at 11/01/19 1900  . amiodarone 30 mg/hr (11/01/19 2304)  . cangrelor 50 mg in NS 250 mL 0.75 mcg/kg/min (11/02/19 0537)  . dexmedetomidine (PRECEDEX) IV infusion 0.7 mcg/kg/hr (11/02/19 0517)  . epinephrine Stopped (11/01/19 0839)  . feeding supplement (VITAL 1.5 CAL) 15 mL/hr at 11/02/19 0610  . fentaNYL infusion INTRAVENOUS Stopped (10/30/19 1244)  . impella catheter heparin 50 unit/mL in dextrose 5%    . HYDROmorphone 2 mg/hr (11/02/19 0626)  . lactated ringers Stopped (10/28/19 0624)  . lactated ringers    . meropenem (MERREM) IV 1 g (11/02/19 0601)  . midazolam 5 mg/hr (11/02/19 0312)  . milrinone 0.375 mcg/kg/min (11/02/19 0517)  . norepinephrine (LEVOPHED) Adult infusion 2 mcg/min (11/02/19 0300)  . potassium chloride    . vancomycin 1,000 mg (11/02/19 0637)    PRN Medications: sodium chloride, sodium chloride, HYDROmorphone, midazolam, ondansetron (ZOFRAN) IV, ondansetron (ZOFRAN) IV, sodium chloride flush, sodium chloride flush   Assessment/Plan   1. Cardiogenic shock: Pre-OR echo with EF 45%, apical hypokinesis.  TEE in OR after VF arrest showed severe biventricular dysfunction. Placed on ECMO. Repeat echo 6/6 with EF 20-25%, RV  function improved/near normal. Decannulated to Impella 5.5 on 6/7.  This morning, co-ox 64%.  He remains on milrinone 0.375, off pressors. MAP stable, CVP 14-15. LDH and creatinine stable.  - Decrease Impella to P4 this morning, heparin only in purge while on IV Cangrelor. Check position under echo.  Aim for removal of Impella tomorrow in OR.  - Continue current milrinone, off pressors.   - Lasix 80 mg IV bid today.  2. Pericardial tamponade: Due to LAD perforation.  Now resolved in OR.  3. CAD: S/p DES to distal RCA on 5/25.  Has known CTO OM.  Had attempted intervention on CTO LAD today that was complicated by LAD perforation, now s/p LAD repair.  The LAD was not graftable, LAD territory therefore is not perfused except by pre-existing collaterals. Chest remains open. - Continue ASA 81/Cangrelor given recent RCA stent, transition back to Plavix when we think his gut is absorbing.  - Continue statin, atorvastatin 40 daily.   4. ETOH abuse: Watch for withdrawal.   5. Smoking: Will need to quit.  6. Acute hypoxemic respiratory failure: Per CCM.   7. Neuro: Will wake up and follow commands.  8. GI: Suspect ileus.  No bowel sounds.  Concerned he is not absorbing meds.  - Currently getting trickle feeds, residuals remain high.  - IV Reglan and neostigmine.  9. VF arrest: In OR.  Occasional PVCs with increase in Impella speed.  - Off lidocaine.  10. ID: Tm 100.7, CXR with right effusion.  - Continue vancomycin/meropenem.  - No culture data yet.   CRITICAL CARE Performed by: Loralie Champagne  Total critical care time: 40 minutes  Critical care time was exclusive of separately billable procedures and treating other patients.  Critical care was necessary to treat or prevent imminent or life-threatening deterioration.  Critical care was time spent personally by me on the following activities: development of treatment plan with patient and/or surrogate as well as nursing, discussions with  consultants, evaluation of patient's response to treatment, examination of patient, obtaining history from patient or surrogate, ordering and performing treatments and interventions, ordering and review of laboratory studies, ordering and review of radiographic studies, pulse oximetry and re-evaluation of patient's condition.   Length of Stay: 8  Loralie Champagne, MD  11/02/2019, 8:04 AM  Advanced Heart Failure Team Pager [phone] (M-F; 7a - 4p)  Please contact [facility] for night-coverage after hours (4p -7a ) and weekends on [email or web address]

## 2019-11-02 NOTE — Progress Notes (Signed)
Nutrition Follow-up  DOCUMENTATION CODES:   Not applicable  INTERVENTION:   Recommend initiation of TPN; pt with inadequate nutrition x 8 days, unable to advance TF to goal. Discussed with team, plan to start TPN today  Continue Trickle TF of Vital 1.5 as tolerated No BM, plan tor enema today. Continue bowel regimen, reglan  NUTRITION DIAGNOSIS:   Inadequate oral intake related to acute illness as evidenced by NPO status.  Continues   GOAL:   Patient will meet greater than or equal to 90% of their needs  Not Met  MONITOR:   Vent status, TF tolerance, Labs, Weight trends  REASON FOR ASSESSMENT:   Consult, Ventilator Enteral/tube feeding initiation and management  ASSESSMENT:   49 yo male admitted to ICU on 6/2 following CTO PCI to LAD and developed perforated LAD followed by Vfib arrest requiring emergent repair and VA ECMO cannulation, transferred to ICU on vent support. PMH includes CAD, EtOH abuse, HTN, ICM, CHF  6/02 LAD perforation during DES placement followed by Vfib arrest, requiring emergent repair and VA ECMO cannulation 6/07 ECMO decannulation, Impella 5.5 placement 6/08 10 fr tube placed by Radiology, gastric, unable to pass into small intestine  Pt remains on vent support, OG and Cortrak in place  TF tolerance remains a concern. Vital 1.5 at 15 ml/hr. Residuals are being checked given abdominal distention. Residuals have been between 350 and 600 mL Pt remains constipated. No BM since admission; pt on scheduled dulcolax and colace since 6/03. Pt receiving scheduled Reglan for motility. Recommend further intervention regarding bowel regimen. Plan for enema today per MD.   Labs: CBGs 071-219 Meds: lasix, folic acid, thiamine   Diet Order:   Diet Order    None      EDUCATION NEEDS:   Not appropriate for education at this time  Skin:  Skin Assessment: Skin Integrity Issues: Skin Integrity Issues:: Wound VAC Wound Vac: chest incision  Last BM:   Constipated, no BM since admission  Height:   Ht Readings from Last 1 Encounters:  10/25/19 5' 8.5" (1.74 m)    Weight:   Wt Readings from Last 1 Encounters:  11/02/19 93.6 kg    BMI:  Body mass index is 30.92 kg/m.  Estimated Nutritional Needs:   Kcal:  2100-2500 kcals  Protein:  126-151 g  Fluid:  >/= 2 L  Kerman Passey MS, RDN, LDN, CNSC Registered Dietitian III RD Pager Number and RD On-Call Pager Number Located in [place]

## 2019-11-02 NOTE — Progress Notes (Signed)
Patient ID: William Crawford, male   DOB: 1971-02-08, 49 y.o.   MRN: [id number] TCTS Evening Rounds:  Hemodynamically stable on milrinone 0.375  Impella turned down to P4 today.  CVP 13  Has been weaning on vent most of the day.  Good urine output  BMET    Component Value Date/Time   NA 136 11/02/2019 1555   K 3.7 11/02/2019 1555   CL 100 11/02/2019 1555   CO2 26 11/02/2019 1555   GLUCOSE 120 (H) 11/02/2019 1555   BUN 12 11/02/2019 1555   CREATININE 0.95 11/02/2019 1555   CALCIUM 7.9 (L) 11/02/2019 1555   GFRNONAA >60 11/02/2019 1555   GFRAA >60 11/02/2019 1555   CBC    Component Value Date/Time   WBC 14.3 (H) 11/02/2019 1555   RBC 3.08 (L) 11/02/2019 1555   HGB 9.0 (L) 11/02/2019 1555   HCT 27.4 (L) 11/02/2019 1555   PLT 223 11/02/2019 1555   MCV 89.0 11/02/2019 1555   MCH 29.2 11/02/2019 1555   MCHC 32.8 11/02/2019 1555   RDW 14.8 11/02/2019 1555   LYMPHSABS 3.1 09/29/2015 1711   MONOABS 0.5 09/29/2015 1711   EOSABS 0.2 09/29/2015 1711   BASOSABS 0.0 09/29/2015 1711    Planning on removal of Impella in OR tomorrow per PVT.

## 2019-11-02 NOTE — Progress Notes (Signed)
PHARMACY - TOTAL PARENTERAL NUTRITION CONSULT NOTE   Indication: prolonged ileus; intolerance to enteral nutrition   Patient Measurements: Height: 5' 8.5" (174 cm) Weight: 93.6 kg (206 lb 5.6 oz) IBW/kg (Calculated) : 69.55 TPN AdjBW (KG): 83.9 Body mass index is 30.92 kg/m. Usual Weight: Baseline wt unknown; 83 kg on admission  Assessment: 49 yo male presented on 10/25/2019. Patient underwent PCI to LAD and developed perforated LAD and went into VFib arrest and emergent repair. Patient was started on ECMO on 6/2 and decannulated on 6/7 with placement of Impella. Patient has had inadequate nutrition for 8 days. Cortrak was placed on 6/8 and patient has been unable to advance from trickle feeds with high residuals (350-600 mLs). Concern of SBO - no bowel sounds and contrast did not advance on imaging. Pharmacy consulted to start TPN. Of note, patient also with EF 30-35% and receiving IV diuresis.   Glucose / Insulin: No hx of DM. A1c 5.2. On sSSI q4h  Electrolytes: K 3.6 - 40 mEq KCl and furosemide BID. Phos 4.1 (6/7). Mg 2.9 (6/9). Other lytes wnl.  Renal: Scr 1.06 - at baseline.  LFTs / TGs: AST/ALT 89/59. T bili 0.8. TG 171 (5/26).  Prealbumin / albumin: Albumin 2.0. No prealbumin  Intake / Output; MIVF: no MIVF. Positive 16L and undergoing diuresis. Chest tubes 342m/24 hr output.  GI Imaging: - 6/8 x-ray: soft feeding tube could not be passed through pylorus  Surgeries / Procedures:  Central access: PICC TPN start date: 6/10  Nutritional Goals (per RD recommendations on 6/10): kCal: 2100-2500, Protein: 126-151, Fluid: >2L Goal TPN rate is 80 mL/hr (provides 140 g of protein and 2191 kcals per day)  Current Nutrition:  NPO  Tube Feeding TPN starting 6/10  Plan:  Start TPN at 412mhr at 1800 (this provides 70g of protein and 1095 kcal) Electrolytes in TPN: 5029mL of Na, 60m95m of K, 5mEq67mof Ca, 5mEq/77mf Mg, and 15mmol36mf Phos. Cl:Ac ratio 1:1 Add standard MVI and trace  elements to TPN Continue sensitive SSI q4h and adjust as needed  Monitor TPN labs on Mon/Thurs, check electrolytes tomorrow   Natanel Snavely BCristela FeltD PGY1 Pharmacy Resident [employer]: [phone],11:43 AM

## 2019-11-02 NOTE — Progress Notes (Signed)
  Echocardiogram 2D Echocardiogram has been performed.  William Crawford 11/02/2019, 9:35 AM

## 2019-11-02 NOTE — Significant Event (Signed)
Patient placed on pressure support by Dr. Vaughan Browner, 15/5, FIO2 40 at 11:55am.      William Crawford

## 2019-11-02 NOTE — Progress Notes (Signed)
Gastric residuals were 500 and 350 at midnight and 0600 respectively. Abdomen feels firmer .

## 2019-11-02 NOTE — Significant Event (Addendum)
At 11:00am residuals were 500cc from OG. Re-feed residuals back via small bored feeding tube.   At 1800pm residuals were 375cc from OG. Re-feed residuals back via small bored feeding tube.  Tube feeds turned off at 1700 as patient will be getting TPN this evening.   Tyrone Pautsch

## 2019-11-03 ENCOUNTER — Inpatient Hospital Stay (HOSPITAL_COMMUNITY): Payer: BLUE CROSS/BLUE SHIELD

## 2019-11-03 ENCOUNTER — Inpatient Hospital Stay (HOSPITAL_COMMUNITY): Payer: BLUE CROSS/BLUE SHIELD | Admitting: Certified Registered"

## 2019-11-03 ENCOUNTER — Encounter (HOSPITAL_COMMUNITY)
Admission: RE | Disposition: A | Discharge status: Home / Self Care | Payer: Self-pay | Source: Home / Self Care | Attending: Cardiothoracic Surgery

## 2019-11-03 DIAGNOSIS — I9751 Accidental puncture and laceration of a circulatory system organ or structure during a circulatory system procedure: Secondary | ICD-10-CM

## 2019-11-03 HISTORY — PX: REMOVAL OF IMPELLA LEFT VENTRICULAR ASSIST DEVICE: SHX6556

## 2019-11-03 HISTORY — PX: TEE WITHOUT CARDIOVERSION: SHX5443

## 2019-11-03 LAB — POCT I-STAT 7, (LYTES, BLD GAS, ICA,H+H)
Acid-Base Excess: 11 mmol/L — ABNORMAL HIGH (ref 0.0–2.0)
Acid-Base Excess: 6 mmol/L — ABNORMAL HIGH (ref 0.0–2.0)
Acid-Base Excess: 8 mmol/L — ABNORMAL HIGH (ref 0.0–2.0)
Acid-Base Excess: 7 mmol/L — ABNORMAL HIGH (ref 0.0–2.0)
Bicarbonate: 34.8 mmol/L — ABNORMAL HIGH (ref 20.0–28.0)
Bicarbonate: 32.8 mmol/L — ABNORMAL HIGH (ref 20.0–28.0)
Bicarbonate: 34.3 mmol/L — ABNORMAL HIGH (ref 20.0–28.0)
Bicarbonate: 29.9 mmol/L — ABNORMAL HIGH (ref 20.0–28.0)
Calcium, Ion: 1.12 mmol/L — ABNORMAL LOW (ref 1.15–1.40)
Calcium, Ion: 1.16 mmol/L (ref 1.15–1.40)
Calcium, Ion: 1.14 mmol/L — ABNORMAL LOW (ref 1.15–1.40)
Calcium, Ion: 1.09 mmol/L — ABNORMAL LOW (ref 1.15–1.40)
HCT: 23 % — ABNORMAL LOW (ref 39.0–52.0)
HCT: 29 % — ABNORMAL LOW (ref 39.0–52.0)
HCT: 31 % — ABNORMAL LOW (ref 39.0–52.0)
HCT: 32 % — ABNORMAL LOW (ref 39.0–52.0)
Hemoglobin: 7.8 g/dL — ABNORMAL LOW (ref 13.0–17.0)
Hemoglobin: 9.9 g/dL — ABNORMAL LOW (ref 13.0–17.0)
Hemoglobin: 10.5 g/dL — ABNORMAL LOW (ref 13.0–17.0)
Hemoglobin: 10.9 g/dL — ABNORMAL LOW (ref 13.0–17.0)
O2 Saturation: 95 %
O2 Saturation: 90 %
O2 Saturation: 89 %
O2 Saturation: 93 %
Patient temperature: 100.7
Patient temperature: 37
Patient temperature: 37
Patient temperature: 99.3
Potassium: 4.1 mmol/L (ref 3.5–5.1)
Potassium: 4.1 mmol/L (ref 3.5–5.1)
Potassium: 3.6 mmol/L (ref 3.5–5.1)
Potassium: 3.7 mmol/L (ref 3.5–5.1)
Sodium: 137 mmol/L (ref 135–145)
Sodium: 136 mmol/L (ref 135–145)
Sodium: 136 mmol/L (ref 135–145)
Sodium: 135 mmol/L (ref 135–145)
TCO2: 36 mmol/L — ABNORMAL HIGH (ref 22–32)
TCO2: 34 mmol/L — ABNORMAL HIGH (ref 22–32)
TCO2: 36 mmol/L — ABNORMAL HIGH (ref 22–32)
TCO2: 31 mmol/L (ref 22–32)
pCO2 arterial: 46.9 mmHg (ref 32.0–48.0)
pCO2 arterial: 56.6 mmHg — ABNORMAL HIGH (ref 32.0–48.0)
pCO2 arterial: 53.6 mmHg — ABNORMAL HIGH (ref 32.0–48.0)
pCO2 arterial: 37.5 mmHg (ref 32.0–48.0)
pH, Arterial: 7.482 — ABNORMAL HIGH (ref 7.350–7.450)
pH, Arterial: 7.371 (ref 7.350–7.450)
pH, Arterial: 7.415 (ref 7.350–7.450)
pH, Arterial: 7.512 — ABNORMAL HIGH (ref 7.350–7.450)
pO2, Arterial: 76 mmHg — ABNORMAL LOW (ref 83.0–108.0)
pO2, Arterial: 62 mmHg — ABNORMAL LOW (ref 83.0–108.0)
pO2, Arterial: 58 mmHg — ABNORMAL LOW (ref 83.0–108.0)
pO2, Arterial: 60 mmHg — ABNORMAL LOW (ref 83.0–108.0)

## 2019-11-03 LAB — GLUCOSE, CAPILLARY
Glucose-Capillary: 109 mg/dL — ABNORMAL HIGH (ref 70–99)
Glucose-Capillary: 112 mg/dL — ABNORMAL HIGH (ref 70–99)
Glucose-Capillary: 123 mg/dL — ABNORMAL HIGH (ref 70–99)
Glucose-Capillary: 128 mg/dL — ABNORMAL HIGH (ref 70–99)
Glucose-Capillary: 131 mg/dL — ABNORMAL HIGH (ref 70–99)
Glucose-Capillary: 134 mg/dL — ABNORMAL HIGH (ref 70–99)
Glucose-Capillary: 153 mg/dL — ABNORMAL HIGH (ref 70–99)

## 2019-11-03 LAB — BASIC METABOLIC PANEL
Anion gap: 8 (ref 5–15)
BUN: 13 mg/dL (ref 6–20)
CO2: 29 mmol/L (ref 22–32)
Calcium: 8 mg/dL — ABNORMAL LOW (ref 8.9–10.3)
Chloride: 98 mmol/L (ref 98–111)
Creatinine, Ser: 0.79 mg/dL (ref 0.61–1.24)
GFR calc Af Amer: >60 mL/min (ref >60)
GFR calc non Af Amer: >60 mL/min (ref >60)
Glucose, Bld: 133 mg/dL — ABNORMAL HIGH (ref 70–99)
Potassium: 3.7 mmol/L (ref 3.5–5.1)
Sodium: 135 mmol/L (ref 135–145)

## 2019-11-03 LAB — DIFFERENTIAL
Abs Immature Granulocytes: 0.63 10*3/uL — ABNORMAL HIGH (ref 0.00–0.07)
Basophils Absolute: 0.1 10*3/uL (ref 0.0–0.1)
Basophils Relative: 0 %
Eosinophils Absolute: 0.6 10*3/uL — ABNORMAL HIGH (ref 0.0–0.5)
Eosinophils Relative: 5 %
Immature Granulocytes: 5 %
Lymphocytes Relative: 22 %
Lymphs Abs: 2.6 10*3/uL (ref 0.7–4.0)
Monocytes Absolute: 1 10*3/uL (ref 0.1–1.0)
Monocytes Relative: 9 %
Neutro Abs: 6.9 10*3/uL (ref 1.7–7.7)
Neutrophils Relative %: 59 %

## 2019-11-03 LAB — COMPREHENSIVE METABOLIC PANEL
ALT: 54 U/L — ABNORMAL HIGH (ref 0–44)
AST: 68 U/L — ABNORMAL HIGH (ref 15–41)
Albumin: 1.9 g/dL — ABNORMAL LOW (ref 3.5–5.0)
Alkaline Phosphatase: 96 U/L (ref 38–126)
Anion gap: 10 (ref 5–15)
BUN: 14 mg/dL (ref 6–20)
CO2: 28 mmol/L (ref 22–32)
Calcium: 7.8 mg/dL — ABNORMAL LOW (ref 8.9–10.3)
Chloride: 98 mmol/L (ref 98–111)
Creatinine, Ser: 0.9 mg/dL (ref 0.61–1.24)
GFR calc Af Amer: >60 mL/min (ref >60)
GFR calc non Af Amer: >60 mL/min (ref >60)
Glucose, Bld: 118 mg/dL — ABNORMAL HIGH (ref 70–99)
Potassium: 3.7 mmol/L (ref 3.5–5.1)
Sodium: 136 mmol/L (ref 135–145)
Total Bilirubin: 0.5 mg/dL (ref 0.3–1.2)
Total Protein: 5.2 g/dL — ABNORMAL LOW (ref 6.5–8.1)

## 2019-11-03 LAB — CBC
HCT: 26.2 % — ABNORMAL LOW (ref 39.0–52.0)
HCT: 30.7 % — ABNORMAL LOW (ref 39.0–52.0)
Hemoglobin: 8.5 g/dL — ABNORMAL LOW (ref 13.0–17.0)
Hemoglobin: 10.2 g/dL — ABNORMAL LOW (ref 13.0–17.0)
MCH: 29.1 pg (ref 26.0–34.0)
MCH: 29.2 pg (ref 26.0–34.0)
MCHC: 32.4 g/dL (ref 30.0–36.0)
MCHC: 33.2 g/dL (ref 30.0–36.0)
MCV: 89.7 fL (ref 80.0–100.0)
MCV: 88 fL (ref 80.0–100.0)
Platelets: 245 10*3/uL (ref 150–400)
Platelets: 254 10*3/uL (ref 150–400)
RBC: 2.92 MIL/uL — ABNORMAL LOW (ref 4.22–5.81)
RBC: 3.49 MIL/uL — ABNORMAL LOW (ref 4.22–5.81)
RDW: 14.6 % (ref 11.5–15.5)
RDW: 14.3 % (ref 11.5–15.5)
WBC: 11.8 10*3/uL — ABNORMAL HIGH (ref 4.0–10.5)
WBC: 18.8 10*3/uL — ABNORMAL HIGH (ref 4.0–10.5)
nRBC: 0.3 % — ABNORMAL HIGH (ref 0.0–0.2)
nRBC: 0.1 % (ref 0.0–0.2)

## 2019-11-03 LAB — PREALBUMIN: Prealbumin: 10.2 mg/dL — ABNORMAL LOW (ref 18–38)

## 2019-11-03 LAB — TYPE AND SCREEN
ABO/RH(D): A POS
Antibody Screen: NEGATIVE

## 2019-11-03 LAB — HEPARIN LEVEL (UNFRACTIONATED): Heparin Unfractionated: <0.1 IU/mL — ABNORMAL LOW (ref 0.30–0.70)

## 2019-11-03 LAB — LACTATE DEHYDROGENASE: LDH: 330 U/L — ABNORMAL HIGH (ref 98–192)

## 2019-11-03 LAB — PROTIME-INR
INR: 1.1 (ref 0.8–1.2)
Prothrombin Time: 13.6 seconds (ref 11.4–15.2)

## 2019-11-03 LAB — PHOSPHORUS: Phosphorus: 3.1 mg/dL (ref 2.5–4.6)

## 2019-11-03 LAB — ECHO INTRAOPERATIVE TEE
Height: 68.5 in
Weight: 3283.97 oz

## 2019-11-03 LAB — MAGNESIUM: Magnesium: 2.3 mg/dL (ref 1.7–2.4)

## 2019-11-03 LAB — TRIGLYCERIDES: Triglycerides: 155 mg/dL — ABNORMAL HIGH (ref <150)

## 2019-11-03 SURGERY — REMOVAL, CARDIAC ASSIST DEVICE, IMPELLA
Anesthesia: General

## 2019-11-03 MED ORDER — TRACE MINERALS CU-MN-SE-ZN 300-55-60-3000 MCG/ML IV SOLN
INTRAVENOUS | Status: AC
  Filled 2019-11-03: qty 934.4

## 2019-11-03 MED ORDER — ROCURONIUM BROMIDE 100 MG/10ML IV SOLN
INTRAVENOUS | Status: DC | PRN
  Administered 2019-11-03 (×2): 50 mg via INTRAVENOUS

## 2019-11-03 MED ORDER — METOPROLOL TARTRATE 5 MG/5ML IV SOLN
2.5000 mg | Freq: Four times a day (QID) | INTRAVENOUS | Status: DC | PRN
  Administered 2019-11-03 – 2019-11-06 (×2): 2.5 mg via INTRAVENOUS
  Filled 2019-11-03 (×2): qty 5

## 2019-11-03 MED ORDER — HEPARIN (PORCINE) 25000 UT/250ML-% IV SOLN
1000.0000 [IU]/h | INTRAVENOUS | Status: DC
  Administered 2019-11-03: 500 [IU]/h via INTRAVENOUS
  Administered 2019-11-05: 800 [IU]/h via INTRAVENOUS
  Administered 2019-11-07: 1000 [IU]/h via INTRAVENOUS
  Filled 2019-11-03 (×4): qty 250

## 2019-11-03 MED ORDER — KETOROLAC TROMETHAMINE 30 MG/ML IJ SOLN
30.0000 mg | Freq: Four times a day (QID) | INTRAMUSCULAR | Status: AC
  Administered 2019-11-03 – 2019-11-04 (×4): 30 mg via INTRAVENOUS
  Filled 2019-11-03 (×4): qty 1

## 2019-11-03 MED ORDER — NICARDIPINE HCL IN NACL 40-0.83 MG/200ML-% IV SOLN
3.0000 mg/h | INTRAVENOUS | Status: DC
  Administered 2019-11-03 (×2): 10 mg/h via INTRAVENOUS
  Administered 2019-11-03: 12.5 mg/h via INTRAVENOUS
  Administered 2019-11-04 (×3): 15 mg/h via INTRAVENOUS
  Administered 2019-11-04 – 2019-11-05 (×2): 10 mg/h via INTRAVENOUS
  Administered 2019-11-05: 4 mg/h via INTRAVENOUS
  Administered 2019-11-05 (×2): 10 mg/h via INTRAVENOUS
  Filled 2019-11-03 (×15): qty 200

## 2019-11-03 MED ORDER — LISINOPRIL 5 MG PO TABS
5.0000 mg | ORAL_TABLET | Freq: Every day | ORAL | Status: DC
  Administered 2019-11-03 – 2019-11-04 (×2): 5 mg via ORAL
  Filled 2019-11-03 (×2): qty 1

## 2019-11-03 MED ORDER — MIDAZOLAM HCL 2 MG/2ML IJ SOLN
INTRAMUSCULAR | Status: DC | PRN
Start: 2019-11-03 — End: 2019-11-03
  Administered 2019-11-03: 2 mg via INTRAVENOUS

## 2019-11-03 MED ORDER — HYDROMORPHONE HCL 1 MG/ML IJ SOLN
1.0000 mg | INTRAMUSCULAR | Status: DC | PRN
  Administered 2019-11-03: 1 mg via INTRAVENOUS
  Administered 2019-11-04 – 2019-11-08 (×6): 2 mg via INTRAVENOUS
  Administered 2019-11-09 – 2019-11-10 (×2): 1 mg via INTRAVENOUS
  Filled 2019-11-03 (×2): qty 2
  Filled 2019-11-03 (×3): qty 1
  Filled 2019-11-03 (×4): qty 2

## 2019-11-03 MED ORDER — HYDROMORPHONE HCL 1 MG/ML IJ SOLN
0.5000 mg | INTRAMUSCULAR | Status: DC | PRN
  Administered 2019-11-03: 1 mg via INTRAVENOUS
  Filled 2019-11-03: qty 1

## 2019-11-03 MED ORDER — MIDAZOLAM HCL 2 MG/2ML IJ SOLN
INTRAMUSCULAR | Status: AC
  Filled 2019-11-03: qty 2

## 2019-11-03 MED ORDER — FENTANYL CITRATE (PF) 250 MCG/5ML IJ SOLN
INTRAMUSCULAR | Status: DC | PRN
  Administered 2019-11-03: 150 ug via INTRAVENOUS
  Administered 2019-11-03: 100 ug via INTRAVENOUS

## 2019-11-03 MED ORDER — POTASSIUM CHLORIDE 10 MEQ/50ML IV SOLN
10.0000 meq | INTRAVENOUS | Status: AC
  Administered 2019-11-03: 10 meq via INTRAVENOUS
  Filled 2019-11-03: qty 50

## 2019-11-03 MED ORDER — NICARDIPINE HCL IN NACL 20-0.86 MG/200ML-% IV SOLN
3.0000 mg/h | INTRAVENOUS | Status: DC
  Administered 2019-11-03: 5 mg/h via INTRAVENOUS
  Filled 2019-11-03 (×2): qty 200

## 2019-11-03 MED ORDER — ORAL CARE MOUTH RINSE
15.0000 mL | Freq: Two times a day (BID) | OROMUCOSAL | Status: DC
  Administered 2019-11-03 – 2019-11-07 (×8): 15 mL via OROMUCOSAL

## 2019-11-03 MED ORDER — SORBITOL 70 % SOLN
30.0000 mL | Freq: Once | Status: AC
  Administered 2019-11-03: 30 mL
  Filled 2019-11-03: qty 30

## 2019-11-03 MED ORDER — FENTANYL CITRATE (PF) 250 MCG/5ML IJ SOLN
INTRAMUSCULAR | Status: AC
  Filled 2019-11-03: qty 5

## 2019-11-03 MED ORDER — WARFARIN - PHYSICIAN DOSING INPATIENT: Freq: Every day | Status: DC

## 2019-11-03 MED ORDER — WARFARIN SODIUM 2.5 MG PO TABS
2.5000 mg | ORAL_TABLET | Freq: Once | ORAL | Status: AC
  Administered 2019-11-04: 2.5 mg
  Filled 2019-11-03: qty 1

## 2019-11-03 MED ORDER — HYDRALAZINE HCL 20 MG/ML IJ SOLN
10.0000 mg | INTRAMUSCULAR | Status: DC | PRN
  Administered 2019-11-03 – 2019-11-07 (×5): 10 mg via INTRAVENOUS
  Filled 2019-11-03 (×5): qty 1

## 2019-11-03 MED ORDER — POTASSIUM CHLORIDE 10 MEQ/50ML IV SOLN
10.0000 meq | INTRAVENOUS | Status: AC
  Administered 2019-11-03 (×2): 10 meq via INTRAVENOUS
  Filled 2019-11-03: qty 50

## 2019-11-03 MED ORDER — POTASSIUM CHLORIDE 10 MEQ/50ML IV SOLN
10.0000 meq | INTRAVENOUS | Status: AC
  Administered 2019-11-03 (×3): 10 meq via INTRAVENOUS
  Filled 2019-11-03 (×3): qty 50

## 2019-11-03 MED ORDER — LACTATED RINGERS IV SOLN
INTRAVENOUS | Status: DC | PRN
Start: 2019-11-03 — End: 2019-11-03

## 2019-11-03 MED ORDER — HEPARIN SODIUM (PORCINE) 5000 UNIT/ML IJ SOLN: 5000.0000 [IU] | Freq: Three times a day (TID) | INTRAMUSCULAR | Status: DC

## 2019-11-03 MED ORDER — SODIUM CHLORIDE 0.9 % IR SOLN
Status: DC | PRN
  Administered 2019-11-03: 2000 mL

## 2019-11-03 SURGICAL SUPPLY — 96 items
ADAPTER CARDIO PERF ANTE/RETRO (ADAPTER) IMPLANT
ANTEGRADE CPLG (MISCELLANEOUS) IMPLANT
APL PRP STRL LF DISP 70% ISPRP (MISCELLANEOUS)
BAG DECANTER FOR FLEXI CONT (MISCELLANEOUS) IMPLANT
BLADE CLIPPER SURG (BLADE) IMPLANT
BLADE STERNUM SYSTEM 6 (BLADE) IMPLANT
BLADE SURG 12 STRL SS (BLADE) ×3 IMPLANT
BLADE SURG 15 STRL LF DISP TIS (BLADE) IMPLANT
BLADE SURG 15 STRL SS (BLADE)
CANISTER SUCT 3000ML PPV (MISCELLANEOUS) ×3 IMPLANT
CANNULA ARTERIAL NVNT 3/8 20FR (MISCELLANEOUS) IMPLANT
CANNULA SUMP PERICARDIAL (CANNULA) IMPLANT
CANNULA VENOUS LOW PROF 34X46 (CANNULA) IMPLANT
CATH FOLEY 2WAY SLVR  5CC 14FR (CATHETERS)
CATH FOLEY 2WAY SLVR 5CC 14FR (CATHETERS) IMPLANT
CATH HEART VENT LEFT (CATHETERS) IMPLANT
CATH ROBINSON RED A/P 18FR (CATHETERS) IMPLANT
CATH THORACIC 28FR (CATHETERS) IMPLANT
CATH THORACIC 28FR RT ANG (CATHETERS) IMPLANT
CHLORAPREP W/TINT 26 (MISCELLANEOUS) IMPLANT
CLIP VESOCCLUDE MED 6/CT (CLIP) ×3 IMPLANT
CLIP VESOCCLUDE SM WIDE 6/CT (CLIP) ×3 IMPLANT
CONN ST 1/4X3/8  BEN (MISCELLANEOUS)
CONN ST 1/4X3/8 BEN (MISCELLANEOUS) IMPLANT
DRAIN CHANNEL 28F RND 3/8 FF (WOUND CARE) IMPLANT
DRAIN CHANNEL 32F RND 10.7 FF (WOUND CARE) IMPLANT
DRAPE CHEST BREAST 15X10 FENES (DRAPES) ×3 IMPLANT
DRAPE CV SPLIT W-CLR ANES SCRN (DRAPES) IMPLANT
DRAPE INCISE IOBAN 66X45 STRL (DRAPES) IMPLANT
DRAPE PERI GROIN 82X75IN TIB (DRAPES) IMPLANT
DRAPE SLUSH/WARMER DISC (DRAPES) ×3 IMPLANT
DRSG AQUACEL AG ADV 3.5X14 (GAUZE/BANDAGES/DRESSINGS) IMPLANT
DRSG TEGADERM 4X4.75 (GAUZE/BANDAGES/DRESSINGS) ×3 IMPLANT
ELECT BLADE 4.0 EZ CLEAN MEGAD (MISCELLANEOUS) ×3
ELECT BLADE 6.5 EXT (BLADE) IMPLANT
ELECT CAUTERY BLADE 6.4 (BLADE) ×3 IMPLANT
ELECT REM PT RETURN 9FT ADLT (ELECTROSURGICAL) ×3
ELECTRODE BLDE 4.0 EZ CLN MEGD (MISCELLANEOUS) ×1 IMPLANT
ELECTRODE REM PT RTRN 9FT ADLT (ELECTROSURGICAL) ×1 IMPLANT
FELT TEFLON 6X6 (MISCELLANEOUS) ×3 IMPLANT
GAUZE SPONGE 4X4 12PLY STRL (GAUZE/BANDAGES/DRESSINGS) ×3 IMPLANT
GAUZE XEROFORM 1X8 LF (GAUZE/BANDAGES/DRESSINGS) ×3 IMPLANT
GLOVE BIO SURGEON STRL SZ7.5 (GLOVE) ×6 IMPLANT
GOWN STRL REUS W/ TWL LRG LVL3 (GOWN DISPOSABLE) ×3 IMPLANT
GOWN STRL REUS W/ TWL XL LVL3 (GOWN DISPOSABLE) IMPLANT
GOWN STRL REUS W/TWL LRG LVL3 (GOWN DISPOSABLE) ×9
GOWN STRL REUS W/TWL XL LVL3 (GOWN DISPOSABLE)
HEMOSTAT POWDER SURGIFOAM 1G (HEMOSTASIS) IMPLANT
HEMOSTAT SURGICEL 2X14 (HEMOSTASIS) IMPLANT
INSERT FOGARTY XLG (MISCELLANEOUS) IMPLANT
KIT BASIN OR (CUSTOM PROCEDURE TRAY) ×3 IMPLANT
KIT SUCTION CATH 14FR (SUCTIONS) IMPLANT
KIT TURNOVER KIT B (KITS) ×3 IMPLANT
LEAD PACING MYOCARDI (MISCELLANEOUS) IMPLANT
NS IRRIG 1000ML POUR BTL (IV SOLUTION) ×6 IMPLANT
PACK OPEN HEART (CUSTOM PROCEDURE TRAY) IMPLANT
PAD ARMBOARD 7.5X6 YLW CONV (MISCELLANEOUS) ×6 IMPLANT
PAD DEFIB R2 (MISCELLANEOUS) ×3 IMPLANT
POSITIONER HEAD DONUT 9IN (MISCELLANEOUS) ×3 IMPLANT
PUNCH AORTIC ROTATE 4.5MM 8IN (MISCELLANEOUS) IMPLANT
RELOAD TRI 2.0 30 VAS MED SUL (STAPLE) ×3 IMPLANT
SEALANT SURG COSEAL 8ML (VASCULAR PRODUCTS) IMPLANT
SHEATH AVANTI 11CM 5FR (SHEATH) IMPLANT
SUT ETHIBOND 2 0 SH (SUTURE) ×9
SUT ETHIBOND 2 0 SH 36X2 (SUTURE) ×3 IMPLANT
SUT ETHIBOND NAB MH 2-0 36IN (SUTURE) IMPLANT
SUT ETHILON 3 0 FSL (SUTURE) ×3 IMPLANT
SUT PROLENE 3 0 RB 1 (SUTURE) IMPLANT
SUT PROLENE 3 0 SH DA (SUTURE) IMPLANT
SUT PROLENE 4 0 RB 1 (SUTURE) ×3
SUT PROLENE 4-0 RB1 .5 CRCL 36 (SUTURE) ×1 IMPLANT
SUT PROLENE 5 0 C1 (SUTURE) IMPLANT
SUT PROLENE 6 0 C 1 30 (SUTURE) IMPLANT
SUT SILK  1 MH (SUTURE)
SUT SILK 1 MH (SUTURE) IMPLANT
SUT SILK 1 TIES 10X30 (SUTURE) ×3 IMPLANT
SUT SILK 2 0 SH CR/8 (SUTURE) ×3 IMPLANT
SUT STEEL 6MS V (SUTURE) IMPLANT
SUT STEEL SZ 6 DBL 3X14 BALL (SUTURE) IMPLANT
SUT TEM PAC WIRE 2 0 SH (SUTURE) IMPLANT
SUT VIC AB 1 CTX 36 (SUTURE)
SUT VIC AB 1 CTX36XBRD ANBCTR (SUTURE) IMPLANT
SUT VIC AB 2-0 CTX 27 (SUTURE) IMPLANT
SUT VIC AB 3-0 SH 8-18 (SUTURE) ×3 IMPLANT
SUT VIC AB 3-0 X1 27 (SUTURE) IMPLANT
SYR 50ML LL SCALE MARK (SYRINGE) IMPLANT
SYSTEM SAHARA CHEST DRAIN ATS (WOUND CARE) IMPLANT
TOWEL GREEN STERILE (TOWEL DISPOSABLE) ×3 IMPLANT
TRAY CATH LUMEN 1 20CM STRL (SET/KITS/TRAYS/PACK) IMPLANT
TRAY FOLEY SLVR 16FR TEMP STAT (SET/KITS/TRAYS/PACK) IMPLANT
TUBE CONNECTING 12'X1/4 (SUCTIONS)
TUBE CONNECTING 12X1/4 (SUCTIONS) IMPLANT
UNDERPAD 30X36 HEAVY ABSORB (UNDERPADS AND DIAPERS) IMPLANT
VENT LEFT HEART 12002 (CATHETERS)
WATER STERILE IRR 1000ML POUR (IV SOLUTION) ×6 IMPLANT
YANKAUER SUCT BULB TIP NO VENT (SUCTIONS) IMPLANT

## 2019-11-03 NOTE — Progress Notes (Signed)
      [address]       [place] [address]             [phone]    S/p removal of Impella  Extubated, c/o pain  BP (!) 161/68   Pulse 95   Temp 99.2 F (37.3 C) (Oral)   Resp (!) 23   Ht 5' 8.5" (1.74 m)   Wt 93.1 kg   SpO2 97%   BMI 30.75 kg/m   Intake/Output Summary (Last 24 hours) at 11/03/2019 1728 Last data filed at 11/03/2019 1700 Gross per 24 hour  Intake 4554.54 ml  Output 4382 ml  Net 172.54 ml   BP elevated on max dose of nicardipine Will add lisinopril, PRN lopressor IV, continiue Nicardipine, hydralazine Toradol for 24 hours for pain  Remo Lipps C. Roxan Hockey, MD [facility] [phone]

## 2019-11-03 NOTE — Progress Notes (Signed)
Wasted 85m Dilaudid gtt with KMarcene BrawnRN  Wasted 431mversed gtt with KaMarcene BrawnN

## 2019-11-03 NOTE — Progress Notes (Signed)
   11/03/19 1100  Clinical Encounter Type  Visited With Family  Visit Type Follow-up   Chaplain engaged in follow-up visit with Jeremy's mother outside in the waiting area.  His mother described the progress that he has made and the journey that he has been on.  Mother is hopeful and hoping for a total life change for her son.  Chaplain affirmed Jeremy's progress and offered support.

## 2019-11-03 NOTE — Progress Notes (Addendum)
Patient ID: William Crawford, male   DOB: 1971-04-18, 49 y.o.   MRN: [id number]     Advanced Heart Failure Rounding Note  PCP-Cardiologist: No primary care provider on file.   Subjective:    - LAD repair + [facility] ECMO cannulation in OR 6/3. Chest wall open. - 6/7: ECMO decannulation, Impella 5.5 placement  - 6/11: Impella 5.5 removal.   This morning, he is on NE 4 and milrinone 0.375.  CVP 10 today.  No co-ox yet.   Tm 100.7 but currently afebrile, CXR with right effusion. Now on vancomycin/meropenem.   High residuals with trickle tube feeds, started on TPN. Therefore, on Cangrelor.   He will awaken and follow commands.   LDH 662 => 594 => 548 => 514 => 470 => 372 => 356  hgb 8.2 => 8.3 => 8.7 => 8.9 => 9.1 => 9.2 => 8.6 => 9.1 => 8.5 Creatinine 0.99 => 0.81 => 0.83 => 0.79 => 0.95 => 1.15 => 1.06 => 0.9 Plts higher 207 => 245  - Echo (6/3): EF < 20%, severe RV dysfunction - Echo (6/6): EF 20-25%, normal RV function - Echo (6/8): EF 25-30%, normal RV - Echo (6/10): EF 40%, normal RV  Impella P4 Flow 1.8 L/min No alarms  Objective:   Weight Range: 93.1 kg Body mass index is 30.75 kg/m.   Vital Signs:   Temp:  [98.1 F (36.7 C)-100.7 F (38.2 C)] 98.8 F (37.1 C) (06/11 0558) Pulse Rate:  [57-114] 57 (06/11 0736) Resp:  [0-37] 15 (06/11 0736) BP: (99-157)/(52-81) 99/52 (06/11 0332) SpO2:  [90 %-100 %] 100 % (06/11 0736) Arterial Line BP: (87-208)/(48-82) 136/54 (06/11 0736) FiO2 (%):  [40 %-60 %] 40 % (06/11 0500) Weight:  [93.1 kg] 93.1 kg (06/11 0555) Last BM Date:  (PTA)  Weight change: Filed Weights   11/01/19 0600 11/02/19 0600 11/03/19 0555  Weight: 91.8 kg 93.6 kg 93.1 kg    Intake/Output:   Intake/Output Summary (Last 24 hours) at 11/03/2019 0831 Last data filed at 11/03/2019 0700 Gross per 24 hour  Intake 3858.76 ml  Output 3787 ml  Net 71.76 ml      Physical Exam    General: Sedated on vent Neck: JVP 10 cm, no thyromegaly or thyroid nodule.   Lungs: Decreased at bases. CV: Nondisplaced PMI.  Heart regular S1/S2, no S3/S4, no murmur.  No peripheral edema.   Abdomen: Soft, nontender, no hepatosplenomegaly, no distention.  Skin: Intact without lesions or rashes.  Neurologic: Will awaken/follow commands.  Extremities: No clubbing or cyanosis.  HEENT: Normal.    Telemetry   NSR 80s (personally reviewed)  Labs    CBC Recent Labs    11/02/19 1555 11/03/19 0428  WBC 14.3* 11.8*  NEUTROABS  --  6.9  HGB 9.0* 8.5*  HCT 27.4* 26.2*  MCV 89.0 89.7  PLT 223 734   Basic Metabolic Panel Recent Labs    11/02/19 1555 11/03/19 0428  NA 136 136  K 3.7 3.7  CL 100 98  CO2 26 28  GLUCOSE 120* 118*  BUN 12 14  CREATININE 0.95 0.90  CALCIUM 7.9* 7.8*  MG 2.3 2.3  PHOS  --  3.1   Liver Function Tests Recent Labs    11/02/19 0513 11/03/19 0428  AST 89* 68*  ALT 59* 54*  ALKPHOS 107 96  BILITOT 0.8 0.5  PROT 5.3* 5.2*  ALBUMIN 2.0* 1.9*   No results for input(s): LIPASE, AMYLASE in the last 72 hours. Cardiac Enzymes  No results for input(s): CKTOTAL, CKMB, CKMBINDEX, TROPONINI in the last 72 hours.  BNP: BNP (last 3 results) Recent Labs    10/17/19 0605  BNP 608.8*    ProBNP (last 3 results) No results for input(s): PROBNP in the last 8760 hours.   D-Dimer No results for input(s): DDIMER in the last 72 hours. Hemoglobin A1C No results for input(s): HGBA1C in the last 72 hours. Fasting Lipid Panel Recent Labs    11/03/19 0428  TRIG 155*   Thyroid Function Tests No results for input(s): TSH, T4TOTAL, T3FREE, THYROIDAB in the last 72 hours.  Invalid input(s): FREET3  Other results:   Imaging    ECHOCARDIOGRAM LIMITED  Result Date: 11/02/2019    ECHOCARDIOGRAM LIMITED REPORT   Patient Name:   William Crawford Date of Exam: 11/02/2019 Medical Rec #:  [id number]         Height:       68.5 in Accession #:    [id number]        Weight:       206.3 lb Date of Birth:  08/28/1970         BSA:           2.082 m Patient Age:    22 years          BP:           0/0 mmHg Patient Gender: M                 HR:           104 bpm. Exam Location:  Inpatient Procedure: Limited Echo Indications:    CHF 428.21  History:        Patient has prior history of Echocardiogram examinations, most                 recent 11/01/2019. Impella LVAD device. ETOH abuse. Tobacco Abuse.  Sonographer:    [name] RDCS (AE) Referring Phys: [place]  1. Limited echo without color flow Doppler. Impella catheter noted in the LV across the aortic valve. Systolic function has improved.  2. Right ventricular function appears normal on limited views.  3. Left ventricular ejection fraction, by estimation, is 40 to 45%. The left ventricle has mildly decreased function. The left ventricle demonstrates global hypokinesis. Left ventricular diastolic function could not be evaluated.  4. Right ventricular systolic function is normal. The right ventricular size is normal.  5. The mitral valve is grossly normal.  6. The aortic valve is grossly normal. FINDINGS  Left Ventricle: Left ventricular ejection fraction, by estimation, is 40 to 45%. The left ventricle has mildly decreased function. The left ventricle demonstrates global hypokinesis. The left ventricular internal cavity size was normal in size. There is  no left ventricular hypertrophy. Right Ventricle: The right ventricular size is normal. No increase in right ventricular wall thickness. Right ventricular systolic function is normal. Left Atrium: Left atrial size was normal in size. Right Atrium: Right atrial size was normal in size. Pericardium: There is no evidence of pericardial effusion. Mitral Valve: The mitral valve is grossly normal. Normal mobility of the mitral valve leaflets. Tricuspid Valve: The tricuspid valve is grossly normal. Aortic Valve: The aortic valve is grossly normal. Pulmonic Valve: The pulmonic valve was normal in structure. Pulmonic valve  regurgitation is not visualized. No evidence of pulmonic stenosis. Aorta: The aortic root was not well visualized. IAS/Shunts: No atrial level shunt detected by color flow Doppler.  LEFT  VENTRICLE PLAX 2D LVOT diam:     2.10 cm LVOT Area:     3.46 cm   SHUNTS Systemic Diam: 2.10 cm Skeet Latch MD Electronically signed by Skeet Latch MD Signature Date/Time: 11/02/2019/12:31:14 PM    Final      Medications:     Scheduled Medications: . [MAR Hold] aspirin  81 mg Per Tube Daily  . [MAR Hold] atorvastatin  40 mg Per Tube Daily  . [MAR Hold] bisacodyl  10 mg Oral Daily   Or  . [MAR Hold] bisacodyl  10 mg Rectal Daily  . [MAR Hold] chlorhexidine gluconate (MEDLINE KIT)  15 mL Mouth Rinse BID  . [MAR Hold] Chlorhexidine Gluconate Cloth  6 each Topical Daily  . [MAR Hold] docusate  200 mg Per Tube Daily  . [MAR Hold] folic acid  1 mg Intravenous Daily  . [MAR Hold] furosemide  80 mg Intravenous BID  . [MAR Hold] insulin aspart  0-9 Units Subcutaneous Q4H  . [MAR Hold] ketotifen  1 drop Both Eyes BID  . [MAR Hold] mouth rinse  15 mL Mouth Rinse 10 times per day  . [MAR Hold] metoCLOPramide (REGLAN) injection  10 mg Intravenous Q8H  . [MAR Hold] mupirocin ointment   Nasal BID  . [MAR Hold] neostigmine  0.25 mg Subcutaneous Q6H  . [MAR Hold] pantoprazole (PROTONIX) IV  40 mg Intravenous QHS  . [MAR Hold] sodium chloride flush  10-40 mL Intracatheter Q12H  . [MAR Hold] sodium chloride flush  3 mL Intravenous Q12H  . [MAR Hold] thiamine injection  100 mg Intravenous Daily    Infusions: . sodium chloride    . sodium chloride Stopped (10/28/19 0227)  . sodium chloride    . sodium chloride Stopped (11/02/19 1330)  . [MAR Hold] sodium chloride Stopped (11/02/19 1821)  . amiodarone 30 mg/hr (11/03/19 0735)  . cangrelor 50 mg in NS 250 mL 0.75 mcg/kg/min (11/03/19 0546)  .  ceFAZolin (ANCEF) IV    . dexmedetomidine (PRECEDEX) IV infusion 0.7 mcg/kg/hr (11/03/19 0735)  . [MAR Hold]  epinephrine Stopped (11/01/19 0839)  . feeding supplement (VITAL 1.5 CAL) Stopped (11/02/19 1700)  . fentaNYL infusion INTRAVENOUS Stopped (10/30/19 1244)  . impella catheter heparin 50 unit/mL in dextrose 5%    . HYDROmorphone 4 mg/hr (11/03/19 0735)  . lactated ringers Stopped (10/28/19 0624)  . lactated ringers    . [MAR Hold] meropenem (MERREM) IV 1 g (11/02/19 2210)  . midazolam 5 mg/hr (11/03/19 0735)  . milrinone 0.375 mcg/kg/min (11/03/19 0735)  . [MAR Hold] nitroGLYCERIN Stopped (11/02/19 1105)  . [MAR Hold] norepinephrine (LEVOPHED) Adult infusion Stopped (11/02/19 0735)  . [MAR Hold] potassium chloride 10 mEq (11/03/19 0654)  . potassium chloride    . TPN ADULT (ION) 40 mL/hr at 11/02/19 1958  . [MAR Hold] vancomycin 1,000 mg (11/03/19 0615)    PRN Medications: sodium chloride, [MAR Hold] sodium chloride, [MAR Hold] HYDROmorphone, [MAR Hold] midazolam, [MAR Hold] ondansetron (ZOFRAN) IV, [MAR Hold] ondansetron (ZOFRAN) IV, [MAR Hold] sodium chloride flush, [MAR Hold] sodium chloride flush, sodium chloride irrigation   Assessment/Plan   1. Cardiogenic shock: Pre-OR echo with EF 45%, apical hypokinesis.  TEE in OR after VF arrest showed severe biventricular dysfunction. Placed on ECMO. Repeat echo 6/6 with EF 20-25%, RV function improved/near normal. Decannulated to Impella 5.5 on 6/7.  Pending co-ox this morning.  He remains on milrinone 0.375, NE 4. MAP stable, CVP 10. LDH and creatinine stable.  - Plan to remove Impella 5.5  in OR today.  - Continue current milrinone, wean off norepinephrine.  - Lasix 80 mg IV bid again today.  2. Pericardial tamponade: Due to LAD perforation.  Now resolved in OR.  3. CAD: S/p DES to distal RCA on 5/25.  Has known CTO OM.  Had attempted intervention on CTO LAD today that was complicated by LAD perforation, now s/p LAD repair.  The LAD was not graftable, LAD territory therefore is not perfused except by pre-existing collaterals. Chest remains  open. - Continue ASA 81/Cangrelor given recent RCA stent, transition back to Plavix when we think his gut is absorbing.  - Continue statin, atorvastatin 40 daily.   4. ETOH abuse: Watch for withdrawal.   5. Smoking: Will need to quit.  6. Acute hypoxemic respiratory failure: He will awaken on event, follows commands.  - Hopefully extubate today after he returns from OR.  7. Neuro: Will wake up and follow commands.  8. GI: Suspect ileus.  No bowel sounds.  Concerned he is not absorbing meds.  - Now getting TPN.  - IV Reglan and neostigmine.  9. VF arrest: In OR.  Occasional PVCs with increase in Impella speed.  - Off lidocaine.  10. ID: Tm 100.7, CXR with right effusion.  - Continue vancomycin/meropenem.  - No culture data yet.  11. RA thrombus: Discussed with Dr. Prescott Gum after device removal today, there is some thrombus noted in RA on TEE in OR.  Will resume low dose heparin gtt today (rather than [place] heparin) and eventual warfarin.   CRITICAL CARE Performed by: Loralie Champagne  Total critical care time: 40 minutes  Critical care time was exclusive of separately billable procedures and treating other patients.  Critical care was necessary to treat or prevent imminent or life-threatening deterioration.  Critical care was time spent personally by me on the following activities: development of treatment plan with patient and/or surrogate as well as nursing, discussions with consultants, evaluation of patient's response to treatment, examination of patient, obtaining history from patient or surrogate, ordering and performing treatments and interventions, ordering and review of laboratory studies, ordering and review of radiographic studies, pulse oximetry and re-evaluation of patient's condition.   Length of Stay: 9  Loralie Champagne, MD  11/03/2019, 8:31 AM  Advanced Heart Failure Team Pager [phone] (M-F; 7a - 4p)  Please contact [facility] for night-coverage after hours (4p -7a )  and weekends on [email or web address]

## 2019-11-03 NOTE — Transfer of Care (Signed)
Immediate Anesthesia Transfer of Care Note  Patient: DEMARIE HYNEMAN  Procedure(s) Performed: REMOVAL OF IMPELLA 5.5 LEFT VENTRICULAR ASSIST DEVICE (N/A ) TRANSESOPHAGEAL ECHOCARDIOGRAM (TEE) (N/A )  Patient Location: ICU  Anesthesia Type:General  Level of Consciousness: sedated and Patient remains intubated per anesthesia plan  Airway & Oxygen Therapy: Patient remains intubated per anesthesia plan and Patient placed on Ventilator (see vital sign flow sheet for setting)  Post-op Assessment: Report given to RN and Post -op Vital signs reviewed and stable  Post vital signs: Reviewed and stable  Last Vitals:  Vitals Value Taken Time  BP 125/56 11/03/19 0917  Temp    Pulse 71 11/03/19 0919  Resp 12 11/03/19 0919  SpO2 97 % 11/03/19 0919  Vitals shown include unvalidated device data.  Last Pain:  Vitals:   11/03/19 0558  TempSrc: Oral  PainSc:          Complications: No complications documented.

## 2019-11-03 NOTE — Progress Notes (Signed)
Day of Surgery Procedure(s) (LRB): REMOVAL OF IMPELLA 5.5 LEFT VENTRICULAR ASSIST DEVICE (N/A) TRANSESOPHAGEAL ECHOCARDIOGRAM (TEE) (N/A) Subjective: Patient tolerated removal of direct 5.5 Impella LVAD. Intraoperative TEE shows fairly well preserved LV function with mild apical hypokinesia.  There is also some mild-moderate left atrial mural thrombus. He is being weaned from the ventilator with hope for extubation later today  Objective: Vital signs in last 24 hours: Temp:  [98.1 F (36.7 C)-100.7 F (38.2 C)] 98.5 F (36.9 C) (06/11 0952) Pulse Rate:  [57-114] 81 (06/11 1000) Cardiac Rhythm: Normal sinus rhythm (06/11 0917) Resp:  [0-37] 16 (06/11 1000) BP: (99-153)/(52-96) 153/77 (06/11 1000) SpO2:  [90 %-100 %] 93 % (06/11 1000) Arterial Line BP: (87-221)/(48-111) 115/111 (06/11 1000) FiO2 (%):  [40 %-100 %] 50 % (06/11 0957) Weight:  [93.1 kg] 93.1 kg (06/11 0555)  Hemodynamic parameters for last 24 hours: CVP:  [8 mmHg-14 mmHg] 10 mmHg  Intake/Output from previous day: 06/10 0701 - 06/11 0700 In: 4169.9 [I.V.:2357.1; NG/GT:255; IV Piggyback:1273.6] Out: 0786 [Urine:3397; Emesis/NG output:150; Chest Tube:300] Intake/Output this shift: Total I/O In: 1033.1 [I.V.:357.5; Blood:315; IV Piggyback:360.6] Out: 260 [Urine:150; Chest Tube:110]  Patient alert responsive but somewhat agitated with the ET tube Sternal incision covered with Praveena dry intact Extremities warm with pulses Abdomen nondistended Sounds somewhat coarse but equal  Lab Results: Recent Labs    11/02/19 1555 11/02/19 1555 11/03/19 0428 11/03/19 0432  WBC 14.3*  --  11.8*  --   HGB 9.0*   < > 8.5* 7.8*  HCT 27.4*   < > 26.2* 23.0*  PLT 223  --  245  --    < > = values in this interval not displayed.   BMET:  Recent Labs    11/02/19 1555 11/02/19 1555 11/03/19 0428 11/03/19 0432  NA 136   < > 136 137  K 3.7   < > 3.7 4.1  CL 100  --  98  --   CO2 26  --  28  --   GLUCOSE 120*  --  118*   --   BUN 12  --  14  --   CREATININE 0.95  --  0.90  --   CALCIUM 7.9*  --  7.8*  --    < > = values in this interval not displayed.    PT/INR:  Recent Labs    11/03/19 0428  LABPROT 13.6  INR 1.1   ABG    Component Value Date/Time   PHART 7.482 (H) 11/03/2019 0432   HCO3 34.8 (H) 11/03/2019 0432   TCO2 36 (H) 11/03/2019 0432   ACIDBASEDEF 1.0 10/31/2019 0644   O2SAT 95.0 11/03/2019 0432   CBG (last 3)  Recent Labs    11/02/19 2333 11/03/19 0430 11/03/19 0705  GLUCAP 123* 112* 131*    Assessment/Plan: S/P Procedure(s) (LRB): REMOVAL OF IMPELLA 5.5 LEFT VENTRICULAR ASSIST DEVICE (N/A) TRANSESOPHAGEAL ECHOCARDIOGRAM (TEE) (N/A) Continue with ventilator wean. Continue with IV Cangrelor for recent drug-eluting stent placement Start Coumadin slowly for right atrial thrombus noted on echo today   LOS: 9 days    William Crawford 11/03/2019

## 2019-11-03 NOTE — Progress Notes (Signed)
Pre Procedure note for inpatients:   William Crawford has been scheduled for   today. The various methods of treatment have been discussed with the patient. After consideration of the risks, benefits and treatment options the patient has consented to the planned procedure.     removal of impella 5.5 lvad    The patient has been seen and labs reviewed. There are no changes in the patient's condition to prevent proceeding with the planned procedure today.  Recent labs:  Lab Results  Component Value Date   WBC 11.8 (H) 11/03/2019   HGB 8.5 (L) 11/03/2019   HCT 26.2 (L) 11/03/2019   PLT 245 11/03/2019   GLUCOSE 118 (H) 11/03/2019   CHOL 193 10/18/2019   TRIG 155 (H) 11/03/2019   HDL 37 (L) 10/18/2019   LDLCALC 122 (H) 10/18/2019   ALT 54 (H) 11/03/2019   AST 68 (H) 11/03/2019   NA 136 11/03/2019   K 3.7 11/03/2019   CL 98 11/03/2019   CREATININE 0.90 11/03/2019   BUN 14 11/03/2019   CO2 28 11/03/2019   INR 1.1 11/03/2019   HGBA1C 5.2 10/18/2019    Len Childs, MD 11/03/2019 7:35 AM

## 2019-11-03 NOTE — Procedures (Signed)
Extubation Procedure Note  Patient Details:   Name: [name] DOB: [birth year] MRN: [id number]   Airway Documentation:    Vent end date: 11/03/19 Vent end time: 1025   Evaluation  O2 sats: stable throughout Complications: No apparent complications Patient did tolerate procedure well. Bilateral Breath Sounds: Clear, Diminished   Pt extubated per MD order. Pt was placed on 15L HFNC and is tolerating well at this time. Pt had positive cuff leak and is able to voice his name at this time. Will continue to monitor.  Vilinda Blanks 11/03/2019, 10:25 AM

## 2019-11-03 NOTE — Progress Notes (Signed)
[place] for IV Heparin Indication: ECMO > impella  No Known Allergies  Patient Measurements: Height: 5' 8.5" (174 cm) Weight: 93.1 kg (205 lb 4 oz) IBW/kg (Calculated) : 69.55 Heparin Dosing Weight: 83.9 kg  Vital Signs: Temp: 98.5 F (36.9 C) (06/11 0952) Temp Source: Oral (06/11 0929) BP: 180/85 (06/11 1100) Pulse Rate: 89 (06/11 1115)  Labs: Recent Labs    11/01/19 0500 11/01/19 0511 11/02/19 0513 11/02/19 0514 11/02/19 1555 11/02/19 1555 11/03/19 0428 11/03/19 0432  HGB 8.6*   < > 9.1*   < > 9.0*   < > 8.5* 7.8*  HCT 26.6*   < > 27.6*   < > 27.4*  --  26.2* 23.0*  PLT 190   < > 207  --  223  --  245  --   LABPROT 14.3  --  13.3  --   --   --  13.6  --   INR 1.2  --  1.1  --   --   --  1.1  --   HEPARINUNFRC  --    < > <0.10*  --  <0.10*  --  <0.10*  --   CREATININE 1.00   < > 1.06  --  0.95  --  0.90  --    < > = values in this interval not displayed.    Estimated Creatinine Clearance: 113.4 mL/min (by C-G formula based on SCr of 0.9 mg/dL).   Medical History: Past Medical History:  Diagnosis Date  . Alcohol abuse   . GERD (gastroesophageal reflux disease)   . Tobacco abuse     Assessment: 49 year old male initiated on [place] ECMO> decannulated to impella 6/7.  Impella removed today 6/11.  In OR, found RA thrombus noted on TEE. Hgb 7.8 (receiving 1 PRBCs 6/11), plt 245. LDH 330. No s/sx of bleeding.   Plan to start heparin 500 units/hr (fixed rate) - no titration, will discuss increase with MD on 6/12. Warfarin per MD ordered to start tonight - INR 1.1 today.   Remains on cangrelor 0.75 mcg/kg/min until GI absorption improves, then transition to plavix.   Goal of Therapy:  Wait until able to take plavix prior to adding systemic heparin Monitor platelets by anticoagulation protocol: Yes   Plan:  Continue heparin 500 units/hr (fixed dose, no titration 6/11) - f/u increase on 6/12 AM Continue cangrelor  0.62mg/kg/min Warfarin 2.5 mg tonight per MD Check heparin level and CBC daily  Monitor for s/sx bleeding  KAntonietta Jewel PharmD, BMerrillPharmacist  Phone: [phone]/03/2020 12:08 PM  Please check AMION for all [place] numbers After 10:00 PM, call [phone]

## 2019-11-03 NOTE — Progress Notes (Signed)
*  PRELIMINARY RESULTS* Echocardiogram Echocardiogram Transesophageal has been performed.  Oneal Deputy Bijal Siglin 11/03/2019, 8:40 AM

## 2019-11-03 NOTE — Op Note (Signed)
NAME: William Crawford, William Crawford MEDICAL RECORD [id number] ACCOUNT [id number] DATE OF BIRTH:05-22-1971 FACILITY: [facility] LOCATION: [facility] PHYSICIAN:Florena Kozma VAN TRIGT III, MD  OPERATIVE REPORT  DATE OF PROCEDURE:  11/03/2019  OPERATION:  Removal of Impella 5.5 left ventricular assist device.  SURGEON:  [name], MD  ANESTHESIA:  General.  PREOPERATIVE DIAGNOSIS:  Cardiac dysfunction with recent reversed cardiogenic shock.  POSTOPERATIVE DIAGNOSES:  Cardiac dysfunction with recent reversed cardiogenic shock.  DESCRIPTION OF PROCEDURE:  The patient was brought directly from the ICU, intubated and sedated to the operating room for removal of Impella 5.5 left ventricular assist device inserted directly through a graft and the ascending aorta at the time that his  ECMO decannulation was performed 4 days ago.  He has had a slow wean and the flows on the Impella with maintenance of stable hemodynamics and echo showing only mild LV dysfunction, apical hypokinesia.  Informed consent for the procedure was obtained  from the patient's mother.  The risks of bleeding and cardiac dysfunction were explained.  The patient was placed supine on the operating table and general anesthesia was induced through the endotracheal tube and IV sedation.  The right neck was prepped and draped as well as the proximal part of the Impella catheter.  A proper time-out was  performed.  The heavy ties around the graft to secure the Impella were removed.  The Impella was then pulled out of the ventricle and the speed was reduced to P2 and then shortly was turned off.  The Impella came out through the graft without difficulty and a small  amount of arterial blood was purged to remove any thrombus.  The graft was clamped with a vascular clamp, and the Endo-GIA stapling device with vascular load was used to ligate and divide the graft.  The wound was irrigated.  There was no evidence of  bleeding.  The incision was closed with  subcutaneous Vicryl and interrupted nylon for the skin sutures.  Sterile dressing was applied.  The patient then returned back to the ICU intubated and sedated.  Of note, a TEE finding showed some mild-to-moderate moderate right atrial thrombus, probably related to the right atrial cannula that was present for VA ECMO, but removed 4 days ago.  CN/NUANCE  D:11/03/2019 T:11/03/2019 JOB:[id number]

## 2019-11-03 NOTE — Anesthesia Postprocedure Evaluation (Signed)
Anesthesia Post Note  Patient: William Crawford  Procedure(s) Performed: REMOVAL OF IMPELLA 5.5 LEFT VENTRICULAR ASSIST DEVICE (N/A ) TRANSESOPHAGEAL ECHOCARDIOGRAM (TEE) (N/A )     Patient location during evaluation: SICU Anesthesia Type: General Level of consciousness: sedated and patient remains intubated per anesthesia plan Pain management: pain level controlled Vital Signs Assessment: post-procedure vital signs reviewed and stable Respiratory status: patient remains intubated per anesthesia plan and patient on ventilator - see flowsheet for VS Cardiovascular status: stable Postop Assessment: no apparent nausea or vomiting Anesthetic complications: no   No complications documented.  Last Vitals:  Vitals:   11/03/19 1700 11/03/19 1800  BP: (!) 161/68 (!) 127/108  Pulse:  (!) 38  Resp: (!) 23 (!) 22  Temp:    SpO2: 97% 100%    Last Pain:  Vitals:   11/03/19 1600  TempSrc: Oral  PainSc: Asleep                 Kurk Corniel COKER

## 2019-11-03 NOTE — Progress Notes (Signed)
NAME:  William Crawford, MRN:  [id number], DOB:  Sep 24, 1970, LOS: 9 ADMISSION DATE:  10/25/2019, CONSULTATION DATE:  6/2 REFERRING MD:  [place], CHIEF COMPLAINT:  Heart failure   Brief History   49 yo male admitted to ICU on 10/25/19 following CTO PCI to LAD at which time he unfortunately developed a perforated LAD and went into vfib arrest. He subsequently underwent emergent repair however was unable to be removed from bypass following the procedure. PCCM consulted for ECMO.  Past Medical History  Tobacco use, alcohol use, HFrEF, CAD s/p DES placement 5/25  Significant Hospital Events   6/2 LAD perforation during DES placement>Vfib arrest (14 min)>emergent repair>unable to wean bypass>PCCM consulted for ECMO 6/7 decannulation of VA ECMO and placement of impella 5.5 LVAD with sternal closure 6/7 Decannulated from ECMO 6/11 impella removed  Consults:  PCCM Cardiology CT surgery  Procedures:  6/2 PCI 6/2 LAD repair 6/2 ECMO  ETT 6/2>> 6/11 Central line 6/2>>  Significant Diagnostic Tests:  5/25 echocardiogram>>LVEF 35-40% with regional wall motion abnormalities. Severe inferolateral hypokinesis; G1DD. No valvular disease  6/8 KUB >> feeding tube not able to be passed through pylorus; at level of antral pyloric junction  6/10 Echo >> LVEF 40-45% with mildly decreased function. Global hypokinesis of LV. No LVH. RV function appears normal. No valvular disease  Micro Data:  6/7 body fluid culture 6/7 AFB body fluid 6/7 fungal culture 6/8 resp culture  6/8 blood culture 6/8 urine culture  Antimicrobials:  Cefepime 6/2 > 6/8 Vanc 6/2 > Merrem 6/8 >  Interim history/subjective:  impella removed today  Objective   Blood pressure (!) 194/99, pulse 92, temperature 98.5 F (36.9 C), resp. rate 16, height 5' 8.5" (1.74 m), weight 93.1 kg, SpO2 91 %. CVP:  [8 mmHg-14 mmHg] 10 mmHg  Vent Mode: PSV;CPAP FiO2 (%):  [40 %-100 %] 50 % Set Rate:  [16 bmp] 16 bmp Vt Set:  [550  mL] 550 mL PEEP:  [5 cmH20] 5 cmH20 Pressure Support:  [5 cmH20-15 cmH20] 5 cmH20 Plateau Pressure:  [16 cmH20-24 cmH20] 24 cmH20   Intake/Output Summary (Last 24 hours) at 11/03/2019 1031 Last data filed at 11/03/2019 1000 Gross per 24 hour  Intake 4571.29 ml  Output 3347 ml  Net 1224.29 ml   Filed Weights   11/01/19 0600 11/02/19 0600 11/03/19 0555  Weight: 91.8 kg 93.6 kg 93.1 kg    Examination:  General:  In bed on vent HENT: NCAT ETT in place PULM: CTA B, midline scar/wound vac, vent supported breathing CV: RRR, no mgr GI: BS+, soft, nontender MSK: normal bulk and tone Neuro: awake, following commands    Resolved Hospital Problem list     Assessment & Plan:  NSTEMI> Vfib arrest, cardiogenic shock Perforated LAD during PCI V-A ECMO 6/2 > 6/7, impella removed 6/11 Continue management per cardiology/TCTS Ischemic cardiomyopathy CAD Hypertension at baseline inotropes per cardiology Amiodarone per cardiology ASA, Plavix per cardiology Wound care sternum per TCTS  Acute hypoxemic respiratory failure > improved, passing SBT Severe vent dyssynchony Extubate this morning NPO aspiraiton precatutions  HCAP Mero/vanc for 7 day course  Heavy alcohol use Need for sedation Severe agigation/ventilator dyssynchrony> following commands, improved 6/11 Stop PAD protocol Stop dilaudid infusion Continue precedex as needed for agitaiton Thiatmine/folate  Moderate malnutrition Trickle feeds  Abnormal LFT due to right heart failure> improving Monitor LFT prn  Ilues? Trickle feeds Bowel regimen  Best practice:  Diet: tube feeding Pain/Anxiety/Delirium protocol (if indicated): as above VAP protocol (if  indicated): yes DVT prophylaxis: heparin GI prophylaxis: pantoprazole Glucose control: SSI Mobility: bed rest Code Status: full Family Communication: updated mother bedside Disposition: remain in ICU  Labs   CBC: Recent Labs  Lab 11/01/19 0500  11/01/19 0511 11/01/19 1600 11/01/19 1748 11/02/19 0513 11/02/19 0514 11/02/19 1555 11/03/19 0428 11/03/19 0432  WBC 13.8*  --  12.0*  --  15.2*  --  14.3* 11.8*  --   NEUTROABS  --   --   --   --   --   --   --  6.9  --   HGB 8.6*   < > 7.8*   < > 9.1* 8.8* 9.0* 8.5* 7.8*  HCT 26.6*   < > 23.9*   < > 27.6* 26.0* 27.4* 26.2* 23.0*  MCV 88.4  --  88.8  --  89.0  --  89.0 89.7  --   PLT 190  --  177  --  207  --  223 245  --    < > = values in this interval not displayed.    Basic Metabolic Panel: Recent Labs  Lab 10/27/19 1556 10/27/19 1606 10/28/19 0408 10/28/19 0415 10/29/19 0429 10/29/19 3382 10/29/19 2259 10/30/19 0425 10/30/19 0427 10/30/19 5053 11/01/19 0500 11/01/19 0511 11/01/19 1600 11/01/19 1748 11/02/19 0513 11/02/19 0514 11/02/19 1555 11/03/19 0428 11/03/19 0432  NA 140   < > 141   < > 140  140   < > 140   < > 140   < > 137   < > 137   < > 136 137 136 136 137  K 2.9*   < > 3.8   < > 3.7  3.7   < > 3.6   < > 4.7   < > 3.8   < > 3.4*   < > 3.6 3.8 3.7 3.7 4.1  CL 105   < > 108   < > 104   < > 103  --  105   < > 98  --  104  --  101  --  100 98  --   CO2 25   < > 24   < > 24   < > 23  --  22   < > 24  --  26  --  27  --  26 28  --   GLUCOSE 120*   < > 110*   < > 91   < > 110*  --  96   < > 128*  --  122*  --  111*  --  120* 118*  --   BUN 6   < > 6   < > 8   < > 9  --  10   < > 9  --  10  --  12  --  12 14  --   CREATININE 0.80   < > 0.83   < > 0.79   < > 0.93  --  0.95   < > 1.00  --  0.99  --  1.06  --  0.95 0.90  --   CALCIUM 7.8*   < > 7.8*   < > 8.6*   < > 8.4*  --  8.6*   < > 7.9*  --  7.5*  --  7.7*  --  7.9* 7.8*  --   MG 2.0   < > 2.3   < > 2.1   < > 2.0  --  2.3  --  2.4  --   --   --   --   --  2.3 2.3  --   PHOS 1.1*  --  3.3  --  4.5  --   --   --  4.1  --   --   --   --   --   --   --   --  3.1  --    < > = values in this interval not displayed.   GFR: Estimated Creatinine Clearance: 113.4 mL/min (by C-G formula based on SCr of 0.9  mg/dL). Recent Labs  Lab 10/28/19 0408 10/28/19 1650 10/29/19 0429 10/29/19 1714 10/30/19 0427 10/30/19 1355 10/31/19 0359 10/31/19 1606 11/01/19 1600 11/02/19 0513 11/02/19 1555 11/03/19 0428  WBC 5.8   < > 7.2   < > 7.9   < >  --    < > 12.0* 15.2* 14.3* 11.8*  LATICACIDVEN 0.8  --  1.0  --  0.9  --  0.9  --   --   --   --   --    < > = values in this interval not displayed.    Liver Function Tests: Recent Labs  Lab 10/30/19 0427 10/31/19 0357 11/01/19 0500 11/02/19 0513 11/03/19 0428  AST 33 42* 79* 89* 68*  ALT 40 37 42 59* 54*  ALKPHOS 61 55 73 107 96  BILITOT 1.2 1.2 0.8 0.8 0.5  PROT 5.4* 5.4* 5.6* 5.3* 5.2*  ALBUMIN 2.3* 2.4* 2.2* 2.0* 1.9*   No results for input(s): LIPASE, AMYLASE in the last 168 hours. No results for input(s): AMMONIA in the last 168 hours.  ABG    Component Value Date/Time   PHART 7.482 (H) 11/03/2019 0432   PCO2ART 46.9 11/03/2019 0432   PO2ART 76 (L) 11/03/2019 0432   HCO3 34.8 (H) 11/03/2019 0432   TCO2 36 (H) 11/03/2019 0432   ACIDBASEDEF 1.0 10/31/2019 0644   O2SAT 95.0 11/03/2019 0432     Coagulation Profile: Recent Labs  Lab 10/30/19 0427 10/31/19 0357 11/01/19 0500 11/02/19 0513 11/03/19 0428  INR 1.2 1.1 1.2 1.1 1.1    Cardiac Enzymes: No results for input(s): CKTOTAL, CKMB, CKMBINDEX, TROPONINI in the last 168 hours.  HbA1C: Hgb A1c MFr Bld  Date/Time Value Ref Range Status  10/18/2019 05:32 AM 5.2 4.8 - 5.6 % Final    Comment:    (NOTE) Pre diabetes:          5.7%-6.4% Diabetes:              >6.4% Glycemic control for   <7.0% adults with diabetes     CBG: Recent Labs  Lab 11/02/19 1553 11/02/19 1952 11/02/19 2333 11/03/19 0430 11/03/19 0705  GLUCAP 106* 109* 123* 112* 131*     Critical care time: 35 minutes      Roselie Awkward, MD [place] [facility] Pager: [phone] Cell: [phone] If no response, call [phone]

## 2019-11-03 NOTE — Brief Op Note (Signed)
11/03/2019  8:41 AM  PATIENT:  William Crawford  49 y.o. male  PRE-OPERATIVE DIAGNOSIS:  chronic heart failure  POST-OPERATIVE DIAGNOSIS:  chronic heart failure  PROCEDURE:  Procedure(s): REMOVAL OF IMPELLA 5.5 LEFT VENTRICULAR ASSIST DEVICE (N/A) TRANSESOPHAGEAL ECHOCARDIOGRAM (TEE) (N/A)  SURGEON:  Surgeon(s) and Role:2    * [name], MD - Primary2 PHYSICIAN ASSISTANT: none  ASSISTANTS: none   ANESTHESIA:   general  EBL:  50 cc  BLOOD ADMINISTERED:1 unit PRBC CC PRBC  DRAINS: none   LOCAL MEDICATIONS USED:  NONE  SPECIMEN:  No Specimen  DISPOSITION OF SPECIMEN:  N/A  COUNTS:  YES  TOURNIQUET:  * No tourniquets in log *  DICTATION: .Dragon Dictation  PLAN OF CARE: return to 2 H  PATIENT DISPOSITION:  ICU - intubated and hemodynamically stable.   Delay start of Pharmacological VTE agent (>24hrs) due to surgical blood loss or risk of bleeding: yes Start low dose heparin at noon for RA mild-moderate thrombus noted on TEE

## 2019-11-03 NOTE — Anesthesia Preprocedure Evaluation (Signed)
Anesthesia Evaluation  Patient identified by MRN, date of birth, ID band Patient awake    Reviewed: Allergy & Precautions, NPO status , Patient's Chart, lab work & pertinent test results  Airway Mallampati: Intubated       Dental   Pulmonary Current Smoker,    + rhonchi        Cardiovascular  Rhythm:Regular Rate:Normal     Neuro/Psych    GI/Hepatic   Endo/Other    Renal/GU      Musculoskeletal   Abdominal   Peds  Hematology   Anesthesia Other Findings   Reproductive/Obstetrics                             Anesthesia Physical Anesthesia Plan  ASA: III  Anesthesia Plan: General   Post-op Pain Management:    Induction: Intravenous  PONV Risk Score and Plan:   Airway Management Planned: Oral ETT  Additional Equipment: Arterial line  Intra-op Plan:   Post-operative Plan: Post-operative intubation/ventilation  Informed Consent: I have reviewed the patients History and Physical, chart, labs and discussed the procedure including the risks, benefits and alternatives for the proposed anesthesia with the patient or authorized representative who has indicated his/her understanding and acceptance.       Plan Discussed with: CRNA and Anesthesiologist  Anesthesia Plan Comments:         Anesthesia Quick Evaluation

## 2019-11-03 NOTE — Progress Notes (Signed)
VAST RN to pt's bedside to hang TPN via red lumen of RA TL PICC. No blood return obtained, although red lumen flushed easily. Spoke with pt's nurse, Misha who stated none of 3 lumens give blood return and line has already been TPA'd.  VAST RN and PICC RN looked at chest x-ray to troubleshoot tip location and any visual kinks or knots in line. Most recent chest x-ray available from 6/11, am inspected. PICC tip noted to be in appropriate location and no issues noted with line.  Unit RN reported that Impella removed this afternoon. Called unit and spoke with oncoming nurse, Eliezer Lofts regarding PICC without blood return. D/t patient being on multiple drips which cannot be stopped for an extended period at this time, Eliezer Lofts will pass along to day shift tomorrow to check with physician regarding stopping TPN at least 2 hours early on 6/12 and administering TPA via that lumen or inserting a new PICC in left arm 6/12 before new dose of TPN due.  Educated that once TPN is disconnected it cannot be reconnected d/t increased risk of infection. Therefore, regardless of decision regarding TPA vs new PICC placement decision needs to be made and action needs to be carried out before new TPN is hung on 6/12.

## 2019-11-03 NOTE — Progress Notes (Signed)
Wasted 273m dilaudid gtt with pharmacist KMaudie Mercury

## 2019-11-03 NOTE — Progress Notes (Signed)
PHARMACY - TOTAL PARENTERAL NUTRITION CONSULT NOTE   Indication: prolonged ileus; intolerance to enteral nutrition   Patient Measurements: Height: 5' 8.5" (174 cm) Weight: 93.1 kg (205 lb 4 oz) IBW/kg (Calculated) : 69.55 TPN AdjBW (KG): 83.9 Body mass index is 30.75 kg/m. Usual Weight: Baseline wt unknown; 83 kg on admission  Assessment: 49 yo male presented on 10/25/2019. Patient underwent PCI to LAD and developed perforated LAD and went into VFib arrest and emergent repair. Patient was started on ECMO on 6/2 and decannulated on 6/7 with placement of Impella. Patient has had inadequate nutrition for 8 days. Cortrak was placed on 6/8 and patient has been unable to advance from trickle feeds with high residuals (350-600 mLs). Concern of SBO - no bowel sounds and contrast did not advance on imaging. Pharmacy consulted to start TPN. Of note, patient also with EF 30-35%, now improved to EF 40-45% and receiving IV diuresis.   Glucose / Insulin: No hx of DM. A1c 5.2. CBGs at [facility]. On sSSI q4h. 1 units/24 hrs   Electrolytes: K 3.7 - 60 mEq KCl and furosemide BID. Phos 3.1. Mg 2.3. Corrected Ca 9.48. Ionized Ca 1.12. Other lytes wnl.  Renal: Scr 0.9 - at baseline.  LFTs / TGs: AST/ALT 68/54 - decrease. T bili 0.5. TG 155.  Prealbumin / albumin: Albumin 1.9. Prealbumin 10.2. Intake / Output; MIVF: no MIVF. Positive 4L and undergoing diuresis. Chest tubes 235m/24 hr output.  GI Imaging: - 6/8 x-ray: soft feeding tube could not be passed through pylorus  Surgeries / Procedures:  - 6/2: LAD perforation during DES, VFib arrest, emergent repair, ECMO - 6/7: ECMO decannulation, Impella - 6/11: impella removed  Central access: PICC TPN start date: 6/10  Nutritional Goals (per RD recommendations on 6/10): kCal: 2100-2500, Protein: 126-151, Fluid: >2L Goal TPN rate is 80 mL/hr (provides 140 g of protein and 2191 kcals per day)  Current Nutrition:  NPO  Tube Feeding - stopped at 1700 on 6/10 TPN  starting 6/10  Plan:  Increase TPN to goal rate of 870mhr at 1800 Adjust electrolytes in TPN: increase phos to 20 mmol/L, Cl:Ac ratio max chloride (1.14:1) Continued electrolytes in TPN: 5043mL of Na, 71m56m of K, 5mEq65mof Ca, 5mEq/56mf Mg. Add standard MVI and trace elements to TPN Continue sensitive SSI q4h and adjust as needed  Monitor TPN labs on Mon/Thurs, check electrolytes tomorrow   Lavontay Kirk Cristela FeltmD PGY1 Pharmacy Resident [employer]: [phone]/2021,6:50 AM

## 2019-11-04 ENCOUNTER — Inpatient Hospital Stay (HOSPITAL_COMMUNITY): Payer: BLUE CROSS/BLUE SHIELD

## 2019-11-04 LAB — BASIC METABOLIC PANEL
Anion gap: 11 (ref 5–15)
BUN: 14 mg/dL (ref 6–20)
CO2: 24 mmol/L (ref 22–32)
Calcium: 7.7 mg/dL — ABNORMAL LOW (ref 8.9–10.3)
Chloride: 100 mmol/L (ref 98–111)
Creatinine, Ser: 0.76 mg/dL (ref 0.61–1.24)
GFR calc Af Amer: >60 mL/min (ref >60)
GFR calc non Af Amer: >60 mL/min (ref >60)
Glucose, Bld: 120 mg/dL — ABNORMAL HIGH (ref 70–99)
Potassium: 3.1 mmol/L — ABNORMAL LOW (ref 3.5–5.1)
Sodium: 135 mmol/L (ref 135–145)

## 2019-11-04 LAB — POCT I-STAT 7, (LYTES, BLD GAS, ICA,H+H)
Acid-Base Excess: 5 mmol/L — ABNORMAL HIGH (ref 0.0–2.0)
Bicarbonate: 27.7 mmol/L (ref 20.0–28.0)
Calcium, Ion: 1.08 mmol/L — ABNORMAL LOW (ref 1.15–1.40)
HCT: 27 % — ABNORMAL LOW (ref 39.0–52.0)
Hemoglobin: 9.2 g/dL — ABNORMAL LOW (ref 13.0–17.0)
O2 Saturation: 94 %
Patient temperature: 98.3
Potassium: 3.6 mmol/L (ref 3.5–5.1)
Sodium: 135 mmol/L (ref 135–145)
TCO2: 29 mmol/L (ref 22–32)
pCO2 arterial: 32 mmHg (ref 32.0–48.0)
pH, Arterial: 7.545 — ABNORMAL HIGH (ref 7.350–7.450)
pO2, Arterial: 62 mmHg — ABNORMAL LOW (ref 83.0–108.0)

## 2019-11-04 LAB — TYPE AND SCREEN
ABO/RH(D): A POS
Antibody Screen: NEGATIVE
Unit division: 0
Unit division: 0
Unit division: 0
Unit division: 0
Unit division: 0

## 2019-11-04 LAB — AEROBIC/ANAEROBIC CULTURE W GRAM STAIN (SURGICAL/DEEP WOUND)
Culture: NO GROWTH
Gram Stain: NONE SEEN

## 2019-11-04 LAB — COMPREHENSIVE METABOLIC PANEL
ALT: 108 U/L — ABNORMAL HIGH (ref 0–44)
AST: 121 U/L — ABNORMAL HIGH (ref 15–41)
Albumin: 2.3 g/dL — ABNORMAL LOW (ref 3.5–5.0)
Alkaline Phosphatase: 139 U/L — ABNORMAL HIGH (ref 38–126)
Anion gap: 16 — ABNORMAL HIGH (ref 5–15)
BUN: 15 mg/dL (ref 6–20)
CO2: 22 mmol/L (ref 22–32)
Calcium: 7.9 mg/dL — ABNORMAL LOW (ref 8.9–10.3)
Chloride: 98 mmol/L (ref 98–111)
Creatinine, Ser: 0.73 mg/dL (ref 0.61–1.24)
GFR calc Af Amer: >60 mL/min (ref >60)
GFR calc non Af Amer: >60 mL/min (ref >60)
Glucose, Bld: 113 mg/dL — ABNORMAL HIGH (ref 70–99)
Potassium: 3.7 mmol/L (ref 3.5–5.1)
Sodium: 136 mmol/L (ref 135–145)
Total Bilirubin: 0.7 mg/dL (ref 0.3–1.2)
Total Protein: 5.7 g/dL — ABNORMAL LOW (ref 6.5–8.1)

## 2019-11-04 LAB — COOXEMETRY PANEL
Carboxyhemoglobin: 1.6 % — ABNORMAL HIGH (ref 0.5–1.5)
Carboxyhemoglobin: 1.7 % — ABNORMAL HIGH (ref 0.5–1.5)
Methemoglobin: 0.9 % (ref 0.0–1.5)
Methemoglobin: 1.2 % (ref 0.0–1.5)
O2 Saturation: 68.3 %
O2 Saturation: 71.6 %
Total hemoglobin: 6.3 g/dL — CL (ref 12.0–16.0)
Total hemoglobin: 10.7 g/dL — ABNORMAL LOW (ref 12.0–16.0)

## 2019-11-04 LAB — CBC
HCT: 29.4 % — ABNORMAL LOW (ref 39.0–52.0)
HCT: 31.5 % — ABNORMAL LOW (ref 39.0–52.0)
Hemoglobin: 9.6 g/dL — ABNORMAL LOW (ref 13.0–17.0)
Hemoglobin: 10.4 g/dL — ABNORMAL LOW (ref 13.0–17.0)
MCH: 28.8 pg (ref 26.0–34.0)
MCH: 29 pg (ref 26.0–34.0)
MCHC: 32.7 g/dL (ref 30.0–36.0)
MCHC: 33 g/dL (ref 30.0–36.0)
MCV: 88.3 fL (ref 80.0–100.0)
MCV: 87.7 fL (ref 80.0–100.0)
Platelets: 277 10*3/uL (ref 150–400)
Platelets: 371 10*3/uL (ref 150–400)
RBC: 3.33 MIL/uL — ABNORMAL LOW (ref 4.22–5.81)
RBC: 3.59 MIL/uL — ABNORMAL LOW (ref 4.22–5.81)
RDW: 14.4 % (ref 11.5–15.5)
RDW: 14.5 % (ref 11.5–15.5)
WBC: 17.1 10*3/uL — ABNORMAL HIGH (ref 4.0–10.5)
WBC: 22 10*3/uL — ABNORMAL HIGH (ref 4.0–10.5)
nRBC: 0 % (ref 0.0–0.2)
nRBC: 0 % (ref 0.0–0.2)

## 2019-11-04 LAB — BPAM RBC
Blood Product Expiration Date: 202106282359
Blood Product Expiration Date: 202106302359
Blood Product Expiration Date: 202106302359
Blood Product Expiration Date: 202106302359
Blood Product Expiration Date: 202106302359
ISSUE DATE / TIME: 202106091752
ISSUE DATE / TIME: 202106110732
ISSUE DATE / TIME: 202106110732
Unit Type and Rh: 6200
Unit Type and Rh: 6200
Unit Type and Rh: 6200
Unit Type and Rh: 6200
Unit Type and Rh: 6200

## 2019-11-04 LAB — LACTATE DEHYDROGENASE: LDH: 483 U/L — ABNORMAL HIGH (ref 98–192)

## 2019-11-04 LAB — GLUCOSE, CAPILLARY
Glucose-Capillary: 135 mg/dL — ABNORMAL HIGH (ref 70–99)
Glucose-Capillary: 135 mg/dL — ABNORMAL HIGH (ref 70–99)
Glucose-Capillary: 113 mg/dL — ABNORMAL HIGH (ref 70–99)
Glucose-Capillary: 111 mg/dL — ABNORMAL HIGH (ref 70–99)
Glucose-Capillary: 135 mg/dL — ABNORMAL HIGH (ref 70–99)
Glucose-Capillary: 115 mg/dL — ABNORMAL HIGH (ref 70–99)

## 2019-11-04 LAB — PROTIME-INR
INR: 1.1 (ref 0.8–1.2)
Prothrombin Time: 13.4 seconds (ref 11.4–15.2)

## 2019-11-04 LAB — MAGNESIUM: Magnesium: 2.3 mg/dL (ref 1.7–2.4)

## 2019-11-04 LAB — PHOSPHORUS: Phosphorus: 2.6 mg/dL (ref 2.5–4.6)

## 2019-11-04 LAB — HEPARIN LEVEL (UNFRACTIONATED): Heparin Unfractionated: <0.1 IU/mL — ABNORMAL LOW (ref 0.30–0.70)

## 2019-11-04 MED ORDER — OXYCODONE HCL 5 MG PO TABS
5.0000 mg | ORAL_TABLET | ORAL | Status: DC | PRN
  Administered 2019-11-04 – 2019-11-06 (×4): 10 mg via ORAL
  Administered 2019-11-06: 5 mg via ORAL
  Filled 2019-11-04 (×3): qty 2
  Filled 2019-11-04: qty 1
  Filled 2019-11-04 (×2): qty 2

## 2019-11-04 MED ORDER — BISACODYL 10 MG RE SUPP: 10.0000 mg | Freq: Every day | RECTAL | Status: DC | PRN

## 2019-11-04 MED ORDER — POTASSIUM CHLORIDE 10 MEQ/50ML IV SOLN
10.0000 meq | INTRAVENOUS | Status: AC
  Administered 2019-11-04 (×3): 10 meq via INTRAVENOUS
  Filled 2019-11-04 (×3): qty 50

## 2019-11-04 MED ORDER — ALTEPLASE 2 MG IJ SOLR
2.0000 mg | Freq: Once | INTRAMUSCULAR | Status: AC
  Administered 2019-11-04: 2 mg
  Filled 2019-11-04: qty 2

## 2019-11-04 MED ORDER — TRACE MINERALS CU-MN-SE-ZN 300-55-60-3000 MCG/ML IV SOLN
INTRAVENOUS | Status: AC
  Filled 2019-11-04: qty 934.4

## 2019-11-04 MED ORDER — BISACODYL 5 MG PO TBEC: 10.0000 mg | DELAYED_RELEASE_TABLET | Freq: Every day | ORAL | Status: DC | PRN

## 2019-11-04 MED ORDER — SACUBITRIL-VALSARTAN 49-51 MG PO TABS
1.0000 | ORAL_TABLET | Freq: Two times a day (BID) | ORAL | Status: DC
  Administered 2019-11-04 – 2019-11-05 (×3): 1 via ORAL
  Filled 2019-11-04 (×4): qty 1

## 2019-11-04 MED ORDER — POTASSIUM CHLORIDE 10 MEQ/50ML IV SOLN
10.0000 meq | INTRAVENOUS | Status: AC
  Administered 2019-11-04 – 2019-11-05 (×3): 10 meq via INTRAVENOUS
  Filled 2019-11-04 (×3): qty 50

## 2019-11-04 NOTE — Evaluation (Signed)
Physical Therapy Evaluation Patient Details Name: William Crawford MRN: [id number] DOB: 04/28/1971 Today's Date: 11/04/2019   History of Present Illness  Pt is 49 yo male who presented with chest pressure, NSTEMI, underwent DES to distal RCA on 5/25. Had attempted intervention on CTO LAD that was complicated by LAD perforation with VF arrest. TEE showed severe biventricular dysfunction. Placed on ECMO, decannulated to impella 6/7, impella removed 6/11.In addition, suspected ileus. PMH: tobacco and EtOH abuse, GERD.   Clinical Impression  Pt admitted with above diagnosis. Pt presents with 2/4 dyspnea with conversation and exertion, SPO2 low 90's with exertion on O2. Min A +2 for sit<>stand and pivot to chair. Pt had difficulty lifting feet due to generalized weakness. Pt followed basic instructional commands but needed frequent cueing for sternal precautions even just a few minutes after reviewing them with him. Anticipate pt needing post acute rehab to return to PLOF. Pt plans to d/c home with his parents.  Pt currently with functional limitations due to the deficits listed below (see PT Problem List). Pt will benefit from skilled PT to increase their independence and safety with mobility to allow discharge to the venue listed below.       Follow Up Recommendations CIR    Equipment Recommendations  Other (comment) (TBD)    Recommendations for Other Services OT consult;Rehab consult     Precautions / Restrictions Precautions Precautions: Sternal Precaution Booklet Issued: No Precaution Comments: reviewed sternal precautions, pt needed frequent reminders and cueing Restrictions Weight Bearing Restrictions: Yes Other Position/Activity Restrictions: R chest tube, wound vac to chest incision      Mobility  Bed Mobility Overal bed mobility: Needs Assistance Bed Mobility: Supine to Sit     Supine to sit: Min assist;+2 for safety/equipment     General bed mobility comments: min A for  mgmt of lines and min HHA to come to sitting and ensure no pushing or pulling with UE"s  Transfers Overall transfer level: Needs assistance Equipment used: 2 person hand held assist Transfers: Sit to/from [facility] Sit to Stand: Min assist;+2 physical assistance;+2 safety/equipment Stand pivot transfers: Min assist;+2 physical assistance;+2 safety/equipment       General transfer comment: pt had difficulty lifting feet to step, shuffling steps to recliner with decreased wt shifting. Min A +2 for support. LE weakness noted Bilaterally  Ambulation/Gait             General Gait Details: not progressed yet  Financial trader Rankin (Stroke Patients Only)       Balance Overall balance assessment: Needs assistance Sitting-balance support: No upper extremity supported;Feet supported Sitting balance-Leahy Scale: Fair     Standing balance support: Bilateral upper extremity supported;During functional activity Standing balance-Leahy Scale: Poor                               Pertinent Vitals/Pain Pain Assessment: Faces Faces Pain Scale: Hurts even more Pain Location: chest, point of urinary catheter insertion Pain Descriptors / Indicators: Sore Pain Intervention(s): Premedicated before session;Monitored during session    Home Living Family/patient expects to be discharged to:: Private residence Living Arrangements: Parent Available Help at Discharge: Family;Available 24 hours/day Type of Home: House Home Access: Stairs to enter   [employer] of Steps: 3 Home Layout: One level Home Equipment: None Additional Comments: pt was not living with his parents  PTA but reports that he will go to their house at d/c    Prior Function Level of Independence: Independent         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[employer]   Dominant Hand: Right    Extremity/Trunk Assessment   Upper Extremity  Assessment Upper Extremity Assessment: Generalized weakness    Lower Extremity Assessment Lower Extremity Assessment: Generalized weakness    Cervical / Trunk Assessment Cervical / Trunk Assessment: Normal  Communication   Communication: Other (comment) (dyspniec)  Cognition Arousal/Alertness: Awake/alert Behavior During Therapy: Anxious Overall Cognitive Status: No family/caregiver present to determine baseline cognitive functioning                                 General Comments: pt follows basic commands with mildly increased time, not remembering precautions within minutes of reviewing them.       General Comments General comments (skin integrity, edema, etc.): SpO2 low 90's on 2L, HR stable    Exercises General Exercises - Lower Extremity Ankle Circles/Pumps: AROM;Both;15 reps;Seated Quad Sets: AROM;Both;15 reps;Seated Gluteal Sets: AROM;Both;15 reps;Seated   Assessment/Plan    PT Assessment Patient needs continued PT services  PT Problem List Decreased strength;Decreased activity tolerance;Decreased balance;Decreased mobility;Decreased knowledge of use of DME;Decreased safety awareness;Decreased knowledge of precautions;Pain;Cardiopulmonary status limiting activity       PT Treatment Interventions DME instruction;Gait training;Stair training;Functional mobility training;Therapeutic activities;Therapeutic exercise;Balance training;Neuromuscular re-education;Cognitive remediation;Patient/family education    PT Goals (Current goals can be found in the Care Plan section)  Acute Rehab PT Goals Patient Stated Goal: get better PT Goal Formulation: With patient Time For Goal Achievement: 11/18/19 Potential to Achieve Goals: Good    Frequency Min 3X/week   Barriers to discharge        Co-evaluation               AM-PAC PT "6 Clicks" Mobility  Outcome Measure Help needed turning from your back to your side while in a flat bed without using  bedrails?: A Lot Help needed moving from lying on your back to sitting on the side of a flat bed without using bedrails?: A Lot Help needed moving to and from a bed to a chair (including a wheelchair)?: A Lot Help needed standing up from a chair using your arms (e.g., wheelchair or bedside chair)?: A Lot Help needed to walk in hospital room?: Total Help needed climbing 3-5 steps with a railing? : Total 6 Click Score: 10    End of Session Equipment Utilized During Treatment: Oxygen Activity Tolerance: Patient tolerated treatment well Patient left: in chair;with call bell/phone within reach Nurse Communication: Mobility status PT Visit Diagnosis: Unsteadiness on feet (R26.81);Muscle weakness (generalized) (M62.81);Pain;Difficulty in walking, not elsewhere classified (R26.2) Pain - part of body:  (chest)    Time: 0354-6568 PT Time Calculation (min) (ACUTE ONLY): 23 min   Charges:   PT Evaluation $PT Eval Moderate Complexity: 1 Mod PT Treatments $Therapeutic Activity: 8-22 mins        Leighton Roach, [place]  Pager [phone] [place] 11/04/2019, 4:16 PM

## 2019-11-04 NOTE — Progress Notes (Signed)
PCCM:  Patient extubated yesterday, patient awake and alert. Remainder of ICU management per TCTS/Cardiology. PCCM will sign off at this time. Please do not hesitate to call for any questions or concerns.   Garner Nash, DO [place] [facility] 11/04/2019 10:05 AM

## 2019-11-04 NOTE — Progress Notes (Signed)
PHARMACY - TOTAL PARENTERAL NUTRITION CONSULT NOTE   Indication: prolonged ileus; intolerance to enteral nutrition   Patient Measurements: Height: 5' 8.5" (174 cm) Weight: 93.1 kg (205 lb 4 oz) IBW/kg (Calculated) : 69.55 TPN AdjBW (KG): 83.9 Body mass index is 30.75 kg/m. Usual Weight: Baseline wt unknown; 83 kg on admission  Assessment: 49 yo male presented on 10/25/2019. Patient underwent PCI to LAD and developed perforated LAD and went into VFib arrest and emergent repair. Patient was started on ECMO on 6/2 and decannulated on 6/7 with placement of Impella. Patient has had inadequate nutrition for 8 days. Cortrak was placed on 6/8 and patient has been unable to advance from trickle feeds with high residuals (350-600 mLs). Concern of SBO - no bowel sounds and contrast did not advance on imaging. Pharmacy consulted to start TPN. Of note, EF 30-35%, now improved to 40-45% and receiving IV diuresis. S/p removal of Impella 6/11 and extubated.  Glucose / Insulin: No hx of DM. A1c 5.2. CBGs controlled. On sSSI q4h - utilized 4 units/24 hrs   Electrolytes: K 3.7 stable (s/p K runs x 6; on Lasix 80 IV BID). Phos 2.6 - wnl but low end of range (despite increase in the TPN). Ionized Ca 1.08. Others wnl.  Renal: Scr stable wnl - at baseline LFTs / TGs: AST/ALT 121/108 - increased. Tbili WNL. TG 155  Prealbumin / albumin: Albumin 2.3. Prealbumin 10.2. Intake / Output; MIVF: no MIVF. Positive 4L and undergoing diuresis. UOP 1.8 ml/kg/hr. Chest tubes 319m/24 hr output, NGT 1538m24 hr output. LBM 6/11 (now frequent BM after sorbitol/enema) GI Imaging: - 6/8 x-ray: soft feeding tube could not be passed through pylorus  Surgeries / Procedures:  - 6/2: LAD perforation during DES, VFib arrest, emergent repair, ECMO - 6/7: ECMO decannulation, Impella - 6/11: impella removed, extubated Central access: PICC TPN start date: 6/10  Nutritional Goals (per RD recommendations on 6/10): kCal: 2100-2500,  Protein: 126-151, Fluid: >2L Goal TPN rate is 80 mL/hr (provides 140 g of protein and 2191 kcals per day)  Current Nutrition:  Trial CLD 6/12 Tube Feeding - stopped at 1700 on 6/10 TPN starting 6/10  Plan:  Continue TPN at goal rate 8060mr at 1800 Adjust electrolytes in TPN: increase phos to 25 mmol/L Continued electrolytes in TPN: 19m14m of Na, 19mE70mof K, 5mEq/105mf Ca, 5mEq/L43m Mg, Cl:Ac ratio: max chloride Add standard MVI and trace elements to TPN Continue sensitive SSI q4h and adjust as needed  Monitor TPN labs  K runs x 3 already ordered per CVTS   Bera Pinela vArturo MortonD, BCPS Please check AMION for all [facility] numbers Clinical Pharmacist 11/04/2019 7:56 AM

## 2019-11-04 NOTE — Progress Notes (Signed)
Patient ID: William Crawford, male   DOB: [birth year], 49 y.o.   MRN: [id number]     Advanced Heart Failure Rounding Note  PCP-Cardiologist: No primary care provider on file.   Subjective:    - LAD repair + VA ECMO cannulation in OR 6/3. Chest wall open. - 6/7: ECMO decannulation, Impella 5.5 placement  - 6/11: Impella 5.5 removal.   Impella removed yesterday in OR. Now extubated.   On NE 4. Milrinone 0.375. Also on cangrelor and nicardipine. No co-ox drawn (PICC clotted). Renal function stable. Hgb 9.6  CVP 15  On TPN with frequent BMS.   Mildly SOB. No CP    - Echo (6/3): EF < 20%, severe RV dysfunction - Echo (6/6): EF 20-25%, normal RV function - Echo (6/8): EF 25-30%, normal RV - Echo (6/10): EF 40%, normal RV    Objective:   Weight Range: 93.1 kg Body mass index is 30.75 kg/m.   Vital Signs:   Temp:  [97.5 F (36.4 C)-99.2 F (37.3 C)] 97.5 F (36.4 C) (06/12 0732) Pulse Rate:  [38-123] 97 (06/12 1100) Resp:  [11-37] 36 (06/12 1100) BP: (123-183)/(55-108) 148/60 (06/12 1100) SpO2:  [90 %-100 %] 93 % (06/12 1100) Arterial Line BP: (111-206)/(50-128) 111/73 (06/12 0330) Last BM Date: 11/04/19  Weight change: Filed Weights   11/01/19 0600 11/02/19 0600 11/03/19 0555  Weight: 91.8 kg 93.6 kg 93.1 kg    Intake/Output:   Intake/Output Summary (Last 24 hours) at 11/04/2019 1115 Last data filed at 11/04/2019 1000 Gross per 24 hour  Intake 4588.01 ml  Output 4180 ml  Net 408.01 ml      Physical Exam    General:  Sitting up in bed. Dyspneic talking HEENT: normal Neck: supple.JVP to jaw  Carotids 2+ bilat; no bruits. No lymphadenopathy or thryomegaly appreciated. Cor: PMI nondisplaced. Regular rate & rhythm. + wound vac No rubs, gallops or murmurs. Lungs: coarse Abdomen: soft, nontender, nondistended. No hepatosplenomegaly. No bruits or masses. Good bowel sounds. Extremities: no cyanosis, clubbing, rash, 1+ edema Neuro: alert & orientedx3, cranial  nerves grossly intact. moves all 4 extremities w/o difficulty. Affect pleasant    Telemetry   NSR 90s (personally reviewed)  Labs    CBC Recent Labs    11/03/19 0428 11/03/19 0432 11/03/19 1556 11/03/19 1619 11/04/19 0501 11/04/19 0529  WBC 11.8*   < > 18.8*  --   --  17.1*  NEUTROABS 6.9  --   --   --   --   --   HGB 8.5*   < > 10.2*   < > 9.2* 9.6*  HCT 26.2*   < > 30.7*   < > 27.0* 29.4*  MCV 89.7   < > 88.0  --   --  88.3  PLT 245   < > 254  --   --  277   < > = values in this interval not displayed.   Basic Metabolic Panel Recent Labs    11/03/19 0428 11/03/19 0432 11/03/19 1556 11/03/19 1619 11/04/19 0501 11/04/19 0531  NA 136   < > 135   < > 135 136  K 3.7   < > 3.7   < > 3.6 3.7  CL 98   < > 98  --   --  98  CO2 28   < > 29  --   --  22  GLUCOSE 118*   < > 133*  --   --  113*  BUN 14   < >  13  --   --  15  CREATININE 0.90   < > 0.79  --   --  0.73  CALCIUM 7.8*   < > 8.0*  --   --  7.9*  MG 2.3  --   --   --   --  2.3  PHOS 3.1  --   --   --   --  2.6   < > = values in this interval not displayed.   Liver Function Tests Recent Labs    11/03/19 0428 11/04/19 0531  AST 68* 121*  ALT 54* 108*  ALKPHOS 96 139*  BILITOT 0.5 0.7  PROT 5.2* 5.7*  ALBUMIN 1.9* 2.3*   No results for input(s): LIPASE, AMYLASE in the last 72 hours. Cardiac Enzymes No results for input(s): CKTOTAL, CKMB, CKMBINDEX, TROPONINI in the last 72 hours.  BNP: BNP (last 3 results) Recent Labs    10/17/19 0605  BNP 608.8*    ProBNP (last 3 results) No results for input(s): PROBNP in the last 8760 hours.   D-Dimer No results for input(s): DDIMER in the last 72 hours. Hemoglobin A1C No results for input(s): HGBA1C in the last 72 hours. Fasting Lipid Panel Recent Labs    11/03/19 0428  TRIG 155*   Thyroid Function Tests No results for input(s): TSH, T4TOTAL, T3FREE, THYROIDAB in the last 72 hours.  Invalid input(s): FREET3  Other results:   Imaging     DG Chest Port 1 View  Result Date: 11/04/2019 CLINICAL DATA:  Status post cardiac surgery, extubated EXAM: PORTABLE CHEST 1 VIEW COMPARISON:  11/03/2019 FINDINGS: Interval extubation. Left chest tube, Impella device, and mediastinal drain all removed. Stable cardiomegaly with some improvement in lung volumes but persistent bibasilar atelectasis, worse on the right. Right chest tube remains. No significant or enlarging pneumothorax. No enlarging effusion. Stable postoperative changes from median sternotomy. IMPRESSION: Improving lung volumes with residual bibasilar atelectasis. Electronically Signed   By: Jerilynn Mages.  Shick M.D.   On: 11/04/2019 10:10     Medications:     Scheduled Medications: . aspirin  81 mg Per Tube Daily  . atorvastatin  40 mg Per Tube Daily  . chlorhexidine gluconate (MEDLINE KIT)  15 mL Mouth Rinse BID  . Chlorhexidine Gluconate Cloth  6 each Topical Daily  . docusate  200 mg Per Tube Daily  . folic acid  1 mg Intravenous Daily  . furosemide  80 mg Intravenous BID  . insulin aspart  0-9 Units Subcutaneous Q4H  . ketotifen  1 drop Both Eyes BID  . lisinopril  5 mg Oral Daily  . mouth rinse  15 mL Mouth Rinse BID  . metoCLOPramide (REGLAN) injection  10 mg Intravenous Q8H  . mupirocin ointment   Nasal BID  . neostigmine  0.25 mg Subcutaneous Q6H  . pantoprazole (PROTONIX) IV  40 mg Intravenous QHS  . sodium chloride flush  10-40 mL Intracatheter Q12H  . sodium chloride flush  3 mL Intravenous Q12H  . thiamine injection  100 mg Intravenous Daily  . warfarin  2.5 mg Per Tube ONCE-1600  . Warfarin - Physician Dosing Inpatient   Does not apply q1600    Infusions: . sodium chloride    . sodium chloride Stopped (10/28/19 0227)  . sodium chloride    . sodium chloride Stopped (11/02/19 1330)  . sodium chloride Stopped (11/03/19 1425)  . amiodarone 30 mg/hr (11/04/19 1012)  . cangrelor 50 mg in NS 250 mL 0.75 mcg/kg/min (11/04/19 1000)  .  dexmedetomidine (PRECEDEX) IV  infusion Stopped (11/04/19 0857)  . feeding supplement (VITAL 1.5 CAL) Stopped (11/02/19 1700)  . heparin 500 Units/hr (11/04/19 1000)  . lactated ringers Stopped (10/28/19 0624)  . lactated ringers    . meropenem (MERREM) IV Stopped (11/04/19 0531)  . milrinone 0.375 mcg/kg/min (11/04/19 1014)  . niCARDipine 15 mg/hr (11/04/19 1112)  . norepinephrine (LEVOPHED) Adult infusion 4 mcg/min (11/03/19 0908)  . TPN ADULT (ION) 80 mL/hr at 11/03/19 1852  . TPN ADULT (ION)    . vancomycin Stopped (11/04/19 0656)    PRN Medications: sodium chloride, sodium chloride, bisacodyl **OR** bisacodyl, hydrALAZINE, HYDROmorphone (DILAUDID) injection, metoprolol tartrate, midazolam, ondansetron (ZOFRAN) IV, ondansetron (ZOFRAN) IV, sodium chloride flush, sodium chloride flush   Assessment/Plan   1. Cardiogenic shock: Pre-OR echo with EF 45%, apical hypokinesis.  TEE in OR after VF arrest showed severe biventricular dysfunction. Placed on ECMO. Repeat echo 6/6 with EF 20-25%, RV function improved/near normal. Decannulated to Impella 5.5 on 6/7. Impella 5.5 removed in OR 6/11 - Continue current milrinone, wean off norepinephrine.  - PICC clotted (failed TPA x2) but I was able to get small sample of blood and we sent for co-ox - CVP 15. Continue lasix 80 mg IV bid - BP high. Will start Entresto. Wean nicardipine - Keep wound vac tody. Remove in am  2. Pericardial tamponade: Due to LAD perforation.  Now resolved in OR.  3. CAD: S/p DES to distal RCA on 5/25.  Has known CTO OM.  Had attempted intervention on CTO LAD today that was complicated by LAD perforation, now s/p LAD repair.  The LAD was not graftable, LAD territory therefore is not perfused except by pre-existing collaterals. Chest remains open. - Continue ASA 81/Cangrelor given recent RCA stent, transition back to Plavix when we think his gut is absorbing. Possibly tomorrow - Continue statin, atorvastatin 40 daily.   4. ETOH abuse: Watch for  withdrawal.   5. Smoking: Will need to quit.  6. Acute hypoxemic respiratory failure: He will awaken on event, follows commands.  - Now extubated. - Continue diuresis 7. Neuro: Nonfocal 8. GI: Suspect ileus. - Now getting TPN.  - IV Reglan and neostigmine.  - Bowel sounds improved 9. VF arrest: In OR.  Occasional PVCs with increase in Impella speed.  - Off lidocaine.  10. ID: Tm 99.2, CXR with right effusion.  - Continue vancomycin/meropenem.  - CX remain negative 11. RA thrombus: Discussed with Dr. Prescott Gum by phone after device removal today, there is some thrombus noted in RA on TEE in OR.   - Continue low dose heparin gtt today (rather than [place] heparin) and eventual warfarin.  12. Severe protein-calorie malnutrition - continue TPN for now.   CRITICAL CARE Performed by: Glori Bickers  Total critical care time: 40 minutes  Critical care time was exclusive of separately billable procedures and treating other patients.  Critical care was necessary to treat or prevent imminent or life-threatening deterioration.  Critical care was time spent personally by me on the following activities: development of treatment plan with patient and/or surrogate as well as nursing, discussions with consultants, evaluation of patient's response to treatment, examination of patient, obtaining history from patient or surrogate, ordering and performing treatments and interventions, ordering and review of laboratory studies, ordering and review of radiographic studies, pulse oximetry and re-evaluation of patient's condition.   Length of Stay: [place], MD  11/04/2019, 11:15 AM  Advanced Heart Failure Team Pager [phone] (M-F; 7a -  4p)  Please contact [facility] for night-coverage after hours (4p -7a ) and weekends on [email or web address]

## 2019-11-04 NOTE — Progress Notes (Signed)
      [address]       [place] [address]             [phone]      Stable day, comfortable presently Still having some frequent stools BP (!) 172/71   Pulse 92   Temp 98.1 F (36.7 C) (Oral)   Resp (!) 34   Ht 5' 8.5" (1.74 m)   Wt 93.1 kg   SpO2 94%   BMI 30.75 kg/m   Intake/Output Summary (Last 24 hours) at 11/04/2019 1742 Last data filed at 11/04/2019 1700 Gross per 24 hour  Intake 5253.23 ml  Output 3555 ml  Net 1698.23 ml   PICC line getting TPA to see if we can get co-ox/ labs from it.  Revonda Standard Roxan Hockey, MD [facility] [phone]

## 2019-11-04 NOTE — Progress Notes (Signed)
[place] for IV Heparin Indication: ECMO > impella > R atrial thrombus  No Known Allergies  Patient Measurements: Height: 5' 8.5" (174 cm) Weight: 93.1 kg (205 lb 4 oz) IBW/kg (Calculated) : 69.55 Heparin Dosing Weight: 83.9 kg  Vital Signs: Temp: 97.5 F (36.4 C) (06/12 0732) Temp Source: Oral (06/12 0732) BP: 152/67 (06/12 1000) Pulse Rate: 123 (06/12 0900)  Labs: Recent Labs    11/02/19 0513 11/02/19 0514 11/02/19 1555 11/02/19 1555 11/03/19 0428 11/03/19 0432 11/03/19 1556 11/03/19 1556 11/03/19 1619 11/03/19 1619 11/04/19 0501 11/04/19 0529 11/04/19 0531 11/04/19 0532  HGB 9.1*   < > 9.0*   < > 8.5*   < > 10.2*   < > 10.9*   < > 9.2* 9.6*  --   --   HCT 27.6*   < > 27.4*   < > 26.2*   < > 30.7*   < > 32.0*  --  27.0* 29.4*  --   --   PLT 207   < > 223   < > 245  --  254  --   --   --   --  277  --   --   LABPROT 13.3  --   --   --  13.6  --   --   --   --   --   --   --  13.4  --   INR 1.1  --   --   --  1.1  --   --   --   --   --   --   --  1.1  --   HEPARINUNFRC <0.10*   < > <0.10*  --  <0.10*  --   --   --   --   --   --   --   --  <0.10*  CREATININE 1.06   < > 0.95   < > 0.90  --  0.79  --   --   --   --   --  0.73  --    < > = values in this interval not displayed.    Estimated Creatinine Clearance: 127.6 mL/min (by C-G formula based on SCr of 0.73 mg/dL).   Medical History: Past Medical History:  Diagnosis Date  . Alcohol abuse   . GERD (gastroesophageal reflux disease)   . Tobacco abuse     Assessment: 49 year old male initiated on [place] ECMO> decannulated to impella 6/7.  Impella removed 6/11.  In OR, found RA thrombus noted on TEE.  Was started on heparin 500 units/hr (fixed rate). Heparin level remains undetectable.   Warfarin 2.5 mg x1 per MD to start tonight.  Hgb improved to 9.6 after 1 unit of PRBC given on 6/11.   Remains on cangrelor 0.75 mcg/kg/min until GI absorption improves, then  transition to plavix.   Goal of Therapy:  Eventually will target low heparin level goals, but for now, will keep fixed dose rate and monitor bleeding Monitor platelets by anticoagulation protocol: Yes   Plan:  Increase heparin to 800 units/hr (fixed dose, no titration 6/12) - f/u heparin level in AM and can rediscuss increase on 6/13 Continue cangrelor 0.56mg/kg/min Warfarin 2.5 mg tonight per MD Check heparin level and CBC daily  Monitor for s/sx bleeding  MVertis Kelch PharmD, BSelect Specialty Hospital - Grosse PointePGY2 Cardiology Pharmacy Resident Phone [phone]/04/2020       11:15 AM  Please check [email or web address] for unit-specific pharmacist  phone numbers

## 2019-11-04 NOTE — Progress Notes (Signed)
1 Day Post-Op Procedure(s) (LRB): REMOVAL OF IMPELLA 5.5 LEFT VENTRICULAR ASSIST DEVICE (N/A) TRANSESOPHAGEAL ECHOCARDIOGRAM (TEE) (N/A) Subjective: C/o frequent BM  Objective: Vital signs in last 24 hours: Temp:  [97.5 F (36.4 C)-99.2 F (37.3 C)] 97.5 F (36.4 C) (06/12 0732) Pulse Rate:  [38-98] 88 (06/12 0800) Cardiac Rhythm: Normal sinus rhythm (06/12 0800) Resp:  [11-37] 37 (06/12 0800) BP: (123-201)/(55-151) 145/75 (06/12 0800) SpO2:  [85 %-100 %] 95 % (06/12 0800) Arterial Line BP: (111-231)/(50-128) 111/73 (06/12 0330) FiO2 (%):  [50 %-100 %] 50 % (06/11 0957)  Hemodynamic parameters for last 24 hours: CVP:  [5 mmHg-8 mmHg] 8 mmHg  Intake/Output from previous day: 06/11 0701 - 06/12 0700 In: 4893 [I.V.:3167.1; Blood:315; IV Piggyback:1410.9] Out: 2761 [Urine:4000; Chest Tube:290] Intake/Output this shift: Total I/O In: -  Out: 50 [Urine:50]  General appearance: alert, cooperative and anxious Neurologic: intact Heart: regular rate and rhythm Lungs: diminished breath sounds bibasilar Abdomen: normal findings: soft, non-tender Wound: VAC in place  Lab Results: Recent Labs    11/03/19 1556 11/03/19 1619 11/04/19 0501 11/04/19 0529  WBC 18.8*  --   --  17.1*  HGB 10.2*   < > 9.2* 9.6*  HCT 30.7*   < > 27.0* 29.4*  PLT 254  --   --  277   < > = values in this interval not displayed.   BMET:  Recent Labs    11/03/19 1556 11/03/19 1619 11/04/19 0501 11/04/19 0531  NA 135   < > 135 136  K 3.7   < > 3.6 3.7  CL 98  --   --  98  CO2 29  --   --  22  GLUCOSE 133*  --   --  113*  BUN 13  --   --  15  CREATININE 0.79  --   --  0.73  CALCIUM 8.0*  --   --  7.9*   < > = values in this interval not displayed.    PT/INR:  Recent Labs    11/04/19 0531  LABPROT 13.4  INR 1.1   ABG    Component Value Date/Time   PHART 7.545 (H) 11/04/2019 0501   HCO3 27.7 11/04/2019 0501   TCO2 29 11/04/2019 0501   ACIDBASEDEF 1.0 10/31/2019 0644   O2SAT 94.0  11/04/2019 0501   CBG (last 3)  Recent Labs    11/03/19 2357 11/04/19 0429 11/04/19 0811  GLUCAP 153* 135* 135*    Assessment/Plan: S/P Procedure(s) (LRB): REMOVAL OF IMPELLA 5.5 LEFT VENTRICULAR ASSIST DEVICE (N/A) TRANSESOPHAGEAL ECHOCARDIOGRAM (TEE) (N/A) -CV- in SR, on milrinone at 0.375  Unable to get co-ox RESP_ still on high flow [place] at 14L- continue diuresis RENAL- diurese, creatinine normal ENDO- CBG well controlled GI- frequent BM after sorbitol and enema  Will try clear liquids, continue TNA for now ID- on Vanco and meropenem  LOS: 10 days    Melrose Nakayama 11/04/2019

## 2019-11-04 NOTE — Evaluation (Signed)
Clinical/Bedside Swallow Evaluation Patient Details  Name: William Crawford MRN: [id number] Date of Birth: 07/22/1970  Today's Date: 11/04/2019 Time: SLP Start Time (ACUTE ONLY): 0855 SLP Stop Time (ACUTE ONLY): 0915 SLP Time Calculation (min) (ACUTE ONLY): 20 min  Past Medical History:  Past Medical History:  Diagnosis Date  . Alcohol abuse   . GERD (gastroesophageal reflux disease)   . Tobacco abuse    Past Surgical History:  Past Surgical History:  Procedure Laterality Date  . APPLICATION OF WOUND VAC  10/25/2019   Procedure: Application Of Wound Vac;  Surgeon: [name], MD;  Location: [place];  Service: Open Heart Surgery;;  . CANNULATION FOR ECMO (EXTRACORPOREAL MEMBRANE OXYGENATION)  10/25/2019   Procedure: Cannulation For Ecmo (Extracorporeal Membrane Oxygenation) @ 3976;  Surgeon: [name], MD;  Location: [place];  Service: Open Heart Surgery;;  . CANNULATION FOR ECMO (EXTRACORPOREAL MEMBRANE OXYGENATION) N/A 10/30/2019   Procedure: DECANNULATION OF ECMO (EXTRACORPOREAL MEMBRANE OXYGENATION);  Surgeon: Prescott Gum, Collier Salina, MD;  Location: [place];  Service: Open Heart Surgery;  Laterality: N/A;  Pump standby  . CHEST EXPLORATION  10/25/2019   Procedure: Chest Exploration for repair of coronary perforation (repair of LAD);  Surgeon: Prescott Gum, Collier Salina, MD;  Location: [place];  Service: Open Heart Surgery;;  . CORONARY CTO INTERVENTION N/A 10/25/2019   Procedure: CORONARY CTO INTERVENTION;  Surgeon: Martinique, Peter M, MD;  Location: [place] CV [facility];  Service: Cardiovascular;  Laterality: N/A;  . CORONARY STENT INTERVENTION N/A 10/17/2019   Procedure: CORONARY STENT INTERVENTION;  Surgeon: Sherren Mocha, MD;  Location: [place] [facility];  Service: Cardiovascular;  Laterality: N/A;  . LEFT HEART CATH AND CORONARY ANGIOGRAPHY N/A 10/17/2019   Procedure: LEFT HEART CATH AND CORONARY ANGIOGRAPHY;  Surgeon: Sherren Mocha, MD;  Location: [place] [facility];  Service: Cardiovascular;   Laterality: N/A;  . MEDIASTERNOTOMY  10/25/2019   Procedure: Median Sternotomy.;  Surgeon: [name], MD;  Location: [place];  Service: Open Heart Surgery;;  . PLACEMENT OF IMPELLA LEFT VENTRICULAR ASSIST DEVICE  10/30/2019   Procedure: Placement Of Impella Left Ventricular Assist Device 5.5 USING 10MM HEMASHIELD PLATINUM GRAFT;  Surgeon: [name], MD;  Location: [place];  Service: Open Heart Surgery;;  graft placed into aorta  . STERNAL CLOSURE N/A 10/30/2019   Procedure: Sternal Closure;  Surgeon: [name], MD;  Location: [place];  Service: Open Heart Surgery;  Laterality: N/A;  . TEE WITHOUT CARDIOVERSION N/A 10/25/2019   Procedure: TRANSESOPHAGEAL ECHOCARDIOGRAM (TEE);  Surgeon: Prescott Gum, Collier Salina, MD;  Location: [place];  Service: Open Heart Surgery;  Laterality: N/A;  . TEE WITHOUT CARDIOVERSION N/A 10/30/2019   Procedure: TRANSESOPHAGEAL ECHOCARDIOGRAM (TEE);  Surgeon: Prescott Gum, Collier Salina, MD;  Location: [place];  Service: Open Heart Surgery;  Laterality: N/A;   HPI:  Patient is a 49 y.o. male with PMH: ETOH abuse, CAD s/p DES placement 5/25, tobacco use,  who was admitted on 6/2 following CTO PCI to LAD during which he developed a perforated LAD and went into vfib arrest. He then underwent emergent repair but was unable to be removed from bypass following procedure and PCCM was consulted for ECMO. He was initiated on [place] ECMO and decannulated to impella on 6/7. Impella removed 6/11. He was intubated 6/2 and extubated in AM on 6/11.   Assessment / Plan / Recommendation Clinical Impression  Patient presents with a mild pharyngeal phase dysphagia following 11 days intubation s/p cardiac procedures. Patient did  not exhibit any overt s/s of aspiration or penetration with boluses of thin liquids (water) via straw sips, and spoon bites of puree solids, however he did exhibit fluctuation in RR from 29 to 37, overt signs of increased SOB, WOB and anxiety. SLP Visit Diagnosis: Dysphagia, unspecified  (R13.10)    Aspiration Risk  Mild aspiration risk    Diet Recommendation Thin liquid (clear liquids, may have small amounts smooth puree (applesauce, pudding))   Liquid Administration via: Cup;Straw Supervision: Staff to assist with self feeding;Full supervision/cueing for compensatory strategies Compensations: Slow rate;Small sips/bites;Minimize environmental distractions Postural Changes: Seated upright at 90 degrees    Other  Recommendations Oral Care Recommendations: Oral care BID   Follow up Recommendations Other (comment) (TBD)      Frequency and Duration min 2x/week  1 week       Prognosis Prognosis for Safe Diet Advancement: Good      Swallow Study   General Date of Onset: 10/25/19 HPI: Patient is a 49 y.o. male with PMH: ETOH abuse, CAD s/p DES placement 5/25, tobacco use,  who was admitted on 6/2 following CTO PCI to LAD during which he developed a perforated LAD and went into vfib arrest. He then underwent emergent repair but was unable to be removed from bypass following procedure and PCCM was consulted for ECMO. He was initiated on [place] ECMO and decannulated to impella on 6/7. Impella removed 6/11. He was intubated 6/2 and extubated in AM on 6/11. Type of Study: Bedside Swallow Evaluation Previous Swallow Assessment: N/A Diet Prior to this Study: NPO Temperature Spikes Noted: No Respiratory Status: Nasal cannula History of Recent Intubation: Yes Length of Intubations (days): 10 days Date extubated: 11/03/19 Behavior/Cognition: Alert;Cooperative;Pleasant mood Oral Cavity Assessment: Within Functional Limits Oral Care Completed by SLP: Yes Oral Cavity - Dentition: Poor condition;Other (Comment) (majority of top row of teeth were significantly decayed) Self-Feeding Abilities: Able to feed self;Needs assist;Needs set up Patient Positioning: Upright in chair Baseline Vocal Quality: Hoarse (mildly hoarse) Volitional Cough: Strong Volitional Swallow: Able to elicit     Oral/Motor/Sensory Function Overall Oral Motor/Sensory Function: Within functional limits   Ice Chips Ice chips: Within functional limits Presentation: Spoon Other Comments: No overt s/s of aspiration or penetration   Thin Liquid Thin Liquid: Impaired Presentation: Straw Pharyngeal  Phase Impairments: Change in Vital Signs Other Comments: RR fluctuated between 29 and 37 during PO intake, patient appeared SOB and had to take breaks in between sips    Nectar Thick     Honey Thick     Puree Puree: Impaired Pharyngeal Phase Impairments: Change in Vital Signs Other Comments: Patient appeared SOB and had to take breaks in between bites, RR fluctuated between 29 and 37 during PO intake   Solid     Solid: Not tested      Sonia Baller, MA, CCC-SLP Speech Therapy [facility]

## 2019-11-05 ENCOUNTER — Inpatient Hospital Stay (HOSPITAL_COMMUNITY): Payer: BLUE CROSS/BLUE SHIELD

## 2019-11-05 LAB — GLUCOSE, CAPILLARY
Glucose-Capillary: 124 mg/dL — ABNORMAL HIGH (ref 70–99)
Glucose-Capillary: 127 mg/dL — ABNORMAL HIGH (ref 70–99)
Glucose-Capillary: 120 mg/dL — ABNORMAL HIGH (ref 70–99)
Glucose-Capillary: 127 mg/dL — ABNORMAL HIGH (ref 70–99)
Glucose-Capillary: 115 mg/dL — ABNORMAL HIGH (ref 70–99)
Glucose-Capillary: 106 mg/dL — ABNORMAL HIGH (ref 70–99)

## 2019-11-05 LAB — BASIC METABOLIC PANEL
Anion gap: 10 (ref 5–15)
Anion gap: 13 (ref 5–15)
Anion gap: 12 (ref 5–15)
BUN: 12 mg/dL (ref 6–20)
BUN: 13 mg/dL (ref 6–20)
BUN: 16 mg/dL (ref 6–20)
CO2: 21 mmol/L — ABNORMAL LOW (ref 22–32)
CO2: 22 mmol/L (ref 22–32)
CO2: 22 mmol/L (ref 22–32)
Calcium: 7.7 mg/dL — ABNORMAL LOW (ref 8.9–10.3)
Calcium: 8 mg/dL — ABNORMAL LOW (ref 8.9–10.3)
Calcium: 8.1 mg/dL — ABNORMAL LOW (ref 8.9–10.3)
Chloride: 97 mmol/L — ABNORMAL LOW (ref 98–111)
Chloride: 101 mmol/L (ref 98–111)
Chloride: 106 mmol/L (ref 98–111)
Creatinine, Ser: 0.68 mg/dL (ref 0.61–1.24)
Creatinine, Ser: 0.77 mg/dL (ref 0.61–1.24)
Creatinine, Ser: 0.71 mg/dL (ref 0.61–1.24)
GFR calc Af Amer: >60 mL/min (ref >60)
GFR calc Af Amer: >60 mL/min (ref >60)
GFR calc Af Amer: >60 mL/min (ref >60)
GFR calc non Af Amer: >60 mL/min (ref >60)
GFR calc non Af Amer: >60 mL/min (ref >60)
GFR calc non Af Amer: >60 mL/min (ref >60)
Glucose, Bld: 774 mg/dL (ref 70–99)
Glucose, Bld: 126 mg/dL — ABNORMAL HIGH (ref 70–99)
Glucose, Bld: 112 mg/dL — ABNORMAL HIGH (ref 70–99)
Potassium: 6.6 mmol/L (ref 3.5–5.1)
Potassium: 3.5 mmol/L (ref 3.5–5.1)
Potassium: 3.8 mmol/L (ref 3.5–5.1)
Sodium: 128 mmol/L — ABNORMAL LOW (ref 135–145)
Sodium: 136 mmol/L (ref 135–145)
Sodium: 140 mmol/L (ref 135–145)

## 2019-11-05 LAB — CBC
HCT: 32.4 % — ABNORMAL LOW (ref 39.0–52.0)
Hemoglobin: 10.6 g/dL — ABNORMAL LOW (ref 13.0–17.0)
MCH: 31.3 pg (ref 26.0–34.0)
MCHC: 32.7 g/dL (ref 30.0–36.0)
MCV: 95.6 fL (ref 80.0–100.0)
Platelets: 431 10*3/uL — ABNORMAL HIGH (ref 150–400)
RBC: 3.39 MIL/uL — ABNORMAL LOW (ref 4.22–5.81)
RDW: 15.5 % (ref 11.5–15.5)
WBC: 20.9 10*3/uL — ABNORMAL HIGH (ref 4.0–10.5)
nRBC: 0 % (ref 0.0–0.2)

## 2019-11-05 LAB — HEPATIC FUNCTION PANEL
ALT: 76 U/L — ABNORMAL HIGH (ref 0–44)
AST: 62 U/L — ABNORMAL HIGH (ref 15–41)
Albumin: 2 g/dL — ABNORMAL LOW (ref 3.5–5.0)
Alkaline Phosphatase: 109 U/L (ref 38–126)
Bilirubin, Direct: 0.2 mg/dL (ref 0.0–0.2)
Indirect Bilirubin: 0.4 mg/dL (ref 0.3–0.9)
Total Bilirubin: 0.6 mg/dL (ref 0.3–1.2)
Total Protein: 5.2 g/dL — ABNORMAL LOW (ref 6.5–8.1)

## 2019-11-05 LAB — CULTURE, BLOOD (ROUTINE X 2)
Culture: NO GROWTH
Culture: NO GROWTH
Special Requests: ADEQUATE

## 2019-11-05 LAB — MAGNESIUM
Magnesium: 2.5 mg/dL — ABNORMAL HIGH (ref 1.7–2.4)
Magnesium: 2.3 mg/dL (ref 1.7–2.4)

## 2019-11-05 LAB — LACTATE DEHYDROGENASE: LDH: 430 U/L — ABNORMAL HIGH (ref 98–192)

## 2019-11-05 LAB — PROTIME-INR
INR: 1.2 (ref 0.8–1.2)
Prothrombin Time: 14.5 seconds (ref 11.4–15.2)

## 2019-11-05 LAB — PHOSPHORUS
Phosphorus: 7.8 mg/dL — ABNORMAL HIGH (ref 2.5–4.6)
Phosphorus: 3.1 mg/dL (ref 2.5–4.6)

## 2019-11-05 LAB — HEPARIN LEVEL (UNFRACTIONATED): Heparin Unfractionated: <0.1 IU/mL — ABNORMAL LOW (ref 0.30–0.70)

## 2019-11-05 MED ORDER — CLOPIDOGREL BISULFATE 75 MG PO TABS
75.0000 mg | ORAL_TABLET | Freq: Every day | ORAL | Status: DC
  Administered 2019-11-05 – 2019-11-11 (×7): 75 mg via ORAL
  Filled 2019-11-05 (×7): qty 1

## 2019-11-05 MED ORDER — BENZONATATE 100 MG PO CAPS
100.0000 mg | ORAL_CAPSULE | Freq: Three times a day (TID) | ORAL | Status: DC | PRN
  Administered 2019-11-05: 100 mg via ORAL
  Filled 2019-11-05: qty 1

## 2019-11-05 MED ORDER — WARFARIN SODIUM 2.5 MG PO TABS
2.5000 mg | ORAL_TABLET | Freq: Once | ORAL | Status: AC
  Administered 2019-11-05: 2.5 mg
  Filled 2019-11-05: qty 1

## 2019-11-05 MED ORDER — KETOROLAC TROMETHAMINE 30 MG/ML IJ SOLN
30.0000 mg | Freq: Four times a day (QID) | INTRAMUSCULAR | Status: AC | PRN
  Administered 2019-11-05 (×2): 30 mg via INTRAVENOUS
  Filled 2019-11-05: qty 1

## 2019-11-05 MED ORDER — POTASSIUM CHLORIDE CRYS ER 20 MEQ PO TBCR
20.0000 meq | EXTENDED_RELEASE_TABLET | ORAL | Status: AC
  Administered 2019-11-05 – 2019-11-06 (×3): 20 meq via ORAL
  Filled 2019-11-05 (×3): qty 1

## 2019-11-05 MED ORDER — SACUBITRIL-VALSARTAN 97-103 MG PO TABS
1.0000 | ORAL_TABLET | Freq: Two times a day (BID) | ORAL | Status: DC
  Administered 2019-11-05 – 2019-11-11 (×13): 1 via ORAL
  Filled 2019-11-05 (×15): qty 1

## 2019-11-05 MED ORDER — SPIRONOLACTONE 12.5 MG HALF TABLET: 12.5000 mg | ORAL_TABLET | Freq: Once | ORAL | Status: DC

## 2019-11-05 MED ORDER — SPIRONOLACTONE 12.5 MG HALF TABLET
12.5000 mg | ORAL_TABLET | Freq: Every day | ORAL | Status: DC
  Administered 2019-11-05: 12.5 mg via ORAL
  Filled 2019-11-05: qty 1

## 2019-11-05 MED ORDER — TRACE MINERALS CU-MN-SE-ZN 300-55-60-3000 MCG/ML IV SOLN
INTRAVENOUS | Status: AC
  Filled 2019-11-05: qty 934.4

## 2019-11-05 MED ORDER — POTASSIUM CHLORIDE CRYS ER 20 MEQ PO TBCR
20.0000 meq | EXTENDED_RELEASE_TABLET | ORAL | Status: AC
  Administered 2019-11-05 (×3): 20 meq via ORAL
  Filled 2019-11-05 (×3): qty 1

## 2019-11-05 NOTE — Progress Notes (Signed)
Anesthesiology Follow-up:  Awake and alert, neuro intact in good spirits, hemodynamically stable.  VS: T- 36.7 BP- 137/72 HR- 91 (SR) RR- 28 O2 Sat 96% on HFNC  On milrinone, nicardipine, amiodarone, heparin, cangrelor   K- 3.5 BUN/Cr.- 13/0.77 glucose- 126 H/H- 10.6/32.7 platelets- 49,13  49 year old male S/P LAD perforation requiring emergency sternotomy (6/2), placed on ECMO support, ECMO decannulation on 6/7 with insertion of Impella, Impella removed on 6/11. Extubated yesterday. R. atrial thrombi noted on TEE in OR on 6/11 with LV EF 45% and normal RV function. Now on cangrelor and heparin. Overall, doing well with remarkable improvement.  Roberts Gaudy

## 2019-11-05 NOTE — Progress Notes (Signed)
[place] for IV Heparin Indication: ECMO > impella > R atrial thrombus  No Known Allergies  Patient Measurements: Height: 5' 8.5" (174 cm) Weight: 93.3 kg (205 lb 11 oz) IBW/kg (Calculated) : 69.55 Heparin Dosing Weight: 83.9 kg  Vital Signs: Temp: 99 F (37.2 C) (06/13 0749) Temp Source: Oral (06/13 0300) BP: 155/77 (06/13 0800) Pulse Rate: 93 (06/13 0800)  Labs: Recent Labs    11/03/19 0428 11/03/19 0432 11/03/19 1556 11/04/19 0529 11/04/19 0529 11/04/19 0531 11/04/19 0532 11/04/19 1756 11/05/19 0400 11/05/19 0532  HGB 8.5*   < >  --  9.6*   < >  --   --  10.4* 10.6*  --   HCT 26.2*   < >  --  29.4*  --   --   --  31.5* 32.4*  --   PLT 245   < >  --  277  --   --   --  371 431*  --   LABPROT 13.6  --   --   --   --  13.4  --   --  14.5  --   INR 1.1  --   --   --   --  1.1  --   --  1.2  --   HEPARINUNFRC <0.10*  --   --   --   --   --  <0.10*  --  <0.10*  --   CREATININE 0.90   < >   < >  --    < > 0.73  --  0.76 0.68 0.77   < > = values in this interval not displayed.    Estimated Creatinine Clearance: 127.7 mL/min (by C-G formula based on SCr of 0.77 mg/dL).   Medical History: Past Medical History:  Diagnosis Date  . Alcohol abuse   . GERD (gastroesophageal reflux disease)   . Tobacco abuse     Assessment: 49 year old male initiated on [place] ECMO> decannulated to impella 6/7.  Impella removed 6/11.  In OR, found RA thrombus noted on TEE.  Was started on heparin 500 units/hr (fixed rate). Heparin level remains undetectable.   Warfarin 2.5 mg x1 per MD started on 6/12  Hgb improved to 10.6 after 1 unit of PRBC given on 6/11.   Now off cangrelor and transitioning to clopidogrel   Goal of Therapy:  Eventually will target low heparin level goals, but for now, will keep fixed dose rate and monitor bleeding Monitor platelets by anticoagulation protocol: Yes   Plan:  Continue heparin at 800 units/hr (fixed dose,  no titration 6/12) - f/u heparin level in AM and can rediscuss increase on 6/14 Stop cangrelor, start clopidogrel 75 mg daily Warfarin 2.5 mg tonight per MD Check heparin level and CBC daily  Monitor for s/sx bleeding  Vertis Kelch, PharmD, [facility] PGY2 Cardiology Pharmacy Resident Phone [phone] 11/05/2019       9:20 AM  Please check [email or web address] for unit-specific pharmacist phone numbers

## 2019-11-05 NOTE — Progress Notes (Signed)
Inpatient Rehab Admissions Coordinator Note:   Per therapy recommendations, pt was screened for CIR candidacy by Clemens Catholic, [place] CCC-SLP. At this time, Pt. Appears to have functional decline and is a good candidate for CIR. Rehab consult order requested. Please contact me with questions.   Clemens Catholic, [place], [place] Admissions Coordinator  [phone] ([place]) [phone] (office)

## 2019-11-05 NOTE — Progress Notes (Signed)
2 Days Post-Op Procedure(s) (LRB): REMOVAL OF IMPELLA 5.5 LEFT VENTRICULAR ASSIST DEVICE (N/A) TRANSESOPHAGEAL ECHOCARDIOGRAM (TEE) (N/A) Subjective: Pain and diarrhea better  Objective: Vital signs in last 24 hours: Temp:  [97.9 F (36.6 C)-100 F (37.8 C)] 99 F (37.2 C) (06/13 0749) Pulse Rate:  [41-123] 93 (06/13 0800) Cardiac Rhythm: Normal sinus rhythm (06/13 0800) Resp:  [20-37] 28 (06/13 0800) BP: (123-172)/(51-112) 155/77 (06/13 0800) SpO2:  [90 %-100 %] 97 % (06/13 0800) Weight:  [93.3 kg] 93.3 kg (06/13 0600)  Hemodynamic parameters for last 24 hours: CVP:  [10 mmHg-15 mmHg] 10 mmHg  Intake/Output from previous day: 06/12 0701 - 06/13 0700 In: 5279.8 [I.V.:4079.8; IV Piggyback:900] Out: 4386 [Urine:4275; Stool:1; Chest Tube:110] Intake/Output this shift: Total I/O In: 183.9 [I.V.:183.9] Out: -   General appearance: alert, cooperative and no distress Neurologic: intact Heart: regular rate and rhythm Lungs: rhonchi bilaterally Abdomen: normal findings: soft, non-tender Wound: VAC in place  Lab Results: Recent Labs    11/04/19 1756 11/05/19 0400  WBC 22.0* 20.9*  HGB 10.4* 10.6*  HCT 31.5* 32.4*  PLT 371 431*   BMET:  Recent Labs    11/05/19 0400 11/05/19 0532  NA 128* 136  K 6.6* 3.5  CL 97* 101  CO2 21* 22  GLUCOSE 774* 126*  BUN 12 13  CREATININE 0.68 0.77  CALCIUM 7.7* 8.0*    PT/INR:  Recent Labs    11/05/19 0400  LABPROT 14.5  INR 1.2   ABG    Component Value Date/Time   PHART 7.545 (H) 11/04/2019 0501   HCO3 27.7 11/04/2019 0501   TCO2 29 11/04/2019 0501   ACIDBASEDEF 1.0 10/31/2019 0644   O2SAT 71.6 11/04/2019 1747   CBG (last 3)  Recent Labs    11/05/19 0330 11/05/19 0641 11/05/19 0747  GLUCAP 124* 127* 120*    Assessment/Plan: S/P Procedure(s) (LRB): REMOVAL OF IMPELLA 5.5 LEFT VENTRICULAR ASSIST DEVICE (N/A) TRANSESOPHAGEAL ECHOCARDIOGRAM (TEE) (N/A) - CV- co-ox 71, on milrinone 0.375 RESP_ down to 6L  HFNC RENAL- creatinine normal, supplement K ENDO- CBG well controlled GI- still with frequent BM although has improved  Stop Reglan  Tolerated clears- will advance diet ID- Vanco and meropenem, WBC still elevated but down slightly    LOS: 11 days    Melrose Nakayama 11/05/2019

## 2019-11-05 NOTE — Addendum Note (Signed)
Addendum  created 11/05/19 0759 by Roberts Gaudy, MD   Clinical Note Signed

## 2019-11-05 NOTE — Progress Notes (Signed)
PHARMACY - TOTAL PARENTERAL NUTRITION CONSULT NOTE   Indication: prolonged ileus; intolerance to enteral nutrition   Patient Measurements: Height: 5' 8.5" (174 cm) Weight: 93.3 kg (205 lb 11 oz) IBW/kg (Calculated) : 69.55 TPN AdjBW (KG): 83.9 Body mass index is 30.82 kg/m. Usual Weight: Baseline wt unknown; 83 kg on admission  Assessment: 49 yo male presented on 10/25/2019. Patient underwent PCI to LAD and developed perforated LAD and went into VFib arrest and emergent repair. Patient was started on ECMO on 6/2 and decannulated on 6/7 with placement of Impella. Patient has had inadequate nutrition for 8 days. Cortrak was placed on 6/8 and patient has been unable to advance from trickle feeds with high residuals (350-600 mLs). Concern of SBO - no bowel sounds and contrast did not advance on imaging. Pharmacy consulted to start TPN. Of note, EF 30-35%, now improved to 40-45% and receiving IV diuresis. S/p removal of Impella 6/11 and extubated.  Glucose / Insulin: No hx of DM. A1c 5.2. CBGs controlled. On sSSI q4h - utilized 4 units/24 hrs   Electrolytes: K 3.5 (s/p K runs x 6 total; on Lasix 80 IV BID). Phos up to 3.1 (s/p increase in TPN). Ionized Ca 1.08. Others wnl.  Renal: Scr stable wnl - at baseline LFTs / TGs: LFTs mildly elevated, trend down. Tbili WNL. TG 155  Prealbumin / albumin: Albumin 2.0. Prealbumin 10.2. Intake / Output; MIVF: no MIVF. +5.8L and undergoing diuresis. UOP 1.9 ml/kg/hr. Chest tubes 9m/24 hr output, NGT 016m24 hr output. Frequent loose stools reported 6/12 GI Imaging: - 6/8 x-ray: soft feeding tube could not be passed through pylorus  Surgeries / Procedures:  - 6/2: LAD perforation during DES, VFib arrest, emergent repair, ECMO - 6/7: ECMO decannulation, Impella - 6/11: impella removed, extubated Central access: PICC TPN start date: 6/10  Nutritional Goals (per RD recommendations on 6/10): kCal: 2100-2500, Protein: 126-151, Fluid: >2L Goal TPN rate is 80  mL/hr (provides 140 g of protein and 2191 kcals per day)  Current Nutrition:  Advance to cardiac diet 6/13 TPN  Plan:  Continue TPN at goal rate 8044mr at 1800. Per discussion with Dr. BenHaroldine Lawse would like to keep full TPN today and likely stop tomorrow. However, he will defer the final decision to Dr. VanPrescott Gumjust electrolytes in TPN: no changes today Continued electrolytes in TPN: Phos 25 mmol/L, 34m81m of Na, 34mE25mof K, 5mEq/71mf Ca, 5mEq/L33m Mg, Cl:Ac ratio: max chloride Add standard MVI and trace elements to TPN Continue sensitive SSI q4h and adjust as needed  Monitor TPN labs F/u toleration/absorption of PO and ability to wean TPN as appropriate  KCl 20meq P56mh x 3 already ordered per CVTS   Kaijah Abts voArturo Morton, BCPS Please check AMION for all [facility] numbers Clinical Pharmacist 11/05/2019 7:29 AM

## 2019-11-05 NOTE — Progress Notes (Addendum)
Patient ID: William Crawford, male   DOB: 17-Sep-1970, 49 y.o.   MRN: [id number]     Advanced Heart Failure Rounding Note  PCP-Cardiologist: No primary care provider on file.   Subjective:    - LAD repair + [place] ECMO cannulation in OR 6/3. Chest wall open. - 6/7: ECMO decannulation, Impella 5.5 placement  - 6/11: Impella 5.5 removal.    NE off. Remains on milrinone 0.375. Nicardipine is off after Entresto started. No co-ox due to clotted PICC. Co-ox yesterday was 72%  Remains on TPN but now advancing diet. On cangrelor and heparin  Sitting up in chair. Telling jokes. Denies SOB, orthopnea or PND.    - Echo (6/3): EF < 20%, severe RV dysfunction - Echo (6/6): EF 20-25%, normal RV function - Echo (6/8): EF 25-30%, normal RV - Echo (6/10): EF 40%, normal RV    Objective:   Weight Range: 93.3 kg Body mass index is 30.82 kg/m.   Vital Signs:   Temp:  [97.6 F (36.4 C)-100 F (37.8 C)] 97.6 F (36.4 C) (06/13 1208) Pulse Rate:  [41-107] 91 (06/13 1200) Resp:  [23-37] 29 (06/13 1200) BP: (123-172)/(60-89) 129/60 (06/13 1200) SpO2:  [89 %-100 %] 98 % (06/13 1200) Weight:  [93.3 kg] 93.3 kg (06/13 0600) Last BM Date: 11/05/19  Weight change: Filed Weights   11/02/19 0600 11/03/19 0555 11/05/19 0600  Weight: 93.6 kg 93.1 kg 93.3 kg    Intake/Output:   Intake/Output Summary (Last 24 hours) at 11/05/2019 1329 Last data filed at 11/05/2019 1200 Gross per 24 hour  Intake 5296.12 ml  Output 5886 ml  Net -589.88 ml      Physical Exam    General:  Sitting up in bed  No resp difficulty HEENT: normal Neck: supple. no JVD. Carotids 2+ bilat; no bruits. No lymphadenopathy or thryomegaly appreciated. Cor: PMI nondisplaced. + wound vac Regular rate & rhythm. No rubs, gallops or murmurs. Lungs: coarse Abdomen: obese soft, nontender, nondistended. No hepatosplenomegaly. No bruits or masses. Good bowel sounds. Extremities: no cyanosis, clubbing, rash, edema Neuro: alert &  orientedx3, cranial nerves grossly intact. moves all 4 extremities w/o difficulty. Affect pleasant   Telemetry   NSR 90s Personally reviewed  Labs    CBC Recent Labs    11/03/19 0428 11/03/19 0432 11/04/19 1756 11/05/19 0400  WBC 11.8*   < > 22.0* 20.9*  NEUTROABS 6.9  --   --   --   HGB 8.5*   < > 10.4* 10.6*  HCT 26.2*   < > 31.5* 32.4*  MCV 89.7   < > 87.7 95.6  PLT 245   < > 371 431*   < > = values in this interval not displayed.   Basic Metabolic Panel Recent Labs    11/05/19 0400 11/05/19 0532  NA 128* 136  K 6.6* 3.5  CL 97* 101  CO2 21* 22  GLUCOSE 774* 126*  BUN 12 13  CREATININE 0.68 0.77  CALCIUM 7.7* 8.0*  MG 2.5* 2.3  PHOS 7.8* 3.1   Liver Function Tests Recent Labs    11/04/19 0531 11/05/19 0400  AST 121* 62*  ALT 108* 76*  ALKPHOS 139* 109  BILITOT 0.7 0.6  PROT 5.7* 5.2*  ALBUMIN 2.3* 2.0*   No results for input(s): LIPASE, AMYLASE in the last 72 hours. Cardiac Enzymes No results for input(s): CKTOTAL, CKMB, CKMBINDEX, TROPONINI in the last 72 hours.  BNP: BNP (last 3 results) Recent Labs    10/17/19 0605  BNP 608.8*    ProBNP (last 3 results) No results for input(s): PROBNP in the last 8760 hours.   D-Dimer No results for input(s): DDIMER in the last 72 hours. Hemoglobin A1C No results for input(s): HGBA1C in the last 72 hours. Fasting Lipid Panel Recent Labs    11/03/19 0428  TRIG 155*   Thyroid Function Tests No results for input(s): TSH, T4TOTAL, T3FREE, THYROIDAB in the last 72 hours.  Invalid input(s): FREET3  Other results:   Imaging    DG CHEST PORT 1 VIEW  Result Date: 11/05/2019 CLINICAL DATA:  Chest tube EXAM: PORTABLE CHEST 1 VIEW COMPARISON:  11/04/2019 FINDINGS: Changes of CABG. Support devices including right chest tube remain in place, unchanged. No pneumothorax. Cardiomegaly with vascular congestion. Areas of atelectasis in the lung bases bilaterally. No visible effusions. IMPRESSION:  Postoperative changes. Right chest tube in place without pneumothorax. Cardiomegaly, vascular congestion. Bibasilar atelectasis. Electronically Signed   By: Rolm Baptise M.D.   On: 11/05/2019 01:42     Medications:     Scheduled Medications: . aspirin  81 mg Per Tube Daily  . atorvastatin  40 mg Per Tube Daily  . chlorhexidine gluconate (MEDLINE KIT)  15 mL Mouth Rinse BID  . Chlorhexidine Gluconate Cloth  6 each Topical Daily  . docusate  200 mg Per Tube Daily  . folic acid  1 mg Intravenous Daily  . furosemide  80 mg Intravenous BID  . insulin aspart  0-9 Units Subcutaneous Q4H  . ketotifen  1 drop Both Eyes BID  . mouth rinse  15 mL Mouth Rinse BID  . neostigmine  0.25 mg Subcutaneous Q6H  . pantoprazole (PROTONIX) IV  40 mg Intravenous QHS  . potassium chloride  20 mEq Oral Q4H  . sacubitril-valsartan  1 tablet Oral BID  . sodium chloride flush  10-40 mL Intracatheter Q12H  . thiamine injection  100 mg Intravenous Daily  . Warfarin - Physician Dosing Inpatient   Does not apply q1600    Infusions: . sodium chloride Stopped (10/28/19 0227)  . sodium chloride Stopped (11/03/19 1425)  . amiodarone 30 mg/hr (11/05/19 1200)  . cangrelor 50 mg in NS 250 mL 0.75 mcg/kg/min (11/05/19 1200)  . dexmedetomidine (PRECEDEX) IV infusion Stopped (11/04/19 0857)  . feeding supplement (VITAL 1.5 CAL) Stopped (11/02/19 1700)  . heparin 800 Units/hr (11/05/19 1200)  . milrinone 0.375 mcg/kg/min (11/05/19 1200)  . niCARDipine 10 mg/hr (11/05/19 1200)  . TPN ADULT (ION) 80 mL/hr at 11/05/19 1200  . TPN ADULT (ION)      PRN Medications: sodium chloride, benzonatate, bisacodyl **OR** bisacodyl, hydrALAZINE, HYDROmorphone (DILAUDID) injection, ketorolac, metoprolol tartrate, midazolam, ondansetron (ZOFRAN) IV, oxyCODONE, sodium chloride flush, sodium chloride flush   Assessment/Plan   1. Cardiogenic shock: Pre-OR echo with EF 45%, apical hypokinesis.  TEE in OR after VF arrest showed severe  biventricular dysfunction. Placed on ECMO. Repeat echo 6/6 with EF 20-25%, RV function improved/near normal. Decannulated to Impella 5.5 on 6/7. Impella 5.5 removed in OR 6/11 - Off NE. Co-ox yesterday was good. Drop milrinone to 0.25 - Volume status improving but weight still up 15 pounds. Continue IV lasix - BP improved with Entresto. Nicardipine off overnight but now back on. Increase entresto to ful dose. Add spiro 12.5.  - I removed wound vac personally and site looks good.  2. Pericardial tamponade: Due to LAD perforation.  Now resolved in OR.  3. CAD: S/p DES to distal RCA on 5/25.  Has known CTO OM.  Had attempted intervention on CTO LAD today that was complicated by LAD perforation, now s/p LAD repair.  The LAD was not graftable, LAD territory therefore is not perfused except by pre-existing collaterals. Chest remains open. - Has recent recent RCA stent. Now taking pos. Can switch cangrelor back to Plavix. Continue ASA.  - Continue statin, atorvastatin 40 daily.   4. ETOH abuse: Watch for withdrawal.   5. Smoking: Will need to quit.  6. Acute hypoxemic respiratory failure: He will awaken on event, follows commands.  - On [place] 7. Neuro: Nonfocal 8. GI: Suspect ileus. - Now getting TPN.  - TCTS advancing diet.  - Can likely stop TPN in am. Check prealbumin 9. VF arrest: In OR.  Occasional PVCs with increase in Impella speed.  - Off lidocaine.  10. ID: Tm 100.0, CXR with right effusion.  - Completed vancomycin/meropenem.  - CX remain negative 11. RA thrombus: Discussed with Dr. Prescott Gum by phone after device removal today, there is some thrombus noted in RA on TEE in OR.   - Continue low dose heparin gtt today. Start warfarin. 12. Severe protein-calorie malnutrition - continue TPN for now. Likely stop in am   Length of Stay: [place], MD  11/05/2019, 1:29 PM  Advanced Heart Failure Team Pager [phone] (M-F; [place])  Please contact [facility] for night-coverage  after hours (4p -7a ) and weekends on [email or web address]

## 2019-11-05 NOTE — Progress Notes (Signed)
      [address]       [place], [address]             [phone]      Stable day BP 136/77   Pulse (!) 51   Temp 97.8 F (36.6 C) (Oral)   Resp (!) 26   Ht 5' 8.5" (1.74 m)   Wt 93.3 kg   SpO2 90%   BMI 30.82 kg/m   Intake/Output Summary (Last 24 hours) at 11/05/2019 1941 Last data filed at 11/05/2019 1900 Gross per 24 hour  Intake 5369.93 ml  Output 7090 ml  Net -1720.07 ml    Diuresing well Supplement K Milrinone down to 0.25  Remo Lipps C. Roxan Hockey, MD [facility] [phone]

## 2019-11-05 NOTE — Progress Notes (Signed)
Spoke with IV pharmacist regarding heparin and cangrelor infiltration. Pharmacy recommends aspirating as much medication out, remove IV, elevate arm, and monitor site. RN made aware.

## 2019-11-06 ENCOUNTER — Inpatient Hospital Stay (HOSPITAL_COMMUNITY): Payer: BLUE CROSS/BLUE SHIELD

## 2019-11-06 ENCOUNTER — Encounter (HOSPITAL_COMMUNITY): Payer: Self-pay | Admitting: Cardiothoracic Surgery

## 2019-11-06 LAB — MAGNESIUM: Magnesium: 2.3 mg/dL (ref 1.7–2.4)

## 2019-11-06 LAB — COMPREHENSIVE METABOLIC PANEL
ALT: 327 U/L — ABNORMAL HIGH (ref 0–44)
AST: 333 U/L — ABNORMAL HIGH (ref 15–41)
Albumin: 2.7 g/dL — ABNORMAL LOW (ref 3.5–5.0)
Alkaline Phosphatase: 211 U/L — ABNORMAL HIGH (ref 38–126)
Anion gap: 13 (ref 5–15)
BUN: 16 mg/dL (ref 6–20)
CO2: 20 mmol/L — ABNORMAL LOW (ref 22–32)
Calcium: 8.4 mg/dL — ABNORMAL LOW (ref 8.9–10.3)
Chloride: 105 mmol/L (ref 98–111)
Creatinine, Ser: 0.8 mg/dL (ref 0.61–1.24)
GFR calc Af Amer: >60 mL/min (ref >60)
GFR calc non Af Amer: >60 mL/min (ref >60)
Glucose, Bld: 107 mg/dL — ABNORMAL HIGH (ref 70–99)
Potassium: 4.1 mmol/L (ref 3.5–5.1)
Sodium: 138 mmol/L (ref 135–145)
Total Bilirubin: 0.7 mg/dL (ref 0.3–1.2)
Total Protein: 6.7 g/dL (ref 6.5–8.1)

## 2019-11-06 LAB — CBC
HCT: 39.9 % (ref 39.0–52.0)
Hemoglobin: 12.7 g/dL — ABNORMAL LOW (ref 13.0–17.0)
MCH: 29 pg (ref 26.0–34.0)
MCHC: 31.8 g/dL (ref 30.0–36.0)
MCV: 91.1 fL (ref 80.0–100.0)
Platelets: 468 10*3/uL — ABNORMAL HIGH (ref 150–400)
RBC: 4.38 MIL/uL (ref 4.22–5.81)
RDW: 15 % (ref 11.5–15.5)
WBC: 19.2 10*3/uL — ABNORMAL HIGH (ref 4.0–10.5)
nRBC: 0 % (ref 0.0–0.2)

## 2019-11-06 LAB — DIFFERENTIAL
Abs Immature Granulocytes: 0.46 10*3/uL — ABNORMAL HIGH (ref 0.00–0.07)
Basophils Absolute: 0.2 10*3/uL — ABNORMAL HIGH (ref 0.0–0.1)
Basophils Relative: 1 %
Eosinophils Absolute: 0.7 10*3/uL — ABNORMAL HIGH (ref 0.0–0.5)
Eosinophils Relative: 4 %
Immature Granulocytes: 2 %
Lymphocytes Relative: 17 %
Lymphs Abs: 3.3 10*3/uL (ref 0.7–4.0)
Monocytes Absolute: 1.2 10*3/uL — ABNORMAL HIGH (ref 0.1–1.0)
Monocytes Relative: 6 %
Neutro Abs: 13.4 10*3/uL — ABNORMAL HIGH (ref 1.7–7.7)
Neutrophils Relative %: 70 %

## 2019-11-06 LAB — GLUCOSE, CAPILLARY
Glucose-Capillary: 103 mg/dL — ABNORMAL HIGH (ref 70–99)
Glucose-Capillary: 103 mg/dL — ABNORMAL HIGH (ref 70–99)
Glucose-Capillary: 106 mg/dL — ABNORMAL HIGH (ref 70–99)
Glucose-Capillary: 114 mg/dL — ABNORMAL HIGH (ref 70–99)
Glucose-Capillary: 119 mg/dL — ABNORMAL HIGH (ref 70–99)
Glucose-Capillary: 120 mg/dL — ABNORMAL HIGH (ref 70–99)

## 2019-11-06 LAB — PROTIME-INR
INR: 1.1 (ref 0.8–1.2)
Prothrombin Time: 13.6 seconds (ref 11.4–15.2)

## 2019-11-06 LAB — TRIGLYCERIDES: Triglycerides: 123 mg/dL (ref <150)

## 2019-11-06 LAB — LACTATE DEHYDROGENASE: LDH: 744 U/L — ABNORMAL HIGH (ref 98–192)

## 2019-11-06 LAB — HEPARIN LEVEL (UNFRACTIONATED): Heparin Unfractionated: <0.1 IU/mL — ABNORMAL LOW (ref 0.30–0.70)

## 2019-11-06 LAB — PHOSPHORUS: Phosphorus: 4 mg/dL (ref 2.5–4.6)

## 2019-11-06 LAB — PREALBUMIN: Prealbumin: 16.9 mg/dL — ABNORMAL LOW (ref 18–38)

## 2019-11-06 MED ORDER — WARFARIN SODIUM 5 MG PO TABS: 5.0000 mg | ORAL_TABLET | Freq: Once | ORAL | Status: DC

## 2019-11-06 MED ORDER — ATORVASTATIN CALCIUM 40 MG PO TABS: 40.0000 mg | ORAL_TABLET | Freq: Every day | ORAL | Status: DC

## 2019-11-06 MED ORDER — PRO-STAT SUGAR FREE PO LIQD
30.0000 mL | Freq: Two times a day (BID) | ORAL | Status: DC
  Administered 2019-11-07 – 2019-11-10 (×7): 30 mL via ORAL
  Filled 2019-11-06 (×7): qty 30

## 2019-11-06 MED ORDER — MIDAZOLAM HCL 2 MG/2ML IJ SOLN
1.0000 mg | Freq: Four times a day (QID) | INTRAMUSCULAR | Status: DC | PRN
  Administered 2019-11-06: 1 mg via INTRAVENOUS
  Filled 2019-11-06 (×2): qty 2

## 2019-11-06 MED ORDER — SPIRONOLACTONE 25 MG PO TABS
25.0000 mg | ORAL_TABLET | Freq: Every day | ORAL | Status: DC
  Administered 2019-11-06 – 2019-11-11 (×6): 25 mg via ORAL
  Filled 2019-11-06 (×6): qty 1

## 2019-11-06 MED ORDER — INSULIN ASPART 100 UNIT/ML ~~LOC~~ SOLN
0.0000 [IU] | Freq: Three times a day (TID) | SUBCUTANEOUS | Status: DC
  Administered 2019-11-09: 1 [IU] via SUBCUTANEOUS

## 2019-11-06 MED ORDER — WARFARIN SODIUM 5 MG PO TABS
5.0000 mg | ORAL_TABLET | Freq: Once | ORAL | Status: AC
  Administered 2019-11-06: 5 mg via ORAL
  Filled 2019-11-06: qty 1

## 2019-11-06 MED ORDER — ENSURE ENLIVE PO LIQD
237.0000 mL | Freq: Three times a day (TID) | ORAL | Status: DC
  Administered 2019-11-08 – 2019-11-11 (×10): 237 mL via ORAL

## 2019-11-06 MED ORDER — AMIODARONE HCL 200 MG PO TABS
400.0000 mg | ORAL_TABLET | Freq: Two times a day (BID) | ORAL | Status: DC
  Administered 2019-11-06 – 2019-11-11 (×11): 400 mg via ORAL
  Filled 2019-11-06 (×11): qty 2

## 2019-11-06 MED ORDER — WARFARIN - PHARMACIST DOSING INPATIENT
Freq: Every day | Status: DC
  Administered 2019-11-07: 1

## 2019-11-06 NOTE — Progress Notes (Signed)
3 Days Post-Op Procedure(s) (LRB): REMOVAL OF IMPELLA 5.5 LEFT VENTRICULAR ASSIST DEVICE (N/A) TRANSESOPHAGEAL ECHOCARDIOGRAM (TEE) (N/A) Subjective: Doing well with stable hemodynamics off pressors We will DC A-line Mixed venous saturation normal Chest x-ray clear with remaining right pleural chest tube output to 250 cc/day serous fluid Surgical incisions dry and clean, white count remains at 19,000.  Mediastinal fluid cultures remain negative. Chest x-ray remains clear Objective: Vital signs in last 24 hours: Temp:  [97.6 F (36.4 C)-99.1 F (37.3 C)] 98.3 F (36.8 C) (06/14 0854) Pulse Rate:  [51-107] 107 (06/14 0600) Cardiac Rhythm: Normal sinus rhythm (06/14 0400) Resp:  [18-39] 23 (06/14 0600) BP: (93-161)/(60-101) 151/80 (06/14 0600) SpO2:  [89 %-100 %] 97 % (06/14 0600) Weight:  [87.3 kg] 87.3 kg (06/14 0600)  Hemodynamic parameters for last 24 hours: CVP:  [9 mmHg-10 mmHg] 9 mmHg  Intake/Output from previous day: 06/13 0701 - 06/14 0700 In: 4553.3 [P.O.:1440; I.V.:3113.3] Out: 6595 [Urine:6285; Chest Tube:310] Intake/Output this shift: Total I/O In: 310.8 [I.V.:310.8] Out: 1350 [Urine:1000; Emesis/NG output:350]       Exam    General- alert and comfortable    Neck- no JVD, no cervical adenopathy palpable, no carotid bruit   Lungs- clear without rales, wheezes   Cor- regular rate and rhythm, no murmur , gallop   Abdomen- soft, non-tender   Extremities - warm, non-tender, minimal edema   Neuro- oriented, appropriate, no focal weakness   Lab Results: Recent Labs    11/05/19 0400 11/06/19 0423  WBC 20.9* 19.2*  HGB 10.6* 12.7*  HCT 32.4* 39.9  PLT 431* 468*   BMET:  Recent Labs    11/05/19 1702 11/06/19 0423  NA 140 138  K 3.8 4.1  CL 106 105  CO2 22 20*  GLUCOSE 112* 107*  BUN 16 16  CREATININE 0.71 0.80  CALCIUM 8.1* 8.4*    PT/INR:  Recent Labs    11/06/19 0423  LABPROT 13.6  INR 1.1   ABG    Component Value Date/Time   PHART  7.545 (H) 11/04/2019 0501   HCO3 27.7 11/04/2019 0501   TCO2 29 11/04/2019 0501   ACIDBASEDEF 1.0 10/31/2019 0644   O2SAT 71.6 11/04/2019 1747   CBG (last 3)  Recent Labs    11/06/19 0000 11/06/19 0414 11/06/19 0846  GLUCAP 119* 103* 114*    Assessment/Plan: S/P Procedure(s) (LRB): REMOVAL OF IMPELLA 5.5 LEFT VENTRICULAR ASSIST DEVICE (N/A) TRANSESOPHAGEAL ECHOCARDIOGRAM (TEE) (N/A) Mobilize Diuresis d/c tubes/lines  heparin dose  per pharm D until INR starts to react to Coumadin load   LOS: 12 days    William Crawford 11/06/2019

## 2019-11-06 NOTE — Progress Notes (Addendum)
Patient ID: HARVEST STANCO, male   DOB: [birth year], 49 y.o.   MRN: [id number]     Advanced Heart Failure Rounding Note  PCP-Cardiologist: No primary care provider on file.   Subjective:    - LAD repair + VA ECMO cannulation in OR 6/3. Chest wall open. - 6/7: ECMO decannulation, Impella 5.5 placement  - 6/11: Impella 5.5 removal.    NE off. Remains on milrinone 0.375. No co-ox due to clotted PICC. Co-ox on 6/12 was 72%  Remains on TPN but now advancing diet.   Excellent UOP yesterday, -6.3L out. Wt down 13 lb. SCr normal. K 4.1. CVP 9   BP remains elevated, 564P systolic.   OOB, sitting up in chair. Eating breakfast. Has good appetite. Continues to complain of dyspnea. On [place] 6L/min.   Significant bump in HFTs,  AST 62>>333 ALT 76>>327 Alk Phos 109>>211   - Echo (6/3): EF < 20%, severe RV dysfunction - Echo (6/6): EF 20-25%, normal RV function - Echo (6/8): EF 25-30%, normal RV - Echo (6/10): EF 40%, normal RV    Objective:   Weight Range: 87.3 kg Body mass index is 28.84 kg/m.   Vital Signs:   Temp:  [97.6 F (36.4 C)-99.1 F (37.3 C)] 98.5 F (36.9 C) (06/14 0400) Pulse Rate:  [51-107] 107 (06/14 0600) Resp:  [18-39] 23 (06/14 0600) BP: (93-161)/(60-101) 151/80 (06/14 0600) SpO2:  [89 %-100 %] 97 % (06/14 0600) Weight:  [87.3 kg] 87.3 kg (06/14 0600) Last BM Date: 11/05/19  Weight change: Filed Weights   11/03/19 0555 11/05/19 0600 11/06/19 0600  Weight: 93.1 kg 93.3 kg 87.3 kg    Intake/Output:   Intake/Output Summary (Last 24 hours) at 11/06/2019 0734 Last data filed at 11/06/2019 0700 Gross per 24 hour  Intake 4553.25 ml  Output 6595 ml  Net -2041.75 ml      Physical Exam    CVP 9  General:  OOB, sitting up in chair  No resp difficulty HEENT: normal Neck: supple. JVD ~7-8 cm. Carotids 2+ bilat; no bruits. No lymphadenopathy or thryomegaly appreciated. Cor: PMI nondisplaced. Regular rate & rhythm. No rubs, gallops or murmurs. Lungs:  decreased BS at the bases bilaterally, + CT Abdomen: obese soft, nontender, nondistended. No hepatosplenomegaly. No bruits or masses. Good bowel sounds. Extremities: no cyanosis, clubbing, rash, trace bilateral LE edema Neuro: alert & orientedx3, cranial nerves grossly intact. moves all 4 extremities w/o difficulty. Affect pleasant   Telemetry   NSR 90s Personally reviewed  Labs    CBC Recent Labs    11/05/19 0400 11/06/19 0423  WBC 20.9* 19.2*  NEUTROABS  --  13.4*  HGB 10.6* 12.7*  HCT 32.4* 39.9  MCV 95.6 91.1  PLT 431* 329*   Basic Metabolic Panel Recent Labs    11/05/19 0532 11/05/19 0532 11/05/19 1702 11/06/19 0423  NA 136   < > 140 138  K 3.5   < > 3.8 4.1  CL 101   < > 106 105  CO2 22   < > 22 20*  GLUCOSE 126*   < > 112* 107*  BUN 13   < > 16 16  CREATININE 0.77   < > 0.71 0.80  CALCIUM 8.0*   < > 8.1* 8.4*  MG 2.3  --   --  2.3  PHOS 3.1  --   --  4.0   < > = values in this interval not displayed.   Liver Function Tests Recent Labs  11/05/19 0400 11/06/19 0423  AST 62* 333*  ALT 76* 327*  ALKPHOS 109 211*  BILITOT 0.6 0.7  PROT 5.2* 6.7  ALBUMIN 2.0* 2.7*   No results for input(s): LIPASE, AMYLASE in the last 72 hours. Cardiac Enzymes No results for input(s): CKTOTAL, CKMB, CKMBINDEX, TROPONINI in the last 72 hours.  BNP: BNP (last 3 results) Recent Labs    10/17/19 0605  BNP 608.8*    ProBNP (last 3 results) No results for input(s): PROBNP in the last 8760 hours.   D-Dimer No results for input(s): DDIMER in the last 72 hours. Hemoglobin A1C No results for input(s): HGBA1C in the last 72 hours. Fasting Lipid Panel Recent Labs    11/06/19 0423  TRIG 123   Thyroid Function Tests No results for input(s): TSH, T4TOTAL, T3FREE, THYROIDAB in the last 72 hours.  Invalid input(s): FREET3  Other results:   Imaging    No results found.   Medications:     Scheduled Medications: . aspirin  81 mg Per Tube Daily  .  atorvastatin  40 mg Per Tube Daily  . chlorhexidine gluconate (MEDLINE KIT)  15 mL Mouth Rinse BID  . Chlorhexidine Gluconate Cloth  6 each Topical Daily  . clopidogrel  75 mg Oral Daily  . docusate  200 mg Per Tube Daily  . folic acid  1 mg Intravenous Daily  . furosemide  80 mg Intravenous BID  . insulin aspart  0-9 Units Subcutaneous Q4H  . ketotifen  1 drop Both Eyes BID  . mouth rinse  15 mL Mouth Rinse BID  . neostigmine  0.25 mg Subcutaneous Q6H  . pantoprazole (PROTONIX) IV  40 mg Intravenous QHS  . sacubitril-valsartan  1 tablet Oral BID  . sodium chloride flush  10-40 mL Intracatheter Q12H  . spironolactone  12.5 mg Oral Daily  . thiamine injection  100 mg Intravenous Daily  . Warfarin - Physician Dosing Inpatient   Does not apply q1600    Infusions: . sodium chloride Stopped (10/28/19 0227)  . sodium chloride Stopped (11/03/19 1425)  . amiodarone 30 mg/hr (11/06/19 0700)  . dexmedetomidine (PRECEDEX) IV infusion Stopped (11/04/19 0857)  . feeding supplement (VITAL 1.5 CAL) Stopped (11/02/19 1700)  . heparin Stopped (11/05/19 1306)  . milrinone 0.375 mcg/kg/min (11/06/19 0700)  . niCARDipine 8 mg/hr (11/05/19 1400)  . TPN ADULT (ION) 80 mL/hr at 11/06/19 0700    PRN Medications: sodium chloride, benzonatate, bisacodyl **OR** bisacodyl, hydrALAZINE, HYDROmorphone (DILAUDID) injection, ketorolac, metoprolol tartrate, midazolam, ondansetron (ZOFRAN) IV, oxyCODONE, sodium chloride flush, sodium chloride flush   Assessment/Plan   1. Cardiogenic shock: Pre-OR echo with EF 45%, apical hypokinesis.  TEE in OR after VF arrest showed severe biventricular dysfunction. Placed on ECMO. Repeat echo 6/6 with EF 20-25%, RV function improved/near normal. Decannulated to Impella 5.5 on 6/7. Impella 5.5 removed in OR 6/11 - Off NE. Co-ox 6/12 was good. Reduce milrinone to 0.25 today  - Volume status improving but weight still up 8 pounds above preop  Wt. CVP 9. Continue IV lasix 80 mg  bid today. - Continue Entresto 97-103 bid  - Increase spiro to 25 mg daily  2. Pericardial tamponade: Due to LAD perforation.  Now resolved in OR.  3. CAD: S/p DES to distal RCA on 5/25.  Has known CTO OM.  Had attempted intervention on CTO LAD that was complicated by LAD perforation, now s/p LAD repair.  The LAD was not graftable, LAD territory therefore is not perfused except by  pre-existing collaterals. - Has recent RCA stent. Now taking pos. Off cangrelor and transitioned back to Plavix. - Continue DAPT w/ ASA + Plavix  - Continue statin, atorvastatin 40 daily.   4. ETOH abuse: Watch for withdrawal.   5. Smoking: Will need to quit.  6. Acute hypoxemic respiratory failure: improving - extubated.   - On  7. Neuro: Nonfocal 8. GI: Suspect ileus. - improving. Had BM yesterday  - Advancing diet.  - stop TPN today  - Prealbumin trending up 9. VF arrest: In OR.  Occasional PVCs.  - continue amiodarone gtt, while on milrinone  - Off lidocaine.  10. ID: Tm 100.0, CXR with right effusion.  - Completed vancomycin/meropenem.  - CX remain negative - WBC trending down and AF  11. RA thrombus: Discussed with Dr. Prescott Gum by phone after device removal, there is some thrombus noted in RA on TEE in OR.   - Continue low dose heparin gtt today. Conitnue warfarin. - INR 1.1 today. Dosing per pharmacy.  12. Severe protein-calorie malnutrition - prealbumin trending up 10>>17.  - advancing diet - stop TPN today  13. Abnormal HFTs - Significant bump in HFTs,  AST 62>>333 ALT 76>>327 Alk Phos 109>>211 - Hold statin tonight - repeat HFTs in AM   Length of Stay: [address] PA-C  11/06/2019, 7:34 AM  Advanced Heart Failure Team Pager [phone] (M-F; 7a - 4p)  Please contact [facility] for night-coverage after hours (4p -7a ) and weekends on [email or web address]  Patient seen and examined with the above-signed Advanced Practice Provider and/or Housestaff. I personally reviewed  laboratory data, imaging studies and relevant notes. I independently examined the patient and formulated the important aspects of the plan. I have edited the note to reflect any of my changes or salient points. I have personally discussed the plan with the patient and/or family.  Remains on milrinone 0.375. Unable to get co-ox. Volume status still up. WBC up. Afebrile. Incisions ok. Remains on TPN. Back on Plavix. On heparin for RA clot. Loading warfarin. Now tolerating po diet.   General:  Sitting in chair. No resp difficulty HEENT: normal + [place] Neck: supple. no JVD. Carotids 2+ bilat; no bruits. No lymphadenopathy or thryomegaly appreciated. Cor: wounds ok PMI nondisplaced. Regular rate & rhythm. No rubs, gallops or murmurs. Lungs: clear decreased in bases Abdomen: soft, nontender, nondistended. No hepatosplenomegaly. No bruits or masses. Good bowel sounds. Extremities: no cyanosis, clubbing, rash, tr edema Neuro: alert & orientedx3, cranial nerves grossly intact. moves all 4 extremities w/o difficulty. Affect pleasant  Improving but remains tenuous. Decrease milrinone to 0.25. Continue IV diuresis one more day. Stop TPN. Ambulate. Continue IS.   Glori Bickers, MD  5:14 PM

## 2019-11-06 NOTE — Progress Notes (Signed)
TCTS Evening Rounds  Stable day BP 120/85   Pulse (!) 115   Temp 98.2 F (36.8 C) (Oral)   Resp 20   Ht 5' 8.5" (1.74 m)   Wt 87.3 kg   SpO2 91%   BMI 28.84 kg/m     Intake/Output Summary (Last 24 hours) at 11/06/2019 1623 Last data filed at 11/06/2019 1300 Gross per 24 hour  Intake 2925.11 ml  Output 5356 ml  Net -2430.89 ml    Exam unremarkable; wound c/d/i  A/P: continue management plan. Taye Cato Z. Orvan Seen, [place]

## 2019-11-06 NOTE — Evaluation (Addendum)
Occupational Therapy Evaluation Patient Details Name: William Crawford MRN: [id number] DOB: 1971/02/26 Today's Date: 11/06/2019    History of Present Illness Pt is 49 yo male who presented with chest pressure, NSTEMI, underwent DES to distal RCA on 5/25. Had attempted intervention on CTO LAD that was complicated by LAD perforation with VF arrest. TEE showed severe biventricular dysfunction. Placed on ECMO, decannulated to impella 6/7, impella removed 6/11.In addition, suspected ileus. PMH: tobacco and EtOH abuse, GERD.    Clinical Impression   This 49 y/o male presents with the above. PTA pt reports being very independent with ADL and mobility. Pt presents seated in recliner very pleasant and motivated to work with therapy. Overall pt requiring minA for functional transers, minA for marching in place (x2 bouts with standing rest in between), using RW for light UE support. He currently requires up to maxA for LB ADL. Continued review of sternal precautions provided during session with cues required throughout to ensure carryover. VSS throughout on 6L HFNC, BP end of session 135/86. Pt overall with limitations including decreased activity tolerance, impaired balance and overall weakness. He reports plans to transition to his parents house at time of discharge. Pt will benefit from continued acute OT services and currently feel he is an excellent candidate for CIR level therapies at time of discharge to maximize his overall safety and independence with ADL and mobility.     Follow Up Recommendations  CIR;Supervision/Assistance - 24 hour    Equipment Recommendations  Other (comment);Tub/shower seat (TBD)           Precautions / Restrictions Precautions Precautions: Sternal Precaution Booklet Issued: No Precaution Comments: reviewed sternal precautions, pt needing reminders and cueing Restrictions Other Position/Activity Restrictions: sternal precautions, R chest tube      Mobility Bed  Mobility               General bed mobility comments: received OOB in recliner, pt remaining in recliner end of session  Transfers Overall transfer level: Needs assistance Equipment used: None;1 person hand held assist Transfers: Sit to/from Stand Sit to Stand: Min assist;Mod assist         General transfer comment: boosting and steadying assist initially with standing, cues for UE placement to ensure maintaining sternal precautions. completed x2 from recliner    Balance Overall balance assessment: Needs assistance Sitting-balance support: No upper extremity supported;Feet supported Sitting balance-Leahy Scale: Fair     Standing balance support: Bilateral upper extremity supported;During functional activity Standing balance-Leahy Scale: Poor                             ADL either performed or assessed with clinical judgement   ADL Overall ADL's : Needs assistance/impaired Eating/Feeding: NPO   Grooming: Wash/dry face;Set up;Supervision/safety;Sitting   Upper Body Bathing: Minimal assistance;Sitting   Lower Body Bathing: Moderate assistance;Sit to/from stand   Upper Body Dressing : Minimal assistance;Sitting   Lower Body Dressing: Moderate assistance;Maximal assistance;Sit to/from stand               Functional mobility during ADLs: Minimal assistance General ADL Comments: pt completing x2 sit<>stands from recliner, placed RW in front of pt for second stand for light UE support (educated/cued not to place increased weight through UEs), pt able to take x3-4 steps in place (x2 reps) with standing rest in between  Pertinent Vitals/Pain Pain Assessment: Faces Faces Pain Scale: No hurt Pain Location: chest, point of urinary catheter insertion Pain Descriptors / Indicators: Sore;Discomfort Pain Intervention(s): Monitored during session;Limited activity within patient's tolerance     Hand Dominance Right    Extremity/Trunk Assessment Upper Extremity Assessment Upper Extremity Assessment: Generalized weakness (assessed within permissible range of sternal precautions)   Lower Extremity Assessment Lower Extremity Assessment: Defer to PT evaluation   Cervical / Trunk Assessment Cervical / Trunk Assessment: Normal   Communication Communication Communication: Other (comment) (dyspniec)   Cognition Arousal/Alertness: Awake/alert Behavior During Therapy: WFL for tasks assessed/performed;Impulsive Overall Cognitive Status: Impaired/Different from baseline Area of Impairment: Memory;Awareness;Attention                   Current Attention Level: Selective Memory: Decreased recall of precautions     Awareness: Emergent   General Comments: following commands well - eager to mobilize which creates some impulsivity - needing frequent cues to wait for therapist prior to standing attempts, pt very pleasant however chatty and required cues to focus on breathing (vs talking) due to dyspnea    General Comments       Exercises     Shoulder Instructions      Home Living Family/patient expects to be discharged to:: Private residence Living Arrangements: Parent Available Help at Discharge: Family;Available 24 hours/day Type of Home: House Home Access: Stairs to enter [employer] of Steps: 3   Home Layout: One level     Bathroom Shower/Tub: Tub/shower unit;Walk-in shower   Bathroom Toilet: Standard     Home Equipment: None   Additional Comments: pt was not living with his parents PTA but reports that he will go to their house at d/c      Prior Functioning/Environment Level of Independence: Independent        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, 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         OT Problem List: Decreased strength;Decreased range of motion;Decreased activity tolerance;Impaired balance (sitting and/or standing);Decreased cognition;Decreased knowledge of precautions;Decreased  knowledge of use of DME or AE;Cardiopulmonary status limiting activity      OT Treatment/Interventions: Self-care/ADL training;Therapeutic exercise;Energy conservation;DME and/or AE instruction;Therapeutic activities;Cognitive remediation/compensation;Patient/family education;Balance training    OT Goals(Current goals can be found in the care plan section) Acute Rehab OT Goals Patient Stated Goal: get better - wants to go home OT Goal Formulation: With patient Time For Goal Achievement: 11/20/19 Potential to Achieve Goals: Good  OT Frequency: Min 2X/week   Barriers to D/C:            Co-evaluation              AM-PAC OT "6 Clicks" Daily Activity     Outcome Measure Help from another person eating meals?: None Help from another person taking care of personal grooming?: A Little Help from another person toileting, which includes using toliet, bedpan, or urinal?: A Lot Help from another person bathing (including washing, rinsing, drying)?: A Lot Help from another person to put on and taking off regular upper body clothing?: A Little Help from another person to put on and taking off regular lower body clothing?: A Lot 6 Click Score: 16   End of Session Equipment Utilized During Treatment: Oxygen;Rolling walker Nurse Communication: Mobility status  Activity Tolerance: Patient tolerated treatment well Patient left: in chair;with call bell/phone within reach;with nursing/sitter in room  OT Visit Diagnosis: Muscle weakness (generalized) (M62.81);Unsteadiness on feet (R26.81);Other (comment) (decreased activity tolerance )  Time: 3007-6226 OT Time Calculation (min): 28 min Charges:  OT General Charges $OT Visit: 1 Visit OT Evaluation $OT Eval Moderate Complexity: 1 Mod OT Treatments $Self Care/Home Management : 8-22 mins  Lou Cal, OT Acute Rehabilitation Services Pager [phone] Office [phone]   William Crawford 11/06/2019, 2:19 PM

## 2019-11-06 NOTE — Progress Notes (Signed)
  Speech Language Pathology Treatment: Dysphagia  Patient Details Name: William Crawford MRN: [id number] DOB: August 18, 1970 Today's Date: 11/06/2019 Time: 9150-5697 SLP Time Calculation (min) (ACUTE ONLY): 16 min  Assessment / Plan / Recommendation Clinical Impression  F/u after 6/12 clinical swallow evaluation.  Pt's diet subsequently advanced to heart healthy, thin liquids.  Pt observed with portion of lunch meal today - he is cautious when eating, monitoring size of boluses and rate of consumption.  He demonstrates occasional throat-clearing throughout; voice remains clear; there were no incidents of coughing after three oz liquid. No significant changes in CXR since initiation of oral diet; BS are clear today.  Appears to be tolerating PO diet - will f/u x1 more to ensure safety/toleration, education. Pt verbalizes agreement.   HPI HPI: Patient is a 49 y.o. male with PMH: ETOH abuse, CAD s/p DES placement 5/25, tobacco use,  who was admitted on 6/2 following CTO PCI to LAD during which he developed a perforated LAD and went into vfib arrest. He then underwent emergent repair but was unable to be removed from bypass following procedure and PCCM was consulted for ECMO. He was initiated on [place] ECMO and decannulated to impella on 6/7. Impella removed 6/11. He was intubated 6/2 and extubated in AM on 6/11.      SLP Plan  Continue with current plan of care       Recommendations  Diet recommendations: Regular;Thin liquid Liquids provided via: Cup;Straw Medication Administration: Whole meds with puree Supervision: Patient able to self feed Compensations: Slow rate;Small sips/bites;Minimize environmental distractions                Oral Care Recommendations: Oral care BID Follow up Recommendations: Other (comment) (tbd) SLP Visit Diagnosis: Dysphagia, unspecified (R13.10) Plan: Continue with current plan of care       GO                Assunta Curtis 11/06/2019, 11:33  AM  Estill Bamberg L. Tivis Ringer, [place] Office number [phone] Pager [phone]

## 2019-11-06 NOTE — Progress Notes (Addendum)
[place] for IV Heparin Indication: ECMO > impella > R atrial thrombus  No Known Allergies  Patient Measurements: Height: 5' 8.5" (174 cm) Weight: 87.3 kg (192 lb 7.4 oz) IBW/kg (Calculated) : 69.55 Heparin Dosing Weight: 83.9 kg  Vital Signs: Temp: 98.5 F (36.9 C) (06/14 0400) Temp Source: Oral (06/14 0400) BP: 151/80 (06/14 0600) Pulse Rate: 107 (06/14 0600)  Labs: Recent Labs    11/04/19 0529 11/04/19 0531 11/04/19 0532 11/04/19 1756 11/04/19 1756 11/05/19 0400 11/05/19 0400 11/05/19 0532 11/05/19 1702 11/06/19 0423  HGB   < >  --   --  10.4*   < > 10.6*  --   --   --  12.7*  HCT   < >  --   --  31.5*  --  32.4*  --   --   --  39.9  PLT   < >  --   --  371  --  431*  --   --   --  468*  LABPROT  --  13.4  --   --   --  14.5  --   --   --  13.6  INR  --  1.1  --   --   --  1.2  --   --   --  1.1  HEPARINUNFRC  --   --  <0.10*  --   --  <0.10*  --   --   --  <0.10*  CREATININE   < > 0.73  --  0.76   < > 0.68   < > 0.77 0.71 0.80   < > = values in this interval not displayed.    Estimated Creatinine Clearance: 123.8 mL/min (by C-G formula based on SCr of 0.8 mg/dL).   Medical History: Past Medical History:  Diagnosis Date  . Alcohol abuse   . GERD (gastroesophageal reflux disease)   . Tobacco abuse     Assessment: 49 year old male initiated on [place] ECMO> decannulated to impella 6/7.  Impella removed 6/11.  In OR, found RA thrombus noted on TEE.  Fixed rate heparin was increased to 800 units/hr on 6/13 with undetectable level - IV filtrated yesterday, cangrelor has been transitioned to plavix and heparin is currently running at 800 units/hr. He had been therapeutic previously on 1600 units/hr while on ECMO.   Hgb 12.7, plt 468. LDH increased up to 744 today. INR is 1.1 - warfarin was started on 6/12 per MD, pharmacy will now be managing.    Goal of Therapy:  Eventually will target low heparin level goals, but for  now, will keep fixed dose rate and monitor bleeding. INR: 2-3 Monitor platelets by anticoagulation protocol: Yes   Plan:  Discussed with Dr Prescott Gum, will increase heparin infusion to 1000 units/hr - no titration Will order warfarin 5 mg tonight  Check heparin level and CBC daily  Monitor for s/sx bleeding  Antonietta Jewel, PharmD, [place] Pharmacist  Phone: [phone] 11/06/2019 7:39 AM  Please check AMION for all [place] phone numbers After 10:00 PM, call [place] [phone]

## 2019-11-06 NOTE — Progress Notes (Signed)
PHARMACY - TOTAL PARENTERAL NUTRITION CONSULT NOTE   Indication: prolonged ileus; intolerance to enteral nutrition   Patient Measurements: Height: 5' 8.5" (174 cm) Weight: 87.3 kg (192 lb 7.4 oz) IBW/kg (Calculated) : 69.55 TPN AdjBW (KG): 83.9 Body mass index is 28.84 kg/m. Usual Weight: Baseline wt unknown; 83 kg on admission  Assessment: 49 yo male presented on 10/25/2019. Patient underwent PCI to LAD and developed perforated LAD and went into VFib arrest and emergent repair. Patient was started on ECMO on 6/2 and decannulated on 6/7 with placement of Impella. Patient has had inadequate nutrition for 8 days. Cortrak was placed on 6/8 and patient has been unable to advance from trickle feeds with high residuals (350-600 mLs). Concern of SBO - no bowel sounds and contrast did not advance on imaging. Pharmacy consulted to start TPN. Of note, EF 30-35%, now improved to 40-45% and receiving IV diuresis. S/p removal of Impella 6/11 and extubated.  Patient reported eating ~50% of food provided. He reports being hungry with no abdominal pain or nausea. He ate all eggs provided with breakfast and was planning on finishing the fruit as well. Yesterday he reported eating a very large fruit salad.   Glucose / Insulin: No hx of DM. A1c 5.2. CBGs controlled. On sSSI q4h - utilized 1 units/24 hrs   Electrolytes: Phos 4.0. Corrected Ca 9.44. Other lytes wnl.  Renal: Scr stable wnl - at baseline LFTs / TGs: AST/ALT 333/327. Tbili WNL. TG 123.  Prealbumin / albumin: Albumin 2.7. Prealbumin 16.9 - increased from 10.2.  Intake / Output; MIVF: no MIVF. Positive 3.9L and undergoing diuresis. UOP 3 ml/kg/hr. Chest tubes 340m/24 hr output.  GI Imaging: - 6/8 x-ray: soft feeding tube could not be passed through pylorus  Surgeries / Procedures:  - 6/2: LAD perforation during DES, VFib arrest, emergent repair, ECMO - 6/7: ECMO decannulation, Impella - 6/11: impella removed, extubated Central access: PICC TPN  start date: 6/10  Nutritional Goals (per RD recommendations on 6/10): kCal: 2100-2500, Protein: 126-151, Fluid: >2L Goal TPN rate is 80 mL/hr (provides 140 g of protein and 2191 kcals per day)  Current Nutrition:  Advance to cardiac diet 6/13 TPN  Plan:  Discontinue TPN at 1800 today per discussion with Dr. VPrescott Gumand HF team given patient with appropriate oral intake Decreasing rate of TPN to 40 ml/hr at 1600 and discontinue infusion at 1800 Continue sensitive SSI q4h    GCristela Felt PharmD PGY1 Pharmacy Resident [employer]: [phone] 11/06/2019 7:04 AM

## 2019-11-06 NOTE — Progress Notes (Signed)
Nutrition Follow-up  DOCUMENTATION CODES:   Not applicable  INTERVENTION:   Ensure Enlive po TID, each supplement provides 350 kcal and 20 grams of protein  30 ml Prostat BID, each supplement provides 100 kcals and 15 grams protein.    NUTRITION DIAGNOSIS:   Inadequate oral intake related to acute illness as evidenced by NPO status.  Being addressed via diet advancement, supplements  GOAL:   Patient will meet greater than or equal to 90% of their needs  Progressing  MONITOR:   Vent status, TF tolerance, Labs, Weight trends  REASON FOR ASSESSMENT:   Consult, Ventilator Enteral/tube feeding initiation and management  ASSESSMENT:   49 yo male admitted to ICU on 6/2 following CTO PCI to LAD and developed perforated LAD followed by Vfib arrest requiring emergent repair and VA ECMO cannulation, transferred to ICU on vent support. PMH includes CAD, EtOH abuse, HTN, ICM, CHF  6/02 LAD perforation during DES placement followed by Vfib arrest, requiring emergent repair and VA ECMO cannulation 6/07 ECMO decannulation, Impella 5.5 placement 6/08 10 fr tube placed by Radiology, gastric, unable to pass into small intestine 6/10 TPN initiated 6/13 Diet advanced to Heart Healthy 6/14 TPN discontinued  Pt reports appetite is improved. Pt ate well at breakfast. Pt reports things tasting "too sweet." Pt reports the fruit is super sweet and he had to dilute the sweet tea. Pt reports he ate all of his breakfast this AM including a boiled egg, fruit still on tray table and pt working on this. Reinforced importance of adequate nutrition with a focus on eating adequate protein. Reviewed sources of protein with patient, pt reports he likes eggs, most all meat/poultry, milk and yogurt.   TPN continues with plans to not reorder tonight  Pt still with Cortrak in place; recommend removal today  Current weight 87.3 kg; admit weight 83.9 kg.  UOP 6285 mL in 24 hours  Labs: reviewed Meds:  folic acid, lasix, ss novolog, KCl  Diet Order:   Diet Order            Diet Heart Room service appropriate? Yes; Fluid consistency: Thin  Diet effective now                 EDUCATION NEEDS:   Not appropriate for education at this time  Skin:  Skin Assessment: Skin Integrity Issues: Skin Integrity Issues:: Wound VAC Wound Vac: chest incision  Last BM:  6/14  Height:   Ht Readings from Last 1 Encounters:  10/25/19 5' 8.5" (1.74 m)    Weight:   Wt Readings from Last 1 Encounters:  11/06/19 87.3 kg    BMI:  Body mass index is 28.84 kg/m.  Estimated Nutritional Needs:   Kcal:  2100-2500 kcals  Protein:  126-151 g  Fluid:  >/= 2 L   Kerman Passey MS, RDN, LDN, CNSC Registered Dietitian III RD Pager Number and RD On-Call Pager Number Located in [place]

## 2019-11-07 ENCOUNTER — Inpatient Hospital Stay (HOSPITAL_COMMUNITY): Payer: BLUE CROSS/BLUE SHIELD

## 2019-11-07 LAB — COMPREHENSIVE METABOLIC PANEL
ALT: 317 U/L — ABNORMAL HIGH (ref 0–44)
AST: 145 U/L — ABNORMAL HIGH (ref 15–41)
Albumin: 2.6 g/dL — ABNORMAL LOW (ref 3.5–5.0)
Alkaline Phosphatase: 187 U/L — ABNORMAL HIGH (ref 38–126)
Anion gap: 13 (ref 5–15)
BUN: 15 mg/dL (ref 6–20)
CO2: 21 mmol/L — ABNORMAL LOW (ref 22–32)
Calcium: 8.6 mg/dL — ABNORMAL LOW (ref 8.9–10.3)
Chloride: 103 mmol/L (ref 98–111)
Creatinine, Ser: 0.79 mg/dL (ref 0.61–1.24)
GFR calc Af Amer: >60 mL/min (ref >60)
GFR calc non Af Amer: >60 mL/min (ref >60)
Glucose, Bld: 93 mg/dL (ref 70–99)
Potassium: 3.6 mmol/L (ref 3.5–5.1)
Sodium: 137 mmol/L (ref 135–145)
Total Bilirubin: 0.9 mg/dL (ref 0.3–1.2)
Total Protein: 6.4 g/dL — ABNORMAL LOW (ref 6.5–8.1)

## 2019-11-07 LAB — CBC
HCT: 36.9 % — ABNORMAL LOW (ref 39.0–52.0)
Hemoglobin: 12.1 g/dL — ABNORMAL LOW (ref 13.0–17.0)
MCH: 29.1 pg (ref 26.0–34.0)
MCHC: 32.8 g/dL (ref 30.0–36.0)
MCV: 88.7 fL (ref 80.0–100.0)
Platelets: 448 10*3/uL — ABNORMAL HIGH (ref 150–400)
RBC: 4.16 MIL/uL — ABNORMAL LOW (ref 4.22–5.81)
RDW: 14.6 % (ref 11.5–15.5)
WBC: 20.3 10*3/uL — ABNORMAL HIGH (ref 4.0–10.5)
nRBC: 0 % (ref 0.0–0.2)

## 2019-11-07 LAB — HEPARIN LEVEL (UNFRACTIONATED): Heparin Unfractionated: <0.1 IU/mL — ABNORMAL LOW (ref 0.30–0.70)

## 2019-11-07 LAB — GLUCOSE, CAPILLARY
Glucose-Capillary: 67 mg/dL — ABNORMAL LOW (ref 70–99)
Glucose-Capillary: 119 mg/dL — ABNORMAL HIGH (ref 70–99)
Glucose-Capillary: 87 mg/dL (ref 70–99)
Glucose-Capillary: 95 mg/dL (ref 70–99)
Glucose-Capillary: 142 mg/dL — ABNORMAL HIGH (ref 70–99)

## 2019-11-07 LAB — MAGNESIUM: Magnesium: 2.2 mg/dL (ref 1.7–2.4)

## 2019-11-07 LAB — TYPE AND SCREEN
ABO/RH(D): A POS
Antibody Screen: NEGATIVE

## 2019-11-07 LAB — PROTIME-INR
INR: 1.1 (ref 0.8–1.2)
Prothrombin Time: 13.9 seconds (ref 11.4–15.2)

## 2019-11-07 LAB — LACTATE DEHYDROGENASE: LDH: 422 U/L — ABNORMAL HIGH (ref 98–192)

## 2019-11-07 LAB — PHOSPHORUS: Phosphorus: 3.9 mg/dL (ref 2.5–4.6)

## 2019-11-07 MED ORDER — THIAMINE HCL 100 MG PO TABS
100.0000 mg | ORAL_TABLET | Freq: Every day | ORAL | Status: DC
  Administered 2019-11-07 – 2019-11-11 (×5): 100 mg via ORAL
  Filled 2019-11-07 (×5): qty 1

## 2019-11-07 MED ORDER — DEXTROSE 50 % IV SOLN
12.5000 g | INTRAVENOUS | Status: AC
  Administered 2019-11-07: 12.5 g via INTRAVENOUS

## 2019-11-07 MED ORDER — PANTOPRAZOLE SODIUM 40 MG PO TBEC
40.0000 mg | DELAYED_RELEASE_TABLET | Freq: Every day | ORAL | Status: DC
  Administered 2019-11-07 – 2019-11-10 (×4): 40 mg via ORAL
  Filled 2019-11-07 (×4): qty 1

## 2019-11-07 MED ORDER — MIDAZOLAM HCL 2 MG/2ML IJ SOLN
2.0000 mg | Freq: Once | INTRAMUSCULAR | Status: AC
  Administered 2019-11-07: 2 mg via INTRAMUSCULAR

## 2019-11-07 MED ORDER — FOLIC ACID 1 MG PO TABS
1.0000 mg | ORAL_TABLET | Freq: Every day | ORAL | Status: DC
  Administered 2019-11-07 – 2019-11-11 (×5): 1 mg via ORAL
  Filled 2019-11-07 (×5): qty 1

## 2019-11-07 MED ORDER — DOCUSATE SODIUM 100 MG PO CAPS
100.0000 mg | ORAL_CAPSULE | Freq: Every day | ORAL | Status: DC
  Administered 2019-11-07 – 2019-11-11 (×3): 100 mg via ORAL
  Filled 2019-11-07 (×2): qty 1

## 2019-11-07 MED ORDER — ATORVASTATIN CALCIUM 40 MG PO TABS
40.0000 mg | ORAL_TABLET | Freq: Every day | ORAL | Status: DC
  Administered 2019-11-07 – 2019-11-11 (×5): 40 mg via ORAL
  Filled 2019-11-07 (×4): qty 1
  Filled 2019-11-07: qty 4

## 2019-11-07 MED ORDER — DEXTROSE 50 % IV SOLN
INTRAVENOUS | Status: AC
  Filled 2019-11-07: qty 50

## 2019-11-07 MED ORDER — TORSEMIDE 20 MG PO TABS
40.0000 mg | ORAL_TABLET | Freq: Every day | ORAL | Status: DC
  Administered 2019-11-08 – 2019-11-11 (×4): 40 mg via ORAL
  Filled 2019-11-07 (×4): qty 2

## 2019-11-07 MED ORDER — POTASSIUM CHLORIDE CRYS ER 20 MEQ PO TBCR
20.0000 meq | EXTENDED_RELEASE_TABLET | ORAL | Status: AC
  Administered 2019-11-07 (×3): 20 meq via ORAL
  Filled 2019-11-07 (×3): qty 1

## 2019-11-07 MED ORDER — WARFARIN SODIUM 5 MG PO TABS
5.0000 mg | ORAL_TABLET | Freq: Once | ORAL | Status: AC
  Administered 2019-11-07: 5 mg via ORAL
  Filled 2019-11-07: qty 1

## 2019-11-07 MED FILL — Heparin Sodium (Porcine) Inj 1000 Unit/ML: INTRAMUSCULAR | Qty: 30 | Status: AC

## 2019-11-07 NOTE — Progress Notes (Signed)
4 Days Post-Op Procedure(s) (LRB): REMOVAL OF IMPELLA 5.5 LEFT VENTRICULAR ASSIST DEVICE (N/A) TRANSESOPHAGEAL ECHOCARDIOGRAM (TEE) (N/A) Subjective: Currently resting, nonverbal after episode of delirium last night with PICC line pulled.  Right chest tube drainage minimal and will removed today.  Patient on Plavix for recent RCA stent and Coumadin started per pharmacy for right atrial mural thrombus related to ECMO cannula with IV heparin bridge until INR approaches 2.0 Hemoglobin stable Pain in sinus rhythm  Objective: Vital signs in last 24 hours: Temp:  [97.4 F (36.3 C)-99.1 F (37.3 C)] 99.1 F (37.3 C) (06/15 0655) Pulse Rate:  [84-135] 97 (06/15 0800) Cardiac Rhythm: Normal sinus rhythm;Sinus tachycardia (06/15 0400) Resp:  [19-32] 25 (06/15 0800) BP: (120-179)/(73-104) 120/104 (06/15 0800) SpO2:  [56 %-99 %] 96 % (06/15 0800) Weight:  [84.3 kg] 84.3 kg (06/15 0500)  Hemodynamic parameters for last 24 hours:    Intake/Output from previous day: 06/14 0701 - 06/15 0700 In: 1236.7 [P.O.:240; I.V.:996.7] Out: 3276 [Urine:2925; Emesis/NG output:350; Stool:1] Intake/Output this shift: Total I/O In: 40 [P.O.:40] Out: 20 [Chest Tube:20]  Exam Sinus rhythm, no murmur Sternal incision clean and dry Impella incision clean and dry Minimal edema  Lab Results: Recent Labs    11/06/19 0423 11/07/19 0308  WBC 19.2* 20.3*  HGB 12.7* 12.1*  HCT 39.9 36.9*  PLT 468* 448*   BMET:  Recent Labs    11/06/19 0423 11/07/19 0308  NA 138 137  K 4.1 3.6  CL 105 103  CO2 20* 21*  GLUCOSE 107* 93  BUN 16 15  CREATININE 0.80 0.79  CALCIUM 8.4* 8.6*    PT/INR:  Recent Labs    11/07/19 0308  LABPROT 13.9  INR 1.1   ABG    Component Value Date/Time   PHART 7.545 (H) 11/04/2019 0501   HCO3 27.7 11/04/2019 0501   TCO2 29 11/04/2019 0501   ACIDBASEDEF 1.0 10/31/2019 0644   O2SAT 71.6 11/04/2019 1747   CBG (last 3)  Recent Labs    11/06/19 2126 11/07/19 0645  11/07/19 0730  GLUCAP 103* 67* 119*    Assessment/Plan: S/P Procedure(s) (LRB): REMOVAL OF IMPELLA 5.5 LEFT VENTRICULAR ASSIST DEVICE (N/A) TRANSESOPHAGEAL ECHOCARDIOGRAM (TEE) (N/A) Mobilize Diuresis DC OxyContin and observe mental status Patient's condition and plan of care discussed with mother at bedside  LOS: 13 days    [name] 11/07/2019

## 2019-11-07 NOTE — Progress Notes (Signed)
Physical Therapy Treatment Patient Details Name: William Crawford MRN: [id number] DOB: November 11, 1970 Today's Date: 11/07/2019    History of Present Illness Pt is 49 yo male who presented with chest pressure, NSTEMI, underwent DES to distal RCA on 5/25. Had attempted intervention on CTO LAD that was complicated by LAD perforation with VF arrest. TEE showed severe biventricular dysfunction. Placed on ECMO, decannulated to impella 6/7, impella removed 6/11.In addition, suspected ileus. PMH: tobacco and EtOH abuse, GERD.     PT Comments    Pt sitting EOB on arrival with nursing staff wanting to get to [facility] but very confused and not following commands. Pt receptive to this therapist for assist to [facility] maintaining sternal precautions. 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 Pt also very insistent that his DNA in his BM is worth millions in bitcoin and threatened to sue this therapist for flushing it. Pt able to be redirected at times and was able to walk in hall on 6L with unknown SpO2 as pulse ox on finger and toe not showing pleth with return to 97% at rest in room. Pt with significant cognitive deficits limiting mobility, safety and function at this time with D/C plan still appropriate. Mother and nurse tech/sitter present throughout session. Hr 99 with gait    Follow Up Recommendations  CIR     Equipment Recommendations  None recommended by PT    Recommendations for Other Services       Precautions / Restrictions Precautions Precautions: Sternal;Fall Precaution Comments: pt not able to recall precautions, intermittent combativeness per nursing    Mobility  Bed Mobility Overal bed mobility: Needs Assistance Bed Mobility: Sit to Supine       Sit to supine: Min guard   General bed mobility comments: pt sitting EOB with nursing on arrival. Returned to bed end of session with guarding for lines and  safety  Transfers Overall transfer level: Needs assistance   Transfers: Sit to/from Stand;Stand Pivot Transfers Sit to Stand: Min guard Stand pivot transfers: Min assist       General transfer comment: mod cues for hand placement and safety with ability to rise but min assist to steady with movement. pivot bed <>BSC with assist for pericare in standing.  Ambulation/Gait Ambulation/Gait assistance: Min assist;+2 safety/equipment Gait Distance (Feet): 120 Feet Assistive device: 1 person hand held assist;IV Pole Gait Pattern/deviations: Step-through pattern;Decreased stride length   Gait velocity interpretation: <1.8 ft/sec, indicate of risk for recurrent falls General Gait Details: pt with RUE HHA and IV pole at times in left hand. Pt with decreased stability with min assist to stabilize. Cues for direction and safety with pt maintaining his perseveration with his BM being flushed and believing it was worth millions in [employer] (Stroke Patients Only)       Balance Overall balance assessment: Needs assistance Sitting-balance support: No upper extremity supported;Feet supported Sitting balance-Leahy Scale: Fair     Standing balance support: Single extremity supported Standing balance-Leahy Scale: Fair                              Cognition Arousal/Alertness: Awake/alert Behavior During Therapy: Restless Overall Cognitive Status: Impaired/Different from baseline Area of Impairment: Memory;Awareness;Attention;Orientation;Problem solving;Following commands;Safety/judgement  Orientation Level: Disoriented to;Time;Place;Situation Current Attention Level: Sustained Memory: Decreased recall of precautions;Decreased short-term memory Following Commands: Follows one step commands inconsistently Safety/Judgement: Decreased awareness of deficits;Decreased awareness of  safety Awareness: Intellectual Problem Solving: Slow processing;Requires verbal cues General Comments: pt fixated on holding onto objects in room. Stating "if you get rid of that I'll sue you"(BM), "that has my DNA and is worth millions in bitcoin " (again referencing his BM)      Exercises      General Comments        Pertinent Vitals/Pain Pain Assessment: No/denies pain    Home Living                      Prior Function            PT Goals (current goals can now be found in the care plan section) Progress towards PT goals: Progressing toward goals    Frequency    Min 3X/week      PT Plan Current plan remains appropriate    Co-evaluation              AM-PAC PT "6 Clicks" Mobility   Outcome Measure  Help needed turning from your back to your side while in a flat bed without using bedrails?: A Little Help needed moving from lying on your back to sitting on the side of a flat bed without using bedrails?: A Little Help needed moving to and from a bed to a chair (including a wheelchair)?: A Little Help needed standing up from a chair using your arms (e.g., wheelchair or bedside chair)?: A Little Help needed to walk in hospital room?: A Little Help needed climbing 3-5 steps with a railing? : A Lot 6 Click Score: 17    End of Session Equipment Utilized During Treatment: Oxygen;Gait belt Activity Tolerance: Patient tolerated treatment well Patient left: in bed;with call bell/phone within reach;with family/visitor present;with nursing/sitter in room Nurse Communication: Mobility status;Precautions PT Visit Diagnosis: Unsteadiness on feet (R26.81);Muscle weakness (generalized) (M62.81);Pain;Difficulty in walking, not elsewhere classified (R26.2)     Time: 1040-1108 PT Time Calculation (min) (ACUTE ONLY): 28 min  Charges:  $Gait Training: 8-22 mins $Therapeutic Activity: 8-22 mins                     Tytiana Coles P, PT Acute Rehabilitation  Services Pager: [phone] Office: [place] 11/07/2019, 1:34 PM

## 2019-11-07 NOTE — Progress Notes (Signed)
Patient ID: William Crawford, male   DOB: 1970/08/02, 49 y.o.   MRN: [id number] TCTS Evening Rounds:  Hemodynamically stable on milrinone 0.125  sats 94% on 6L HFNC  Making urine.

## 2019-11-07 NOTE — Progress Notes (Signed)
When I walked into the pts room, the pt was standing at the bedside and voiding on the floor. The pt had already pulled out his PICC by this time. Pt was oriented x4 and following commands, but intermittently confused. A few minutes later, the pt began voicing verbal threats towards staff and threatening to hit staff. The pt was not following all commands, and became disoriented. IV team was consulted. Verbal order for 2 mg of versed IM was given. Safety sitter orders were placed. Mom was called and is now visiting.

## 2019-11-07 NOTE — Progress Notes (Signed)
Inpatient Rehab Admissions:  Inpatient Rehab Consult received.  Per RN, pt still very confused.  I spoke with his mother over the phone for rehabilitation assessment and to discuss goals and expectations of an inpatient rehab admission.  At this time, she feels if CIR is recommended they are open to it.  Pt not medically ready and will need prior authorization from insurance, so will follow along for timing of prior auth request for potential CIR admit.   Signed: Shann Medal, PT, DPT Admissions Coordinator [phone] 11/07/19  1:51 PM

## 2019-11-07 NOTE — Progress Notes (Signed)
William Crawford for IV Heparin Indication: ECMO > impella > R atrial thrombus  No Known Allergies  Patient Measurements: Height: 5' 8.5" (174 cm) Weight: 84.3 kg (185 lb 13.6 oz) IBW/kg (Calculated) : 69.55 Heparin Dosing Weight: 83.9 kg  Vital Signs: Temp: 99.1 F (37.3 C) (06/15 0655) Temp Source: Axillary (06/15 0655) BP: 140/77 (06/15 0700) Pulse Rate: 84 (06/15 0600)  Labs: Recent Labs    11/05/19 0400 11/05/19 0400 11/05/19 0532 11/05/19 1702 11/06/19 0423 11/07/19 0308  HGB 10.6*   < >  --   --  12.7* 12.1*  HCT 32.4*  --   --   --  39.9 36.9*  PLT 431*  --   --   --  468* 448*  LABPROT 14.5  --   --   --  13.6 13.9  INR 1.2  --   --   --  1.1 1.1  HEPARINUNFRC <0.10*  --   --   --  <0.10* <0.10*  CREATININE 0.68  --    < > 0.71 0.80 0.79   < > = values in this interval not displayed.    Estimated Creatinine Clearance: 121.9 mL/min (by C-G formula based on SCr of 0.79 mg/dL).   Medical History: Past Medical History:  Diagnosis Date  . Alcohol abuse   . GERD (gastroesophageal reflux disease)   . Tobacco abuse     Assessment: 49 year old male initiated on [place] ECMO> decannulated to impella 6/7.  Impella removed 6/11.  In OR, found RA thrombus noted on TEE.  Fixed rate heparin was increased to 1000 units/hr on 6/14 - level remains undetectable. He had been therapeutic previously on 1600 units/hr while on ECMO. INR today remains 1.1 s/p 3 doses of warfarin.  Hgb 12.1, plt 448. LDH increased up to 744 yesterday - now decreased to 448.    Goal of Therapy:  Eventually will target low heparin level goals, but for now, will keep fixed dose rate and monitor bleeding. INR: 2-3 Monitor platelets by anticoagulation protocol: Yes   Plan:  Discussed with Dr Prescott Gum, will increase heparin infusion to 1000 units/hr - no titration Will order warfarin 5 mg tonight - might increase further tomorrow pending INR trend  Plan for bASA  for 30 days then plavix and warfarin thereafter Check heparin level and CBC daily  Monitor for s/sx bleeding  Antonietta Jewel, PharmD, [place] Pharmacist  Phone: [phone] 11/07/2019 7:20 AM  Please check AMION for all [place] phone numbers After 10:00 PM, call [place] [phone]

## 2019-11-07 NOTE — Progress Notes (Signed)
Patient ID: William Crawford, male   DOB: 1970/07/08, 49 y.o.   MRN: [id number]     Advanced Heart Failure Rounding Note  PCP-Cardiologist: No primary care provider on file.   Subjective:    - LAD repair + VA ECMO cannulation in OR 6/3. Chest wall open. - 6/7: ECMO decannulation, Impella 5.5 placement  - 6/11: Impella 5.5 removal.    NE off. Remains on milrinone 0.25. Off TPN  Agitated and confused overnight. Pulled PICC. Voided on floor and was beligerent with staff. Remains confused this am. Mother at bedside. Worried it is related to OxyContin he got yesterday at 3p.  Denies CP or SOB. Not able to tell me where he is at.    - Echo (6/3): EF < 20%, severe RV dysfunction - Echo (6/6): EF 20-25%, normal RV function - Echo (6/8): EF 25-30%, normal RV - Echo (6/10): EF 40%, normal RV    Objective:   Weight Range: 84.3 kg Body mass index is 27.85 kg/m.   Vital Signs:   Temp:  [97.4 F (36.3 C)-99.1 F (37.3 C)] 99.1 F (37.3 C) (06/15 0655) Pulse Rate:  [84-135] 84 (06/15 0600) Resp:  [19-32] 22 (06/15 0700) BP: (120-179)/(73-104) 140/77 (06/15 0700) SpO2:  [56 %-100 %] 94 % (06/15 0600) Weight:  [84.3 kg] 84.3 kg (06/15 0500) Last BM Date: 11/06/19  Weight change: Filed Weights   11/05/19 0600 11/06/19 0600 11/07/19 0500  Weight: 93.3 kg 87.3 kg 84.3 kg    Intake/Output:   Intake/Output Summary (Last 24 hours) at 11/07/2019 0809 Last data filed at 11/07/2019 0600 Gross per 24 hour  Intake 1130.75 ml  Output 3126 ml  Net -1995.25 ml      Physical Exam    General:  Sitting up in bed. Confused. Dishselved HEENT: normal Neck: supple. no JVD. Carotids 2+ bilat; no bruits. No lymphadenopathy or thryomegaly appreciated. Cor: Sternal wound looks good.  PMI nondisplaced. Regular rate & rhythm. No rubs, gallops or murmurs. Lungs: diffuse rhonchi Abdomen: soft, nontender, nondistended. No hepatosplenomegaly. No bruits or masses. Good bowel sounds. Extremities: no  cyanosis, clubbing, rash, edema Neuro: awake confused. Follows commands  cranial nerves grossly intact. moves all 4 extremities w/o difficulty.    Telemetry   NSR 80-90s Personally reviewed  Labs    CBC Recent Labs    11/06/19 0423 11/07/19 0308  WBC 19.2* 20.3*  NEUTROABS 13.4*  --   HGB 12.7* 12.1*  HCT 39.9 36.9*  MCV 91.1 88.7  PLT 468* 027*   Basic Metabolic Panel Recent Labs    11/06/19 0423 11/07/19 0308  NA 138 137  K 4.1 3.6  CL 105 103  CO2 20* 21*  GLUCOSE 107* 93  BUN 16 15  CREATININE 0.80 0.79  CALCIUM 8.4* 8.6*  MG 2.3 2.2  PHOS 4.0 3.9   Liver Function Tests Recent Labs    11/06/19 0423 11/07/19 0308  AST 333* 145*  ALT 327* 317*  ALKPHOS 211* 187*  BILITOT 0.7 0.9  PROT 6.7 6.4*  ALBUMIN 2.7* 2.6*   No results for input(s): LIPASE, AMYLASE in the last 72 hours. Cardiac Enzymes No results for input(s): CKTOTAL, CKMB, CKMBINDEX, TROPONINI in the last 72 hours.  BNP: BNP (last 3 results) Recent Labs    10/17/19 0605  BNP 608.8*    ProBNP (last 3 results) No results for input(s): PROBNP in the last 8760 hours.   D-Dimer No results for input(s): DDIMER in the last 72 hours. Hemoglobin A1C No  results for input(s): HGBA1C in the last 72 hours. Fasting Lipid Panel Recent Labs    11/06/19 0423  TRIG 123   Thyroid Function Tests No results for input(s): TSH, T4TOTAL, T3FREE, THYROIDAB in the last 72 hours.  Invalid input(s): FREET3  Other results:   Imaging    DG Chest Port 1 View  Result Date: 11/07/2019 CLINICAL DATA:  Chest tube EXAM: PORTABLE CHEST 1 VIEW COMPARISON:  11/06/2019 FINDINGS: Platelike scarring/atelectasis in the right lower. Right chest tube. No pneumothorax is seen. Left lung is clear. Heart is normal in size.  Midline skin staples.  Median sternotomy. Prior enteric tube and right arm PICC have been removed. IMPRESSION: Stable right chest tube.  No pneumothorax is seen. Electronically Signed   By:  Julian Hy M.D.   On: 11/07/2019 07:54     Medications:     Scheduled Medications: . amiodarone  400 mg Oral BID  . aspirin  81 mg Per Tube Daily  . atorvastatin  40 mg Per Tube Daily  . chlorhexidine gluconate (MEDLINE KIT)  15 mL Mouth Rinse BID  . Chlorhexidine Gluconate Cloth  6 each Topical Daily  . clopidogrel  75 mg Oral Daily  . dextrose      . docusate  200 mg Per Tube Daily  . feeding supplement (ENSURE ENLIVE)  237 mL Oral TID BM  . feeding supplement (PRO-STAT SUGAR FREE 64)  30 mL Oral BID  . folic acid  1 mg Intravenous Daily  . furosemide  80 mg Intravenous BID  . insulin aspart  0-9 Units Subcutaneous TID AC & HS  . ketotifen  1 drop Both Eyes BID  . mouth rinse  15 mL Mouth Rinse BID  . pantoprazole (PROTONIX) IV  40 mg Intravenous QHS  . potassium chloride  20 mEq Oral Q4H  . sacubitril-valsartan  1 tablet Oral BID  . sodium chloride flush  10-40 mL Intracatheter Q12H  . spironolactone  25 mg Oral Daily  . thiamine injection  100 mg Intravenous Daily  . Warfarin - Pharmacist Dosing Inpatient   Does not apply q1600    Infusions: . sodium chloride Stopped (10/28/19 0227)  . sodium chloride Stopped (11/03/19 1425)  . heparin 1,000 Units/hr (11/06/19 0810)  . milrinone Stopped (11/07/19 0447)    PRN Medications: sodium chloride, benzonatate, bisacodyl **OR** bisacodyl, hydrALAZINE, HYDROmorphone (DILAUDID) injection, ketorolac, metoprolol tartrate, midazolam, ondansetron (ZOFRAN) IV, sodium chloride flush, sodium chloride flush   Assessment/Plan   1. Cardiogenic shock: Pre-OR echo with EF 45%, apical hypokinesis.  TEE in OR after VF arrest showed severe biventricular dysfunction. Placed on ECMO. Repeat echo 6/6 with EF 20-25%, RV function improved/near normal. Decannulated to Impella 5.5 on 6/7. Impella 5.5 removed in OR 6/11 - Off NE. Milrinone at 0.25. No PICC to check co-ox. Hemodynamically stable. Decrease milrinone to 0.125 - Weight back to  baseline. Would stop lasix gtt. Start torsemide 40 po daily tomorrow - Continue Entresto 97-103 bid  - Continue spiro 25 mg daily  - no b-blocker yet 2. Pericardial tamponade: Due to LAD perforation.  Now resolved in OR.  3. CAD: S/p DES to distal RCA on 5/25.  Has known CTO OM.  Had attempted intervention on CTO LAD that was complicated by LAD perforation, now s/p LAD repair.  The LAD was not graftable, LAD territory therefore is not perfused except by pre-existing collaterals. - Has recent RCA stent. Now taking pos. Off cangrelor and transitioned back to Plavix. - Continue DAPT w/  ASA + Plavix  - Continue statin, atorvastatin 40 daily.   4. ETOH abuse: - likely out of the window of WD 5. Smoking: Will need to quit.  6. Acute hypoxemic respiratory failure: improving - extubated.   - On [place] 7. Neuro: Nonfocal 8. GI: Suspect ileus. - improved. Off TPN - Prealbumin trending up 9. VF arrest: In OR.  Occasional PVCs.  - continue amiodarone gtt, while on milrinone  - Off lidocaine.  10. ID:  - WBC stuck at 20K Tm 99.1 - No localizing features - Completed vancomycin/meropenem.  - CX remain negative - WBC trending down and AF  - Continue IS  11. RA thrombus: Discussed with Dr. Prescott Gum by phone after device removal, there is some thrombus noted in RA on TEE in OR.   - Continue low dose heparin gtt today. Conitnue warfarin. - INR 1.1 today. Discussed dosing with PharmD personally. 12. Severe protein-calorie malnutrition - prealbumin trending up 10>>17.  - advancing diet. Off TPN 13. Acute encephalopathy - suspect component of anoxic brain injury due to long arrest. Seems to wax and wane - late-presenting ETOH WD unlikely but not impossible - avoid narcotics - can start Zyprexa as needed.  - frequent cues  Keep in ICU today  Length of Stay: [place], MD  11/07/2019, 8:09 AM  Advanced Heart Failure Team Pager [phone] (M-F; 7a - 4p)  Please contact [facility]  for night-coverage after hours (4p -7a ) and weekends on [email or web address]

## 2019-11-08 ENCOUNTER — Inpatient Hospital Stay (HOSPITAL_COMMUNITY): Payer: BLUE CROSS/BLUE SHIELD

## 2019-11-08 LAB — COMPREHENSIVE METABOLIC PANEL
ALT: 199 U/L — ABNORMAL HIGH (ref 0–44)
AST: 64 U/L — ABNORMAL HIGH (ref 15–41)
Albumin: 2.6 g/dL — ABNORMAL LOW (ref 3.5–5.0)
Alkaline Phosphatase: 143 U/L — ABNORMAL HIGH (ref 38–126)
Anion gap: 11 (ref 5–15)
BUN: 16 mg/dL (ref 6–20)
CO2: 19 mmol/L — ABNORMAL LOW (ref 22–32)
Calcium: 8.5 mg/dL — ABNORMAL LOW (ref 8.9–10.3)
Chloride: 109 mmol/L (ref 98–111)
Creatinine, Ser: 0.9 mg/dL (ref 0.61–1.24)
GFR calc Af Amer: >60 mL/min (ref >60)
GFR calc non Af Amer: >60 mL/min (ref >60)
Glucose, Bld: 77 mg/dL (ref 70–99)
Potassium: 4.3 mmol/L (ref 3.5–5.1)
Sodium: 139 mmol/L (ref 135–145)
Total Bilirubin: 0.8 mg/dL (ref 0.3–1.2)
Total Protein: 6.3 g/dL — ABNORMAL LOW (ref 6.5–8.1)

## 2019-11-08 LAB — CBC
HCT: 37.6 % — ABNORMAL LOW (ref 39.0–52.0)
Hemoglobin: 11.7 g/dL — ABNORMAL LOW (ref 13.0–17.0)
MCH: 28.6 pg (ref 26.0–34.0)
MCHC: 31.1 g/dL (ref 30.0–36.0)
MCV: 91.9 fL (ref 80.0–100.0)
Platelets: 546 10*3/uL — ABNORMAL HIGH (ref 150–400)
RBC: 4.09 MIL/uL — ABNORMAL LOW (ref 4.22–5.81)
RDW: 14.6 % (ref 11.5–15.5)
WBC: 19.2 10*3/uL — ABNORMAL HIGH (ref 4.0–10.5)
nRBC: 0 % (ref 0.0–0.2)

## 2019-11-08 LAB — GLUCOSE, CAPILLARY
Glucose-Capillary: 77 mg/dL (ref 70–99)
Glucose-Capillary: 103 mg/dL — ABNORMAL HIGH (ref 70–99)
Glucose-Capillary: 56 mg/dL — ABNORMAL LOW (ref 70–99)
Glucose-Capillary: 82 mg/dL (ref 70–99)
Glucose-Capillary: 76 mg/dL (ref 70–99)

## 2019-11-08 LAB — LACTATE DEHYDROGENASE: LDH: 309 U/L — ABNORMAL HIGH (ref 98–192)

## 2019-11-08 LAB — HEPARIN LEVEL (UNFRACTIONATED): Heparin Unfractionated: <0.1 IU/mL — ABNORMAL LOW (ref 0.30–0.70)

## 2019-11-08 LAB — PROTIME-INR
INR: 2.2 — ABNORMAL HIGH (ref 0.8–1.2)
Prothrombin Time: 23.4 seconds — ABNORMAL HIGH (ref 11.4–15.2)

## 2019-11-08 MED ORDER — OLANZAPINE 2.5 MG PO TABS
2.5000 mg | ORAL_TABLET | Freq: Every day | ORAL | Status: DC
  Administered 2019-11-08 – 2019-11-10 (×3): 2.5 mg via ORAL
  Filled 2019-11-08 (×4): qty 1

## 2019-11-08 MED ORDER — CEPHALEXIN 500 MG PO CAPS
500.0000 mg | ORAL_CAPSULE | Freq: Four times a day (QID) | ORAL | Status: DC
  Administered 2019-11-08 – 2019-11-11 (×14): 500 mg via ORAL
  Filled 2019-11-08 (×14): qty 1

## 2019-11-08 MED ORDER — GERHARDT'S BUTT CREAM
TOPICAL_CREAM | CUTANEOUS | Status: DC | PRN
  Filled 2019-11-08: qty 1

## 2019-11-08 NOTE — Progress Notes (Addendum)
[place] for warfarin Indication: ECMO > impella > R atrial thrombus  No Known Allergies  Patient Measurements: Height: 5' 8.5" (174 cm) Weight: 84.3 kg (185 lb 13.6 oz) IBW/kg (Calculated) : 69.55 Heparin Dosing Weight: 83.9 kg  Vital Signs: Temp: 97.9 F (36.6 C) (06/16 0635) Temp Source: Oral (06/16 0635) BP: 116/74 (06/16 0700) Pulse Rate: 101 (06/16 0800)  Labs: Recent Labs    11/06/19 0423 11/06/19 0423 11/07/19 0308 11/08/19 0718 11/08/19 0729  HGB 12.7*   < > 12.1*  --  11.7*  HCT 39.9  --  36.9*  --  37.6*  PLT 468*  --  448*  --  546*  LABPROT 13.6  --  13.9 23.4*  --   INR 1.1  --  1.1 2.2*  --   HEPARINUNFRC <0.10*  --  <0.10* <0.10*  --   CREATININE 0.80  --  0.79 0.90  --    < > = values in this interval not displayed.    Estimated Creatinine Clearance: 108.4 mL/min (by C-G formula based on SCr of 0.9 mg/dL).   Medical History: Past Medical History:  Diagnosis Date  . Alcohol abuse   . GERD (gastroesophageal reflux disease)   . Tobacco abuse     Assessment: 49 year old male initiated on [place] ECMO> decannulated to impella 6/7.  Impella removed 6/11.  In OR, found RA thrombus noted on TEE.  Fixed rate heparin was increased to 1000 units/hr on 6/14 - level remains undetectable. INR today jumped from 1.1 to 2.2 s/p 4 doses of warfarin. Of note, LFTs had been elevated but trending down - likely contributing to INR increase.  Hgb 11.7, plt 546. LDH stable at 309.   Goal of Therapy:  Eventually will target low heparin level goals, but for now, will keep fixed dose rate and monitor bleeding. INR: 2-3 Monitor platelets by anticoagulation protocol: Yes   Plan:  Stop heparin Given large INR jump will hold warfarin tonight  Plan for bASA for 30 days then plavix and warfarin thereafter Check INR and CBC daily  Monitor for s/sx bleeding  Antonietta Jewel, PharmD, [place] Pharmacist  Phone:  [phone] 11/08/2019 8:18 AM  Please check AMION for all [place] phone numbers After 10:00 PM, call [place] [phone]

## 2019-11-08 NOTE — Progress Notes (Signed)
Patient ID: William Crawford, male   DOB: 01-04-1971, 49 y.o.   MRN: [id number]     Advanced Heart Failure Rounding Note  PCP-Cardiologist: No primary care provider on file.   Subjective:    - LAD repair + VA ECMO cannulation in OR 6/3. Chest wall open. - 6/7: ECMO decannulation, Impella 5.5 placement  - 6/11: Impella 5.5 removal.    Agitated and confused yesterday. Pulled PICC.   Less agitated today but still confused. On milrinone 0.125. No central access to measure CVP or co-ox.   Denies CP or SOB. [place] [employer] [place]   - Echo (6/3): EF < 20%, severe RV dysfunction - Echo (6/6): EF 20-25%, normal RV function - Echo (6/8): EF 25-30%, normal RV - Echo (6/10): EF 40%, normal RV    Objective:   Weight Range: 84.3 kg Body mass index is 27.85 kg/m.   Vital Signs:   Temp:  [97.6 F (36.4 C)-98.7 F (37.1 C)] 97.9 F (36.6 C) (06/16 0635) Pulse Rate:  [50-102] 101 (06/16 0800) Resp:  [16-31] 25 (06/16 0800) BP: (103-149)/(58-100) 116/74 (06/16 0700) SpO2:  [75 %-100 %] 94 % (06/16 0800) Last BM Date: 11/07/19  Weight change: Filed Weights   11/05/19 0600 11/06/19 0600 11/07/19 0500  Weight: 93.3 kg 87.3 kg 84.3 kg    Intake/Output:   Intake/Output Summary (Last 24 hours) at 11/08/2019 0818 Last data filed at 11/08/2019 0800 Gross per 24 hour  Intake 709.92 ml  Output 224 ml  Net 485.92 ml      Physical Exam    General:  Sitting in chair. No resp difficulty HEENT: normal Neck: supple. no JVD. Carotids 2+ bilat; no bruits. No lymphadenopathy or thryomegaly appreciated. Cor: Sternal incisio nlooks good PMI nondisplaced. Regular rate & rhythm. No rubs, gallops or murmurs. Lungs: clear Abdomen: soft, nontender, nondistended. No hepatosplenomegaly. No bruits or masses. Good bowel sounds. Extremities: no cyanosis, clubbing, rash, edema Neuro: alert confused , cranial nerves grossly intact. moves all 4 extremities w/o difficulty. Affect  pleasant  Telemetry   NSR 90-105 Personally reviewed  Labs    CBC Recent Labs    11/06/19 0423 11/06/19 0423 11/07/19 0308 11/08/19 0729  WBC 19.2*   < > 20.3* 19.2*  NEUTROABS 13.4*  --   --   --   HGB 12.7*   < > 12.1* 11.7*  HCT 39.9   < > 36.9* 37.6*  MCV 91.1   < > 88.7 91.9  PLT 468*   < > 448* 546*   < > = values in this interval not displayed.   Basic Metabolic Panel Recent Labs    11/06/19 0423 11/06/19 0423 11/07/19 0308 11/08/19 0718  NA 138   < > 137 139  K 4.1   < > 3.6 4.3  CL 105   < > 103 109  CO2 20*   < > 21* 19*  GLUCOSE 107*   < > 93 77  BUN 16   < > 15 16  CREATININE 0.80   < > 0.79 0.90  CALCIUM 8.4*   < > 8.6* 8.5*  MG 2.3  --  2.2  --   PHOS 4.0  --  3.9  --    < > = values in this interval not displayed.   Liver Function Tests Recent Labs    11/07/19 0308 11/08/19 0718  AST 145* 64*  ALT 317* 199*  ALKPHOS 187* 143*  BILITOT 0.9 0.8  PROT 6.4*  6.3*  ALBUMIN 2.6* 2.6*   No results for input(s): LIPASE, AMYLASE in the last 72 hours. Cardiac Enzymes No results for input(s): CKTOTAL, CKMB, CKMBINDEX, TROPONINI in the last 72 hours.  BNP: BNP (last 3 results) Recent Labs    10/17/19 0605  BNP 608.8*    ProBNP (last 3 results) No results for input(s): PROBNP in the last 8760 hours.   D-Dimer No results for input(s): DDIMER in the last 72 hours. Hemoglobin A1C No results for input(s): HGBA1C in the last 72 hours. Fasting Lipid Panel Recent Labs    11/06/19 0423  TRIG 123   Thyroid Function Tests No results for input(s): TSH, T4TOTAL, T3FREE, THYROIDAB in the last 72 hours.  Invalid input(s): FREET3  Other results:   Imaging    No results found.   Medications:     Scheduled Medications: . amiodarone  400 mg Oral BID  . aspirin  81 mg Per Tube Daily  . atorvastatin  40 mg Oral Daily  . Chlorhexidine Gluconate Cloth  6 each Topical Daily  . clopidogrel  75 mg Oral Daily  . docusate sodium  100 mg  Oral Daily  . feeding supplement (ENSURE ENLIVE)  237 mL Oral TID BM  . feeding supplement (PRO-STAT SUGAR FREE 64)  30 mL Oral BID  . folic acid  1 mg Oral Daily  . insulin aspart  0-9 Units Subcutaneous TID AC & HS  . ketotifen  1 drop Both Eyes BID  . pantoprazole  40 mg Oral Q2000  . sacubitril-valsartan  1 tablet Oral BID  . sodium chloride flush  10-40 mL Intracatheter Q12H  . spironolactone  25 mg Oral Daily  . thiamine  100 mg Oral Daily  . torsemide  40 mg Oral Daily  . Warfarin - Pharmacist Dosing Inpatient   Does not apply q1600    Infusions: . sodium chloride Stopped (10/28/19 0227)  . sodium chloride Stopped (11/03/19 1425)  . heparin 1,000 Units/hr (11/08/19 0800)  . milrinone 0.125 mcg/kg/min (11/08/19 0800)    PRN Medications: sodium chloride, benzonatate, bisacodyl **OR** bisacodyl, hydrALAZINE, HYDROmorphone (DILAUDID) injection, metoprolol tartrate, midazolam, ondansetron (ZOFRAN) IV, sodium chloride flush, sodium chloride flush   Assessment/Plan   1. Cardiogenic shock: Pre-OR echo with EF 45%, apical hypokinesis.  TEE in OR after VF arrest showed severe biventricular dysfunction. Placed on ECMO. Repeat echo 6/6 with EF 20-25%, RV function improved/near normal. Decannulated to Impella 5.5 on 6/7. Impella 5.5 removed in OR 6/11 - Off NE. Milrinone at 0.125. No PICC to check co-ox. Hemodynamically stable. Stop milrinone - Weight back to baseline. Off lasix gtt. Start torsemide 40 po daily today - Continue Entresto 97-103 bid  - Continue spiro 25 mg daily  - no b-blocker yet 2. Pericardial tamponade: Due to LAD perforation.  Now resolved in OR.  3. CAD: S/p DES to distal RCA on 5/25.  Has known CTO OM.  Had attempted intervention on CTO LAD that was complicated by LAD perforation, now s/p LAD repair.  The LAD was not graftable, LAD territory therefore is not perfused except by pre-existing collaterals. - Has recent RCA stent - Continue DAPT w/ ASA + Plavix  -  Continue statin, atorvastatin 40 daily.   4. ETOH abuse: - likely out of the window of WD 5. Smoking: Will need to quit.  6. Acute hypoxemic respiratory failure: improving - extubated.   - On [place]. No change 7. Neuro: Nonfocal 8. GI: Suspect ileus. - resolved. Off TPN - Prealbumin  trending up 9. VF arrest: In OR.  Occasional PVCs.  - continue amiodarone gtt, while on milrinone  - Off lidocaine.  10. ID:  - WBC stuck at 19-20K Tm 98.7 - No localizing features - Completed vancomycin/meropenem.  - CX remain negative - Continue IS  11. RA thrombus: Discussed with Dr. Prescott Gum by phone after device removal, there is some thrombus noted in RA on TEE in OR.   - INR 1.1 -> 2.2 today. Discussed dosing with PharmD personally. Will hold warfarin tonight given quick jump. Stop heparin 12. Severe protein-calorie malnutrition - prealbumin trending up 10>>17.  - advancing diet. Off TPN 13. Acute encephalopathy - suspect component of anoxic brain injury due to long arrest. Seems to wax and wane - late-presenting ETOH WD unlikely but not impossible - avoid narcotics -  start Zyprexa  - frequent cues   Length of Stay: [place], MD  11/08/2019, 8:18 AM  Advanced Heart Failure Team Pager [phone] (M-F; 7a - 4p)  Please contact [facility] for night-coverage after hours (4p -7a ) and weekends on [email or web address]

## 2019-11-08 NOTE — Progress Notes (Signed)
Patient ID: William Crawford, male   DOB: [birth year], 49 y.o.   MRN: [id number] EVENING ROUNDS NOTE :     [address]       [place] [address]             [phone]                 5 Days Post-Op Procedure(s) (LRB): REMOVAL OF IMPELLA 5.5 LEFT VENTRICULAR ASSIST DEVICE (N/A) TRANSESOPHAGEAL ECHOCARDIOGRAM (TEE) (N/A)  DATE OF PROCEDURE:  10/25/2019  OPERATION: 1.  Emergency sternotomy and placement on cardiopulmonary bypass  2.  Drainage of pericardial tamponade. 3.  Repair of perforation of the left anterior descending from percutaneous coronary intervention catheter. 4.  Cannulation for venoarterial extracorporeal membrane oxygenation. 5.  Placement of wound VAC.  SURGEON:  [name], MD  Total Length of Stay:  LOS: 14 days  BP 109/78 (BP Location: Left Arm)   Pulse (!) 103   Temp (!) 97.3 F (36.3 C) (Oral)   Resp (!) 25   Ht 5' 8.5" (1.74 m)   Wt 84.3 kg   SpO2 99%   BMI 27.85 kg/m   .Intake/Output      06/15 0701 - 06/16 0700 06/16 0701 - 06/17 0700   P.O. 160    I.V. (mL/kg) 532 (6.3) 32.9 (0.4)   Total Intake(mL/kg) 692 (8.2) 32.9 (0.4)   Urine (mL/kg/hr) 202 (0.1) 950 (1)   Emesis/NG output     Stool 2 0   Chest Tube 20    Total Output 224 950   Net +468 -917.1        Urine Occurrence 3 x 5 x   Stool Occurrence 3 x 5 x     . sodium chloride Stopped (10/28/19 0227)  . sodium chloride Stopped (11/03/19 1425)     Lab Results  Component Value Date   WBC 19.2 (H) 11/08/2019   HGB 11.7 (L) 11/08/2019   HCT 37.6 (L) 11/08/2019   PLT 546 (H) 11/08/2019   GLUCOSE 77 11/08/2019   CHOL 193 10/18/2019   TRIG 123 11/06/2019   HDL 37 (L) 10/18/2019   LDLCALC 122 (H) 10/18/2019   ALT 199 (H) 11/08/2019   AST 64 (H) 11/08/2019   NA 139 11/08/2019   K 4.3 11/08/2019   CL 109 11/08/2019   CREATININE 0.90 11/08/2019   BUN 16 11/08/2019   CO2 19 (L) 11/08/2019   INR 2.2 (H) 11/08/2019   HGBA1C 5.2 10/18/2019   Stable in  chair , coughing    Grace Isaac MD  Beeper [phone] Office [phone] 11/08/2019 5:46 PM

## 2019-11-08 NOTE — Progress Notes (Signed)
Inpatient Diabetes Program Recommendations  AACE/ADA: New Consensus Statement on Inpatient Glycemic Control (2015)  Target Ranges:  Prepandial:   less than 140 mg/dL      Peak postprandial:   less than 180 mg/dL (1-2 hours)      Critically ill patients:  140 - 180 mg/dL   Lab Results  Component Value Date   GLUCAP 77 11/08/2019   HGBA1C 5.2 10/18/2019    Review of Glycemic Control Results for William Crawford, William Crawford (MRN [id number]) as of 11/08/2019 10:07  Ref. Range 11/07/2019 16:08 11/07/2019 21:26 11/07/2019 21:34 11/08/2019 06:37  Glucose-Capillary Latest Ref Range: 70 - 99 mg/dL 95 34 (LL) 142 (H) 77   Inpatient Diabetes Program Recommendations:    Please d/c Novolog correction.   Thanks  Adah Perl, RN, BC-ADM Inpatient Diabetes Coordinator Pager [phone] (8a-5p)

## 2019-11-08 NOTE — Progress Notes (Signed)
°  Speech Language Pathology Treatment: Dysphagia  Patient Details Name: William Crawford MRN: [id number] DOB: Feb 04, 1971 Today's Date: 11/08/2019 Time: 0712-1975 SLP Time Calculation (min) (ACUTE ONLY): 9 min  Assessment / Plan / Recommendation Clinical Impression  Pt was seen for dysphagia treatment and was cooperative throughout the session. Pt, nursing, and his mother reported that the pt has been tolerating the current diet without overt s/sx of aspiration. Pt's vocal quality was clear at baseline and throughout the session. Pt tolerated regular texture solids, mixed consistency boluses (mech. soft with thin liquids) and thin liquids via straw using consecutive swallows without symptoms of oropharyngeal dysphagia. It is recommended that the current diet be continued. Further skilled SLP services are not clinically indicated at this time.    HPI HPI: Patient is a 49 y.o. male with PMH: ETOH abuse, CAD s/p DES placement 5/25, tobacco use,  who was admitted on 6/2 following CTO PCI to LAD during which he developed a perforated LAD and went into vfib arrest. He then underwent emergent repair but was unable to be removed from bypass following procedure and PCCM was consulted for ECMO. He was initiated on [place] ECMO and decannulated to impella on 6/7. Impella removed 6/11. He was intubated 6/2 and extubated in AM on 6/11. CXR of 6/16 removal of right chest tube, without pneumothorax. Improved right infrahilar atelectasis.      SLP Plan  Continue with current plan of care       Recommendations  Diet recommendations: Regular;Thin liquid Liquids provided via: Cup;Straw Medication Administration: Whole meds with puree Supervision: Patient able to self feed Compensations: Slow rate;Small sips/bites                Oral Care Recommendations: Oral care BID Follow up Recommendations: Other (comment) (tbd) SLP Visit Diagnosis: Dysphagia, unspecified (R13.10) Plan: Continue with current plan of  care       Cynda Soule I. Hardin Negus, [place], [place] Office number [phone] Pager [place] 11/08/2019, 2:51 PM

## 2019-11-08 NOTE — Progress Notes (Signed)
Hypoglycemic Event  CBG: 56 @ 1132  Treatment: cranberry juice  Symptoms: none  Follow-up CBG: Time: 1156 CBG Result: 103  Possible Reasons for Event: unknown  Comments/MD notified: N/A    William Crawford

## 2019-11-08 NOTE — Progress Notes (Signed)
5 Days Post-Op Procedure(s) (LRB): REMOVAL OF IMPELLA 5.5 LEFT VENTRICULAR ASSIST DEVICE (N/A) TRANSESOPHAGEAL ECHOCARDIOGRAM (TEE) (N/A) Subjective: Less confused but still with impulsive behavior, sitter present Milrinone weaning off today CXR clear after chest tube removal yesterday Sternal incision clean, dry Cellulitis dorsum L hand , WBC 19 k - will start po Keflex and place K pad NSR   Objective: Vital signs in last 24 hours: Temp:  [97.6 F (36.4 C)-98.7 F (37.1 C)] 97.9 F (36.6 C) (06/16 5732) Pulse Rate:  [50-102] 93 (06/16 0900) Cardiac Rhythm: Sinus tachycardia (06/16 0800) Resp:  [16-31] 23 (06/16 0900) BP: (103-149)/(58-100) 110/79 (06/16 0900) SpO2:  [75 %-100 %] 98 % (06/16 0900)        Exam    General- alert and comfortable    Neck- no JVD, no cervical adenopathy palpable, no carotid bruit   Lungs- clear without rales, wheezes   Cor- regular rate and rhythm, no murmur , gallop   Abdomen- soft, non-tender   Extremities - warm, non-tender, minimal edema   Neuro- oriented, appropriate, no focal weakness   Hemodynamic parameters for last 24 hours:    Intake/Output from previous day: 06/15 0701 - 06/16 0700 In: 692 [P.O.:160; I.V.:532] Out: 224 [Urine:202; Stool:2; Chest Tube:20] Intake/Output this shift: Total I/O In: 32.9 [I.V.:32.9] Out: -     Lab Results: Recent Labs    11/07/19 0308 11/08/19 0729  WBC 20.3* 19.2*  HGB 12.1* 11.7*  HCT 36.9* 37.6*  PLT 448* 546*   BMET:  Recent Labs    11/07/19 0308 11/08/19 0718  NA 137 139  K 3.6 4.3  CL 103 109  CO2 21* 19*  GLUCOSE 93 77  BUN 15 16  CREATININE 0.79 0.90  CALCIUM 8.6* 8.5*    PT/INR:  Recent Labs    11/08/19 0718  LABPROT 23.4*  INR 2.2*   ABG    Component Value Date/Time   PHART 7.545 (H) 11/04/2019 0501   HCO3 27.7 11/04/2019 0501   TCO2 29 11/04/2019 0501   ACIDBASEDEF 1.0 10/31/2019 0644   O2SAT 71.6 11/04/2019 1747   CBG (last 3)  Recent Labs     11/07/19 2126 11/07/19 2134 11/08/19 0637  GLUCAP 34* 142* 77    Assessment/Plan: S/P Procedure(s) (LRB): REMOVAL OF IMPELLA 5.5 LEFT VENTRICULAR ASSIST DEVICE (N/A) TRANSESOPHAGEAL ECHOCARDIOGRAM (TEE) (N/A) Keep in ICU today to follow status after Milrinone off and because of impulsive beavior Heparin off\ On coumadin for RA mural thrombus from ECMO cannula  On Plavix for recent RCA DES  LOS: 14 days    [name] 11/08/2019

## 2019-11-09 LAB — GLUCOSE, CAPILLARY
Glucose-Capillary: 108 mg/dL — ABNORMAL HIGH (ref 70–99)
Glucose-Capillary: 132 mg/dL — ABNORMAL HIGH (ref 70–99)
Glucose-Capillary: 145 mg/dL — ABNORMAL HIGH (ref 70–99)
Glucose-Capillary: 97 mg/dL (ref 70–99)

## 2019-11-09 LAB — COMPREHENSIVE METABOLIC PANEL
ALT: 199 U/L — ABNORMAL HIGH (ref 0–44)
AST: 76 U/L — ABNORMAL HIGH (ref 15–41)
Albumin: 2.8 g/dL — ABNORMAL LOW (ref 3.5–5.0)
Alkaline Phosphatase: 146 U/L — ABNORMAL HIGH (ref 38–126)
Anion gap: 13 (ref 5–15)
BUN: 20 mg/dL (ref 6–20)
CO2: 23 mmol/L (ref 22–32)
Calcium: 8.3 mg/dL — ABNORMAL LOW (ref 8.9–10.3)
Chloride: 102 mmol/L (ref 98–111)
Creatinine, Ser: 1.01 mg/dL (ref 0.61–1.24)
GFR calc Af Amer: >60 mL/min (ref >60)
GFR calc non Af Amer: >60 mL/min (ref >60)
Glucose, Bld: 101 mg/dL — ABNORMAL HIGH (ref 70–99)
Potassium: 3.7 mmol/L (ref 3.5–5.1)
Sodium: 138 mmol/L (ref 135–145)
Total Bilirubin: 0.6 mg/dL (ref 0.3–1.2)
Total Protein: 6.5 g/dL (ref 6.5–8.1)

## 2019-11-09 LAB — CBC
HCT: 37 % — ABNORMAL LOW (ref 39.0–52.0)
Hemoglobin: 11.8 g/dL — ABNORMAL LOW (ref 13.0–17.0)
MCH: 28.8 pg (ref 26.0–34.0)
MCHC: 31.9 g/dL (ref 30.0–36.0)
MCV: 90.2 fL (ref 80.0–100.0)
Platelets: 632 10*3/uL — ABNORMAL HIGH (ref 150–400)
RBC: 4.1 MIL/uL — ABNORMAL LOW (ref 4.22–5.81)
RDW: 14.4 % (ref 11.5–15.5)
WBC: 14 10*3/uL — ABNORMAL HIGH (ref 4.0–10.5)
nRBC: 0 % (ref 0.0–0.2)

## 2019-11-09 LAB — LACTATE DEHYDROGENASE: LDH: 319 U/L — ABNORMAL HIGH (ref 98–192)

## 2019-11-09 LAB — PROTIME-INR
INR: 1.5 — ABNORMAL HIGH (ref 0.8–1.2)
Prothrombin Time: 17.7 seconds — ABNORMAL HIGH (ref 11.4–15.2)

## 2019-11-09 MED ORDER — WARFARIN SODIUM 7.5 MG PO TABS
7.5000 mg | ORAL_TABLET | Freq: Once | ORAL | Status: AC
  Administered 2019-11-09: 7.5 mg via ORAL
  Filled 2019-11-09: qty 1

## 2019-11-09 MED ORDER — POTASSIUM CHLORIDE CRYS ER 20 MEQ PO TBCR
20.0000 meq | EXTENDED_RELEASE_TABLET | ORAL | Status: AC
  Administered 2019-11-09 (×3): 20 meq via ORAL
  Filled 2019-11-09 (×3): qty 1

## 2019-11-09 NOTE — Progress Notes (Signed)
Pt transfer from 2 heart to 4E06 he is alert and oriented times 4. One assist, denied having any pain upon arrival. Mid sternum clean dry and intact with staples. RA last BS 97 VS WNL Pt oriented to call light bathroom and surroundings.

## 2019-11-09 NOTE — Progress Notes (Signed)
Physical Therapy Treatment Patient Details Name: William Crawford MRN: [id number] DOB: 1970/05/30 Today's Date: 11/09/2019    History of Present Illness Pt is 49 yo male who presented with chest pressure, NSTEMI, underwent DES to distal RCA on 5/25. Had attempted intervention on CTO LAD that was complicated by LAD perforation with VF arrest. TEE showed severe biventricular dysfunction. Placed on ECMO, decannulated to impella 6/7, impella removed 6/11.In addition, suspected ileus. PMH: tobacco and EtOH abuse, GERD.     PT Comments    Pt sitting in recliner on arrival, pleasant, participative and with significantly improved cognition and mobility this session. Pt able to walk the unit and follow all commands. 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 Pt plans to stay with mom at D/C and reports 24hr supervision. Pt encouraged to continue mobility with nursing but aware of current cognitive deficits and need for supervision. Will continue to follow.     Follow Up Recommendations  Home health PT;Supervision/Assistance - 24 hour     Equipment Recommendations  None recommended by PT    Recommendations for Other Services       Precautions / Restrictions Precautions Precautions: Sternal;Fall    Mobility  Bed Mobility               General bed mobility comments: in recliner on arrival and end of session  Transfers Overall transfer level: Needs assistance   Transfers: Sit to/from Stand Sit to Stand: Min guard         General transfer comment: cues for hand placement and line management  Ambulation/Gait Ambulation/Gait assistance: Min guard Gait Distance (Feet): 400 Feet Assistive device: None Gait Pattern/deviations: Step-through pattern;Decreased stride length   Gait velocity interpretation: >2.62 ft/sec, indicative of community ambulatory General Gait Details: pt with good stability with gait with increased tolerance for  distance with cues needed for direction   Stairs             Wheelchair Mobility    Modified Rankin (Stroke Patients Only)       Balance Overall balance assessment: Needs assistance   Sitting balance-Leahy Scale: Good       Standing balance-Leahy Scale: Good   Single Leg Stance - Right Leg: 15 Single Leg Stance - Left Leg: 2 Tandem Stance - Right Leg: 20 Tandem Stance - Left Leg: 20 [place] - Eyes Opened: 60 [place] - Eyes Closed: 30   High Level Balance Comments: pt able to perform 360 degree turn <4 sec, complete SLS and tandem without assist. pt could not stand on Left LE for extended period stating pain in his arch            Cognition Arousal/Alertness: Awake/alert Behavior During Therapy: WFL for tasks assessed/performed Overall Cognitive Status: Impaired/Different from baseline Area of Impairment: Memory;Safety/judgement;Problem solving                   Current Attention Level: Alternating Memory: Decreased short-term memory;Decreased recall of precautions Following Commands: Follows one step commands consistently Safety/Judgement: Decreased awareness of deficits Awareness: Emergent   General Comments: pt with significantly improved cognition this session. pt able to state president, spell world backward after 3 tries with pt self correcting. ONly able to name 2/3 "c" animals. Following all commands      Exercises General Exercises - Lower Extremity Long Arc Quad: AROM;Both;Seated;20 reps Hip ABduction/ADduction: AROM;Both;Seated;20 reps Hip Flexion/Marching: AROM;Both;Seated;20 reps    General Comments  Pertinent Vitals/Pain Pain Assessment: No/denies pain    Home Living                      Prior Function            PT Goals (current goals can now be found in the care plan section) Progress towards PT goals: Goals met and updated - see care plan    Frequency    Min 3X/week      PT Plan Discharge  plan needs to be updated    Co-evaluation              AM-PAC PT "6 Clicks" Mobility   Outcome Measure  Help needed turning from your back to your side while in a flat bed without using bedrails?: None Help needed moving from lying on your back to sitting on the side of a flat bed without using bedrails?: None Help needed moving to and from a bed to a chair (including a wheelchair)?: A Little Help needed standing up from a chair using your arms (e.g., wheelchair or bedside chair)?: A Little Help needed to walk in hospital room?: A Little Help needed climbing 3-5 steps with a railing? : A Little 6 Click Score: 20    End of Session Equipment Utilized During Treatment: Gait belt Activity Tolerance: Patient tolerated treatment well Patient left: in chair;with call bell/phone within reach;with chair alarm set Nurse Communication: Mobility status PT Visit Diagnosis: Other abnormalities of gait and mobility (R26.89)     Time: 5726-2035 PT Time Calculation (min) (ACUTE ONLY): 20 min  Charges:  $Gait Training: 8-22 mins                     Jeannie Mallinger P, PT Acute Rehabilitation Services Pager: [phone] Office: [place] [facility] Stormi [place] 11/09/2019, 11:31 AM

## 2019-11-09 NOTE — Progress Notes (Addendum)
Patient ID: William Crawford, male   DOB: 02-25-1971, 49 y.o.   MRN: [id number]     Advanced Heart Failure Rounding Note  PCP-Cardiologist: No primary care provider on file.   Subjective:    - LAD repair + VA ECMO cannulation in OR 6/3. Chest wall open. - 6/7: ECMO decannulation, Impella 5.5 placement  - 6/11: Impella 5.5 removal.   - Echo (6/3): EF < 20%, severe RV dysfunction - Echo (6/6): EF 20-25%, normal RV function - Echo (6/8): EF 25-30%, normal RV - Echo (6/10): EF 40%, normal RV  Agitated and confused 6/15. Pulled PICC.  Started on Zyprexa 6/16. Less confusion/agitation today.   Milrinone discontinued yesterday. No central access to measure co-ox. VSS.  Now on PO diuretics. SCr stable   Sitting up in chair. Finished breakfast. Appetite is good. No dyspnea.    Objective:   Weight Range: 84.3 kg Body mass index is 27.85 kg/m.   Vital Signs:   Temp:  [97.3 F (36.3 C)-98.3 F (36.8 C)] 97.6 F (36.4 C) (06/17 0739) Pulse Rate:  [34-123] 91 (06/17 0600) Resp:  [13-36] 36 (06/17 0600) BP: (85-159)/(49-139) 111/93 (06/17 0600) SpO2:  [86 %-100 %] 95 % (06/17 0600) Last BM Date: 11/08/19  Weight change: Filed Weights   11/05/19 0600 11/06/19 0600 11/07/19 0500  Weight: 93.3 kg 87.3 kg 84.3 kg    Intake/Output:   Intake/Output Summary (Last 24 hours) at 11/09/2019 0813 Last data filed at 11/09/2019 0600 Gross per 24 hour  Intake 367.28 ml  Output 1350 ml  Net -982.72 ml      Physical Exam    PHYSICAL EXAM: General:  Well appearing. No respiratory difficulty, appears mildly anxious at times  HEENT: normal Neck: supple. no JVD. Carotids 2+ bilat; no bruits. No lymphadenopathy or thyromegaly appreciated. Cor: PMI nondisplaced. Regular rate & rhythm. No rubs, gallops or murmurs. Sternotomy site stable  Lungs: clear Abdomen: soft, nontender, nondistended. No hepatosplenomegaly. No bruits or masses. Good bowel sounds. Extremities: no cyanosis, clubbing,  rash, edema Neuro: alert & oriented x 3, cranial nerves grossly intact. moves all 4 extremities w/o difficulty. Affect pleasant.   Telemetry   NSR 90-105 Personally reviewed  Labs    CBC Recent Labs    11/08/19 0729 11/09/19 0238  WBC 19.2* 14.0*  HGB 11.7* 11.8*  HCT 37.6* 37.0*  MCV 91.9 90.2  PLT 546* 779*   Basic Metabolic Panel Recent Labs    11/07/19 0308 11/07/19 0308 11/08/19 0718 11/09/19 0238  NA 137   < > 139 138  K 3.6   < > 4.3 3.7  CL 103   < > 109 102  CO2 21*   < > 19* 23  GLUCOSE 93   < > 77 101*  BUN 15   < > 16 20  CREATININE 0.79   < > 0.90 1.01  CALCIUM 8.6*   < > 8.5* 8.3*  MG 2.2  --   --   --   PHOS 3.9  --   --   --    < > = values in this interval not displayed.   Liver Function Tests Recent Labs    11/08/19 0718 11/09/19 0238  AST 64* 76*  ALT 199* 199*  ALKPHOS 143* 146*  BILITOT 0.8 0.6  PROT 6.3* 6.5  ALBUMIN 2.6* 2.8*   No results for input(s): LIPASE, AMYLASE in the last 72 hours. Cardiac Enzymes No results for input(s): CKTOTAL, CKMB, CKMBINDEX, TROPONINI in the last  72 hours.  BNP: BNP (last 3 results) Recent Labs    10/17/19 0605  BNP 608.8*    ProBNP (last 3 results) No results for input(s): PROBNP in the last 8760 hours.   D-Dimer No results for input(s): DDIMER in the last 72 hours. Hemoglobin A1C No results for input(s): HGBA1C in the last 72 hours. Fasting Lipid Panel No results for input(s): CHOL, HDL, LDLCALC, TRIG, CHOLHDL, LDLDIRECT in the last 72 hours. Thyroid Function Tests No results for input(s): TSH, T4TOTAL, T3FREE, THYROIDAB in the last 72 hours.  Invalid input(s): FREET3  Other results:   Imaging    No results found.   Medications:     Scheduled Medications: . amiodarone  400 mg Oral BID  . aspirin  81 mg Per Tube Daily  . atorvastatin  40 mg Oral Daily  . cephALEXin  500 mg Oral QID  . Chlorhexidine Gluconate Cloth  6 each Topical Daily  . clopidogrel  75 mg Oral  Daily  . docusate sodium  100 mg Oral Daily  . feeding supplement (ENSURE ENLIVE)  237 mL Oral TID BM  . feeding supplement (PRO-STAT SUGAR FREE 64)  30 mL Oral BID  . folic acid  1 mg Oral Daily  . insulin aspart  0-9 Units Subcutaneous TID AC & HS  . ketotifen  1 drop Both Eyes BID  . OLANZapine  2.5 mg Oral QHS  . pantoprazole  40 mg Oral Q2000  . potassium chloride  20 mEq Oral Q4H  . sacubitril-valsartan  1 tablet Oral BID  . sodium chloride flush  10-40 mL Intracatheter Q12H  . spironolactone  25 mg Oral Daily  . thiamine  100 mg Oral Daily  . torsemide  40 mg Oral Daily  . Warfarin - Pharmacist Dosing Inpatient   Does not apply q1600    Infusions: . sodium chloride Stopped (10/28/19 0227)  . sodium chloride Stopped (11/03/19 1425)    PRN Medications: sodium chloride, benzonatate, bisacodyl **OR** bisacodyl, Gerhardt's butt cream, hydrALAZINE, HYDROmorphone (DILAUDID) injection, metoprolol tartrate, midazolam, ondansetron (ZOFRAN) IV, sodium chloride flush, sodium chloride flush   Assessment/Plan   1. Cardiogenic shock: Pre-OR echo with EF 45%, apical hypokinesis.  TEE in OR after VF arrest showed severe biventricular dysfunction. Placed on ECMO. Repeat echo 6/6 with EF 20-25%, RV function improved/near normal. Decannulated to Impella 5.5 on 6/7. Impella 5.5 removed in OR 6/11 - off pressors/inotropes. NE and milrinone discontinued. No PICC to check co-ox. Hemodynamically stable. - Weight back to baseline. Continue torsemide 40 po daily - Continue Entresto 97-103 bid  - Continue spiro 25 mg daily  - no b-blocker yet 2. Pericardial tamponade: Due to LAD perforation.  Now resolved in OR.  3. CAD: S/p DES to distal RCA on 5/25.  Has known CTO OM.  Had attempted intervention on CTO LAD that was complicated by LAD perforation, now s/p LAD repair.  The LAD was not graftable, LAD territory therefore is not perfused except by pre-existing collaterals. - Has recent RCA stent -  Continue DAPT w/ ASA + Plavix  - Continue statin, atorvastatin 40 daily.   4. ETOH abuse: - likely out of the window of WD 5. Smoking: Will need to quit.  6. Acute hypoxemic respiratory failure: improving - extubated.   - On Carlos. No change 7. Neuro: Nonfocal 8. GI: Suspect ileus. - resolved. Off TPN - Prealbumin trending up 9. VF arrest: In OR.  Occasional PVCs.  - loaded w/ amio gtt. Now on PO  400 mg BID - Off lidocaine.  10. ID:  - WBC trending down, 19>>14K AF - No localizing features - Completed vancomycin/meropenem.  - CX remain negative - Continue IS  11. RA thrombus: Discussed with Dr. Prescott Gum by phone after device removal, there is some thrombus noted in RA on TEE in OR.   - INR 1.5. Coumadin/ heparin dosing per pharmacy  12. Severe protein-calorie malnutrition - prealbumin trending up 10>>17.  - advancing diet. Off TPN 13. Acute encephalopathy - suspect component of anoxic brain injury due to long arrest. Seems to wax and wane - late-presenting ETOH WD unlikely but not impossible - avoid narcotics -  Continue Zyprexa  - frequent cues   Length of Stay: [address], PA-C  11/09/2019, 8:13 AM  Advanced Heart Failure Team Pager [phone] (M-F; 7a - 4p)  Please contact [facility] for night-coverage after hours (4p -7a ) and weekends on [email or web address]  Patient seen and examined with the above-signed Advanced Practice Provider and/or Housestaff. I personally reviewed laboratory data, imaging studies and relevant notes. I independently examined the patient and formulated the important aspects of the plan. I have edited the note to reflect any of my changes or salient points. I have personally discussed the plan with the patient and/or family.  Off milrinone. Hemodynamics stable. Volume status looks good. Less confused today. Still with some word finding difficulty but oriented x 3.  On heparin/warfarin. INR 1.5. Discussed dosing with PharmD  personally.  General:  Well appearing. No resp difficulty HEENT: normal Neck: supple. no JVD. Carotids 2+ bilat; no bruits. No lymphadenopathy or thryomegaly appreciated. Cor: incision ok  PMI nondisplaced. Regular rate & rhythm. No rubs, gallops or murmurs. Lungs: clear Abdomen: soft, nontender, nondistended. No hepatosplenomegaly. No bruits or masses. Good bowel sounds. Extremities: no cyanosis, clubbing, rash, edema Neuro: alert & orientedx3, cranial nerves grossly intact. moves all 4 extremities w/o difficulty. Affect pleasant  Transfer to 4E today. D/w Drs Prescott Gum and CIR. Possible transfer to CIR tomorrow (awaiting insurance. No beds today). Titrate GDMT as possible.   Glori Bickers, MD  10:18 AM

## 2019-11-09 NOTE — Progress Notes (Signed)
Inpatient Rehab Admissions Coordinator:   Pt less confused, planning to transfer to stepdown today.  I spoke with his mother and will send info to insurance for prior authorization request today.  Will continue to follow for timing of possible rehab admission pending insurance auth and bed availability.   Shann Medal, PT, DPT Admissions Coordinator [phone] 11/09/19  9:59 AM

## 2019-11-09 NOTE — Progress Notes (Signed)
[facility] BRIEF SICU PROGRESS NOTE  6 Days Post-Op  S/P Procedure(s) (LRB): REMOVAL OF IMPELLA 5.5 LEFT VENTRICULAR ASSIST DEVICE (N/A) TRANSESOPHAGEAL ECHOCARDIOGRAM (TEE) (N/A)   Stable day  Plan: Continue current plan  Rexene Alberts, MD 11/09/2019 6:58 PM

## 2019-11-09 NOTE — Progress Notes (Signed)
6 Days Post-Op Procedure(s) (LRB): REMOVAL OF IMPELLA 5.5 LEFT VENTRICULAR ASSIST DEVICE (N/A) TRANSESOPHAGEAL ECHOCARDIOGRAM (TEE) (N/A) Subjective: Mental status and confusion improved, no longer needing a sitter Maintaining sinus rhythm White count improved on oral Keflex for cellulitis of left hand IV site We'll remove epicardial pacing wires today as INR 1.5 Patient should be ready for transfer to stepdown and then to CIR when bed available. Sternal staples in place for at least 2 weeks   Objective: Vital signs in last 24 hours: Temp:  [97.3 F (36.3 C)-98.3 F (36.8 C)] 97.6 F (36.4 C) (06/17 0739) Pulse Rate:  [34-123] 88 (06/17 0900) Cardiac Rhythm: Normal sinus rhythm (06/17 0800) Resp:  [13-36] 22 (06/17 0900) BP: (85-159)/(49-139) 111/93 (06/17 0600) SpO2:  [86 %-100 %] 95 % (06/17 0900)  Hemodynamic parameters for last 24 hours:    Intake/Output from previous day: 06/16 0701 - 06/17 0700 In: 392.9 [P.O.:360; I.V.:32.9] Out: 1350 [Urine:1350] Intake/Output this shift: Total I/O In: 240 [P.O.:240] Out: -   Exam Alert and comfortable Lungs clear Sinus rhythm without murmur Sternal skin staples in place clean and dry  Lab Results: Recent Labs    11/08/19 0729 11/09/19 0238  WBC 19.2* 14.0*  HGB 11.7* 11.8*  HCT 37.6* 37.0*  PLT 546* 632*   BMET:  Recent Labs    11/08/19 0718 11/09/19 0238  NA 139 138  K 4.3 3.7  CL 109 102  CO2 19* 23  GLUCOSE 77 101*  BUN 16 20  CREATININE 0.90 1.01  CALCIUM 8.5* 8.3*    PT/INR:  Recent Labs    11/09/19 0238  LABPROT 17.7*  INR 1.5*   ABG    Component Value Date/Time   PHART 7.545 (H) 11/04/2019 0501   HCO3 27.7 11/04/2019 0501   TCO2 29 11/04/2019 0501   ACIDBASEDEF 1.0 10/31/2019 0644   O2SAT 71.6 11/04/2019 1747   CBG (last 3)  Recent Labs    11/08/19 1604 11/08/19 2103 11/09/19 0737  GLUCAP 82 76 108*    Assessment/Plan: S/P Procedure(s) (LRB): REMOVAL OF IMPELLA 5.5 LEFT  VENTRICULAR ASSIST DEVICE (N/A) TRANSESOPHAGEAL ECHOCARDIOGRAM (TEE) (N/A) Remove epicardial pacing wires Continue Coumadin loading, will not resume heparin bridge at this point Ready for transfer to stepdown then  CIR.   LOS: 15 days    William Crawford 11/09/2019

## 2019-11-10 LAB — CBC
HCT: 39.8 % (ref 39.0–52.0)
Hemoglobin: 12.8 g/dL — ABNORMAL LOW (ref 13.0–17.0)
MCH: 28.7 pg (ref 26.0–34.0)
MCHC: 32.2 g/dL (ref 30.0–36.0)
MCV: 89.2 fL (ref 80.0–100.0)
Platelets: 701 10*3/uL — ABNORMAL HIGH (ref 150–400)
RBC: 4.46 MIL/uL (ref 4.22–5.81)
RDW: 14 % (ref 11.5–15.5)
WBC: 11.8 10*3/uL — ABNORMAL HIGH (ref 4.0–10.5)
nRBC: 0 % (ref 0.0–0.2)

## 2019-11-10 LAB — COMPREHENSIVE METABOLIC PANEL
ALT: 193 U/L — ABNORMAL HIGH (ref 0–44)
AST: 79 U/L — ABNORMAL HIGH (ref 15–41)
Albumin: 3 g/dL — ABNORMAL LOW (ref 3.5–5.0)
Alkaline Phosphatase: 145 U/L — ABNORMAL HIGH (ref 38–126)
Anion gap: 11 (ref 5–15)
BUN: 21 mg/dL — ABNORMAL HIGH (ref 6–20)
CO2: 25 mmol/L (ref 22–32)
Calcium: 8.8 mg/dL — ABNORMAL LOW (ref 8.9–10.3)
Chloride: 101 mmol/L (ref 98–111)
Creatinine, Ser: 1.06 mg/dL (ref 0.61–1.24)
GFR calc Af Amer: >60 mL/min (ref >60)
GFR calc non Af Amer: >60 mL/min (ref >60)
Glucose, Bld: 86 mg/dL (ref 70–99)
Potassium: 4.3 mmol/L (ref 3.5–5.1)
Sodium: 137 mmol/L (ref 135–145)
Total Bilirubin: 0.8 mg/dL (ref 0.3–1.2)
Total Protein: 6.8 g/dL (ref 6.5–8.1)

## 2019-11-10 LAB — PROTIME-INR
INR: 1.5 — ABNORMAL HIGH (ref 0.8–1.2)
Prothrombin Time: 17.7 seconds — ABNORMAL HIGH (ref 11.4–15.2)

## 2019-11-10 LAB — GLUCOSE, CAPILLARY
Glucose-Capillary: 34 mg/dL — CL (ref 70–99)
Glucose-Capillary: 96 mg/dL (ref 70–99)
Glucose-Capillary: 118 mg/dL — ABNORMAL HIGH (ref 70–99)
Glucose-Capillary: 90 mg/dL (ref 70–99)
Glucose-Capillary: 101 mg/dL — ABNORMAL HIGH (ref 70–99)

## 2019-11-10 LAB — LACTATE DEHYDROGENASE: LDH: 341 U/L — ABNORMAL HIGH (ref 98–192)

## 2019-11-10 LAB — URIC ACID: Uric Acid, Serum: 6.2 mg/dL (ref 3.7–8.6)

## 2019-11-10 MED ORDER — HYDROMORPHONE HCL 2 MG PO TABS
1.0000 mg | ORAL_TABLET | ORAL | Status: DC | PRN
  Administered 2019-11-10 – 2019-11-11 (×5): 1 mg via ORAL
  Filled 2019-11-10 (×5): qty 1

## 2019-11-10 MED ORDER — COLCHICINE 0.6 MG PO TABS
0.6000 mg | ORAL_TABLET | Freq: Two times a day (BID) | ORAL | Status: DC
  Administered 2019-11-10 – 2019-11-11 (×2): 0.6 mg via ORAL
  Filled 2019-11-10 (×2): qty 1

## 2019-11-10 MED ORDER — WARFARIN SODIUM 7.5 MG PO TABS
7.5000 mg | ORAL_TABLET | Freq: Once | ORAL | Status: AC
  Administered 2019-11-10: 7.5 mg via ORAL
  Filled 2019-11-10: qty 1

## 2019-11-10 NOTE — Plan of Care (Signed)
Continue to monitor

## 2019-11-10 NOTE — Progress Notes (Signed)
[facility]ith for warfarin Indication: ECMO > impella > R atrial thrombus  No Known Allergies  Patient Measurements: Height: 5' 8.5" (174 cm) Weight: 80.2 kg (176 lb 12.8 oz) IBW/kg (Calculated) : 69.55 Heparin Dosing Weight: 83.9 kg  Vital Signs: Temp: 97.9 F (36.6 C) (06/18 1157) Temp Source: Oral (06/18 1157) BP: 103/60 (06/18 1157) Pulse Rate: 83 (06/18 1157)  Labs: Recent Labs    11/08/19 0718 11/08/19 0729 11/08/19 0729 11/09/19 0238 11/10/19 0335  HGB  --  11.7*   < > 11.8* 12.8*  HCT  --  37.6*  --  37.0* 39.8  PLT  --  546*  --  632* 701*  LABPROT 23.4*  --   --  17.7* 17.7*  INR 2.2*  --   --  1.5* 1.5*  HEPARINUNFRC <0.10*  --   --   --   --   CREATININE 0.90  --   --  1.01 1.06   < > = values in this interval not displayed.    Estimated Creatinine Clearance: 84.8 mL/min (by C-G formula based on SCr of 1.06 mg/dL).   Medical History: Past Medical History:  Diagnosis Date  . Alcohol abuse   . GERD (gastroesophageal reflux disease)   . Tobacco abuse     Assessment: 49 year old male initiated on [place] ECMO> decannulated to impella 6/7.  Impella removed 6/11.  In OR, found RA thrombus noted on TEE.  Fixed rate heparin was increased to 1000 units/hr on 6/14 - level remains undetectable. INR today jumped from 1.1 to 2.2 s/p 4 doses of warfarin. Of note, LFTs had been elevated but trending down - likely contributing to INR increase.  Hgb 12.8, plt 701. LDH stable at 341.   Goal of Therapy:  Eventually will target low heparin level goals, but for now, will keep fixed dose rate and monitor bleeding. INR: 2-3 Monitor platelets by anticoagulation protocol: Yes   Plan:  Coumadin 7.5 mg x 1 tonight. Plan for bASA for 30 days then plavix and warfarin thereafter Check INR and CBC daily  Monitor for s/sx bleeding  Uvaldo Rising, BCPS, [facility] Clinical Pharmacist  11/10/2019 2:43 PM   [facility] pharmacy phone numbers  are listed on [email or web address]

## 2019-11-10 NOTE — Discharge Instructions (Signed)
Discharge Instructions:  1. You may shower, please wash incisions daily with soap and water and keep dry.  If you wish to cover wounds with dressing you may do so but please keep clean and change daily.  No tub baths or swimming until incisions have completely healed.  If your incisions become red or develop any drainage please call our office at [phone]  2. No Driving until cleared by Dr. Lucianne Lei Trigt's office and you are no longer using narcotic pain medications  3. Monitor your weight daily.. Please use the same scale and weigh at same time... If you gain 3-5 lbs in 48 hours with associated lower extremity swelling, please contact our office at [phone]  4. Fever of 101.5 for at least 24 hours with no source, please contact our office at [phone]  5. Activity- up as tolerated, please walk at least 3 times per day.  Avoid strenuous activity, no lifting, pushing, or pulling with your arms over 8-10 lbs for a minimum of 6 weeks  6. If any questions or concerns arise, please do not hesitate to contact our office at [phone]

## 2019-11-10 NOTE — Progress Notes (Signed)
Occupational Therapy Treatment Patient Details Name: William Crawford MRN: [id number] DOB: March 28, 1971 Today's Date: 11/10/2019    History of present illness Pt is 49 yo male who presented with chest pressure, NSTEMI, underwent DES to distal RCA on 5/25. Had attempted intervention on CTO LAD that was complicated by LAD perforation with VF arrest. TEE showed severe biventricular dysfunction. Placed on ECMO, decannulated to impella 6/7, impella removed 6/11.In addition, suspected ileus. PMH: tobacco and EtOH abuse, GERD.    OT comments  Patient continues to make steady progress towards goals in skilled OT session. Patient's session encompassed further education with regard to sternal precautions, basic ADLS, and further assessment of overall cognition. Pt with improved recall of sternal precautions, and able to state 2/3, taught the pt "move within the tube" to aid in recall with successful demonstration noted. Pt remains impulsive, as therapist went to retrieve mask (told pt to stay EOB) and pt was up in the middle of the room with his monitors disconnected. Pt handed off to PT at end of session, will continue to follow acutely.    Follow Up Recommendations  CIR;Supervision/Assistance - 24 hour    Equipment Recommendations  Other (comment);Tub/shower seat (TBD)    Recommendations for Other Services      Precautions / Restrictions Precautions Precautions: Sternal;Fall Precaution Booklet Issued: No Precaution Comments: Able to recall 2/3 sternal precautions now, intermittent combativeness per nursing       Mobility Bed Mobility                  Transfers                      Balance                                           ADL either performed or assessed with clinical judgement   ADL Overall ADL's : Needs assistance/impaired     Grooming: Supervision/safety;Standing;Set up;Brushing hair;Wash/dry hands                                Functional mobility during ADLs: Min guard;Set up General ADL Comments: Session focus on sternal precaution education, ADLs, cognition, dovetail with PT for ambulation     Vision       Perception     Praxis      Cognition Arousal/Alertness: Awake/alert Behavior During Therapy: WFL for tasks assessed/performed;Impulsive Overall Cognitive Status: Impaired/Different from baseline Area of Impairment: Memory;Safety/judgement;Problem solving                   Current Attention Level: Alternating Memory: Decreased short-term memory;Decreased recall of precautions Following Commands: Follows one step commands consistently Safety/Judgement: Decreased awareness of deficits Awareness: Emergent Problem Solving: Slow processing;Requires verbal cues General Comments: Cognition continues to improve, however still impulsive in movement, therapist went to retrieve mask, asked pt stay at EOB, and pt was up stading in the middle of the room and had unplugged all this monitor cords        Exercises     Shoulder Instructions       General Comments      Pertinent Vitals/ Pain       Pain Assessment: No/denies pain  Home Living  Prior Functioning/Environment              Frequency  Min 2X/week        Progress Toward Goals  OT Goals(current goals can now be found in the care plan section)  Progress towards OT goals: Progressing toward goals  Acute Rehab OT Goals Patient Stated Goal: get better - wants to go home OT Goal Formulation: With patient Time For Goal Achievement: 11/20/19 Potential to Achieve Goals: Good  Plan Discharge plan remains appropriate    Co-evaluation                 AM-PAC OT "6 Clicks" Daily Activity     Outcome Measure   Help from another person eating meals?: None Help from another person taking care of personal grooming?: None Help from another person toileting,  which includes using toliet, bedpan, or urinal?: A Little Help from another person bathing (including washing, rinsing, drying)?: A Little Help from another person to put on and taking off regular upper body clothing?: A Little Help from another person to put on and taking off regular lower body clothing?: A Little 6 Click Score: 20    End of Session    OT Visit Diagnosis: Muscle weakness (generalized) (M62.81);Unsteadiness on feet (R26.81);Other (comment)   Activity Tolerance Patient tolerated treatment well   Patient Left Other (comment) (With PT for mobility)   Nurse Communication Mobility status        Time: 1033-1050 OT Time Calculation (min): 17 min  Charges: OT General Charges $OT Visit: 1 Visit OT Treatments $Self Care/Home Management : 8-22 mins  Corinne Ports E. Zain Lankford, COTA/L Acute Rehabilitation Services [place] 11/10/2019, 2:42 PM

## 2019-11-10 NOTE — Progress Notes (Signed)
Mobility Specialist: Progress Note    11/10/19 1331  Mobility  Activity Ambulated in hall  Level of Assistance Independent  Assistive Device None  Distance Ambulated (ft) 700 ft  Mobility Response Tolerated well  Mobility performed by Mobility specialist  $Mobility charge 1 Mobility   Pre-Mobility: 92 HR Post-Mobility: 88 HR, 91/67 BP  Pt had to take a standing rest break halfway through walk lasting a couple minutes w/ c/o SOB.   [facility] Adore Kithcart Mobility Specialist

## 2019-11-10 NOTE — Progress Notes (Signed)
Physical Therapy Treatment Patient Details Name: William Crawford MRN: [id number] DOB: 13-Mar-1971 Today's Date: 11/10/2019    History of Present Illness Pt is 49 yo male who presented with chest pressure, NSTEMI, underwent DES to distal RCA on 5/25. Had attempted intervention on CTO LAD that was complicated by LAD perforation with VF arrest. TEE showed severe biventricular dysfunction. Placed on ECMO, decannulated to impella 6/7, impella removed 6/11.In addition, suspected ileus. PMH: tobacco and EtOH abuse, GERD.     PT Comments    Pt was seen for mobility with O2 sats monitored on hallway, used pulse ox to check and RW for support and relief of pain and swelling on L foot.  Pt is reporting a history of gout to MD, and may have a flare triggered by surgery.  Nursing aware to talk with hospitalist and decide on tx.  Pt is otherwise able to walk with careful speech minimization, and did drop to 87% with one point in the effort.  After a standing rest and minimizing speech, kept O2 above 90%.   Follow Up Recommendations  Home health PT;Supervision/Assistance - 24 hour     Equipment Recommendations  None recommended by PT    Recommendations for Other Services OT consult;Rehab consult     Precautions / Restrictions Precautions Precautions: Sternal;Fall Precaution Booklet Issued: No Precaution Comments: Able to recall 2/3 sternal precautions now, intermittent combativeness per nursing Restrictions Weight Bearing Restrictions: Yes RUE Weight Bearing: Partial weight bearing Other Position/Activity Restrictions: sternal precautions, R chest tube    Mobility  Bed Mobility               General bed mobility comments: up OOB when PT arrived  Transfers Overall transfer level: Needs assistance Equipment used: 1 person hand held assist;Rolling walker (2 wheeled) Transfers: Sit to/from Stand Sit to Stand: Supervision Stand pivot transfers: Supervision       General transfer  comment: stood without assist  Ambulation/Gait Ambulation/Gait assistance: Min guard Gait Distance (Feet): 500 Feet Assistive device: Rolling walker (2 wheeled);1 person hand held assist Gait Pattern/deviations: Step-through pattern;Decreased stride length;Wide base of support     General Gait Details: pt was in pain on L foot from suspected gouty change, reported to MD and nursing   Stairs             Wheelchair Mobility    Modified Rankin (Stroke Patients Only)       Balance Overall balance assessment: Needs assistance Sitting-balance support: Feet supported Sitting balance-Leahy Scale: Good     Standing balance support: Bilateral upper extremity supported;During functional activity Standing balance-Leahy Scale: Good                              Cognition Arousal/Alertness: Awake/alert Behavior During Therapy: WFL for tasks assessed/performed Overall Cognitive Status: Within Functional Limits for tasks assessed Area of Impairment: Problem solving;Awareness;Safety/judgement                   Current Attention Level: Selective Memory: Decreased recall of precautions;Decreased short-term memory Following Commands: Follows one step commands inconsistently;Follows one step commands with increased time Safety/Judgement: Decreased awareness of safety;Decreased awareness of deficits Awareness: Intellectual Problem Solving: Slow processing;Requires verbal cues General Comments: reviewed the importance of monitoring vitals with mobility, pt wanted to move along      Exercises      General Comments        Pertinent Vitals/Pain Pain Assessment: Faces Faces Pain  Scale: Hurts even more Pain Location: L foot edema    Home Living                      Prior Function            PT Goals (current goals can now be found in the care plan section) Acute Rehab PT Goals Patient Stated Goal: get better - wants to go home Progress towards  PT goals: Progressing toward goals    Frequency    Min 3X/week      PT Plan Current plan remains appropriate    Co-evaluation              AM-PAC PT "6 Clicks" Mobility   Outcome Measure  Help needed turning from your back to your side while in a flat bed without using bedrails?: None Help needed moving from lying on your back to sitting on the side of a flat bed without using bedrails?: None Help needed moving to and from a bed to a chair (including a wheelchair)?: A Little Help needed standing up from a chair using your arms (e.g., wheelchair or bedside chair)?: A Little Help needed to walk in hospital room?: A Lot Help needed climbing 3-5 steps with a railing? : A Lot 6 Click Score: 18    End of Session Equipment Utilized During Treatment: Gait belt Activity Tolerance: Patient tolerated treatment well Patient left: with call bell/phone within reach;in bed;with nursing/sitter in room Nurse Communication: Mobility status PT Visit Diagnosis: Other abnormalities of gait and mobility (R26.89)     Time: 8127-5170 PT Time Calculation (min) (ACUTE ONLY): 27 min  Charges:  $Gait Training: 8-22 mins $Therapeutic Activity: 8-22 mins                   Ramond Dial 11/10/2019, 5:17 PM  Mee Hives, PT MS Acute Rehab Dept. Number: [place] and [place]

## 2019-11-10 NOTE — Progress Notes (Addendum)
      [address]       ,[place] [address]             [phone]      7 Days Post-Op Procedure(s) (LRB): REMOVAL OF IMPELLA 5.5 LEFT VENTRICULAR ASSIST DEVICE (N/A) TRANSESOPHAGEAL ECHOCARDIOGRAM (TEE) (N/A) Subjective: Pain in his chest this morning and asking for pain medicine. His last dose was 4:30am.   Objective: Vital signs in last 24 hours: Temp:  [98.1 F (36.7 C)-99.6 F (37.6 C)] 98.2 F (36.8 C) (06/18 0419) Pulse Rate:  [79-99] 79 (06/18 0419) Cardiac Rhythm: Normal sinus rhythm (06/18 0723) Resp:  [15-24] 17 (06/18 0419) BP: (92-126)/(61-94) 105/65 (06/18 0419) SpO2:  [86 %-100 %] 98 % (06/18 0419) Weight:  [80.2 kg] 80.2 kg (06/17 2205)     Intake/Output from previous day: 06/17 0701 - 06/18 0700 In: 1200 [P.O.:1200] Out: 2550 [Urine:2550] Intake/Output this shift: No intake/output data recorded.  General appearance: alert, cooperative and no distress Heart: sinus tachycardia Lungs: clear to auscultation bilaterally, diminished in the bilaterally lower lobes Abdomen: soft, non-tender; bowel sounds normal; no masses,  no organomegaly Extremities: extremities normal, atraumatic, no cyanosis or edema Wound: clean and dry with staples in place.  Lab Results: Recent Labs    11/09/19 0238 11/10/19 0335  WBC 14.0* 11.8*  HGB 11.8* 12.8*  HCT 37.0* 39.8  PLT 632* 701*   BMET:  Recent Labs    11/09/19 0238 11/10/19 0335  NA 138 137  K 3.7 4.3  CL 102 101  CO2 23 25  GLUCOSE 101* 86  BUN 20 21*  CREATININE 1.01 1.06  CALCIUM 8.3* 8.8*    PT/INR:  Recent Labs    11/10/19 0335  LABPROT 17.7*  INR 1.5*   ABG    Component Value Date/Time   PHART 7.545 (H) 11/04/2019 0501   HCO3 27.7 11/04/2019 0501   TCO2 29 11/04/2019 0501   ACIDBASEDEF 1.0 10/31/2019 0644   O2SAT 71.6 11/04/2019 1747   CBG (last 3)  Recent Labs    11/09/19 1827 11/09/19 2127 11/10/19 0607  GLUCAP 145* 97 96    Assessment/Plan: S/P  Procedure(s) (LRB): REMOVAL OF IMPELLA 5.5 LEFT VENTRICULAR ASSIST DEVICE (N/A) TRANSESOPHAGEAL ECHOCARDIOGRAM (TEE) (N/A)  1. CV-BP well controlled/ NSR in the 80. Off milrinone.  Continue Amio 451m BID, asa, statin, entresto and plavix/asa81. Appreciate HF assistance.  2. Pulm-tolerating room air with good oxygen saturation. No CXR this morning. Ordered CXR for the morning.  3. Renal-creatinine 1.06, electrolytes okay 4. H and H 12.8/39.8, expected acute blood loss anemia 5. Endo-blood glucose well controlled 6. Acute encephalopathy-continue Zyprexa and avoid narcotics  Plan: He is ambulating well on his own. PT now recommends home health with 24 hours supervision. The original plan was CIR and they continue to follow as well. Likely will be ready for discharge this weekend if heart failure agrees. Change dilaudid to PO q 4 hours in anticipation for d/c.    LOS: 16 days    TElgie Collard6/18/2021

## 2019-11-10 NOTE — Progress Notes (Addendum)
Patient ID: William Crawford, male   DOB: 1970/11/12, 49 y.o.   MRN: [id number]     Advanced Heart Failure Rounding Note  PCP-Cardiologist: No primary care provider on file.   Subjective:    - LAD repair + [facility] ECMO cannulation in OR 6/3. Chest wall open. - 6/7: ECMO decannulation, Impella 5.5 placement  - 6/11: Impella 5.5 removal.   - Echo (6/3): EF < 20%, severe RV dysfunction - Echo (6/6): EF 20-25%, normal RV function - Echo (6/8): EF 25-30%, normal RV - Echo (6/10): EF 40%, normal RV  Walking in the hall. Denies SOB. Complaining of left foot pain. Thinks he has gout.   Objective:   Weight Range: 80.2 kg Body mass index is 26.49 kg/m.   Vital Signs:   Temp:  [97.8 F (36.6 C)-99.6 F (37.6 C)] 97.8 F (36.6 C) (06/18 0838) Pulse Rate:  [79-99] 86 (06/18 0838) Resp:  [16-24] 18 (06/18 0838) BP: (92-126)/(61-94) 107/68 (06/18 0838) SpO2:  [86 %-100 %] 96 % (06/18 0838) Weight:  [80.2 kg] 80.2 kg (06/17 2205) Last BM Date: 11/09/19  Weight change: Filed Weights   11/06/19 0600 11/07/19 0500 11/09/19 2205  Weight: 87.3 kg 84.3 kg 80.2 kg    Intake/Output:   Intake/Output Summary (Last 24 hours) at 11/10/2019 1048 Last data filed at 11/10/2019 0841 Gross per 24 hour  Intake 1200 ml  Output 2250 ml  Net -1050 ml      Physical Exam    General:   No resp difficulty. Standing in the room  HEENT: normal Neck: supple. no JVD. Carotids 2+ bilat; no bruits. No lymphadenopathy or thryomegaly appreciated. Cor: PMI nondisplaced. Regular rate & rhythm. No rubs, gallops or murmurs. Sternal incision/staples approximated  Lungs: clear on room air.  Abdomen: soft, nontender, nondistended. No hepatosplenomegaly. No bruits or masses. Good bowel sounds. Extremities: no cyanosis, clubbing, rash, edema Neuro: alert & orientedx3, cranial nerves grossly intact. moves all 4 extremities w/o difficulty. Affect pleasant   Telemetry  SR-ST 90-100s   Labs    CBC Recent Labs     11/09/19 0238 11/10/19 0335  WBC 14.0* 11.8*  HGB 11.8* 12.8*  HCT 37.0* 39.8  MCV 90.2 89.2  PLT 632* 201*   Basic Metabolic Panel Recent Labs    11/09/19 0238 11/10/19 0335  NA 138 137  K 3.7 4.3  CL 102 101  CO2 23 25  GLUCOSE 101* 86  BUN 20 21*  CREATININE 1.01 1.06  CALCIUM 8.3* 8.8*   Liver Function Tests Recent Labs    11/09/19 0238 11/10/19 0335  AST 76* 79*  ALT 199* 193*  ALKPHOS 146* 145*  BILITOT 0.6 0.8  PROT 6.5 6.8  ALBUMIN 2.8* 3.0*   No results for input(s): LIPASE, AMYLASE in the last 72 hours. Cardiac Enzymes No results for input(s): CKTOTAL, CKMB, CKMBINDEX, TROPONINI in the last 72 hours.  BNP: BNP (last 3 results) Recent Labs    10/17/19 0605  BNP 608.8*    ProBNP (last 3 results) No results for input(s): PROBNP in the last 8760 hours.   D-Dimer No results for input(s): DDIMER in the last 72 hours. Hemoglobin A1C No results for input(s): HGBA1C in the last 72 hours. Fasting Lipid Panel No results for input(s): CHOL, HDL, LDLCALC, TRIG, CHOLHDL, LDLDIRECT in the last 72 hours. Thyroid Function Tests No results for input(s): TSH, T4TOTAL, T3FREE, THYROIDAB in the last 72 hours.  Invalid input(s): FREET3  Other results:   Imaging  No results found.   Medications:     Scheduled Medications: . amiodarone  400 mg Oral BID  . aspirin  81 mg Per Tube Daily  . atorvastatin  40 mg Oral Daily  . cephALEXin  500 mg Oral QID  . Chlorhexidine Gluconate Cloth  6 each Topical Daily  . clopidogrel  75 mg Oral Daily  . docusate sodium  100 mg Oral Daily  . feeding supplement (ENSURE ENLIVE)  237 mL Oral TID BM  . feeding supplement (PRO-STAT SUGAR FREE 64)  30 mL Oral BID  . folic acid  1 mg Oral Daily  . insulin aspart  0-9 Units Subcutaneous TID AC & HS  . ketotifen  1 drop Both Eyes BID  . OLANZapine  2.5 mg Oral QHS  . pantoprazole  40 mg Oral Q2000  . sacubitril-valsartan  1 tablet Oral BID  . sodium chloride flush   10-40 mL Intracatheter Q12H  . spironolactone  25 mg Oral Daily  . thiamine  100 mg Oral Daily  . torsemide  40 mg Oral Daily  . Warfarin - Pharmacist Dosing Inpatient   Does not apply q1600    Infusions: . sodium chloride Stopped (10/28/19 0227)  . sodium chloride Stopped (11/03/19 1425)    PRN Medications: sodium chloride, benzonatate, bisacodyl **OR** bisacodyl, Gerhardt's butt cream, hydrALAZINE, HYDROmorphone, metoprolol tartrate, midazolam, ondansetron (ZOFRAN) IV, sodium chloride flush, sodium chloride flush   Assessment/Plan   1. Cardiogenic shock: Pre-OR echo with EF 45%, apical hypokinesis.  TEE in OR after VF arrest showed severe biventricular dysfunction. Placed on ECMO. Repeat echo 6/6 with EF 20-25%, RV function improved/near normal. Decannulated to Impella 5.5 on 6/7. Impella 5.5 removed in OR 6/11 - off pressors/inotropes. NE and milrinone discontinued.  - Volume status stable. Continue torsemide 40 po daily - Continue Entresto 97-103 bid  - Continue spiro 25 mg daily  - no b-blocker yet 2. Pericardial tamponade: Due to LAD perforation.  Now resolved in OR.  3. CAD: S/p DES to distal RCA on 5/25.  Has known CTO OM.  Had attempted intervention on CTO LAD that was complicated by LAD perforation, now s/p LAD repair.  The LAD was not graftable, LAD territory therefore is not perfused except by pre-existing collaterals. - Has recent RCA stent - Continue DAPT w/ ASA + Plavix  - Continue statin, atorvastatin 40 daily.   4. ETOH abuse: - likely out of the window of WD 5. Smoking: Will need to quit.  6. Acute hypoxemic respiratory failure: improving - extubated.   -Now on room air. Sats stable.  7. Neuro: Nonfocal 8. GI: Suspect ileus. - resolved.  9. VF arrest: In OR.  Occasional PVCs.  - loaded w/ amio gtt. Now on PO 400 mg BID - Off lidocaine.  10. ID:  - WBC continues to trend down.  - Completed vancomycin/meropenem--> now on keflex.  11. RA thrombus: Discussed  with Dr. Prescott Gum by phone after device removal, there is some thrombus noted in RA on TEE in OR.   - INR 1.5. -  Coumadin/ heparin dosing per pharmacy  12. Severe protein-calorie malnutrition - prealbumin trending up 10>>17.  - advancing diet.  13. Acute encephalopathy - suspect component of anoxic brain injury due to long arrest. Seems to wax and wane - late-presenting ETOH WD unlikely but not impossible - avoid narcotics -  Continue Zyprexa 14. L foot pain ? Possible gout. Check uric acid.    Length of Stay: 16  Amy  Clegg, NP  11/10/2019, 10:48 AM  Advanced Heart Failure Team Pager [phone] (M-F; 7a - 4p)  Please contact [facility] for night-coverage after hours (4p -7a ) and weekends on [email or web address]  Patient seen and examined with the above-signed Advanced Practice Provider and/or Housestaff. I personally reviewed laboratory data, imaging studies and relevant notes. I independently examined the patient and formulated the important aspects of the plan. I have edited the note to reflect any of my changes or salient points. I have personally discussed the plan with the patient and/or family.  Stable from hemodynamic standpoint. Still somewhat confused at times. Left ankle sore. On heparin/warfarin INR 1.5  PT has seen. Now recommending HHPT.   General:  Sitting up in bed No resp difficulty HEENT: normal Neck: supple. no JVD. Carotids 2+ bilat; no bruits. No lymphadenopathy or thryomegaly appreciated. Cor: Incision ok PMI nondisplaced. Regular rate & rhythm. No rubs, gallops or murmurs. Lungs: clear Abdomen: soft, nontender, nondistended. No hepatosplenomegaly. No bruits or masses. Good bowel sounds. Extremities: no cyanosis, clubbing, rash, edema Neuro: alert & orientedx3, cranial nerves grossly intact. moves all 4 extremities w/o difficulty. Affect pleasant  Stable from cardiac standpoint. Continue heparin and load warfarin. Home when INR >= 2.0. .   Glori Bickers, MD    5:52 PM

## 2019-11-10 NOTE — Progress Notes (Signed)
Inpatient Rehab Admissions Coordinator:   Met with pt and his mother at the bedside.  Based on updated therapy recommendations, pt and his mother feel comfortable discharging home with [facility] f/u when medically ready.  Will sign off for CIR at this time.   Shann Medal, PT, DPT Admissions Coordinator [phone] 11/10/19  12:43 PM

## 2019-11-10 NOTE — Plan of Care (Signed)
  Problem: Education: Goal: Will demonstrate proper wound care and an understanding of methods to prevent future damage Outcome: Progressing   Problem: Education: Goal: Knowledge of disease or condition will improve Outcome: Progressing

## 2019-11-11 ENCOUNTER — Inpatient Hospital Stay (HOSPITAL_COMMUNITY): Payer: BLUE CROSS/BLUE SHIELD

## 2019-11-11 LAB — COMPREHENSIVE METABOLIC PANEL
ALT: 152 U/L — ABNORMAL HIGH (ref 0–44)
AST: 51 U/L — ABNORMAL HIGH (ref 15–41)
Albumin: 3.2 g/dL — ABNORMAL LOW (ref 3.5–5.0)
Alkaline Phosphatase: 148 U/L — ABNORMAL HIGH (ref 38–126)
Anion gap: 12 (ref 5–15)
BUN: 26 mg/dL — ABNORMAL HIGH (ref 6–20)
CO2: 24 mmol/L (ref 22–32)
Calcium: 8.9 mg/dL (ref 8.9–10.3)
Chloride: 98 mmol/L (ref 98–111)
Creatinine, Ser: 1.17 mg/dL (ref 0.61–1.24)
GFR calc Af Amer: >60 mL/min (ref >60)
GFR calc non Af Amer: >60 mL/min (ref >60)
Glucose, Bld: 92 mg/dL (ref 70–99)
Potassium: 4.1 mmol/L (ref 3.5–5.1)
Sodium: 134 mmol/L — ABNORMAL LOW (ref 135–145)
Total Bilirubin: 0.7 mg/dL (ref 0.3–1.2)
Total Protein: 7 g/dL (ref 6.5–8.1)

## 2019-11-11 LAB — PROTIME-INR
INR: 2.3 — ABNORMAL HIGH (ref 0.8–1.2)
Prothrombin Time: 24.3 seconds — ABNORMAL HIGH (ref 11.4–15.2)

## 2019-11-11 LAB — CBC
HCT: 39.3 % (ref 39.0–52.0)
Hemoglobin: 12.6 g/dL — ABNORMAL LOW (ref 13.0–17.0)
MCH: 28.6 pg (ref 26.0–34.0)
MCHC: 32.1 g/dL (ref 30.0–36.0)
MCV: 89.1 fL (ref 80.0–100.0)
Platelets: 844 10*3/uL — ABNORMAL HIGH (ref 150–400)
RBC: 4.41 MIL/uL (ref 4.22–5.81)
RDW: 13.8 % (ref 11.5–15.5)
WBC: 11.3 10*3/uL — ABNORMAL HIGH (ref 4.0–10.5)
nRBC: 0 % (ref 0.0–0.2)

## 2019-11-11 LAB — GLUCOSE, CAPILLARY
Glucose-Capillary: 89 mg/dL (ref 70–99)
Glucose-Capillary: 92 mg/dL (ref 70–99)

## 2019-11-11 LAB — LACTATE DEHYDROGENASE: LDH: 280 U/L — ABNORMAL HIGH (ref 98–192)

## 2019-11-11 MED ORDER — SODIUM CHLORIDE 0.9% FLUSH: 3.0000 mL | Freq: Two times a day (BID) | INTRAVENOUS | Status: DC

## 2019-11-11 MED ORDER — WARFARIN SODIUM 5 MG PO TABS
5.0000 mg | ORAL_TABLET | Freq: Once | ORAL | Status: AC
  Administered 2019-11-11: 5 mg via ORAL
  Filled 2019-11-11: qty 1

## 2019-11-11 MED ORDER — TORSEMIDE 20 MG PO TABS: 40.0000 mg | ORAL_TABLET | Freq: Every day | ORAL | 1 refills | Status: DC

## 2019-11-11 MED ORDER — KETOTIFEN FUMARATE 0.025 % OP SOLN: 1.0000 [drp] | Freq: Two times a day (BID) | OPHTHALMIC | 0 refills | Status: DC

## 2019-11-11 MED ORDER — CEPHALEXIN 500 MG PO CAPS: 500.0000 mg | ORAL_CAPSULE | Freq: Four times a day (QID) | ORAL | 0 refills | Status: AC

## 2019-11-11 MED ORDER — WARFARIN SODIUM 5 MG PO TABS: 5.0000 mg | ORAL_TABLET | Freq: Once | ORAL | 1 refills | Status: DC

## 2019-11-11 MED ORDER — AMIODARONE HCL 200 MG PO TABS: 200.0000 mg | ORAL_TABLET | Freq: Two times a day (BID) | ORAL | 1 refills | Status: DC

## 2019-11-11 MED ORDER — LIDOCAINE 4 % EX CREA: TOPICAL_CREAM | Freq: Two times a day (BID) | CUTANEOUS | 0 refills | Status: DC | PRN

## 2019-11-11 MED ORDER — ATORVASTATIN CALCIUM 40 MG PO TABS: 40.0000 mg | ORAL_TABLET | Freq: Every day | ORAL | 1 refills | Status: DC

## 2019-11-11 MED ORDER — LIDOCAINE 4 % EX CREA
TOPICAL_CREAM | Freq: Two times a day (BID) | CUTANEOUS | Status: DC | PRN
  Administered 2019-11-11: 1 via TOPICAL
  Filled 2019-11-11: qty 5

## 2019-11-11 MED ORDER — OLANZAPINE 2.5 MG PO TABS: 2.5000 mg | ORAL_TABLET | Freq: Every day | ORAL | 1 refills | Status: DC

## 2019-11-11 MED ORDER — SACUBITRIL-VALSARTAN 97-103 MG PO TABS: 1.0000 | ORAL_TABLET | Freq: Two times a day (BID) | ORAL | 1 refills | Status: DC

## 2019-11-11 MED ORDER — ENSURE ENLIVE PO LIQD: 237.0000 mL | Freq: Three times a day (TID) | ORAL | 12 refills | Status: DC

## 2019-11-11 MED ORDER — THIAMINE HCL 100 MG PO TABS: 100.0000 mg | ORAL_TABLET | Freq: Every day | ORAL | 1 refills | Status: DC

## 2019-11-11 MED ORDER — SPIRONOLACTONE 25 MG PO TABS: 25.0000 mg | ORAL_TABLET | Freq: Every day | ORAL | 1 refills | Status: DC

## 2019-11-11 MED ORDER — HYDROMORPHONE HCL 2 MG PO TABS: 1.0000 mg | ORAL_TABLET | Freq: Four times a day (QID) | ORAL | 0 refills | Status: DC | PRN

## 2019-11-11 NOTE — Progress Notes (Signed)
Mobility Specialist: Progress Note    11/11/19 1321  Mobility  Activity Ambulated in hall  Level of Assistance Independent  Assistive Device None  Distance Ambulated (ft) 470 ft  Mobility Response Tolerated well  Mobility performed by Mobility specialist  $Mobility charge 1 Mobility   Pre-Mobility: 98 HR Post-Mobility: 96 HR, 115/66 BP, 97% SpO2   Psychologist, clinical

## 2019-11-11 NOTE — Progress Notes (Signed)
[place] for warfarin Indication: ECMO > impella > R atrial thrombus  No Known Allergies  Patient Measurements: Height: 5' 8.5" (174 cm) Weight: 80.2 kg (176 lb 12.8 oz) IBW/kg (Calculated) : 69.55 Heparin Dosing Weight: 83.9 kg  Vital Signs: Temp: 97.9 F (36.6 C) (06/19 0749) Temp Source: Oral (06/19 0749) BP: 111/75 (06/19 0749) Pulse Rate: 97 (06/19 0749)  Labs: Recent Labs    11/09/19 0238 11/09/19 0238 11/10/19 0335 11/11/19 0253  HGB 11.8*   < > 12.8* 12.6*  HCT 37.0*  --  39.8 39.3  PLT 632*  --  701* 844*  LABPROT 17.7*  --  17.7* 24.3*  INR 1.5*  --  1.5* 2.3*  CREATININE 1.01  --  1.06 1.17   < > = values in this interval not displayed.    Estimated Creatinine Clearance: 76.8 mL/min (by C-G formula based on SCr of 1.17 mg/dL).   Medical History: Past Medical History:  Diagnosis Date  . Alcohol abuse   . GERD (gastroesophageal reflux disease)   . Tobacco abuse     Assessment: 49 year old male initiated on [place] ECMO> decannulated to impella 6/7.  Impella removed 6/11.  In OR, found RA thrombus noted on TEE.  INR today therapeutic at 2.3. H&H stable at 12.6/39.3, plts elevated and continuing to rise at 844. LFTs improving but still elevated. Please note that amiodarone may increase the anticoagulant effects of warfarin.  Goal of Therapy:  INR: 2-3 Monitor platelets by anticoagulation protocol: Yes   Plan:  Coumadin 5 mg x 1 tonight.  Plan for bASA for 30 days then plavix and warfarin thereafter Check INR and CBC daily  Monitor for s/sx bleeding   Thank you,   Eddie Candle, PharmD PGY-1 Pharmacy Resident   Please check amion for clinical pharmacist contact number

## 2019-11-11 NOTE — Progress Notes (Signed)
CARDIAC REHAB PHASE I   PRE:  Rate/Rhythm: 85 SR  BP:  Supine:   Sitting: 100/70  Standing:     SaO2: 97 RA  MODE:  Ambulation: 470 ft   POST:  Rate/Rhythm: 94 SR  BP:  Supine:   Sitting: 103/63  Standing:    SaO2: 97 RA 1155-1235 Pt walked 470 feet in hall independently. Gait steady, no c/o walking. VS stable. Pt back to side of bed after walk. Completed [facility] discharge education with pt. We discussed sternal precautions, wound care, use of IS, heart healthy diet, continued smoking cessation, exercise guidelines and Outpt. CRP. Pt voices understanding. Will send referral to [facility]. CRP. Placed recovery from heart surgery on for pt to watch.  Rodney Langton RN 11/11/2019 12:32 PM

## 2019-11-11 NOTE — Progress Notes (Signed)
Pt discharged home with mother.  All instructions given and reviewed, all questions answered.  Follow up appts in place.

## 2019-11-11 NOTE — Progress Notes (Addendum)
      [address]       [place] [address]             [phone]      8 Days Post-Op Procedure(s) (LRB): REMOVAL OF IMPELLA 5.5 LEFT VENTRICULAR ASSIST DEVICE (N/A) TRANSESOPHAGEAL ECHOCARDIOGRAM (TEE) (N/A) Subjective: Biggest issue this morning is his left hand. It looks to be bruised and then he states he hit it against the bed in the middle of the night which made things worse.   Objective: Vital signs in last 24 hours: Temp:  [97.4 F (36.3 C)-98.1 F (36.7 C)] 97.4 F (36.3 C) (06/19 0438) Pulse Rate:  [83-93] 89 (06/19 0438) Cardiac Rhythm: Normal sinus rhythm (06/19 0718) Resp:  [18-23] 18 (06/19 0438) BP: (103-137)/(60-74) 105/66 (06/19 0438) SpO2:  [96 %-98 %] 98 % (06/19 0438)     Intake/Output from previous day: 06/18 0701 - 06/19 0700 In: 240 [P.O.:240] Out: -  Intake/Output this shift: No intake/output data recorded.  General appearance: alert, cooperative and no distress Heart: regular rate and rhythm, S1, S2 normal, no murmur, click, rub or gallop Lungs: clear to auscultation bilaterally Abdomen: soft, non-tender; bowel sounds normal; no masses,  no organomegaly Extremities: extremities normal, atraumatic, no cyanosis or edema Wound: clean and dry sternal incision with staples in place  Lab Results: Recent Labs    11/10/19 0335 11/11/19 0253  WBC 11.8* 11.3*  HGB 12.8* 12.6*  HCT 39.8 39.3  PLT 701* 844*   BMET:  Recent Labs    11/10/19 0335 11/11/19 0253  NA 137 134*  K 4.3 4.1  CL 101 98  CO2 25 24  GLUCOSE 86 92  BUN 21* 26*  CREATININE 1.06 1.17  CALCIUM 8.8* 8.9    PT/INR:  Recent Labs    11/11/19 0253  LABPROT 24.3*  INR 2.3*   ABG    Component Value Date/Time   PHART 7.545 (H) 11/04/2019 0501   HCO3 27.7 11/04/2019 0501   TCO2 29 11/04/2019 0501   ACIDBASEDEF 1.0 10/31/2019 0644   O2SAT 71.6 11/04/2019 1747   CBG (last 3)  Recent Labs    11/10/19 1632 11/10/19 2157 11/11/19 0619  GLUCAP  90 101* 89    Assessment/Plan: S/P Procedure(s) (LRB): REMOVAL OF IMPELLA 5.5 LEFT VENTRICULAR ASSIST DEVICE (N/A) TRANSESOPHAGEAL ECHOCARDIOGRAM (TEE) (N/A)  1. CV-BP well controlled/ NSR in the 80. Off milrinone.  Continue Amio 435m BID, asa, statin, entresto and plavix/asa81. Appreciate HF assistance.  2. Pulm-tolerating room air with good oxygen saturation. No CXR this morning. Ordered CXR for the morning.  3. Renal-creatinine 1.17, electrolytes okay 4. H and H 12.6/39.9, expected acute blood loss anemia 5. Endo-blood glucose well controlled 6. Acute encephalopathy-continue Zyprexa and avoid narcotics 7. On coumadin- INR 2.3 8. Hand pain/hematoma- likely from IV infiltrate and then trauma during the night. Lidocaine cream and heat therapy today  Plan: Physical medicine had a discussion with the family yesterday and it was decided that home with home health was the best option. Will arrange. Also, he will need a coumadin INR appointment for Monday. I will change his Amio 4049mBID to 20010mID. Discharge today if HF agrees.    LOS: 17 days    TesElgie Collard19/2021   Chart reviewed, patient examined, agree with above. He feels well overall. CXR clear. I think he can go home.

## 2019-11-11 NOTE — Progress Notes (Signed)
Received referral to assist pt with HHPT/OT/RN. Met with pt. He plans to stay with his mother at time of D/C. Discussed [place] services. He doesn't have a preference for a [place] agency. Contacted Cheryl with [facility] for [facility] referral and she accepted it. Pt agreed with [facility].

## 2019-11-14 ENCOUNTER — Other Ambulatory Visit: Payer: Self-pay

## 2019-11-14 ENCOUNTER — Ambulatory Visit (INDEPENDENT_AMBULATORY_CARE_PROVIDER_SITE_OTHER): Payer: BLUE CROSS/BLUE SHIELD

## 2019-11-14 DIAGNOSIS — I513 Intracardiac thrombosis, not elsewhere classified: Secondary | ICD-10-CM

## 2019-11-14 DIAGNOSIS — Z5181 Encounter for therapeutic drug level monitoring: Secondary | ICD-10-CM | POA: Insufficient documentation

## 2019-11-14 LAB — POCT INR: INR: 5.3 — AB (ref 2.0–3.0)

## 2019-11-14 NOTE — Patient Instructions (Signed)
Description   Hold warfarin today (6/22) and tomorrow (6/23), then start taking 1/2 a tablet of warfarin daily. Recheck INR in 1 week. [phone].   A full discussion of the nature of anticoagulants has been carried out.  A benefit risk analysis has been presented to the patient, so that they understand the justification for choosing anticoagulation at this time. The need for frequent and regular monitoring, precise dosage adjustment and compliance is stressed.  Side effects of potential bleeding are discussed.  The patient should avoid any OTC items containing aspirin or ibuprofen, and should avoid great swings in general diet.  Avoid alcohol consumption.  Call if any signs of abnormal bleeding.

## 2019-11-16 ENCOUNTER — Other Ambulatory Visit: Payer: Self-pay | Admitting: Cardiothoracic Surgery

## 2019-11-16 DIAGNOSIS — E43 Unspecified severe protein-calorie malnutrition: Secondary | ICD-10-CM | POA: Diagnosis not present

## 2019-11-16 DIAGNOSIS — Z95811 Presence of heart assist device: Secondary | ICD-10-CM

## 2019-11-16 DIAGNOSIS — Z7901 Long term (current) use of anticoagulants: Secondary | ICD-10-CM | POA: Diagnosis not present

## 2019-11-16 DIAGNOSIS — I314 Cardiac tamponade: Secondary | ICD-10-CM | POA: Diagnosis not present

## 2019-11-16 DIAGNOSIS — G9340 Encephalopathy, unspecified: Secondary | ICD-10-CM | POA: Diagnosis not present

## 2019-11-16 DIAGNOSIS — F1010 Alcohol abuse, uncomplicated: Secondary | ICD-10-CM | POA: Diagnosis not present

## 2019-11-16 DIAGNOSIS — F17210 Nicotine dependence, cigarettes, uncomplicated: Secondary | ICD-10-CM | POA: Diagnosis not present

## 2019-11-16 DIAGNOSIS — J9601 Acute respiratory failure with hypoxia: Secondary | ICD-10-CM | POA: Diagnosis not present

## 2019-11-16 DIAGNOSIS — Z48812 Encounter for surgical aftercare following surgery on the circulatory system: Secondary | ICD-10-CM | POA: Diagnosis not present

## 2019-11-16 DIAGNOSIS — Z955 Presence of coronary angioplasty implant and graft: Secondary | ICD-10-CM | POA: Diagnosis not present

## 2019-11-16 DIAGNOSIS — I2510 Atherosclerotic heart disease of native coronary artery without angina pectoris: Secondary | ICD-10-CM | POA: Diagnosis not present

## 2019-11-17 ENCOUNTER — Ambulatory Visit
Admission: RE | Admit: 2019-11-17 | Discharge: 2019-11-17 | Disposition: A | Discharge status: Home / Self Care | Payer: BLUE CROSS/BLUE SHIELD | Source: Ambulatory Visit | Attending: Cardiothoracic Surgery | Admitting: Cardiothoracic Surgery

## 2019-11-17 ENCOUNTER — Encounter: Payer: Self-pay | Admitting: Cardiothoracic Surgery

## 2019-11-17 ENCOUNTER — Ambulatory Visit (INDEPENDENT_AMBULATORY_CARE_PROVIDER_SITE_OTHER): Payer: Self-pay | Admitting: Cardiothoracic Surgery

## 2019-11-17 ENCOUNTER — Other Ambulatory Visit: Payer: Self-pay

## 2019-11-17 VITALS — BP 101/70 | HR 88 | Resp 20 | Ht 68.5 in | Wt 171.0 lb

## 2019-11-17 DIAGNOSIS — J9811 Atelectasis: Secondary | ICD-10-CM | POA: Diagnosis not present

## 2019-11-17 DIAGNOSIS — Z95811 Presence of heart assist device: Secondary | ICD-10-CM

## 2019-11-17 DIAGNOSIS — Z09 Encounter for follow-up examination after completed treatment for conditions other than malignant neoplasm: Secondary | ICD-10-CM

## 2019-11-17 DIAGNOSIS — R570 Cardiogenic shock: Secondary | ICD-10-CM

## 2019-11-17 MED ORDER — HYDROMORPHONE HCL 2 MG PO TABS: ORAL_TABLET | ORAL | 0 refills | Status: DC

## 2019-11-17 MED ORDER — TRAMADOL HCL 50 MG PO TABS: 50.0000 mg | ORAL_TABLET | Freq: Four times a day (QID) | ORAL | 0 refills | Status: DC | PRN

## 2019-11-17 NOTE — Patient Instructions (Signed)
Heart attack     Medications  amiodarone (PACERONE) 200 MG tablet  atorvastatin (LIPITOR) 40 MG tablet  cetirizine (ZYRTEC) 10 MG tablet  clopidogrel (PLAVIX) 75 MG tablet  feeding supplement, ENSURE ENLIVE, (ENSURE ENLIVE) LIQD  HYDROmorphone (DILAUDID) 2 MG tablet  ketotifen (ZADITOR) 0.025 % ophthalmic solution  lidocaine (LMX) 4 % cream  OLANZapine (ZYPREXA) 2.5 MG tablet  sacubitril-valsartan (ENTRESTO) 97-103 MG  spironolactone (ALDACTONE) 25 MG tablet  thiamine 100 MG tablet  torsemide (DEMADEX) 20 MG tablet  warfarin (COUMADIN) 5 MG tablet    Tobacco History  2 items  Smoking Status  Current Every Day Smoker  Smokeless Tobacco Status  Never Used

## 2019-11-17 NOTE — Progress Notes (Signed)
PCP is Patient, No Pcp Per Referring Provider is Sherren Mocha, MD  Chief Complaint  Patient presents with  . 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  The patient has been home about a week.  He is doing very well.  He denies any cardiac symptoms of angina CHF orthopnea edema.  His main complaint is surgical pain over his anterior chest.  The surgical incisions are healing well and chest x-ray today is reviewed and is clear.  Sternal wires are intact and there is no signs of instability.  I reviewed the chest tube sutures and cannulation sutures today and every other sternal skin staple.   Past Medical History:  Diagnosis Date  . Alcohol abuse   . GERD (gastroesophageal reflux disease)   . Tobacco abuse     Past Surgical History:  Procedure Laterality Date  . APPLICATION OF WOUND VAC  10/25/2019   Procedure: Application Of Wound Vac;  Surgeon: [name], MD;  Location: [place];  Service: Open Heart Surgery;;  . CANNULATION FOR ECMO (EXTRACORPOREAL MEMBRANE OXYGENATION)  10/25/2019   Procedure: Cannulation For Ecmo (Extracorporeal Membrane Oxygenation) @ 1761;  Surgeon: [name], MD;  Location: [place];  Service: Open Heart Surgery;;  . CANNULATION FOR ECMO (EXTRACORPOREAL MEMBRANE OXYGENATION) N/A 10/30/2019   Procedure: DECANNULATION OF ECMO (EXTRACORPOREAL MEMBRANE OXYGENATION);  Surgeon: Prescott Gum, Collier Salina, MD;  Location: [place];  Service: Open Heart Surgery;  Laterality: N/A;  Pump standby  . CHEST EXPLORATION  10/25/2019   Procedure: Chest Exploration for repair of coronary perforation (repair of LAD);  Surgeon: Prescott Gum, Collier Salina, MD;   Location: [place];  Service: Open Heart Surgery;;  . CORONARY CTO INTERVENTION N/A 10/25/2019   Procedure: CORONARY CTO INTERVENTION;  Surgeon: Martinique, Eden Rho M, MD;  Location: [place] [facility];  Service: Cardiovascular;  Laterality: N/A;  . CORONARY STENT INTERVENTION N/A 10/17/2019   Procedure: CORONARY STENT INTERVENTION;  Surgeon: Sherren Mocha, MD;  Location: [place] [facility];  Service: Cardiovascular;  Laterality: N/A;  . LEFT HEART CATH AND CORONARY ANGIOGRAPHY N/A 10/17/2019   Procedure: LEFT HEART CATH AND CORONARY ANGIOGRAPHY;  Surgeon: Sherren Mocha, MD;  Location: [place] [facility];  Service: Cardiovascular;  Laterality: N/A;  . MEDIASTERNOTOMY  10/25/2019   Procedure: Median Sternotomy.;  Surgeon: [name], MD;  Location: [place];  Service: Open Heart Surgery;;  . PLACEMENT OF IMPELLA LEFT VENTRICULAR ASSIST DEVICE  10/30/2019   Procedure: Placement Of Impella Left Ventricular Assist Device 5.5 USING 10MM HEMASHIELD PLATINUM GRAFT;  Surgeon: [name], MD;  Location: [place];  Service: Open Heart Surgery;;  graft placed into aorta  . REMOVAL OF IMPELLA LEFT VENTRICULAR ASSIST DEVICE N/A 11/03/2019   Procedure: REMOVAL OF IMPELLA 5.5 LEFT VENTRICULAR ASSIST DEVICE;  Surgeon: [name], MD;  Location: [place];  Service: Open Heart Surgery;  Laterality: N/A;  . STERNAL CLOSURE N/A 10/30/2019   Procedure: Sternal Closure;  Surgeon: [name], MD;  Location: [place];  Service: Open Heart Surgery;  Laterality: N/A;  . TEE WITHOUT CARDIOVERSION N/A 10/25/2019   Procedure: TRANSESOPHAGEAL ECHOCARDIOGRAM (TEE);  Surgeon: Lucianne Lei  Donney Rankins, MD;  Location: [place];  Service: Open Heart Surgery;  Laterality: N/A;  . TEE WITHOUT CARDIOVERSION N/A 10/30/2019   Procedure: TRANSESOPHAGEAL ECHOCARDIOGRAM (TEE);  Surgeon: Prescott Gum, Collier Salina, MD;  Location: [place];  Service: Open Heart Surgery;  Laterality: N/A;  . TEE WITHOUT CARDIOVERSION N/A 11/03/2019   Procedure: TRANSESOPHAGEAL  ECHOCARDIOGRAM (TEE);  Surgeon: Prescott Gum, Collier Salina, MD;  Location: [place];  Service: Open Heart Surgery;  Laterality: N/A;    Family History  Problem Relation Age of Onset  . Heart attack Father     Social History Social History   Tobacco Use  . Smoking status: Current Every Day Smoker  . Smokeless tobacco: Never Used  Substance Use Topics  . Alcohol use: Yes  . Drug use: No    Current Outpatient Medications  Medication Sig Dispense Refill  . amiodarone (PACERONE) 200 MG tablet Take 1 tablet (200 mg total) by mouth 2 (two) times daily. 60 tablet 1  . atorvastatin (LIPITOR) 40 MG tablet Take 1 tablet (40 mg total) by mouth daily. 30 tablet 1  . cetirizine (ZYRTEC) 10 MG tablet Take 10 mg by mouth daily.    . clopidogrel (PLAVIX) 75 MG tablet Take 1 tablet (75 mg total) by mouth daily. 90 tablet 2  . feeding supplement, ENSURE ENLIVE, (ENSURE ENLIVE) LIQD Take 237 mLs by mouth 3 (three) times daily between meals. 237 mL 12  . HYDROmorphone (DILAUDID) 2 MG tablet Take 0.5 tablets (1 mg total) by mouth every 6 (six) hours as needed for severe pain. 30 tablet 0  . ketotifen (ZADITOR) 0.025 % ophthalmic solution Place 1 drop into both eyes 2 (two) times daily. 5 mL 0  . lidocaine (LMX) 4 % cream Apply topically 2 (two) times daily as needed (pain). 30 g 0  . OLANZapine (ZYPREXA) 2.5 MG tablet Take 1 tablet (2.5 mg total) by mouth at bedtime. 30 tablet 1  . sacubitril-valsartan (ENTRESTO) 97-103 MG Take 1 tablet by mouth 2 (two) times daily. 60 tablet 1  . spironolactone (ALDACTONE) 25 MG tablet Take 1 tablet (25 mg total) by mouth daily. 30 tablet 1  . thiamine 100 MG tablet Take 1 tablet (100 mg total) by mouth daily. 30 tablet 1  . torsemide (DEMADEX) 20 MG tablet Take 2 tablets (40 mg total) by mouth daily. 30 tablet 1  . warfarin (COUMADIN) 5 MG tablet Take 1 tablet (5 mg total) by mouth one time only at 4 PM. (Patient taking differently: Take 2.5 mg by mouth one time only at 4 PM. ) 30  tablet 1  . traMADol (ULTRAM) 50 MG tablet Take 1 tablet (50 mg total) by mouth every 6 (six) hours as needed. 40 tablet 0   No current facility-administered medications for this visit.    No Known Allergies  Review of Systems  Gradually improving strength and exercise tolerance No fever No productive cough Slight sore throat from intubation  BP 101/70   Pulse 88   Resp 20   Ht 5' 8.5" (1.74 m)   Wt 171 lb (77.6 kg)   SpO2 100% Comment: RA  BMI 25.62 kg/m  Physical Exam      Exam    General- alert and comfortable.  Sternal incision healing well.    Neck- no JVD, no cervical adenopathy palpable, no carotid bruit   Lungs- clear without rales, wheezes   Cor- regular rate and rhythm, no murmur , gallop   Abdomen- soft, non-tender   Extremities -  warm, non-tender, minimal edema   Neuro- oriented, appropriate, no focal weakness   Diagnostic Tests: Chest x-ray clear  Impression: Continue recovery after long hospitalization from cardiogenic shock following tamponade in the Cath Lab. I will order more pain medicine We will see the patient back for a wound check and removal of more sternal staples next week. Otherwise continue current meds, no driving, no lifting more than 5 pounds.  Plan: Return in 1 week for wound check and assessment of progress  Len Childs, MD [facility] [phone]

## 2019-11-20 ENCOUNTER — Ambulatory Visit (INDEPENDENT_AMBULATORY_CARE_PROVIDER_SITE_OTHER): Payer: BLUE CROSS/BLUE SHIELD | Admitting: *Deleted

## 2019-11-20 ENCOUNTER — Other Ambulatory Visit: Payer: Self-pay

## 2019-11-20 DIAGNOSIS — F17210 Nicotine dependence, cigarettes, uncomplicated: Secondary | ICD-10-CM | POA: Diagnosis not present

## 2019-11-20 DIAGNOSIS — I513 Intracardiac thrombosis, not elsewhere classified: Secondary | ICD-10-CM | POA: Diagnosis not present

## 2019-11-20 DIAGNOSIS — G9340 Encephalopathy, unspecified: Secondary | ICD-10-CM | POA: Diagnosis not present

## 2019-11-20 DIAGNOSIS — Z5181 Encounter for therapeutic drug level monitoring: Secondary | ICD-10-CM | POA: Diagnosis not present

## 2019-11-20 DIAGNOSIS — I2510 Atherosclerotic heart disease of native coronary artery without angina pectoris: Secondary | ICD-10-CM | POA: Diagnosis not present

## 2019-11-20 DIAGNOSIS — J9601 Acute respiratory failure with hypoxia: Secondary | ICD-10-CM | POA: Diagnosis not present

## 2019-11-20 DIAGNOSIS — Z48812 Encounter for surgical aftercare following surgery on the circulatory system: Secondary | ICD-10-CM | POA: Diagnosis not present

## 2019-11-20 DIAGNOSIS — E43 Unspecified severe protein-calorie malnutrition: Secondary | ICD-10-CM | POA: Diagnosis not present

## 2019-11-20 DIAGNOSIS — Z955 Presence of coronary angioplasty implant and graft: Secondary | ICD-10-CM | POA: Diagnosis not present

## 2019-11-20 DIAGNOSIS — Z7901 Long term (current) use of anticoagulants: Secondary | ICD-10-CM | POA: Diagnosis not present

## 2019-11-20 DIAGNOSIS — I314 Cardiac tamponade: Secondary | ICD-10-CM | POA: Diagnosis not present

## 2019-11-20 DIAGNOSIS — F1010 Alcohol abuse, uncomplicated: Secondary | ICD-10-CM | POA: Diagnosis not present

## 2019-11-20 LAB — POCT INR: INR: 2.8 (ref 2.0–3.0)

## 2019-11-20 NOTE — Patient Instructions (Signed)
Description   Continue taking 1/2 tablet of warfarin daily. Recheck INR in 1 week. [phone].

## 2019-11-21 DIAGNOSIS — I2510 Atherosclerotic heart disease of native coronary artery without angina pectoris: Secondary | ICD-10-CM | POA: Diagnosis not present

## 2019-11-21 DIAGNOSIS — F17210 Nicotine dependence, cigarettes, uncomplicated: Secondary | ICD-10-CM | POA: Diagnosis not present

## 2019-11-21 DIAGNOSIS — J9601 Acute respiratory failure with hypoxia: Secondary | ICD-10-CM | POA: Diagnosis not present

## 2019-11-21 DIAGNOSIS — Z955 Presence of coronary angioplasty implant and graft: Secondary | ICD-10-CM | POA: Diagnosis not present

## 2019-11-21 DIAGNOSIS — Z7901 Long term (current) use of anticoagulants: Secondary | ICD-10-CM | POA: Diagnosis not present

## 2019-11-21 DIAGNOSIS — I314 Cardiac tamponade: Secondary | ICD-10-CM | POA: Diagnosis not present

## 2019-11-21 DIAGNOSIS — Z48812 Encounter for surgical aftercare following surgery on the circulatory system: Secondary | ICD-10-CM | POA: Diagnosis not present

## 2019-11-21 DIAGNOSIS — G9340 Encephalopathy, unspecified: Secondary | ICD-10-CM | POA: Diagnosis not present

## 2019-11-21 DIAGNOSIS — E43 Unspecified severe protein-calorie malnutrition: Secondary | ICD-10-CM | POA: Diagnosis not present

## 2019-11-21 DIAGNOSIS — F1010 Alcohol abuse, uncomplicated: Secondary | ICD-10-CM | POA: Diagnosis not present

## 2019-11-22 ENCOUNTER — Other Ambulatory Visit: Payer: Self-pay

## 2019-11-22 ENCOUNTER — Encounter: Payer: Self-pay | Admitting: Cardiothoracic Surgery

## 2019-11-22 ENCOUNTER — Ambulatory Visit (INDEPENDENT_AMBULATORY_CARE_PROVIDER_SITE_OTHER): Payer: Self-pay | Admitting: Cardiothoracic Surgery

## 2019-11-22 VITALS — BP 105/71 | HR 91 | Temp 98.9°F | Resp 24 | Ht 68.5 in | Wt 176.0 lb

## 2019-11-22 DIAGNOSIS — Z09 Encounter for follow-up examination after completed treatment for conditions other than malignant neoplasm: Secondary | ICD-10-CM

## 2019-11-22 DIAGNOSIS — Z9281 Personal history of extracorporeal membrane oxygenation (ECMO): Secondary | ICD-10-CM

## 2019-11-22 DIAGNOSIS — Z95811 Presence of heart assist device: Secondary | ICD-10-CM

## 2019-11-22 NOTE — Progress Notes (Signed)
PCP is Patient, No Pcp Per Referring Provider is Sherren Mocha, MD  Chief Complaint  Patient presents with  . Routine Post Op    s/p impella removal 11/03/19    HPI: Patient returns for wound check. Continues to progress since his office visit last week. Walking up to 15 minutes at a time. Having mild sternal soreness and numbness but incision is healing well and no sternal click or swelling.  Pressures at home have been 697-948 systolic.  Weight stable.  INR today 2.0 on Coumadin 2.5 mg daily.  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  Past Medical History:  Diagnosis Date  . Alcohol abuse   . GERD (gastroesophageal reflux disease)   . Tobacco abuse     Past Surgical History:  Procedure Laterality Date  . APPLICATION OF WOUND VAC  10/25/2019   Procedure: Application Of Wound Vac;  Surgeon: [name], MD;  Location: [place];  Service: Open Heart Surgery;;  . CANNULATION FOR ECMO (EXTRACORPOREAL MEMBRANE OXYGENATION)  10/25/2019   Procedure: Cannulation For Ecmo (Extracorporeal Membrane Oxygenation) @ 0165;  Surgeon: [name], MD;  Location: [place];  Service: Open Heart Surgery;;  . CANNULATION FOR ECMO (EXTRACORPOREAL MEMBRANE OXYGENATION) N/A 10/30/2019   Procedure: DECANNULATION OF ECMO (EXTRACORPOREAL MEMBRANE OXYGENATION);  Surgeon: Prescott Gum, Collier Salina, MD;  Location: [place];  Service: Open Heart Surgery;  Laterality: N/A;  Pump standby  . CHEST EXPLORATION  10/25/2019   Procedure: Chest Exploration for repair of coronary perforation (repair of LAD);  Surgeon: Prescott Gum, Collier Salina, MD;  Location: [place];  Service: Open Heart Surgery;;  . CORONARY CTO INTERVENTION N/A 10/25/2019   Procedure: CORONARY CTO INTERVENTION;  Surgeon: Martinique, Keoshia Steinmetz M, MD;  Location: [place] [facility];  Service: Cardiovascular;  Laterality: N/A;  . CORONARY STENT INTERVENTION N/A 10/17/2019   Procedure: CORONARY STENT INTERVENTION;  Surgeon: Sherren Mocha,  MD;  Location: [place] [facility];  Service: Cardiovascular;  Laterality: N/A;  . LEFT HEART CATH AND CORONARY ANGIOGRAPHY N/A 10/17/2019   Procedure: LEFT HEART CATH AND CORONARY ANGIOGRAPHY;  Surgeon: Sherren Mocha, MD;  Location: [place] [facility];  Service: Cardiovascular;  Laterality: N/A;  . MEDIASTERNOTOMY  10/25/2019   Procedure: Median Sternotomy.;  Surgeon: [name], MD;  Location: [place];  Service: Open Heart Surgery;;  . PLACEMENT OF IMPELLA LEFT VENTRICULAR ASSIST DEVICE  10/30/2019   Procedure: Placement Of Impella Left Ventricular Assist Device 5.5 USING 10MM HEMASHIELD PLATINUM GRAFT;  Surgeon: [name], MD;  Location: [place];  Service: Open Heart Surgery;;  graft placed into aorta  . REMOVAL OF IMPELLA LEFT VENTRICULAR ASSIST DEVICE N/A 11/03/2019   Procedure: REMOVAL OF IMPELLA 5.5 LEFT VENTRICULAR ASSIST DEVICE;  Surgeon: [name], MD;  Location: [place];  Service: Open Heart Surgery;  Laterality: N/A;  . STERNAL CLOSURE N/A 10/30/2019   Procedure: Sternal Closure;  Surgeon: [name], MD;  Location: [place];  Service: Open Heart Surgery;  Laterality: N/A;  . TEE WITHOUT CARDIOVERSION N/A 10/25/2019   Procedure: TRANSESOPHAGEAL ECHOCARDIOGRAM (TEE);  Surgeon: Prescott Gum, Collier Salina, MD;  Location: [place];  Service: Open Heart Surgery;  Laterality: N/A;  . TEE WITHOUT CARDIOVERSION N/A 10/30/2019   Procedure: TRANSESOPHAGEAL ECHOCARDIOGRAM (TEE);  Surgeon: Prescott Gum, Collier Salina, MD;  Location: [place];  Service: Open Heart Surgery;  Laterality: N/A;  . TEE WITHOUT CARDIOVERSION N/A 11/03/2019   Procedure: TRANSESOPHAGEAL ECHOCARDIOGRAM (TEE);  Surgeon:  [name], MD;  Location: [place];  Service: Open Heart Surgery;  Laterality: N/A;    Family History  Problem Relation Age of Onset  . Heart attack Father     Social History Social History   Tobacco Use  . Smoking status: Current Every Day Smoker  . Smokeless tobacco: Never Used  Substance Use Topics  . Alcohol  use: Yes  . Drug use: No    Current Outpatient Medications  Medication Sig Dispense Refill  . amiodarone (PACERONE) 200 MG tablet Take 1 tablet (200 mg total) by mouth 2 (two) times daily. 60 tablet 1  . atorvastatin (LIPITOR) 40 MG tablet Take 1 tablet (40 mg total) by mouth daily. 30 tablet 1  . cetirizine (ZYRTEC) 10 MG tablet Take 10 mg by mouth daily.    . clopidogrel (PLAVIX) 75 MG tablet Take 1 tablet (75 mg total) by mouth daily. 90 tablet 2  . feeding supplement, ENSURE ENLIVE, (ENSURE ENLIVE) LIQD Take 237 mLs by mouth 3 (three) times daily between meals. 237 mL 12  . HYDROmorphone (DILAUDID) 2 MG tablet Take 1 tablet 3 times a day for 1 week then 1 tablet twice a day for 2 weeks 30 tablet 0  . ketotifen (ZADITOR) 0.025 % ophthalmic solution Place 1 drop into both eyes 2 (two) times daily. 5 mL 0  . OLANZapine (ZYPREXA) 2.5 MG tablet Take 1 tablet (2.5 mg total) by mouth at bedtime. 30 tablet 1  . sacubitril-valsartan (ENTRESTO) 97-103 MG Take 1 tablet by mouth 2 (two) times daily. 60 tablet 1  . spironolactone (ALDACTONE) 25 MG tablet Take 1 tablet (25 mg total) by mouth daily. 30 tablet 1  . thiamine 100 MG tablet Take 1 tablet (100 mg total) by mouth daily. 30 tablet 1  . torsemide (DEMADEX) 20 MG tablet Take 2 tablets (40 mg total) by mouth daily. 30 tablet 1  . traMADol (ULTRAM) 50 MG tablet Take 1 tablet (50 mg total) by mouth every 6 (six) hours as needed. 40 tablet 0  . warfarin (COUMADIN) 5 MG tablet Take 1 tablet (5 mg total) by mouth one time only at 4 PM. (Patient taking differently: Take 2.5 mg by mouth one time only at 4 PM. ) 30 tablet 1   No current facility-administered medications for this visit.    No Known Allergies  Review of Systems No complaints other than those noted above BP 105/71 (BP Location: Right Arm, Patient Position: Sitting, Cuff Size: Normal)   Pulse 91   Temp 98.9 F (37.2 C) (Temporal)   Resp (!) 24   Ht 5' 8.5" (1.74 m)   Wt 176 lb  (79.8 kg)   SpO2 98%   BMI 26.37 kg/m  Physical Exam      Exam    General- alert and comfortable.  Sternal incision well-healed.    Neck- no JVD, no cervical adenopathy palpable, no carotid bruit   Lungs- clear without rales, wheezes   Cor- regular rate and rhythm, no murmur , gallop   Abdomen- soft, non-tender   Extremities - warm, non-tender, minimal edema   Neuro- oriented, appropriate, no focal weakness   Diagnostic Tests: None  Impression:  Patient continues to progress after emergency sternotomy, ECMO cannulation, delayed chest closure, Impella 5.5 support and then recovery. Plan return with chest x-ray and review progress in 3 weeks.  Continue to limit lifting and no driving.:   Len Childs, MD [facility] [phone]

## 2019-11-22 NOTE — Progress Notes (Signed)
Easily removed 18 staples from pt's sternal incision and 3 sutures from R upper chest/neck incision per standard protocol. Incisions are well-aproximated and without s/s of infection. Advised to keep clean and dry and to report s/s of infection.

## 2019-11-24 ENCOUNTER — Encounter (HOSPITAL_COMMUNITY): Payer: Self-pay

## 2019-11-24 ENCOUNTER — Ambulatory Visit (HOSPITAL_COMMUNITY)
Admission: RE | Admit: 2019-11-24 | Discharge: 2019-11-24 | Disposition: A | Discharge status: Home / Self Care | Payer: BLUE CROSS/BLUE SHIELD | Source: Ambulatory Visit | Attending: Cardiology | Admitting: Cardiology

## 2019-11-24 ENCOUNTER — Telehealth: Payer: Self-pay

## 2019-11-24 ENCOUNTER — Other Ambulatory Visit: Payer: Self-pay

## 2019-11-24 VITALS — BP 104/78 | HR 92 | Wt 174.0 lb

## 2019-11-24 DIAGNOSIS — Z48812 Encounter for surgical aftercare following surgery on the circulatory system: Secondary | ICD-10-CM | POA: Diagnosis not present

## 2019-11-24 DIAGNOSIS — Z79899 Other long term (current) drug therapy: Secondary | ICD-10-CM | POA: Diagnosis not present

## 2019-11-24 DIAGNOSIS — I2510 Atherosclerotic heart disease of native coronary artery without angina pectoris: Secondary | ICD-10-CM | POA: Insufficient documentation

## 2019-11-24 DIAGNOSIS — Z7902 Long term (current) use of antithrombotics/antiplatelets: Secondary | ICD-10-CM | POA: Diagnosis not present

## 2019-11-24 DIAGNOSIS — Z7901 Long term (current) use of anticoagulants: Secondary | ICD-10-CM | POA: Diagnosis not present

## 2019-11-24 DIAGNOSIS — Z8249 Family history of ischemic heart disease and other diseases of the circulatory system: Secondary | ICD-10-CM | POA: Diagnosis not present

## 2019-11-24 DIAGNOSIS — Z79891 Long term (current) use of opiate analgesic: Secondary | ICD-10-CM | POA: Insufficient documentation

## 2019-11-24 DIAGNOSIS — I255 Ischemic cardiomyopathy: Secondary | ICD-10-CM | POA: Insufficient documentation

## 2019-11-24 DIAGNOSIS — I314 Cardiac tamponade: Secondary | ICD-10-CM | POA: Diagnosis not present

## 2019-11-24 DIAGNOSIS — I5022 Chronic systolic (congestive) heart failure: Secondary | ICD-10-CM | POA: Insufficient documentation

## 2019-11-24 DIAGNOSIS — E43 Unspecified severe protein-calorie malnutrition: Secondary | ICD-10-CM | POA: Diagnosis not present

## 2019-11-24 DIAGNOSIS — F17210 Nicotine dependence, cigarettes, uncomplicated: Secondary | ICD-10-CM | POA: Diagnosis not present

## 2019-11-24 DIAGNOSIS — I5082 Biventricular heart failure: Secondary | ICD-10-CM | POA: Insufficient documentation

## 2019-11-24 DIAGNOSIS — Z955 Presence of coronary angioplasty implant and graft: Secondary | ICD-10-CM | POA: Diagnosis not present

## 2019-11-24 DIAGNOSIS — I252 Old myocardial infarction: Secondary | ICD-10-CM | POA: Insufficient documentation

## 2019-11-24 DIAGNOSIS — G9340 Encephalopathy, unspecified: Secondary | ICD-10-CM | POA: Diagnosis not present

## 2019-11-24 DIAGNOSIS — J9601 Acute respiratory failure with hypoxia: Secondary | ICD-10-CM | POA: Diagnosis not present

## 2019-11-24 DIAGNOSIS — Z95811 Presence of heart assist device: Secondary | ICD-10-CM | POA: Diagnosis not present

## 2019-11-24 DIAGNOSIS — Z87891 Personal history of nicotine dependence: Secondary | ICD-10-CM | POA: Insufficient documentation

## 2019-11-24 DIAGNOSIS — F1010 Alcohol abuse, uncomplicated: Secondary | ICD-10-CM | POA: Diagnosis not present

## 2019-11-24 LAB — CBC
HCT: 42.4 % (ref 39.0–52.0)
Hemoglobin: 13.1 g/dL (ref 13.0–17.0)
MCH: 28.1 pg (ref 26.0–34.0)
MCHC: 30.9 g/dL (ref 30.0–36.0)
MCV: 90.8 fL (ref 80.0–100.0)
Platelets: 323 10*3/uL (ref 150–400)
RBC: 4.67 MIL/uL (ref 4.22–5.81)
RDW: 13.4 % (ref 11.5–15.5)
WBC: 7.2 10*3/uL (ref 4.0–10.5)
nRBC: 0 % (ref 0.0–0.2)

## 2019-11-24 LAB — COMPREHENSIVE METABOLIC PANEL
ALT: 80 U/L — ABNORMAL HIGH (ref 0–44)
AST: 36 U/L (ref 15–41)
Albumin: 3.9 g/dL (ref 3.5–5.0)
Alkaline Phosphatase: 155 U/L — ABNORMAL HIGH (ref 38–126)
Anion gap: 13 (ref 5–15)
BUN: 13 mg/dL (ref 6–20)
CO2: 23 mmol/L (ref 22–32)
Calcium: 9.3 mg/dL (ref 8.9–10.3)
Chloride: 101 mmol/L (ref 98–111)
Creatinine, Ser: 1.08 mg/dL (ref 0.61–1.24)
GFR calc Af Amer: >60 mL/min (ref >60)
GFR calc non Af Amer: >60 mL/min (ref >60)
Glucose, Bld: 92 mg/dL (ref 70–99)
Potassium: 4.1 mmol/L (ref 3.5–5.1)
Sodium: 137 mmol/L (ref 135–145)
Total Bilirubin: 0.6 mg/dL (ref 0.3–1.2)
Total Protein: 7.4 g/dL (ref 6.5–8.1)

## 2019-11-24 LAB — TSH: TSH: 3.899 u[IU]/mL (ref 0.350–4.500)

## 2019-11-24 NOTE — Patient Instructions (Signed)
It was great to see you today! No medication changes are needed at this time.  Labs today We will only contact you if something comes back abnormal or we need to make some changes. Otherwise no news is good news!  Your physician recommends that you schedule a follow-up appointment in: 6-8 weeks with Dr Haroldine Laws  Do the following things EVERYDAY: 1) Weigh yourself in the morning before breakfast. Write it down and keep it in a log. 2) Take your medicines as prescribed 3) Eat low salt foods--Limit salt (sodium) to 2000 mg per day.  4) Stay as active as you can everyday 5) Limit all fluids for the day to less than 2 liters  At the [facility], you and your health needs are our priority. As part of our continuing mission to provide you with exceptional heart care, we have created designated Provider Care Teams. These Care Teams include your primary Cardiologist (physician) and Advanced Practice Providers (APPs- Physician Assistants and Nurse Practitioners) who all work together to provide you with the care you need, when you need it.   You may see any of the following providers on your designated Care Team at your next follow up: Marland Kitchen Dr Glori Bickers . Dr Loralie Champagne . Darrick Grinder, NP . Lyda Jester, PA . Audry Riles, PharmD   Please be sure to bring in all your medications bottles to every appointment.

## 2019-11-24 NOTE — Progress Notes (Signed)
Advanced Heart Failure Clinic Note   Referring Physician: PCP: Patient, No Pcp Per PCP-Cardiologist: Dr. Burt Knack AHF: Dr. Aundra Dubin   HPI:  49 y.o. with history of heavy smoking, heavy ETOH, and strong FH of CAD was initially admitted on 10/17/19 with chest pain and NSTEMI.  LHC showed 95% distal RCA (culprit) with chronically occluded proximal LAD and OM1 with collaterals.  He had DES to distal RCA initially.  Echo showed EF 35-40% with inferior wall motion abnormality and normal RV.  He was discharged home with plan for CTO intervention on LAD.  He came for elective CTO procedure on 10/25/19 however he had perforation of the LAD with balloon inflation.  A covered stent was placed.  Echo showed EF 45% with apical hypokinesis, moderate pericardial effusion and evidence for tamponade.  He was then taken to the OR for LAD repair.   In the OR, he had a VF arrest with about 15 minutes of CPR.  He was put on cardiopulmonary bypass and the tear in the LAD was repaired.  The LAD was not graftable.  TEE in the OR showed severe biventricular failure, suspect stunning post-VF arrest.  He was unable to come off bypass.  Therefore, he was cannulated for ECMO with venous drainage from RA and arterial return to ascending aorta.  LV vent was not placed. Required inotropic/ pressor support w/ milrinone and NE. Patient was brought back to the OR on 6/7 for decannulation of ECMO and placement of an Impella Left Ventricular Assist Device. He had serial echocardiograms which showed continued improvement in LV with decreased LV size.  - Echo (6/3): EF < 20%, severe RV dysfunction - Echo (6/6): EF 20-25%, normal RV function - Echo (6/8): EF 25-30%, normal RV - Echo (6/10): EF 40%, normal RV    On 6/11 he underwent removal of Impella 5.5 left ventricular device. Of note, intraoperative TEE showed thrombus noted in RA. This was treated w/ coumadin + heparin bridge. He was successfully weaned off NE and milrinone. Placed on  GDMT for systolic HF, w/ exception of  blocker. He was continued PO on amiodarone given recent VF arrest. He progressed well and ultimately discharged home on 6/21.   He presents to clinic for post hospital f/u. Here w/ his mother. Doing well from a cardiac standpoint. Denies dyspnea. No CP. SR on EKG. BP stable. No LEE, orthopnea, PND, palpitations, syncope/ near syncope. Reports full med compliance. Has quit smoking. Had his first post op f/u w/ Dr. Prescott Gum earlier this week. No med changes made. Remains on driving and lifting restrictions. Has been ambulating for exercise up to 15 min walks a day. No exertional symptoms or limitations.     Review of Systems: [y] = yes, [ ]  = no   General: Weight gain [ ] ; Weight loss [ ] ; Anorexia [ ] ; Fatigue [ ] ; Fever [ ] ; Chills [ ] ; Weakness [ ]   Cardiac: Chest pain/pressure [ ] ; Resting SOB [ ] ; Exertional SOB [ ] ; Orthopnea [ ] ; Pedal Edema [ ] ; Palpitations [ ] ; Syncope [ ] ; Presyncope [ ] ; Paroxysmal nocturnal dyspnea[ ]   Pulmonary: Cough [ ] ; Wheezing[ ] ; Hemoptysis[ ] ; Sputum [ ] ; Snoring [ ]   GI: Vomiting[ ] ; Dysphagia[ ] ; Melena[ ] ; Hematochezia [ ] ; Heartburn[ ] ; Abdominal pain [ ] ; Constipation [ ] ; Diarrhea [ ] ; BRBPR [ ]   GU: Hematuria[ ] ; Dysuria [ ] ; Nocturia[ ]   Vascular: Pain in legs with walking [ ] ; Pain in feet with lying flat [ ] ;  Non-healing sores [ ] ; Stroke [ ] ; TIA [ ] ; Slurred speech [ ] ;  Neuro: Headaches[ ] ; Vertigo[ ] ; Seizures[ ] ; Paresthesias[ ] ;Blurred vision [ ] ; Diplopia [ ] ; Vision changes [ ]   Ortho/Skin: Arthritis [ ] ; Joint pain [ ] ; Muscle pain [ ] ; Joint swelling [ ] ; Back Pain [ ] ; Rash [ ]   Psych: Depression[ ] ; Anxiety[ ]   Heme: Bleeding problems [ ] ; Clotting disorders [ ] ; Anemia [ ]   Endocrine: Diabetes [ ] ; Thyroid dysfunction[ ]    Past Medical History:  Diagnosis Date  . Alcohol abuse   . GERD (gastroesophageal reflux disease)   . Tobacco abuse     Current Outpatient Medications  Medication Sig  Dispense Refill  . amiodarone (PACERONE) 200 MG tablet Take 1 tablet (200 mg total) by mouth 2 (two) times daily. 60 tablet 1  . atorvastatin (LIPITOR) 40 MG tablet Take 1 tablet (40 mg total) by mouth daily. 30 tablet 1  . cetirizine (ZYRTEC) 10 MG tablet Take 10 mg by mouth daily.    . clopidogrel (PLAVIX) 75 MG tablet Take 1 tablet (75 mg total) by mouth daily. 90 tablet 2  . feeding supplement, ENSURE ENLIVE, (ENSURE ENLIVE) LIQD Take 237 mLs by mouth 3 (three) times daily between meals. 237 mL 12  . HYDROmorphone (DILAUDID) 2 MG tablet Take 1 tablet 3 times a day for 1 week then 1 tablet twice a day for 2 weeks 30 tablet 0  . ketotifen (ZADITOR) 0.025 % ophthalmic solution Place 1 drop into both eyes 2 (two) times daily. 5 mL 0  . OLANZapine (ZYPREXA) 2.5 MG tablet Take 1 tablet (2.5 mg total) by mouth at bedtime. 30 tablet 1  . sacubitril-valsartan (ENTRESTO) 97-103 MG Take 1 tablet by mouth 2 (two) times daily. 60 tablet 1  . spironolactone (ALDACTONE) 25 MG tablet Take 1 tablet (25 mg total) by mouth daily. 30 tablet 1  . thiamine 100 MG tablet Take 1 tablet (100 mg total) by mouth daily. 30 tablet 1  . torsemide (DEMADEX) 20 MG tablet Take 20 mg by mouth daily.    . traMADol (ULTRAM) 50 MG tablet Take 1 tablet (50 mg total) by mouth every 6 (six) hours as needed. 40 tablet 0  . warfarin (COUMADIN) 5 MG tablet Take 1 tablet (5 mg total) by mouth one time only at 4 PM. (Patient taking differently: Take 2.5 mg by mouth one time only at 4 PM. ) 30 tablet 1   No current facility-administered medications for this encounter.    No Known Allergies    Social History   Socioeconomic History  . Marital status: Single    Spouse name: Not on file  . Number of children: Not on file  . Years of education: Not on file  . Highest education level: Not on file  Occupational History  . Not on file  Tobacco Use  . Smoking status: Former Smoker    Quit date: 10/23/2019    Years since quitting:  0.0  . Smokeless tobacco: Never Used  Substance and Sexual Activity  . Alcohol use: Yes  . Drug use: No  . Sexual activity: Not on file  Other Topics Concern  . Not on file  Social History Narrative  . Not on file   Social Determinants of Health   Financial Resource Strain:   . Difficulty of Paying Living Expenses:   Food Insecurity:   . Worried About Charity fundraiser in the Last Year:   .  Ran Out of Food in the Last Year:   Transportation Needs:   . Film/video editor (Medical):   Marland Kitchen Lack of Transportation (Non-Medical):   Physical Activity:   . Days of Exercise per Week:   . Minutes of Exercise per Session:   Stress:   . Feeling of Stress :   Social Connections:   . Frequency of Communication with Friends and Family:   . Frequency of Social Gatherings with Friends and Family:   . Attends Religious Services:   . Active Member of Clubs or Organizations:   . Attends Archivist Meetings:   Marland Kitchen Marital Status:   Intimate Partner Violence:   . Fear of Current or Ex-Partner:   . Emotionally Abused:   Marland Kitchen Physically Abused:   . Sexually Abused:       Family History  Problem Relation Age of Onset  . Heart attack Father     Vitals:   11/24/19 1133  BP: 104/78  Pulse: 92  SpO2: 99%  Weight: 78.9 kg (174 lb)     PHYSICAL EXAM: General:  Well appearing. No respiratory difficulty HEENT: normal Neck: supple. no JVD. Carotids 2+ bilat; no bruits. No lymphadenopathy or thyromegaly appreciated. Cor: PMI nondisplaced. Regular rate & rhythm. No rubs, gallops or murmurs. Sternotomy site well healed  Lungs: clear Abdomen: soft, nontender, nondistended. No hepatosplenomegaly. No bruits or masses. Good bowel sounds. Extremities: no cyanosis, clubbing, rash, edema Neuro: alert & oriented x 3, cranial nerves grossly intact. moves all 4 extremities w/o difficulty. Affect pleasant.  ECG: NSR 85 bpm, bifascicular block    ASSESSMENT & PLAN:  1. Chronic Systolic  Heart Failure: Recent admit 6/21 for cardiogenic shock in the setting of LAD perforation during PCI. Pre-OR echo with EF 45%, apical hypokinesis. TEE in OR after VF arrest showed severe biventricular dysfunction=> developed cardiogenic shock. Placed on ECMO. Repeat echo 6/6 with EF 20-25%, RV function improved/near normal. Decannulated to Impella 5.5 on 6/7. Impella 5.5 removed in OR 6/11. Weaned off inotrope's and pressors. Repeat echo 6/10 showed EF improved up to 40%. RV normal. Started on GDMT  - doing well post hospital, NYHA Class II - Volume status stable. Continue torsemide 40 po daily - Continue Entresto 97-103 bid  - Continue spiro 25 mg daily  - no b-blocker yet w/ recent shock - Check BMP today  - Continue daily wts. Encouraged low sodium diet  - recommend outpatient cardiac rehab once cleared by CT surgery   2. CAD: S/p DES to distal RCA on 5/25. Has known CTO of OM. Had attempted intervention on CTO LAD 06/29/07 that was complicated by LAD perforation, now s/p LAD repair. The LAD was not graftable, LAD territory therefore is not perfused except by pre-existing collaterals.  - stable w/o anginal symptoms  - Continue Plavix. No ASA w/ coumadin  - Continue statin, atorvastatin 40 daily.    3. Pericardial tamponade: Due to LAD perforation. Treated in OR 6/2 w/ emergent drainage of pericardial tamponade and repair of LAD.  - f/u echo 6/10 showed no evidence of pericardial effusion   4. VF arrest: occurrence in the setting of perforated LAD w/ pericardial tamponade  - last echo w/ EF up to 40% - remains on PO amiodarone - continue 200 mg bid for now, until his return f/u w/ Dr. Prescott Gum 7/21 - recommend avoidance of long-term amiodarone use given relatively young age - will check TSH and HFTs   5. Tobacco Abuse - has quit smoking -  congratulated on his efforts  F/u again w/ Dr. Aundra Dubin in 4 weeks     Lyda Jester, PA-C 11/24/19

## 2019-11-24 NOTE — Telephone Encounter (Signed)
Pt calls to request a refill of Tramadol d/t persistent sternal pain after impella removal on 11/03/19 by Dr. Prescott Gum. Pt denies acute chest pain, radiating pain, and shortness of breath. He was given 40 tabs of Tramadol on 11/17/19 with instructions to take 1 tab q 6 hrs prn pain. Pt states he has 4 tabs left, although 12 should remain if he took maximum allowed. Dr. Prescott Gum notified of request; states he will call in a refill on Monday. Recommended taking Tylenol prn as directed on bottle (unless MD has told him he shouldn't take) if/when he runs out of Tramadol. To call [facility] prn.

## 2019-11-26 ENCOUNTER — Other Ambulatory Visit: Payer: Self-pay | Admitting: Cardiothoracic Surgery

## 2019-11-26 MED ORDER — TRAMADOL HCL 50 MG PO TABS: 50.0000 mg | ORAL_TABLET | Freq: Four times a day (QID) | ORAL | 0 refills | Status: DC | PRN

## 2019-11-28 ENCOUNTER — Ambulatory Visit (INDEPENDENT_AMBULATORY_CARE_PROVIDER_SITE_OTHER): Payer: BLUE CROSS/BLUE SHIELD

## 2019-11-28 ENCOUNTER — Other Ambulatory Visit: Payer: Self-pay

## 2019-11-28 DIAGNOSIS — I513 Intracardiac thrombosis, not elsewhere classified: Secondary | ICD-10-CM | POA: Diagnosis not present

## 2019-11-28 DIAGNOSIS — Z5181 Encounter for therapeutic drug level monitoring: Secondary | ICD-10-CM

## 2019-11-28 LAB — POCT INR: INR: 2.4 (ref 2.0–3.0)

## 2019-11-28 NOTE — Patient Instructions (Signed)
Continue taking 1/2 tablet of warfarin daily. Recheck INR in 1 week. [phone].

## 2019-11-29 ENCOUNTER — Telehealth: Payer: Self-pay | Admitting: Cardiovascular Disease

## 2019-11-29 ENCOUNTER — Telehealth: Payer: Self-pay

## 2019-11-29 ENCOUNTER — Other Ambulatory Visit: Payer: Self-pay | Admitting: Physician Assistant

## 2019-11-29 NOTE — Telephone Encounter (Signed)
Pt c/o BP issue: STAT if pt c/o blurred vision, one-sided weakness or slurred speech  1. What are your last 5 BP readings?  11/29/19 140/99 (10:00 AM)  2. Are you having any other symptoms (ex. Dizziness, headache, blurred vision, passed out)? Dizziness   3. What is your BP issue? Hypertension with Dizziness

## 2019-11-29 NOTE — Telephone Encounter (Signed)
[facility] had pt contact Dr.Cooper's office (see note below). Dr.Cooper's office routed message to our office. Dr.McLean please advise.   Marcia Brash, RN      11/29/19 10:40 AM Note Pt calls office to report problems with acid reflux and to notify that his BP was 140/99 this morning. Pt denies acute chest pain, radiating pain, headache, and other acute symptoms. He reports occasional epigastric pain and "burning discomfort" in his throat since yesterday. He states he has been diagnosed with acid reflux in the past and used to take Zantac for this, but he has not been taking this recently. Advised to report symptoms to his PCP, and advised to call his cardiologist (Dr. Burt Knack) regarding elevated BP. Instructed to seek urgent medical attention for acute chest pain, shortness of breath, and/or other acute symptoms and to call [facility] prn.

## 2019-11-29 NOTE — Telephone Encounter (Signed)
Pt calls office to report problems with acid reflux and to notify that his BP was 140/99 this morning. Pt denies acute chest pain, radiating pain, headache, and other acute symptoms. He reports occasional epigastric pain and "burning discomfort" in his throat since yesterday. He states he has been diagnosed with acid reflux in the past and used to take Zantac for this, but he has not been taking this recently. Advised to report symptoms to his PCP, and advised to call his cardiologist (Dr. Burt Knack) regarding elevated BP. Instructed to seek urgent medical attention for acute chest pain, shortness of breath, and/or other acute symptoms and to call [facility] prn.

## 2019-11-30 NOTE — Telephone Encounter (Signed)
   I personally called. He has been taking all medications.   Says he gets dizzy when he is standing.    120/84. Weight at home has been 174 which is stable for him.   I have instructed him to cut back torsemide to 20 mg every other day.   He verbalized understanding and appreciated the call.   Ondria Oswald NP-C  11:52 AM

## 2019-11-30 NOTE — Telephone Encounter (Signed)
I called see note. Thanks  Hanna Aultman NP-C  11:54 AM

## 2019-12-01 DIAGNOSIS — J9601 Acute respiratory failure with hypoxia: Secondary | ICD-10-CM | POA: Diagnosis not present

## 2019-12-01 DIAGNOSIS — G9340 Encephalopathy, unspecified: Secondary | ICD-10-CM | POA: Diagnosis not present

## 2019-12-01 DIAGNOSIS — Z7901 Long term (current) use of anticoagulants: Secondary | ICD-10-CM | POA: Diagnosis not present

## 2019-12-01 DIAGNOSIS — F1010 Alcohol abuse, uncomplicated: Secondary | ICD-10-CM | POA: Diagnosis not present

## 2019-12-01 DIAGNOSIS — Z48812 Encounter for surgical aftercare following surgery on the circulatory system: Secondary | ICD-10-CM | POA: Diagnosis not present

## 2019-12-01 DIAGNOSIS — Z955 Presence of coronary angioplasty implant and graft: Secondary | ICD-10-CM | POA: Diagnosis not present

## 2019-12-01 DIAGNOSIS — E43 Unspecified severe protein-calorie malnutrition: Secondary | ICD-10-CM | POA: Diagnosis not present

## 2019-12-01 DIAGNOSIS — I314 Cardiac tamponade: Secondary | ICD-10-CM | POA: Diagnosis not present

## 2019-12-01 DIAGNOSIS — I2510 Atherosclerotic heart disease of native coronary artery without angina pectoris: Secondary | ICD-10-CM | POA: Diagnosis not present

## 2019-12-01 DIAGNOSIS — F17210 Nicotine dependence, cigarettes, uncomplicated: Secondary | ICD-10-CM | POA: Diagnosis not present

## 2019-12-05 ENCOUNTER — Other Ambulatory Visit: Payer: Self-pay

## 2019-12-05 ENCOUNTER — Ambulatory Visit (INDEPENDENT_AMBULATORY_CARE_PROVIDER_SITE_OTHER): Payer: BLUE CROSS/BLUE SHIELD

## 2019-12-05 DIAGNOSIS — I513 Intracardiac thrombosis, not elsewhere classified: Secondary | ICD-10-CM

## 2019-12-05 DIAGNOSIS — J9601 Acute respiratory failure with hypoxia: Secondary | ICD-10-CM | POA: Diagnosis not present

## 2019-12-05 DIAGNOSIS — F17210 Nicotine dependence, cigarettes, uncomplicated: Secondary | ICD-10-CM | POA: Diagnosis not present

## 2019-12-05 DIAGNOSIS — I2510 Atherosclerotic heart disease of native coronary artery without angina pectoris: Secondary | ICD-10-CM | POA: Diagnosis not present

## 2019-12-05 DIAGNOSIS — I314 Cardiac tamponade: Secondary | ICD-10-CM | POA: Diagnosis not present

## 2019-12-05 DIAGNOSIS — F1010 Alcohol abuse, uncomplicated: Secondary | ICD-10-CM | POA: Diagnosis not present

## 2019-12-05 DIAGNOSIS — Z955 Presence of coronary angioplasty implant and graft: Secondary | ICD-10-CM | POA: Diagnosis not present

## 2019-12-05 DIAGNOSIS — Z5181 Encounter for therapeutic drug level monitoring: Secondary | ICD-10-CM | POA: Diagnosis not present

## 2019-12-05 DIAGNOSIS — G9340 Encephalopathy, unspecified: Secondary | ICD-10-CM | POA: Diagnosis not present

## 2019-12-05 DIAGNOSIS — Z7901 Long term (current) use of anticoagulants: Secondary | ICD-10-CM | POA: Diagnosis not present

## 2019-12-05 DIAGNOSIS — Z48812 Encounter for surgical aftercare following surgery on the circulatory system: Secondary | ICD-10-CM | POA: Diagnosis not present

## 2019-12-05 DIAGNOSIS — E43 Unspecified severe protein-calorie malnutrition: Secondary | ICD-10-CM | POA: Diagnosis not present

## 2019-12-05 LAB — POCT INR: INR: 1.5 — AB (ref 2.0–3.0)

## 2019-12-05 NOTE — Patient Instructions (Signed)
Description   Take 1 tablet today and tomorrow, then start taking 1/2 tablet of warfarin daily except 1 tablet on Mondays. Recheck INR in 1 week. [phone].

## 2019-12-08 ENCOUNTER — Telehealth: Payer: Self-pay

## 2019-12-08 NOTE — Telephone Encounter (Signed)
Patient contacted the office multiple times for pain medication refill for Tramadol.  Patient is s/p Impella removal 11/03/19 with Dr. Prescott Gum. Refill of Tramadol called in Monday 11/27/19 by Dr. Prescott Gum. Dr. Prescott Gum made aware and stated patient has max out on 7 day post-op pain medication.  Patient made aware.  No refill given at this time.  Patient does have a f/u appointment with Dr. Prescott Gum next Wednesday, 12/13/19 for further evaluation.

## 2019-12-12 ENCOUNTER — Ambulatory Visit (INDEPENDENT_AMBULATORY_CARE_PROVIDER_SITE_OTHER): Payer: BLUE CROSS/BLUE SHIELD

## 2019-12-12 ENCOUNTER — Other Ambulatory Visit (HOSPITAL_COMMUNITY): Payer: Self-pay

## 2019-12-12 ENCOUNTER — Other Ambulatory Visit: Payer: Self-pay | Admitting: Cardiothoracic Surgery

## 2019-12-12 ENCOUNTER — Encounter: Payer: Self-pay | Admitting: Physician Assistant

## 2019-12-12 ENCOUNTER — Other Ambulatory Visit: Payer: Self-pay

## 2019-12-12 ENCOUNTER — Ambulatory Visit (INDEPENDENT_AMBULATORY_CARE_PROVIDER_SITE_OTHER): Payer: BLUE CROSS/BLUE SHIELD | Admitting: Physician Assistant

## 2019-12-12 VITALS — BP 122/90 | HR 92 | Ht 68.5 in | Wt 176.0 lb

## 2019-12-12 DIAGNOSIS — I2510 Atherosclerotic heart disease of native coronary artery without angina pectoris: Secondary | ICD-10-CM | POA: Diagnosis not present

## 2019-12-12 DIAGNOSIS — Z9281 Personal history of extracorporeal membrane oxygenation (ECMO): Secondary | ICD-10-CM

## 2019-12-12 DIAGNOSIS — Z5181 Encounter for therapeutic drug level monitoring: Secondary | ICD-10-CM | POA: Diagnosis not present

## 2019-12-12 DIAGNOSIS — I513 Intracardiac thrombosis, not elsewhere classified: Secondary | ICD-10-CM | POA: Diagnosis not present

## 2019-12-12 DIAGNOSIS — I10 Essential (primary) hypertension: Secondary | ICD-10-CM | POA: Diagnosis not present

## 2019-12-12 DIAGNOSIS — I5022 Chronic systolic (congestive) heart failure: Secondary | ICD-10-CM | POA: Diagnosis not present

## 2019-12-12 DIAGNOSIS — Z720 Tobacco use: Secondary | ICD-10-CM

## 2019-12-12 DIAGNOSIS — E782 Mixed hyperlipidemia: Secondary | ICD-10-CM

## 2019-12-12 LAB — POCT INR: INR: 4.8 — AB (ref 2.0–3.0)

## 2019-12-12 MED ORDER — SPIRONOLACTONE 25 MG PO TABS: 25.0000 mg | ORAL_TABLET | Freq: Every day | ORAL | 1 refills | Status: DC

## 2019-12-12 MED ORDER — ATORVASTATIN CALCIUM 40 MG PO TABS: 40.0000 mg | ORAL_TABLET | Freq: Every day | ORAL | 1 refills | Status: DC

## 2019-12-12 MED ORDER — CARVEDILOL 3.125 MG PO TABS
3.1250 mg | ORAL_TABLET | Freq: Two times a day (BID) | ORAL | 1 refills | Status: DC
Start: 2019-12-12 — End: 2020-01-05

## 2019-12-12 MED ORDER — AMIODARONE HCL 200 MG PO TABS: 200.0000 mg | ORAL_TABLET | Freq: Two times a day (BID) | ORAL | 1 refills | Status: DC

## 2019-12-12 MED ORDER — SACUBITRIL-VALSARTAN 97-103 MG PO TABS: 1.0000 | ORAL_TABLET | Freq: Two times a day (BID) | ORAL | 1 refills | Status: DC

## 2019-12-12 NOTE — Patient Instructions (Addendum)
Medication Instructions:  Your physician has recommended you make the following change in your medication:   1) Stop Zyprexa 2) Start Carvedilol 3.125 mg, 1 tablet by mouth twice a day  *If you need a refill on your cardiac medications before your next appointment, please call your pharmacy*  Lab Work: None ordered today  If you have labs (blood work) drawn today and your tests are completely normal, you will receive your results only by: Marland Kitchen MyChart Message (if you have MyChart) OR . A paper copy in the mail If you have any lab test that is abnormal or we need to change your treatment, we will call you to review the results.  Testing/Procedures: None ordered today  Follow-Up: On 01/05/20 with Dr. Aundra Dubin  Other Instructions Contact Dr. Prescott Gum about pain medication

## 2019-12-12 NOTE — Progress Notes (Signed)
Cardiology Office Note:    Date:  12/12/2019   ID:  William Crawford, DOB [birth year], MRN [id number]  PCP:  Patient, No Pcp Per  Cardiologist:  Sherren Mocha, MD   Electrophysiologist:  None  Advanced Heart Failure Clinic:  Loralie Champagne, MD    Referring MD: Sherren Mocha, MD   Chief Complaint:  Follow-up (CAD, CHF)    Patient Profile:    William Crawford is a 49 y.o. male with:   Coronary artery disease  S/p NSTEMI 5/21 >> PCI:  DES to mRCA  Intol of Ticagrelor (dyspnea) >> Clopidogrel   Bradycardia >> no beta-blocker   CTO of LAD and OM1  Attempted CTO PCI of LAD unsuccessful; c/b large perf and tamponade despite prolonged balloon inflations and placement of 2 covered stents >> emergent OHS (repair of LAD perf, cannulation for VA ECMO, drainage of pericardial fluid >> transitioned to Impella)  VF arrest in OR >> maintained on Amiodarone  RA thrombus (noted during Impella removal) >> Warfarin  Systolic CHF  Ischemic CM  EF 10/26/19 < 20   EF 11/02/19: 40-45  Hypertension   Hyperlipidemia   Tobacco use  Alcohol abuse  Prior CV studies: Limited Echo 11/02/19 EF 40-45, normal RVSF  Limited Echo 11/01/19 EF 30-35  Limited Echo 10/31/19 EF 25-30  Limited Echo 10/29/19 EF 20-25  Echo 10/26/19 EF < 20, Gr 2 DD, AS, lat, ant, inf, apical severe AK, severely reduced RVSF, mild RAE, mild MR  Limited Echo 10/25/19 EF 45, mild LVH, normal RVSF, mod pericardial effusion w/ tamponade physio  CTO PCI 10/25/19  Mid LAD lesion is 100% stenosed.  1st Mrg lesion is 100% stenosed.  Mid Cx to Dist Cx lesion is 50% stenosed.  Prox RCA to Mid RCA lesion is 35% stenosed.  A covered stent placed using a STENT PK PAPYRUS 2.5X20. and a 2.5 x 15 mm PK PAPYRUS with unsuccessful sealing of the perforation. 1. Unsuccessful CTO PCI of the LAD. Able to cross the lesion with a wire. With initial balloon inflation with a 2.0 mm balloon the patient developed a large perforation. We  were unable to seal the perforation with prolonged balloon inflations and placement of Covered stents x 2. Balloon left inflated in the vessel to tamponade the perforation until definitive surgery done. Plan emergent surgery for oversewing of the perforation.  Cardiac catheterization 10/17/19 LAD mid 100 (CTO) L-L collats; D1 mild dz LCx mid 50; OM1 100  RCA prox 35, mid 95 PCI: 4 x 18 mm Resolute Onyx DES to mRCA  Echocardiogram 10/17/19 EF 35-40, inf-lat + inf HK, Gr 2 DD, normal RVSF  History of Present Illness:    William Crawford was admitted in 09/2019 with a NSTEMI tx with a DES to the [facility].  The patient had an EF of 35-40 as well as CTO of the LAD and OM1.  He was brought back in early June for attempted CTO PCI of the LAD.  This was unsuccessful and resulted in a large perforation and tamponade despite several balloon inflations and 2 covered stents. He was taken emergently to the OR and developed VF arrest requiring CPR for 15 minutes.  He underwent repair of the LAD perforation and was placed on VA ECMO.  He was ultimately transitioned to Impella.  He was noted to have a RA thrombus by TEE during removal of the Impella device and he was placed on Warfarin.  LV function was severely depressed with EF < 20.  He  was followed by the Advanced HF Team and required inotropic support with Milrinone due to cardiogenic shock.  His EF improved prior to DC to 40-45%.  He was seen in follow up by Lyda Jester, PA-C in the CHF clinic 11/24/19 and has follow up there again in Aug 2021.   He returns for follow up.  He is here with his mother.  He is still having a lot of post surgical pain.  Tramadol has not provided much relief.  He has follow up with Dr. Prescott Gum tomorrow.  He has not had significant shortness of breath.  He has not had orthopnea, leg swelling or syncope.  Blood pressures at home have been creeping up.  Past Medical History:  Diagnosis Date  . Alcohol abuse   . GERD (gastroesophageal  reflux disease)   . Tobacco abuse     Current Medications: Current Meds  Medication Sig  . amiodarone (PACERONE) 200 MG tablet Take 1 tablet (200 mg total) by mouth 2 (two) times daily.  Marland Kitchen atorvastatin (LIPITOR) 40 MG tablet Take 1 tablet (40 mg total) by mouth daily.  . cetirizine (ZYRTEC) 10 MG tablet Take 10 mg by mouth daily.  . clopidogrel (PLAVIX) 75 MG tablet Take 1 tablet (75 mg total) by mouth daily.  . feeding supplement, ENSURE ENLIVE, (ENSURE ENLIVE) LIQD Take 237 mLs by mouth 3 (three) times daily between meals.  Marland Kitchen HYDROmorphone (DILAUDID) 2 MG tablet Take 1 tablet 3 times a day for 1 week then 1 tablet twice a day for 2 weeks  . ketotifen (ZADITOR) 0.025 % ophthalmic solution Place 1 drop into both eyes 2 (two) times daily.  . sacubitril-valsartan (ENTRESTO) 97-103 MG Take 1 tablet by mouth 2 (two) times daily.  Marland Kitchen spironolactone (ALDACTONE) 25 MG tablet Take 1 tablet (25 mg total) by mouth daily.  Marland Kitchen thiamine 100 MG tablet Take 1 tablet (100 mg total) by mouth daily.  Marland Kitchen torsemide (DEMADEX) 20 MG tablet Take 20 mg by mouth every other day.  . traMADol (ULTRAM) 50 MG tablet Take 1 tablet (50 mg total) by mouth every 6 (six) hours as needed.  . warfarin (COUMADIN) 5 MG tablet Take 2.5 mg by mouth daily.  . [DISCONTINUED] OLANZapine (ZYPREXA) 2.5 MG tablet Take 1 tablet (2.5 mg total) by mouth at bedtime.  . [DISCONTINUED] warfarin (COUMADIN) 5 MG tablet Take 1 tablet (5 mg total) by mouth one time only at 4 PM. (Patient taking differently: Take 2.5 mg by mouth one time only at 4 PM. )  . [DISCONTINUED] warfarin (COUMADIN) 5 MG tablet Take 2.5 mg by mouth daily.     Allergies:   Patient has no known allergies.   Social History   Tobacco Use  . Smoking status: Former Smoker    Quit date: 10/23/2019    Years since quitting: 0.1  . Smokeless tobacco: Never Used  Substance Use Topics  . Alcohol use: Yes  . Drug use: No     Family Hx: The patient's family history includes  Heart attack in his father.  ROS   EKGs/Labs/Other Test Reviewed:    EKG:  EKG is  not ordered today.  The ekg ordered today demonstrates n/a  Recent Labs: 10/17/2019: B Natriuretic Peptide 608.8 11/07/2019: Magnesium 2.2 11/24/2019: ALT 80; BUN 13; Creatinine, Ser 1.08; Hemoglobin 13.1; Platelets 323; Potassium 4.1; Sodium 137; TSH 3.899   Recent Lipid Panel Lab Results  Component Value Date/Time   CHOL 193 10/18/2019 05:32 AM   TRIG 123  11/06/2019 04:23 AM   HDL 37 (L) 10/18/2019 05:32 AM   CHOLHDL 5.2 10/18/2019 05:32 AM   LDLCALC 122 (H) 10/18/2019 05:32 AM    Physical Exam:    VS:  BP 122/90   Pulse 92   Ht 5' 8.5" (1.74 m)   Wt 176 lb (79.8 kg)   SpO2 98%   BMI 26.37 kg/m     Wt Readings from Last 3 Encounters:  12/12/19 176 lb (79.8 kg)  11/24/19 174 lb (78.9 kg)  11/22/19 176 lb (79.8 kg)     Constitutional:      Appearance: Healthy appearance. Not in distress.  Neck:     Vascular: JVD normal.  Pulmonary:     Effort: Pulmonary effort is normal.     Breath sounds: No wheezing. No rales.  Chest:     Comments: Median sternotomy well-healed without erythema or discharge Cardiovascular:     Normal rate. Regular rhythm. Normal S1. Normal S2.     Murmurs: There is no murmur.     No rub.  Edema:    Peripheral edema absent.  Abdominal:     Palpations: Abdomen is soft.  Skin:    General: Skin is warm and dry.  Neurological:     General: No focal deficit present.     Mental Status: Alert and oriented to person, place and time.     Cranial Nerves: Cranial nerves are intact.       ASSESSMENT & PLAN:    1. Chronic systolic heart failure (HCC) EF was <20 percent and has improved to 40-45 percent.  He is managed closely with the congestive heart failure clinic.  He has a follow-up August 13 with Dr. Aundra Dubin.  Continue Entresto, spironolactone.  I did discuss his case with Dr. Aundra Dubin by phone today.  We will start him on carvedilol 3.125 mg twice daily.  2.  Coronary artery disease involving native coronary artery of native heart without angina pectoris Status post non-STEMI in May 2021 treated with a drug-eluting stent to the RCA.  CTO PCI of the LAD in June was complicated by LAD perforation requiring pericardial effusion drainage, LAD repair and placement of VA ECMO.  He had a prolonged hospitalization.  He still has a lot of significant postsurgical pain.  He sees Dr. Prescott Gum tomorrow.  I have asked him to discuss this with him further.  He notes the tramadol has not helped.  He also notes that acetaminophen is not helpful.  Question if he would benefit from gabapentin.  I will leave this up to Dr. Prescott Gum.  Continue clopidogrel, atorvastatin.  Start carvedilol as noted above.  He was started on olanzapine in the hospital due to agitation in the ICU.  I discussed this with Dr. Aundra Dubin as well.  We do not think he needs to remain on this medication and he can stop.  He will follow up with Dr. Burt Knack once he is released from the heart failure clinic.  3. Atrial thrombus He remains on Coumadin.  4. Essential hypertension Blood pressure is increasing.  Start carvedilol as noted above.  5. Mixed hyperlipidemia Continue high intensity statin therapy.  6. Tobacco use He has quit smoking.    Dispo:  Return in 24 days (on 01/05/2020) for Scheduled Follow Up, w/ Dr. Aundra Dubin in [facility].   Medication Adjustments/Labs and Tests Ordered: Current medicines are reviewed at length with the patient today.  Concerns regarding medicines are outlined above.  Tests Ordered: No orders  of the defined types were placed in this encounter.  Medication Changes: Meds ordered this encounter  Medications  . carvedilol (COREG) 3.125 MG tablet    Sig: Take 1 tablet (3.125 mg total) by mouth 2 (two) times daily with a meal.    Dispense:  180 tablet    Refill:  1    Signed, Richardson Dopp, PA-C  12/12/2019 2:38 PM    [facility] [address], [place], [place]  [address] Phone: [phone]; Fax: [phone]

## 2019-12-12 NOTE — Patient Instructions (Signed)
Description   Skip today and tomorrow's dosage of Warfarin, then start taking 1/2 tablet of warfarin daily. Recheck INR in 1 week. [phone].

## 2019-12-13 ENCOUNTER — Ambulatory Visit (INDEPENDENT_AMBULATORY_CARE_PROVIDER_SITE_OTHER): Payer: Self-pay | Admitting: Cardiothoracic Surgery

## 2019-12-13 ENCOUNTER — Encounter: Payer: Self-pay | Admitting: Cardiothoracic Surgery

## 2019-12-13 ENCOUNTER — Ambulatory Visit
Admission: RE | Admit: 2019-12-13 | Discharge: 2019-12-13 | Disposition: A | Discharge status: Home / Self Care | Payer: BLUE CROSS/BLUE SHIELD | Source: Ambulatory Visit | Attending: Cardiothoracic Surgery | Admitting: Cardiothoracic Surgery

## 2019-12-13 VITALS — BP 136/90 | HR 80 | Temp 97.9°F | Resp 20 | Ht 68.5 in | Wt 176.0 lb

## 2019-12-13 DIAGNOSIS — Z9281 Personal history of extracorporeal membrane oxygenation (ECMO): Secondary | ICD-10-CM

## 2019-12-13 DIAGNOSIS — J9811 Atelectasis: Secondary | ICD-10-CM | POA: Diagnosis not present

## 2019-12-13 DIAGNOSIS — Z4889 Encounter for other specified surgical aftercare: Secondary | ICD-10-CM | POA: Insufficient documentation

## 2019-12-13 DIAGNOSIS — Z09 Encounter for follow-up examination after completed treatment for conditions other than malignant neoplasm: Secondary | ICD-10-CM

## 2019-12-13 DIAGNOSIS — Z95811 Presence of heart assist device: Secondary | ICD-10-CM

## 2019-12-13 MED ORDER — OXYCODONE HCL 5 MG PO TABS: 5.0000 mg | ORAL_TABLET | ORAL | 0 refills | Status: DC | PRN

## 2019-12-13 MED ORDER — GABAPENTIN 300 MG PO CAPS: 300.0000 mg | ORAL_CAPSULE | Freq: Three times a day (TID) | ORAL | 1 refills | Status: DC

## 2019-12-13 NOTE — Progress Notes (Signed)
PCP is Patient, No Pcp Per Referring Provider is Sherren Mocha, MD  Chief Complaint  Patient presents with  . Routine Post Op    f/u from surgery with CXR s/p Impella removal 11/03/19    HPI: Surgical follow-up, wound check, discussion of pain control. Patient's chest x-ray today is clear with sternal wires intact. Patient describing symptoms of neuropathic pain with exquisite hypersensitivity over the sternal incision and intolerance to touch or pressure.  Shooting pain from top to bottom.  Tramadol is not effective.  Tylenol is not effective. Patient is only moderately comfortable if he remains still with his back straight.    Past Medical History:  Diagnosis Date  . Alcohol abuse   . GERD (gastroesophageal reflux disease)   . Tobacco abuse     Past Surgical History:  Procedure Laterality Date  . APPLICATION OF WOUND VAC  10/25/2019   Procedure: Application Of Wound Vac;  Surgeon: [name], MD;  Location: [place];  Service: Open Heart Surgery;;  . CANNULATION FOR ECMO (EXTRACORPOREAL MEMBRANE OXYGENATION)  10/25/2019   Procedure: Cannulation For Ecmo (Extracorporeal Membrane Oxygenation) @ 0737;  Surgeon: [name], MD;  Location: [place];  Service: Open Heart Surgery;;  . CANNULATION FOR ECMO (EXTRACORPOREAL MEMBRANE OXYGENATION) N/A 10/30/2019   Procedure: DECANNULATION OF ECMO (EXTRACORPOREAL MEMBRANE OXYGENATION);  Surgeon: Prescott Gum, Collier Salina, MD;  Location: [place];  Service: Open Heart Surgery;  Laterality: N/A;  Pump standby  . CHEST EXPLORATION  10/25/2019   Procedure: Chest Exploration for repair of coronary perforation (repair of LAD);  Surgeon: Prescott Gum, Collier Salina, MD;  Location: [place];  Service: Open Heart Surgery;;  . CORONARY CTO INTERVENTION N/A 10/25/2019   Procedure: CORONARY CTO INTERVENTION;  Surgeon: Martinique, Peter M, MD;  Location: [place] [facility];  Service: Cardiovascular;  Laterality: N/A;  . CORONARY STENT INTERVENTION N/A 10/17/2019   Procedure: CORONARY  STENT INTERVENTION;  Surgeon: Sherren Mocha, MD;  Location: [place] [facility];  Service: Cardiovascular;  Laterality: N/A;  . LEFT HEART CATH AND CORONARY ANGIOGRAPHY N/A 10/17/2019   Procedure: LEFT HEART CATH AND CORONARY ANGIOGRAPHY;  Surgeon: Sherren Mocha, MD;  Location: [place] [facility];  Service: Cardiovascular;  Laterality: N/A;  . MEDIASTERNOTOMY  10/25/2019   Procedure: Median Sternotomy.;  Surgeon: [name], MD;  Location: [place];  Service: Open Heart Surgery;;  . PLACEMENT OF IMPELLA LEFT VENTRICULAR ASSIST DEVICE  10/30/2019   Procedure: Placement Of Impella Left Ventricular Assist Device 5.5 USING 10MM HEMASHIELD PLATINUM GRAFT;  Surgeon: [name], MD;  Location: [place];  Service: Open Heart Surgery;;  graft placed into aorta  . REMOVAL OF IMPELLA LEFT VENTRICULAR ASSIST DEVICE N/A 11/03/2019   Procedure: REMOVAL OF IMPELLA 5.5 LEFT VENTRICULAR ASSIST DEVICE;  Surgeon: [name], MD;  Location: [place];  Service: Open Heart Surgery;  Laterality: N/A;  . STERNAL CLOSURE N/A 10/30/2019   Procedure: Sternal Closure;  Surgeon: [name], MD;  Location: [place];  Service: Open Heart Surgery;  Laterality: N/A;  . TEE WITHOUT CARDIOVERSION N/A 10/25/2019   Procedure: TRANSESOPHAGEAL ECHOCARDIOGRAM (TEE);  Surgeon: Prescott Gum, Collier Salina, MD;  Location: [place];  Service: Open Heart Surgery;  Laterality: N/A;  . TEE WITHOUT CARDIOVERSION N/A 10/30/2019   Procedure: TRANSESOPHAGEAL ECHOCARDIOGRAM (TEE);  Surgeon: Prescott Gum, Collier Salina, MD;  Location: [place];  Service: Open Heart Surgery;  Laterality: N/A;  . TEE WITHOUT CARDIOVERSION N/A 11/03/2019   Procedure: TRANSESOPHAGEAL ECHOCARDIOGRAM (TEE);  Surgeon: Lucianne Lei  Donney Rankins, MD;  Location: [place];  Service: Open Heart Surgery;  Laterality: N/A;    Family History  Problem Relation Age of Onset  . Heart attack Father     Social History Social History   Tobacco Use  . Smoking status: Former Smoker    Quit date: 10/23/2019    Years  since quitting: 0.1  . Smokeless tobacco: Never Used  Substance Use Topics  . Alcohol use: Yes  . Drug use: No    Current Outpatient Medications  Medication Sig Dispense Refill  . amiodarone (PACERONE) 200 MG tablet Take 1 tablet (200 mg total) by mouth 2 (two) times daily. 60 tablet 1  . atorvastatin (LIPITOR) 40 MG tablet Take 1 tablet (40 mg total) by mouth daily. 30 tablet 1  . carvedilol (COREG) 3.125 MG tablet Take 1 tablet (3.125 mg total) by mouth 2 (two) times daily with a meal. 180 tablet 1  . cetirizine (ZYRTEC) 10 MG tablet Take 10 mg by mouth daily.    . clopidogrel (PLAVIX) 75 MG tablet Take 1 tablet (75 mg total) by mouth daily. 90 tablet 2  . feeding supplement, ENSURE ENLIVE, (ENSURE ENLIVE) LIQD Take 237 mLs by mouth 3 (three) times daily between meals. 237 mL 12  . HYDROmorphone (DILAUDID) 2 MG tablet Take 1 tablet 3 times a day for 1 week then 1 tablet twice a day for 2 weeks 30 tablet 0  . ketotifen (ZADITOR) 0.025 % ophthalmic solution Place 1 drop into both eyes 2 (two) times daily. 5 mL 0  . sacubitril-valsartan (ENTRESTO) 97-103 MG Take 1 tablet by mouth 2 (two) times daily. 60 tablet 1  . spironolactone (ALDACTONE) 25 MG tablet Take 1 tablet (25 mg total) by mouth daily. 30 tablet 1  . thiamine 100 MG tablet Take 1 tablet (100 mg total) by mouth daily. 30 tablet 1  . torsemide (DEMADEX) 20 MG tablet Take 20 mg by mouth every other day.    . traMADol (ULTRAM) 50 MG tablet Take 1 tablet (50 mg total) by mouth every 6 (six) hours as needed. 40 tablet 0  . warfarin (COUMADIN) 5 MG tablet Take 2.5 mg by mouth daily.     No current facility-administered medications for this visit.    No Known Allergies  Review of Systems  Losing some weight since surgery No fever No edema No drainage from  surgical incision No popping or clicking sensation of sternum.   Exam    General- alert and comfortable.  Surgical incision well-healed but extremely tender to any pressure.   No instability or click.    Neck- no JVD, no cervical adenopathy palpable, no carotid bruit   Lungs- clear without rales, wheezes   Cor- regular rate and rhythm, no murmur , gallop   Abdomen- soft, non-tender   Extremities - warm, non-tender, minimal edema   Neuro- oriented, appropriate, no focal weakness   BP 136/90   Pulse 80   Temp 97.9 F (36.6 C) (Skin)   Resp 20   Ht 5' 8.5" (1.74 m)   Wt 176 lb (79.8 kg)   SpO2 95%   BMI 26.37 kg/m  Physical Exam      Exam    General- alert and comfortable    Neck- no JVD, no cervical adenopathy palpable, no carotid bruit   Lungs- clear without rales, wheezes   Cor- regular rate and rhythm, no murmur , gallop   Abdomen- soft, non-tender   Extremities - warm, non-tender, minimal  edema   Neuro- oriented, appropriate, no focal weakness   Diagnostic Tests: Chest x-ray is clear personally reviewed  Impression: Neuropathic pain of sternal incision in 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  Plan: Add Neurontin 300 mg p.o. 3 times daily to oxycodone 5 mg p.o. as needed twice daily.  Return in 1 week to discuss pain control.   Len Childs, MD [facility] [phone]

## 2019-12-14 DIAGNOSIS — Z48812 Encounter for surgical aftercare following surgery on the circulatory system: Secondary | ICD-10-CM | POA: Diagnosis not present

## 2019-12-14 DIAGNOSIS — I2510 Atherosclerotic heart disease of native coronary artery without angina pectoris: Secondary | ICD-10-CM | POA: Diagnosis not present

## 2019-12-14 DIAGNOSIS — F17210 Nicotine dependence, cigarettes, uncomplicated: Secondary | ICD-10-CM | POA: Diagnosis not present

## 2019-12-14 DIAGNOSIS — G9340 Encephalopathy, unspecified: Secondary | ICD-10-CM | POA: Diagnosis not present

## 2019-12-14 DIAGNOSIS — J9601 Acute respiratory failure with hypoxia: Secondary | ICD-10-CM | POA: Diagnosis not present

## 2019-12-14 DIAGNOSIS — F1010 Alcohol abuse, uncomplicated: Secondary | ICD-10-CM | POA: Diagnosis not present

## 2019-12-14 DIAGNOSIS — E43 Unspecified severe protein-calorie malnutrition: Secondary | ICD-10-CM | POA: Diagnosis not present

## 2019-12-14 DIAGNOSIS — Z955 Presence of coronary angioplasty implant and graft: Secondary | ICD-10-CM | POA: Diagnosis not present

## 2019-12-14 DIAGNOSIS — I314 Cardiac tamponade: Secondary | ICD-10-CM | POA: Diagnosis not present

## 2019-12-14 DIAGNOSIS — Z7901 Long term (current) use of anticoagulants: Secondary | ICD-10-CM | POA: Diagnosis not present

## 2019-12-19 ENCOUNTER — Other Ambulatory Visit: Payer: Self-pay

## 2019-12-19 ENCOUNTER — Ambulatory Visit (INDEPENDENT_AMBULATORY_CARE_PROVIDER_SITE_OTHER): Payer: BLUE CROSS/BLUE SHIELD | Admitting: *Deleted

## 2019-12-19 DIAGNOSIS — Z5181 Encounter for therapeutic drug level monitoring: Secondary | ICD-10-CM

## 2019-12-19 DIAGNOSIS — I513 Intracardiac thrombosis, not elsewhere classified: Secondary | ICD-10-CM | POA: Diagnosis not present

## 2019-12-19 LAB — POCT INR: INR: 2.5 (ref 2.0–3.0)

## 2019-12-19 NOTE — Patient Instructions (Signed)
Description   Continue taking Warfarin 1/2 tablet daily. Recheck INR in 1 week. [phone].

## 2019-12-20 ENCOUNTER — Ambulatory Visit (INDEPENDENT_AMBULATORY_CARE_PROVIDER_SITE_OTHER): Payer: Self-pay | Admitting: Cardiothoracic Surgery

## 2019-12-20 ENCOUNTER — Encounter: Payer: Self-pay | Admitting: Cardiothoracic Surgery

## 2019-12-20 ENCOUNTER — Other Ambulatory Visit: Payer: Self-pay | Admitting: *Deleted

## 2019-12-20 VITALS — BP 113/73 | HR 70 | Resp 20 | Ht 68.5 in | Wt 178.0 lb

## 2019-12-20 DIAGNOSIS — G8918 Other acute postprocedural pain: Secondary | ICD-10-CM

## 2019-12-20 DIAGNOSIS — Z9889 Other specified postprocedural states: Secondary | ICD-10-CM

## 2019-12-20 MED ORDER — OXYCODONE HCL 5 MG PO TABS: 5.0000 mg | ORAL_TABLET | Freq: Three times a day (TID) | ORAL | 0 refills | Status: DC

## 2019-12-20 NOTE — Progress Notes (Signed)
PCP is Patient, No Pcp Per Referring Provider is Sherren Mocha, MD  Chief Complaint  Patient presents with  . Routine Post Op    1 week f/u, wound check, pain eval,s/p impella removal 11/03/19    HPI: The patient's pain is improved with the addition of gabapentin.  He is taking oxycodone 4 times a day.  He understands that we will need to wean him off the oxycodone while maintaining gabapentin for the neuropathic pain component.  The incision is healing well and there is no movement in the sternal incision.  His sleeping has improved and exercise tolerance has improved   Past Medical History:  Diagnosis Date  . Alcohol abuse   . GERD (gastroesophageal reflux disease)   . Tobacco abuse     Past Surgical History:  Procedure Laterality Date  . APPLICATION OF WOUND VAC  10/25/2019   Procedure: Application Of Wound Vac;  Surgeon: [name], MD;  Location: [place];  Service: Open Heart Surgery;;  . CANNULATION FOR ECMO (EXTRACORPOREAL MEMBRANE OXYGENATION)  10/25/2019   Procedure: Cannulation For Ecmo (Extracorporeal Membrane Oxygenation) @ 0737;  Surgeon: [name], MD;  Location: [place];  Service: Open Heart Surgery;;  . CANNULATION FOR ECMO (EXTRACORPOREAL MEMBRANE OXYGENATION) N/A 10/30/2019   Procedure: DECANNULATION OF ECMO (EXTRACORPOREAL MEMBRANE OXYGENATION);  Surgeon: Prescott Gum, Collier [place]ina, MD;  Location: [place];  Service: Open Heart Surgery;  Laterality: N/A;  Pump standby  . CHEST EXPLORATION  10/25/2019   Procedure: Chest Exploration for repair of coronary perforation (repair of LAD);  Surgeon: Prescott Gum, Collier Salina, MD;  Location: [place];  Service: Open Heart Surgery;;  . CORONARY CTO INTERVENTION N/A 10/25/2019   Procedure: CORONARY CTO INTERVENTION;  Surgeon: Martinique, Onis Markoff M, MD;  Location: [place] [facility];  Service: Cardiovascular;  Laterality: N/A;  . CORONARY STENT INTERVENTION N/A 10/17/2019   Procedure: CORONARY STENT INTERVENTION;  Surgeon: Sherren Mocha, MD;  Location: [place] [facility];  Service: Cardiovascular;  Laterality: N/A;  . LEFT HEART CATH AND CORONARY ANGIOGRAPHY N/A 10/17/2019   Procedure: LEFT HEART CATH AND CORONARY ANGIOGRAPHY;  Surgeon: Sherren Mocha, MD;  Location: [place] [facility];  Service: Cardiovascular;  Laterality: N/A;  . MEDIASTERNOTOMY  10/25/2019   Procedure: Median Sternotomy.;  Surgeon: [name], MD;  Location: [place];  Service: Open Heart Surgery;;  . PLACEMENT OF IMPELLA LEFT VENTRICULAR ASSIST DEVICE  10/30/2019   Procedure: Placement Of Impella Left Ventricular Assist Device 5.5 USING 10MM HEMASHIELD PLATINUM GRAFT;  Surgeon: [name], MD;  Location: [place];  Service: Open Heart Surgery;;  graft placed into aorta  . REMOVAL OF IMPELLA LEFT VENTRICULAR ASSIST DEVICE N/A 11/03/2019   Procedure: REMOVAL OF IMPELLA 5.5 LEFT VENTRICULAR ASSIST DEVICE;  Surgeon: [name], MD;  Location: [place];  Service: Open Heart Surgery;  Laterality: N/A;  . STERNAL CLOSURE N/A 10/30/2019   Procedure: Sternal Closure;  Surgeon: [name], MD;  Location: [place];  Service: Open Heart Surgery;  Laterality: N/A;  . TEE WITHOUT CARDIOVERSION N/A 10/25/2019   Procedure: TRANSESOPHAGEAL ECHOCARDIOGRAM (TEE);  Surgeon: Prescott Gum, Collier Salina, MD;  Location: [place];  Service: Open Heart Surgery;  Laterality: N/A;  . TEE WITHOUT CARDIOVERSION N/A 10/30/2019   Procedure: TRANSESOPHAGEAL ECHOCARDIOGRAM (TEE);  Surgeon: Prescott Gum, Collier Salina, MD;  Location: [place];  Service: Open Heart Surgery;  Laterality: N/A;  . TEE WITHOUT CARDIOVERSION N/A 11/03/2019   Procedure: TRANSESOPHAGEAL ECHOCARDIOGRAM (TEE);  Surgeon: Prescott Gum, Collier Salina,  MD;  Location[facility];  Service: Open Heart Surgery;  Laterality: N/A;    Family History  Problem Relation Age of Onset  . Heart attack Father     Social History Social History   Tobacco Use  . Smoking status: Former Smoker    Quit date: 10/23/2019    Years since quitting: 0.1  . Smokeless tobacco: Never Used  Substance  Use Topics  . Alcohol use: Yes  . Drug use: No    Current Outpatient Medications  Medication Sig Dispense Refill  . amiodarone (PACERONE) 200 MG tablet Take 1 tablet (200 mg total) by mouth 2 (two) times daily. 60 tablet 1  . atorvastatin (LIPITOR) 40 MG tablet Take 1 tablet (40 mg total) by mouth daily. 30 tablet 1  . carvedilol (COREG) 3.125 MG tablet Take 1 tablet (3.125 mg total) by mouth 2 (two) times daily with a meal. 180 tablet 1  . cetirizine (ZYRTEC) 10 MG tablet Take 10 mg by mouth daily.    . clopidogrel (PLAVIX) 75 MG tablet Take 1 tablet (75 mg total) by mouth daily. 90 tablet 2  . feeding supplement, ENSURE ENLIVE, (ENSURE ENLIVE) LIQD Take 237 mLs by mouth 3 (three) times daily between meals. 237 mL 12  . gabapentin (NEURONTIN) 300 MG capsule Take 1 capsule (300 mg total) by mouth 3 (three) times daily. 90 capsule 1  . ketotifen (ZADITOR) 0.025 % ophthalmic solution Place 1 drop into both eyes 2 (two) times daily. 5 mL 0  . oxyCODONE (OXY IR/ROXICODONE) 5 MG immediate release tablet Take 1 tablet (5 mg total) by mouth every 4 (four) hours as needed for severe pain. 30 tablet 0  . sacubitril-valsartan (ENTRESTO) 97-103 MG Take 1 tablet by mouth 2 (two) times daily. 60 tablet 1  . spironolactone (ALDACTONE) 25 MG tablet Take 1 tablet (25 mg total) by mouth daily. 30 tablet 1  . thiamine 100 MG tablet Take 1 tablet (100 mg total) by mouth daily. 30 tablet 1  . torsemide (DEMADEX) 20 MG tablet Take 20 mg by mouth every other day.    . warfarin (COUMADIN) 5 MG tablet Take 2.5 mg by mouth daily.     No current facility-administered medications for this visit.    No Known Allergies  Review of Systems  No fever No shortness of breath No ankle edema  BP 113/73   Pulse 70   Resp 20   Ht 5' 8.5" (1.74 m)   Wt 178 lb (80.7 kg)   SpO2 97% Comment: on ra  BMI 26.67 kg/m  Physical Exam      Exam    General- alert and comfortable.  Sternal incision healing well.  Still  sensitive to touch.    Neck- no JVD, no cervical adenopathy palpable, no carotid bruit   Lungs- clear without rales, wheezes   Cor- regular rate and rhythm, no murmur , gallop   Abdomen- soft, non-tender   Extremities - warm, non-tender, minimal edema   Neuro- oriented, appropriate, no focal weakness   Diagnostic Tests: None  Impression: Reduce oxycodone to 3 times daily Increase at bedtime dose of gabapentin to 600 mg Return for review of progress in 2 weeks Decrease amiodarone to 1 tablet daily Patient may drive and lift no more than 10 pounds.    Len Childs, MD [facility] [phone]

## 2019-12-25 ENCOUNTER — Telehealth: Payer: Self-pay | Admitting: Cardiovascular Disease

## 2019-12-25 NOTE — Telephone Encounter (Signed)
Follow Up:    William Crawford is calling to check on the orders that she sent over on 11-22-19. They were supposed to have been returned by 12-06-19.

## 2019-12-26 ENCOUNTER — Other Ambulatory Visit: Payer: Self-pay

## 2019-12-26 ENCOUNTER — Ambulatory Visit (INDEPENDENT_AMBULATORY_CARE_PROVIDER_SITE_OTHER): Payer: BLUE CROSS/BLUE SHIELD | Admitting: Pharmacist

## 2019-12-26 DIAGNOSIS — I513 Intracardiac thrombosis, not elsewhere classified: Secondary | ICD-10-CM

## 2019-12-26 DIAGNOSIS — Z5181 Encounter for therapeutic drug level monitoring: Secondary | ICD-10-CM

## 2019-12-26 LAB — POCT INR: INR: 3.1 — AB (ref 2.0–3.0)

## 2019-12-26 NOTE — Patient Instructions (Signed)
Description   Eat some green leafy vegetables today and continue taking Warfarin 1/2 tablet daily. Recheck INR in 2 week. [phone].

## 2020-01-03 ENCOUNTER — Ambulatory Visit (INDEPENDENT_AMBULATORY_CARE_PROVIDER_SITE_OTHER): Payer: Self-pay | Admitting: Cardiothoracic Surgery

## 2020-01-03 ENCOUNTER — Other Ambulatory Visit: Payer: Self-pay

## 2020-01-03 ENCOUNTER — Encounter: Payer: Self-pay | Admitting: Cardiothoracic Surgery

## 2020-01-03 VITALS — BP 118/80 | HR 72 | Temp 97.8°F | Resp 20 | Ht 68.5 in | Wt 176.0 lb

## 2020-01-03 DIAGNOSIS — Z9889 Other specified postprocedural states: Secondary | ICD-10-CM

## 2020-01-03 DIAGNOSIS — Z9281 Personal history of extracorporeal membrane oxygenation (ECMO): Secondary | ICD-10-CM

## 2020-01-03 DIAGNOSIS — Z95811 Presence of heart assist device: Secondary | ICD-10-CM

## 2020-01-03 DIAGNOSIS — Z4889 Encounter for other specified surgical aftercare: Secondary | ICD-10-CM

## 2020-01-03 MED ORDER — OXYCODONE HCL 5 MG PO TABS: 5.0000 mg | ORAL_TABLET | Freq: Two times a day (BID) | ORAL | 0 refills | Status: DC | PRN

## 2020-01-03 MED ORDER — GABAPENTIN 300 MG PO CAPS: 300.0000 mg | ORAL_CAPSULE | Freq: Three times a day (TID) | ORAL | 1 refills | Status: DC

## 2020-01-03 NOTE — Progress Notes (Signed)
PCP is Patient, No Pcp Per Referring Provider is Sherren Mocha, MD  Chief Complaint  Patient presents with  . Routine Post Op    2 week f/u    HPI: The patient returns for postop check and discussion of his neuropathic pain.  We are tapering off his oral oxycodone for poststernotomy surgical pain.  His pain is mainly across his pectoral area bilaterally. He has been taking oxycodone 3 times a day and this will be reduced to twice a day.  He is encouraged to walk 20 minutes a day.  He can now drive and lift up to 10 pounds.  Past Medical History:  Diagnosis Date  . Alcohol abuse   . GERD (gastroesophageal reflux disease)   . Tobacco abuse     Past Surgical History:  Procedure Laterality Date  . APPLICATION OF WOUND VAC  10/25/2019   Procedure: Application Of Wound Vac;  Surgeon: [name], MD;  Location: [place];  Service: Open Heart Surgery;;  . CANNULATION FOR ECMO (EXTRACORPOREAL MEMBRANE OXYGENATION)  10/25/2019   Procedure: Cannulation For Ecmo (Extracorporeal Membrane Oxygenation) @ 7062;  Surgeon: [name], MD;  Location: [place];  Service: Open Heart Surgery;;  . CANNULATION FOR ECMO (EXTRACORPOREAL MEMBRANE OXYGENATION) N/A 10/30/2019   Procedure: DECANNULATION OF ECMO (EXTRACORPOREAL MEMBRANE OXYGENATION);  Surgeon: Prescott Gum, Collier Salina, MD;  Location: [place];  Service: Open Heart Surgery;  Laterality: N/A;  Pump standby  . CHEST EXPLORATION  10/25/2019   Procedure: Chest Exploration for repair of coronary perforation (repair of LAD);  Surgeon: Prescott Gum, Collier Salina, MD;  Location: [place];  Service: Open Heart Surgery;;  . CORONARY CTO INTERVENTION N/A 10/25/2019   Procedure: CORONARY CTO INTERVENTION;  Surgeon: Martinique, Taber Sweetser M, MD;  Location: [place] [facility];  Service: Cardiovascular;  Laterality: N/A;  . CORONARY STENT INTERVENTION N/A 10/17/2019   Procedure: CORONARY STENT INTERVENTION;  Surgeon: Sherren Mocha, MD;  Location: [place] [facility];  Service: Cardiovascular;   Laterality: N/A;  . LEFT HEART CATH AND CORONARY ANGIOGRAPHY N/A 10/17/2019   Procedure: LEFT HEART CATH AND CORONARY ANGIOGRAPHY;  Surgeon: Sherren Mocha, MD;  Location: [place] [facility];  Service: Cardiovascular;  Laterality: N/A;  . MEDIASTERNOTOMY  10/25/2019   Procedure: Median Sternotomy.;  Surgeon: [name], MD;  Location: [place];  Service: Open Heart Surgery;;  . PLACEMENT OF IMPELLA LEFT VENTRICULAR ASSIST DEVICE  10/30/2019   Procedure: Placement Of Impella Left Ventricular Assist Device 5.5 USING 10MM HEMASHIELD PLATINUM GRAFT;  Surgeon: [name], MD;  Location: [place];  Service: Open Heart Surgery;;  graft placed into aorta  . REMOVAL OF IMPELLA LEFT VENTRICULAR ASSIST DEVICE N/A 11/03/2019   Procedure: REMOVAL OF IMPELLA 5.5 LEFT VENTRICULAR ASSIST DEVICE;  Surgeon: [name], MD;  Location: [place];  Service: Open Heart Surgery;  Laterality: N/A;  . STERNAL CLOSURE N/A 10/30/2019   Procedure: Sternal Closure;  Surgeon: [name], MD;  Location: [place];  Service: Open Heart Surgery;  Laterality: N/A;  . TEE WITHOUT CARDIOVERSION N/A 10/25/2019   Procedure: TRANSESOPHAGEAL ECHOCARDIOGRAM (TEE);  Surgeon: Prescott Gum, Collier Salina, MD;  Location: [place];  Service: Open Heart Surgery;  Laterality: N/A;  . TEE WITHOUT CARDIOVERSION N/A 10/30/2019   Procedure: TRANSESOPHAGEAL ECHOCARDIOGRAM (TEE);  Surgeon: Prescott Gum, Collier Salina, MD;  Location: [place];  Service: Open Heart Surgery;  Laterality: N/A;  . TEE WITHOUT CARDIOVERSION N/A 11/03/2019   Procedure: TRANSESOPHAGEAL ECHOCARDIOGRAM (TEE);  Surgeon: Prescott Gum, Collier Salina,  MD;  Location[facility];  Service: Open Heart Surgery;  Laterality: N/A;    Family History  Problem Relation Age of Onset  . Heart attack Father     Social History Social History   Tobacco Use  . Smoking status: Former Smoker    Quit date: 10/23/2019    Years since quitting: 0.1  . Smokeless tobacco: Never Used  Substance Use Topics  . Alcohol use: Yes  . Drug  use: No    Current Outpatient Medications  Medication Sig Dispense Refill  . amiodarone (PACERONE) 200 MG tablet Take 1 tablet (200 mg total) by mouth 2 (two) times daily. 60 tablet 1  . atorvastatin (LIPITOR) 40 MG tablet Take 1 tablet (40 mg total) by mouth daily. 30 tablet 1  . carvedilol (COREG) 3.125 MG tablet Take 1 tablet (3.125 mg total) by mouth 2 (two) times daily with a meal. 180 tablet 1  . cetirizine (ZYRTEC) 10 MG tablet Take 10 mg by mouth daily.    . clopidogrel (PLAVIX) 75 MG tablet Take 1 tablet (75 mg total) by mouth daily. 90 tablet 2  . feeding supplement, ENSURE ENLIVE, (ENSURE ENLIVE) LIQD Take 237 mLs by mouth 3 (three) times daily between meals. 237 mL 12  . gabapentin (NEURONTIN) 300 MG capsule Take 1 capsule (300 mg total) by mouth 3 (three) times daily. 90 capsule 1  . ketotifen (ZADITOR) 0.025 % ophthalmic solution Place 1 drop into both eyes 2 (two) times daily. 5 mL 0  . oxyCODONE (OXY IR/ROXICODONE) 5 MG immediate release tablet Take 1 tablet (5 mg total) by mouth in the morning, at noon, and at bedtime for 42 doses. 42 tablet 0  . sacubitril-valsartan (ENTRESTO) 97-103 MG Take 1 tablet by mouth 2 (two) times daily. 60 tablet 1  . spironolactone (ALDACTONE) 25 MG tablet Take 1 tablet (25 mg total) by mouth daily. 30 tablet 1  . thiamine 100 MG tablet Take 1 tablet (100 mg total) by mouth daily. 30 tablet 1  . torsemide (DEMADEX) 20 MG tablet Take 20 mg by mouth every other day.    . warfarin (COUMADIN) 5 MG tablet Take 2.5 mg by mouth daily.     No current facility-administered medications for this visit.    No Known Allergies  Review of Systems   Complaining of some sinus congestion. No edema, Some orthostatic dizziness. No palpitations  BP 118/80   Pulse 72   Temp 97.8 F (36.6 C) (Skin)   Resp 20   Ht 5' 8.5" (1.74 m)   Wt 176 lb (79.8 kg)   SpO2 98%   BMI 26.37 kg/m  Physical Exam       Exam    General- alert and comfortable    Neck-  no JVD, no cervical adenopathy palpable, no carotid bruit   Lungs- clear without rales, wheezes   Cor- regular rate and rhythm, no murmur , gallop   Abdomen- soft, non-tender   Extremities - warm, non-tender, minimal edema   Neuro- oriented, appropriate, no focal weakness Surgical incisions all healing well.  Diagnostic Tests: None  Impression: Continued progress. Patient is questioning when he can return to work. Continue to wean off narcotics, increase activity levels and follow heart healthy diet.  Plan: Return for review of progress in 2 weeks.   Len Childs, MD [facility] [phone]

## 2020-01-05 ENCOUNTER — Encounter (HOSPITAL_COMMUNITY): Payer: Self-pay | Admitting: Cardiology

## 2020-01-05 ENCOUNTER — Other Ambulatory Visit: Payer: Self-pay

## 2020-01-05 ENCOUNTER — Ambulatory Visit (HOSPITAL_COMMUNITY)
Admission: RE | Admit: 2020-01-05 | Discharge: 2020-01-05 | Disposition: A | Discharge status: Home / Self Care | Payer: BLUE CROSS/BLUE SHIELD | Source: Ambulatory Visit | Attending: Cardiology | Admitting: Cardiology

## 2020-01-05 VITALS — BP 94/68 | HR 66 | Wt 178.2 lb

## 2020-01-05 DIAGNOSIS — I5022 Chronic systolic (congestive) heart failure: Secondary | ICD-10-CM | POA: Diagnosis not present

## 2020-01-05 DIAGNOSIS — K219 Gastro-esophageal reflux disease without esophagitis: Secondary | ICD-10-CM | POA: Insufficient documentation

## 2020-01-05 DIAGNOSIS — I219 Acute myocardial infarction, unspecified: Secondary | ICD-10-CM | POA: Insufficient documentation

## 2020-01-05 DIAGNOSIS — Z7901 Long term (current) use of anticoagulants: Secondary | ICD-10-CM | POA: Diagnosis not present

## 2020-01-05 DIAGNOSIS — I2510 Atherosclerotic heart disease of native coronary artery without angina pectoris: Secondary | ICD-10-CM | POA: Diagnosis not present

## 2020-01-05 DIAGNOSIS — I255 Ischemic cardiomyopathy: Secondary | ICD-10-CM | POA: Insufficient documentation

## 2020-01-05 DIAGNOSIS — I38 Endocarditis, valve unspecified: Secondary | ICD-10-CM

## 2020-01-05 DIAGNOSIS — I252 Old myocardial infarction: Secondary | ICD-10-CM | POA: Diagnosis not present

## 2020-01-05 DIAGNOSIS — Z87891 Personal history of nicotine dependence: Secondary | ICD-10-CM | POA: Diagnosis not present

## 2020-01-05 DIAGNOSIS — Z8674 Personal history of sudden cardiac arrest: Secondary | ICD-10-CM | POA: Diagnosis not present

## 2020-01-05 DIAGNOSIS — Z8249 Family history of ischemic heart disease and other diseases of the circulatory system: Secondary | ICD-10-CM | POA: Diagnosis not present

## 2020-01-05 DIAGNOSIS — F1010 Alcohol abuse, uncomplicated: Secondary | ICD-10-CM | POA: Diagnosis not present

## 2020-01-05 DIAGNOSIS — E785 Hyperlipidemia, unspecified: Secondary | ICD-10-CM | POA: Diagnosis not present

## 2020-01-05 DIAGNOSIS — Z79899 Other long term (current) drug therapy: Secondary | ICD-10-CM | POA: Insufficient documentation

## 2020-01-05 DIAGNOSIS — I509 Heart failure, unspecified: Secondary | ICD-10-CM | POA: Diagnosis not present

## 2020-01-05 LAB — LIPID PANEL
Cholesterol: 154 mg/dL (ref 0–200)
HDL: 37 mg/dL — ABNORMAL LOW (ref >40)
LDL Cholesterol: 67 mg/dL (ref 0–99)
Total CHOL/HDL Ratio: 4.2 RATIO
Triglycerides: 248 mg/dL — ABNORMAL HIGH (ref <150)
VLDL: 50 mg/dL — ABNORMAL HIGH (ref 0–40)

## 2020-01-05 LAB — COMPREHENSIVE METABOLIC PANEL
ALT: 27 U/L (ref 0–44)
AST: 18 U/L (ref 15–41)
Albumin: 4.2 g/dL (ref 3.5–5.0)
Alkaline Phosphatase: 102 U/L (ref 38–126)
Anion gap: 11 (ref 5–15)
BUN: 13 mg/dL (ref 6–20)
CO2: 26 mmol/L (ref 22–32)
Calcium: 9.1 mg/dL (ref 8.9–10.3)
Chloride: 100 mmol/L (ref 98–111)
Creatinine, Ser: 1.04 mg/dL (ref 0.61–1.24)
GFR calc Af Amer: >60 mL/min (ref >60)
GFR calc non Af Amer: >60 mL/min (ref >60)
Glucose, Bld: 90 mg/dL (ref 70–99)
Potassium: 3.9 mmol/L (ref 3.5–5.1)
Sodium: 137 mmol/L (ref 135–145)
Total Bilirubin: 0.7 mg/dL (ref 0.3–1.2)
Total Protein: 6.8 g/dL (ref 6.5–8.1)

## 2020-01-05 LAB — CBC
HCT: 40.2 % (ref 39.0–52.0)
Hemoglobin: 13.4 g/dL (ref 13.0–17.0)
MCH: 28.8 pg (ref 26.0–34.0)
MCHC: 33.3 g/dL (ref 30.0–36.0)
MCV: 86.3 fL (ref 80.0–100.0)
Platelets: 365 10*3/uL (ref 150–400)
RBC: 4.66 MIL/uL (ref 4.22–5.81)
RDW: 13.2 % (ref 11.5–15.5)
WBC: 5.4 10*3/uL (ref 4.0–10.5)
nRBC: 0 % (ref 0.0–0.2)

## 2020-01-05 LAB — TSH: TSH: 3.113 u[IU]/mL (ref 0.350–4.500)

## 2020-01-05 MED ORDER — CARVEDILOL 6.25 MG PO TABS: 3.1250 mg | ORAL_TABLET | Freq: Two times a day (BID) | ORAL | 3 refills | Status: DC

## 2020-01-05 NOTE — Patient Instructions (Signed)
INCREASE Carvedilol 6.75m (1 tablet) Twice daily   Labs done today, your results will be available in MyChart, we will contact you for abnormal readings.   Follow up appointment with echocardiogram in 6 weeks   Your physician has requested that you have an echocardiogram. Echocardiography is a painless test that uses sound waves to create images of your heart. It provides your doctor with information about the size and shape of your heart and how well your heart's chambers and valves are working. This procedure takes approximately one hour. There are no restrictions for this procedure.   If you have any questions or concerns before your next appointment please send [place] message through [place] call our office at [phone]    TO LEAVE A MESSAGE FOR THE NURSE SELECT OPTION 2, PLEASE LEAVE A MESSAGE INCLUDING: . YOUR NAME . DATE OF BIRTH . CALL BACK NUMBER . REASON FOR CALL**this is important as we prioritize the call backs  YLake RipleyAS LONG AS YOU CALL BEFORE 4:00 PM   At the [facility] you and your health needs are our priority. As part of our continuing mission to provide you with exceptional heart care, we have created designated Provider Care Teams. These Care Teams include your primary Cardiologist (physician) and Advanced Practice Providers (APPs- Physician Assistants and Nurse Practitioners) who all work together to provide you with the care you need, when you need it.   You may see any of the following providers on your designated Care Team at your next follow up: .Marland KitchenDr DGlori Bickers. Dr DLoralie Champagne. ADarrick Grinder NP . BLyda Jester PA . LAudry Riles PharmD   Please be sure to bring in all your medications bottles to every appointment.

## 2020-01-07 NOTE — Progress Notes (Signed)
PCP: Patient, No Pcp Per  [facility]: Dr. Aundra Dubin  49 y.o. with history of heavy smoking, heavy ETOH, and strong FH of CAD was initially admitted to [facility] on 10/17/19 with chest pain and NSTEMI.  LHC showed 95% distal RCA (culprit) with chronically occluded proximal LAD and OM1 with collaterals.  He had DES to distal RCA initially.  Echo showed EF 35-40% with inferior wall motion abnormality and normal RV.  He was discharged home with plan for CTO intervention on LAD.  He came for elective CTO procedure in 6/21, however he had perforation of the LAD with balloon inflation. A covered stent was placed.  Echo showed EF 45% with apical hypokinesis, moderate pericardial effusion and evidence for tamponade.  He was then taken to the OR for LAD repair and drainage of pericardial effusion.   In the OR, he had a VF arrest with about 15 minutes of CPR.  He was put on cardiopulmonary bypass and the tear in the LAD was repaired.  The LAD was not graftable.  TEE in the OR showed severe biventricular failure, suspect stunning post-VF arrest.  He was unable to come off bypass.  Therefore, he was cannulated for ECMO with venous drainage from RA and arterial return to ascending aorta.  After 4 days, ECMO was decannulated and Impella 5.5 was placed.  4 days after that, Impella was removed. He was eventually weaned off pressors/inotropes and discharged home.  The last echo done 11/03/19 showed EF up to 45-50%.    Patient has been doing well since discharge.  SBP running in the 110s-120s range when he checks at home. No dyspnea walking on flat ground or up a flight of stairs.  No chest pain. No lightheadedness.  He has quit smoking.    ECG (personally reviewed): NSR, RBBB, LPFB  Labs (7/21): K 4.1, creatinine 1.08  PMH: 1. ETOH abuse 2. Smoking 3. GERD 4. CAD: NSTEMI in 5/21 with 95% distal RCA (culprit) and CTO LAD and OM1.  He had DES to distal RCA.  - He returned in 6/21 for CTO procedure to LAD, complicated  by perforation of the LAD.  He was taken to the OR emergently for repair of the perforation.  Unable to graft the LAD.  5. VF arrest: 6/21 at time of LAD perforation/tamponade. 6. Ischemic cardiomyopathy:  - Echo (5/21): EF 35-40%. - Echo (6/21, post arrest): EF < 20%, moderately reduced RV function.  - ECMO in 6/21 post-LAD perforation and arrest.  - Echo (11/03/19): EF 45-50%.  7. Right atrial thrombus: Noted post-ECMO.  Warfarin.   Social History   Socioeconomic History  . Marital status: Single    Spouse name: Not on file  . Number of children: Not on file  . Years of education: Not on file  . Highest education level: Not on file  Occupational History  . Not on file  Tobacco Use  . Smoking status: Former Smoker    Quit date: 10/23/2019    Years since quitting: 0.2  . Smokeless tobacco: Never Used  Substance and Sexual Activity  . Alcohol use: Yes  . Drug use: No  . Sexual activity: Not on file  Other Topics Concern  . Not on file  Social History Narrative  . Not on file   Social Determinants of Health   Financial Resource Strain:   . Difficulty of Paying Living Expenses:   Food Insecurity:   . Worried About Charity fundraiser in the Last Year:   .  Ran Out of Food in the Last Year:   Transportation Needs:   . Film/video editor (Medical):   Marland Kitchen Lack of Transportation (Non-Medical):   Physical Activity:   . Days of Exercise per Week:   . Minutes of Exercise per Session:   Stress:   . Feeling of Stress :   Social Connections:   . Frequency of Communication with Friends and Family:   . Frequency of Social Gatherings with Friends and Family:   . Attends Religious Services:   . Active Member of Clubs or Organizations:   . Attends Archivist Meetings:   Marland Kitchen Marital Status:   Intimate Partner Violence:   . Fear of Current or Ex-Partner:   . Emotionally Abused:   Marland Kitchen Physically Abused:   . Sexually Abused:    Family History  Problem Relation Age of  Onset  . Heart attack Father    ROS: All systems reviewed and negative except as per HPI.   Current Outpatient Medications  Medication Sig Dispense Refill  . amiodarone (PACERONE) 200 MG tablet Take 1 tablet (200 mg total) by mouth 2 (two) times daily. (Patient taking differently: Take 200 mg by mouth daily. ) 60 tablet 1  . atorvastatin (LIPITOR) 40 MG tablet Take 1 tablet (40 mg total) by mouth daily. 30 tablet 1  . carvedilol (COREG) 6.25 MG tablet Take 0.5 tablets (3.125 mg total) by mouth 2 (two) times daily with a meal. 60 tablet 3  . cetirizine (ZYRTEC) 10 MG tablet Take 10 mg by mouth daily.    . clopidogrel (PLAVIX) 75 MG tablet Take 1 tablet (75 mg total) by mouth daily. 90 tablet 2  . feeding supplement, ENSURE ENLIVE, (ENSURE ENLIVE) LIQD Take 237 mLs by mouth 3 (three) times daily between meals. 237 mL 12  . gabapentin (NEURONTIN) 300 MG capsule Take 1 capsule (300 mg total) by mouth 3 (three) times daily. 90 capsule 1  . ketotifen (ZADITOR) 0.025 % ophthalmic solution Place 1 drop into both eyes 2 (two) times daily. (Patient taking differently: Place 1 drop into both eyes daily as needed. ) 5 mL 0  . oxyCODONE (OXY IR/ROXICODONE) 5 MG immediate release tablet Take 1 tablet (5 mg total) by mouth 2 (two) times daily as needed for up to 14 days for severe pain. 28 tablet 0  . sacubitril-valsartan (ENTRESTO) 97-103 MG Take 1 tablet by mouth 2 (two) times daily. 60 tablet 1  . spironolactone (ALDACTONE) 25 MG tablet Take 1 tablet (25 mg total) by mouth daily. 30 tablet 1  . thiamine 100 MG tablet Take 1 tablet (100 mg total) by mouth daily. 30 tablet 1  . torsemide (DEMADEX) 20 MG tablet Take 20 mg by mouth every other day.    . warfarin (COUMADIN) 5 MG tablet Take 2.5 mg by mouth daily.     No current facility-administered medications for this encounter.   BP 94/68   Pulse 66   Wt 80.8 kg   SpO2 98%   BMI 26.70 kg/m  General: NAD Neck: No JVD, no thyromegaly or thyroid  nodule.  Lungs: Clear to auscultation bilaterally with normal respiratory effort. CV: Nondisplaced PMI.  Heart regular S1/S2, no S3/S4, no murmur.  No peripheral edema.  No carotid bruit.  Normal pedal pulses.  Abdomen: Soft, nontender, no hepatosplenomegaly, no distention.  Skin: Intact without lesions or rashes.  Neurologic: Alert and oriented x 3.  Psych: Normal affect. Extremities: No clubbing or cyanosis.  HEENT: Normal.  Assessment/Plan: 1. CAD: NSTEMI in 5/21 with DES to distal RCA.  He was left with occluded OM1 and LAD. CTO procedure was attempted to open LAD, but complicated by LAD perforation and tamponade/cardiac arrest requiring trip to OR and [facility] ECMO. The LAD was not graftable, he has no perfusion to the LAD territory except by collaterals.  No chest pain.  - Continue warfarin (not on ASA therefore).  - Continue atorvastatin.  Lipids today.  - I will refer for cardiac rehab.  2. Cardiac arrest: In OR after LAD perforation/tamponade.  - He is on amiodarone 200 mg daily. Check LFTs and TSH today.  He will need regular eye exam while on amiodarone.   - I will arrange for echo in 6 wks.  If EF < 35%, he will need ICD and stop amiodarone.  If EF > 35%, will stop amiodarone.  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  He is on warfarin currently.  - I will probably stop warfarin and start ASA 81 daily if no thrombus noted on followup echo.  4. Smoking: I congratulated him on cessation.  5. ETOH abuse: Recommended abstinence.  6. Chronic systolic CHF: Ischemic cardiomyopathy.  Echo after cardiac arrest in 6/21 showed EF < 20% with moderate RV dysfunction.  Echo in 6/21 after decannulation of ECMO and removal of Impella 5.5 showed EF up to 45-50%.  On exam, he is not volume overloaded.  NYHA class I-II.  - Increase Coreg to 6.25 mg bid.   - Continue Entresto 97/103 bid and spironolactone 25 mg daily. - Continue torsemide 20 mg every other day.  - Repeat  echo for ICD in 6 wks as above.   Followup with me in 6 wks with echo.   Loralie Champagne 01/07/2020

## 2020-01-09 ENCOUNTER — Ambulatory Visit (INDEPENDENT_AMBULATORY_CARE_PROVIDER_SITE_OTHER): Payer: BLUE CROSS/BLUE SHIELD

## 2020-01-09 ENCOUNTER — Other Ambulatory Visit: Payer: Self-pay

## 2020-01-09 DIAGNOSIS — Z5181 Encounter for therapeutic drug level monitoring: Secondary | ICD-10-CM | POA: Diagnosis not present

## 2020-01-09 DIAGNOSIS — I513 Intracardiac thrombosis, not elsewhere classified: Secondary | ICD-10-CM

## 2020-01-09 LAB — POCT INR: INR: 3.6 — AB (ref 2.0–3.0)

## 2020-01-09 MED ORDER — WARFARIN SODIUM 2.5 MG PO TABS
ORAL_TABLET | ORAL | 1 refills | Status: DC
Start: 2020-01-09 — End: 2020-02-20

## 2020-01-09 NOTE — Patient Instructions (Signed)
Description   Skip today's dosage of Warfarin, then resume same dosage 2.61m daily.  Recheck INR in 2 week. Changing tablet size from 519m(peach) tablet to 2.33m733mgreen) tablet.  [id number]

## 2020-01-12 ENCOUNTER — Telehealth (HOSPITAL_COMMUNITY): Payer: Self-pay

## 2020-01-12 MED ORDER — ICOSAPENT ETHYL 1 G PO CAPS
2.0000 g | ORAL_CAPSULE | Freq: Two times a day (BID) | ORAL | 5 refills | Status: DC
Start: 2020-01-12 — End: 2020-08-26

## 2020-01-12 NOTE — Telephone Encounter (Signed)
-----   Message from Larey Dresser, MD sent at 01/05/2020  5:01 PM EDT ----- Labs ok except for high triglycerides.  Start Vascepa 2 g bid.

## 2020-01-12 NOTE — Telephone Encounter (Signed)
Patient advised and verbalized understanding.pt is agreeable with plan. New rx sent in    Meds ordered this encounter  Medications  . icosapent Ethyl (VASCEPA) 1 g capsule    Sig: Take 2 capsules (2 g total) by mouth 2 (two) times daily.    Dispense:  120 capsule    Refill:  5

## 2020-01-14 DIAGNOSIS — I2510 Atherosclerotic heart disease of native coronary artery without angina pectoris: Secondary | ICD-10-CM | POA: Diagnosis not present

## 2020-01-14 DIAGNOSIS — Z48812 Encounter for surgical aftercare following surgery on the circulatory system: Secondary | ICD-10-CM | POA: Diagnosis not present

## 2020-01-14 DIAGNOSIS — I314 Cardiac tamponade: Secondary | ICD-10-CM | POA: Diagnosis not present

## 2020-01-14 DIAGNOSIS — Z955 Presence of coronary angioplasty implant and graft: Secondary | ICD-10-CM | POA: Diagnosis not present

## 2020-01-14 DIAGNOSIS — G9340 Encephalopathy, unspecified: Secondary | ICD-10-CM | POA: Diagnosis not present

## 2020-01-14 DIAGNOSIS — E43 Unspecified severe protein-calorie malnutrition: Secondary | ICD-10-CM | POA: Diagnosis not present

## 2020-01-14 DIAGNOSIS — Z7901 Long term (current) use of anticoagulants: Secondary | ICD-10-CM | POA: Diagnosis not present

## 2020-01-14 DIAGNOSIS — F1010 Alcohol abuse, uncomplicated: Secondary | ICD-10-CM | POA: Diagnosis not present

## 2020-01-14 DIAGNOSIS — F17210 Nicotine dependence, cigarettes, uncomplicated: Secondary | ICD-10-CM | POA: Diagnosis not present

## 2020-01-14 DIAGNOSIS — J9601 Acute respiratory failure with hypoxia: Secondary | ICD-10-CM | POA: Diagnosis not present

## 2020-01-15 ENCOUNTER — Encounter (HOSPITAL_COMMUNITY): Payer: BLUE CROSS/BLUE SHIELD | Admitting: Internal Medicine

## 2020-01-17 ENCOUNTER — Ambulatory Visit (INDEPENDENT_AMBULATORY_CARE_PROVIDER_SITE_OTHER): Payer: Self-pay | Admitting: Cardiothoracic Surgery

## 2020-01-17 ENCOUNTER — Encounter: Payer: Self-pay | Admitting: Cardiothoracic Surgery

## 2020-01-17 ENCOUNTER — Other Ambulatory Visit: Payer: Self-pay

## 2020-01-17 VITALS — BP 124/82 | HR 73 | Temp 98.2°F | Resp 16 | Ht 68.5 in | Wt 178.0 lb

## 2020-01-17 DIAGNOSIS — R0789 Other chest pain: Secondary | ICD-10-CM

## 2020-01-17 DIAGNOSIS — Z9889 Other specified postprocedural states: Secondary | ICD-10-CM

## 2020-01-17 MED ORDER — GABAPENTIN 300 MG PO CAPS: 600.0000 mg | ORAL_CAPSULE | Freq: Three times a day (TID) | ORAL | 1 refills | Status: DC

## 2020-01-17 MED ORDER — OXYCODONE HCL 5 MG PO TABS: 5.0000 mg | ORAL_TABLET | Freq: Three times a day (TID) | ORAL | 0 refills | Status: AC

## 2020-01-17 NOTE — Progress Notes (Signed)
PCP is Patient, No Pcp Per Referring Provider is Sherren Mocha, MD  Chief Complaint  Patient presents with  . 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  The patient is recovering but he still has significant neuropathic pain located in the subpectoral region bilaterally due to his young age and increased chest wall muscle mass.  He has tried to reduce the oxycodone 5 mg tablet to twice a day but has significant pain in the middle part of the day.  I told the patient we will increase the dose of gabapentin from 300 mg 3 times daily to 600 mg 3 times daily and after taking oxycodone 3 times daily for 1 week he will cut back to 2 times daily on the oxycodone   Past Medical History:  Diagnosis Date  . Alcohol abuse   . GERD (gastroesophageal reflux disease)   . Tobacco abuse     Past Surgical History:  Procedure Laterality Date  . APPLICATION OF WOUND VAC  10/25/2019   Procedure: Application Of Wound Vac;  Surgeon: [name], MD;  Location: [place];  Service: Open Heart Surgery;;  . CANNULATION FOR ECMO (EXTRACORPOREAL MEMBRANE OXYGENATION)  10/25/2019   Procedure: Cannulation For Ecmo (Extracorporeal Membrane Oxygenation) @ 9767;  Surgeon: [name], MD;  Location: [place];  Service: Open Heart Surgery;;  . CANNULATION FOR ECMO (EXTRACORPOREAL MEMBRANE OXYGENATION) N/A 10/30/2019   Procedure: DECANNULATION OF ECMO (EXTRACORPOREAL MEMBRANE OXYGENATION);  Surgeon: Prescott Gum, Collier Salina, MD;  Location: [place];  Service: Open Heart Surgery;  Laterality: N/A;  Pump standby  . CHEST EXPLORATION  10/25/2019   Procedure: Chest Exploration for repair of coronary perforation (repair of LAD);  Surgeon: Prescott Gum, Collier Salina, MD;  Location: [place];  Service: Open Heart Surgery;;  . CORONARY CTO INTERVENTION N/A 10/25/2019    Procedure: CORONARY CTO INTERVENTION;  Surgeon: Martinique, Baylea Milburn M, MD;  Location: [place] [facility];  Service: Cardiovascular;  Laterality: N/A;  . CORONARY STENT INTERVENTION N/A 10/17/2019   Procedure: CORONARY STENT INTERVENTION;  Surgeon: Sherren Mocha, MD;  Location: [place] [facility];  Service: Cardiovascular;  Laterality: N/A;  . LEFT HEART CATH AND CORONARY ANGIOGRAPHY N/A 10/17/2019   Procedure: LEFT HEART CATH AND CORONARY ANGIOGRAPHY;  Surgeon: Sherren Mocha, MD;  Location: [place] [facility];  Service: Cardiovascular;  Laterality: N/A;  . MEDIASTERNOTOMY  10/25/2019   Procedure: Median Sternotomy.;  Surgeon: [name], MD;  Location: [place];  Service: Open Heart Surgery;;  . PLACEMENT OF IMPELLA LEFT VENTRICULAR ASSIST DEVICE  10/30/2019   Procedure: Placement Of Impella Left Ventricular Assist Device 5.5 USING 10MM HEMASHIELD PLATINUM GRAFT;  Surgeon: [name], MD;  Location: [place];  Service: Open Heart Surgery;;  graft placed into aorta  . REMOVAL OF IMPELLA LEFT VENTRICULAR ASSIST DEVICE N/A 11/03/2019   Procedure: REMOVAL OF IMPELLA 5.5 LEFT VENTRICULAR ASSIST DEVICE;  Surgeon: [name], MD;  Location: [place];  Service: Open Heart Surgery;  Laterality: N/A;  . STERNAL CLOSURE N/A 10/30/2019   Procedure: Sternal Closure;  Surgeon: [name], MD;  Location: [place];  Service: Open Heart Surgery;  Laterality: N/A;  . TEE WITHOUT CARDIOVERSION N/A 10/25/2019   Procedure: TRANSESOPHAGEAL ECHOCARDIOGRAM (TEE);  Surgeon: Prescott Gum, Collier Salina, MD;  Location[facility]  OR;  Service: Open Heart Surgery;  Laterality: N/A;  . TEE WITHOUT CARDIOVERSION N/A 10/30/2019   Procedure: TRANSESOPHAGEAL ECHOCARDIOGRAM (TEE);  Surgeon: Prescott Gum, Collier Salina, MD;  Location: [place];  Service: Open Heart Surgery;  Laterality: N/A;  . TEE WITHOUT CARDIOVERSION N/A 11/03/2019   Procedure: TRANSESOPHAGEAL ECHOCARDIOGRAM (TEE);  Surgeon: Prescott Gum, Collier Salina, MD;  Location: [place];  Service: Open Heart Surgery;   Laterality: N/A;    Family History  Problem Relation Age of Onset  . Heart attack Father     Social History Social History   Tobacco Use  . Smoking status: Former Smoker    Quit date: 10/23/2019    Years since quitting: 0.2  . Smokeless tobacco: Never Used  Substance Use Topics  . Alcohol use: Yes  . Drug use: No    Current Outpatient Medications  Medication Sig Dispense Refill  . amiodarone (PACERONE) 200 MG tablet Take 1 tablet (200 mg total) by mouth 2 (two) times daily. (Patient taking differently: Take 200 mg by mouth daily. ) 60 tablet 1  . atorvastatin (LIPITOR) 40 MG tablet Take 1 tablet (40 mg total) by mouth daily. 30 tablet 1  . carvedilol (COREG) 6.25 MG tablet Take 0.5 tablets (3.125 mg total) by mouth 2 (two) times daily with a meal. 60 tablet 3  . cetirizine (ZYRTEC) 10 MG tablet Take 10 mg by mouth daily.    . clopidogrel (PLAVIX) 75 MG tablet Take 1 tablet (75 mg total) by mouth daily. 90 tablet 2  . feeding supplement, ENSURE ENLIVE, (ENSURE ENLIVE) LIQD Take 237 mLs by mouth 3 (three) times daily between meals. 237 mL 12  . gabapentin (NEURONTIN) 300 MG capsule Take 1 capsule (300 mg total) by mouth 3 (three) times daily. 90 capsule 1  . icosapent Ethyl (VASCEPA) 1 g capsule Take 2 capsules (2 g total) by mouth 2 (two) times daily. 120 capsule 5  . ketotifen (ZADITOR) 0.025 % ophthalmic solution Place 1 drop into both eyes 2 (two) times daily. (Patient taking differently: Place 1 drop into both eyes daily as needed. ) 5 mL 0  . oxyCODONE (OXY IR/ROXICODONE) 5 MG immediate release tablet Take 1 tablet (5 mg total) by mouth 2 (two) times daily as needed for up to 14 days for severe pain. 28 tablet 0  . sacubitril-valsartan (ENTRESTO) 97-103 MG Take 1 tablet by mouth 2 (two) times daily. 60 tablet 1  . spironolactone (ALDACTONE) 25 MG tablet Take 1 tablet (25 mg total) by mouth daily. 30 tablet 1  . thiamine 100 MG tablet Take 1 tablet (100 mg total) by mouth daily.  30 tablet 1  . torsemide (DEMADEX) 20 MG tablet Take 20 mg by mouth every other day.    . warfarin (COUMADIN) 2.5 MG tablet Take as directed by [facility] 35 tablet 1   No current facility-administered medications for this visit.    No Known Allergies  Review of Systems   Patient has been seen by Dr. Aundra Dubin and his medications have been titrated  BP 124/82 (BP Location: Right Arm, Patient Position: Sitting, Cuff Size: Normal)   Pulse 73   Temp 98.2 F (36.8 C)   Resp 16   Ht 5' 8.5" (1.74 m)   Wt 178 lb (80.7 kg)   SpO2 98% Comment: RA  BMI 26.67 kg/m  Physical Exam      Exam    General- alert and comfortable.  Sternal incision well-healed and stable.  It is hypersensitive and tender.  Neck- no JVD, no cervical adenopathy palpable, no carotid bruit   Lungs- clear without rales, wheezes   Cor- regular rate and rhythm, no murmur , gallop   Abdomen- soft, non-tender   Extremities - warm, non-tender, minimal edema   Neuro- oriented, appropriate, no focal weakness   Diagnostic Tests: Chest x-ray images from today personally reviewed.  Sternal wires intact and no pleural effusion.  Impression: Neuropathic pain slowly improving.  Will increase doses of gabapentin and continue to actively wean off the oxycodone.  Plan:  Return i 1n 3 weeks to review progress discuss postoperative neuropathic pain.  Len Childs, MD [facility] [phone]

## 2020-01-23 ENCOUNTER — Other Ambulatory Visit: Payer: Self-pay

## 2020-01-23 ENCOUNTER — Ambulatory Visit (INDEPENDENT_AMBULATORY_CARE_PROVIDER_SITE_OTHER): Payer: BLUE CROSS/BLUE SHIELD

## 2020-01-23 DIAGNOSIS — I513 Intracardiac thrombosis, not elsewhere classified: Secondary | ICD-10-CM | POA: Diagnosis not present

## 2020-01-23 DIAGNOSIS — Z5181 Encounter for therapeutic drug level monitoring: Secondary | ICD-10-CM

## 2020-01-23 LAB — POCT INR: INR: 2.9 (ref 2.0–3.0)

## 2020-01-23 NOTE — Patient Instructions (Addendum)
Description   Continue on same dosage 2.38m daily.  Recheck INR in 3 weeks.  Call [place] you complete your current Amiodarone prescription prior to your next appt [facility] [phone]

## 2020-01-30 ENCOUNTER — Telehealth (HOSPITAL_COMMUNITY): Payer: Self-pay | Admitting: Pharmacy Technician

## 2020-01-30 NOTE — Telephone Encounter (Signed)
Patient Advocate Encounter   Received notification from [employer] that prior authorization for Vascepa is required.   PA submitted on CoverMyMeds Key [id number] Status is pending   Will continue to follow.

## 2020-01-30 NOTE — Telephone Encounter (Addendum)
Advanced Heart Failure Patient Advocate Encounter  Prior Authorization for [facility] [place] has been approved.    Effective dates: 01/30/20 through 01/28/21  Patients co-pay is $10  Charlann Boxer, CPhT

## 2020-02-02 ENCOUNTER — Other Ambulatory Visit (HOSPITAL_COMMUNITY): Payer: Self-pay

## 2020-02-02 MED ORDER — CLOPIDOGREL BISULFATE 75 MG PO TABS: 75.0000 mg | ORAL_TABLET | Freq: Every day | ORAL | 3 refills | Status: DC

## 2020-02-02 MED ORDER — ATORVASTATIN CALCIUM 40 MG PO TABS: 40.0000 mg | ORAL_TABLET | Freq: Every day | ORAL | 3 refills | Status: DC

## 2020-02-02 MED ORDER — TORSEMIDE 20 MG PO TABS: 20.0000 mg | ORAL_TABLET | ORAL | 3 refills | Status: DC

## 2020-02-02 NOTE — Telephone Encounter (Signed)
Meds ordered this encounter  Medications  . torsemide (DEMADEX) 20 MG tablet    Sig: Take 1 tablet (20 mg total) by mouth every other day.    Dispense:  45 tablet    Refill:  3  . clopidogrel (PLAVIX) 75 MG tablet    Sig: Take 1 tablet (75 mg total) by mouth daily.    Dispense:  90 tablet    Refill:  3  . atorvastatin (LIPITOR) 40 MG tablet    Sig: Take 1 tablet (40 mg total) by mouth daily.    Dispense:  90 tablet    Refill:  3

## 2020-02-07 ENCOUNTER — Other Ambulatory Visit: Payer: Self-pay

## 2020-02-07 ENCOUNTER — Encounter: Payer: Self-pay | Admitting: Cardiothoracic Surgery

## 2020-02-07 ENCOUNTER — Ambulatory Visit: Payer: BLUE CROSS/BLUE SHIELD | Admitting: Cardiothoracic Surgery

## 2020-02-07 VITALS — BP 112/77 | HR 66 | Temp 97.6°F | Resp 20 | Ht 68.5 in | Wt 177.0 lb

## 2020-02-07 DIAGNOSIS — Z9281 Personal history of extracorporeal membrane oxygenation (ECMO): Secondary | ICD-10-CM

## 2020-02-07 DIAGNOSIS — Z9889 Other specified postprocedural states: Secondary | ICD-10-CM | POA: Diagnosis not present

## 2020-02-07 DIAGNOSIS — Z95811 Presence of heart assist device: Secondary | ICD-10-CM

## 2020-02-07 DIAGNOSIS — M792 Neuralgia and neuritis, unspecified: Secondary | ICD-10-CM

## 2020-02-07 MED ORDER — OXYCODONE HCL 5 MG PO TABS: 5.0000 mg | ORAL_TABLET | Freq: Every day | ORAL | 0 refills | Status: DC | PRN

## 2020-02-07 MED ORDER — PREGABALIN 150 MG PO CAPS
150.0000 mg | ORAL_CAPSULE | Freq: Every day | ORAL | 1 refills | Status: DC
Start: 2020-02-07 — End: 2020-05-10

## 2020-02-07 MED ORDER — GABAPENTIN 300 MG PO CAPS: 600.0000 mg | ORAL_CAPSULE | Freq: Three times a day (TID) | ORAL | 1 refills | Status: DC

## 2020-02-07 NOTE — Progress Notes (Signed)
PCP is Patient, No Pcp Per Referring Provider is Sherren Mocha, MD  Chief Complaint  Patient presents with  . Routine Post Op    3 week f/u     HPI: 3 months postop emergency sternotomy for LAD perforation and tamponade with subsequent VA ECMO support transition to Impella 5.5 support.  He is having minimal cardiac symptoms and is walking daily.  His main problem is neuropathic chest pain along the sternotomy incision and beneath each breast line.  Chest x-ray shows sternal wires intact and nondisplaced and on exam the wound is well-healed.  He is taking gabapentin 3 times a day to control the neuropathic pain.  He is still having breakthrough pain at night.  He will start taking  at bedtime dose of Lyrica and transition off the oxycodone now that he is 3 months postop.  Past Medical History:  Diagnosis Date  . Alcohol abuse   . GERD (gastroesophageal reflux disease)   . Tobacco abuse     Past Surgical History:  Procedure Laterality Date  . APPLICATION OF WOUND VAC  10/25/2019   Procedure: Application Of Wound Vac;  Surgeon: [name], MD;  Location: [place];  Service: Open Heart Surgery;;  . CANNULATION FOR ECMO (EXTRACORPOREAL MEMBRANE OXYGENATION)  10/25/2019   Procedure: Cannulation For Ecmo (Extracorporeal Membrane Oxygenation) @ 1914;  Surgeon: [name], MD;  Location: [place];  Service: Open Heart Surgery;;  . CANNULATION FOR ECMO (EXTRACORPOREAL MEMBRANE OXYGENATION) N/A 10/30/2019   Procedure: DECANNULATION OF ECMO (EXTRACORPOREAL MEMBRANE OXYGENATION);  Surgeon: Prescott Gum, Collier Salina, MD;  Location: [place];  Service: Open Heart Surgery;  Laterality: N/A;  Pump standby  . CHEST EXPLORATION  10/25/2019   Procedure: Chest Exploration for repair of coronary perforation (repair of LAD);  Surgeon: Prescott Gum, Collier Salina, MD;  Location: [place];  Service: Open Heart Surgery;;  . CORONARY CTO INTERVENTION N/A 10/25/2019   Procedure: CORONARY CTO INTERVENTION;  Surgeon: Martinique, Peter M, MD;   Location: [place] [facility];  Service: Cardiovascular;  Laterality: N/A;  . CORONARY STENT INTERVENTION N/A 10/17/2019   Procedure: CORONARY STENT INTERVENTION;  Surgeon: Sherren Mocha, MD;  Location: [place] [facility];  Service: Cardiovascular;  Laterality: N/A;  . LEFT HEART CATH AND CORONARY ANGIOGRAPHY N/A 10/17/2019   Procedure: LEFT HEART CATH AND CORONARY ANGIOGRAPHY;  Surgeon: Sherren Mocha, MD;  Location: [place] [facility];  Service: Cardiovascular;  Laterality: N/A;  . MEDIASTERNOTOMY  10/25/2019   Procedure: Median Sternotomy.;  Surgeon: [name], MD;  Location: [place];  Service: Open Heart Surgery;;  . PLACEMENT OF IMPELLA LEFT VENTRICULAR ASSIST DEVICE  10/30/2019   Procedure: Placement Of Impella Left Ventricular Assist Device 5.5 USING 10MM HEMASHIELD PLATINUM GRAFT;  Surgeon: [name], MD;  Location: [place];  Service: Open Heart Surgery;;  graft placed into aorta  . REMOVAL OF IMPELLA LEFT VENTRICULAR ASSIST DEVICE N/A 11/03/2019   Procedure: REMOVAL OF IMPELLA 5.5 LEFT VENTRICULAR ASSIST DEVICE;  Surgeon: [name], MD;  Location: [place];  Service: Open Heart Surgery;  Laterality: N/A;  . STERNAL CLOSURE N/A 10/30/2019   Procedure: Sternal Closure;  Surgeon: [name], MD;  Location: [place];  Service: Open Heart Surgery;  Laterality: N/A;  . TEE WITHOUT CARDIOVERSION N/A 10/25/2019   Procedure: TRANSESOPHAGEAL ECHOCARDIOGRAM (TEE);  Surgeon: Prescott Gum, Collier Salina, MD;  Location: [place];  Service: Open Heart Surgery;  Laterality: N/A;  . TEE WITHOUT CARDIOVERSION N/A 10/30/2019   Procedure: TRANSESOPHAGEAL  ECHOCARDIOGRAM (TEE);  Surgeon: Prescott Gum, Collier Salina, MD;  Location: [place];  Service: Open Heart Surgery;  Laterality: N/A;  . TEE WITHOUT CARDIOVERSION N/A 11/03/2019   Procedure: TRANSESOPHAGEAL ECHOCARDIOGRAM (TEE);  Surgeon: Prescott Gum, Collier Salina, MD;  Location: [place];  Service: Open Heart Surgery;  Laterality: N/A;    Family History  Problem Relation Age of Onset  .  Heart attack Father     Social History Social History   Tobacco Use  . Smoking status: Former Smoker    Quit date: 10/23/2019    Years since quitting: 0.2  . Smokeless tobacco: Never Used  Substance Use Topics  . Alcohol use: Yes  . Drug use: No    Current Outpatient Medications  Medication Sig Dispense Refill  . amiodarone (PACERONE) 200 MG tablet Take 1 tablet (200 mg total) by mouth 2 (two) times daily. (Patient taking differently: Take 200 mg by mouth daily. ) 60 tablet 1  . atorvastatin (LIPITOR) 40 MG tablet Take 1 tablet (40 mg total) by mouth daily. 90 tablet 3  . carvedilol (COREG) 6.25 MG tablet Take 0.5 tablets (3.125 mg total) by mouth 2 (two) times daily with a meal. 60 tablet 3  . cetirizine (ZYRTEC) 10 MG tablet Take 10 mg by mouth daily.    . clopidogrel (PLAVIX) 75 MG tablet Take 1 tablet (75 mg total) by mouth daily. 90 tablet 3  . feeding supplement, ENSURE ENLIVE, (ENSURE ENLIVE) LIQD Take 237 mLs by mouth 3 (three) times daily between meals. 237 mL 12  . gabapentin (NEURONTIN) 300 MG capsule Take 2 capsules (600 mg total) by mouth 3 (three) times daily. 90 capsule 1  . ketotifen (ZADITOR) 0.025 % ophthalmic solution Place 1 drop into both eyes 2 (two) times daily. (Patient taking differently: Place 1 drop into both eyes daily as needed. ) 5 mL 0  . sacubitril-valsartan (ENTRESTO) 97-103 MG Take 1 tablet by mouth 2 (two) times daily. 60 tablet 1  . spironolactone (ALDACTONE) 25 MG tablet Take 1 tablet (25 mg total) by mouth daily. 30 tablet 1  . thiamine 100 MG tablet Take 1 tablet (100 mg total) by mouth daily. 30 tablet 1  . torsemide (DEMADEX) 20 MG tablet Take 1 tablet (20 mg total) by mouth every other day. 45 tablet 3  . warfarin (COUMADIN) 2.5 MG tablet Take as directed by [facility]madin Clinic 35 tablet 1  . icosapent Ethyl (VASCEPA) 1 g capsule Take 2 capsules (2 g total) by mouth 2 (two) times daily. (Patient not taking: Reported on 02/07/2020) 120 capsule 5  .  oxyCODONE (OXY IR/ROXICODONE) 5 MG immediate release tablet Take 1 tablet (5 mg total) by mouth daily as needed for severe pain. 30 tablet 0  . pregabalin (LYRICA) 150 MG capsule Take 1 capsule (150 mg total) by mouth daily. Take at bedtime 30 capsule 1   No current facility-administered medications for this visit.    No Known Allergies  Review of Systems  No shortness of breath No bleeding problems associated with the Plavix and Coumadin No edema orthopnea or fever  BP 112/77   Pulse 66   Temp 97.6 F (36.4 C) (Skin)   Resp 20   Ht 5' 8.5" (1.74 m)   Wt 177 lb (80.3 kg)   SpO2 98% Comment: RA  BMI 26.52 kg/m  Physical Exam      Exam    General- alert and comfortable.  Sternal incision well-healed.    Neck- no JVD, no cervical adenopathy  palpable, no carotid bruit   Lungs- clear without rales, wheezes   Cor- regular rate and rhythm, no murmur , gallop   Abdomen- soft, non-tender   Extremities - warm, non-tender, minimal edema   Neuro- oriented, appropriate, no focal weakness   Diagnostic Tests: None  Impression: Neuropathic chest wall pain in a young male who required 2 sternotomies and multiple cardiac procedures after developing acute tamponade and shock following LAD perforation during PCI.  Plan: The patient understands he needs to completely wean off the oxycodone.  In order to help facilitate that he will continue his 3 times daily dose of gabapentin and take a bedtime dose of Lyrica.  He currently has an excellent functional status with minimal cardiac symptoms.  His main problem is chest wall hypersensitivity and neuropathic pain.  Return in 4 weeks for review of his neuropathic pain.  The patient understands he can only take the oxycodone once a day and that no more narcotics will be prescribed.   Len Childs, MD [facility] [phone]

## 2020-02-20 ENCOUNTER — Encounter (HOSPITAL_COMMUNITY): Payer: Self-pay | Admitting: Cardiology

## 2020-02-20 ENCOUNTER — Other Ambulatory Visit: Payer: Self-pay

## 2020-02-20 ENCOUNTER — Ambulatory Visit (HOSPITAL_BASED_OUTPATIENT_CLINIC_OR_DEPARTMENT_OTHER)
Admission: RE | Admit: 2020-02-20 | Discharge: 2020-02-20 | Disposition: A | Discharge status: Home / Self Care | Payer: BLUE CROSS/BLUE SHIELD | Source: Ambulatory Visit | Attending: Cardiology | Admitting: Cardiology

## 2020-02-20 ENCOUNTER — Ambulatory Visit (HOSPITAL_COMMUNITY)
Admission: RE | Admit: 2020-02-20 | Discharge: 2020-02-20 | Disposition: A | Discharge status: Home / Self Care | Payer: BLUE CROSS/BLUE SHIELD | Source: Ambulatory Visit | Attending: Cardiology | Admitting: Cardiology

## 2020-02-20 VITALS — BP 118/60 | HR 73 | Ht 68.0 in | Wt 183.0 lb

## 2020-02-20 DIAGNOSIS — I2510 Atherosclerotic heart disease of native coronary artery without angina pectoris: Secondary | ICD-10-CM | POA: Diagnosis not present

## 2020-02-20 DIAGNOSIS — I252 Old myocardial infarction: Secondary | ICD-10-CM | POA: Diagnosis not present

## 2020-02-20 DIAGNOSIS — Z7901 Long term (current) use of anticoagulants: Secondary | ICD-10-CM | POA: Diagnosis not present

## 2020-02-20 DIAGNOSIS — Z87891 Personal history of nicotine dependence: Secondary | ICD-10-CM | POA: Diagnosis not present

## 2020-02-20 DIAGNOSIS — I5022 Chronic systolic (congestive) heart failure: Secondary | ICD-10-CM

## 2020-02-20 DIAGNOSIS — I513 Intracardiac thrombosis, not elsewhere classified: Secondary | ICD-10-CM | POA: Diagnosis not present

## 2020-02-20 DIAGNOSIS — Z79899 Other long term (current) drug therapy: Secondary | ICD-10-CM | POA: Diagnosis not present

## 2020-02-20 DIAGNOSIS — Z7982 Long term (current) use of aspirin: Secondary | ICD-10-CM | POA: Diagnosis not present

## 2020-02-20 DIAGNOSIS — I255 Ischemic cardiomyopathy: Secondary | ICD-10-CM | POA: Insufficient documentation

## 2020-02-20 DIAGNOSIS — E781 Pure hyperglyceridemia: Secondary | ICD-10-CM | POA: Insufficient documentation

## 2020-02-20 DIAGNOSIS — R0789 Other chest pain: Secondary | ICD-10-CM | POA: Diagnosis not present

## 2020-02-20 DIAGNOSIS — I10 Essential (primary) hypertension: Secondary | ICD-10-CM | POA: Diagnosis not present

## 2020-02-20 DIAGNOSIS — I4510 Unspecified right bundle-branch block: Secondary | ICD-10-CM | POA: Diagnosis not present

## 2020-02-20 DIAGNOSIS — K219 Gastro-esophageal reflux disease without esophagitis: Secondary | ICD-10-CM | POA: Insufficient documentation

## 2020-02-20 DIAGNOSIS — I509 Heart failure, unspecified: Secondary | ICD-10-CM | POA: Diagnosis not present

## 2020-02-20 DIAGNOSIS — Z8249 Family history of ischemic heart disease and other diseases of the circulatory system: Secondary | ICD-10-CM | POA: Diagnosis not present

## 2020-02-20 DIAGNOSIS — Z8674 Personal history of sudden cardiac arrest: Secondary | ICD-10-CM | POA: Diagnosis not present

## 2020-02-20 DIAGNOSIS — Z95811 Presence of heart assist device: Secondary | ICD-10-CM | POA: Diagnosis not present

## 2020-02-20 LAB — COMPREHENSIVE METABOLIC PANEL
ALT: 57 U/L — ABNORMAL HIGH (ref 0–44)
AST: 37 U/L (ref 15–41)
Albumin: 4.3 g/dL (ref 3.5–5.0)
Alkaline Phosphatase: 89 U/L (ref 38–126)
Anion gap: 10 (ref 5–15)
BUN: 11 mg/dL (ref 6–20)
CO2: 28 mmol/L (ref 22–32)
Calcium: 9.6 mg/dL (ref 8.9–10.3)
Chloride: 104 mmol/L (ref 98–111)
Creatinine, Ser: 1.14 mg/dL (ref 0.61–1.24)
GFR calc Af Amer: >60 mL/min (ref >60)
GFR calc non Af Amer: >60 mL/min (ref >60)
Glucose, Bld: 89 mg/dL (ref 70–99)
Potassium: 4.3 mmol/L (ref 3.5–5.1)
Sodium: 142 mmol/L (ref 135–145)
Total Bilirubin: 0.5 mg/dL (ref 0.3–1.2)
Total Protein: 7.1 g/dL (ref 6.5–8.1)

## 2020-02-20 LAB — ECHOCARDIOGRAM COMPLETE
Area-P 1/2: 3.42 cm2
S' Lateral: 3.1 cm

## 2020-02-20 LAB — CBC
HCT: 39.5 % (ref 39.0–52.0)
Hemoglobin: 12.6 g/dL — ABNORMAL LOW (ref 13.0–17.0)
MCH: 29 pg (ref 26.0–34.0)
MCHC: 31.9 g/dL (ref 30.0–36.0)
MCV: 91 fL (ref 80.0–100.0)
Platelets: 279 10*3/uL (ref 150–400)
RBC: 4.34 MIL/uL (ref 4.22–5.81)
RDW: 14.1 % (ref 11.5–15.5)
WBC: 6.6 10*3/uL (ref 4.0–10.5)
nRBC: 0 % (ref 0.0–0.2)

## 2020-02-20 LAB — TSH: TSH: 1.517 u[IU]/mL (ref 0.350–4.500)

## 2020-02-20 MED ORDER — ASPIRIN EC 81 MG PO TBEC
81.0000 mg | DELAYED_RELEASE_TABLET | Freq: Every day | ORAL | 3 refills | Status: DC
Start: 2020-02-20 — End: 2021-02-21

## 2020-02-20 MED ORDER — CARVEDILOL 6.25 MG PO TABS: 6.2500 mg | ORAL_TABLET | Freq: Two times a day (BID) | ORAL | 3 refills | Status: DC

## 2020-02-20 NOTE — Progress Notes (Signed)
  Echocardiogram 2D Echocardiogram has been performed.  [name] 02/20/2020, 3:03 PM

## 2020-02-20 NOTE — Patient Instructions (Signed)
Stop Coumadin  Stop Amiodarone  Start Aspirin 81 mg Daily  Labs done today, your results will be available in MyChart, we will contact you for abnormal readings.  Your physician recommends that you schedule a follow-up appointment in: 3 months  If you have any questions or concerns before your next appointment please send [place] a message through [place] or call our office at [phone].    TO LEAVE A MESSAGE FOR THE NURSE SELECT OPTION 2, PLEASE LEAVE A MESSAGE INCLUDING: . YOUR NAME . DATE OF BIRTH . CALL BACK NUMBER . REASON FOR CALL**this is important as we prioritize the call backs  [place] AS LONG AS YOU CALL BEFORE 4:00 PM  At the [facility], you and your health needs are our priority. As part of our continuing mission to provide you with exceptional heart care, we have created designated Provider Care Teams. These Care Teams include your primary Cardiologist (physician) and Advanced Practice Providers (APPs- Physician Assistants and Nurse Practitioners) who all work together to provide you with the care you need, when you need it.   You may see any of the following providers on your designated Care Team at your next follow up: Marland Kitchen Dr Glori Bickers . Dr Loralie Champagne . Darrick Grinder, NP . Lyda Jester, PA . Audry Riles, PharmD   Please be sure to bring in all your medications bottles to every appointment.

## 2020-02-21 NOTE — Progress Notes (Signed)
PCP: Patient, No Pcp Per  [facility] Cardiology: Dr. Aundra Dubin  49 y.o. with history of heavy smoking, heavy ETOH, and strong FH of CAD was initially admitted to [facility] on 10/17/19 with chest pain and NSTEMI.  LHC showed 95% distal RCA (culprit) with chronically occluded proximal LAD and OM1 with collaterals.  He had DES to distal RCA initially.  Echo showed EF 35-40% with inferior wall motion abnormality and normal RV.  He was discharged home with plan for CTO intervention on LAD.  He came for elective CTO procedure in 6/21, however he had perforation of the LAD with balloon inflation. A covered stent was placed.  Echo showed EF 45% with apical hypokinesis, moderate pericardial effusion and evidence for tamponade.  He was then taken to the OR for LAD repair and drainage of pericardial effusion.   In the OR, he had a VF arrest with about 15 minutes of CPR.  He was put on cardiopulmonary bypass and the tear in the LAD was repaired.  The LAD was not graftable.  TEE in the OR showed severe biventricular failure, suspect stunning post-VF arrest.  He was unable to come off bypass.  Therefore, he was cannulated for ECMO with venous drainage from RA and arterial return to ascending aorta.  After 4 days, ECMO was decannulated and Impella 5.5 was placed.  4 days after that, Impella was removed. He was eventually weaned off pressors/inotropes and discharged home.  Echo in 11/03/19 showed EF up to 45-50%.    Echo was done today and reviewed, EF 55% with mild anteroseptal hypokinesis and normal RV.   Patient returns for followup of CAD and CHF.  He has been doing very well recently.  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  No exertional dyspnea.  No orthopnea/PND.  No chest pain.  No lightheadedness.  He is not smoking.     ECG (personally reviewed): NSR, RBBB  Labs (7/21): K 4.1, creatinine 1.08 Labs (8/21): K 3.9, creatinine 1.04, LDL 67, HDL 37, TGs 248  PMH: 1.  ETOH abuse 2. Smoking 3. GERD 4. CAD: NSTEMI in 5/21 with 95% distal RCA (culprit) and CTO LAD and OM1.  He had DES to distal RCA.  - He returned in 6/21 for CTO procedure to LAD, complicated by perforation of the LAD.  He was taken to the OR emergently for repair of the perforation.  Unable to graft the LAD.  5. VF arrest: 6/21 at time of LAD perforation/tamponade. 6. Ischemic cardiomyopathy:  - Echo (5/21): EF 35-40%. - Echo (6/21, post arrest): EF < 20%, moderately reduced RV function.  - ECMO in 6/21 post-LAD perforation and arrest.  - Echo (11/03/19): EF 45-50%.  - Echo (9/21): EF 55%, mild anteroseptal hypokinesis, normal RV.  7. Right atrial thrombus: Noted post-ECMO.  Warfarin.   Social History   Socioeconomic History  . Marital status: Single    Spouse name: Not on file  . Number of children: Not on file  . Years of education: Not on file  . Highest education level: Not on file  Occupational History  . Not on file  Tobacco Use  . Smoking status: Former Smoker    Quit date: 10/23/2019    Years since quitting: 0.3  . Smokeless tobacco: Never Used  Substance and Sexual Activity  . Alcohol use: Yes  . Drug use: No  . Sexual activity: Not on file  Other Topics Concern  .  Not on file  Social History Narrative  . Not on file   Social Determinants of Health   Financial Resource Strain:   . Difficulty of Paying Living Expenses: Not on file  Food Insecurity:   . Worried About Charity fundraiser in the Last Year: Not on file  . Ran Out of Food in the Last Year: Not on file  Transportation Needs:   . Lack of Transportation (Medical): Not on file  . Lack of Transportation (Non-Medical): Not on file  Physical Activity:   . Days of Exercise per Week: Not on file  . Minutes of Exercise per Session: Not on file  Stress:   . Feeling of Stress : Not on file  Social Connections:   . Frequency of Communication with Friends and Family: Not on file  . Frequency of Social  Gatherings with Friends and Family: Not on file  . Attends Religious Services: Not on file  . Active Member of Clubs or Organizations: Not on file  . Attends Archivist Meetings: Not on file  . Marital Status: Not on file  Intimate Partner Violence:   . Fear of Current or Ex-Partner: Not on file  . Emotionally Abused: Not on file  . Physically Abused: Not on file  . Sexually Abused: Not on file   Family History  Problem Relation Age of Onset  . Heart attack Father    ROS: All systems reviewed and negative except as per HPI.   Current Outpatient Medications  Medication Sig Dispense Refill  . atorvastatin (LIPITOR) 40 MG tablet Take 1 tablet (40 mg total) by mouth daily. 90 tablet 3  . carvedilol (COREG) 6.25 MG tablet Take 1 tablet (6.25 mg total) by mouth 2 (two) times daily with a meal. 60 tablet 3  . cetirizine (ZYRTEC) 10 MG tablet Take 10 mg by mouth daily.    . clopidogrel (PLAVIX) 75 MG tablet Take 1 tablet (75 mg total) by mouth daily. 90 tablet 3  . gabapentin (NEURONTIN) 300 MG capsule Take 2 capsules (600 mg total) by mouth 3 (three) times daily. 90 capsule 1  . icosapent Ethyl (VASCEPA) 1 g capsule Take 2 capsules (2 g total) by mouth 2 (two) times daily. 120 capsule 5  . oxyCODONE (OXY IR/ROXICODONE) 5 MG immediate release tablet Take 1 tablet (5 mg total) by mouth daily as needed for severe pain. 30 tablet 0  . pregabalin (LYRICA) 150 MG capsule Take 1 capsule (150 mg total) by mouth daily. Take at bedtime 30 capsule 1  . sacubitril-valsartan (ENTRESTO) 97-103 MG Take 1 tablet by mouth 2 (two) times daily. 60 tablet 1  . spironolactone (ALDACTONE) 25 MG tablet Take 1 tablet (25 mg total) by mouth daily. 30 tablet 1  . thiamine 100 MG tablet Take 1 tablet (100 mg total) by mouth daily. 30 tablet 1  . torsemide (DEMADEX) 20 MG tablet Take 1 tablet (20 mg total) by mouth every other day. 45 tablet 3  . aspirin EC 81 MG tablet Take 1 tablet (81 mg total) by mouth  daily. Swallow whole. 90 tablet 3  . ketotifen (ZADITOR) 0.025 % ophthalmic solution Place 1 drop into both eyes 2 (two) times daily. (Patient not taking: Reported on 02/20/2020) 5 mL 0   No current facility-administered medications for this encounter.   BP 118/60 (BP Location: Left Arm, Patient Position: Sitting, Cuff Size: Normal)   Pulse 73   Ht 5' 8"  (1.727 m)   Wt 83 kg (  183 lb)   SpO2 98%   BMI 27.83 kg/m  General: NAD Neck: No JVD, no thyromegaly or thyroid nodule.  Lungs: Clear to auscultation bilaterally with normal respiratory effort. CV: Nondisplaced PMI.  Heart regular S1/S2, no S3/S4, no murmur.  No peripheral edema.  No carotid bruit.  Normal pedal pulses.  Abdomen: Soft, nontender, no hepatosplenomegaly, no distention.  Skin: Intact without lesions or rashes.  Neurologic: Alert and oriented x 3.  Psych: Normal affect. Extremities: No clubbing or cyanosis.  HEENT: Normal.   Assessment/Plan: 1. CAD: NSTEMI in 5/21 with DES to distal RCA.  He was left with occluded OM1 and LAD. CTO procedure was attempted to open LAD, but complicated by LAD perforation and tamponade/cardiac arrest requiring trip to OR and VA ECMO. The LAD was not graftable, he has no perfusion to the LAD territory except by collaterals.  No chest pain.  - With recovery of EF and no thrombus on echo today, I will stop warfarin and he will start ASA 81 mg daily.  - Continue atorvastatin.  2. Cardiac arrest: In OR after LAD perforation/tamponade.  - With recovery of EF, he will stop amiodarone.  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  He is on warfarin currently.  - Stop warfarin with recovery of EF and no thrombus on echo today.  4. Smoking: He remains abstinent.  5. ETOH abuse: He remains abstinent.  6. Chronic systolic CHF: Ischemic cardiomyopathy.  Echo after cardiac arrest in 6/21 showed EF < 20% with moderate RV dysfunction.  Echo in 6/21 after decannulation of ECMO and  removal of Impella 5.5 showed EF up to 45-50%.  Echo today with EF up to 55%.  On exam, he is not volume overloaded.  NYHA class I. - Continue Coreg 6.25 mg bid.   - Continue Entresto 97/103 bid and spironolactone 25 mg daily. - He should be able to stop torsemide.  - EF recovered, no ICD needed.  7. Hypertriglyceridemia: Continue Vascepa.   Followup 3 months.   Loralie Champagne 02/21/2020

## 2020-02-22 ENCOUNTER — Encounter (HOSPITAL_COMMUNITY): Payer: Self-pay

## 2020-02-22 NOTE — Progress Notes (Signed)
Cardiac Rehab Note:  Clinical review of pt follow up appt on 02/20/20 with Dr. Aundra Dubin - cardiologist office note. Pt is making the expected progress in recovery.  Pt appropriate for scheduling for on site cardiac rehab and/or enrollment in Virtual Cardiac Rehab.  Pt Covid Risk Score is 4.  Will forward to staff for follow up.  Mitsuru Dault E. Rollene Rotunda RN, BSN [facility]  Cardiac and Pulmonary Rehabilitation Phone: [phone] Fax: [phone]

## 2020-02-27 ENCOUNTER — Telehealth (HOSPITAL_COMMUNITY): Payer: Self-pay

## 2020-02-27 NOTE — Telephone Encounter (Signed)
Called patient to see if he was interested in participating in the Cardiac Rehab Program. Patient stated not at this time, he has returned to work.  Closed referral

## 2020-03-01 ENCOUNTER — Telehealth: Payer: Self-pay | Admitting: *Deleted

## 2020-03-01 NOTE — Telephone Encounter (Signed)
Mr. William Crawford called requesting a refill of his oxycodone. Per Dr. Prescott Gum pt is not to receive a refill at this time. Pt understands receipt.

## 2020-03-06 ENCOUNTER — Ambulatory Visit: Payer: BLUE CROSS/BLUE SHIELD | Admitting: Cardiothoracic Surgery

## 2020-03-06 ENCOUNTER — Other Ambulatory Visit: Payer: Self-pay

## 2020-03-06 ENCOUNTER — Encounter: Payer: Self-pay | Admitting: Cardiothoracic Surgery

## 2020-03-06 VITALS — BP 130/70 | HR 76 | Temp 98.2°F | Resp 18 | Ht 68.0 in | Wt 190.0 lb

## 2020-03-06 DIAGNOSIS — M792 Neuralgia and neuritis, unspecified: Secondary | ICD-10-CM

## 2020-03-06 MED ORDER — OXYCODONE HCL 5 MG PO TABS: 10.0000 mg | ORAL_TABLET | Freq: Every day | ORAL | 0 refills | Status: DC | PRN

## 2020-03-06 NOTE — Progress Notes (Signed)
PCP is Patient, No Pcp Per Referring Provider is Sherren Mocha, MD  Chief Complaint  Patient presents with  . Follow-up    f/u of neuropathic pain    HPI: Patient returns for surgical follow-up now 4 months after emergency sternotomy for LAD perforation during attempted PCI resulting in tamponade cardiac arrest, cardiopulmonary bypass, VA ECMO support for several days transition to Impella 5.5 support.  The patient is now stable and an echocardiogram performed 2 weeks ago shows normal LV function.  He is followed in the advanced heart failure clinic by Dr. Aundra Dubin.  He denies symptoms of heart failure or angina.  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  An attempt at weaning him off narcotics has been unsuccessful despite continuing his gabapentin and Lyrica.  He has tried tramadol without any benefit.  Since he is now 4 months postop he will be referred to a pain clinic for treatment of his chronic pain.  On exam the sternal incision is well-healed.  It is exquisite to touch with tenderness and neuropathic pain.  It is well-healed and sternal wires intact on x-ray images.   Past Medical History:  Diagnosis Date  . Alcohol abuse   . GERD (gastroesophageal reflux disease)   . Tobacco abuse     Past Surgical History:  Procedure Laterality Date  . APPLICATION OF WOUND VAC  10/25/2019   Procedure: Application Of Wound Vac;  Surgeon: [name], MD;  Location: [place];  Service: Open Heart Surgery;;  . CANNULATION FOR ECMO (EXTRACORPOREAL MEMBRANE OXYGENATION)  10/25/2019   Procedure: Cannulation For Ecmo (Extracorporeal Membrane Oxygenation) @ 6767;  Surgeon: [name], MD;  Location: [place];  Service: Open Heart Surgery;;  . CANNULATION FOR ECMO (EXTRACORPOREAL MEMBRANE OXYGENATION) N/A 10/30/2019   Procedure: DECANNULATION OF ECMO (EXTRACORPOREAL MEMBRANE OXYGENATION);  Surgeon: Prescott Gum, Collier Salina, MD;  Location: [place];  Service: Open Heart Surgery;   Laterality: N/A;  Pump standby  . CHEST EXPLORATION  10/25/2019   Procedure: Chest Exploration for repair of coronary perforation (repair of LAD);  Surgeon: Prescott Gum, Collier Salina, MD;  Location: [place];  Service: Open Heart Surgery;;  . CORONARY CTO INTERVENTION N/A 10/25/2019   Procedure: CORONARY CTO INTERVENTION;  Surgeon: Martinique, Brenley Priore M, MD;  Location: [place] CV [facility];  Service: Cardiovascular;  Laterality: N/A;  . CORONARY STENT INTERVENTION N/A 10/17/2019   Procedure: CORONARY STENT INTERVENTION;  Surgeon: Sherren Mocha, MD;  Location: [place] CV [facility];  Service: Cardiovascular;  Laterality: N/A;  . LEFT HEART CATH AND CORONARY ANGIOGRAPHY N/A 10/17/2019   Procedure: LEFT HEART CATH AND CORONARY ANGIOGRAPHY;  Surgeon: Sherren Mocha, MD;  Location: [place] CV [facility];  Service: Cardiovascular;  Laterality: N/A;  . MEDIASTERNOTOMY  10/25/2019   Procedure: Median Sternotomy.;  Surgeon: [name], MD;  Location: [place];  Service: Open Heart Surgery;;  . PLACEMENT OF IMPELLA LEFT VENTRICULAR ASSIST DEVICE  10/30/2019   Procedure: Placement Of Impella Left Ventricular Assist Device 5.5 USING 10MM HEMASHIELD PLATINUM GRAFT;  Surgeon: [name], MD;  Location: [place];  Service: Open Heart Surgery;;  graft placed into aorta  . REMOVAL OF IMPELLA LEFT VENTRICULAR ASSIST DEVICE N/A 11/03/2019   Procedure: REMOVAL OF IMPELLA 5.5 LEFT VENTRICULAR ASSIST DEVICE;  Surgeon: [name], MD;  Location: [place];  Service: Open Heart Surgery;  Laterality: N/A;  . STERNAL CLOSURE N/A 10/30/2019   Procedure: Sternal Closure;  Surgeon: [name], MD;  Location: [facility]  OR;  Service: Open Heart Surgery;  Laterality: N/A;  . TEE WITHOUT CARDIOVERSION N/A 10/25/2019   Procedure: TRANSESOPHAGEAL ECHOCARDIOGRAM (TEE);  Surgeon: Prescott Gum, Collier Salina, MD;  Location: [place];  Service: Open Heart Surgery;  Laterality: N/A;  . TEE WITHOUT CARDIOVERSION N/A 10/30/2019   Procedure: TRANSESOPHAGEAL ECHOCARDIOGRAM (TEE);   Surgeon: Prescott Gum, Collier Salina, MD;  Location: [place];  Service: Open Heart Surgery;  Laterality: N/A;  . TEE WITHOUT CARDIOVERSION N/A 11/03/2019   Procedure: TRANSESOPHAGEAL ECHOCARDIOGRAM (TEE);  Surgeon: Prescott Gum, Collier Salina, MD;  Location: [place];  Service: Open Heart Surgery;  Laterality: N/A;    Family History  Problem Relation Age of Onset  . Heart attack Father     Social History Social History   Tobacco Use  . Smoking status: Former Smoker    Quit date: 10/23/2019    Years since quitting: 0.3  . Smokeless tobacco: Never Used  Substance Use Topics  . Alcohol use: Yes  . Drug use: No    Current Outpatient Medications  Medication Sig Dispense Refill  . aspirin EC 81 MG tablet Take 1 tablet (81 mg total) by mouth daily. Swallow whole. 90 tablet 3  . atorvastatin (LIPITOR) 40 MG tablet Take 1 tablet (40 mg total) by mouth daily. 90 tablet 3  . carvedilol (COREG) 6.25 MG tablet Take 1 tablet (6.25 mg total) by mouth 2 (two) times daily with a meal. 60 tablet 3  . cetirizine (ZYRTEC) 10 MG tablet Take 10 mg by mouth daily.    . clopidogrel (PLAVIX) 75 MG tablet Take 1 tablet (75 mg total) by mouth daily. 90 tablet 3  . gabapentin (NEURONTIN) 300 MG capsule Take 2 capsules (600 mg total) by mouth 3 (three) times daily. 90 capsule 1  . icosapent Ethyl (VASCEPA) 1 g capsule Take 2 capsules (2 g total) by mouth 2 (two) times daily. 120 capsule 5  . ketotifen (ZADITOR) 0.025 % ophthalmic solution Place 1 drop into both eyes 2 (two) times daily. 5 mL 0  . oxyCODONE (OXY IR/ROXICODONE) 5 MG immediate release tablet Take 2 tablets (10 mg total) by mouth daily as needed for severe pain. 30 tablet 0  . pregabalin (LYRICA) 150 MG capsule Take 1 capsule (150 mg total) by mouth daily. Take at bedtime 30 capsule 1  . sacubitril-valsartan (ENTRESTO) 97-103 MG Take 1 tablet by mouth 2 (two) times daily. 60 tablet 1  . spironolactone (ALDACTONE) 25 MG tablet Take 1 tablet (25 mg total) by mouth daily. 30  tablet 1  . thiamine 100 MG tablet Take 1 tablet (100 mg total) by mouth daily. 30 tablet 1  . torsemide (DEMADEX) 20 MG tablet Take 1 tablet (20 mg total) by mouth every other day. 45 tablet 3   No current facility-administered medications for this visit.    No Known Allergies  Review of Systems  Patient is not smoking or using alcohol.  BP 130/70 (BP Location: Right Arm, Patient Position: Sitting)   Pulse 76   Temp 98.2 F (36.8 C)   Resp 18   Ht 5' 8"  (1.727 m)   Wt 190 lb (86.2 kg)   SpO2 98% Comment: RA with mask on  BMI 28.89 kg/m  Physical Exam      Exam    General- alert and comfortable.  Sternal incision well-healed but tender.    Neck- no JVD, no cervical adenopathy palpable, no carotid bruit   Lungs- clear without rales, wheezes   Cor- regular rate and rhythm,  no murmur , gallop   Abdomen- soft, non-tender   Extremities - warm, non-tender, minimal edema   Neuro- oriented, appropriate, no focal weakness   Diagnostic Tests: None  Impression: Patient has recovered normal LV function after emergency sternotomy, cardiopulmonary bypass, ECMO, Impella LVAD.  He has residual persistent neuropathic pain.  Plan: Cardiac disease will be followed by his cardiologist in the advanced heart failure clinic. Treatment of his neuropathic chronic pain will be referred to a pain clinic.  Len Childs, MD [facility] [phone]

## 2020-03-11 DIAGNOSIS — M25512 Pain in left shoulder: Secondary | ICD-10-CM | POA: Diagnosis not present

## 2020-03-11 DIAGNOSIS — L989 Disorder of the skin and subcutaneous tissue, unspecified: Secondary | ICD-10-CM | POA: Diagnosis not present

## 2020-03-11 DIAGNOSIS — G8929 Other chronic pain: Secondary | ICD-10-CM | POA: Diagnosis not present

## 2020-03-11 DIAGNOSIS — L723 Sebaceous cyst: Secondary | ICD-10-CM | POA: Diagnosis not present

## 2020-03-14 ENCOUNTER — Other Ambulatory Visit (HOSPITAL_COMMUNITY): Payer: Self-pay | Admitting: *Deleted

## 2020-03-14 MED ORDER — SPIRONOLACTONE 25 MG PO TABS: 25.0000 mg | ORAL_TABLET | Freq: Every day | ORAL | 1 refills | Status: DC

## 2020-03-14 MED ORDER — SACUBITRIL-VALSARTAN 97-103 MG PO TABS: 1.0000 | ORAL_TABLET | Freq: Two times a day (BID) | ORAL | 1 refills | Status: DC

## 2020-03-18 ENCOUNTER — Observation Stay
Admission: EM | Admit: 2020-03-18 | Discharge: 2020-03-20 | Disposition: A | Discharge status: Home / Self Care | Payer: BLUE CROSS/BLUE SHIELD | Attending: Hospitalist | Admitting: Hospitalist

## 2020-03-18 ENCOUNTER — Other Ambulatory Visit: Payer: Self-pay

## 2020-03-18 ENCOUNTER — Encounter: Payer: Self-pay | Admitting: Emergency Medicine

## 2020-03-18 ENCOUNTER — Emergency Department: Payer: BLUE CROSS/BLUE SHIELD

## 2020-03-18 DIAGNOSIS — I513 Intracardiac thrombosis, not elsewhere classified: Secondary | ICD-10-CM | POA: Diagnosis present

## 2020-03-18 DIAGNOSIS — R079 Chest pain, unspecified: Secondary | ICD-10-CM | POA: Diagnosis not present

## 2020-03-18 DIAGNOSIS — I712 Thoracic aortic aneurysm, without rupture, unspecified: Secondary | ICD-10-CM

## 2020-03-18 DIAGNOSIS — Z20822 Contact with and (suspected) exposure to covid-19: Secondary | ICD-10-CM | POA: Insufficient documentation

## 2020-03-18 DIAGNOSIS — I2699 Other pulmonary embolism without acute cor pulmonale: Secondary | ICD-10-CM | POA: Diagnosis not present

## 2020-03-18 DIAGNOSIS — R55 Syncope and collapse: Secondary | ICD-10-CM | POA: Diagnosis not present

## 2020-03-18 DIAGNOSIS — Z720 Tobacco use: Secondary | ICD-10-CM | POA: Diagnosis present

## 2020-03-18 DIAGNOSIS — Z87891 Personal history of nicotine dependence: Secondary | ICD-10-CM | POA: Diagnosis not present

## 2020-03-18 DIAGNOSIS — Z7901 Long term (current) use of anticoagulants: Secondary | ICD-10-CM | POA: Insufficient documentation

## 2020-03-18 DIAGNOSIS — Z7982 Long term (current) use of aspirin: Secondary | ICD-10-CM | POA: Diagnosis not present

## 2020-03-18 DIAGNOSIS — J9811 Atelectasis: Secondary | ICD-10-CM | POA: Diagnosis not present

## 2020-03-18 DIAGNOSIS — Z79899 Other long term (current) drug therapy: Secondary | ICD-10-CM | POA: Insufficient documentation

## 2020-03-18 DIAGNOSIS — I2510 Atherosclerotic heart disease of native coronary artery without angina pectoris: Secondary | ICD-10-CM | POA: Diagnosis present

## 2020-03-18 DIAGNOSIS — I255 Ischemic cardiomyopathy: Secondary | ICD-10-CM | POA: Diagnosis present

## 2020-03-18 DIAGNOSIS — E785 Hyperlipidemia, unspecified: Secondary | ICD-10-CM | POA: Diagnosis present

## 2020-03-18 DIAGNOSIS — I10 Essential (primary) hypertension: Secondary | ICD-10-CM | POA: Diagnosis not present

## 2020-03-18 DIAGNOSIS — F1010 Alcohol abuse, uncomplicated: Secondary | ICD-10-CM | POA: Diagnosis present

## 2020-03-18 DIAGNOSIS — Z9281 Personal history of extracorporeal membrane oxygenation (ECMO): Secondary | ICD-10-CM

## 2020-03-18 HISTORY — DX: Atherosclerotic heart disease of native coronary artery without angina pectoris: I25.10

## 2020-03-18 HISTORY — DX: Unspecified systolic (congestive) heart failure: I50.20

## 2020-03-18 HISTORY — DX: Pure hyperglyceridemia: E78.1

## 2020-03-18 HISTORY — DX: Cardiac arrest, cause unspecified: I46.9

## 2020-03-18 HISTORY — DX: Cardiogenic shock: R57.0

## 2020-03-18 HISTORY — DX: Ischemic cardiomyopathy: I25.5

## 2020-03-18 HISTORY — DX: Intracardiac thrombosis, not elsewhere classified: I51.3

## 2020-03-18 LAB — CBC
HCT: 34.9 % — ABNORMAL LOW (ref 39.0–52.0)
HCT: 35.3 % — ABNORMAL LOW (ref 39.0–52.0)
Hemoglobin: 11.8 g/dL — ABNORMAL LOW (ref 13.0–17.0)
Hemoglobin: 11.7 g/dL — ABNORMAL LOW (ref 13.0–17.0)
MCH: 30.3 pg (ref 26.0–34.0)
MCH: 30.1 pg (ref 26.0–34.0)
MCHC: 33.8 g/dL (ref 30.0–36.0)
MCHC: 33.1 g/dL (ref 30.0–36.0)
MCV: 89.5 fL (ref 80.0–100.0)
MCV: 90.7 fL (ref 80.0–100.0)
Platelets: 238 10*3/uL (ref 150–400)
Platelets: 233 10*3/uL (ref 150–400)
RBC: 3.9 MIL/uL — ABNORMAL LOW (ref 4.22–5.81)
RBC: 3.89 MIL/uL — ABNORMAL LOW (ref 4.22–5.81)
RDW: 13.3 % (ref 11.5–15.5)
RDW: 13.2 % (ref 11.5–15.5)
WBC: 6.4 10*3/uL (ref 4.0–10.5)
WBC: 6.2 10*3/uL (ref 4.0–10.5)
nRBC: 0 % (ref 0.0–0.2)
nRBC: 0 % (ref 0.0–0.2)

## 2020-03-18 LAB — TROPONIN I (HIGH SENSITIVITY)
Troponin I (High Sensitivity): 18 ng/L — ABNORMAL HIGH (ref <18)
Troponin I (High Sensitivity): 26 ng/L — ABNORMAL HIGH (ref <18)

## 2020-03-18 LAB — COMPREHENSIVE METABOLIC PANEL
ALT: 23 U/L (ref 0–44)
AST: 20 U/L (ref 15–41)
Albumin: 4.5 g/dL (ref 3.5–5.0)
Alkaline Phosphatase: 76 U/L (ref 38–126)
Anion gap: 10 (ref 5–15)
BUN: 14 mg/dL (ref 6–20)
CO2: 25 mmol/L (ref 22–32)
Calcium: 9.1 mg/dL (ref 8.9–10.3)
Chloride: 100 mmol/L (ref 98–111)
Creatinine, Ser: 1.29 mg/dL — ABNORMAL HIGH (ref 0.61–1.24)
GFR, Estimated: >60 mL/min (ref >60)
Glucose, Bld: 123 mg/dL — ABNORMAL HIGH (ref 70–99)
Potassium: 3.7 mmol/L (ref 3.5–5.1)
Sodium: 135 mmol/L (ref 135–145)
Total Bilirubin: 0.7 mg/dL (ref 0.3–1.2)
Total Protein: 7.3 g/dL (ref 6.5–8.1)

## 2020-03-18 LAB — RESPIRATORY PANEL BY RT PCR (FLU A&B, COVID)
Influenza A by PCR: NEGATIVE
Influenza B by PCR: NEGATIVE
SARS Coronavirus 2 by RT PCR: NEGATIVE

## 2020-03-18 LAB — PROTIME-INR
INR: 1 (ref 0.8–1.2)
Prothrombin Time: 12.3 seconds (ref 11.4–15.2)

## 2020-03-18 MED ORDER — ENOXAPARIN SODIUM 40 MG/0.4ML ~~LOC~~ SOLN
40.0000 mg | Freq: Every day | SUBCUTANEOUS | Status: DC
  Administered 2020-03-18 – 2020-03-19 (×2): 40 mg via SUBCUTANEOUS
  Filled 2020-03-18 (×2): qty 0.4

## 2020-03-18 MED ORDER — ACETAMINOPHEN 325 MG PO TABS
650.0000 mg | ORAL_TABLET | Freq: Four times a day (QID) | ORAL | Status: DC | PRN
  Administered 2020-03-19 (×2): 650 mg via ORAL
  Filled 2020-03-18 (×2): qty 2

## 2020-03-18 MED ORDER — ASPIRIN EC 81 MG PO TBEC
81.0000 mg | DELAYED_RELEASE_TABLET | Freq: Every day | ORAL | Status: DC
  Administered 2020-03-19 – 2020-03-20 (×2): 81 mg via ORAL
  Filled 2020-03-18 (×2): qty 1

## 2020-03-18 MED ORDER — LORATADINE 10 MG PO TABS
10.0000 mg | ORAL_TABLET | Freq: Every day | ORAL | Status: DC
  Administered 2020-03-19 – 2020-03-20 (×2): 10 mg via ORAL
  Filled 2020-03-18 (×2): qty 1

## 2020-03-18 MED ORDER — SODIUM CHLORIDE 0.9% FLUSH
3.0000 mL | Freq: Two times a day (BID) | INTRAVENOUS | Status: DC
  Administered 2020-03-18 – 2020-03-20 (×4): 3 mL via INTRAVENOUS

## 2020-03-18 MED ORDER — KETOTIFEN FUMARATE 0.025 % OP SOLN
1.0000 [drp] | Freq: Two times a day (BID) | OPHTHALMIC | Status: DC
  Administered 2020-03-18 – 2020-03-20 (×4): 1 [drp] via OPHTHALMIC
  Filled 2020-03-18: qty 5

## 2020-03-18 MED ORDER — IOHEXOL 350 MG/ML SOLN
75.0000 mL | Freq: Once | INTRAVENOUS | Status: AC | PRN
  Administered 2020-03-18: 75 mL via INTRAVENOUS

## 2020-03-18 MED ORDER — CLOPIDOGREL BISULFATE 75 MG PO TABS
75.0000 mg | ORAL_TABLET | Freq: Every day | ORAL | Status: DC
  Administered 2020-03-19 – 2020-03-20 (×2): 75 mg via ORAL
  Filled 2020-03-18 (×2): qty 1

## 2020-03-18 MED ORDER — THIAMINE HCL 100 MG PO TABS
100.0000 mg | ORAL_TABLET | Freq: Every day | ORAL | Status: DC
  Administered 2020-03-19 – 2020-03-20 (×2): 100 mg via ORAL
  Filled 2020-03-18 (×2): qty 1

## 2020-03-18 MED ORDER — ONDANSETRON HCL 4 MG/2ML IJ SOLN: 4.0000 mg | Freq: Four times a day (QID) | INTRAMUSCULAR | Status: DC | PRN

## 2020-03-18 MED ORDER — ONDANSETRON HCL 4 MG PO TABS: 4.0000 mg | ORAL_TABLET | Freq: Four times a day (QID) | ORAL | Status: DC | PRN

## 2020-03-18 MED ORDER — OXYCODONE HCL 5 MG PO TABS
10.0000 mg | ORAL_TABLET | Freq: Every day | ORAL | Status: DC | PRN
  Administered 2020-03-18 – 2020-03-19 (×2): 10 mg via ORAL
  Filled 2020-03-18 (×2): qty 2

## 2020-03-18 MED ORDER — SACUBITRIL-VALSARTAN 97-103 MG PO TABS
1.0000 | ORAL_TABLET | Freq: Two times a day (BID) | ORAL | Status: DC
  Administered 2020-03-18 – 2020-03-20 (×4): 1 via ORAL
  Filled 2020-03-18 (×6): qty 1

## 2020-03-18 MED ORDER — ATORVASTATIN CALCIUM 20 MG PO TABS
40.0000 mg | ORAL_TABLET | Freq: Every day | ORAL | Status: DC
  Administered 2020-03-19 – 2020-03-20 (×2): 40 mg via ORAL
  Filled 2020-03-18 (×2): qty 2

## 2020-03-18 MED ORDER — CARVEDILOL 3.125 MG PO TABS: 6.2500 mg | ORAL_TABLET | Freq: Two times a day (BID) | ORAL | Status: DC

## 2020-03-18 MED ORDER — ACETAMINOPHEN 650 MG RE SUPP: 650.0000 mg | Freq: Four times a day (QID) | RECTAL | Status: DC | PRN

## 2020-03-18 MED ORDER — OMEGA-3-ACID ETHYL ESTERS 1 G PO CAPS
2.0000 g | ORAL_CAPSULE | Freq: Two times a day (BID) | ORAL | Status: DC
  Administered 2020-03-18 – 2020-03-20 (×4): 2 g via ORAL
  Filled 2020-03-18 (×5): qty 2

## 2020-03-18 NOTE — ED Notes (Signed)
Pharmacy tech at bedside /  Patient given meal tray

## 2020-03-18 NOTE — H&P (Signed)
History and Physical   ODIES DESA OAC:[id number] DOB: 1970-06-06 DOA: 03/18/2020  Referring MD/NP/PA: Dr. Charna Archer  PCP: Patient, No Pcp Per   Outpatient Specialists: [facility]  Patient coming from: Home  Chief Complaint: Chest pain and near syncope  HPI: William Crawford is a 49 y.o. male with medical history significant of coronary artery disease, GERD, tobacco abuse, recent LAD perforation and tamponade with VF arrest and ischemic cardiomyopathy leading to ECMO, patient also has right atrial thrombus.  Patient had angioplasty back in June when he had LAD perforation.  He had associated tamponade that required ECMO followed by Impella.  He had recent echocardiogram showing normal EF of 55%.  He has been doing fine since then with no new issues.  He was fully anticoagulated on to recently was Coumadin.  Patient came in secondary to sudden onset of central chest pain that was sharp like a knife being stuck in his chest.  Rated as 8 out of 10 at the beginning.  Associated with some nausea and diaphoresis.  Denied any radiation.  Denied any fever chills or shortness of breath.  Denied any cough.  He came to the ER where he had minimally elevated troponin in the indeterminate range.  Suspected to have early NSTEMI but at this point chest pain-free.  Patient also denied any complaints at this point.  Cardiology consulted with recommendation to admit patient for chest pain work-up.  During that episode he felt lightheaded but did not pass out.  His EKG showed no significant change at this point..  ED Course: Temperature 97.5 blood pressure 120/67 pulse 57 respiratory rate of 20 oxygen sat 94% on room air.  White count is 6.4 hemoglobin 11.8 and platelets 238.  Sodium 135 potassium 3.7 chloride 100 CO2 25 BUN 14 creatinine 1.29 calcium 9.1.  Troponins are 18 and 26.  INR is 1.0 glucose 123.  COVID-19 screen is negative.  CT angiogram of the chest showed a focal 1.7 cm outpouching of the  distal ascending aorta suspicious for pseudoaneurysm probably related to ECMO catheter placement with surgical consultation recommended.  Chest x-ray showed no acute cardiopulmonary findings.  Patient being admitted for rule out MI.  Review of Systems: As per HPI otherwise 10 point review of systems negative.    Past Medical History:  Diagnosis Date   Alcohol abuse    GERD (gastroesophageal reflux disease)    Tobacco abuse     Past Surgical History:  Procedure Laterality Date   APPLICATION OF WOUND VAC  10/25/2019   Procedure: Application Of Wound Vac;  Surgeon: [name], MD;  Location: [place];  Service: Open Heart Surgery;;   CANNULATION FOR ECMO (EXTRACORPOREAL MEMBRANE OXYGENATION)  10/25/2019   Procedure: Cannulation For Ecmo (Extracorporeal Membrane Oxygenation) @ 0109;  Surgeon: [name], MD;  Location: [place];  Service: Open Heart Surgery;;   CANNULATION FOR ECMO (EXTRACORPOREAL MEMBRANE OXYGENATION) N/A 10/30/2019   Procedure: DECANNULATION OF ECMO (EXTRACORPOREAL MEMBRANE OXYGENATION);  Surgeon: Prescott Gum, Collier Salina, MD;  Location: [place];  Service: Open Heart Surgery;  Laterality: N/A;  Pump standby   CHEST EXPLORATION  10/25/2019   Procedure: Chest Exploration for repair of coronary perforation (repair of LAD);  Surgeon: Prescott Gum, Collier Salina, MD;  Location: [place];  Service: Open Heart Surgery;;   CORONARY CTO INTERVENTION N/A 10/25/2019   Procedure: CORONARY CTO INTERVENTION;  Surgeon: Martinique, Peter M, MD;  Location: [place] [facility];  Service: Cardiovascular;  Laterality: N/A;  CORONARY STENT INTERVENTION N/A 10/17/2019   Procedure: CORONARY STENT INTERVENTION;  Surgeon: Sherren Mocha, MD;  Location: [place] [facility];  Service: Cardiovascular;  Laterality: N/A;   LEFT HEART CATH AND CORONARY ANGIOGRAPHY N/A 10/17/2019   Procedure: LEFT HEART CATH AND CORONARY ANGIOGRAPHY;  Surgeon: Sherren Mocha, MD;  Location: [place] [facility];  Service: Cardiovascular;   Laterality: N/A;   [place]  10/25/2019   Procedure: Median Sternotomy.;  Surgeon: [name], MD;  Location[facility];  Service: Open Heart Surgery;;   PLACEMENT OF IMPELLA LEFT VENTRICULAR ASSIST DEVICE  10/30/2019   Procedure: Placement Of Impella Left Ventricular Assist Device 5.5 USING 10MM HEMASHIELD PLATINUM GRAFT;  Surgeon: [name], MD;  Location: [place];  Service: Open Heart Surgery;;  graft placed into aorta   REMOVAL OF IMPELLA LEFT VENTRICULAR ASSIST DEVICE N/A 11/03/2019   Procedure: REMOVAL OF IMPELLA 5.5 LEFT VENTRICULAR ASSIST DEVICE;  Surgeon: [name], MD;  Location: [place];  Service: Open Heart Surgery;  Laterality: N/A;   STERNAL CLOSURE N/A 10/30/2019   Procedure: Sternal Closure;  Surgeon: [name], MD;  Location: [place];  Service: Open Heart Surgery;  Laterality: N/A;   TEE WITHOUT CARDIOVERSION N/A 10/25/2019   Procedure: TRANSESOPHAGEAL ECHOCARDIOGRAM (TEE);  Surgeon: Prescott Gum, Collier Salina, MD;  Location: [place];  Service: Open Heart Surgery;  Laterality: N/A;   TEE WITHOUT CARDIOVERSION N/A 10/30/2019   Procedure: TRANSESOPHAGEAL ECHOCARDIOGRAM (TEE);  Surgeon: Prescott Gum, Collier Salina, MD;  Location: [place];  Service: Open Heart Surgery;  Laterality: N/A;   TEE WITHOUT CARDIOVERSION N/A 11/03/2019   Procedure: TRANSESOPHAGEAL ECHOCARDIOGRAM (TEE);  Surgeon: Prescott Gum, Collier Salina, MD;  Location: [place];  Service: Open Heart Surgery;  Laterality: N/A;     reports that he quit smoking about 4 months ago. He has never used smokeless tobacco. He reports current alcohol use. He reports that he does not use drugs.  No Known Allergies  Family History  Problem Relation Age of Onset   Heart attack Father      Prior to Admission medications   Medication Sig Start Date End Date Taking? Authorizing Provider  aspirin EC 81 MG tablet Take 1 tablet (81 mg total) by mouth daily. Swallow whole. 02/20/20  Yes Larey Dresser, MD  atorvastatin (LIPITOR) 40 MG tablet Take 1  tablet (40 mg total) by mouth daily. 02/02/20  Yes Larey Dresser, MD  carvedilol (COREG) 6.25 MG tablet Take 1 tablet (6.25 mg total) by mouth 2 (two) times daily with a meal. 02/20/20  Yes Larey Dresser, MD  cetirizine (ZYRTEC) 10 MG tablet Take 10 mg by mouth daily.   Yes [provider]  clopidogrel (PLAVIX) 75 MG tablet Take 1 tablet (75 mg total) by mouth daily. 02/02/20  Yes Larey Dresser, MD  gabapentin (NEURONTIN) 300 MG capsule Take 2 capsules (600 mg total) by mouth 3 (three) times daily. 02/07/20 03/18/20 Yes [name], MD  icosapent Ethyl (VASCEPA) 1 g capsule Take 2 capsules (2 g total) by mouth 2 (two) times daily. 01/12/20  Yes Larey Dresser, MD  oxyCODONE (OXY IR/ROXICODONE) 5 MG immediate release tablet Take 2 tablets (10 mg total) by mouth daily as needed for severe pain. 03/06/20 04/05/20 Yes [name], MD  pregabalin (LYRICA) 150 MG capsule Take 1 capsule (150 mg total) by mouth daily. Take at bedtime 02/07/20  Yes Prescott Gum, Collier Salina, MD  sacubitril-valsartan (ENTRESTO) 97-103 MG Take 1 tablet by mouth  2 (two) times daily. 03/14/20  Yes Larey Dresser, MD  spironolactone (ALDACTONE) 25 MG tablet Take 1 tablet (25 mg total) by mouth daily. 03/14/20  Yes Larey Dresser, MD  thiamine 100 MG tablet Take 1 tablet (100 mg total) by mouth daily. 11/12/19  Yes Conte, Tessa N, PA-C  torsemide (DEMADEX) 20 MG tablet Take 1 tablet (20 mg total) by mouth every other day. 02/02/20  Yes Larey Dresser, MD  ketotifen (ZADITOR) 0.025 % ophthalmic solution Place 1 drop into both eyes 2 (two) times daily. Patient not taking: Reported on 03/18/2020 11/11/19   Elgie Collard, [place]    Physical Exam: Vitals:   03/18/20 1306 03/18/20 1307 03/18/20 1645  BP:  120/67 113/75  Pulse:  (!) 57 (!) 53  Resp:  20 13  Temp:  98.5 F (36.9 C)   TempSrc:  Oral   SpO2:  94% 100%  Weight: 86.2 kg    Height: 5' 8"  (1.727 m)        Constitutional: NAD, calm,  comfortable Vitals:   03/18/20 1306 03/18/20 1307 03/18/20 1645  BP:  120/67 113/75  Pulse:  (!) 57 (!) 53  Resp:  20 13  Temp:  98.5 F (36.9 C)   TempSrc:  Oral   SpO2:  94% 100%  Weight: 86.2 kg    Height: 5' 8"  (1.727 m)     Eyes: PERRL, lids and conjunctivae normal ENMT: Mucous membranes are moist. Posterior pharynx clear of any exudate or lesions.Normal dentition.  Neck: normal, supple, no masses, no thyromegaly Respiratory: clear to auscultation bilaterally, no wheezing, no crackles. Normal respiratory effort. No accessory muscle use.  Cardiovascular: Regular rate and rhythm, no murmurs / rubs / gallops. No extremity edema. 2+ pedal pulses. No carotid bruits.  Abdomen: no tenderness, no masses palpated. No hepatosplenomegaly. Bowel sounds positive.  Musculoskeletal: no clubbing / cyanosis. No joint deformity upper and lower extremities. Good ROM, no contractures. Normal muscle tone.  Skin: no rashes, lesions, ulcers. No induration Neurologic: CN 2-12 grossly intact. Sensation intact, DTR normal. Strength 5/5 in all 4.  Psychiatric: Normal judgment and insight. Alert and oriented x 3. Normal mood.     Labs on Admission: I have personally reviewed following labs and imaging studies  CBC: Recent Labs  Lab 03/18/20 1309  WBC 6.4  HGB 11.8*  HCT 34.9*  MCV 89.5  PLT 748   Basic Metabolic Panel: Recent Labs  Lab 03/18/20 1309  NA 135  K 3.7  CL 100  CO2 25  GLUCOSE 123*  BUN 14  CREATININE 1.29*  CALCIUM 9.1   GFR: Estimated Creatinine Clearance: 75.6 mL/min (A) (by C-G formula based on SCr of 1.29 mg/dL (H)). Liver Function Tests: Recent Labs  Lab 03/18/20 1309  AST 20  ALT 23  ALKPHOS 76  BILITOT 0.7  PROT 7.3  ALBUMIN 4.5   No results for input(s): LIPASE, AMYLASE in the last 168 hours. No results for input(s): AMMONIA in the last 168 hours. Coagulation Profile: Recent Labs  Lab 03/18/20 1656  INR 1.0   Cardiac Enzymes: No results for  input(s): CKTOTAL, CKMB, CKMBINDEX, TROPONINI in the last 168 hours. BNP (last 3 results) No results for input(s): PROBNP in the last 8760 hours. HbA1C: No results for input(s): HGBA1C in the last 72 hours. CBG: No results for input(s): GLUCAP in the last 168 hours. Lipid Profile: No results for input(s): CHOL, HDL, LDLCALC, TRIG, CHOLHDL, LDLDIRECT in the last 72 hours. Thyroid Function  Tests: No results for input(s): TSH, T4TOTAL, FREET4, T3FREE, THYROIDAB in the last 72 hours. Anemia Panel: No results for input(s): VITAMINB12, FOLATE, FERRITIN, TIBC, IRON, RETICCTPCT in the last 72 hours. Urine analysis:    Component Value Date/Time   COLORURINE YELLOW 10/29/2019 0957   APPEARANCEUR CLEAR 10/29/2019 0957   LABSPEC 1.018 10/29/2019 0957   PHURINE 6.0 10/29/2019 0957   GLUCOSEU NEGATIVE 10/29/2019 0957   HGBUR NEGATIVE 10/29/2019 0957   BILIRUBINUR NEGATIVE 10/29/2019 0957   KETONESUR NEGATIVE 10/29/2019 0957   PROTEINUR NEGATIVE 10/29/2019 0957   NITRITE NEGATIVE 10/29/2019 0957   LEUKOCYTESUR NEGATIVE 10/29/2019 0957   Sepsis Labs: @LABRCNTIP (procalcitonin:4,lacticidven:4) ) Recent Results (from the past 240 hour(s))  Respiratory Panel by RT PCR (Flu A&B, Covid) - Nasopharyngeal Swab     Status: None   Collection Time: 03/18/20  5:11 PM   Specimen: Nasopharyngeal Swab  Result Value Ref Range Status   SARS Coronavirus 2 by RT PCR NEGATIVE NEGATIVE Final    Comment: (NOTE) SARS-CoV-2 target nucleic acids are NOT DETECTED.  The SARS-CoV-2 RNA is generally detectable in upper respiratoy specimens during the acute phase of infection. The lowest concentration of SARS-CoV-2 viral copies this assay can detect is 131 copies/mL. A negative result does not preclude SARS-Cov-2 infection and should not be used as the sole basis for treatment or other patient management decisions. A negative result may occur with  improper specimen collection/handling, submission of specimen  other than nasopharyngeal swab, presence of viral mutation(s) within the areas targeted by this assay, and inadequate number of viral copies (<131 copies/mL). A negative result must be combined with clinical observations, patient history, and epidemiological information. The expected result is Negative.  Fact Sheet for Patients:  PinkCheek.be  Fact Sheet for Healthcare Providers:  GravelBags.it  This test is no t yet approved or cleared by the [place] FDA and  has been authorized for detection and/or diagnosis of SARS-CoV-2 by FDA under an Emergency Use Authorization (EUA). This EUA will remain  in effect (meaning this test can be used) for the duration of the COVID-19 declaration under Section 564(b)(1) of the Act, 21 U.S.C. section 360bbb-3(b)(1), unless the authorization is terminated or revoked sooner.     Influenza A by PCR NEGATIVE NEGATIVE Final   Influenza B by PCR NEGATIVE NEGATIVE Final    Comment: (NOTE) The Xpert Xpress SARS-CoV-2/FLU/RSV assay is intended as an aid in  the diagnosis of influenza from Nasopharyngeal swab specimens and  should not be used as a sole basis for treatment. Nasal washings and  aspirates are unacceptable for Xpert Xpress SARS-CoV-2/FLU/RSV  testing.  Fact Sheet for Patients: PinkCheek.be  Fact Sheet for Healthcare Providers: GravelBags.it  This test is not yet approved or cleared by the [place] FDA and  has been authorized for detection and/or diagnosis of SARS-CoV-2 by  FDA under an Emergency Use Authorization (EUA). This EUA will remain  in effect (meaning this test can be used) for the duration of the  Covid-19 declaration under Section 564(b)(1) of the Act, 21  U.S.C. section 360bbb-3(b)(1), unless the authorization is  terminated or revoked. Performed at [facility], [address]  [place], [place] [address]      Radiological Exams on Admission: DG Chest 2 View  Result Date: 03/18/2020 CLINICAL DATA:  Chest pain EXAM: CHEST - 2 VIEW COMPARISON:  12/13/2019 FINDINGS: The heart size and mediastinal contours are within normal limits. Both lungs are clear. The visualized skeletal structures are unremarkable. Interval clearing  right upper lobe atelectasis. IMPRESSION: No active cardiopulmonary disease. Electronically Signed   By: Franchot Gallo M.D.   On: 03/18/2020 13:56   CT Angio Chest PE W/Cm &/Or Wo Cm  Result Date: 03/18/2020 CLINICAL DATA:  Substernal chest pain. EXAM: CT ANGIOGRAPHY CHEST WITH CONTRAST TECHNIQUE: Multidetector CT imaging of the chest was performed using the standard protocol during bolus administration of intravenous contrast. Multiplanar CT image reconstructions and MIPs were obtained to evaluate the vascular anatomy. CONTRAST:  64m OMNIPAQUE IOHEXOL 350 MG/ML SOLN COMPARISON:  None. FINDINGS: Cardiovascular: There is no evidence for a thoracic aortic aneurysm. The patient is status post prior median sternotomy. There is no large centrally located pulmonary embolism. Detection of smaller pulmonary emboli is limited by respiratory motion artifact. There is a focal outpouching arising from the distal ascending aorta measuring approximately 1.7 cm. This outpouching is filled with contrast and connects to a tract coursing superiorly towards the patient's thoracic inlet. This is likely related to ECMO. There is moderate narrowing of the proximal left carotid artery. Mediastinum/Nodes: -- No mediastinal lymphadenopathy. -- No hilar lymphadenopathy. -- No axillary lymphadenopathy. -- No supraclavicular lymphadenopathy. -- Normal thyroid gland where visualized. -  Unremarkable esophagus. Lungs/Pleura: Airways are patent. No pleural effusion, lobar consolidation, pneumothorax or pulmonary infarction. Upper Abdomen: Contrast bolus timing is not optimized for evaluation of  the abdominal organs. The visualized portions of the organs of the upper abdomen are normal. Musculoskeletal: No chest wall abnormality. No bony spinal canal stenosis. Review of the MIP images confirms the above findings. IMPRESSION: 1. Focal 1.7 cm outpouching of the distal ascending thoracic aorta is suspicious for a pseudoaneurysm related to ECMO catheter placement. Surgical consultation is recommended. 2. No evidence for an acute pulmonary embolism. Electronically Signed   By: CConstance HolsterM.D.   On: 03/18/2020 17:55    EKG: Independently reviewed.  Shows sinus tachycardia with a rate in the 50s with right bundle branch block.  Unchanged from previous.  Assessment/Plan Principal Problem:   Syncope Active Problems:   Tobacco use   Ischemic cardiomyopathy   Hypertension   Hyperlipemia   Alcohol abuse   CAD (coronary artery disease)   Atrial thrombus   Personal history of ECMO     #1  Chest pain and presyncope: Coarse anteriorly and clear.  Call be arrhythmias or non-ST elevation MI.  Symptoms may also be related to dehydration or other cardiac causes.  Patient will be admitted for observation.  Cycle his enzymes.  Cardiology consulted.  Heparin drip as well as supportive care for possible NSTEMI.  Continue home regimen.  #2  Essential hypertension: Resume home regimen.  #3 hyperlipidemia: Continue statin  #4 history of alcohol abuse: No no recent alcohol intake.  Continue to monitor.  #5 pseudoaneurysm of the aorta: Vascular surgery consulted and will evaluate patient in the morning.  #6 tobacco abuse: Offered nicotine patch.   DVT prophylaxis: Heparin Code Status: Full code Family Communication: No family at bedside Disposition Plan: Home Consults called: Cardiology and thoracic surgery consulted by ER Admission status: Observation  Severity of Illness: The appropriate patient status for this patient is OBSERVATION. Observation status is judged to be reasonable and  necessary in order to provide the required intensity of service to ensure the patient's safety. The patient's presenting symptoms, physical exam findings, and initial radiographic and laboratory data in the context of their medical condition is felt to place them at decreased risk for further clinical deterioration. Furthermore, it is anticipated that the  patient will be medically stable for discharge from the hospital within 2 midnights of admission. The following factors support the patient status of observation.   " The patient's presenting symptoms include chest pain and near syncope. " The physical exam findings include no significant findings on exam. " The initial radiographic and laboratory data are mildly elevated troponins.     Barbette Merino MD [facility] Pager [phone]  If 7PM-7AM, please contact night-coverage [email or web address] Password [facility]  03/18/2020, 8:17 PM

## 2020-03-18 NOTE — ED Notes (Signed)
Provider at bedside

## 2020-03-18 NOTE — ED Triage Notes (Signed)
Pt presents to ED via POV from work. Pt states went to the bathroom and when he came back he had sudden onset substernal chest pain, pt states "my vision went black and I heard my heart beat in my ears". Pt reports per coworker he became pale. Pt with noted cardiac surgical scar to center chest. Pt presents A&O x4. Pt denies pain at this time.

## 2020-03-18 NOTE — ED Notes (Addendum)
Labs collected and sent down

## 2020-03-18 NOTE — ED Provider Notes (Signed)
[facility] Emergency Department Provider Note   ____________________________________________   First MD Initiated Contact with Patient 03/18/20 1633     (approximate)  I have reviewed the triage vital signs and the nursing notes.   HISTORY  Chief Complaint Chest Pain and Near Syncope    HPI William Crawford is a 49 y.o. male with past medical history of CAD, LAD perforation and resulting tamponade, VF arrest, ischemic cardiomyopathy, and right atrial thrombus who presents to the ED complaining of near syncope and chest pain.  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  After walking back he had sudden onset substernal chest pain that he describes as "like a hot knife going into my chest."  At this time, he also felt like he was going to pass out and his vision started to go black.  He denies actually passing out and did not have any shortness of breath with this episode.  He now states he feels back to normal, denies any ongoing chest pain or shortness of breath.  He has been feeling well recently with no fevers, cough, vomiting, diarrhea, or abdominal pain.  He had LAD perforation with angioplasty in June of this year, had associated tamponade, required ECMO followed by Impella placement at that time.  He has been doing some well since then and most recent echocardiogram last month showed recovery of his EF to 55%.  He was recently taken off of Coumadin due to resolution of right atrial thrombus.        Past Medical History:  Diagnosis Date  . Alcohol abuse   . GERD (gastroesophageal reflux disease)   . Tobacco abuse     Patient Active Problem List   Diagnosis Date Noted  . Neuropathic pain 02/07/2020  . Chest wall discomfort 01/17/2020  . Encounter for post surgical wound check 01/03/2020  . Post-op pain 12/20/2019  . Encounter for postoperative wound check 12/13/2019  . Personal history of ECMO 11/22/2019  . Postop  check 11/17/2019  . Encounter for therapeutic drug monitoring 11/14/2019  . Atrial thrombus 11/14/2019  . Endotracheally intubated   . 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([place])   . Status post cardiac surgery   . Acute respiratory failure with hypoxia ([place])   . CAD (coronary artery disease) 10/25/2019  . Angina pectoris ([place]) 10/25/2019  . Myocardial infarction acute ([place]) 10/25/2019  . Cardiogenic shock ([place]) 10/25/2019  . Tobacco use 10/18/2019  . Ischemic cardiomyopathy 10/18/2019  . Hypertension 10/18/2019  . Hyperlipemia 10/18/2019  . Alcohol abuse 10/18/2019  . NSTEMI (non-ST elevated myocardial infarction) ([place]) 10/17/2019    Past Surgical History:  Procedure Laterality Date  . APPLICATION OF WOUND VAC  10/25/2019   Procedure: Application Of Wound Vac;  Surgeon: [name], MD;  Location: [place];  Service: Open Heart Surgery;;  . CANNULATION FOR ECMO (EXTRACORPOREAL MEMBRANE OXYGENATION)  10/25/2019   Procedure: Cannulation For Ecmo (Extracorporeal Membrane Oxygenation) @ 4970;  Surgeon: [name], MD;  Location: [place];  Service: Open Heart Surgery;;  . CANNULATION FOR ECMO (EXTRACORPOREAL MEMBRANE OXYGENATION) N/A 10/30/2019   Procedure: DECANNULATION OF ECMO (EXTRACORPOREAL MEMBRANE OXYGENATION);  Surgeon: Prescott Gum, Collier Salina, MD;  Location: [place];  Service: Open Heart Surgery;  Laterality: N/A;  Pump standby  . CHEST EXPLORATION  10/25/2019   Procedure: Chest Exploration for repair of coronary perforation (repair of LAD);  Surgeon: Prescott Gum, Collier Salina, MD;  Location: [place];  Service: Open Heart Surgery;;  . CORONARY CTO INTERVENTION N/A 10/25/2019   Procedure: CORONARY CTO INTERVENTION;  Surgeon: Martinique, Peter M, MD;  Location: [place] [facility];  Service: Cardiovascular;  Laterality: N/A;  . CORONARY STENT INTERVENTION N/A 10/17/2019   Procedure: CORONARY STENT INTERVENTION;  Surgeon: Sherren Mocha, MD;  Location: [place] [facility];  Service: Cardiovascular;   Laterality: N/A;  . LEFT HEART CATH AND CORONARY ANGIOGRAPHY N/A 10/17/2019   Procedure: LEFT HEART CATH AND CORONARY ANGIOGRAPHY;  Surgeon: Sherren Mocha, MD;  Location: [place] [facility];  Service: Cardiovascular;  Laterality: N/A;  . MEDIASTERNOTOMY  10/25/2019   Procedure: Median Sternotomy.;  Surgeon: [name], MD;  Location: [place];  Service: Open Heart Surgery;;  . PLACEMENT OF IMPELLA LEFT VENTRICULAR ASSIST DEVICE  10/30/2019   Procedure: Placement Of Impella Left Ventricular Assist Device 5.5 USING 10MM HEMASHIELD PLATINUM GRAFT;  Surgeon: [name], MD;  Location: [place];  Service: Open Heart Surgery;;  graft placed into aorta  . REMOVAL OF IMPELLA LEFT VENTRICULAR ASSIST DEVICE N/A 11/03/2019   Procedure: REMOVAL OF IMPELLA 5.5 LEFT VENTRICULAR ASSIST DEVICE;  Surgeon: [name], MD;  Location: [place];  Service: Open Heart Surgery;  Laterality: N/A;  . STERNAL CLOSURE N/A 10/30/2019   Procedure: Sternal Closure;  Surgeon: [name], MD;  Location: [place];  Service: Open Heart Surgery;  Laterality: N/A;  . TEE WITHOUT CARDIOVERSION N/A 10/25/2019   Procedure: TRANSESOPHAGEAL ECHOCARDIOGRAM (TEE);  Surgeon: Prescott Gum, Collier Salina, MD;  Location: [place];  Service: Open Heart Surgery;  Laterality: N/A;  . TEE WITHOUT CARDIOVERSION N/A 10/30/2019   Procedure: TRANSESOPHAGEAL ECHOCARDIOGRAM (TEE);  Surgeon: Prescott Gum, Collier Salina, MD;  Location: [place];  Service: Open Heart Surgery;  Laterality: N/A;  . TEE WITHOUT CARDIOVERSION N/A 11/03/2019   Procedure: TRANSESOPHAGEAL ECHOCARDIOGRAM (TEE);  Surgeon: Prescott Gum, Collier Salina, MD;  Location: [place];  Service: Open Heart Surgery;  Laterality: N/A;    Prior to Admission medications   Medication Sig Start Date End Date Taking? Authorizing Provider  aspirin EC 81 MG tablet Take 1 tablet (81 mg total) by mouth daily. Swallow whole. 02/20/20   Larey Dresser, MD  atorvastatin (LIPITOR) 40 MG tablet Take 1 tablet (40 mg total) by mouth daily. 02/02/20    Larey Dresser, MD  carvedilol (COREG) 6.25 MG tablet Take 1 tablet (6.25 mg total) by mouth 2 (two) times daily with a meal. 02/20/20   Larey Dresser, MD  cetirizine (ZYRTEC) 10 MG tablet Take 10 mg by mouth daily.    [provider]  clopidogrel (PLAVIX) 75 MG tablet Take 1 tablet (75 mg total) by mouth daily. 02/02/20   Larey Dresser, MD  gabapentin (NEURONTIN) 300 MG capsule Take 2 capsules (600 mg total) by mouth 3 (three) times daily. 02/07/20 03/08/20  [name], MD  icosapent Ethyl (VASCEPA) 1 g capsule Take 2 capsules (2 g total) by mouth 2 (two) times daily. 01/12/20   Larey Dresser, MD  ketotifen (ZADITOR) 0.025 % ophthalmic solution Place 1 drop into both eyes 2 (two) times daily. 11/11/19   Elgie Collard, PA-C  oxyCODONE (OXY IR/ROXICODONE) 5 MG immediate release tablet Take 2 tablets (10 mg total) by mouth daily as needed for severe pain. 03/06/20 04/05/20  [name], MD  pregabalin (LYRICA) 150 MG capsule Take 1 capsule (150 mg total) by mouth daily. Take at bedtime 02/07/20   [name], MD  sacubitril-valsartan (ENTRESTO) 97-103 MG Take 1 tablet by mouth 2 (two) times daily. 03/14/20   Larey Dresser, MD  spironolactone (ALDACTONE) 25 MG tablet Take 1 tablet (25 mg total) by mouth daily. 03/14/20   Larey Dresser, MD  thiamine 100 MG tablet Take 1 tablet (100 mg total) by mouth daily. 11/12/19   Elgie Collard, PA-C  torsemide (DEMADEX) 20 MG tablet Take 1 tablet (20 mg total) by mouth every other day. 02/02/20   Larey Dresser, MD    Allergies Patient has no known allergies.  Family History  Problem Relation Age of Onset  . Heart attack Father     Social History Social History   Tobacco Use  . Smoking status: Former Smoker    Quit date: 10/23/2019    Years since quitting: 0.4  . Smokeless tobacco: Never Used  Substance Use Topics  . Alcohol use: Yes  . Drug use: No    Review of Systems  Constitutional: No  fever/chills Eyes: No visual changes. ENT: No sore throat. Cardiovascular: Positive for chest pain.  Positive for near syncope. Respiratory: Denies shortness of breath. Gastrointestinal: No abdominal pain.  No nausea, no vomiting.  No diarrhea.  No constipation. Genitourinary: Negative for dysuria. Musculoskeletal: Negative for back pain. Skin: Negative for rash. Neurological: Negative for headaches, focal weakness or numbness.  ____________________________________________   PHYSICAL EXAM:  VITAL SIGNS: ED Triage Vitals  Enc Vitals Group     BP 03/18/20 1307 120/67     Pulse Rate 03/18/20 1307 (!) 57     Resp 03/18/20 1307 20     Temp 03/18/20 1307 98.5 F (36.9 C)     Temp Source 03/18/20 1307 Oral     SpO2 03/18/20 1307 94 %     Weight 03/18/20 1306 190 lb (86.2 kg)     Height 03/18/20 1306 5' 8"  (1.727 m)     Head Circumference --      Peak Flow --      Pain Score --      Pain Loc --      Pain Edu? --      Excl. in [place]? --     Constitutional: Alert and oriented. Eyes: Conjunctivae are normal. Head: Atraumatic. Nose: No congestion/rhinnorhea. Mouth/Throat: Mucous membranes are moist. Neck: Normal ROM Cardiovascular: Normal rate, regular rhythm. Grossly normal heart sounds.  2+ radial pulses bilaterally. Respiratory: Normal respiratory effort.  No retractions. Lungs CTAB. Gastrointestinal: Soft and nontender. No distention. Genitourinary: deferred Musculoskeletal: No lower extremity tenderness nor edema. Neurologic:  Normal speech and language. No gross focal neurologic deficits are appreciated. Skin:  Skin is warm, dry and intact. No rash noted. Psychiatric: Mood and affect are normal. Speech and behavior are normal.  ____________________________________________   LABS (all labs ordered are listed, but only abnormal results are displayed)  Labs Reviewed  CBC - Abnormal; Notable for the following components:      Result Value   RBC 3.90 (*)    Hemoglobin  11.8 (*)    HCT 34.9 (*)    All other components within normal limits  COMPREHENSIVE METABOLIC PANEL - Abnormal; Notable for the following components:   Glucose, Bld 123 (*)    Creatinine, Ser 1.29 (*)    All other components within normal limits  TROPONIN I (HIGH SENSITIVITY) - Abnormal; Notable for the following components:   Troponin I (High Sensitivity) 18 (*)    All other components within normal limits  TROPONIN I (HIGH SENSITIVITY) -  Abnormal; Notable for the following components:   Troponin I (High Sensitivity) 26 (*)    All other components within normal limits  RESPIRATORY PANEL BY RT PCR (FLU A&B, COVID)  PROTIME-INR   ____________________________________________  EKG  ED ECG REPORT I, Blake Divine, the attending physician, personally viewed and interpreted this ECG.   Date: 03/18/2020  EKG Time: 13:00  Rate: 58  Rhythm: sinus bradycardia  Axis: Normal  Intervals:right bundle branch block  ST&T Change: None   PROCEDURES  Procedure(s) performed (including Critical Care):  Procedures   ____________________________________________   INITIAL IMPRESSION / ASSESSMENT AND PLAN / ED COURSE       49 year old male with past medical history of CAD, LAD perforation with tamponade also requiring ECMO and Impella placement, ischemic cardiomyopathy, and right atrial thrombus recently weaned off of anticoagulation who presents to the ED complaining of chest pain with near syncopal episode earlier today.  Work-up thus far is reassuring, EKG shows no acute ischemic changes, does show right bundle branch block similar to previous.  2 sets of troponin are mildly elevated but stable and he denies any ongoing chest pain or shortness of breath.  Given his history of right atrial thrombus, he would also be at high risk for PE and we will further assess with CTA chest.  Plan to discuss with cardiology, anticipate admission for further observation.  Case discussed with Dr. Oval Linsey  of cardiology, who agrees with plan for admission and cardiology will follow during admission.  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  Case discussed with Dr. Delana Meyer of vascular surgery, who feels this finding would be unrelated to his chest pain or near syncope.  No acute intervention for this needed at this time although patient would benefit from cardiothoracic consultation during admission.  Case discussed with hospitalist for admission.      ____________________________________________   FINAL CLINICAL IMPRESSION(S) / ED DIAGNOSES  Final diagnoses:  Chest pain, unspecified type  Near syncope  Thoracic aortic aneurysm without rupture [facility])     ED Discharge Orders    None       Note:  This document was prepared using Dragon voice recognition software and may include unintentional dictation errors.   Blake Divine, MD 03/18/20 [phone]

## 2020-03-18 NOTE — ED Notes (Signed)
Called lab to verify they received blood, tech states he does have it.

## 2020-03-19 ENCOUNTER — Encounter: Payer: Self-pay | Admitting: Internal Medicine

## 2020-03-19 ENCOUNTER — Observation Stay (HOSPITAL_BASED_OUTPATIENT_CLINIC_OR_DEPARTMENT_OTHER)
Admit: 2020-03-19 | Discharge: 2020-03-19 | Disposition: A | Discharge status: Home / Self Care | Payer: BLUE CROSS/BLUE SHIELD | Attending: Physician Assistant | Admitting: Physician Assistant

## 2020-03-19 DIAGNOSIS — I5043 Acute on chronic combined systolic (congestive) and diastolic (congestive) heart failure: Secondary | ICD-10-CM

## 2020-03-19 DIAGNOSIS — I5020 Unspecified systolic (congestive) heart failure: Secondary | ICD-10-CM | POA: Diagnosis not present

## 2020-03-19 DIAGNOSIS — I2510 Atherosclerotic heart disease of native coronary artery without angina pectoris: Secondary | ICD-10-CM | POA: Diagnosis not present

## 2020-03-19 DIAGNOSIS — I513 Intracardiac thrombosis, not elsewhere classified: Secondary | ICD-10-CM | POA: Diagnosis not present

## 2020-03-19 DIAGNOSIS — F1010 Alcohol abuse, uncomplicated: Secondary | ICD-10-CM | POA: Diagnosis not present

## 2020-03-19 DIAGNOSIS — I255 Ischemic cardiomyopathy: Secondary | ICD-10-CM

## 2020-03-19 DIAGNOSIS — R55 Syncope and collapse: Secondary | ICD-10-CM

## 2020-03-19 DIAGNOSIS — I25119 Atherosclerotic heart disease of native coronary artery with unspecified angina pectoris: Secondary | ICD-10-CM

## 2020-03-19 DIAGNOSIS — I10 Essential (primary) hypertension: Secondary | ICD-10-CM

## 2020-03-19 DIAGNOSIS — E782 Mixed hyperlipidemia: Secondary | ICD-10-CM

## 2020-03-19 DIAGNOSIS — Z9281 Personal history of extracorporeal membrane oxygenation (ECMO): Secondary | ICD-10-CM

## 2020-03-19 DIAGNOSIS — Z720 Tobacco use: Secondary | ICD-10-CM

## 2020-03-19 LAB — COMPREHENSIVE METABOLIC PANEL
ALT: 19 U/L (ref 0–44)
AST: 12 U/L — ABNORMAL LOW (ref 15–41)
Albumin: 4.3 g/dL (ref 3.5–5.0)
Alkaline Phosphatase: 73 U/L (ref 38–126)
Anion gap: 8 (ref 5–15)
BUN: 12 mg/dL (ref 6–20)
CO2: 26 mmol/L (ref 22–32)
Calcium: 8.7 mg/dL — ABNORMAL LOW (ref 8.9–10.3)
Chloride: 103 mmol/L (ref 98–111)
Creatinine, Ser: 0.91 mg/dL (ref 0.61–1.24)
GFR, Estimated: >60 mL/min (ref >60)
Glucose, Bld: 99 mg/dL (ref 70–99)
Potassium: 3.6 mmol/L (ref 3.5–5.1)
Sodium: 137 mmol/L (ref 135–145)
Total Bilirubin: 0.5 mg/dL (ref 0.3–1.2)
Total Protein: 7.1 g/dL (ref 6.5–8.1)

## 2020-03-19 LAB — CREATININE, SERUM
Creatinine, Ser: 1.08 mg/dL (ref 0.61–1.24)
GFR, Estimated: >60 mL/min (ref >60)

## 2020-03-19 LAB — CBC
HCT: 36.3 % — ABNORMAL LOW (ref 39.0–52.0)
Hemoglobin: 12.1 g/dL — ABNORMAL LOW (ref 13.0–17.0)
MCH: 30.2 pg (ref 26.0–34.0)
MCHC: 33.3 g/dL (ref 30.0–36.0)
MCV: 90.5 fL (ref 80.0–100.0)
Platelets: 228 10*3/uL (ref 150–400)
RBC: 4.01 MIL/uL — ABNORMAL LOW (ref 4.22–5.81)
RDW: 13.2 % (ref 11.5–15.5)
WBC: 6.3 10*3/uL (ref 4.0–10.5)
nRBC: 0 % (ref 0.0–0.2)

## 2020-03-19 LAB — ECHOCARDIOGRAM LIMITED
Height: 68 in
S' Lateral: 3.42 cm
Weight: 3039.98 oz

## 2020-03-19 LAB — TROPONIN I (HIGH SENSITIVITY)
Troponin I (High Sensitivity): 29 ng/L — ABNORMAL HIGH (ref <18)
Troponin I (High Sensitivity): 30 ng/L — ABNORMAL HIGH (ref <18)

## 2020-03-19 LAB — MAGNESIUM: Magnesium: 2.3 mg/dL (ref 1.7–2.4)

## 2020-03-19 LAB — TSH: TSH: 4.584 u[IU]/mL — ABNORMAL HIGH (ref 0.350–4.500)

## 2020-03-19 LAB — GLUCOSE, CAPILLARY: Glucose-Capillary: 75 mg/dL (ref 70–99)

## 2020-03-19 MED ORDER — PERFLUTREN LIPID MICROSPHERE
1.0000 mL | INTRAVENOUS | Status: AC | PRN
  Administered 2020-03-19: 2 mL via INTRAVENOUS
  Filled 2020-03-19: qty 10

## 2020-03-19 MED ORDER — OXYCODONE HCL 5 MG PO TABS
10.0000 mg | ORAL_TABLET | Freq: Three times a day (TID) | ORAL | Status: DC | PRN
  Administered 2020-03-19 – 2020-03-20 (×3): 10 mg via ORAL
  Filled 2020-03-19 (×3): qty 2

## 2020-03-19 MED ORDER — SPIRONOLACTONE 25 MG PO TABS
25.0000 mg | ORAL_TABLET | Freq: Every day | ORAL | Status: DC
  Administered 2020-03-19 – 2020-03-20 (×2): 25 mg via ORAL
  Filled 2020-03-19 (×2): qty 1

## 2020-03-19 MED ORDER — GABAPENTIN 300 MG PO CAPS
600.0000 mg | ORAL_CAPSULE | Freq: Three times a day (TID) | ORAL | Status: DC
  Administered 2020-03-19 – 2020-03-20 (×3): 600 mg via ORAL
  Filled 2020-03-19: qty 2
  Filled 2020-03-19: qty 6
  Filled 2020-03-19: qty 2

## 2020-03-19 NOTE — Consult Note (Signed)
Cardiology Consultation:   Patient ID: William Crawford; [id number]; Oct 24, 1970   Admit date: 03/18/2020 Date of Consult: 03/19/2020  Primary Care Provider: Patient, No Pcp Per Primary Cardiologist: Burt Knack Primary Electrophysiologist:  None Advance heart Failure: Aundra Dubin   Patient Profile:   William Crawford is a 49 y.o. male with a hx of CAD with NSTEMI on 5/25/121 status post PCI/DES to the RCA with residual CTO of the proximal LAD status post attempted CTO intervention on 0/05/7492 complicated by LAD perforation, pericardial effusion, and cardiac tamponade requiring emergent OR repair complicated by VF arrest in the OR with approximately 15 minutes of CPR, cardiogenic shock requiring cardiopulmonary bypass, ECMO, and Impella along with vasopressors and inotropic support, right atrial thrombus status post Coumadin, HFrEF secondary to ICM with subsequent normalization of LV systolic function, heavy EtOH use, prior tobacco use, strong family history of CAD, and hypertriglyceridemia who is being seen today for the evaluation of presyncope at the request of Dr. Jonelle Sidle.  History of Present Illness:   William Crawford was initially admitted to [facility] on 10/17/2019 with a NSTEMI. LHC showed 95% distal RCA stenosis which was the culprit lesion with a CTO of the proximal LAD and OM1 with collaterals. He underwent successful PCI/DES to the distal RCA initially. Initial echo at that time showed an EF of 35 to 40% with inferior wall motion normality and normal RV systolic function. He subsequently returned on 6/2 for planned CTO intervention on the LAD. However during the procedure, he had perforation of the LAD with balloon inflation and covered stent placement. Echo showed an EF of 45% with apical hypokinesis, moderate pericardial effusion and evidence of tamponade. He was taken emergently to the OR for LAD repair and drainage of pericardial effusion. In the OR, he had a VF arrest with about 50 minutes  of CPR and was put on cardiopulmonary bypass with the tear in the LAD being repaired. The LAD was not graftable. TEE in the OR showed severe biventricular failure which was suspected to be from standing post VF arrest. He was unable to come off bypass. Therefore, he was cannulated for ECMO with venous drainage from the RA and arterial return to the ascending aorta. After 4 days, ECMO was decannulated and Impella 5.5 was placed. 4 days after this, Impella was removed. During removal of Impella he was noted to have an RA thrombus by TEE and was placed on warfarin. He was eventually weaned off pressors and inotropes. Echo on 6/3 postarrest showed an EF of less than 20% with moderately reduced RV systolic function. Prior to discharge, repeat echo demonstrated his EF had improved to 40 to 45%.  Since his hospitalization earlier this summer he has done remarkably well and is back to work. He has continued to have anterior chest wall pain as well as an intermittent sensation of a "hot sword" radiating down his chest. He was most recently seen by the advanced heart failure team on 02/20/2020 and was doing very well. Repeat echo at that time showed an EF of 55% with mild anteroseptal hypokinesis and normal RV. There was no right atrial thrombus noted and Coumadin was discontinued.  While at work on 10/25, he had ambulated a significant distance to the back of the building he was working and to use the restroom. This area was significantly warmer due to boilers in that area. Upon reaching this area he began to feel dizzy and lightheaded. There was some associated onset of the hot  sort of sensation radiating down his chest along with nausea and diaphoresis. He began to feel like his vision was going dark so he sat down. He did not fully suffer LOC. A coworker noted he was quite pale and diaphoretic prompting him to come to the ED. There was no significant shortness of breath or notable palpitations. No fevers or chills. No  lower extremity swelling, loss of bowel or bladder function. No seizure-like activity.  Upon the patient's arrival to [facility] they were found to have stable vital signs including BP, HR, respiratory rate, oxygen saturation, and temp. EKG showed sinus bradycardia, 58, RBBB, no acute ST-T changes, CXR showed no active cardiopulmonary disease. CTA chest showed no evidence for acute PE with a focal 1.7 cm outpouching of the distal ascending thoracic aorta suspicious for pseudoaneurysm. Labs showed an initial high-sensitivity troponin of 18 with a delta of 26 currently trending to 30, mild AKI with serum creatinine 1.29 currently improved to 0.91, potassium 3.7 trending to 3.6, normal LFT, magnesium 2.3, Hgb 11.7 trending to 12.1, Covid negative. Telemetry has demonstrated sinus bradycardia with underlying right bundle branch block. He has not noted any further knifelike pain radiating to his chest though he does continue to note chest wall discomfort that is worse with positional movement and has been present since his surgery. Currently, he feels back to baseline.  Past Medical History:  Diagnosis Date   Alcohol abuse    CAD (coronary artery disease)    Cardiac arrest ([place])    Cardiogenic shock (HCC)    GERD (gastroesophageal reflux disease)    HFrEF (heart failure with reduced ejection fraction) (HCC)    Hypertriglyceridemia    Ischemic cardiomyopathy    Right atrial thrombus    Tobacco abuse     Past Surgical History:  Procedure Laterality Date   APPLICATION OF WOUND VAC  10/25/2019   Procedure: Application Of Wound Vac;  Surgeon: [name], MD;  Location: [place];  Service: Open Heart Surgery;;   CANNULATION FOR ECMO (EXTRACORPOREAL MEMBRANE OXYGENATION)  10/25/2019   Procedure: Cannulation For Ecmo (Extracorporeal Membrane Oxygenation) @ 1531;  Surgeon: [name], MD;  Location: [place];  Service: Open Heart Surgery;;   CANNULATION FOR ECMO (EXTRACORPOREAL MEMBRANE  OXYGENATION) N/A 10/30/2019   Procedure: DECANNULATION OF ECMO (EXTRACORPOREAL MEMBRANE OXYGENATION);  Surgeon: Prescott Gum, Collier Salina, MD;  Location: [place];  Service: Open Heart Surgery;  Laterality: N/A;  Pump standby   CHEST EXPLORATION  10/25/2019   Procedure: Chest Exploration for repair of coronary perforation (repair of LAD);  Surgeon: Prescott Gum, Collier Salina, MD;  Location: [place];  Service: Open Heart Surgery;;   CORONARY CTO INTERVENTION N/A 10/25/2019   Procedure: CORONARY CTO INTERVENTION;  Surgeon: Martinique, Peter M, MD;  Location: [place] CV LAB;  Service: Cardiovascular;  Laterality: N/A;   CORONARY STENT INTERVENTION N/A 10/17/2019   Procedure: CORONARY STENT INTERVENTION;  Surgeon: Sherren Mocha, MD;  Location: [place] CV LAB;  Service: Cardiovascular;  Laterality: N/A;   LEFT HEART CATH AND CORONARY ANGIOGRAPHY N/A 10/17/2019   Procedure: LEFT HEART CATH AND CORONARY ANGIOGRAPHY;  Surgeon: Sherren Mocha, MD;  Location: [place] CV LAB;  Service: Cardiovascular;  Laterality: N/A;   [place]  10/25/2019   Procedure: Median Sternotomy.;  Surgeon: [name], MD;  Location: [facility] OR;  Service: Open Heart Surgery;;   PLACEMENT OF IMPELLA LEFT VENTRICULAR ASSIST DEVICE  10/30/2019   Procedure: Placement Of Impella Left Ventricular Assist Device 5.5 USING 10MM HEMASHIELD  PLATINUM GRAFT;  Surgeon: Prescott Gum, Collier Salina, MD;  Location: [place];  Service: Open Heart Surgery;;  graft placed into aorta   REMOVAL OF IMPELLA LEFT VENTRICULAR ASSIST DEVICE N/A 11/03/2019   Procedure: REMOVAL OF IMPELLA 5.5 LEFT VENTRICULAR ASSIST DEVICE;  Surgeon: [name], MD;  Location: [place];  Service: Open Heart Surgery;  Laterality: N/A;   STERNAL CLOSURE N/A 10/30/2019   Procedure: Sternal Closure;  Surgeon: [name], MD;  Location: [place];  Service: Open Heart Surgery;  Laterality: N/A;   TEE WITHOUT CARDIOVERSION N/A 10/25/2019   Procedure: TRANSESOPHAGEAL ECHOCARDIOGRAM (TEE);  Surgeon: Prescott Gum, Collier Salina, MD;  Location: [place];  Service: Open Heart Surgery;  Laterality: N/A;   TEE WITHOUT CARDIOVERSION N/A 10/30/2019   Procedure: TRANSESOPHAGEAL ECHOCARDIOGRAM (TEE);  Surgeon: Prescott Gum, Collier Salina, MD;  Location: [place];  Service: Open Heart Surgery;  Laterality: N/A;   TEE WITHOUT CARDIOVERSION N/A 11/03/2019   Procedure: TRANSESOPHAGEAL ECHOCARDIOGRAM (TEE);  Surgeon: Prescott Gum, Collier Salina, MD;  Location: [place];  Service: Open Heart Surgery;  Laterality: N/A;     Home Meds: Prior to Admission medications   Medication Sig Start Date End Date Taking? Authorizing Provider  aspirin EC 81 MG tablet Take 1 tablet (81 mg total) by mouth daily. Swallow whole. 02/20/20  Yes Larey Dresser, MD  atorvastatin (LIPITOR) 40 MG tablet Take 1 tablet (40 mg total) by mouth daily. 02/02/20  Yes Larey Dresser, MD  carvedilol (COREG) 6.25 MG tablet Take 1 tablet (6.25 mg total) by mouth 2 (two) times daily with a meal. 02/20/20  Yes Larey Dresser, MD  cetirizine (ZYRTEC) 10 MG tablet Take 10 mg by mouth daily.   Yes [provider]  clopidogrel (PLAVIX) 75 MG tablet Take 1 tablet (75 mg total) by mouth daily. 02/02/20  Yes Larey Dresser, MD  gabapentin (NEURONTIN) 300 MG capsule Take 2 capsules (600 mg total) by mouth 3 (three) times daily. 02/07/20 03/18/20 Yes [name], MD  icosapent Ethyl (VASCEPA) 1 g capsule Take 2 capsules (2 g total) by mouth 2 (two) times daily. 01/12/20  Yes Larey Dresser, MD  oxyCODONE (OXY IR/ROXICODONE) 5 MG immediate release tablet Take 2 tablets (10 mg total) by mouth daily as needed for severe pain. 03/06/20 04/05/20 Yes [name], MD  pregabalin (LYRICA) 150 MG capsule Take 1 capsule (150 mg total) by mouth daily. Take at bedtime 02/07/20  Yes Prescott Gum, Collier Salina, MD  sacubitril-valsartan (ENTRESTO) 97-103 MG Take 1 tablet by mouth 2 (two) times daily. 03/14/20  Yes Larey Dresser, MD  spironolactone (ALDACTONE) 25 MG tablet Take 1 tablet (25 mg  total) by mouth daily. 03/14/20  Yes Larey Dresser, MD  thiamine 100 MG tablet Take 1 tablet (100 mg total) by mouth daily. 11/12/19  Yes Conte, Tessa N, PA-C  torsemide (DEMADEX) 20 MG tablet Take 1 tablet (20 mg total) by mouth every other day. 02/02/20  Yes Larey Dresser, MD  ketotifen (ZADITOR) 0.025 % ophthalmic solution Place 1 drop into both eyes 2 (two) times daily. Patient not taking: Reported on 03/18/2020 11/11/19   Elgie Collard, PA-C    Inpatient Medications: Scheduled Meds:  aspirin EC  81 mg Oral Daily   atorvastatin  40 mg Oral Daily   clopidogrel  75 mg Oral Daily   enoxaparin (LOVENOX) injection  40 mg Subcutaneous Q2200   ketotifen  1 drop Both Eyes BID   loratadine  10  mg Oral Daily   omega-3 acid ethyl esters  2 g Oral BID   sacubitril-valsartan  1 tablet Oral BID   sodium chloride flush  3 mL Intravenous Q12H   thiamine  100 mg Oral Daily   Continuous Infusions:  PRN Meds: acetaminophen **OR** acetaminophen, ondansetron **OR** ondansetron (ZOFRAN) IV, oxyCODONE  Allergies:  No Known Allergies  Social History:   Social History   Socioeconomic History   Marital status: Single    Spouse name: Not on file   Number of children: Not on file   Years of education: Not on file   Highest education level: Not on file  Occupational History   Not on file  Tobacco Use   Smoking status: Former Smoker    Quit date: 10/23/2019    Years since quitting: 0.4   Smokeless tobacco: Never Used  Substance and Sexual Activity   Alcohol use: Yes   Drug use: No   Sexual activity: Not on file  Other Topics Concern   Not on file  Social History Narrative   Not on file   Social Determinants of Health   Financial Resource Strain:    Difficulty of Paying Living Expenses: Not on file  Food Insecurity:    Worried About Estate manager/land agent of Food in the Last Year: Not on file   [employer] of Food in the Last Year: Not on file  Transportation Needs:     Lack of Transportation (Medical): Not on file   Lack of Transportation (Non-Medical): Not on file  Physical Activity:    Days of Exercise per Week: Not on file   Minutes of Exercise per Session: Not on file  Stress:    Feeling of Stress : Not on file  Social Connections:    Frequency of Communication with Friends and Family: Not on file   Frequency of Social Gatherings with Friends and Family: Not on file   Attends Religious Services: Not on file   Active Member of Clubs or Organizations: Not on file   Attends Archivist Meetings: Not on file   Marital Status: Not on file  Intimate Partner Violence:    Fear of Current or Ex-Partner: Not on file   Emotionally Abused: Not on file   Physically Abused: Not on file   Sexually Abused: Not on file     Family History:   Family History  Problem Relation Age of Onset   Heart attack Father     ROS:  Review of Systems  Constitutional: Positive for malaise/fatigue. Negative for chills, diaphoresis, fever and weight loss.  HENT: Negative for congestion.   Eyes: Negative for discharge and redness.  Respiratory: Negative for cough, sputum production, shortness of breath and wheezing.   Cardiovascular: Positive for chest pain and palpitations. Negative for orthopnea, claudication, leg swelling and PND.  Gastrointestinal: Negative for abdominal pain, heartburn, nausea and vomiting.  Musculoskeletal: Negative for falls and myalgias.  Skin: Negative for rash.  Neurological: Positive for dizziness and weakness. Negative for tingling, tremors, sensory change, speech change, focal weakness, seizures and loss of consciousness.  Endo/Heme/Allergies: Does not bruise/bleed easily.  Psychiatric/Behavioral: Negative for substance abuse. The patient is not nervous/anxious.   All other systems reviewed and are negative.     Physical Exam/Data:   Vitals:   03/19/20 0115 03/19/20 0117 03/19/20 0118 03/19/20 0755  BP: 120/83  119/85 128/87 104/77  Pulse: (!) 51 (!) 57 (!) 58 (!) 51  Resp:    16  Temp: 97.9  F (36.6 C)   (!) 97.4 F (36.3 C)  TempSrc: Oral   Oral  SpO2: 96% 99% 100% 99%  Weight:      Height:        Intake/Output Summary (Last 24 hours) at 03/19/2020 1114 Last data filed at 03/19/2020 1043 Gross per 24 hour  Intake 480 ml  Output --  Net 480 ml   Filed Weights   03/18/20 1306  Weight: 86.2 kg   Body mass index is 28.89 kg/m.   Physical Exam: General: Well developed, well nourished, in no acute distress. Head: Normocephalic, atraumatic, sclera non-icteric, no xanthomas, nares without discharge.  Neck: Negative for carotid bruits. JVD not elevated. Lungs: Clear bilaterally to auscultation without wheezes, rales, or rhonchi. Breathing is unlabored. Heart: RRR with S1 S2. No murmurs, rubs, or gallops appreciated. Abdomen: Soft, non-tender, non-distended with normoactive bowel sounds. No hepatomegaly. No rebound/guarding. No obvious abdominal masses. Msk:  Strength and tone appear normal for age. Extremities: No clubbing or cyanosis. No edema. Distal pedal pulses are 2+ and equal bilaterally. Neuro: Alert and oriented X 3. No facial asymmetry. No focal deficit. Moves all extremities spontaneously. Psych:  Responds to questions appropriately with a normal affect.   EKG:  The EKG was personally reviewed and demonstrates: sinus bradycardia, 58, RBBB, no acute ST-T changes Telemetry:  Telemetry was personally reviewed and demonstrates: sinus bradycardia with RBBB  Weights: Filed Weights   03/18/20 1306  Weight: 86.2 kg    Relevant CV Studies:  2D echo 02/20/2020: 1. Left ventricular ejection fraction, by estimation, is 55%. The left  ventricle has normal function. The left ventricle demonstrates regional  wall motion abnormalities with mild anteroseptal hypokinesis. Left  ventricular diastolic parameters are  consistent with Grade II diastolic dysfunction (pseudonormalization).   2. Right ventricular systolic function is normal. The right ventricular  size is normal. Tricuspid regurgitation signal is inadequate for assessing  PA pressure.  3. The mitral valve is normal in structure. No evidence of mitral valve  regurgitation. No evidence of mitral stenosis.  4. The aortic valve is tricuspid. Aortic valve regurgitation is not  visualized. No aortic stenosis is present.  5. The inferior vena cava is normal in size with greater than 50%  respiratory variability, suggesting right atrial pressure of 3 mmHg. __________  Limited Echo 11/02/19 EF 40-45, normal RVSF  Limited Echo 11/01/19 EF 30-35  Limited Echo 10/31/19 EF 25-30  Limited Echo 10/29/19 EF 20-25  Echo 10/26/19 EF < 20, Gr 2 DD, AS, lat, ant, inf, apical severe AK, severely reduced RVSF, mild RAE, mild MR  Limited Echo 10/25/19 EF 45, mild LVH, normal RVSF, mod pericardial effusion w/ tamponade physio  CTO PCI 10/25/19  Mid LAD lesion is 100% stenosed.  1st Mrg lesion is 100% stenosed.  Mid Cx to Dist Cx lesion is 50% stenosed.  Prox RCA to Mid RCA lesion is 35% stenosed.  A covered stent placed using a STENT PK PAPYRUS 2.5X20. and a 2.5 x 15 mm PK PAPYRUS with unsuccessful sealing of the perforation. 1. Unsuccessful CTO PCI of the LAD. Able to cross the lesion with a wire. With initial balloon inflation with a 2.0 mm balloon the patient developed a large perforation. We were unable to seal the perforation with prolonged balloon inflations and placement of Covered stents x 2. Balloon left inflated in the vessel to tamponade the perforation until definitive surgery done. Plan emergent surgery for oversewing of the perforation.  Cardiac catheterization 10/17/19 LAD mid 100 (  CTO) L-L collats; D1 mild dz LCx mid 50; OM1 100  RCA prox 35, mid 95 PCI: 4 x 18 mm Resolute Onyx DES to mRCA  Echocardiogram 10/17/19 EF 35-40, inf-lat + inf HK, Gr 2 DD, normal RVSF   Laboratory  Data:  Chemistry Recent Labs  Lab 03/18/20 1309 03/18/20 2315 03/19/20 0533  NA 135  --  137  K 3.7  --  3.6  CL 100  --  103  CO2 25  --  26  GLUCOSE 123*  --  99  BUN 14  --  12  CREATININE 1.29* 1.08 0.91  CALCIUM 9.1  --  8.7*  GFRNONAA >60 >60 >60  ANIONGAP 10  --  8    Recent Labs  Lab 03/18/20 1309 03/19/20 0533  PROT 7.3 7.1  ALBUMIN 4.5 4.3  AST 20 12*  ALT 23 19  ALKPHOS 76 73  BILITOT 0.7 0.5   Hematology Recent Labs  Lab 03/18/20 1309 03/18/20 2315 03/19/20 0533  WBC 6.4 6.2 6.3  RBC 3.90* 3.89* 4.01*  HGB 11.8* 11.7* 12.1*  HCT 34.9* 35.3* 36.3*  MCV 89.5 90.7 90.5  MCH 30.3 30.1 30.2  MCHC 33.8 33.1 33.3  RDW 13.3 13.2 13.2  PLT 238 233 228   Cardiac EnzymesNo results for input(s): TROPONINI in the last 168 hours. No results for input(s): TROPIPOC in the last 168 hours.  BNPNo results for input(s): BNP, PROBNP in the last 168 hours.  DDimer No results for input(s): DDIMER in the last 168 hours.  Radiology/Studies:  DG Chest 2 View  Result Date: 03/18/2020 IMPRESSION: No active cardiopulmonary disease. Electronically Signed   By: Franchot Gallo M.D.   On: 03/18/2020 13:56   CT Angio Chest PE W/Cm &/Or Wo Cm  Result Date: 03/18/2020 IMPRESSION: 1. Focal 1.7 cm outpouching of the distal ascending thoracic aorta is suspicious for a pseudoaneurysm related to ECMO catheter placement. Surgical consultation is recommended. 2. No evidence for an acute pulmonary embolism. Electronically Signed   By: Constance Holster M.D.   On: 03/18/2020 17:55    Assessment and Plan:   1. Presyncope: -Uncertain etiology, cannot exclude potential ventricular arrhythmia versus orthostasis with dehydration with mild AKI noted upon admission -No evidence of ACS by HS-Tn -CTA chest negative for PE -Monitor on telemetry  -Will need Zio AT at discharge  -Check orthostatics  2. Distal ascending thoracic aortic pseudoaneurysm: -Case discussed between Drs. End and  Prescott Gum with the above felt to be related to stalk from prior Impella without need for further evaluation/intervention at this time  3. CAD involving the native coronary arteries with recent NSTEMI with LAD perforation, cardaic tamponade, and cardiac arrest with elevated HS-Tn and ongoing chest wall discomfort: -Minimally elevated HS-Tn not consistent with ACS -No indication for heparin gtt at this time -Check limited echo as below to evaluate LVSF and wall motion  -Continue ASA and Plavix -Lipitor  -He does have ongoing chest wall pain which has been long standing dating back to his surgery and is consistent with MSK etiology  -PTA oxycodone prn  4. HFrEF secondary to ICM/myocardial stunning: -He appears euvolemic and well compensated with NYHA class I symptoms  -He has subsequently had recovery of EF -Check limited echo to evaluate for preserved LVSF and wall motion -Hold Coreg this morning with noted bradycardic rates -Continue Entresto -Not requiring standing diuretic  -With recovery of EF, no indication for ICD at this time, unless significant malignant arrhythmia is noted on  telemetry or Zio patch  5. History of right atrial thrombus: -Related to venous cannulation for ECMO -Most recent echo from 02/20/2020 without evidence of thrombus -Coumadin stopped 02/20/2020  6. History of EtOH/tobacco use: -Abstinent   7. Anemia: -Stable  8. Abnormal TSH: -Likely subclinical  -Add free T4/T3  9. Hypertriglyceridemia: - On Vascepa as an outpatient    For questions or updates, please contact [place] Please consult [email or web address] for contact info under Cardiology/STEMI.   Signed, Christell Faith, PA-C [place] Pager: [phone] 03/19/2020, 11:14 AM

## 2020-03-19 NOTE — ED Notes (Signed)
Attempted to call report

## 2020-03-19 NOTE — Progress Notes (Signed)
*  PRELIMINARY RESULTS* Echocardiogram 2D Echocardiogram has been performed.  William Crawford 03/19/2020, 10:52 AM

## 2020-03-19 NOTE — Progress Notes (Addendum)
PROGRESS NOTE  ZYMIR NAPOLI CBU:[id number] DOB: 1970-06-12 DOA: 03/18/2020 PCP: Patient, No Pcp Per   LOS: 0 days   Brief narrative: As per HPI, KEAUNDRE THELIN is a 49 y.o. male with medical history significant of coronary artery disease, GERD, tobacco abuse, recent LAD perforation and tamponade with VF arrest and ischemic cardiomyopathy leading to ECMO, has right atrial thrombus presented to hospital this time with chest pain and near syncopal episode.  Patient had angioplasty back in June when he had LAD perforation which was associated tamponade that required ECMO followed by Impella.  He had recent echocardiogram showing normal EF of 55%.  He has been doing fine since then with no new issues except for severe sternal knifelike chest wall pain.  In the ED,  CT angiogram of the chest showed a focal 1.7 cm outpouching of the distal ascending aorta suspicious for pseudoaneurysm probably related to ECMO catheter placement with surgical consultation recommended.  Chest x-ray showed no acute cardiopulmonary findings.  He also had mildly elevated flat troponins.  Cardiology was consulted and the patient was admitted to hospital for further work-up and treatment.  Assessment/Plan:  Principal Problem:   Syncope Active Problems:   Tobacco use   Ischemic cardiomyopathy   Hypertension   Hyperlipemia   Alcohol abuse   CAD (coronary artery disease)   Atrial thrombus   Personal history of ECMO   Chest pain and presyncope:  Complains of severe chest pain.  States that it has been going on ever since he had a surgery.  He was on oxycodone for this pain.  Describes it as severe starting from the throat all the way to the epigastric region.  Reproducible tenderness on palpation as well.  It appears more of the musculoskeletal to me than ischemic.  He recently ran out of gabapentin.  Will start on gabapentin 3 times a day.  Continue oxycodone the patient is on from home.  Patient however had   presyncopal symptoms.  Cardiology is on board.  Patient does have complex cardiac history and intervention recently so cardiology has requested to monitor the patient overnight without Coreg.  Might need Zio patch on discharge.  Cardiac enzymes were mildly elevated at 30-29.  EKG unremarkable except for sinus bradycardia..  Patient was on heparin drip which will be discontinued at this time.  Continue aspirin, Lipitor, Plavix, Entresto and spironolactone.  History of ischemic cardiomyopathy, multivessel coronary artery disease status post PCI to RCA, PCI to LAD was complicated by perforation requiring emergent surgical intervention including ECMO and Impella for severe heart failure.  Has improved at this time.  Cardiology on board.  Patient has complained of some chest pain since the surgery.  Hold beta-blockers.  Continue Entresto and spironolactone.  Patient does not appear to be decompensated.  2D echocardiogram performed today shows LV ejection fraction at 50 to 55% with regional wall motion abnormalities of the left ventricle wall.   Essential hypertension:  Resumed Entresto spironolactone.  Hold off with Coreg as per cardiology.  Hyperlipidemia: Continue statin.  On Lipitor 40 daily.   history of alcohol abuse: No no recent alcohol intake.  Continue to monitor.  Watch for withdrawal symptoms.  pseudoaneurysm of the aorta:  Described on the CT scan.  CT surgery was notified by cardiology and there is no need for further work-up.  This is thought to be graft used for Impella.   Tobacco abuse:  On nicotine patch.   DVT prophylaxis: enoxaparin (LOVENOX) injection 40 mg  Start: 03/18/20 2230    Code Status: Full code  Family Communication: None  Status is: Observation  The patient will require care spanning > 2 midnights and should be moved to inpatient because: IV treatments appropriate due to intensity of illness or inability to take PO, Inpatient level of care appropriate due to  severity of illness and Further arrhythmia monitoring, cardiology follow-up  Dispo: The patient is from: Home              Anticipated d/c is to: Home              Anticipated d/c date is: 1-2 days              Patient currently is not medically stable to d/c.   Consultants:  Cardiology  Procedures:  2D echocardiogram  Antibiotics:  . None  Anti-infectives (From admission, onward)   None     Subjective: Today, patient was seen and examined at bedside.  Denies dyspnea, shortness of breath but complains of a sharp chest pain.  Denies diaphoresis, nausea.  Denies leg swelling.  Denies fever chills or cough.  Objective: Vitals:   03/19/20 0755 03/19/20 1117  BP: 104/77 123/81  Pulse: (!) 51 (!) 55  Resp: 16 16  Temp: (!) 97.4 F (36.3 C)   SpO2: 99% 100%    Intake/Output Summary (Last 24 hours) at 03/19/2020 1316 Last data filed at 03/19/2020 1043 Gross per 24 hour  Intake 480 ml  Output --  Net 480 ml   Filed Weights   03/18/20 1306  Weight: 86.2 kg   Body mass index is 28.89 kg/m.   Physical Exam:  GENERAL: Patient is alert awake and oriented. Not in obvious distress. HENT: No scleral pallor or icterus. Pupils equally reactive to light. Oral mucosa is moist NECK: is supple, no gross swelling noted. CHEST: Clear to auscultation. No crackles or wheezes.  Diminished breath sounds bilaterally. CVS: S1 and S2 heard, no murmur. Regular rate and rhythm.  Midsternal scar from recent CABG. ABDOMEN: Soft, non-tender, bowel sounds are present. EXTREMITIES: No edema. CNS: Cranial nerves are intact. No focal motor deficits. SKIN: warm and dry without rashes.  Data Review: I have personally reviewed the following laboratory data and studies,  CBC: Recent Labs  Lab 03/18/20 1309 03/18/20 2315 03/19/20 0533  WBC 6.4 6.2 6.3  HGB 11.8* 11.7* 12.1*  HCT 34.9* 35.3* 36.3*  MCV 89.5 90.7 90.5  PLT 238 233 591   Basic Metabolic Panel: Recent Labs  Lab  03/18/20 1309 03/18/20 1314 03/18/20 2315 03/19/20 0533  NA 135  --   --  137  K 3.7  --   --  3.6  CL 100  --   --  103  CO2 25  --   --  26  GLUCOSE 123*  --   --  99  BUN 14  --   --  12  CREATININE 1.29*  --  1.08 0.91  CALCIUM 9.1  --   --  8.7*  MG  --  2.3  --   --    Liver Function Tests: Recent Labs  Lab 03/18/20 1309 03/19/20 0533  AST 20 12*  ALT 23 19  ALKPHOS 76 73  BILITOT 0.7 0.5  PROT 7.3 7.1  ALBUMIN 4.5 4.3   No results for input(s): LIPASE, AMYLASE in the last 168 hours. No results for input(s): AMMONIA in the last 168 hours. Cardiac Enzymes: No results for input(s): CKTOTAL, CKMB, CKMBINDEX,  TROPONINI in the last 168 hours. BNP (last 3 results) Recent Labs    10/17/19 0605  BNP 608.8*    ProBNP (last 3 results) No results for input(s): PROBNP in the last 8760 hours.  CBG: Recent Labs  Lab 03/19/20 0811  GLUCAP 75   Recent Results (from the past 240 hour(s))  Respiratory Panel by RT PCR (Flu A&B, Covid) - Nasopharyngeal Swab     Status: None   Collection Time: 03/18/20  5:11 PM   Specimen: Nasopharyngeal Swab  Result Value Ref Range Status   SARS Coronavirus 2 by RT PCR NEGATIVE NEGATIVE Final    Comment: (NOTE) SARS-CoV-2 target nucleic acids are NOT DETECTED.  The SARS-CoV-2 RNA is generally detectable in upper respiratoy specimens during the acute phase of infection. The lowest concentration of SARS-CoV-2 viral copies this assay can detect is 131 copies/mL. A negative result does not preclude SARS-Cov-2 infection and should not be used as the sole basis for treatment or other patient management decisions. A negative result may occur with  improper specimen collection/handling, submission of specimen other than nasopharyngeal swab, presence of viral mutation(s) within the areas targeted by this assay, and inadequate number of viral copies (<131 copies/mL). A negative result must be combined with clinical observations, patient  history, and epidemiological information. The expected result is Negative.  Fact Sheet for Patients:  PinkCheek.be  Fact Sheet for Healthcare Providers:  GravelBags.it  This test is no t yet approved or cleared by the [place] FDA and  has been authorized for detection and/or diagnosis of SARS-CoV-2 by FDA under an Emergency Use Authorization (EUA). This EUA will remain  in effect (meaning this test can be used) for the duration of the COVID-19 declaration under Section 564(b)(1) of the Act, 21 U.S.C. section 360bbb-3(b)(1), unless the authorization is terminated or revoked sooner.     Influenza A by PCR NEGATIVE NEGATIVE Final   Influenza B by PCR NEGATIVE NEGATIVE Final    Comment: (NOTE) The Xpert Xpress SARS-CoV-2/FLU/RSV assay is intended as an aid in  the diagnosis of influenza from Nasopharyngeal swab specimens and  should not be used as a sole basis for treatment. Nasal washings and  aspirates are unacceptable for Xpert Xpress SARS-CoV-2/FLU/RSV  testing.  Fact Sheet for Patients: PinkCheek.be  Fact Sheet for Healthcare Providers: GravelBags.it  This test is not yet approved or cleared by the [place] FDA and  has been authorized for detection and/or diagnosis of SARS-CoV-2 by  FDA under an Emergency Use Authorization (EUA). This EUA will remain  in effect (meaning this test can be used) for the duration of the  Covid-19 declaration under Section 564(b)(1) of the Act, 21  U.S.C. section 360bbb-3(b)(1), unless the authorization is  terminated or revoked. Performed at [facility], [address] [place], [place] [address]      Studies: DG Chest 2 View  Result Date: 03/18/2020 CLINICAL DATA:  Chest pain EXAM: CHEST - 2 VIEW COMPARISON:  12/13/2019 FINDINGS: The heart size and mediastinal contours are within normal limits. Both  lungs are clear. The visualized skeletal structures are unremarkable. Interval clearing right upper lobe atelectasis. IMPRESSION: No active cardiopulmonary disease. Electronically Signed   By: Franchot Gallo M.D.   On: 03/18/2020 13:56   CT Angio Chest PE W/Cm &/Or Wo Cm  Result Date: 03/18/2020 CLINICAL DATA:  Substernal chest pain. EXAM: CT ANGIOGRAPHY CHEST WITH CONTRAST TECHNIQUE: Multidetector CT imaging of the chest was performed using the standard protocol during bolus administration  of intravenous contrast. Multiplanar CT image reconstructions and MIPs were obtained to evaluate the vascular anatomy. CONTRAST:  33m OMNIPAQUE IOHEXOL 350 MG/ML SOLN COMPARISON:  None. FINDINGS: Cardiovascular: There is no evidence for a thoracic aortic aneurysm. The patient is status post prior median sternotomy. There is no large centrally located pulmonary embolism. Detection of smaller pulmonary emboli is limited by respiratory motion artifact. There is a focal outpouching arising from the distal ascending aorta measuring approximately 1.7 cm. This outpouching is filled with contrast and connects to a tract coursing superiorly towards the patient's thoracic inlet. This is likely related to ECMO. There is moderate narrowing of the proximal left carotid artery. Mediastinum/Nodes: -- No mediastinal lymphadenopathy. -- No hilar lymphadenopathy. -- No axillary lymphadenopathy. -- No supraclavicular lymphadenopathy. -- Normal thyroid gland where visualized. -  Unremarkable esophagus. Lungs/Pleura: Airways are patent. No pleural effusion, lobar consolidation, pneumothorax or pulmonary infarction. Upper Abdomen: Contrast bolus timing is not optimized for evaluation of the abdominal organs. The visualized portions of the organs of the upper abdomen are normal. Musculoskeletal: No chest wall abnormality. No bony spinal canal stenosis. Review of the MIP images confirms the above findings. IMPRESSION: 1. Focal 1.7 cm outpouching  of the distal ascending thoracic aorta is suspicious for a pseudoaneurysm related to ECMO catheter placement. Surgical consultation is recommended. 2. No evidence for an acute pulmonary embolism. Electronically Signed   By: CConstance HolsterM.D.   On: 03/18/2020 17:55   ECHOCARDIOGRAM LIMITED  Result Date: 03/19/2020    ECHOCARDIOGRAM LIMITED REPORT   Patient Name:   JADAEL CULBREATHDate of Exam: 03/19/2020 Medical Rec #:  [id number]        Height:       68.0 in Accession #:    [id number]       Weight:       190.0 lb Date of Birth:  [birth year]        BSA:          2.000 m Patient Age:    89years          BP:           104/77 mmHg Patient Gender: M                 HR:           53 bpm. Exam Location:  [facility] Procedure: Limited Echo, Limited Color Doppler, Cardiac Doppler and Intracardiac            Opacification Agent Indications:     I50.9 Congestive Heart Failure  History:         Patient has prior history of Echocardiogram examinations, most                  recent 02/20/2020. Risk Factors:Former Smoker.  Sonographer:     JCharmayne SheerRDCS (AE) Referring Phys:  [id number] MuDiagnosing Phys: CNelva BushMD  Sonographer Comments: No subcostal window. IMPRESSIONS  1. Left ventricular ejection fraction, by estimation, is 50 to 55%. The left ventricle has low normal function. The left ventricle demonstrates regional wall motion abnormalities (see scoring diagram/findings for description). There is mild hypokinesis of the left ventricular, mid-apical anteroseptal wall.  2. The right ventricular size is normal.  3. The mitral valve is normal in structure. Trivial mitral valve regurgitation.  4. The inferior vena cava is normal in size with <50% respiratory variability, suggesting right atrial pressure of 8 mmHg. FINDINGS  Left Ventricle: Left  ventricular ejection fraction, by estimation, is 50 to 55%. The left ventricle has low normal function. The left ventricle demonstrates regional wall motion  abnormalities. Mild hypokinesis of the left ventricular, mid-apical anteroseptal wall. Definity contrast agent was given IV to delineate the left ventricular endocardial borders. The left ventricular internal cavity size was normal in size. There is no left ventricular hypertrophy. Right Ventricle: The right ventricular size is normal. No increase in right ventricular wall thickness. Pericardium: There is no evidence of pericardial effusion. Mitral Valve: The mitral valve is normal in structure. Trivial mitral valve regurgitation. Tricuspid Valve: The tricuspid valve is normal in structure. Tricuspid valve regurgitation is trivial. Pulmonic Valve: The pulmonic valve was grossly normal. Pulmonary Artery: The pulmonary artery is of normal size. Venous: The inferior vena cava is normal in size with less than 50% respiratory variability, suggesting right atrial pressure of 8 mmHg. LEFT VENTRICLE PLAX 2D LVIDd:         4.72 cm LVIDs:         3.42 cm LV PW:         0.97 cm LV IVS:        0.82 cm  LEFT ATRIUM         Index LA diam:    3.90 cm 1.95 cm/m Nelva Bush MD Electronically signed by Nelva Bush MD Signature Date/Time: 03/19/2020/11:38:04 AM    Final       Flora Lipps, MD  [facility] 03/19/2020

## 2020-03-20 ENCOUNTER — Other Ambulatory Visit: Payer: Self-pay

## 2020-03-20 ENCOUNTER — Ambulatory Visit (INDEPENDENT_AMBULATORY_CARE_PROVIDER_SITE_OTHER): Payer: BLUE CROSS/BLUE SHIELD

## 2020-03-20 ENCOUNTER — Telehealth: Payer: Self-pay | Admitting: Physician Assistant

## 2020-03-20 DIAGNOSIS — R55 Syncope and collapse: Secondary | ICD-10-CM

## 2020-03-20 DIAGNOSIS — I2510 Atherosclerotic heart disease of native coronary artery without angina pectoris: Secondary | ICD-10-CM | POA: Diagnosis not present

## 2020-03-20 LAB — GLUCOSE, CAPILLARY: Glucose-Capillary: 88 mg/dL (ref 70–99)

## 2020-03-20 MED ORDER — CARVEDILOL 6.25 MG PO TABS: ORAL_TABLET | ORAL | 3 refills | Status: DC

## 2020-03-20 MED ORDER — OXYCODONE HCL 10 MG PO TABS
10.0000 mg | ORAL_TABLET | Freq: Two times a day (BID) | ORAL | 0 refills | Status: DC | PRN
Start: 2020-03-20 — End: 2020-05-08

## 2020-03-20 MED ORDER — GABAPENTIN 300 MG PO CAPS: 600.0000 mg | ORAL_CAPSULE | Freq: Three times a day (TID) | ORAL | 2 refills | Status: DC

## 2020-03-20 NOTE — Discharge Summary (Signed)
Physician Discharge Summary   William Crawford  male DOB: March 04, 1971  RSW:[id number]  PCP: Patient, No Pcp Per  Admit date: 03/18/2020 Discharge date: 03/20/2020  Admitted From: home Disposition:  home CODE STATUS: Full code   Hospital Course:  For full details, please see H&P, progress notes, consult notes and ancillary notes.  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   Patient had angioplasty back in June when he had LAD perforation which was associated tamponade that required ECMO followed by Impella. He had recent echocardiogram showing normal EF of 55%. He has been doing fine since then with no new issues except for severe sternal knifelike chest wall pain.  In the ED,CT angiogram of the chest showed a focal 1.7 cm outpouching of the distal ascending aorta suspicious for pseudoaneurysm probably related to ECMO catheter placement with surgical consultation recommended. Chest x-ray showed no acute cardiopulmonary findings.  He also had mildly elevated flat troponins.  Cardiology was consulted and the patient was admitted to hospital for further work-up and treatment.  Chest pain and presyncope  Complained of severe chest pain.  He was on oxycodone for this pain.  Describes it as severe starting from the throat all the way to the epigastric region.  Reproducible tenderness on palpation as well.  It appeared more of the musculoskeletal than ischemic.  Cardiac enzymes were mildly elevated at 30-29.  EKG unremarkable except for sinus bradycardia.  no significant arrhythmias on telemetry, orthostatic vitals unrevealing.  Cardiology consulted, recommended holding coreg.  Continued aspirin, Lipitor,  Plavix, Entresto and spironolactone.  Zio cardiac monitor was applied prior to discharge.  Pt will follow up with cardiology outpatient.  He recently ran out of gabapentin, which was continued and prescribed.  Short course of oxycodone Rx provided by cardiology at discharge.  History of ischemic cardiomyopathy multivessel coronary artery disease status post PCI to RCA, and LAD  Patient does not appear to be decompensated.  2D echocardiogram showed LV ejection fraction at 50 to 55% with regional wall motion abnormalities of the left ventricle wall.  Cardiology consulted, recommendations as above.  Essential hypertension Continued Entresto spironolactone.  Hold off with Coreg as per cardiology.  Hyperlipidemia Continued statin.    history of alcohol abuse: Per pt, no recent alcohol intake. No withdrawal symptoms.  pseudoaneurysm of the aorta:  Described on the CT scan.  CT surgery was notified by cardiology and there is no need for further work-up.  This is thought to be graft used for Impella.   Tobacco abuse nicotine patch.   Discharge Diagnoses:  Principal Problem:   Syncope Active Problems:   Tobacco use   Ischemic cardiomyopathy   Hypertension   Hyperlipemia   Alcohol abuse   CAD (coronary artery disease)   Atrial thrombus   Personal history of ECMO   Near syncope    Discharge Instructions:  Allergies as of 03/20/2020   No Known Allergies     Medication List    STOP taking these medications   ketotifen 0.025 % ophthalmic solution Commonly known as: ZADITOR     TAKE these medications   aspirin EC 81 MG tablet Take 1 tablet (81 mg total) by mouth daily. Swallow whole.   atorvastatin 40 MG tablet Commonly known as: LIPITOR  Take 1 tablet (40 mg total) by mouth daily.   carvedilol 6.25 MG tablet Commonly known as: Coreg Hold until followup with outpatient cardiology due to low heart rate. What changed:   how much to take  how to take  this  when to take this  additional instructions   cetirizine 10 MG tablet Commonly known as: ZYRTEC Take 10 mg by mouth daily.   clopidogrel 75 MG tablet Commonly known as: PLAVIX Take 1 tablet (75 mg total) by mouth daily.   gabapentin 300 MG capsule Commonly known as: Neurontin Take 2 capsules (600 mg total) by mouth 3 (three) times daily.   icosapent Ethyl 1 g capsule Commonly known as: Vascepa Take 2 capsules (2 g total) by mouth 2 (two) times daily.   Oxycodone HCl 10 MG Tabs Take 1 tablet (10 mg total) by mouth 2 (two) times daily as needed for severe pain. What changed:   medication strength  when to take this   pregabalin 150 MG capsule Commonly known as: Lyrica Take 1 capsule (150 mg total) by mouth daily. Take at bedtime   sacubitril-valsartan 97-103 MG Commonly known as: ENTRESTO Take 1 tablet by mouth 2 (two) times daily.   spironolactone 25 MG tablet Commonly known as: ALDACTONE Take 1 tablet (25 mg total) by mouth daily.   thiamine 100 MG tablet Take 1 tablet (100 mg total) by mouth daily.   torsemide 20 MG tablet Commonly known as: DEMADEX Take 1 tablet (20 mg total) by mouth every other day.        Follow-up Information    Sherren Mocha, MD. Schedule an appointment as soon as possible for a visit in 1 week(s).   Specialty: Cardiology Contact information: [address] [place] [address] [phone]        Larey Dresser, MD .   Specialty: Cardiology Contact information: [phone] [address] [place] [address] [phone]               No Known Allergies   The results of significant diagnostics from this hospitalization (including imaging, microbiology, ancillary and laboratory) are listed below for reference.   Consultations:   Procedures/Studies: DG Chest 2 View  Result Date: 03/18/2020 CLINICAL DATA:  Chest pain EXAM: CHEST - 2 VIEW COMPARISON:  12/13/2019 FINDINGS: The  heart size and mediastinal contours are within normal limits. Both lungs are clear. The visualized skeletal structures are unremarkable. Interval clearing right upper lobe atelectasis. IMPRESSION: No active cardiopulmonary disease. Electronically Signed   By: Franchot Gallo M.D.   On: 03/18/2020 13:56   CT Angio Chest PE W/Cm &/Or Wo Cm  Result Date: 03/18/2020 CLINICAL DATA:  Substernal chest pain. EXAM: CT ANGIOGRAPHY CHEST WITH CONTRAST TECHNIQUE: Multidetector CT imaging of the chest was performed using the standard protocol during bolus administration of intravenous contrast. Multiplanar CT image reconstructions and MIPs were obtained to evaluate the vascular anatomy. CONTRAST:  37m OMNIPAQUE IOHEXOL 350 MG/ML SOLN COMPARISON:  None. FINDINGS: Cardiovascular: There is no evidence for a thoracic aortic aneurysm. The patient is status post prior median sternotomy. There is no large centrally located pulmonary embolism. Detection of smaller pulmonary emboli is limited by respiratory motion artifact. There is a focal outpouching arising from the distal ascending aorta measuring approximately 1.7 cm. This outpouching is filled with contrast and connects to a tract coursing superiorly towards the patient's thoracic inlet. This is likely related to ECMO. There is moderate narrowing of the proximal left  carotid artery. Mediastinum/Nodes: -- No mediastinal lymphadenopathy. -- No hilar lymphadenopathy. -- No axillary lymphadenopathy. -- No supraclavicular lymphadenopathy. -- Normal thyroid gland where visualized. -  Unremarkable esophagus. Lungs/Pleura: Airways are patent. No pleural effusion, lobar consolidation, pneumothorax or pulmonary infarction. Upper Abdomen: Contrast bolus timing is not optimized for evaluation of the abdominal organs. The visualized portions of the organs of the upper abdomen are normal. Musculoskeletal: No chest wall abnormality. No bony spinal canal stenosis. Review of the MIP images  confirms the above findings. IMPRESSION: 1. Focal 1.7 cm outpouching of the distal ascending thoracic aorta is suspicious for a pseudoaneurysm related to ECMO catheter placement. Surgical consultation is recommended. 2. No evidence for an acute pulmonary embolism. Electronically Signed   By: Constance Holster M.D.   On: 03/18/2020 17:55   ECHOCARDIOGRAM COMPLETE  Result Date: 02/20/2020    ECHOCARDIOGRAM REPORT   Patient Name:   William Crawford Date of Exam: 02/20/2020 Medical Rec #:  [id number]         Height:       68.5 in Accession #:    [id number]        Weight:       177.0 lb Date of Birth:  12-28-1970         BSA:          1.951 m Patient Age:    48 years          BP:           118/60 mmHg Patient Gender: M                 HR:           74 bpm. Exam Location:  Outpatient Procedure: 2D Echo Indications:    CHF 428  History:        Patient has prior history of Echocardiogram examinations, most                 recent 11/03/2019. Hx of LVAD. ETOH Abuse. Former [employer] developer.  Sonographer:    [name] RDCS (AE) Referring Phys: [id number] DANIEL R BENSIMHON IMPRESSIONS  1. Left ventricular ejection fraction, by estimation, is 55%. The left ventricle has normal function. The left ventricle demonstrates regional wall motion abnormalities with mild anteroseptal hypokinesis. Left ventricular diastolic parameters are consistent with Grade II diastolic dysfunction (pseudonormalization).  2. Right ventricular systolic function is normal. The right ventricular size is normal. Tricuspid regurgitation signal is inadequate for assessing PA pressure.  3. The mitral valve is normal in structure. No evidence of mitral valve regurgitation. No evidence of mitral stenosis.  4. The aortic valve is tricuspid. Aortic valve regurgitation is not visualized. No aortic stenosis is present.  5. The inferior vena cava is normal in size with greater than 50% respiratory variability, suggesting right atrial pressure of 3 mmHg. FINDINGS  Left  Ventricle: Left ventricular ejection fraction, by estimation, is 55%. The left ventricle has normal function. The left ventricle demonstrates regional wall motion abnormalities. The left ventricular internal cavity size was normal in size. There is  no left ventricular hypertrophy. Left ventricular diastolic parameters are consistent with Grade II diastolic dysfunction (pseudonormalization). Right Ventricle: The right ventricular size is normal. No increase in right ventricular wall thickness. Right ventricular systolic function is normal. Tricuspid regurgitation signal is inadequate for assessing PA pressure. Left Atrium: Left atrial size was normal in size. Right Atrium: Right atrial size was normal in size. Pericardium: There is no evidence of pericardial effusion.  Mitral Valve: The mitral valve is normal in structure. No evidence of mitral valve regurgitation. No evidence of mitral valve stenosis. Tricuspid Valve: The tricuspid valve is normal in structure. Tricuspid valve regurgitation is not demonstrated. Aortic Valve: The aortic valve is tricuspid. Aortic valve regurgitation is not visualized. No aortic stenosis is present. Pulmonic Valve: The pulmonic valve was normal in structure. Pulmonic valve regurgitation is not visualized. Aorta: The aortic root is normal in size and structure. Venous: The inferior vena cava is normal in size with greater than 50% respiratory variability, suggesting right atrial pressure of 3 mmHg. IAS/Shunts: No atrial level shunt detected by color flow Doppler.  LEFT VENTRICLE PLAX 2D LVIDd:         4.90 cm  Diastology LVIDs:         3.10 cm  LV e' medial:    7.94 cm/s LV PW:         0.90 cm  LV E/e' medial:  9.0 LV IVS:        0.90 cm  LV e' lateral:   11.60 cm/s LVOT diam:     2.20 cm  LV E/e' lateral: 6.1 LV SV:         61 LV SV Index:   31 LVOT Area:     3.80 cm  RIGHT VENTRICLE RV S prime:     10.90 cm/s TAPSE (M-mode): 1.2 cm LEFT ATRIUM             Index       RIGHT ATRIUM           Index LA diam:        3.20 cm 1.64 cm/m  RA Area:     8.47 cm LA Vol (A2C):   32.1 ml 16.45 ml/m RA Volume:   13.60 ml 6.97 ml/m LA Vol (A4C):   23.4 ml 11.99 ml/m LA Biplane Vol: 28.8 ml 14.76 ml/m  AORTIC VALVE LVOT Vmax:   86.80 cm/s LVOT Vmean:  61.200 cm/s LVOT VTI:    0.161 m  AORTA Ao Root diam: 2.80 cm MITRAL VALVE MV Area (PHT): 3.42 cm    SHUNTS MV Decel Time: 222 msec    Systemic VTI:  0.16 m MV E velocity: 71.10 cm/s  Systemic Diam: 2.20 cm MV A velocity: 47.10 cm/s MV E/A ratio:  1.51 Loralie Champagne MD Electronically signed by Loralie Champagne MD Signature Date/Time: 02/20/2020/3:19:06 PM    Final    ECHOCARDIOGRAM LIMITED  Result Date: 03/19/2020    ECHOCARDIOGRAM LIMITED REPORT   Patient Name:   William Crawford Date of Exam: 03/19/2020 Medical Rec #:  [id number]         Height:       68.0 in Accession #:    [id number]        Weight:       190.0 lb Date of Birth:  Dec 21, 1970         BSA:          2.000 m Patient Age:    67 years          BP:           104/77 mmHg Patient Gender: M                 HR:           53 bpm. Exam Location:  [facility] Procedure: Limited Echo, Limited Color Doppler, Cardiac Doppler and Intracardiac  Opacification Agent Indications:     I50.9 Congestive Heart Failure  History:         Patient has prior history of Echocardiogram examinations, most                  recent 02/20/2020. Risk Factors:Former Smoker.  Sonographer:     Charmayne Sheer RDCS (AE) Referring Phys:  [id number] [name] Diagnosing Phys: Nelva Bush MD  Sonographer Comments: No subcostal window. IMPRESSIONS  1. Left ventricular ejection fraction, by estimation, is 50 to 55%. The left ventricle has low normal function. The left ventricle demonstrates regional wall motion abnormalities (see scoring diagram/findings for description). There is mild hypokinesis of the left ventricular, mid-apical anteroseptal wall.  2. The right ventricular size is normal.  3. The mitral valve is normal in structure.  Trivial mitral valve regurgitation.  4. The inferior vena cava is normal in size with <50% respiratory variability, suggesting right atrial pressure of 8 mmHg. FINDINGS  Left Ventricle: Left ventricular ejection fraction, by estimation, is 50 to 55%. The left ventricle has low normal function. The left ventricle demonstrates regional wall motion abnormalities. Mild hypokinesis of the left ventricular, mid-apical anteroseptal wall. Definity contrast agent was given IV to delineate the left ventricular endocardial borders. The left ventricular internal cavity size was normal in size. There is no left ventricular hypertrophy. Right Ventricle: The right ventricular size is normal. No increase in right ventricular wall thickness. Pericardium: There is no evidence of pericardial effusion. Mitral Valve: The mitral valve is normal in structure. Trivial mitral valve regurgitation. Tricuspid Valve: The tricuspid valve is normal in structure. Tricuspid valve regurgitation is trivial. Pulmonic Valve: The pulmonic valve was grossly normal. Pulmonary Artery: The pulmonary artery is of normal size. Venous: The inferior vena cava is normal in size with less than 50% respiratory variability, suggesting right atrial pressure of 8 mmHg. LEFT VENTRICLE PLAX 2D LVIDd:         4.72 cm LVIDs:         3.42 cm LV PW:         0.97 cm LV IVS:        0.82 cm  LEFT ATRIUM         Index LA diam:    3.90 cm 1.95 cm/m Nelva Bush MD Electronically signed by Nelva Bush MD Signature Date/Time: 03/19/2020/11:38:04 AM    Final       Labs: BNP (last 3 results) Recent Labs    10/17/19 0605  BNP 591.6*   Basic Metabolic Panel: Recent Labs  Lab 03/18/20 1309 03/18/20 1314 03/18/20 2315 03/19/20 0533  NA 135  --   --  137  K 3.7  --   --  3.6  CL 100  --   --  103  CO2 25  --   --  26  GLUCOSE 123*  --   --  99  BUN 14  --   --  12  CREATININE 1.29*  --  1.08 0.91  CALCIUM 9.1  --   --  8.7*  MG  --  2.3  --   --     Liver Function Tests: Recent Labs  Lab 03/18/20 1309 03/19/20 0533  AST 20 12*  ALT 23 19  ALKPHOS 76 73  BILITOT 0.7 0.5  PROT 7.3 7.1  ALBUMIN 4.5 4.3   No results for input(s): LIPASE, AMYLASE in the last 168 hours. No results for input(s): AMMONIA in the last 168 hours. CBC: Recent  Labs  Lab 03/18/20 1309 03/18/20 2315 03/19/20 0533  WBC 6.4 6.2 6.3  HGB 11.8* 11.7* 12.1*  HCT 34.9* 35.3* 36.3*  MCV 89.5 90.7 90.5  PLT 238 233 228   Cardiac Enzymes: No results for input(s): CKTOTAL, CKMB, CKMBINDEX, TROPONINI in the last 168 hours. BNP: Invalid input(s): POCBNP CBG: Recent Labs  Lab 03/19/20 0811 03/20/20 0654  GLUCAP 75 88   D-Dimer No results for input(s): DDIMER in the last 72 hours. Hgb A1c No results for input(s): HGBA1C in the last 72 hours. Lipid Profile No results for input(s): CHOL, HDL, LDLCALC, TRIG, CHOLHDL, LDLDIRECT in the last 72 hours. Thyroid function studies Recent Labs    03/18/20 2315  TSH 4.584*   Anemia work up No results for input(s): VITAMINB12, FOLATE, FERRITIN, TIBC, IRON, RETICCTPCT in the last 72 hours. Urinalysis    Component Value Date/Time   COLORURINE YELLOW 10/29/2019 0957   APPEARANCEUR CLEAR 10/29/2019 0957   LABSPEC 1.018 10/29/2019 0957   PHURINE 6.0 10/29/2019 0957   GLUCOSEU NEGATIVE 10/29/2019 0957   HGBUR NEGATIVE 10/29/2019 0957   BILIRUBINUR NEGATIVE 10/29/2019 0957   KETONESUR NEGATIVE 10/29/2019 0957   PROTEINUR NEGATIVE 10/29/2019 0957   NITRITE NEGATIVE 10/29/2019 0957   LEUKOCYTESUR NEGATIVE 10/29/2019 0957   Sepsis Labs Invalid input(s): PROCALCITONIN,  WBC,  LACTICIDVEN Microbiology Recent Results (from the past 240 hour(s))  Respiratory Panel by RT PCR (Flu A&B, Covid) - Nasopharyngeal Swab     Status: None   Collection Time: 03/18/20  5:11 PM   Specimen: Nasopharyngeal Swab  Result Value Ref Range Status   SARS Coronavirus 2 by RT PCR NEGATIVE NEGATIVE Final    Comment:  (NOTE) SARS-CoV-2 target nucleic acids are NOT DETECTED.  The SARS-CoV-2 RNA is generally detectable in upper respiratoy specimens during the acute phase of infection. The lowest concentration of SARS-CoV-2 viral copies this assay can detect is 131 copies/mL. A negative result does not preclude SARS-Cov-2 infection and should not be used as the sole basis for treatment or other patient management decisions. A negative result may occur with  improper specimen collection/handling, submission of specimen other than nasopharyngeal swab, presence of viral mutation(s) within the areas targeted by this assay, and inadequate number of viral copies (<131 copies/mL). A negative result must be combined with clinical observations, patient history, and epidemiological information. The expected result is Negative.  Fact Sheet for Patients:  PinkCheek.be  Fact Sheet for Healthcare Providers:  GravelBags.it  This test is no t yet approved or cleared by the [place] FDA and  has been authorized for detection and/or diagnosis of SARS-CoV-2 by FDA under an Emergency Use Authorization (EUA). This EUA will remain  in effect (meaning this test can be used) for the duration of the COVID-19 declaration under Section 564(b)(1) of the Act, 21 U.S.C. section 360bbb-3(b)(1), unless the authorization is terminated or revoked sooner.     Influenza A by PCR NEGATIVE NEGATIVE Final   Influenza B by PCR NEGATIVE NEGATIVE Final    Comment: (NOTE) The Xpert Xpress SARS-CoV-2/FLU/RSV assay is intended as an aid in  the diagnosis of influenza from Nasopharyngeal swab specimens and  should not be used as a sole basis for treatment. Nasal washings and  aspirates are unacceptable for Xpert Xpress SARS-CoV-2/FLU/RSV  testing.  Fact Sheet for Patients: PinkCheek.be  Fact Sheet for Healthcare  Providers: GravelBags.it  This test is not yet approved or cleared by the [place] FDA and  has been authorized for detection and/or diagnosis  of SARS-CoV-2 by  FDA under an Emergency Use Authorization (EUA). This EUA will remain  in effect (meaning this test can be used) for the duration of the  Covid-19 declaration under Section 564(b)(1) of the Act, 21  U.S.C. section 360bbb-3(b)(1), unless the authorization is  terminated or revoked. Performed at [facility], [address] [place],  [address]      Total time spend on discharging this patient, including the last patient exam, discussing the hospital stay, instructions for ongoing care as it relates to all pertinent caregivers, as well as preparing the medical discharge records, prescriptions, and/or referrals as applicable, is 45 minutes.    Enzo Bi, MD  [facility] 03/20/2020, 1:12 PM  If 7PM-7AM, please contact night-coverage

## 2020-03-20 NOTE — Telephone Encounter (Signed)
Secure chat message received from Christell Faith, [place] stating that the patient is currently admitted  He needs a ZIO AT monitor placed x 14 days for pre-syncope.  I did go to the floor and place his ZIO AT. The monitor was explained to the patient and he voices understanding.   Serial #  [id number]

## 2020-03-20 NOTE — Progress Notes (Signed)
Pt discharged home, understood discharge instructions. IV removed from right AC, ZIO heart monitor in place , pt expressed understanding in regards to upkeep with monitor. Pt wheeled down to medical mall entrance and assisted into car by Nurse tech.

## 2020-03-20 NOTE — Progress Notes (Signed)
Progress Note  Patient Name: William Crawford Date of Encounter: 03/20/2020  Primary Cardiologist: Burt Knack  Subjective   No further dizziness or presyncopal episodes. Chest pain persists, though is unchanged and worse with movement and deep inspiration and improves with oxycodone. No significant arrhythmias, prolonged pauses, or high-grade AV block on telemetry.   Inpatient Medications    Scheduled Meds: . aspirin EC  81 mg Oral Daily  . atorvastatin  40 mg Oral Daily  . clopidogrel  75 mg Oral Daily  . enoxaparin (LOVENOX) injection  40 mg Subcutaneous Q2200  . gabapentin  600 mg Oral TID  . ketotifen  1 drop Both Eyes BID  . loratadine  10 mg Oral Daily  . omega-3 acid ethyl esters  2 g Oral BID  . sacubitril-valsartan  1 tablet Oral BID  . sodium chloride flush  3 mL Intravenous Q12H  . spironolactone  25 mg Oral Daily  . thiamine  100 mg Oral Daily   Continuous Infusions:  PRN Meds: acetaminophen **OR** acetaminophen, ondansetron **OR** ondansetron (ZOFRAN) IV, oxyCODONE   Vital Signs    Vitals:   03/19/20 2017 03/19/20 2247 03/20/20 0511 03/20/20 0758  BP: 122/77 113/69 114/75 106/71  Pulse: (!) 53 (!) 53 62 66  Resp: 18 18 16 18   Temp: 97.7 F (36.5 C) 97.7 F (36.5 C) 97.7 F (36.5 C) 97.7 F (36.5 C)  TempSrc: Oral Oral Oral Oral  SpO2: 100% 99% 98% 97%  Weight:   84.1 kg   Height:        Intake/Output Summary (Last 24 hours) at 03/20/2020 0850 Last data filed at 03/19/2020 1440 Gross per 24 hour  Intake 960 ml  Output --  Net 960 ml   Filed Weights   03/18/20 1306 03/20/20 0511  Weight: 86.2 kg 84.1 kg    Telemetry    Sinus with heart rates in the 50s to 60s bpm, RBBB - Personally Reviewed  ECG    No new tracings - Personally Reviewed  Physical Exam   GEN: No acute distress.   Neck: No JVD. Cardiac: RRR, no murmurs, rubs, or gallops.  Respiratory: Clear to auscultation bilaterally.  GI: Soft, nontender, non-distended.   MS:  No edema; No deformity. Neuro:  Alert and oriented x 3; Nonfocal.  Psych: Normal affect.  Labs    Chemistry Recent Labs  Lab 03/18/20 1309 03/18/20 2315 03/19/20 0533  NA 135  --  137  K 3.7  --  3.6  CL 100  --  103  CO2 25  --  26  GLUCOSE 123*  --  99  BUN 14  --  12  CREATININE 1.29* 1.08 0.91  CALCIUM 9.1  --  8.7*  PROT 7.3  --  7.1  ALBUMIN 4.5  --  4.3  AST 20  --  12*  ALT 23  --  19  ALKPHOS 76  --  73  BILITOT 0.7  --  0.5  GFRNONAA >60 >60 >60  ANIONGAP 10  --  8     Hematology Recent Labs  Lab 03/18/20 1309 03/18/20 2315 03/19/20 0533  WBC 6.4 6.2 6.3  RBC 3.90* 3.89* 4.01*  HGB 11.8* 11.7* 12.1*  HCT 34.9* 35.3* 36.3*  MCV 89.5 90.7 90.5  MCH 30.3 30.1 30.2  MCHC 33.8 33.1 33.3  RDW 13.3 13.2 13.2  PLT 238 233 228    Cardiac EnzymesNo results for input(s): TROPONINI in the last 168 hours. No results for input(s):  TROPIPOC in the last 168 hours.   BNPNo results for input(s): BNP, PROBNP in the last 168 hours.   DDimer No results for input(s): DDIMER in the last 168 hours.   Radiology    DG Chest 2 View  Result Date: 03/18/2020 IMPRESSION: No active cardiopulmonary disease. Electronically Signed   By: Franchot Gallo M.D.   On: 03/18/2020 13:56   CT Angio Chest PE W/Cm &/Or Wo Cm  Result Date: 03/18/2020 IMPRESSION: 1. Focal 1.7 cm outpouching of the distal ascending thoracic aorta is suspicious for a pseudoaneurysm related to ECMO catheter placement. Surgical consultation is recommended. 2. No evidence for an acute pulmonary embolism. Electronically Signed   By: Constance Holster M.D.   On: 03/18/2020 17:55    Cardiac Studies   Limited echo 03/19/2020: 1. Left ventricular ejection fraction, by estimation, is 50 to 55%. The  left ventricle has low normal function. The left ventricle demonstrates  regional wall motion abnormalities (see scoring diagram/findings for  description). There is mild hypokinesis  of the left ventricular,  mid-apical anteroseptal wall.  2. The right ventricular size is normal.  3. The mitral valve is normal in structure. Trivial mitral valve  regurgitation.  4. The inferior vena cava is normal in size with <50% respiratory  variability, suggesting right atrial pressure of 8 mmHg. __________  2D echo 02/20/2020: 1. Left ventricular ejection fraction, by estimation, is 55%. The left  ventricle has normal function. The left ventricle demonstrates regional  wall motion abnormalities with mild anteroseptal hypokinesis. Left  ventricular diastolic parameters are  consistent with Grade II diastolic dysfunction (pseudonormalization).  2. Right ventricular systolic function is normal. The right ventricular  size is normal. Tricuspid regurgitation signal is inadequate for assessing  PA pressure.  3. The mitral valve is normal in structure. No evidence of mitral valve  regurgitation. No evidence of mitral stenosis.  4. The aortic valve is tricuspid. Aortic valve regurgitation is not  visualized. No aortic stenosis is present.  5. The inferior vena cava is normal in size with greater than 50%  respiratory variability, suggesting right atrial pressure of 3 mmHg. __________  Limited Echo 11/02/19 EF 40-45, normal RVSF  Limited Echo 11/01/19 EF 30-35  Limited Echo 10/31/19 EF 25-30  Limited Echo 10/29/19 EF 20-25  Echo 10/26/19 EF < 20, Gr 2 DD, AS, lat, ant, inf, apical severe AK, severely reduced RVSF, mild RAE, mild MR  Limited Echo 10/25/19 EF 45, mild LVH, normal RVSF, mod pericardial effusion w/ tamponade physio  CTO PCI 10/25/19  Mid LAD lesion is 100% stenosed.  1st Mrg lesion is 100% stenosed.  Mid Cx to Dist Cx lesion is 50% stenosed.  Prox RCA to Mid RCA lesion is 35% stenosed.  A covered stent placed using a STENT PK PAPYRUS 2.5X20. and a 2.5 x 15 mm PK PAPYRUS with unsuccessful sealing of the perforation. 1. Unsuccessful CTO PCI of the LAD. Able to cross the lesion  with a wire. With initial balloon inflation with a 2.0 mm balloon the patient developed a large perforation. We were unable to seal the perforation with prolonged balloon inflations and placement of Covered stents x 2. Balloon left inflated in the vessel to tamponade the perforation until definitive surgery done. Plan emergent surgery for oversewing of the perforation.  Cardiac catheterization 10/17/19 LAD mid 100 (CTO) L-L collats; D1 mild dz LCx mid 50; OM1 100  RCA prox 35, mid 95 PCI: 4 x 18 mm Resolute Onyx DES to [facility]  Echocardiogram 10/17/19 EF 35-40, inf-lat + inf HK, Gr 2 DD, normal RVSF  Patient Profile     49 y.o. male with history of CAD with NSTEMI on 5/25/121 status post PCI/DES to the RCA with residual CTO of the proximal LAD status post attempted CTO intervention on 08/30/5460 complicated by LAD perforation, pericardial effusion, and cardiac tamponade requiring emergent OR repair complicated by VF arrest in the OR with approximately 15 minutes of CPR, cardiogenic shock requiring cardiopulmonary bypass, ECMO, and Impella along with vasopressors and inotropic support, right atrial thrombus status post Coumadin, HFrEF secondary to ICM with subsequent normalization of LV systolic function, heavy EtOH use, prior tobacco use, strong family history of CAD, and hypertriglyceridemia who is being seen today for the evaluation of presyncope at the request of Dr. Jonelle Sidle.  Assessment & Plan    1. Presyncope: -Possibly vasovagal in etiology exacerbated by bradycardia  -No evidence of ACS by HS-Tn -Chest discomfort is consistent with MSK etiology  -CTA chest negative for PE -No significant arrhythmias, prolonged pauses, or high-grade AV block on telemetry   -Place Zio AT prior to discharge, our office will coordinate this around lunchtime -Orthostatics normal  2. Distal ascending thoracic aortic pseudoaneurysm: -Case discussed between Drs. End and Prescott Gum with the above felt to be related  to stalk from prior Impella without need for further evaluation/intervention at this time  3. CAD involving the native coronary arteries with recent NSTEMI with LAD perforation, cardaic tamponade, and cardiac arrest with elevated HS-Tn and ongoing chest wall discomfort: -Minimally elevated HS-Tn not consistent with ACS -No indication for heparin gtt at this time -Limited echo with preserved LVSF as above  -Continue ASA and Plavix -Lipitor  -He does have ongoing chest wall pain which has been long standing dating back to his surgery and is consistent with MSK etiology  -PTA oxycodone prn  4. HFrEF secondary to ICM/myocardial stunning: -He appears euvolemic and well compensated with NYHA class I symptoms  -He has subsequently had recovery of EF -Limited echo as above -Continue to hold Coreg with noted bradycardic rates, though rates are improving -Continue Entresto and spironolactone  -Not requiring standing diuretic  -With recovery of EF, no indication for ICD at this time, unless significant malignant arrhythmia is noted on telemetry or Zio patch  5. History of right atrial thrombus: -Related to venous cannulation for ECMO -Most recent echo from 02/20/2020 without evidence of thrombus -Coumadin stopped 02/20/2020  6. History of EtOH/tobacco use: -Abstinent   7. Anemia: -Stable  8. Abnormal TSH: -Likely subclinical  -Outpatient follow up  9. Hypertriglyceridemia: - On Vascepa as an outpatient    For questions or updates, please contact [place] Please consult [email or web address] for contact info under Cardiology/STEMI.    Signed, Christell Faith, PA-C [place] Pager: [phone] 03/20/2020, 8:50 AM

## 2020-03-20 NOTE — Progress Notes (Signed)
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 He has been on oxycodone 10 mg bid prn. I have reviewed the  Controlled Substance Database for the past 12 months. His last prescription was on 03/06/2020 and was for 14 days. I have provided a prescription for oxycodone 10 mg bid prn #14 no refills which was authenticated through [employer]. Further management of his pain will need to be through his PCP, surgeon, or pain management. I will not provide refills. This was discussed in person with our rounding MD today who agrees with the above.

## 2020-03-21 ENCOUNTER — Telehealth (HOSPITAL_COMMUNITY): Payer: Self-pay | Admitting: *Deleted

## 2020-03-21 NOTE — Telephone Encounter (Signed)
Patient would like to remain out of work while he wears zio monitor. Will mail letter tomorrow for pt.

## 2020-03-21 NOTE — Telephone Encounter (Signed)
Pt left a VM asking if it was ok for him to work. Routed to [place] for advice

## 2020-03-21 NOTE — Telephone Encounter (Signed)
Yes, ok at this point.

## 2020-03-22 NOTE — Telephone Encounter (Signed)
Letter mailed to pts home address.

## 2020-04-03 ENCOUNTER — Encounter: Payer: Self-pay | Admitting: Physical Medicine and Rehabilitation

## 2020-04-07 NOTE — Progress Notes (Signed)
Cardiology Office Note   Date:  04/11/2020   ID:  William Crawford, William Crawford 05-17-1971, MRN [id number]  PCP:  [name], MD  Cardiologist: Dr. Burt Knack, MD  Chief Complaint  Patient presents with  . Follow-up  . Hospitalization Follow-up    History of Present Illness: William Crawford is a 49 y.o. male who presents for hospital follow-up, seen by Dr. Burt Knack.  William Crawford has a history of CAD with NSTEMI on 10/17/2019 with PCI/DES to the RCA with residual CTO of the proximal LAD.  CTO intervention attempted 10/25/2019 however this was complicated by LAD perforation, pericardial effusion and cardiac tamponade requiring emergent OR repair complicated by VF arrest in the OR with approximately 15 minutes of CPR with cardiogenic shock requiring ECMO and Impella along with vasopressor inotropic support. Also with right atrial thrombus at Impella treated with Coumadin, ischemic cardiomyopathy with subsequent normalization of LV function (initially less than 20% on 10/26/2019 with improvement to 40 to 45%>> 55%), heavy EtOH, prior tobacco use, strong family history of CAD and hypertension triglyceridemia.   He was recently seen during a hospitalization for the evaluation of presyncopal symptoms including dizziness, nausea and diaphoresis. There was no LOC.  He was at work at the time of the event therefore a coworker prompted him to go to the ED for further work-up.  EKG showed bradycardia at 58 bpm with RBBB and no acute ST-T wave changes.  Cardiac work-up was essentially negative with no significant arrhythmias on telemetry, orthostatic vitals were found ot be normal. Symptoms felt to possibly be secondary to a vasovagal episode.  HRs remained in the 40-50s therefore his carvedilol was stopped. Plan was for 2-week cardiac monitor to evaluate for any significant arrhythmias. He was continued on Entresto, Aldactone, ASA, Plavix and Lipitor.  He has known chronic sternal chest discomfort,  reproducible with palpitation.  Cardiac monitor dated 03/20/2020 however has not yet been read. He presents today for hospital follow up and states that he has felt better however continues to have intermittent episodes of mild dizziness with no symptoms similar to hospital presentation. Symptoms occur mostly with change of position, never with sitting. He has no prodromal symptoms of nausea, SOB, visual changes, palpitaitons or chest pain. BP today is low normal at 112/60. We discussed that monitor results are not back yet and will follow up with this.   Past Medical History:  Diagnosis Date  . Alcohol abuse   . CAD (coronary artery disease)   . Cardiac arrest ([place])   . Cardiogenic shock ([place])   . GERD (gastroesophageal reflux disease)   . HFrEF (heart failure with reduced ejection fraction) ([place])   . Hypertriglyceridemia   . Ischemic cardiomyopathy   . Right atrial thrombus   . Tobacco abuse     Past Surgical History:  Procedure Laterality Date  . APPLICATION OF WOUND VAC  10/25/2019   Procedure: Application Of Wound Vac;  Surgeon: [name], MD;  Location: [facility];  Service: Open Heart Surgery;;  . CANNULATION FOR ECMO (EXTRACORPOREAL MEMBRANE OXYGENATION)  10/25/2019   Procedure: Cannulation For Ecmo (Extracorporeal Membrane Oxygenation) @ 7893;  Surgeon: [name], MD;  Location: [place];  Service: Open Heart Surgery;;  . CANNULATION FOR ECMO (EXTRACORPOREAL MEMBRANE OXYGENATION) N/A 10/30/2019   Procedure: DECANNULATION OF ECMO (EXTRACORPOREAL MEMBRANE OXYGENATION);  Surgeon: Prescott Gum, Collier Salina, MD;  Location: [place];  Service: Open Heart Surgery;  Laterality: N/A;  Pump standby  . CHEST EXPLORATION  10/25/2019   Procedure: Chest Exploration for repair of coronary perforation (repair of LAD);  Surgeon: Prescott Gum, Collier Salina, MD;  Location: [place];  Service: Open Heart Surgery;;  . CORONARY CTO INTERVENTION N/A 10/25/2019   Procedure: CORONARY CTO INTERVENTION;  Surgeon: Martinique, Peter M, MD;   Location: [facility];  Service: Cardiovascular;  Laterality: N/A;  . CORONARY STENT INTERVENTION N/A 10/17/2019   Procedure: CORONARY STENT INTERVENTION;  Surgeon: Sherren Mocha, MD;  Location: [place] [facility];  Service: Cardiovascular;  Laterality: N/A;  . LEFT HEART CATH AND CORONARY ANGIOGRAPHY N/A 10/17/2019   Procedure: LEFT HEART CATH AND CORONARY ANGIOGRAPHY;  Surgeon: Sherren Mocha, MD;  Location: [place] [facility];  Service: Cardiovascular;  Laterality: N/A;  . MEDIASTERNOTOMY  10/25/2019   Procedure: Median Sternotomy.;  Surgeon: [name], MD;  Location: [place];  Service: Open Heart Surgery;;  . PLACEMENT OF IMPELLA LEFT VENTRICULAR ASSIST DEVICE  10/30/2019   Procedure: Placement Of Impella Left Ventricular Assist Device 5.5 USING 10MM HEMASHIELD PLATINUM GRAFT;  Surgeon: [name], MD;  Location: [place];  Service: Open Heart Surgery;;  graft placed into aorta  . REMOVAL OF IMPELLA LEFT VENTRICULAR ASSIST DEVICE N/A 11/03/2019   Procedure: REMOVAL OF IMPELLA 5.5 LEFT VENTRICULAR ASSIST DEVICE;  Surgeon: [name], MD;  Location: [place];  Service: Open Heart Surgery;  Laterality: N/A;  . STERNAL CLOSURE N/A 10/30/2019   Procedure: Sternal Closure;  Surgeon: [name], MD;  Location: [place];  Service: Open Heart Surgery;  Laterality: N/A;  . TEE WITHOUT CARDIOVERSION N/A 10/25/2019   Procedure: TRANSESOPHAGEAL ECHOCARDIOGRAM (TEE);  Surgeon: Prescott Gum, Collier Salina, MD;  Location: [place];  Service: Open Heart Surgery;  Laterality: N/A;  . TEE WITHOUT CARDIOVERSION N/A 10/30/2019   Procedure: TRANSESOPHAGEAL ECHOCARDIOGRAM (TEE);  Surgeon: Prescott Gum, Collier Salina, MD;  Location: [place];  Service: Open Heart Surgery;  Laterality: N/A;  . TEE WITHOUT CARDIOVERSION N/A 11/03/2019   Procedure: TRANSESOPHAGEAL ECHOCARDIOGRAM (TEE);  Surgeon: Prescott Gum, Collier Salina, MD;  Location: [place];  Service: Open Heart Surgery;  Laterality: N/A;     Current Outpatient Medications  Medication Sig  Dispense Refill  . aspirin EC 81 MG tablet Take 1 tablet (81 mg total) by mouth daily. Swallow whole. 90 tablet 3  . atorvastatin (LIPITOR) 40 MG tablet Take 1 tablet (40 mg total) by mouth daily. 90 tablet 3  . carvedilol (COREG) 6.25 MG tablet Hold until followup with outpatient cardiology due to low heart rate. 60 tablet 3  . cetirizine (ZYRTEC) 10 MG tablet Take 10 mg by mouth daily.    . clopidogrel (PLAVIX) 75 MG tablet Take 1 tablet (75 mg total) by mouth daily. 90 tablet 3  . gabapentin (NEURONTIN) 300 MG capsule Take 2 capsules (600 mg total) by mouth 3 (three) times daily. 540 capsule 3  . icosapent Ethyl (VASCEPA) 1 g capsule Take 2 capsules (2 g total) by mouth 2 (two) times daily. 120 capsule 5  . oxyCODONE 10 MG TABS Take 1 tablet (10 mg total) by mouth 2 (two) times daily as needed for severe pain. 14 tablet 0  . pregabalin (LYRICA) 150 MG capsule Take 1 capsule (150 mg total) by mouth daily. Take at bedtime 30 capsule 1  . spironolactone (ALDACTONE) 25 MG tablet Take 1 tablet (25 mg total) by mouth daily. 30 tablet 1  . thiamine 100 MG tablet Take 1 tablet (100 mg total) by mouth daily. 30 tablet 1  .  torsemide (DEMADEX) 20 MG tablet Take 1 tablet (20 mg total) by mouth every other day. 45 tablet 3  . sacubitril-valsartan (ENTRESTO) 49-51 MG Take 1 tablet by mouth 2 (two) times daily. 60 tablet 11   No current facility-administered medications for this visit.    Allergies:   Patient has no known allergies.    Social History:  The patient  reports that he quit smoking about 5 months ago. He has never used smokeless tobacco. He reports current alcohol use. He reports that he does not use drugs.   Family History:  The patient's family history includes Heart attack in his father.    ROS:  Please see the history of present illness.Otherwise, review of systems are positive for none.   All other systems are reviewed and negative.    PHYSICAL EXAM: VS:  BP 112/60   Pulse 72    Ht 5' 8"  (1.727 m)   Wt 184 lb (83.5 kg)   SpO2 98%   BMI 27.98 kg/m  , BMI Body mass index is 27.98 kg/m.   General: Well developed, well nourished, NAD Neck: Negative for carotid bruits. No JVD Lungs:Clear to ausculation bilaterally. No wheezes, rales, or rhonchi. Breathing is unlabored. Cardiovascular: RRR with S1 S2. No murmurs Extremities: No edema. Radial pulses 2+ bilaterally Neuro: Alert and oriented. No focal deficits. No facial asymmetry. MAE spontaneously. Psych: Responds to questions appropriately with normal affect.     EKG:  EKG is not ordered today.  Recent Labs: 10/17/2019: B Natriuretic Peptide 608.8 03/18/2020: Magnesium 2.3; TSH 4.584 03/19/2020: ALT 19; BUN 12; Creatinine, Ser 0.91; Hemoglobin 12.1; Platelets 228; Potassium 3.6; Sodium 137    Lipid Panel    Component Value Date/Time   CHOL 154 01/05/2020 1257   TRIG 248 (H) 01/05/2020 1257   HDL 37 (L) 01/05/2020 1257   CHOLHDL 4.2 01/05/2020 1257   VLDL 50 (H) 01/05/2020 1257   LDLCALC 67 01/05/2020 1257    Wt Readings from Last 3 Encounters:  04/11/20 184 lb (83.5 kg)  03/20/20 185 lb 4.8 oz (84.1 kg)  03/06/20 190 lb (86.2 kg)     Other studies Reviewed: Additional studies/ records that were reviewed today include:  Review of the above records demonstrates:   Limited echo 03/19/2020: 1. Left ventricular ejection fraction, by estimation, is 50 to 55%. The  left ventricle has low normal function. The left ventricle demonstrates  regional wall motion abnormalities (see scoring diagram/findings for  description). There is mild hypokinesis  of the left ventricular, mid-apical anteroseptal wall.  2. The right ventricular size is normal.  3. The mitral valve is normal in structure. Trivial mitral valve  regurgitation.  4. The inferior vena cava is normal in size with <50% respiratory  variability, suggesting right atrial pressure of 8 mmHg. __________  2D echo 02/20/2020: 1. Left  ventricular ejection fraction, by estimation, is 55%. The left  ventricle has normal function. The left ventricle demonstrates regional  wall motion abnormalities with mild anteroseptal hypokinesis. Left  ventricular diastolic parameters are  consistent with Grade II diastolic dysfunction (pseudonormalization).  2. Right ventricular systolic function is normal. The right ventricular  size is normal. Tricuspid regurgitation signal is inadequate for assessing  PA pressure.  3. The mitral valve is normal in structure. No evidence of mitral valve  regurgitation. No evidence of mitral stenosis.  4. The aortic valve is tricuspid. Aortic valve regurgitation is not  visualized. No aortic stenosis is present.  5. The inferior vena cava  is normal in size with greater than 50%  respiratory variability, suggesting right atrial pressure of 3 mmHg. __________  Limited Echo 11/02/19 EF 40-45, normal RVSF  Limited Echo 11/01/19 EF 30-35  Limited Echo 10/31/19 EF 25-30  Limited Echo 10/29/19 EF 20-25  Echo 10/26/19 EF < 20, Gr 2 DD, AS, lat, ant, inf, apical severe AK, severely reduced RVSF, mild RAE, mild MR  Limited Echo 10/25/19 EF 45, mild LVH, normal RVSF, mod pericardial effusion w/ tamponade physio  CTO PCI 10/25/19  Mid LAD lesion is 100% stenosed.  1st Mrg lesion is 100% stenosed.  Mid Cx to Dist Cx lesion is 50% stenosed.  Prox RCA to Mid RCA lesion is 35% stenosed.  A covered stent placed using a STENT PK PAPYRUS 2.5X20. and a 2.5 x 15 mm PK PAPYRUS with unsuccessful sealing of the perforation. 1. Unsuccessful CTO PCI of the LAD. Able to cross the lesion with a wire. With initial balloon inflation with a 2.0 mm balloon the patient developed a large perforation. We were unable to seal the perforation with prolonged balloon inflations and placement of Covered stents x 2. Balloon left inflated in the vessel to tamponade the perforation until definitive surgery done. Plan emergent  surgery for oversewing of the perforation.  Cardiac catheterization 10/17/19 LAD mid 100 (CTO) L-L collats; D1 mild dz LCx mid 50; OM1 100  RCA prox 35, mid 95 PCI: 4 x 18 mm Resolute Onyx DES to mRCA  Echocardiogram 10/17/19 EF 35-40, inf-lat + inf HK, Gr 2 DD, normal RVSF  ASSESSMENT AND PLAN:  1. Presyncope: -Hospital workup as above which was essentially negative, felt to possibly be vasovagal in etiology exacerbated by bradycardia>>carvedilol discontinued.  -BP on the lower side today>>possible that symptoms are secondary to hypotension?  -Will decrease Entresto to 49/51 dosing>>last EF 02/2020 stable at 50-55%>>continue spironolactone  -ZIO placed prior to discharge, however not yet resulted. -Orthostatics were normal during hospital course   2. Distal ascending thoracic aortic pseudoaneurysm: -Case discussed between Drs. End and Prescott Gum with the above felt to be related to stalk from prior Impella without need for further evaluation/intervention at this time -Keep follow up with Dr. Prescott Gum 04/2020  3. CAD s/p NSTEMI 09/2019: -Has chronic chest pain however this has not changed  -Asking for pain medication refill today>>follows at the pain clinic>>deferred there>felt to be MSK in etiology  -Continue ASA, Plavix and statin  -Beta blocker stopped due to bradycardia   4.ICM: -Last echo 02/2020 with EF at 50-55%,mild hypokinesis of the left ventricular, mid-apical anteroseptal wall consistent with prior infarct  -Appears euvolemic on exam. Maintained with torsemide every other day  -Continue Entresto and spironolactone   5. History of right atrial thrombus: -Related to venous cannulation for ECMO -Most recent echo from 02/20/2020 without evidence of thrombus -Coumadin stopped 02/20/2020  6. Abnormal TSH: -Found during hospital course>OP follow up with PCP   7. Hypertriglyceridemia: -Continue Vascepa   Current medicines are reviewed at length with the  patient today.  The patient does not have concerns regarding medicines.  The following changes have been made: decrease Entresto to 49/10m PO BID  Labs/ tests ordered today include: None  No orders of the defined types were placed in this encounter.    Disposition:   FU with Dr. CBurt Knack in 4 months  Signed, JKathyrn Drown NP  04/11/2020 12:13 PM    [address]  [id number]: [phone]  [phone]; Fax: [phone]

## 2020-04-10 DIAGNOSIS — L720 Epidermal cyst: Secondary | ICD-10-CM | POA: Diagnosis not present

## 2020-04-10 DIAGNOSIS — D2372 Other benign neoplasm of skin of left lower limb, including hip: Secondary | ICD-10-CM | POA: Diagnosis not present

## 2020-04-11 ENCOUNTER — Other Ambulatory Visit: Payer: Self-pay

## 2020-04-11 ENCOUNTER — Encounter: Payer: Self-pay | Admitting: Cardiology

## 2020-04-11 ENCOUNTER — Ambulatory Visit: Payer: BLUE CROSS/BLUE SHIELD | Admitting: Cardiology

## 2020-04-11 VITALS — BP 112/60 | HR 72 | Ht 68.0 in | Wt 184.0 lb

## 2020-04-11 DIAGNOSIS — Z1159 Encounter for screening for other viral diseases: Secondary | ICD-10-CM | POA: Diagnosis not present

## 2020-04-11 DIAGNOSIS — I5022 Chronic systolic (congestive) heart failure: Secondary | ICD-10-CM

## 2020-04-11 DIAGNOSIS — Z0000 Encounter for general adult medical examination without abnormal findings: Secondary | ICD-10-CM | POA: Diagnosis not present

## 2020-04-11 DIAGNOSIS — R0789 Other chest pain: Secondary | ICD-10-CM

## 2020-04-11 DIAGNOSIS — I10 Essential (primary) hypertension: Secondary | ICD-10-CM | POA: Diagnosis not present

## 2020-04-11 DIAGNOSIS — I513 Intracardiac thrombosis, not elsewhere classified: Secondary | ICD-10-CM

## 2020-04-11 DIAGNOSIS — R55 Syncope and collapse: Secondary | ICD-10-CM

## 2020-04-11 DIAGNOSIS — I255 Ischemic cardiomyopathy: Secondary | ICD-10-CM

## 2020-04-11 DIAGNOSIS — Z23 Encounter for immunization: Secondary | ICD-10-CM | POA: Diagnosis not present

## 2020-04-11 DIAGNOSIS — Z1211 Encounter for screening for malignant neoplasm of colon: Secondary | ICD-10-CM | POA: Diagnosis not present

## 2020-04-11 MED ORDER — TORSEMIDE 20 MG PO TABS
20.0000 mg | ORAL_TABLET | ORAL | 3 refills | Status: DC
Start: 2020-04-11 — End: 2020-06-19

## 2020-04-11 MED ORDER — ENTRESTO 49-51 MG PO TABS: 1.0000 | ORAL_TABLET | Freq: Two times a day (BID) | ORAL | 11 refills | Status: DC

## 2020-04-11 MED ORDER — GABAPENTIN 300 MG PO CAPS: 600.0000 mg | ORAL_CAPSULE | Freq: Three times a day (TID) | ORAL | 3 refills | Status: DC

## 2020-04-11 NOTE — Patient Instructions (Signed)
Medication Instructions:  Your physician has recommended you make the following change in your medication:  1. DECREASE ENTRESTO TO 49-51 TWICE DAILY.  *If you need a refill on your cardiac medications before your next appointment, please call your pharmacy*   Lab Work: NONE If you have labs (blood work) drawn today and your tests are completely normal, you will receive your results only by:  [place] (if you have MyChart) OR  A paper copy in the mail If you have any lab test that is abnormal or we need to change your treatment, we will call you to review the results.   Testing/Procedures: NONE   Follow-Up: At [facility], you and your health needs are our priority.  As part of our continuing mission to provide you with exceptional heart care, we have created designated Provider Care Teams.  These Care Teams include your primary Cardiologist (physician) and Advanced Practice Providers (APPs -  Physician Assistants and Nurse Practitioners) who all work together to provide you with the care you need, when you need it.  We recommend signing up for the patient portal called "MyChart".  Sign up information is provided on this After Visit Summary.  MyChart is used to connect with patients for Virtual Visits (Telemedicine).  Patients are able to view lab/test results, encounter notes, upcoming appointments, etc.  Non-urgent messages can be sent to your provider as well.   To learn more about what you can do with MyChart, go to [email or web address].    Your next appointment:   5-6 month(s)  The format for your next appointment:   In Person  Provider:   You may see Sherren Mocha, MD or one of the following Advanced Practice Providers on your designated Care Team:    Melina Copa, PA-C  Ermalinda Barrios, PA-C

## 2020-04-16 ENCOUNTER — Telehealth: Payer: Self-pay | Admitting: *Deleted

## 2020-04-16 NOTE — Telephone Encounter (Signed)
He has chronic chest pain that has been felt to be MSK in etiology that responds to narcotic pain medication. He was seen by PCP and cardiology for hospital follow up on 04/11/2020 with continued chronic and unchanged chest pain. He has been referred to the pain clinic for this. Recommend follow up based on his last outpatient cardiology visit on 11/18, which was in 4 months. ED precautions.

## 2020-04-16 NOTE — Telephone Encounter (Signed)
-----   Message from [name], PA-C sent at 04/15/2020  5:21 PM EST ----- Heart monitor showed no significant arrhythmias or pauses.

## 2020-04-16 NOTE — Telephone Encounter (Signed)
Spoke to pt, notified of results of heart monitor. Pt states he continues to have constant pain in middle of chest. Pain varies throughout the day but is constant.  Denies other s/s at this time. Pt is asking what should he do at this point regarding pain?  It looks like pt saw his PCP 11/18 and he was also deferred to pain clinic for pain med, but does not have follow up with cardiology.  Please advise.

## 2020-04-16 NOTE — Telephone Encounter (Signed)
Spoke to pt, notified of information below. Pt agrees to follow up with the pain clinic regarding medication and will schedule 4 month follow up with cardiology. Notified pt our office will contact him to schedule appt. Forwarding to scheduling.

## 2020-04-17 ENCOUNTER — Telehealth: Payer: Self-pay | Admitting: *Deleted

## 2020-04-17 NOTE — Telephone Encounter (Signed)
William Crawford called stating he is experiencing extreme pain in the middle of his chest as if someone is "stabbing me with a sworn". Pt states he has an appointment with the pain management clinic next month but he needs medication until he is able to make it to that appointment. Per pt, he has reached out to his PCP who states he needs to contact Dr. Prescott Gum for this. A message has been sent to Dr. Prescott Gum. Notified the pt that we will reach back out once further instructions are received.

## 2020-04-18 ENCOUNTER — Other Ambulatory Visit: Payer: Self-pay | Admitting: Cardiothoracic Surgery

## 2020-04-18 MED ORDER — GABAPENTIN 300 MG PO CAPS
600.0000 mg | ORAL_CAPSULE | Freq: Three times a day (TID) | ORAL | 3 refills | Status: DC
Start: 2020-04-18 — End: 2020-08-21

## 2020-04-18 MED ORDER — GABAPENTIN 600 MG PO TABS: 600.0000 mg | ORAL_TABLET | Freq: Three times a day (TID) | ORAL | 1 refills | Status: DC

## 2020-04-22 ENCOUNTER — Telehealth: Payer: Self-pay | Admitting: *Deleted

## 2020-04-22 NOTE — Telephone Encounter (Signed)
Spoke with Mr. William Crawford regarding our conversation last week about pain medication refill. Dr. Prescott Gum increased his gabapentin to 620m. Pt aware of new prescription.

## 2020-04-30 DIAGNOSIS — Z1211 Encounter for screening for malignant neoplasm of colon: Secondary | ICD-10-CM | POA: Diagnosis not present

## 2020-05-06 DIAGNOSIS — Z1212 Encounter for screening for malignant neoplasm of rectum: Secondary | ICD-10-CM | POA: Diagnosis not present

## 2020-05-06 DIAGNOSIS — Z1211 Encounter for screening for malignant neoplasm of colon: Secondary | ICD-10-CM | POA: Diagnosis not present

## 2020-05-08 ENCOUNTER — Other Ambulatory Visit: Payer: Self-pay

## 2020-05-08 ENCOUNTER — Ambulatory Visit: Payer: BLUE CROSS/BLUE SHIELD | Admitting: Cardiothoracic Surgery

## 2020-05-08 ENCOUNTER — Other Ambulatory Visit: Payer: Self-pay | Admitting: *Deleted

## 2020-05-08 ENCOUNTER — Encounter: Payer: Self-pay | Admitting: Cardiothoracic Surgery

## 2020-05-08 VITALS — BP 128/82 | HR 80 | Resp 20 | Ht 68.0 in | Wt 192.0 lb

## 2020-05-08 DIAGNOSIS — M792 Neuralgia and neuritis, unspecified: Secondary | ICD-10-CM

## 2020-05-08 DIAGNOSIS — Z9889 Other specified postprocedural states: Secondary | ICD-10-CM | POA: Diagnosis not present

## 2020-05-08 DIAGNOSIS — T8189XD Other complications of procedures, not elsewhere classified, subsequent encounter: Secondary | ICD-10-CM | POA: Diagnosis not present

## 2020-05-08 DIAGNOSIS — T8189XS Other complications of procedures, not elsewhere classified, sequela: Secondary | ICD-10-CM

## 2020-05-08 DIAGNOSIS — T8189XA Other complications of procedures, not elsewhere classified, initial encounter: Secondary | ICD-10-CM | POA: Insufficient documentation

## 2020-05-08 MED ORDER — OXYCODONE HCL 10 MG PO TABS: 10.0000 mg | ORAL_TABLET | Freq: Two times a day (BID) | ORAL | 0 refills | Status: DC | PRN

## 2020-05-08 NOTE — Progress Notes (Signed)
PCP is [name], MD Referring Provider is Sherren Mocha, MD  Chief Complaint  Patient presents with  . Follow-up    2 month f/u re-eval neuropathic pain    HPI: Patient returns for follow-up of persistent neuropathic sternal pain after emergency sternotomy, repair of ruptured LAD from PCI with tamponade, cannulation for ECMO, sternal closure after decannulation for ECMO with placement of a Impella 5.5 direct aortic percutaneous LVAD.  This all happened 6 months ago.  The sternal incision has healed and a CT scan of the chest 6 weeks ago showed good sternal healing and no aortic disease.  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  Patient still has exquisite hypersensitivity and neuropathic pain.  He is trying to work as a Curator.  He has been weaned off narcotics and he takes Neurontin 600 mg 3 times daily without benefit.  He has an appointment to be seen in a pain clinic shortly.  Exam today the patient has exquisite tenderness over 2 of the upper sternal wires which are easily palpable and seem to trigger his discomfort.  At 6 months after surgery the integrity of the sternal closure is now complete and the sternal wires are no longer needed.  I have offered removal of the sternal wires under anesthesia as an outpatient procedure to the patient to help reduce his neuropathic pain and he agrees.  He will stop his Plavix and we will set him up for surgery as an outpatient in 5 days.  Otherwise his cardiac function is good and he has no cardiac symptoms.  Ejection fraction is 50-55% by last echo.  Past Medical History:  Diagnosis Date  . Alcohol abuse   . CAD (coronary artery disease)   . Cardiac arrest ([place])   . Cardiogenic shock ([place])   . GERD (gastroesophageal reflux disease)   . HFrEF (heart failure with reduced ejection fraction) ([place])   . Hypertriglyceridemia   . Ischemic  cardiomyopathy   . Right atrial thrombus   . Tobacco abuse     Past Surgical History:  Procedure Laterality Date  . APPLICATION OF WOUND VAC  10/25/2019   Procedure: Application Of Wound Vac;  Surgeon: [name], MD;  Location: [place];  Service: Open Heart Surgery;;  . CANNULATION FOR ECMO (EXTRACORPOREAL MEMBRANE OXYGENATION)  10/25/2019   Procedure: Cannulation For Ecmo (Extracorporeal Membrane Oxygenation) @ 2426;  Surgeon: [name], MD;  Location: [place];  Service: Open Heart Surgery;;  . CANNULATION FOR ECMO (EXTRACORPOREAL MEMBRANE OXYGENATION) N/A 10/30/2019   Procedure: DECANNULATION OF ECMO (EXTRACORPOREAL MEMBRANE OXYGENATION);  Surgeon: Prescott Gum, Collier Salina, MD;  Location: [place];  Service: Open Heart Surgery;  Laterality: N/A;  Pump standby  . CHEST EXPLORATION  10/25/2019   Procedure: Chest Exploration for repair of coronary perforation (repair of LAD);  Surgeon: Prescott Gum, Collier Salina, MD;  Location: [place];  Service: Open Heart Surgery;;  . CORONARY CTO INTERVENTION N/A 10/25/2019   Procedure: CORONARY CTO INTERVENTION;  Surgeon: Martinique, Peter M, MD;  Location: [place] [facility];  Service: Cardiovascular;  Laterality: N/A;  . CORONARY STENT INTERVENTION N/A 10/17/2019   Procedure: CORONARY STENT INTERVENTION;  Surgeon: Sherren Mocha, MD;  Location: [facility];  Service: Cardiovascular;  Laterality: N/A;  . LEFT HEART CATH AND CORONARY ANGIOGRAPHY N/A 10/17/2019   Procedure: LEFT HEART CATH AND CORONARY ANGIOGRAPHY;  Surgeon: Sherren Mocha,  MD;  Location: [facility] [facility];  Service: Cardiovascular;  Laterality: N/A;  . MEDIASTERNOTOMY  10/25/2019   Procedure: Median Sternotomy.;  Surgeon: [name], MD;  Location: [facility];  Service: Open Heart Surgery;;  . PLACEMENT OF IMPELLA LEFT VENTRICULAR ASSIST DEVICE  10/30/2019   Procedure: Placement Of Impella Left Ventricular Assist Device 5.5 USING 10MM HEMASHIELD PLATINUM GRAFT;  Surgeon: [name], MD;  Location: [place];   Service: Open Heart Surgery;;  graft placed into aorta  . REMOVAL OF IMPELLA LEFT VENTRICULAR ASSIST DEVICE N/A 11/03/2019   Procedure: REMOVAL OF IMPELLA 5.5 LEFT VENTRICULAR ASSIST DEVICE;  Surgeon: [name], MD;  Location: [place];  Service: Open Heart Surgery;  Laterality: N/A;  . STERNAL CLOSURE N/A 10/30/2019   Procedure: Sternal Closure;  Surgeon: [name], MD;  Location: [place];  Service: Open Heart Surgery;  Laterality: N/A;  . TEE WITHOUT CARDIOVERSION N/A 10/25/2019   Procedure: TRANSESOPHAGEAL ECHOCARDIOGRAM (TEE);  Surgeon: Prescott Gum, Collier Salina, MD;  Location: [place];  Service: Open Heart Surgery;  Laterality: N/A;  . TEE WITHOUT CARDIOVERSION N/A 10/30/2019   Procedure: TRANSESOPHAGEAL ECHOCARDIOGRAM (TEE);  Surgeon: Prescott Gum, Collier Salina, MD;  Location: [place];  Service: Open Heart Surgery;  Laterality: N/A;  . TEE WITHOUT CARDIOVERSION N/A 11/03/2019   Procedure: TRANSESOPHAGEAL ECHOCARDIOGRAM (TEE);  Surgeon: Prescott Gum, Collier Salina, MD;  Location: [place];  Service: Open Heart Surgery;  Laterality: N/A;    Family History  Problem Relation Age of Onset  . Heart attack Father     Social History Social History   Tobacco Use  . Smoking status: Former Smoker    Quit date: 10/23/2019    Years since quitting: 0.5  . Smokeless tobacco: Never Used  Substance Use Topics  . Alcohol use: Yes  . Drug use: No    Current Outpatient Medications  Medication Sig Dispense Refill  . aspirin EC 81 MG tablet Take 1 tablet (81 mg total) by mouth daily. Swallow whole. 90 tablet 3  . atorvastatin (LIPITOR) 40 MG tablet Take 1 tablet (40 mg total) by mouth daily. 90 tablet 3  . carvedilol (COREG) 6.25 MG tablet Hold until followup with outpatient cardiology due to low heart rate. 60 tablet 3  . cetirizine (ZYRTEC) 10 MG tablet Take 10 mg by mouth daily.    . clopidogrel (PLAVIX) 75 MG tablet Take 1 tablet (75 mg total) by mouth daily. 90 tablet 3  . gabapentin (NEURONTIN) 300 MG capsule Take 2 capsules  (600 mg total) by mouth 3 (three) times daily. 540 capsule 3  . gabapentin (NEURONTIN) 600 MG tablet Take 1 tablet (600 mg total) by mouth 3 (three) times daily. 90 tablet 1  . icosapent Ethyl (VASCEPA) 1 g capsule Take 2 capsules (2 g total) by mouth 2 (two) times daily. 120 capsule 5  . pregabalin (LYRICA) 150 MG capsule Take 1 capsule (150 mg total) by mouth daily. Take at bedtime 30 capsule 1  . sacubitril-valsartan (ENTRESTO) 49-51 MG Take 1 tablet by mouth 2 (two) times daily. 60 tablet 11  . spironolactone (ALDACTONE) 25 MG tablet Take 1 tablet (25 mg total) by mouth daily. 30 tablet 1  . thiamine 100 MG tablet Take 1 tablet (100 mg total) by mouth daily. 30 tablet 1  . torsemide (DEMADEX) 20 MG tablet Take 1 tablet (20 mg total) by mouth every other day. 45 tablet 3  . Oxycodone HCl 10 MG TABS Take 1 tablet (10 mg total)  by mouth 2 (two) times daily as needed. 14 tablet 0   No current facility-administered medications for this visit.    No Known Allergies  Review of Systems  Weight stable No fever No symptoms of Covid No bleeding problems from Plavix No abdominal pain No back pain No neck pain  BP 128/82   Pulse 80   Resp 20   Ht 5' 8"  (1.727 m)   Wt 192 lb (87.1 kg)   SpO2 99% Comment: RA  BMI 29.19 kg/m  Physical Exam      Exam    General- alert and comfortable.  Sternal incision well-healed.  Exquisite tenderness to palpation over the upper 2 sternal wires appears to trigger the patient's pain complaints.    Neck- no JVD, no cervical adenopathy palpable, no carotid bruit   Lungs- clear without rales, wheezes   Cor- regular rate and rhythm, no murmur , gallop   Abdomen- soft, non-tender   Extremities - warm, non-tender, minimal edema   Neuro- oriented, appropriate, no focal weakness  Diagnostic Tests: CT scan from 6 to 8 weeks ago reviewed showing sternal healing intact.  Wires intact.  Impression: Neuropathic pain of the sternal incision, severe in this  young patient who had an emergency sternotomy, prolonged period open sternum for VA ECMO cannulas and then subsequent delayed closure of the sternotomy 6 months ago.  Removal of the tube very tender upper sternal wires has a reasonable chance of helping this unfortunate patient so this will be scheduled on December 21 as an outpatient.  He will stop his Plavix and the Vascepa which could be a source of bleeding.  Plan: Return for outpatient surgery, removal of upper sternal wires for persistent neuropathic pain.  At the time of surgery we will also inject the site with Marcaine to help reduce the pain trigger at that site.   Len Childs, MD [facility] [phone]

## 2020-05-10 ENCOUNTER — Other Ambulatory Visit: Payer: Self-pay

## 2020-05-10 ENCOUNTER — Encounter
Payer: BLUE CROSS/BLUE SHIELD | Attending: Physical Medicine and Rehabilitation | Admitting: Physical Medicine and Rehabilitation

## 2020-05-10 ENCOUNTER — Encounter: Payer: Self-pay | Admitting: Physical Medicine and Rehabilitation

## 2020-05-10 VITALS — BP 126/81 | HR 70 | Temp 98.2°F | Ht 68.0 in | Wt 192.6 lb

## 2020-05-10 DIAGNOSIS — M792 Neuralgia and neuritis, unspecified: Secondary | ICD-10-CM | POA: Diagnosis not present

## 2020-05-10 DIAGNOSIS — R0789 Other chest pain: Secondary | ICD-10-CM | POA: Insufficient documentation

## 2020-05-10 MED ORDER — LIDOCAINE 5 % EX PTCH: 1.0000 | MEDICATED_PATCH | CUTANEOUS | 0 refills | Status: DC

## 2020-05-10 NOTE — Progress Notes (Signed)
Subjective:    Patient ID: William Crawford, male    DOB: Oct 22, 1970, 49 y.o.   MRN: [id number]  HPI William Crawford is a 49 year old man who presents with chest pain since he had open heart surgery.  He has pain every day.  The pain is unbearable.  He takes oxycodone and gabapentin. The Gabapentin does not help much. He takes 657m three times per day and 147moxycodone twice per day as needed. The most he has every taken was three.   Average pain is 10/10, pain is 8/10, pain is sharp, burning, and stabbing.  Pain Inventory Average Pain 10 Pain Right Now 8 My pain is sharp, burning and stabbing  In the last 24 hours, has pain interfered with the following? General activity 10 Relation with others 7 Enjoyment of life 6 What TIME of day is your pain at its worst? morning , daytime, evening and night Sleep (in general) Fair  Pain is worse with: . Pain improves with: medication Relief from Meds: 9  walk without assistance how many minutes can you walk? 30 ability to climb steps?  yes do you drive?  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 Heart attack Father    Social History   Socioeconomic History  . Marital status: Single    Spouse name: Not on file  . Number of children: Not on file  . Years of education: Not on file  . Highest education level: Not on file  Occupational History  . Not on file  Tobacco Use  . Smoking status: Former Smoker    Quit date: 10/23/2019    Years since quitting: 0.5  . Smokeless tobacco: Never Used  Substance and Sexual Activity  . Alcohol use: Yes  . Drug use: No  . Sexual activity: Not on file  Other Topics Concern  . Not on file  Social History Narrative  . Not on file   Social Determinants of Health   Financial Resource Strain: Not on file  Food Insecurity: Not on file  Transportation Needs: Not on file  Physical  Activity: Not on file  Stress: Not on file  Social Connections: Not on file   Past Surgical History:  Procedure Laterality Date  . APPLICATION OF WOUND VAC  10/25/2019   Procedure: Application Of Wound Vac;  Surgeon: VaIvin PootMD;  [place] [facility] Service: Open Heart Surgery;;  . CANNULATION FOR ECMO (EXTRACORPOREAL MEMBRANE OXYGENATION)  10/25/2019   Procedure: Cannulation For Ecmo (Extracorporeal Membrane Oxygenation) @ 155916 Surgeon: VaIvin PootMD;  [place] [facility] Service: Open Heart Surgery;;  . CANNULATION FOR ECMO (EXTRACORPOREAL MEMBRANE OXYGENATION) N/A 10/30/2019   Procedure: DECANNULATION OF ECMO (EXTRACORPOREAL MEMBRANE OXYGENATION);  Surgeon: VaPrescott GumPeCollier SalinaMD;  [place] Service: Open Heart Surgery;  Laterality: N/A;  Pump standby  . CHEST EXPLORATION  10/25/2019   Procedure: Chest Exploration for repair of coronary perforation (repair of LAD);  Surgeon: VaPrescott GumPeCollier SalinaMD;  [place] [facility] Service: Open Heart Surgery;;  . CORONARY CTO INTERVENTION N/A 10/25/2019   Procedure: CORONARY CTO INTERVENTION;  Surgeon: JoMartiniquePeter M, MD;  [facility];  Service: Cardiovascular;  Laterality: N/A;  . CORONARY STENT INTERVENTION N/A 10/17/2019   Procedure: CORONARY STENT INTERVENTION;  Surgeon: CoSherren MochaMD;  Location: [place] [facility];  Service: Cardiovascular;  Laterality: N/A;  . LEFT HEART CATH AND CORONARY ANGIOGRAPHY N/A 10/17/2019   Procedure: LEFT HEART CATH AND CORONARY ANGIOGRAPHY;  Surgeon: Sherren Mocha, MD;  Location: [place] [facility];  Service: Cardiovascular;  Laterality: N/A;  . MEDIASTERNOTOMY  10/25/2019   Procedure: Median Sternotomy.;  Surgeon: [name], MD;  Location: [place];  Service: Open Heart Surgery;;  . PLACEMENT OF IMPELLA LEFT VENTRICULAR ASSIST DEVICE  10/30/2019   Procedure: Placement Of Impella Left Ventricular Assist Device 5.5 USING 10MM HEMASHIELD PLATINUM GRAFT;  Surgeon: [name], MD;   Location: [place];  Service: Open Heart Surgery;;  graft placed into aorta  . REMOVAL OF IMPELLA LEFT VENTRICULAR ASSIST DEVICE N/A 11/03/2019   Procedure: REMOVAL OF IMPELLA 5.5 LEFT VENTRICULAR ASSIST DEVICE;  Surgeon: [name], MD;  Location: [place];  Service: Open Heart Surgery;  Laterality: N/A;  . STERNAL CLOSURE N/A 10/30/2019   Procedure: Sternal Closure;  Surgeon: [name], MD;  Location: [place];  Service: Open Heart Surgery;  Laterality: N/A;  . TEE WITHOUT CARDIOVERSION N/A 10/25/2019   Procedure: TRANSESOPHAGEAL ECHOCARDIOGRAM (TEE);  Surgeon: Prescott Gum, Collier Salina, MD;  Location: [place];  Service: Open Heart Surgery;  Laterality: N/A;  . TEE WITHOUT CARDIOVERSION N/A 10/30/2019   Procedure: TRANSESOPHAGEAL ECHOCARDIOGRAM (TEE);  Surgeon: Prescott Gum, Collier Salina, MD;  Location: [place];  Service: Open Heart Surgery;  Laterality: N/A;  . TEE WITHOUT CARDIOVERSION N/A 11/03/2019   Procedure: TRANSESOPHAGEAL ECHOCARDIOGRAM (TEE);  Surgeon: Prescott Gum, Collier Salina, MD;  Location: [place];  Service: Open Heart Surgery;  Laterality: N/A;   Past Medical History:  Diagnosis Date  . Alcohol abuse   . CAD (coronary artery disease)   . Cardiac arrest ([place])   . Cardiogenic shock ([place])   . GERD (gastroesophageal reflux disease)   . HFrEF (heart failure with reduced ejection fraction) ([place])   . Hypertriglyceridemia   . Ischemic cardiomyopathy   . Right atrial thrombus   . Tobacco abuse    BP 126/81   Pulse 70   Temp 98.2 F (36.8 C)   Ht 5' 8"  (1.727 m)   Wt 192 lb 9.6 oz (87.4 kg)   SpO2 98%   BMI 29.28 kg/m   Opioid Risk Score:   Fall Risk Score:  `1  Depression screen PHQ 2/9  Depression screen PHQ 2/9 05/10/2020  Decreased Interest 0  Down, Depressed, Hopeless 0  PHQ - 2 Score 0  Altered sleeping 1  Tired, decreased energy 1  Change in appetite 0  Feeling bad or failure about yourself  0  Trouble concentrating 0  Moving slowly or fidgety/restless 0  Suicidal thoughts 0  PHQ-9 Score 2   Difficult doing work/chores Somewhat difficult    Review of Systems  Musculoskeletal:       Shoulder pain  All other systems reviewed and are negative.      Objective:   Physical Exam Gen: no distress, normal appearing HEENT: oral mucosa pink and moist, NCAT Cardio: Reg rate Chest: normal effort, normal rate of breathing Abd: soft, non-distended Ext: no edema Skin: intact Neuro: Alert and oriented x3.  Musculoskeletal: Tenderness to palpation over chest.  Psych: pleasant, normal affect    Assessment & Plan:  William Crawford is a 49 year old man who presents to establish care for chronic chest pain since he had open heart surgery.   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  -Has never had any function -Dr. Prescott Gum plans to do repeat surgery. -Has tried Gabapentin before.  -Discussed the side effects of opioid medications.  -UDS obtained today.  -Discussed blue emu oil and lidocaine patches.  -Discussed current symptoms of pain and history of pain.  -Discussed benefits of exercise in reducing pain. -Discussed following foods that may reduce pain: 1) Ginger 2) Blueberries 3) Salmon 4) Pumpkin seeds 5) dark chocolate 6) turmeric 7) tart cherries 8) virgin olive oil 9) chilli peppers 10) mint  2) Left shoulder rotator cuff impingement -Can consider lidocaine patch, blue emu oil.

## 2020-05-10 NOTE — Patient Instructions (Signed)
-  Discussed following foods that may reduce pain: 1) Ginger 2) Blueberries 3) Salmon 4) Pumpkin seeds 5) dark chocolate 6) turmeric 7) tart cherries 8) virgin olive oil 9) chilli peppers 10) mint

## 2020-05-13 ENCOUNTER — Other Ambulatory Visit (HOSPITAL_COMMUNITY)
Admission: RE | Admit: 2020-05-13 | Discharge: 2020-05-13 | Disposition: A | Discharge status: Home / Self Care | Payer: BLUE CROSS/BLUE SHIELD | Source: Ambulatory Visit | Attending: Cardiothoracic Surgery | Admitting: Cardiothoracic Surgery

## 2020-05-13 ENCOUNTER — Encounter (HOSPITAL_COMMUNITY): Payer: Self-pay

## 2020-05-13 ENCOUNTER — Ambulatory Visit (HOSPITAL_COMMUNITY)
Admission: RE | Admit: 2020-05-13 | Discharge: 2020-05-13 | Disposition: A | Discharge status: Home / Self Care | Payer: BLUE CROSS/BLUE SHIELD | Source: Ambulatory Visit | Attending: Cardiothoracic Surgery | Admitting: Cardiothoracic Surgery

## 2020-05-13 ENCOUNTER — Other Ambulatory Visit: Payer: Self-pay

## 2020-05-13 ENCOUNTER — Encounter (HOSPITAL_COMMUNITY)
Admission: RE | Admit: 2020-05-13 | Discharge: 2020-05-13 | Disposition: A | Discharge status: Home / Self Care | Payer: BLUE CROSS/BLUE SHIELD | Source: Ambulatory Visit | Attending: Cardiothoracic Surgery | Admitting: Cardiothoracic Surgery

## 2020-05-13 ENCOUNTER — Other Ambulatory Visit (HOSPITAL_COMMUNITY): Payer: Self-pay | Admitting: Cardiology

## 2020-05-13 DIAGNOSIS — T8189XS Other complications of procedures, not elsewhere classified, sequela: Secondary | ICD-10-CM | POA: Diagnosis not present

## 2020-05-13 DIAGNOSIS — I4510 Unspecified right bundle-branch block: Secondary | ICD-10-CM | POA: Diagnosis not present

## 2020-05-13 DIAGNOSIS — I252 Old myocardial infarction: Secondary | ICD-10-CM | POA: Insufficient documentation

## 2020-05-13 DIAGNOSIS — Z01818 Encounter for other preprocedural examination: Secondary | ICD-10-CM

## 2020-05-13 DIAGNOSIS — Z955 Presence of coronary angioplasty implant and graft: Secondary | ICD-10-CM | POA: Insufficient documentation

## 2020-05-13 DIAGNOSIS — X58XXXS Exposure to other specified factors, sequela: Secondary | ICD-10-CM | POA: Diagnosis not present

## 2020-05-13 DIAGNOSIS — I2510 Atherosclerotic heart disease of native coronary artery without angina pectoris: Secondary | ICD-10-CM | POA: Diagnosis not present

## 2020-05-13 DIAGNOSIS — Z87891 Personal history of nicotine dependence: Secondary | ICD-10-CM | POA: Diagnosis not present

## 2020-05-13 DIAGNOSIS — X58XXXA Exposure to other specified factors, initial encounter: Secondary | ICD-10-CM | POA: Diagnosis not present

## 2020-05-13 DIAGNOSIS — Z20822 Contact with and (suspected) exposure to covid-19: Secondary | ICD-10-CM | POA: Diagnosis not present

## 2020-05-13 DIAGNOSIS — I10 Essential (primary) hypertension: Secondary | ICD-10-CM | POA: Diagnosis not present

## 2020-05-13 DIAGNOSIS — E781 Pure hyperglyceridemia: Secondary | ICD-10-CM | POA: Diagnosis not present

## 2020-05-13 DIAGNOSIS — T82847A Pain from cardiac prosthetic devices, implants and grafts, initial encounter: Secondary | ICD-10-CM | POA: Diagnosis present

## 2020-05-13 DIAGNOSIS — Z8674 Personal history of sudden cardiac arrest: Secondary | ICD-10-CM | POA: Diagnosis not present

## 2020-05-13 DIAGNOSIS — Z0389 Encounter for observation for other suspected diseases and conditions ruled out: Secondary | ICD-10-CM | POA: Diagnosis not present

## 2020-05-13 HISTORY — DX: Cardiac arrhythmia, unspecified: I49.9

## 2020-05-13 HISTORY — DX: Anxiety disorder, unspecified: F41.9

## 2020-05-13 HISTORY — DX: Essential (primary) hypertension: I10

## 2020-05-13 HISTORY — DX: Cough, unspecified: R05.9

## 2020-05-13 LAB — COMPREHENSIVE METABOLIC PANEL
ALT: 28 U/L (ref 0–44)
AST: 19 U/L (ref 15–41)
Albumin: 4.4 g/dL (ref 3.5–5.0)
Alkaline Phosphatase: 77 U/L (ref 38–126)
Anion gap: 11 (ref 5–15)
BUN: 10 mg/dL (ref 6–20)
CO2: 30 mmol/L (ref 22–32)
Calcium: 9.1 mg/dL (ref 8.9–10.3)
Chloride: 96 mmol/L — ABNORMAL LOW (ref 98–111)
Creatinine, Ser: 1.2 mg/dL (ref 0.61–1.24)
GFR, Estimated: >60 mL/min (ref >60)
Glucose, Bld: 101 mg/dL — ABNORMAL HIGH (ref 70–99)
Potassium: 3.7 mmol/L (ref 3.5–5.1)
Sodium: 137 mmol/L (ref 135–145)
Total Bilirubin: 0.8 mg/dL (ref 0.3–1.2)
Total Protein: 7.2 g/dL (ref 6.5–8.1)

## 2020-05-13 LAB — SARS CORONAVIRUS 2 (TAT 6-24 HRS): SARS Coronavirus 2: NEGATIVE

## 2020-05-13 LAB — APTT: aPTT: 29 seconds (ref 24–36)

## 2020-05-13 LAB — CBC
HCT: 38.5 % — ABNORMAL LOW (ref 39.0–52.0)
Hemoglobin: 12.6 g/dL — ABNORMAL LOW (ref 13.0–17.0)
MCH: 30.2 pg (ref 26.0–34.0)
MCHC: 32.7 g/dL (ref 30.0–36.0)
MCV: 92.3 fL (ref 80.0–100.0)
Platelets: 266 10*3/uL (ref 150–400)
RBC: 4.17 MIL/uL — ABNORMAL LOW (ref 4.22–5.81)
RDW: 12.1 % (ref 11.5–15.5)
WBC: 7.3 10*3/uL (ref 4.0–10.5)
nRBC: 0 % (ref 0.0–0.2)

## 2020-05-13 LAB — TYPE AND SCREEN
ABO/RH(D): A POS
Antibody Screen: NEGATIVE

## 2020-05-13 LAB — PROTIME-INR
INR: 1 (ref 0.8–1.2)
Prothrombin Time: 12.7 seconds (ref 11.4–15.2)

## 2020-05-13 LAB — SURGICAL PCR SCREEN
MRSA, PCR: NEGATIVE
Staphylococcus aureus: NEGATIVE

## 2020-05-13 NOTE — Anesthesia Preprocedure Evaluation (Addendum)
Anesthesia Evaluation  Patient identified by MRN, date of birth, ID band  Reviewed: Allergy & Precautions, NPO status , Patient's Chart, lab work & pertinent test results  Airway Mallampati: II  TM Distance: >3 FB     Dental   Pulmonary former smoker,    breath sounds clear to auscultation       Cardiovascular hypertension, + angina + CAD and + Past MI  + dysrhythmias  Rhythm:Regular Rate:Normal     Neuro/Psych PSYCHIATRIC DISORDERS Anxiety    GI/Hepatic Neg liver ROS, GERD  ,  Endo/Other  negative endocrine ROS  Renal/GU negative Renal ROS     Musculoskeletal   Abdominal   Peds  Hematology   Anesthesia Other Findings   Reproductive/Obstetrics                            Anesthesia Physical Anesthesia Plan  ASA: III  Anesthesia Plan: General   Post-op Pain Management:    Induction: Intravenous  PONV Risk Score and Plan: 2 and Ondansetron, Dexamethasone and Midazolam  Airway Management Planned: Oral ETT  Additional Equipment:   Intra-op Plan:   Post-operative Plan: Extubation in OR and Possible Post-op intubation/ventilation  Informed Consent: I have reviewed the patients History and Physical, chart, labs and discussed the procedure including the risks, benefits and alternatives for the proposed anesthesia with the patient or authorized representative who has indicated his/her understanding and acceptance.     Dental advisory given  Plan Discussed with: Anesthesiologist and CRNA  Anesthesia Plan Comments: (See PAT note written 05/13/2020 by Myra Gianotti, PA-C. )       Anesthesia Quick Evaluation

## 2020-05-13 NOTE — Pre-Procedure Instructions (Signed)
William Crawford  05/13/2020      [facility] [place], [place] [address] Phone: [phone] Fax: [phone]    Your procedure is scheduled on 05/14/20.  Report to [facility] Admitting at 730 A.M.  Call this number if you have problems the morning of surgery:  [phone]   Remember:  Do not eat or drink after midnight.   Take these medicines the morning of surgery with A SIP OF WATER ----ZYRTEC,FLONAE,NEURONTIN,OXYCODONE    Do not wear jewelry, make-up or nail polish.  Do not wear lotions, powders, or perfumes, or deodorant.  Do not shave 48 hours prior to surgery.  Men may shave face and neck.  Do not bring valuables to the hospital.  [facility] is not responsible for any belongings or valuables.  Contacts, dentures or bridgework may not be worn into surgery.  Leave your suitcase in the car.  After surgery it may be brought to your room.  For patients admitted to the hospital, discharge time will be determined by your treatment team.  Patients discharged the day of surgery will not be allowed to drive home.   Do not take any aspirin,anti-inflammatories,vitamins,or herbal supplements 5-7 days prior to surgery. Special instructions:   Please read over the following fact sheets that you were given. Coughing and Deep Breathing [place] - Preparing for Surgery  Before surgery, you can play an important role.  Because skin is not sterile, your skin needs to be as free of germs as possible.  You can reduce the number of germs on you skin by washing with CHG (chlorahexidine gluconate) soap before surgery.  CHG is an antiseptic cleaner which kills germs and bonds with the skin to continue killing germs even after washing.  Oral Hygiene is also important in reducing the risk of infection.  Remember to brush your teeth with your regular toothpaste the  morning of surgery.  Please DO NOT use if you have an allergy to CHG or antibacterial soaps.  If your skin becomes reddened/irritated stop using the CHG and inform your nurse when you arrive at Short Stay.  Do not shave (including legs and underarms) for at least 48 hours prior to the first CHG shower.  You may shave your face.  Please follow these instructions carefully:   1.  Shower with CHG Soap the night before surgery and the morning of Surgery.  2.  If you choose to wash your hair, wash your hair first as usual with your normal shampoo.  3.  After you shampoo, rinse your hair and body thoroughly to remove the shampoo. 4.  Use CHG as you would any other liquid soap.  You can apply chg directly to the skin and wash gently with a      scrungie or washcloth.           5.  Apply the CHG Soap to your body ONLY FROM THE NECK DOWN.   Do not use on open wounds or open sores. Avoid contact with your eyes, ears, mouth and genitals (private parts).  Wash genitals (private parts) with your normal soap.  6.  Wash thoroughly, paying special attention to the area where your surgery will be performed.  7.  Thoroughly rinse your body with warm water from the neck down.  8.  DO NOT shower/wash with your normal soap after using  and rinsing off the CHG Soap.  9.  Pat yourself dry with a clean towel.            10.  Wear clean pajamas.            11.  Place clean sheets on your bed the night of your first shower and do not sleep with pets.  Day of Surgery  Do not apply any lotions/deoderants the morning of surgery.   Please wear clean clothes to the hospital/surgery center. Remember to brush your teeth with toothpaste.

## 2020-05-13 NOTE — Progress Notes (Signed)
Anesthesia Chart Review:  Case: [id number] Date/Time: 05/14/20 0715   Procedure: STERNAL WIRES REMOVAL (N/A )   Anesthesia type: General   Pre-op diagnosis: PAINFUL STERNAL WIRES   Location: [place] OR ROOM 69 / [place] OR   Surgeons: [name], MD      DISCUSSION: Patient is a 49 year old male scheduled for the above procedure.  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 Elevated TSH 03/18/20.    - Hospitalization 10/17/19-10/18/19 for NSTEMI 10/17/19, s/p DES mRCA with chronic occlusion of proximal LAD, collateralized from left coronary artery, chronic occlusion of small OM1, collateralized from left coronary artery. DAPT x 12 months recommended with consideration of staged CTO-PCI of LAD. Discharged home with plans to return for CTO PCI of LAD on 10/25/19. - Hospitalization 10/25/19-11/13/19 after her developed a large perforation during attempted CTO PCI of the LAD. Prlonged balloon inflations with placement of 2 covered stents were done and CT surgery consulted for emergent CABG. He underwent emergent median sternotomy and placement on cardiopulmonary bypass, drainage of pericardial, repair of perforation of the left anterior descending calcaneus coronary intervention catheter cannulation for venoarterial extracorporeal membrane oxygenation, and placement of wound VAC by Dr. Prescott Gum and Dr. Julien Girt on 10/25/2019. Heart Failure team consulted. He remained stable on  ECMO and milrinone, and he went back to the OR on 10/30/2019 for decannulation of ECMO and placement of an Impella LVAD, which was removed on 11/03/19 as he showed continued improvement. He was on warfarin for right atrial mural thrombus related to ECMO cannula and on Plavix for recent RCA DES. He was weaned from milrinone by discharge.    He is > 6 months out from DES. He is having pain at his sternal incision. Dr. Prescott Gum recommended removal of upper sternal wires with plan to also inject Marcaine at the site. He advised holding Plavix for 5 days prior to surgery. Vascepa also held.   05/13/20 preoperative COVID-19 test and CXR are in process.  Anesthesia team to evaluate on the day of surgery.   VS: BP 115/83   Pulse 78   Temp 36.6 C (Oral)   Resp 19   Ht 5' 8"  (1.727 m)   Wt 87.2 kg   SpO2 100%   BMI 29.24 kg/m     PROVIDERS: [name], MD is PCP - Sherren Mocha, MD is cardiologist. Last visit with Kathyrn Drown, NP on 04/11/20. Carvedilol had been stopped due to bradycardia with presyncopal symptoms.  Loralie Champagne, MD is HF cardiologist. Last visit 02/20/20. Three month follow-up planned. Leeroy Cha, MD is Physical Medicine & Rehabilitation Medicine. Seen 05/10/20 for chronic pain syndrome secondary to chronic chest pain following open heart surgery.   LABS: Labs reviewed: Acceptable for surgery. (all labs ordered are listed, but only abnormal results are displayed)  Labs Reviewed  COMPREHENSIVE METABOLIC PANEL - Abnormal; Notable for the following components:      Result Value   Chloride 96 (*)    Glucose, Bld 101 (*)    All other components within normal limits  CBC -  Abnormal; Notable for the following components:   RBC 4.17 (*)    Hemoglobin 12.6 (*)    HCT 38.5 (*)    All other components within normal limits  SURGICAL PCR SCREEN  PROTIME-INR  APTT  TYPE AND SCREEN     IMAGES: CXR 05/13/20: In process.  CTA Chest 03/18/20: IMPRESSION: 1.  Focal 1.7 cm outpouching of the distal ascending thoracic aorta is suspicious for a pseudoaneurysm related to ECMO catheter placement. Surgical consultation is recommended. 2. No evidence for an acute pulmonary embolism. - Per 05/08/20 note by Dr. Prescott Gum, "A possible pseudoaneurysm of the distal ascending aorta is in fact a segment of a 10 mm Hemashield graft placed for the Impella 5.5 catheter."   EKG:  EKG 05/13/20:  Normal sinus rhythm Possible Left atrial enlargement Right bundle branch block Abnormal ECG  EKG 03/18/20: Sinus bradycardia at 58 bpm Right bundle branch block Abnormal ECG   CV: Echo 03/19/20: IMPRESSIONS  1. Left ventricular ejection fraction, by estimation, is 50 to 55%. The  left ventricle has low normal function. The left ventricle demonstrates  regional wall motion abnormalities (see scoring diagram/findings for  description). There is mild hypokinesis  of the left ventricular, mid-apical anteroseptal wall.  2. The right ventricular size is normal.  3. The mitral valve is normal in structure. Trivial mitral valve  regurgitation.  4. The inferior vena cava is normal in size with <50% respiratory  variability, suggesting right atrial pressure of 8 mmHg.  (Comparison: EF 35-40%, severe inferolateral hypokinesis, hypokinesis mid-apical inferior; 10/17/19; EF 45% 10/25/19; EF < 20%, severe akinesis of the LV, mid-apical anteroseptal wall, lateral wall, anterior wall, inferior wall, and apical segment 10/26/19; EF 20-25% 10/29/19; EF 25-30% 10/31/19; EF 30-35% 11/01/19; EF 40-45% 11/02/19; 02/20/20: EF 55%, mild anteroseptal hypokinesis)   Cardiac cath 10/25/19:  Mid LAD lesion is 100% stenosed.  1st Mrg lesion is 100% stenosed.  Mid Cx to Dist Cx lesion is 50% stenosed.  Prox RCA to Mid RCA lesion is 35% stenosed.  A covered stent placed using a STENT PK PAPYRUS 2.5X20. and a 2.5 x 15 mm PK PAPYRUS with unsuccessful sealing of the perforation.   1.  Unsuccessful CTO PCI of the LAD. Able to cross the lesion with a wire. With initial balloon inflation with a 2.0 mm balloon the patient developed a large perforation. We were unable to seal the perforation with prolonged balloon inflations and placement of Covered stents x 2. Balloon left inflated in the vessel to tamponade the perforation until definitive surgery done. Plan emergent surgery for oversewing of the perforation.   Cardiac cath 10/17/19: 1.  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  Normal LVEDP  Recommendations: Guideline directed medical therapy, DAPT with aspirin and ticagrelor at least 12 months without interruption, consider elective staged CTO-PCI of the LAD.   Past Medical History:  Diagnosis Date  . Alcohol abuse   . Anxiety   . CAD (coronary artery disease)   . Cardiac arrest ([place])   . Cardiogenic shock ([place])   . Cough   . Dysrhythmia   . GERD (gastroesophageal reflux disease)   . HFrEF (heart failure with reduced ejection  fraction) ([place])   . Hypertension   . Hypertriglyceridemia   . Ischemic cardiomyopathy   . Right atrial thrombus   . Tobacco abuse     Past Surgical History:  Procedure Laterality Date  . APPLICATION OF WOUND VAC  10/25/2019   Procedure: Application Of Wound Vac;  Surgeon: [name], MD;  Location: [place];  Service: Open Heart Surgery;;  . CANNULATION FOR ECMO (EXTRACORPOREAL MEMBRANE OXYGENATION)  10/25/2019   Procedure: Cannulation For Ecmo (Extracorporeal Membrane Oxygenation) @ 6759;  Surgeon: [name], MD;  Location: [place];  Service: Open Heart Surgery;;  . CANNULATION FOR ECMO (EXTRACORPOREAL  MEMBRANE OXYGENATION) N/A 10/30/2019   Procedure: DECANNULATION OF ECMO (EXTRACORPOREAL MEMBRANE OXYGENATION);  Surgeon: Prescott Gum, Collier Salina, MD;  Location: [place];  Service: Open Heart Surgery;  Laterality: N/A;  Pump standby  . CHEST EXPLORATION  10/25/2019   Procedure: Chest Exploration for repair of coronary perforation (repair of LAD);  Surgeon: Prescott Gum, Collier Salina, MD;  Location: [place];  Service: Open Heart Surgery;;  . CORONARY CTO INTERVENTION N/A 10/25/2019   Procedure: CORONARY CTO INTERVENTION;  Surgeon: Martinique, Peter M, MD;  Location: [place] CV LAB;  Service: Cardiovascular;  Laterality: N/A;  . CORONARY STENT INTERVENTION N/A 10/17/2019   Procedure: CORONARY STENT INTERVENTION;  Surgeon: Sherren Mocha, MD;  Location: [place] CV LAB;  Service: Cardiovascular;  Laterality: N/A;  . LEFT HEART CATH AND CORONARY ANGIOGRAPHY N/A 10/17/2019   Procedure: LEFT HEART CATH AND CORONARY ANGIOGRAPHY;  Surgeon: Sherren Mocha, MD;  Location: [place] CV LAB;  Service: Cardiovascular;  Laterality: N/A;  . MEDIASTERNOTOMY  10/25/2019   Procedure: Median Sternotomy.;  Surgeon: [name], MD;  Location: [place];  Service: Open Heart Surgery;;  . PLACEMENT OF IMPELLA LEFT VENTRICULAR ASSIST DEVICE  10/30/2019   Procedure: Placement Of Impella Left Ventricular Assist Device 5.5 USING 10MM HEMASHIELD PLATINUM GRAFT;  Surgeon: [name], MD;  Location: [place];  Service: Open Heart Surgery;;  graft placed into aorta  . REMOVAL OF IMPELLA LEFT VENTRICULAR ASSIST DEVICE N/A 11/03/2019   Procedure: REMOVAL OF IMPELLA 5.5 LEFT VENTRICULAR ASSIST DEVICE;  Surgeon: [name], MD;  Location: [place];  Service: Open Heart Surgery;  Laterality: N/A;  . STERNAL CLOSURE N/A 10/30/2019   Procedure: Sternal Closure;  Surgeon: [name], MD;  Location: [place];  Service: Open Heart Surgery;  Laterality: N/A;  . TEE WITHOUT CARDIOVERSION N/A 10/25/2019   Procedure: TRANSESOPHAGEAL ECHOCARDIOGRAM (TEE);   Surgeon: Prescott Gum, Collier Salina, MD;  Location: [place];  Service: Open Heart Surgery;  Laterality: N/A;  . TEE WITHOUT CARDIOVERSION N/A 10/30/2019   Procedure: TRANSESOPHAGEAL ECHOCARDIOGRAM (TEE);  Surgeon: Prescott Gum, Collier Salina, MD;  Location: [place];  Service: Open Heart Surgery;  Laterality: N/A;  . TEE WITHOUT CARDIOVERSION N/A 11/03/2019   Procedure: TRANSESOPHAGEAL ECHOCARDIOGRAM (TEE);  Surgeon: Prescott Gum, Collier Salina, MD;  Location: [place];  Service: Open Heart Surgery;  Laterality: N/A;    MEDICATIONS: . aspirin EC 81 MG tablet  . atorvastatin (LIPITOR) 40 MG tablet  . carvedilol (COREG) 6.25 MG tablet  . cetirizine (ZYRTEC) 10 MG tablet  . clopidogrel (PLAVIX) 75 MG tablet  . fluticasone (FLONASE) 50 MCG/ACT nasal spray  . gabapentin (NEURONTIN) 300 MG capsule  . gabapentin (NEURONTIN) 600 MG tablet  . icosapent Ethyl (VASCEPA) 1 g capsule  . lidocaine (LIDODERM) 5 %  . Oxycodone HCl 10 MG TABS  . sacubitril-valsartan (ENTRESTO) 49-51 MG  . spironolactone (  ALDACTONE) 25 MG tablet  . thiamine 100 MG tablet  . torsemide (DEMADEX) 20 MG tablet  . Turmeric 500 MG CAPS   No current facility-administered medications for this encounter.   He is not currently taking Coreg due to bradycardia. Plavix and Vascepa are on hold for procedure.    Myra Gianotti, PA-C Surgical Short Stay/Anesthesiology [facility] Phone [phone] [facility] Phone [phone] 05/13/2020 3:37 PM

## 2020-05-13 NOTE — Progress Notes (Signed)
PCP - [place] Cardiologist - DR [facility]  -  -  Chest x-ray - 05/13/20 EKG - 05/13/20 Stress Test - NA ECHO - 10/21 Cardiac Cath -  6/21  -  6/21 CABG   -  Blood Thinner Instructions:OFF PLAVIX Aspirin Instructions:STOP  COVID TEST- HAD TODAY 05/13/20   Anesthesia review: HEART HX  Patient denies shortness of breath, fever, cough and chest pain at PAT appointment   All instructions explained to the patient, with a verbal understanding of the material. Patient agrees to go over the instructions while at home for a better understanding. Patient also instructed to self quarantine after being tested for COVID-19. The opportunity to ask questions was provided.

## 2020-05-14 ENCOUNTER — Encounter (HOSPITAL_COMMUNITY): Payer: Self-pay | Admitting: Cardiothoracic Surgery

## 2020-05-14 ENCOUNTER — Ambulatory Visit (HOSPITAL_COMMUNITY): Payer: BLUE CROSS/BLUE SHIELD

## 2020-05-14 ENCOUNTER — Encounter (HOSPITAL_COMMUNITY)
Admission: RE | Disposition: A | Discharge status: Home / Self Care | Payer: Self-pay | Source: Home / Self Care | Attending: Cardiothoracic Surgery

## 2020-05-14 ENCOUNTER — Ambulatory Visit (HOSPITAL_COMMUNITY)
Admission: RE | Admit: 2020-05-14 | Discharge: 2020-05-14 | Disposition: A | Discharge status: Home / Self Care | Payer: BLUE CROSS/BLUE SHIELD | Attending: Cardiothoracic Surgery | Admitting: Cardiothoracic Surgery

## 2020-05-14 ENCOUNTER — Ambulatory Visit (HOSPITAL_COMMUNITY): Payer: BLUE CROSS/BLUE SHIELD | Admitting: Certified Registered Nurse Anesthetist

## 2020-05-14 ENCOUNTER — Ambulatory Visit (HOSPITAL_COMMUNITY): Payer: BLUE CROSS/BLUE SHIELD | Admitting: Vascular Surgery

## 2020-05-14 ENCOUNTER — Other Ambulatory Visit: Payer: Self-pay | Admitting: Cardiothoracic Surgery

## 2020-05-14 DIAGNOSIS — Z955 Presence of coronary angioplasty implant and graft: Secondary | ICD-10-CM | POA: Insufficient documentation

## 2020-05-14 DIAGNOSIS — Z8674 Personal history of sudden cardiac arrest: Secondary | ICD-10-CM | POA: Diagnosis not present

## 2020-05-14 DIAGNOSIS — T8189XA Other complications of procedures, not elsewhere classified, initial encounter: Secondary | ICD-10-CM

## 2020-05-14 DIAGNOSIS — X58XXXA Exposure to other specified factors, initial encounter: Secondary | ICD-10-CM | POA: Diagnosis not present

## 2020-05-14 DIAGNOSIS — T8484XA Pain due to internal orthopedic prosthetic devices, implants and grafts, initial encounter: Secondary | ICD-10-CM | POA: Diagnosis not present

## 2020-05-14 DIAGNOSIS — T82847A Pain from cardiac prosthetic devices, implants and grafts, initial encounter: Secondary | ICD-10-CM | POA: Insufficient documentation

## 2020-05-14 DIAGNOSIS — I214 Non-ST elevation (NSTEMI) myocardial infarction: Secondary | ICD-10-CM | POA: Diagnosis not present

## 2020-05-14 DIAGNOSIS — Z20822 Contact with and (suspected) exposure to covid-19: Secondary | ICD-10-CM | POA: Insufficient documentation

## 2020-05-14 DIAGNOSIS — Z87891 Personal history of nicotine dependence: Secondary | ICD-10-CM | POA: Insufficient documentation

## 2020-05-14 DIAGNOSIS — T8189XS Other complications of procedures, not elsewhere classified, sequela: Secondary | ICD-10-CM

## 2020-05-14 DIAGNOSIS — R079 Chest pain, unspecified: Secondary | ICD-10-CM | POA: Diagnosis not present

## 2020-05-14 DIAGNOSIS — I10 Essential (primary) hypertension: Secondary | ICD-10-CM | POA: Diagnosis not present

## 2020-05-14 DIAGNOSIS — I2510 Atherosclerotic heart disease of native coronary artery without angina pectoris: Secondary | ICD-10-CM | POA: Diagnosis not present

## 2020-05-14 DIAGNOSIS — T85898A Other specified complication of other internal prosthetic devices, implants and grafts, initial encounter: Secondary | ICD-10-CM | POA: Diagnosis not present

## 2020-05-14 HISTORY — PX: STERNAL WIRES REMOVAL: SHX2441

## 2020-05-14 LAB — GLUCOSE, CAPILLARY: Glucose-Capillary: 121 mg/dL — ABNORMAL HIGH (ref 70–99)

## 2020-05-14 SURGERY — REMOVAL, STERNAL WIRE
Anesthesia: General | Site: Chest

## 2020-05-14 MED ORDER — BUPIVACAINE HCL (PF) 0.5 % IJ SOLN
INTRAMUSCULAR | Status: AC
  Filled 2020-05-14: qty 30

## 2020-05-14 MED ORDER — MIDAZOLAM HCL 5 MG/5ML IJ SOLN
INTRAMUSCULAR | Status: DC | PRN
  Administered 2020-05-14: 2 mg via INTRAVENOUS

## 2020-05-14 MED ORDER — LACTATED RINGERS IV SOLN: INTRAVENOUS | Status: DC | PRN

## 2020-05-14 MED ORDER — PROPOFOL 10 MG/ML IV BOLUS
INTRAVENOUS | Status: DC | PRN
  Administered 2020-05-14: 150 mg via INTRAVENOUS

## 2020-05-14 MED ORDER — FENTANYL CITRATE (PF) 100 MCG/2ML IJ SOLN
INTRAMUSCULAR | Status: AC
  Filled 2020-05-14: qty 2

## 2020-05-14 MED ORDER — BUPIVACAINE HCL (PF) 0.75 % IJ SOLN
INTRAMUSCULAR | Status: AC
  Filled 2020-05-14: qty 10

## 2020-05-14 MED ORDER — PROPOFOL 10 MG/ML IV BOLUS
INTRAVENOUS | Status: AC
  Filled 2020-05-14: qty 20

## 2020-05-14 MED ORDER — BUPIVACAINE HCL (PF) 0.25 % IJ SOLN
INTRAMUSCULAR | Status: AC
  Filled 2020-05-14: qty 30

## 2020-05-14 MED ORDER — SODIUM CHLORIDE 0.9 % IV SOLN: 250.0000 mL | INTRAVENOUS | Status: DC | PRN

## 2020-05-14 MED ORDER — SODIUM CHLORIDE 0.9% FLUSH: 3.0000 mL | Freq: Two times a day (BID) | INTRAVENOUS | Status: DC

## 2020-05-14 MED ORDER — ACETAMINOPHEN 500 MG PO TABS: 1000.0000 mg | ORAL_TABLET | Freq: Four times a day (QID) | ORAL | Status: DC

## 2020-05-14 MED ORDER — ROCURONIUM BROMIDE 10 MG/ML (PF) SYRINGE
PREFILLED_SYRINGE | INTRAVENOUS | Status: AC
  Filled 2020-05-14: qty 10

## 2020-05-14 MED ORDER — ACETAMINOPHEN 650 MG RE SUPP: 650.0000 mg | RECTAL | Status: DC | PRN

## 2020-05-14 MED ORDER — FENTANYL CITRATE (PF) 250 MCG/5ML IJ SOLN
INTRAMUSCULAR | Status: DC | PRN
  Administered 2020-05-14: 25 ug via INTRAVENOUS

## 2020-05-14 MED ORDER — LIDOCAINE 2% (20 MG/ML) 5 ML SYRINGE
INTRAMUSCULAR | Status: AC
  Filled 2020-05-14: qty 5

## 2020-05-14 MED ORDER — DEXAMETHASONE SODIUM PHOSPHATE 10 MG/ML IJ SOLN
INTRAMUSCULAR | Status: DC | PRN
  Administered 2020-05-14: 5 mg via INTRAVENOUS

## 2020-05-14 MED ORDER — SODIUM CHLORIDE 0.9% FLUSH: 3.0000 mL | INTRAVENOUS | Status: DC | PRN

## 2020-05-14 MED ORDER — OXYCODONE HCL 5 MG PO CAPS: 10.0000 mg | ORAL_CAPSULE | Freq: Four times a day (QID) | ORAL | 0 refills | Status: DC | PRN

## 2020-05-14 MED ORDER — CEFAZOLIN SODIUM-DEXTROSE 2-4 GM/100ML-% IV SOLN
INTRAVENOUS | Status: AC
  Filled 2020-05-14: qty 100

## 2020-05-14 MED ORDER — ONDANSETRON HCL 4 MG/2ML IJ SOLN
INTRAMUSCULAR | Status: DC | PRN
  Administered 2020-05-14: 4 mg via INTRAVENOUS

## 2020-05-14 MED ORDER — CHLORHEXIDINE GLUCONATE 0.12 % MT SOLN
OROMUCOSAL | Status: AC
  Administered 2020-05-14: 06:00:00 15 mL via OROMUCOSAL
  Filled 2020-05-14: qty 15

## 2020-05-14 MED ORDER — CHLORHEXIDINE GLUCONATE 0.12 % MT SOLN: 15.0000 mL | Freq: Once | OROMUCOSAL | Status: AC

## 2020-05-14 MED ORDER — LACTATED RINGERS IV SOLN: INTRAVENOUS | Status: DC

## 2020-05-14 MED ORDER — KETOROLAC TROMETHAMINE 30 MG/ML IJ SOLN
INTRAMUSCULAR | Status: DC | PRN
  Administered 2020-05-14: 30 mg via INTRAVENOUS

## 2020-05-14 MED ORDER — SODIUM CHLORIDE 0.9 % IR SOLN
Status: DC | PRN
  Administered 2020-05-14: 2000 mL

## 2020-05-14 MED ORDER — VANCOMYCIN HCL 1000 MG IV SOLR
INTRAVENOUS | Status: AC
  Filled 2020-05-14: qty 1000

## 2020-05-14 MED ORDER — ACETAMINOPHEN 325 MG PO TABS: 650.0000 mg | ORAL_TABLET | ORAL | Status: DC | PRN

## 2020-05-14 MED ORDER — FENTANYL CITRATE (PF) 100 MCG/2ML IJ SOLN
25.0000 ug | INTRAMUSCULAR | Status: DC | PRN
  Administered 2020-05-14: 50 ug via INTRAVENOUS

## 2020-05-14 MED ORDER — PHENYLEPHRINE HCL-NACL 10-0.9 MG/250ML-% IV SOLN
INTRAVENOUS | Status: DC | PRN
  Administered 2020-05-14: 50 ug/min via INTRAVENOUS

## 2020-05-14 MED ORDER — LIDOCAINE 2% (20 MG/ML) 5 ML SYRINGE
INTRAMUSCULAR | Status: DC | PRN
  Administered 2020-05-14: 100 mg via INTRAVENOUS

## 2020-05-14 MED ORDER — ALBUMIN HUMAN 5 % IV SOLN: INTRAVENOUS | Status: DC | PRN

## 2020-05-14 MED ORDER — ORAL CARE MOUTH RINSE: 15.0000 mL | Freq: Once | OROMUCOSAL | Status: AC

## 2020-05-14 MED ORDER — OXYCODONE HCL 5 MG PO TABS
5.0000 mg | ORAL_TABLET | ORAL | Status: DC | PRN
  Administered 2020-05-14: 10 mg via ORAL

## 2020-05-14 MED ORDER — ONDANSETRON HCL 4 MG/2ML IJ SOLN
INTRAMUSCULAR | Status: AC
  Filled 2020-05-14: qty 2

## 2020-05-14 MED ORDER — FENTANYL CITRATE (PF) 250 MCG/5ML IJ SOLN
INTRAMUSCULAR | Status: AC
  Filled 2020-05-14: qty 5

## 2020-05-14 MED ORDER — OXYCODONE HCL 10 MG PO TABS: 10.0000 mg | ORAL_TABLET | Freq: Two times a day (BID) | ORAL | Status: DC | PRN

## 2020-05-14 MED ORDER — CEFAZOLIN SODIUM-DEXTROSE 2-4 GM/100ML-% IV SOLN
2.0000 g | INTRAVENOUS | Status: AC
  Administered 2020-05-14: 09:00:00 2 g via INTRAVENOUS

## 2020-05-14 MED ORDER — MIDAZOLAM HCL 2 MG/2ML IJ SOLN
INTRAMUSCULAR | Status: AC
  Filled 2020-05-14: qty 2

## 2020-05-14 MED ORDER — PHENYLEPHRINE 40 MCG/ML (10ML) SYRINGE FOR IV PUSH (FOR BLOOD PRESSURE SUPPORT)
PREFILLED_SYRINGE | INTRAVENOUS | Status: DC | PRN
  Administered 2020-05-14: 80 ug via INTRAVENOUS
  Administered 2020-05-14 (×2): 120 ug via INTRAVENOUS
  Administered 2020-05-14 (×3): 80 ug via INTRAVENOUS

## 2020-05-14 MED ORDER — EPHEDRINE SULFATE-NACL 50-0.9 MG/10ML-% IV SOSY
PREFILLED_SYRINGE | INTRAVENOUS | Status: DC | PRN
  Administered 2020-05-14: 5 mg via INTRAVENOUS

## 2020-05-14 MED ORDER — DEXAMETHASONE SODIUM PHOSPHATE 10 MG/ML IJ SOLN
INTRAMUSCULAR | Status: AC
  Filled 2020-05-14: qty 1

## 2020-05-14 MED ORDER — OXYCODONE HCL 5 MG PO TABS
ORAL_TABLET | ORAL | Status: AC
  Filled 2020-05-14: qty 2

## 2020-05-14 MED ORDER — BUPIVACAINE HCL 0.25 % IJ SOLN
INTRAMUSCULAR | Status: DC | PRN
  Administered 2020-05-14: 10 mL

## 2020-05-14 SURGICAL SUPPLY — 60 items
ATTRACTOMAT 16X20 MAGNETIC DRP (DRAPES) ×3 IMPLANT
BAG DECANTER FOR FLEXI CONT (MISCELLANEOUS) IMPLANT
BENZOIN TINCTURE PRP APPL 2/3 (GAUZE/BANDAGES/DRESSINGS) IMPLANT
BLADE CLIPPER SURG (BLADE) ×3 IMPLANT
BLADE SURG 10 STRL SS (BLADE) ×6 IMPLANT
CANISTER SUCT 3000ML PPV (MISCELLANEOUS) ×3 IMPLANT
CLIP VESOCCLUDE SM WIDE 24/CT (CLIP) IMPLANT
CNTNR URN SCR LID CUP LEK RST (MISCELLANEOUS) IMPLANT
CONT SPEC 4OZ STRL OR WHT (MISCELLANEOUS)
DRAPE LAPAROSCOPIC ABDOMINAL (DRAPES) ×3 IMPLANT
DRSG AQUACEL AG ADV 3.5X14 (GAUZE/BANDAGES/DRESSINGS) IMPLANT
DRSG TEGADERM 4X4.75 (GAUZE/BANDAGES/DRESSINGS) ×6 IMPLANT
ELECT REM PT RETURN 9FT ADLT (ELECTROSURGICAL) ×3
ELECTRODE REM PT RTRN 9FT ADLT (ELECTROSURGICAL) ×1 IMPLANT
FELT TEFLON 1X6 (MISCELLANEOUS) ×3 IMPLANT
GAUZE SPONGE 2X2 8PLY STRL LF (GAUZE/BANDAGES/DRESSINGS) ×1 IMPLANT
GAUZE SPONGE 4X4 12PLY STRL (GAUZE/BANDAGES/DRESSINGS) ×3 IMPLANT
GAUZE SPONGE 4X4 12PLY STRL LF (GAUZE/BANDAGES/DRESSINGS) ×3 IMPLANT
GAUZE XEROFORM 1X8 LF (GAUZE/BANDAGES/DRESSINGS) ×3 IMPLANT
GAUZE XEROFORM 5X9 LF (GAUZE/BANDAGES/DRESSINGS) IMPLANT
GLOVE BIO SURGEON STRL SZ 6.5 (GLOVE) ×4 IMPLANT
GLOVE BIO SURGEON STRL SZ7.5 (GLOVE) ×3 IMPLANT
GLOVE BIO SURGEONS STRL SZ 6.5 (GLOVE) ×2
GLOVE SURG UNDER POLY LF SZ6.5 (GLOVE) ×3 IMPLANT
GOWN STRL REUS W/ TWL LRG LVL3 (GOWN DISPOSABLE) ×4 IMPLANT
GOWN STRL REUS W/TWL LRG LVL3 (GOWN DISPOSABLE) ×12
HEMOSTAT POWDER SURGIFOAM 1G (HEMOSTASIS) IMPLANT
HEMOSTAT SURGICEL 2X14 (HEMOSTASIS) IMPLANT
KIT BASIN OR (CUSTOM PROCEDURE TRAY) ×3 IMPLANT
KIT TURNOVER KIT B (KITS) ×3 IMPLANT
MARKER SKIN DUAL TIP RULER LAB (MISCELLANEOUS) IMPLANT
NEEDLE HYPO 22GX1.5 SAFETY (NEEDLE) ×3 IMPLANT
NS IRRIG 1000ML POUR BTL (IV SOLUTION) ×6 IMPLANT
PACK CHEST (CUSTOM PROCEDURE TRAY) ×3 IMPLANT
PAD ARMBOARD 7.5X6 YLW CONV (MISCELLANEOUS) ×6 IMPLANT
SOL PREP POV-IOD 4OZ 10% (MISCELLANEOUS) IMPLANT
SPONGE GAUZE 2X2 STER 10/PKG (GAUZE/BANDAGES/DRESSINGS) ×2
SPONGE LAP 18X18 RF (DISPOSABLE) ×3 IMPLANT
SUT ETHIBOND 2 0 SH (SUTURE) ×3
SUT ETHIBOND 2 0 SH 36X2 (SUTURE) ×1 IMPLANT
SUT ETHILON 3 0 FSL (SUTURE) IMPLANT
SUT ETHILON 3 0 PS 1 (SUTURE) ×6 IMPLANT
SUT STEEL 6MS V (SUTURE) IMPLANT
SUT STEEL STERNAL CCS#1 18IN (SUTURE) IMPLANT
SUT STEEL SZ 6 DBL 3X14 BALL (SUTURE) IMPLANT
SUT VIC AB 1 CTX 36 (SUTURE)
SUT VIC AB 1 CTX36XBRD ANBCTR (SUTURE) IMPLANT
SUT VIC AB 2-0 CT1 27 (SUTURE) ×3
SUT VIC AB 2-0 CT1 TAPERPNT 27 (SUTURE) ×1 IMPLANT
SUT VIC AB 2-0 CTX 27 (SUTURE) IMPLANT
SUT VIC AB 3-0 SH 8-18 (SUTURE) ×6 IMPLANT
SUT VIC AB 3-0 X1 27 (SUTURE) ×3 IMPLANT
SWAB COLLECTION DEVICE MRSA (MISCELLANEOUS) IMPLANT
SWAB CULTURE ESWAB REG 1ML (MISCELLANEOUS) IMPLANT
SYR BULB IRRIG 60ML STRL (SYRINGE) ×3 IMPLANT
SYR CONTROL 10ML LL (SYRINGE) ×3 IMPLANT
TAPE CLOTH SURG 4X10 WHT LF (GAUZE/BANDAGES/DRESSINGS) ×3 IMPLANT
TOWEL GREEN STERILE (TOWEL DISPOSABLE) ×3 IMPLANT
TOWEL GREEN STERILE FF (TOWEL DISPOSABLE) ×3 IMPLANT
WATER STERILE IRR 1000ML POUR (IV SOLUTION) ×3 IMPLANT

## 2020-05-14 NOTE — Discharge Instructions (Signed)
Keep dressing dry     48 hrs No driving 48 hrs Office will call for suture removal in early Jan Postop pain med called in to pharmacy

## 2020-05-14 NOTE — OR Nursing (Signed)
Pt is awake,alert and oriented. Pt is in NAD at this time. Pt and family verbalized understanding of poc and discharge instructions. instructions given to family and reviewed prior to discharge.Pt is ambulatory to wheelchair with stand by assist but not needed with steady gait.  Pt taken to front lobby and placed in car with family.

## 2020-05-14 NOTE — Anesthesia Postprocedure Evaluation (Signed)
Anesthesia Post Note  Patient: William Crawford  Procedure(s) Performed: STERNAL WIRES REMOVAL (N/A Chest)     Patient location during evaluation: PACU Anesthesia Type: General Level of consciousness: awake Pain management: pain level controlled Vital Signs Assessment: post-procedure vital signs reviewed and stable Respiratory status: spontaneous breathing Cardiovascular status: stable Postop Assessment: no apparent nausea or vomiting Anesthetic complications: no   No complications documented.  Last Vitals:  Vitals:   05/14/20 0602 05/14/20 0925  BP: 119/71   Pulse: 65   Resp: 17 12  Temp: 36.6 C (!) 36 C  SpO2: 96%     Last Pain:  Vitals:   05/14/20 0925  TempSrc:   PainSc: Asleep                 Sharley Keeler

## 2020-05-14 NOTE — H&P (Signed)
PCP is [name], MD Referring Provider is No ref. provider found  No chief complaint on file.   HPI: Patient returns for follow-up of persistent neuropathic sternal pain after emergency sternotomy, repair of ruptured LAD from PCI with tamponade, cannulation for ECMO, sternal closure after decannulation for ECMO with placement of a Impella 5.5 direct aortic percutaneous LVAD.  This all happened 6 months ago.  The sternal incision has healed and a CT scan of the chest 6 weeks ago showed good sternal healing and no aortic disease.  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  Patient still has exquisite hypersensitivity and neuropathic pain.  He is trying to work as a Curator.  He has been weaned off narcotics and he takes Neurontin 600 mg 3 times daily without benefit.  He has an appointment to be seen in a pain clinic shortly.  Exam today the patient has exquisite tenderness over 2 of the upper sternal wires which are easily palpable and seem to trigger his discomfort.  At 6 months after surgery the integrity of the sternal closure is now complete and the sternal wires are no longer needed.  I have offered removal of the sternal wires under anesthesia as an outpatient procedure to the patient to help reduce his neuropathic pain and he agrees.  He will stop his Plavix and we will set him up for surgery as an outpatient in 5 days.  Otherwise his cardiac function is good and he has no cardiac symptoms.  Ejection fraction is 50-55% by last echo.  Past Medical History:  Diagnosis Date  . Alcohol abuse   . Anxiety   . CAD (coronary artery disease)   . Cardiac arrest ([place])   . Cardiogenic shock ([place])   . Cough   . Dysrhythmia   . GERD (gastroesophageal reflux disease)   . HFrEF (heart failure with reduced ejection fraction) ([place])   . Hypertension   . Hypertriglyceridemia   . Ischemic  cardiomyopathy   . Right atrial thrombus   . Tobacco abuse     Past Surgical History:  Procedure Laterality Date  . APPLICATION OF WOUND VAC  10/25/2019   Procedure: Application Of Wound Vac;  Surgeon: [name], MD;  Location: [place];  Service: Open Heart Surgery;;  . CANNULATION FOR ECMO (EXTRACORPOREAL MEMBRANE OXYGENATION)  10/25/2019   Procedure: Cannulation For Ecmo (Extracorporeal Membrane Oxygenation) @ 2446;  Surgeon: [name], MD;  Location: [place];  Service: Open Heart Surgery;;  . CANNULATION FOR ECMO (EXTRACORPOREAL MEMBRANE OXYGENATION) N/A 10/30/2019   Procedure: DECANNULATION OF ECMO (EXTRACORPOREAL MEMBRANE OXYGENATION);  Surgeon: Prescott Gum, Collier Salina, MD;  Location: [place];  Service: Open Heart Surgery;  Laterality: N/A;  Pump standby  . CHEST EXPLORATION  10/25/2019   Procedure: Chest Exploration for repair of coronary perforation (repair of LAD);  Surgeon: Prescott Gum, Collier Salina, MD;  Location: [place];  Service: Open Heart Surgery;;  . CORONARY CTO INTERVENTION N/A 10/25/2019   Procedure: CORONARY CTO INTERVENTION;  Surgeon: Martinique, Kavan Devan M, MD;  Location: [place] CV LAB;  Service: Cardiovascular;  Laterality: N/A;  . CORONARY STENT INTERVENTION N/A 10/17/2019   Procedure: CORONARY STENT INTERVENTION;  Surgeon: Sherren Mocha, MD;  Location: [place] CV LAB;  Service: Cardiovascular;  Laterality: N/A;  . LEFT HEART CATH AND CORONARY ANGIOGRAPHY N/A 10/17/2019   Procedure: LEFT HEART CATH AND CORONARY ANGIOGRAPHY;  Surgeon: Sherren Mocha, MD;  Location: [place] CV LAB;  Service: Cardiovascular;  Laterality: N/A;  . MEDIASTERNOTOMY  10/25/2019   Procedure: Median Sternotomy.;  Surgeon: [name], MD;  Location: [place];  Service: Open Heart Surgery;;  . PLACEMENT OF IMPELLA LEFT VENTRICULAR ASSIST DEVICE  10/30/2019   Procedure: Placement Of Impella Left Ventricular Assist Device 5.5 USING 10MM HEMASHIELD PLATINUM GRAFT;  Surgeon: [name], MD;  Location: [place];   Service: Open Heart Surgery;;  graft placed into aorta  . REMOVAL OF IMPELLA LEFT VENTRICULAR ASSIST DEVICE N/A 11/03/2019   Procedure: REMOVAL OF IMPELLA 5.5 LEFT VENTRICULAR ASSIST DEVICE;  Surgeon: [name], MD;  Location: [place];  Service: Open Heart Surgery;  Laterality: N/A;  . STERNAL CLOSURE N/A 10/30/2019   Procedure: Sternal Closure;  Surgeon: [name], MD;  Location: [place];  Service: Open Heart Surgery;  Laterality: N/A;  . TEE WITHOUT CARDIOVERSION N/A 10/25/2019   Procedure: TRANSESOPHAGEAL ECHOCARDIOGRAM (TEE);  Surgeon: Prescott Gum, Collier Salina, MD;  Location: [place];  Service: Open Heart Surgery;  Laterality: N/A;  . TEE WITHOUT CARDIOVERSION N/A 10/30/2019   Procedure: TRANSESOPHAGEAL ECHOCARDIOGRAM (TEE);  Surgeon: Prescott Gum, Collier Salina, MD;  Location: [place];  Service: Open Heart Surgery;  Laterality: N/A;  . TEE WITHOUT CARDIOVERSION N/A 11/03/2019   Procedure: TRANSESOPHAGEAL ECHOCARDIOGRAM (TEE);  Surgeon: Prescott Gum, Collier Salina, MD;  Location: [place];  Service: Open Heart Surgery;  Laterality: N/A;    Family History  Problem Relation Age of Onset  . Heart attack Father     Social History Social History   Tobacco Use  . Smoking status: Former Smoker    Quit date: 10/23/2019    Years since quitting: 0.5  . Smokeless tobacco: Never Used  Vaping Use  . Vaping Use: Never used  Substance Use Topics  . Alcohol use: Yes    Comment: QUIT  IN MAY 89373  . Drug use: No    Current Facility-Administered Medications  Medication Dose Route Frequency Provider Last Rate Last Admin  . ceFAZolin (ANCEF) IVPB 2g/100 mL premix  2 g Intravenous 30 min Pre-Op Prescott Gum, Collier Salina, MD      . lactated ringers infusion   Intravenous Continuous Belinda Block, MD      . sodium chloride irrigation 0.9 %    PRN Prescott Gum, Collier Salina, MD   2,000 mL at 05/14/20 4287   Facility-Administered Medications Ordered in Other Encounters  Medication Dose Route Frequency Provider Last Rate Last Admin  . lactated  ringers infusion   Intravenous Continuous PRN Janene Harvey, CRNA   New Bag at 05/14/20 [phone]  . lactated ringers infusion   Intravenous Continuous PRN Janene Harvey, CRNA   New Bag at 05/14/20 0720    No Known Allergies  Review of Systems  Weight stable No fever No symptoms of Covid No bleeding problems from Plavix No abdominal pain No back pain No neck pain  BP 119/71   Pulse 65   Temp 97.8 F (36.6 C) (Oral)   Resp 17   Ht 5' 8"  (1.727 m)   Wt 87.2 kg   SpO2 96%   BMI 29.23 kg/m  Physical Exam      Exam    General- alert and comfortable.  Sternal incision well-healed.  Exquisite tenderness to palpation over the upper 2 sternal wires appears to trigger the patient's pain complaints.    Neck- no JVD, no cervical adenopathy palpable, no carotid bruit  Lungs- clear without rales, wheezes   Cor- regular rate and rhythm, no murmur , gallop   Abdomen- soft, non-tender   Extremities - warm, non-tender, minimal edema   Neuro- oriented, appropriate, no focal weakness  Diagnostic Tests: CT scan from 6 to 8 weeks ago reviewed showing sternal healing intact.  Wires intact.  Impression: Neuropathic pain of the sternal incision, severe in this young patient who had an emergency sternotomy, prolonged period open sternum for VA ECMO cannulas and then subsequent delayed closure of the sternotomy 6 months ago.  Removal of the tube very tender upper sternal wires has a reasonable chance of helping this unfortunate patient so this will be scheduled on December 21 as an outpatient.  He will stop his Plavix and the Vascepa which could be a source of bleeding.  Plan: Return for outpatient surgery, removal of upper sternal wires for persistent neuropathic pain.  At the time of surgery we will also inject the site with Marcaine to help reduce the pain trigger at that site.   Len Childs, MD [facility] [phone]  Pre Procedure note for  inpatients:   William Crawford has been scheduled for Procedure(s): STERNAL WIRES REMOVAL (N/A) today. The various methods of treatment have been discussed with the patient. After consideration of the risks, benefits and treatment options the patient has consented to the planned procedure.   The patient has been seen and labs reviewed. There are no changes in the patient's condition to prevent proceeding with the planned procedure today.  Recent labs:  Lab Results  Component Value Date   WBC 7.3 05/13/2020   HGB 12.6 (L) 05/13/2020   HCT 38.5 (L) 05/13/2020   PLT 266 05/13/2020   GLUCOSE 101 (H) 05/13/2020   CHOL 154 01/05/2020   TRIG 248 (H) 01/05/2020   HDL 37 (L) 01/05/2020   LDLCALC 67 01/05/2020   ALT 28 05/13/2020   AST 19 05/13/2020   NA 137 05/13/2020   K 3.7 05/13/2020   CL 96 (L) 05/13/2020   CREATININE 1.20 05/13/2020   BUN 10 05/13/2020   CO2 30 05/13/2020   TSH 4.584 (H) 03/18/2020   INR 1.0 05/13/2020   HGBA1C 5.2 10/18/2019    Len Childs, MD 05/14/2020 8:20 AM

## 2020-05-14 NOTE — Brief Op Note (Signed)
05/14/2020  9:18 AM  PATIENT:  William Crawford  49 y.o. male  PRE-OPERATIVE DIAGNOSIS:  PAINFUL STERNAL WIRES  POST-OPERATIVE DIAGNOSIS:  PAINFUL STERNAL WIRES  PROCEDURE:  Procedure(s): STERNAL WIRES REMOVAL (N/A)  SURGEON:  Surgeon(s) and Role:    [name], MD - Primary  PHYSICIAN ASSISTANT:   ASSISTANTS: none   ANESTHESIA:   general  EBL:  10 mL   BLOOD ADMINISTERED:none  DRAINS: none   LOCAL MEDICATIONS USED:  MARCAINE  0.25% 7 cc    SPECIMEN:  No Specimen  DISPOSITION OF SPECIMEN:  N/A  COUNTS:  YES  TOURNIQUET:  * No tourniquets in log *  DICTATION: .Dragon Dictation  PLAN OF CARE: Discharge to home after PACU  PATIENT DISPOSITION:  PACU - hemodynamically stable.   Delay start of Pharmacological VTE agent (>24hrs) due to surgical blood loss or risk of bleeding: yes

## 2020-05-14 NOTE — Progress Notes (Signed)
Wasted 50 mcg fentanyl after pt discharged home.   Witnesses Orie Fisherman and Wellsite geologist.

## 2020-05-14 NOTE — Transfer of Care (Signed)
Immediate Anesthesia Transfer of Care Note  Patient: William Crawford  Procedure(s) Performed: STERNAL WIRES REMOVAL (N/A Chest)  Patient Location: PACU  Anesthesia Type:General  Level of Consciousness: drowsy  Airway & Oxygen Therapy: Patient Spontanous Breathing and Patient connected to face mask oxygen  Post-op Assessment: Report given to RN and Post -op Vital signs reviewed and stable  Post vital signs: Reviewed  Last Vitals:  Vitals Value Taken Time  BP 114/73 05/14/20 0925  Temp    Pulse 64 05/14/20 0929  Resp 10 05/14/20 0929  SpO2 100 % 05/14/20 0929  Vitals shown include unvalidated device data.  Last Pain:  Vitals:   05/14/20 0622  TempSrc:   PainSc: 0-No pain      Patients Stated Pain Goal: 3 (78/46/96 2952)  Complications: No complications documented.

## 2020-05-14 NOTE — Anesthesia Procedure Notes (Signed)
Procedure Name: LMA Insertion Date/Time: 05/14/2020 8:34 AM Performed by: Janene Harvey, CRNA Pre-anesthesia Checklist: Patient identified, Emergency Drugs available, Suction available and Patient being monitored Patient Re-evaluated:Patient Re-evaluated prior to induction Oxygen Delivery Method: Circle system utilized Preoxygenation: Pre-oxygenation with 100% oxygen Induction Type: IV induction LMA: LMA inserted LMA Size: 4.0 Placement Confirmation: positive ETCO2 and breath sounds checked- equal and bilateral Dental Injury: Teeth and Oropharynx as per pre-operative assessment

## 2020-05-15 ENCOUNTER — Encounter (HOSPITAL_COMMUNITY): Payer: Self-pay | Admitting: Cardiothoracic Surgery

## 2020-05-15 LAB — COLOGUARD: COLOGUARD: NEGATIVE

## 2020-05-15 LAB — EXTERNAL GENERIC LAB PROCEDURE: COLOGUARD: NEGATIVE

## 2020-05-15 NOTE — Op Note (Signed)
NAME: William Crawford, William Crawford MEDICAL RECORD [id number] ACCOUNT [id number] DATE OF BIRTH:Aug 03, 1970 FACILITY: [facility] LOCATION: [facility] PHYSICIAN:Margaree Sandhu VAN TRIGT III, MD  OPERATIVE REPORT  DATE OF PROCEDURE:  05/14/2020  OPERATION:  Removal of painful sternal wires.  PREOPERATIVE DIAGNOSIS:  Status post emergency sternotomy for tamponade following PCI almost 6 months ago with neuropathic sternal pain.  POSTOPERATIVE DIAGNOSIS:  Status post emergency sternotomy for tamponade following PCI almost 6 months ago with neuropathic sternal pain.  SURGEON:  [name], MD  ANESTHESIA:  General with 0.25% local Marcaine.  DESCRIPTION OF PROCEDURE:  The patient was brought from preoperative holding where informed consent was documented and final issues addressed with the patient.  He was placed supine on the operating table and general anesthesia was induced.  He remained  stable.  The chest was prepped and draped as a sterile field.  A proper time-out was performed.  Two protruding painful sternal wires, which the patient describes been marked prior to surgery were then removed with small incisions.  The sternum itself  was completely healed and fused.  One sternal wire was taken from the mid sternum and 2 sternal wires taken from the manubrium area.  These wounds were irrigated and closed in layers using Vicryl for the subcutaneous layer and nylon for the skin.   Sterile dressings were applied.  The patient was then reversed from anesthesia, extubated and returned to the recovery room in stable condition.  HN/NUANCE  D:05/14/2020 T:05/15/2020 JOB:[id number]

## 2020-05-15 NOTE — Addendum Note (Signed)
Addendum  created 05/15/20 1115 by Janene Harvey, CRNA   Charge Capture section accepted

## 2020-05-16 ENCOUNTER — Telehealth: Payer: Self-pay

## 2020-05-16 NOTE — Telephone Encounter (Signed)
Patient contacted the office requesting advise as to if he needed to remove the bandage from his chest before or after showering.  He is s/p sternal wire removal with Dr. Prescott Gum 05/14/20 and discharged.  Per discharge instruction patient was to keep incision and dressing clean and dry 48 hours after surgery.  Advised patient that he could remove dressing and shower, pat the incision dry and remain open to air.  Patient acknowledged receipt.

## 2020-05-20 ENCOUNTER — Other Ambulatory Visit: Payer: Self-pay

## 2020-05-20 ENCOUNTER — Telehealth: Payer: Self-pay

## 2020-05-20 DIAGNOSIS — G894 Chronic pain syndrome: Secondary | ICD-10-CM

## 2020-05-20 DIAGNOSIS — Z79891 Long term (current) use of opiate analgesic: Secondary | ICD-10-CM

## 2020-05-20 DIAGNOSIS — Z5181 Encounter for therapeutic drug level monitoring: Secondary | ICD-10-CM

## 2020-05-20 MED ORDER — TRAMADOL HCL 50 MG PO TABS: 50.0000 mg | ORAL_TABLET | Freq: Four times a day (QID) | ORAL | 0 refills | Status: DC | PRN

## 2020-05-20 NOTE — Telephone Encounter (Signed)
Patient is requesting pain medication. Had surgery on Tuesday of last week, only given #23 of Oxycodone 10 mg and is out. [facility]

## 2020-05-20 NOTE — Telephone Encounter (Signed)
RX for Tramadol 50 mg 1 every 6 hours prn/pain #28, no refill was approved by Lars Pinks

## 2020-05-20 NOTE — Telephone Encounter (Signed)
William Crawford, I do not see the results of his urine tox back- do you? If you also do not see it, please let him know that our clinic can not prescribe narcotics for him until we get there result of that sample but he can ask his surgeon for a refill in the meantime. Thank you!

## 2020-05-21 ENCOUNTER — Other Ambulatory Visit: Payer: Self-pay

## 2020-05-21 ENCOUNTER — Ambulatory Visit (HOSPITAL_COMMUNITY)
Admission: RE | Admit: 2020-05-21 | Discharge: 2020-05-21 | Disposition: A | Discharge status: Home / Self Care | Payer: BLUE CROSS/BLUE SHIELD | Source: Ambulatory Visit | Attending: Cardiology | Admitting: Cardiology

## 2020-05-21 ENCOUNTER — Encounter (HOSPITAL_COMMUNITY): Payer: Self-pay | Admitting: Cardiology

## 2020-05-21 VITALS — BP 150/100 | HR 84 | Wt 190.8 lb

## 2020-05-21 DIAGNOSIS — I252 Old myocardial infarction: Secondary | ICD-10-CM | POA: Insufficient documentation

## 2020-05-21 DIAGNOSIS — I10 Essential (primary) hypertension: Secondary | ICD-10-CM

## 2020-05-21 DIAGNOSIS — Z87891 Personal history of nicotine dependence: Secondary | ICD-10-CM | POA: Diagnosis not present

## 2020-05-21 DIAGNOSIS — I5022 Chronic systolic (congestive) heart failure: Secondary | ICD-10-CM

## 2020-05-21 DIAGNOSIS — I214 Non-ST elevation (NSTEMI) myocardial infarction: Secondary | ICD-10-CM | POA: Insufficient documentation

## 2020-05-21 DIAGNOSIS — Z5181 Encounter for therapeutic drug level monitoring: Secondary | ICD-10-CM | POA: Diagnosis not present

## 2020-05-21 DIAGNOSIS — E781 Pure hyperglyceridemia: Secondary | ICD-10-CM | POA: Diagnosis not present

## 2020-05-21 DIAGNOSIS — I2510 Atherosclerotic heart disease of native coronary artery without angina pectoris: Secondary | ICD-10-CM | POA: Diagnosis not present

## 2020-05-21 DIAGNOSIS — I110 Hypertensive heart disease with heart failure: Secondary | ICD-10-CM | POA: Insufficient documentation

## 2020-05-21 DIAGNOSIS — Z7901 Long term (current) use of anticoagulants: Secondary | ICD-10-CM | POA: Diagnosis not present

## 2020-05-21 DIAGNOSIS — Z79891 Long term (current) use of opiate analgesic: Secondary | ICD-10-CM | POA: Diagnosis not present

## 2020-05-21 DIAGNOSIS — Z7982 Long term (current) use of aspirin: Secondary | ICD-10-CM | POA: Insufficient documentation

## 2020-05-21 DIAGNOSIS — Z79899 Other long term (current) drug therapy: Secondary | ICD-10-CM | POA: Insufficient documentation

## 2020-05-21 DIAGNOSIS — M170 Bilateral primary osteoarthritis of knee: Secondary | ICD-10-CM | POA: Diagnosis not present

## 2020-05-21 DIAGNOSIS — I255 Ischemic cardiomyopathy: Secondary | ICD-10-CM | POA: Insufficient documentation

## 2020-05-21 DIAGNOSIS — Z8249 Family history of ischemic heart disease and other diseases of the circulatory system: Secondary | ICD-10-CM | POA: Diagnosis not present

## 2020-05-21 DIAGNOSIS — M069 Rheumatoid arthritis, unspecified: Secondary | ICD-10-CM | POA: Insufficient documentation

## 2020-05-21 DIAGNOSIS — F1010 Alcohol abuse, uncomplicated: Secondary | ICD-10-CM | POA: Insufficient documentation

## 2020-05-21 DIAGNOSIS — Z955 Presence of coronary angioplasty implant and graft: Secondary | ICD-10-CM | POA: Diagnosis not present

## 2020-05-21 DIAGNOSIS — G894 Chronic pain syndrome: Secondary | ICD-10-CM | POA: Diagnosis not present

## 2020-05-21 DIAGNOSIS — I469 Cardiac arrest, cause unspecified: Secondary | ICD-10-CM | POA: Diagnosis not present

## 2020-05-21 HISTORY — DX: Heart failure, unspecified: I50.9

## 2020-05-21 MED ORDER — OXYCODONE-ACETAMINOPHEN 5-325 MG PO TABS: 1.0000 | ORAL_TABLET | Freq: Two times a day (BID) | ORAL | 0 refills | Status: DC | PRN

## 2020-05-21 MED ORDER — CARVEDILOL 3.125 MG PO TABS: 3.1250 mg | ORAL_TABLET | Freq: Two times a day (BID) | ORAL | Status: DC

## 2020-05-21 NOTE — Telephone Encounter (Signed)
Thank you so much

## 2020-05-21 NOTE — Telephone Encounter (Signed)
There was not one ordered, apparently.  I will place an order and give him a call. He will need to come in within 24 hours of the request. I called and spoke with him at 2:05 pm.

## 2020-05-21 NOTE — Progress Notes (Signed)
PCP: [name], MD  [facility] Cardiology: Dr. Aundra Dubin  49 y.o. with history of heavy smoking, heavy ETOH, and strong FH of CAD was initially admitted to [facility] on 10/17/19 with chest pain and NSTEMI.  LHC showed 95% distal RCA (culprit) with chronically occluded proximal LAD and OM1 with collaterals.  He had DES to distal RCA initially.  Echo showed EF 35-40% with inferior wall motion abnormality and normal RV.  He was discharged home with plan for CTO intervention on LAD.  He came for elective CTO procedure in 6/21, however he had perforation of the LAD with balloon inflation. A covered stent was placed.  Echo showed EF 45% with apical hypokinesis, moderate pericardial effusion and evidence for tamponade.  He was then taken to the OR for LAD repair and drainage of pericardial effusion.   In the OR, he had a VF arrest with about 15 minutes of CPR.  He was put on cardiopulmonary bypass and the tear in the LAD was repaired.  The LAD was not graftable.  TEE in the OR showed severe biventricular failure, suspect stunning post-VF arrest.  He was unable to come off bypass.  Therefore, he was cannulated for ECMO with venous drainage from RA and arterial return to ascending aorta.  After 4 days, ECMO was decannulated and Impella 5.5 was placed.  4 days after that, Impella was removed. He was eventually weaned off pressors/inotropes and discharged home.  Echo in 11/03/19 showed EF up to 45-50%.    Echo was done in 9/21 with EF 55% with mild anteroseptal hypokinesis and normal RV.   Patient was admitted with presyncope in 10/21.  Workup was unremarkable, thought to be possible vasovagal event.  As HR was around 50 at times, Coreg was stopped. Delene Loll was also decreased. Echo in 10/21 showed EF 50-55% with normal RV. Zio patch showed no significant arrhythmias.   Patient returns for followup of CAD and CHF.  He has had no further lightheadedness or syncope. On Tuesday, he had protruding sternal wires removed.  He  has had significant pain in the sternum since this procedure.  No drainage or erythema. BP is high, he thinks that this is driven by pain.  No significant exertional dyspnea or exertional chest pain.  No orthopnea/PND.     Labs (7/21): K 4.1, creatinine 1.08 Labs (8/21): K 3.9, creatinine 1.04, LDL 67, HDL 37, TGs 248 Labs (12/21): K 3.7, creatinine 1.2  PMH: 1. ETOH abuse 2. Smoking 3. GERD 4. CAD: NSTEMI in 5/21 with 95% distal RCA (culprit) and CTO LAD and OM1.  He had DES to distal RCA.  - He returned in 6/21 for CTO procedure to LAD, complicated by perforation of the LAD.  He was taken to the OR emergently for repair of the perforation.  Unable to graft the LAD.  5. VF arrest: 6/21 at time of LAD perforation/tamponade. 6. Ischemic cardiomyopathy:  - Echo (5/21): EF 35-40%. - Echo (6/21, post arrest): EF < 20%, moderately reduced RV function.  - ECMO in 6/21 post-LAD perforation and arrest.  - Echo (11/03/19): EF 45-50%.  - Echo (9/21): EF 55%, mild anteroseptal hypokinesis, normal RV.  - Echo (10/21): EF 50-55% with normal RV. 7. Right atrial thrombus: Noted post-ECMO.  Warfarin.  8. Presyncopal episode in 10/21: Zio patch (10/21) with no significant arrhythmias noted.   Social History   Socioeconomic History  . Marital status: Single    Spouse name: Not on file  . Number of children:  Not on file  . Years of education: Not on file  . Highest education level: Not on file  Occupational History  . Not on file  Tobacco Use  . Smoking status: Former Smoker    Quit date: 10/23/2019    Years since quitting: 0.5  . Smokeless tobacco: Never Used  Vaping Use  . Vaping Use: Never used  Substance and Sexual Activity  . Alcohol use: Yes    Comment: QUIT  IN MAY 53299  . Drug use: No  . Sexual activity: Not on file  Other Topics Concern  . Not on file  Social History Narrative  . Not on file   Social Determinants of Health   Financial Resource Strain: Not on file  Food  Insecurity: Not on file  Transportation Needs: Not on file  Physical Activity: Not on file  Stress: Not on file  Social Connections: Not on file  Intimate Partner Violence: Not on file   Family History  Problem Relation Age of Onset  . Heart attack Father    ROS: All systems reviewed and negative except as per HPI.   Current Outpatient Medications  Medication Sig Dispense Refill  . aspirin EC 81 MG tablet Take 1 tablet (81 mg total) by mouth daily. Swallow whole. 90 tablet 3  . atorvastatin (LIPITOR) 40 MG tablet Take 1 tablet (40 mg total) by mouth daily. 90 tablet 3  . cetirizine (ZYRTEC) 10 MG tablet Take 10 mg by mouth daily.    . clopidogrel (PLAVIX) 75 MG tablet Take 1 tablet (75 mg total) by mouth daily. 90 tablet 3  . fluticasone (FLONASE) 50 MCG/ACT nasal spray Place 2 sprays into both nostrils daily.    Marland Kitchen gabapentin (NEURONTIN) 300 MG capsule Take 2 capsules (600 mg total) by mouth 3 (three) times daily. 540 capsule 3  . icosapent Ethyl (VASCEPA) 1 g capsule Take 2 capsules (2 g total) by mouth 2 (two) times daily. 120 capsule 5  . oxyCODONE-acetaminophen (PERCOCET) 5-325 MG tablet Take 1 tablet by mouth 2 (two) times daily as needed for up to 10 days for severe pain. 20 tablet 0  . POTASSIUM CHLORIDE ER PO Take by mouth daily.    . sacubitril-valsartan (ENTRESTO) 49-51 MG Take 1 tablet by mouth 2 (two) times daily. 60 tablet 11  . spironolactone (ALDACTONE) 25 MG tablet TAKE 1 TABLET(25 MG) BY MOUTH DAILY 30 tablet 1  . thiamine 100 MG tablet Take 1 tablet (100 mg total) by mouth daily. 30 tablet 1  . torsemide (DEMADEX) 20 MG tablet Take 1 tablet (20 mg total) by mouth every other day. 45 tablet 3  . traMADol (ULTRAM) 50 MG tablet Take 1 tablet (50 mg total) by mouth every 6 (six) hours as needed. 28 tablet 0  . Turmeric 500 MG CAPS Take 500 mg by mouth daily.    . carvedilol (COREG) 3.125 MG tablet Take 1 tablet (3.125 mg total) by mouth 2 (two) times daily with a meal.      No current facility-administered medications for this encounter.   BP (!) 150/100 (BP Location: Left Arm)   Pulse 84   Wt 86.5 kg (190 lb 12.8 oz)   SpO2 99%   BMI 29.01 kg/m  General: NAD Neck: No JVD, no thyromegaly or thyroid nodule.  Lungs: Clear to auscultation bilaterally with normal respiratory effort. CV: Nondisplaced PMI.  Heart regular S1/S2, no S3/S4, no murmur.  No peripheral edema.  No carotid bruit.  Normal pedal pulses.  Abdomen: Soft, nontender, no hepatosplenomegaly, no distention.  Skin: Intact without lesions or rashes.  Neurologic: Alert and oriented x 3.  Psych: Normal affect. Extremities: No clubbing or cyanosis.  HEENT: Normal.   Assessment/Plan: 1. CAD: NSTEMI in 5/21 with DES to distal RCA.  He was left with occluded OM1 and LAD. CTO procedure was attempted to open LAD, but complicated by LAD perforation and tamponade/cardiac arrest requiring trip to OR and [facility] ECMO. The LAD was not graftable, he has no perfusion to the LAD territory except by collaterals.  No chest pain.  - With recovery of EF and no thrombus on echo today, I stopped warfarin and started ASA 81 mg daily.  - Continue atorvastatin, good lipids in 8/21.  2. Cardiac arrest: In OR after LAD perforation/tamponade.  - With recovery of EF, he is off amiodarone.  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  He is on warfarin currently.  - Off warfarin with recovery of EF and no thrombus on last echo.  4. Smoking: He remains abstinent.  5. ETOH abuse: He remains abstinent.  6. Chronic systolic CHF: Ischemic cardiomyopathy.  Echo after cardiac arrest in 6/21 showed EF < 20% with moderate RV dysfunction.  Echo in 6/21 after decannulation of ECMO and removal of Impella 5.5 showed EF up to 45-50%.  Echo in 10/21 with EF 50-55%.  On exam, he is not volume overloaded.  NYHA class I. - With high BP, will restart Coreg 3.125 mg bid.   - Continue Entresto 49/51 bid and  spironolactone 25 mg daily. - He does not need torsemide. - EF recovered, no ICD needed.  7. Hypertriglyceridemia: Continue Vascepa.  8. HTN: BP elevated today, suspect in part due to pain from sternal wound sites.   - As above, will add back Coreg 3.125 mg bid.  - I will also give him a small supply of Percocet to take for the pain until he contacts the surgeon.  - Nursing BP check in 2 wks.   Followup 3 months.   Loralie Champagne 05/21/2020

## 2020-05-21 NOTE — Telephone Encounter (Signed)
Ask William Crawford and she said doesn't look like it was order.

## 2020-05-21 NOTE — Patient Instructions (Addendum)
No Labs done today.   START Carvedilol 3.172m (1 tablet) by mouth 2 times daily.  START Percocet 5-3257m(1 tablet) by mouth 2 times daily as needed for pain.  No other medication changes were made. Please continue all other medications as prescribed.  Your physician recommends that you schedule a follow-up appointment in: 2 weeks for a Blood Pressure Check and in 3 months for an appointment with Dr. McAundra DubinIf you have any questions or concerns before your next appointment please send usKorea message through [facility] call our office at [phone]   TO LEAVE A MESSAGE FOR THE NURSE SELECT OPTION 2, PLEASE LEAVE A MESSAGE INCLUDING: . YOUR NAME . DATE OF BIRTH . CALL BACK NUMBER . REASON FOR CALL**this is important as we prioritize the call backs  [place] LONG AS YOU CALL BEFORE 4:00 PM   At the [facility] and your health needs are our priority. As part of our continuing mission to provide you with exceptional heart care, we have created designated Provider Care Teams. These Care Teams include your primary Cardiologist (physician) and Advanced Practice Providers (APPs- Physician Assistants and Nurse Practitioners) who all work together to provide you with the care you need, when you need it.   You may see any of the following providers on your designated Care Team at your next follow up: . Marland Kitchenr DaGlori Bickers Dr DaLoralie Champagne AmDarrick GrinderNP . BrLyda JesterPA . LaAudry RilesPharmD   Please be sure to bring in all your medications bottles to every appointment.

## 2020-05-21 NOTE — Telephone Encounter (Signed)
Sybil do you know if urine tox was obtained? If not, he can come into clinic for urine testing, please let him know he needs a urine sample before our clinic can prescribe controlled substances for him, thank you!

## 2020-05-22 ENCOUNTER — Other Ambulatory Visit (HOSPITAL_COMMUNITY): Payer: Self-pay | Admitting: *Deleted

## 2020-05-22 MED ORDER — CARVEDILOL 3.125 MG PO TABS: 3.1250 mg | ORAL_TABLET | Freq: Two times a day (BID) | ORAL | 3 refills | Status: DC

## 2020-05-28 ENCOUNTER — Telehealth: Payer: Self-pay | Admitting: *Deleted

## 2020-05-28 LAB — TOXASSURE SELECT,+ANTIDEPR,UR

## 2020-05-28 NOTE — Telephone Encounter (Signed)
Urine drug screen for this encounter is consistent for prescribed medication 

## 2020-05-29 ENCOUNTER — Ambulatory Visit: Payer: Self-pay | Admitting: Cardiothoracic Surgery

## 2020-05-29 ENCOUNTER — Encounter: Payer: Self-pay | Admitting: Cardiothoracic Surgery

## 2020-05-29 ENCOUNTER — Telehealth: Payer: Self-pay

## 2020-05-29 ENCOUNTER — Other Ambulatory Visit: Payer: Self-pay

## 2020-05-29 ENCOUNTER — Other Ambulatory Visit: Payer: Self-pay | Admitting: Physical Medicine and Rehabilitation

## 2020-05-29 VITALS — BP 128/80 | HR 84 | Temp 97.9°F | Resp 20 | Ht 68.0 in | Wt 190.0 lb

## 2020-05-29 DIAGNOSIS — S2220XA Unspecified fracture of sternum, initial encounter for closed fracture: Secondary | ICD-10-CM | POA: Insufficient documentation

## 2020-05-29 DIAGNOSIS — T8189XD Other complications of procedures, not elsewhere classified, subsequent encounter: Secondary | ICD-10-CM

## 2020-05-29 DIAGNOSIS — S2222XD Fracture of body of sternum, subsequent encounter for fracture with routine healing: Secondary | ICD-10-CM

## 2020-05-29 MED ORDER — OXYCODONE-ACETAMINOPHEN 5-325 MG PO TABS: 1.0000 | ORAL_TABLET | Freq: Two times a day (BID) | ORAL | 0 refills | Status: DC | PRN

## 2020-05-29 NOTE — Progress Notes (Signed)
PCP is [name], MD Referring Provider is [name], MD  Chief Complaint  Patient presents with  . Routine Post Op    S/p sternal wire removal 12/21    HPI: Postop visit after removal of painful protruding sternal wires.  Over 7 months ago the patient had emergency sternotomy with associated sternal fracture which was rewired and a delayed closure.  The patient required open chest CPR for cardiac arrest after coronary PCI and tamponade.  The patient's chest was open on ECMO for several days.  He continues to have postoperative neuropathic pain which apparently was triggered by the protruding wires.  After removal of 4 wires his pain is improving.  The patient appreciates the assistance of the pain clinic physician who is helping him recover.  Today I removed the sutures in the skin from the sternal wire removal sites.  Both incisions are healed.  He states the chest neuropathic pain has improved after removal of the wires.  Past Medical History:  Diagnosis Date  . Alcohol abuse   . Anxiety   . CAD (coronary artery disease)   . Cardiac arrest ([place])   . Cardiogenic shock ([place])   . CHF (congestive heart failure) ([place])   . Cough   . Dysrhythmia   . GERD (gastroesophageal reflux disease)   . HFrEF (heart failure with reduced ejection fraction) ([place])   . Hypertension   . Hypertriglyceridemia   . Ischemic cardiomyopathy   . Right atrial thrombus   . Tobacco abuse     Past Surgical History:  Procedure Laterality Date  . APPLICATION OF WOUND VAC  10/25/2019   Procedure: Application Of Wound Vac;  Surgeon: [name], MD;  Location: [place];  Service: Open Heart Surgery;;  . CANNULATION FOR ECMO (EXTRACORPOREAL MEMBRANE OXYGENATION)  10/25/2019   Procedure: Cannulation For Ecmo (Extracorporeal Membrane Oxygenation) @ 2924;  Surgeon: [name], MD;  Location: [place];  Service: Open Heart Surgery;;  . CANNULATION FOR ECMO (EXTRACORPOREAL MEMBRANE OXYGENATION) N/A 10/30/2019    Procedure: DECANNULATION OF ECMO (EXTRACORPOREAL MEMBRANE OXYGENATION);  Surgeon: Prescott Gum, Collier Salina, MD;  Location: [place];  Service: Open Heart Surgery;  Laterality: N/A;  Pump standby  . CHEST EXPLORATION  10/25/2019   Procedure: Chest Exploration for repair of coronary perforation (repair of LAD);  Surgeon: Prescott Gum, Collier Salina, MD;  Location: [place];  Service: Open Heart Surgery;;  . CORONARY CTO INTERVENTION N/A 10/25/2019   Procedure: CORONARY CTO INTERVENTION;  Surgeon: Martin[place], Burhanuddin Kohlmann M, MD;  Location: [place] CV [facility];  Service: Cardiovascular;  Laterality: N/A;  . CORONARY STENT INTERVENTION N/A 10/17/2019   Procedure: CORONARY STENT INTERVENTION;  Surgeon: Sherren Mocha, MD;  Location: [place] [facility];  Service: Cardiovascular;  Laterality: N/A;  . LEFT HEART CATH AND CORONARY ANGIOGRAPHY N/A 10/17/2019   Procedure: LEFT HEART CATH AND CORONARY ANGIOGRAPHY;  Surgeon: Sherren Mocha, MD;  Location: [place] CV [facility];  Service: Cardiovascular;  Laterality: N/A;  . MEDIASTERNOTOMY  10/25/2019   Procedure: Median Sternotomy.;  Surgeon: [name], MD;  Location: [place];  Service: Open Heart Surgery;;  . PLACEMENT OF IMPELLA LEFT VENTRICULAR ASSIST DEVICE  10/30/2019   Procedure: Placement Of Impella Left Ventricular Assist Device 5.5 USING 10MM HEMASHIELD PLATINUM GRAFT;  Surgeon: [name], MD;  Location: [place];  Service: Open Heart Surgery;;  graft placed into aorta  . REMOVAL OF IMPELLA LEFT VENTRICULAR ASSIST DEVICE N/A 11/03/2019   Procedure: REMOVAL OF IMPELLA 5.5  LEFT VENTRICULAR ASSIST DEVICE;  Surgeon: [name], MD;  Location: [place];  Service: Open Heart Surgery;  Laterality: N/A;  . STERNAL CLOSURE N/A 10/30/2019   Procedure: Sternal Closure;  Surgeon: [name], MD;  Location: [place];  Service: Open Heart Surgery;  Laterality: N/A;  . STERNAL WIRES REMOVAL N/A 05/14/2020   Procedure: STERNAL WIRES REMOVAL;  Surgeon: [name], MD;  Location: [place];  Service:  Thoracic;  Laterality: N/A;  . TEE WITHOUT CARDIOVERSION N/A 10/25/2019   Procedure: TRANSESOPHAGEAL ECHOCARDIOGRAM (TEE);  Surgeon: Prescott Gum, Collier Salina, MD;  Location: [place];  Service: Open Heart Surgery;  Laterality: N/A;  . TEE WITHOUT CARDIOVERSION N/A 10/30/2019   Procedure: TRANSESOPHAGEAL ECHOCARDIOGRAM (TEE);  Surgeon: Prescott Gum, Collier Salina, MD;  Location: [place];  Service: Open Heart Surgery;  Laterality: N/A;  . TEE WITHOUT CARDIOVERSION N/A 11/03/2019   Procedure: TRANSESOPHAGEAL ECHOCARDIOGRAM (TEE);  Surgeon: Prescott Gum, Collier Salina, MD;  Location: [place];  Service: Open Heart Surgery;  Laterality: N/A;    Family History  Problem Relation Age of Onset  . Heart attack Father     Social History Social History   Tobacco Use  . Smoking status: Former Smoker    Quit date: 10/23/2019    Years since quitting: 0.6  . Smokeless tobacco: Never Used  Vaping Use  . Vaping Use: Never used  Substance Use Topics  . Alcohol use: Yes    Comment: QUIT  IN MAY 16109  . Drug use: No    Current Outpatient Medications  Medication Sig Dispense Refill  . aspirin EC 81 MG tablet Take 1 tablet (81 mg total) by mouth daily. Swallow whole. 90 tablet 3  . atorvastatin (LIPITOR) 40 MG tablet Take 1 tablet (40 mg total) by mouth daily. 90 tablet 3  . carvedilol (COREG) 3.125 MG tablet Take 1 tablet (3.125 mg total) by mouth 2 (two) times daily with a meal. 60 tablet 3  . cetirizine (ZYRTEC) 10 MG tablet Take 10 mg by mouth daily.    . clopidogrel (PLAVIX) 75 MG tablet Take 1 tablet (75 mg total) by mouth daily. 90 tablet 3  . fluticasone (FLONASE) 50 MCG/ACT nasal spray Place 2 sprays into both nostrils daily.    Marland Kitchen gabapentin (NEURONTIN) 300 MG capsule Take 2 capsules (600 mg total) by mouth 3 (three) times daily. 540 capsule 3  . icosapent Ethyl (VASCEPA) 1 g capsule Take 2 capsules (2 g total) by mouth 2 (two) times daily. 120 capsule 5  . oxyCODONE-acetaminophen (PERCOCET) 5-325 MG tablet Take 1 tablet by mouth 2  (two) times daily as needed for severe pain. 60 tablet 0  . POTASSIUM CHLORIDE ER PO Take by mouth daily.    . sacubitril-valsartan (ENTRESTO) 49-51 MG Take 1 tablet by mouth 2 (two) times daily. 60 tablet 11  . spironolactone (ALDACTONE) 25 MG tablet TAKE 1 TABLET(25 MG) BY MOUTH DAILY 30 tablet 1  . thiamine 100 MG tablet Take 1 tablet (100 mg total) by mouth daily. 30 tablet 1  . torsemide (DEMADEX) 20 MG tablet Take 1 tablet (20 mg total) by mouth every other day. 45 tablet 3  . traMADol (ULTRAM) 50 MG tablet Take 1 tablet (50 mg total) by mouth every 6 (six) hours as needed. 28 tablet 0  . Turmeric 500 MG CAPS Take 500 mg by mouth daily.     No current facility-administered medications for this visit.    No Known Allergies  Review of Systems  Patient is working almost 40 hours a day He is not smoking.  He denies angina or CHF symptoms BP 128/80 (BP Location: Left Arm, Patient Position: Sitting)   Pulse 84   Temp 97.9 F (36.6 C)   Resp 20   Ht 5' 8"  (1.727 m)   Wt 190 lb (86.2 kg)   SpO2 99% Comment: RA with mask on  BMI 28.89 kg/m  Physical Exam Sternal wire sites healed.  Sutures are removed. Heart rhythm regular. No murmur Lungs clear  Diagnostic Tests: None  Impression: Healed sternal incisions after removal of sternal wires  Plan: Patient return as needed.  He is followed closely by the heart failure cardiology clinic.   Len Childs, MD [facility] [phone]

## 2020-05-29 NOTE — Telephone Encounter (Signed)
Yes that would be great!

## 2020-05-29 NOTE — Telephone Encounter (Signed)
-----   Message from [name], MD sent at 05/29/2020  7:55 AM EST ----- Regarding: RE: pain med refill No more oxycodone ----- Message ----- From: Marylen Ponto, LPN Sent: 0/07/8880  80:03 AM EST To: [name], MD Subject: pain med refill                                Mr Evett is requesting a refill on oxycodone 5/325 mg. He is scheduled to see you tomorrow. Last refilled by heart failure clinic on 05/21/20./ Pain clinic referral pending drug screen. Please advise Linden Dolin

## 2020-05-29 NOTE — Telephone Encounter (Signed)
Thank you so much, can you please let him know the results and that he will need controlled substance agreement signed next visit before we can prescribed narcotics? Thank you!

## 2020-05-29 NOTE — Telephone Encounter (Signed)
I notified William Crawford to come in and sign CSA/Opioid Consent.  There forms are at the front desk and once signed we will tell him medication will be sent in.

## 2020-06-05 ENCOUNTER — Encounter (HOSPITAL_COMMUNITY): Payer: Self-pay

## 2020-06-05 ENCOUNTER — Ambulatory Visit (HOSPITAL_COMMUNITY)
Admission: RE | Admit: 2020-06-05 | Discharge: 2020-06-05 | Disposition: A | Discharge status: Home / Self Care | Payer: BLUE CROSS/BLUE SHIELD | Source: Ambulatory Visit | Attending: Cardiovascular Disease | Admitting: Cardiovascular Disease

## 2020-06-05 ENCOUNTER — Other Ambulatory Visit: Payer: Self-pay

## 2020-06-05 DIAGNOSIS — I5022 Chronic systolic (congestive) heart failure: Secondary | ICD-10-CM | POA: Diagnosis not present

## 2020-06-11 ENCOUNTER — Telehealth: Payer: Self-pay | Admitting: *Deleted

## 2020-06-11 NOTE — Telephone Encounter (Signed)
Hi Sybil, pleae let him know that I hae started him on the dose he received when he came to me (40m twice per day), [facility] At his follow-up appointment on 2/4 we can discuss increasing the dose if he needs, but until then he needs to take as prescribed. Thank you!

## 2020-06-11 NOTE — Telephone Encounter (Signed)
William Crawford called because he is "getting low" on his medication (oxycodone 5/325 #60 prescribed 05/29/20).  He states that he was previously taking 10 mg and has been taking 2 of the tablets at a time. I have spoken with him and advised him that his CSA prohibits patients from escalating their medication on their own and must take as MD prescribes. He will have to wait until Dr Ranell Patrick comes back into the office on 1/20 to be addressed.     In looking at [place] PMP it looks like he has had multiple prescribers since October when Dr Lawson Fiscal was continuously prescribing after surgery.    05/29/2020 05/29/2020  Oxycodone-Acetaminophen 5-325 #60.00  30d [name], MD 05/21/2020 05/21/2020 Oxycodone-Acetaminophen 5-325 #20.00  10d [name] [facility] 05/20/2020 05/20/2020  Tramadol Hcl 50 Mg Tablet #28.00 7d [name] PA ([facility])  05/14/2020 05/14/2020  Oxycodone Hcl (Ir) 5 Mg Cap #28.00 3d [name], MD 05/08/2020 05/08/2020  Oxycodone Hcl (Ir) 10 Mg Tab #14.00 7d [name], MD 04/17/2020 04/17/2020  Hydrocodone-Acetamin 7.5-325 #30.00  5d [name], MD 03/20/2020 03/20/2020  Oxycodone Hcl (Ir) 10 Mg Tab #14.00 7d [name], Pa-C

## 2020-06-12 ENCOUNTER — Other Ambulatory Visit (HOSPITAL_COMMUNITY): Payer: Self-pay

## 2020-06-12 MED ORDER — ATORVASTATIN CALCIUM 40 MG PO TABS: 40.0000 mg | ORAL_TABLET | Freq: Every day | ORAL | 0 refills | Status: DC

## 2020-06-12 NOTE — Telephone Encounter (Signed)
Notified. 

## 2020-06-19 ENCOUNTER — Telehealth: Payer: Self-pay

## 2020-06-19 ENCOUNTER — Other Ambulatory Visit: Payer: Self-pay | Admitting: Physical Medicine and Rehabilitation

## 2020-06-19 ENCOUNTER — Other Ambulatory Visit (HOSPITAL_COMMUNITY): Payer: Self-pay | Admitting: Cardiology

## 2020-06-19 MED ORDER — TORSEMIDE 20 MG PO TABS: 20.0000 mg | ORAL_TABLET | ORAL | 3 refills | Status: DC

## 2020-06-19 MED ORDER — OXYCODONE-ACETAMINOPHEN 5-325 MG PO TABS: 1.0000 | ORAL_TABLET | Freq: Three times a day (TID) | ORAL | 0 refills | Status: DC | PRN

## 2020-06-19 NOTE — Telephone Encounter (Signed)
I have sent early refill for him since he was taking higher doses before- please let him know to take medication no more frequently than prescribed as we can only provide refill every 30 days. Thank you!

## 2020-06-19 NOTE — Telephone Encounter (Signed)
William Crawford is now out of pain medicine. He wanted to know if you will send a refill? His next appointment in on 2/4/202.   Call back ph- [phone]  (Dr. Ranell Patrick is not in the office today. Patient is has been informed.)

## 2020-06-20 NOTE — Telephone Encounter (Signed)
Patient has been informed.

## 2020-06-28 ENCOUNTER — Encounter: Payer: Self-pay | Admitting: Physical Medicine and Rehabilitation

## 2020-06-28 ENCOUNTER — Other Ambulatory Visit: Payer: Self-pay

## 2020-06-28 ENCOUNTER — Encounter
Payer: BLUE CROSS/BLUE SHIELD | Attending: Physical Medicine and Rehabilitation | Admitting: Physical Medicine and Rehabilitation

## 2020-06-28 VITALS — BP 128/80 | Ht 68.0 in | Wt 190.0 lb

## 2020-06-28 DIAGNOSIS — M25512 Pain in left shoulder: Secondary | ICD-10-CM | POA: Insufficient documentation

## 2020-06-28 DIAGNOSIS — M170 Bilateral primary osteoarthritis of knee: Secondary | ICD-10-CM | POA: Insufficient documentation

## 2020-06-28 DIAGNOSIS — Z5181 Encounter for therapeutic drug level monitoring: Secondary | ICD-10-CM | POA: Diagnosis not present

## 2020-06-28 DIAGNOSIS — G894 Chronic pain syndrome: Secondary | ICD-10-CM | POA: Diagnosis not present

## 2020-06-28 DIAGNOSIS — M792 Neuralgia and neuritis, unspecified: Secondary | ICD-10-CM | POA: Diagnosis not present

## 2020-06-28 DIAGNOSIS — M25511 Pain in right shoulder: Secondary | ICD-10-CM | POA: Insufficient documentation

## 2020-06-28 DIAGNOSIS — R0789 Other chest pain: Secondary | ICD-10-CM | POA: Diagnosis not present

## 2020-06-28 MED ORDER — OXYCODONE-ACETAMINOPHEN 10-325 MG PO TABS: 1.0000 | ORAL_TABLET | Freq: Two times a day (BID) | ORAL | 0 refills | Status: DC | PRN

## 2020-06-28 NOTE — Progress Notes (Signed)
Subjective:    Patient ID: William Crawford, male    DOB: 1971-05-22, 50 y.o.   MRN: [id number]  HPI  Due to national recommendations of social distancing because of COVID 30, an audio/video tele-health visit is felt to be the most appropriate encounter for this patient at this time. See MyChart message from today for the patient's consent to a tele-health encounter with [name]. This is a follow up tele-visit via MyChart Video. The patient is at home. MD is at office.   William Crawford is a 50 year old man who presents for follow-up of chest pain since he had open heart surgery.  1) Chest wall pain: He has pain every day. The pain is unbearable. He takes oxycodone and gabapentin. The Gabapentin does not help much. He takes 646m three times per day and 124moxycodone twice per day as needed. The most he has every taken was three. His recent surgery has recently helped! But pain still takes his breath away.   -Average pain is 10/10, pain is 8/10, pain is sharp, burning, and stabbing.  -He has not tried blue emu oil.   2) His shoulders and knees have also been hurting a lot recently. He has had steroid injections in his shoulder but they only provided an hour of relief.   Pain Inventory Average Pain 10 Pain Right Now 5 My pain is sharp, burning and stabbing  In the last 24 hours, has pain interfered with the following? General activity 10 Relation with others 0 Enjoyment of life 10 What TIME of day is your pain at its worst? morning , daytime, evening, night and varies Sleep (in general) Fair  Pain is worse with: walking, bending, sitting, inactivity, standing and some activites Pain improves with: medication Relief from Meds: 9  walk without assistance how many minutes can you walk? 30 ability to climb steps?  yes do you drive?  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 Heart attack Father    Social History   Socioeconomic History  . Marital status: Single    Spouse name: Not on file  . Number of children: Not on file  . Years of education: Not on file  . Highest education level: Not on file  Occupational History  . Not on file  Tobacco Use  . Smoking status: Former Smoker    Quit date: 10/23/2019    Years since quitting: 0.6  . Smokeless tobacco: Never Used  Vaping Use  . Vaping Use: Never used  Substance and Sexual Activity  . Alcohol use: Yes    Comment: QUIT  IN MAY [id number]. Drug use: No  . Sexual activity: Not on file  Other Topics Concern  . Not on file  Social History Narrative  . Not on file   Social Determinants of Health   Financial Resource Strain: Not on file  Food Insecurity: Not on file  Transportation Needs: Not on file  Physical Activity: Not on file  Stress: Not on file  Social Connections: Not on file   Past Surgical History:  Procedure Laterality Date  . APPLICATION OF WOUND VAC  10/25/2019   Procedure: Application Of Wound Vac;  Surgeon: VaIvin PootMD;  [facility] Service: Open [facility] Surgery;;  . CANNULATION FOR ECMO (EXTRACORPOREAL MEMBRANE OXYGENATION)  10/25/2019   Procedure: Cannulation For Ecmo (Extracorporeal Membrane Oxygenation) @ 8242;  Surgeon: Prescott Gum, Collier Salina, MD;  Location: [facility];  Service: Open Heart Surgery;;  . CANNULATION FOR ECMO (EXTRACORPOREAL MEMBRANE OXYGENATION) N/A 10/30/2019   Procedure: DECANNULATION OF ECMO (EXTRACORPOREAL MEMBRANE OXYGENATION);  Surgeon: Prescott Gum, Collier Salina, MD;  Location: [facility];  Service: Open Heart Surgery;  Laterality: N/A;  Pump standby  . CHEST EXPLORATION  10/25/2019   Procedure: Chest Exploration for repair of coronary perforation (repair of LAD);  Surgeon: Prescott Gum, Collier Salina, MD;  Location: [facility];  Service: Open Heart Surgery;;  . CORONARY CTO INTERVENTION N/A 10/25/2019   Procedure: CORONARY CTO INTERVENTION;  Surgeon: Martinique,  Peter M, MD;  Location: [facility];  Service: Cardiovascular;  Laterality: N/A;  . CORONARY STENT INTERVENTION N/A 10/17/2019   Procedure: CORONARY STENT INTERVENTION;  Surgeon: Sherren Mocha, MD;  Location: [facility];  Service: Cardiovascular;  Laterality: N/A;  . LEFT HEART CATH AND CORONARY ANGIOGRAPHY N/A 10/17/2019   Procedure: LEFT HEART CATH AND CORONARY ANGIOGRAPHY;  Surgeon: Sherren Mocha, MD;  Location: [facility];  Service: Cardiovascular;  Laterality: N/A;  . MEDIASTERNOTOMY  10/25/2019   Procedure: Median Sternotomy.;  Surgeon: [name], MD;  Location: [facility];  Service: Open Heart Surgery;;  . PLACEMENT OF IMPELLA LEFT VENTRICULAR ASSIST DEVICE  10/30/2019   Procedure: Placement Of Impella Left Ventricular Assist Device 5.5 USING 10MM HEMASHIELD PLATINUM GRAFT;  Surgeon: [name], MD;  Location: [facility];  Service: Open Heart Surgery;;  graft placed into aorta  . REMOVAL OF IMPELLA LEFT VENTRICULAR ASSIST DEVICE N/A 11/03/2019   Procedure: REMOVAL OF IMPELLA 5.5 LEFT VENTRICULAR ASSIST DEVICE;  Surgeon: [name], MD;  Location: [facility] [facility];  Service: Open Heart Surgery;  Laterality: N/A;  . STERNAL CLOSURE N/A 10/30/2019   Procedure: Sternal Closure;  Surgeon: [name], MD;  Location: [facility];  Service: Open Heart Surgery;  Laterality: N/A;  . STERNAL WIRES REMOVAL N/A 05/14/2020   Procedure: STERNAL WIRES REMOVAL;  Surgeon: [name], MD;  Location: [facility];  Service: Thoracic;  Laterality: N/A;  . TEE WITHOUT CARDIOVERSION N/A 10/25/2019   Procedure: TRANSESOPHAGEAL ECHOCARDIOGRAM (TEE);  Surgeon: Prescott Gum, Collier Salina, MD;  Location: [facility];  Service: Open Heart Surgery;  Laterality: N/A;  . TEE WITHOUT CARDIOVERSION N/A 10/30/2019   Procedure: TRANSESOPHAGEAL ECHOCARDIOGRAM (TEE);  Surgeon: Prescott Gum, Collier Salina, MD;  Location: [facility];  Service: Open Heart Surgery;  Laterality: N/A;  . TEE WITHOUT CARDIOVERSION N/A 11/03/2019   Procedure:  TRANSESOPHAGEAL ECHOCARDIOGRAM (TEE);  Surgeon: Prescott Gum, Collier Salina, MD;  Location: [facility];  Service: Open Heart Surgery;  Laterality: N/A;   Past Medical History:  Diagnosis Date  . Alcohol abuse   . Anxiety   . CAD (coronary artery disease)   . Cardiac arrest ([place])   . Cardiogenic shock ([place])   . CHF (congestive heart failure) ([place])   . Cough   . Dysrhythmia   . GERD (gastroesophageal reflux disease)   . HFrEF (heart failure with reduced ejection fraction) ([place])   . Hypertension   . Hypertriglyceridemia   . Ischemic cardiomyopathy   . Right atrial thrombus   . Tobacco abuse    There were no vitals taken for this visit.  Opioid Risk Score:   Fall Risk Score:  `1  Depression screen PHQ 2/9  Depression screen PHQ 2/9 05/10/2020  Decreased Interest 0  Down, Depressed, Hopeless 0  PHQ -  2 Score 0  Altered sleeping 1  Tired, decreased energy 1  Change in appetite 0  Feeling bad or failure about yourself  0  Trouble concentrating 0  Moving slowly or fidgety/restless 0  Suicidal thoughts 0  PHQ-9 Score 2  Difficult doing work/chores Somewhat difficult    Review of Systems  Cardiovascular:       Chest pain at surgical site.   Musculoskeletal:       Left Shoulder pain  Pain in both knees  All other systems reviewed and are negative.      Objective:   Physical Exam Gen: no distress, normal appearing HEENT: oral mucosa pink and moist, NCAT Cardio: Reg rate Chest: normal effort, normal rate of breathing Abd: soft, non-distended Ext: no edema Skin: intact Neuro: Alert and oriented x3.  Musculoskeletal: Tenderness to palpation over chest.  Psych: pleasant, normal affect    Assessment & Plan:  Mr. Wager is a 50 year old man who presents to establish care for chronic chest pain since he had open heart surgery.   1) Chronic Pain Syndrome secondary to chronic chest pain following open heat surgery.  -Has never had any function -Dr. Prescott Gum plans to do repeat  surgery. -Has tried Gabapentin before.  -Discussed the side effects of opioid medications.  -UDS obtained previously and shows expected metabolites -Percocet 76m BID ordered (prior dose), may release early given we are increasing to prior dose.   -Discussed blue emu oil and lidocaine patches.  -Discussed current symptoms of pain and history of pain.  -Discussed benefits of exercise in reducing pain. -Discussed following foods that may reduce pain: 1) Ginger 2) Blueberries 3) Salmon 4) Pumpkin seeds 5) dark chocolate 6) turmeric 7) tart cherries 8) virgin olive oil 9) chilli peppers 10) mint  Link to further information on diet for chronic pain: [email or web address]  2) Left shoulder rotator cuff impingement -Can consider lidocaine patch, blue emu oil.   3) Bilateral shoulder OA -Ordered bilateral shoulder XRs  4) Bilateral knee OA: -Ordered bilateral knee XRs  Discussed viscosupplementation given that his knee pain that has failed months of conservative care. Patient has joint pain, swelling, stiffness, instability, locking, and difficulty with ambulation.

## 2020-07-04 DIAGNOSIS — H5213 Myopia, bilateral: Secondary | ICD-10-CM | POA: Diagnosis not present

## 2020-07-06 ENCOUNTER — Other Ambulatory Visit (HOSPITAL_COMMUNITY): Payer: Self-pay | Admitting: Cardiology

## 2020-07-10 ENCOUNTER — Other Ambulatory Visit: Payer: Self-pay | Admitting: Physical Medicine and Rehabilitation

## 2020-07-10 ENCOUNTER — Telehealth: Payer: Self-pay | Admitting: *Deleted

## 2020-07-10 MED ORDER — OXYCODONE-ACETAMINOPHEN 10-325 MG PO TABS: 1.0000 | ORAL_TABLET | Freq: Two times a day (BID) | ORAL | 0 refills | Status: DC | PRN

## 2020-07-10 NOTE — Telephone Encounter (Signed)
William Crawford called because he is almost out of his medication and is requesting a refill. Oxy 10/325 last filled 06/28/20 #30.  He reports that it helps some but not on his bad days.  He has had more CP the last few days and his knees have really been hurting him. He does not have an appt scheduled, I have let appt desk know to call him and schedule per your note for 6 weeks- for around 08/06/20.

## 2020-07-10 NOTE — Telephone Encounter (Signed)
Notified. 

## 2020-07-10 NOTE — Telephone Encounter (Signed)
Sent!

## 2020-07-19 ENCOUNTER — Telehealth (HOSPITAL_COMMUNITY): Payer: Self-pay | Admitting: *Deleted

## 2020-07-19 NOTE — Telephone Encounter (Signed)
Pt left vm stating his pcp wants to start him on meloxicam for pain but wanted to make sure it was ok to take with heart condition and cardiac meds.   Routed to [facility] for advice

## 2020-07-20 NOTE — Telephone Encounter (Signed)
Would use only for a short period of time, 1-2 weeks.

## 2020-07-22 ENCOUNTER — Other Ambulatory Visit: Payer: Self-pay | Admitting: Physical Medicine and Rehabilitation

## 2020-07-22 MED ORDER — MELOXICAM 15 MG PO TABS: 15.0000 mg | ORAL_TABLET | Freq: Every day | ORAL | 0 refills | Status: DC | PRN

## 2020-07-22 NOTE — Telephone Encounter (Signed)
Pt aware and verbalized understanding.  

## 2020-07-26 ENCOUNTER — Other Ambulatory Visit: Payer: Self-pay | Admitting: Physical Medicine and Rehabilitation

## 2020-07-26 MED ORDER — OXYCODONE-ACETAMINOPHEN 10-325 MG PO TABS: 1.0000 | ORAL_TABLET | Freq: Three times a day (TID) | ORAL | 0 refills | Status: DC | PRN

## 2020-08-12 ENCOUNTER — Ambulatory Visit (HOSPITAL_COMMUNITY)
Admission: RE | Admit: 2020-08-12 | Discharge: 2020-08-12 | Disposition: A | Discharge status: Home / Self Care | Payer: BLUE CROSS/BLUE SHIELD | Source: Ambulatory Visit | Attending: Physical Medicine and Rehabilitation | Admitting: Physical Medicine and Rehabilitation

## 2020-08-12 ENCOUNTER — Other Ambulatory Visit: Payer: Self-pay

## 2020-08-12 ENCOUNTER — Other Ambulatory Visit: Payer: Self-pay | Admitting: Physical Medicine and Rehabilitation

## 2020-08-12 DIAGNOSIS — M25562 Pain in left knee: Secondary | ICD-10-CM | POA: Diagnosis not present

## 2020-08-12 DIAGNOSIS — M170 Bilateral primary osteoarthritis of knee: Secondary | ICD-10-CM | POA: Insufficient documentation

## 2020-08-12 DIAGNOSIS — M25552 Pain in left hip: Secondary | ICD-10-CM | POA: Insufficient documentation

## 2020-08-12 DIAGNOSIS — M25561 Pain in right knee: Secondary | ICD-10-CM | POA: Diagnosis not present

## 2020-08-20 NOTE — Progress Notes (Signed)
Cardiology Office Note   Date:  08/22/2020   ID:  Nezar, Buckles 28-Apr-1971, MRN [id number]  PCP:  [name], MD  Cardiologist: Dr. Burt Knack, MD   Chief Complaint  Patient presents with  . Follow-up    History of Present Illness: William Crawford is a 50 y.o. male who presents for 71-monthfollow-up, seen for Dr. CBurt Knack  Mr. JPickrellhas a history of CAD with NSTEMI on 10/17/2019 with PCI/DES to the RCA with residual CTO of the proximal LAD.  CTO intervention attempted 10/25/2019 however this was complicated by LAD perforation, pericardial effusion and cardiac tamponade requiring emergent OR repair complicated by VF arrest in the OR with approximately 15 minutes of CPR with cardiogenic shock requiring ECMO and Impella along with vasopressor inotropic support. Also with right atrial thrombus at Impella treated with Coumadin, ischemic cardiomyopathy with subsequent normalization of LV function (initially less than 20% on 10/26/2019 with improvement to 40 to 45%>> 55%), heavy EtOH, prior tobacco use, strong family history of CAD and hypertension triglyceridemia.   He was then seen during a hospitalization 02/2020 for the evaluation of presyncopal symptoms including dizziness, nausea and diaphoresis. There was no LOC. He was at work at the time of the event therefore a coworker prompted him to go to the ED for further work-up. EKG showed bradycardia at 58 bpm with RBBB and no acute ST-T wave changes. Cardiac work-up was essentially negative with no significant arrhythmias on telemetry, orthostatic vitals were found to be normal. Symptoms felt to be secondary to a vasovagal episode.  HRs remained in the 40-50s therefore his carvedilol was stopped. Plan was for 2-week cardiac monitor to evaluate for any significant arrhythmias. He was continued on Entresto, Aldactone, ASA, Plavix and Lipitor. Monitor dated 03/20/2020 showed no significant arrhythmias or pauses.   He was then seen  05/21/2020 by Dr. MAundra Dubinwith no further lightheadedness or syncope.  Repeat echocardiogram performed that showed recovery of LVEF and no thrombus therefore Coumadin was discontinued and patient started on ASA 81 daily.  BP was noted to be elevated on last office visit therefore carvedilol 3.125 mg twice daily was restarted.  No ICD was indicated given LV recovery. In most recent follow up with Dr. MAundra Dubinyesterday, his spiro was discontinued due to painful gynecomastia and his torsemide was also stopped.   Today he is doing great from a CV standpoint, he has no further complaints. We discussed alternating with Dr. MAundra Dubinso he is not here back to back from here on out. We went over his lab work that is markedly normal. Lipid panel looks great. He denies chest pain, palpitations, LE edema, orthopnea, dizziness, SOB or syncope.   Past Medical History:  Diagnosis Date  . Alcohol abuse   . Anxiety   . CAD (coronary artery disease)   . Cardiac arrest ([facility]   . Cardiogenic shock ([facility]   . CHF (congestive heart failure) ([facility]   . Cough   . Dysrhythmia   . GERD (gastroesophageal reflux disease)   . HFrEF (heart failure with reduced ejection fraction) ([facility]   . Hypertension   . Hypertriglyceridemia   . Ischemic cardiomyopathy   . Right atrial thrombus   . Tobacco abuse     Past Surgical History:  Procedure Laterality Date  . APPLICATION OF WOUND VAC  10/25/2019   Procedure: Application Of Wound Vac;  Surgeon: VIvin Poot MD;  [place] [facility]  Service: Open Heart Surgery;;  .  CANNULATION FOR ECMO (EXTRACORPOREAL MEMBRANE OXYGENATION)  10/25/2019   Procedure: Cannulation For Ecmo (Extracorporeal Membrane Oxygenation) @ 6433;  Surgeon: Prescott Gum, Collier Salina, MD;  Location: [place];  Service: Open Heart Surgery;;  . CANNULATION FOR ECMO (EXTRACORPOREAL MEMBRANE OXYGENATION) N/A 10/30/2019   Procedure: DECANNULATION OF ECMO (EXTRACORPOREAL MEMBRANE OXYGENATION);  Surgeon: Prescott Gum, Collier Salina, MD;  Location:  [place];  Service: Open Heart Surgery;  Laterality: N/A;  Pump standby  . CHEST EXPLORATION  10/25/2019   Procedure: Chest Exploration for repair of coronary perforation (repair of LAD);  Surgeon: Prescott Gum, Collier Salina, MD;  Location: [facility];  Service: Open Heart Surgery;;  . CORONARY CTO INTERVENTION N/A 10/25/2019   Procedure: CORONARY CTO INTERVENTION;  Surgeon: Martinique, Peter M, MD;  Location: [facility] [facility];  Service: Cardiovascular;  Laterality: N/A;  . CORONARY STENT INTERVENTION N/A 10/17/2019   Procedure: CORONARY STENT INTERVENTION;  Surgeon: Sherren Mocha, MD;  Location: [facility];  Service: Cardiovascular;  Laterality: N/A;  . LEFT HEART CATH AND CORONARY ANGIOGRAPHY N/A 10/17/2019   Procedure: LEFT HEART CATH AND CORONARY ANGIOGRAPHY;  Surgeon: Sherren Mocha, MD;  Location: [place];  Service: Cardiovascular;  Laterality: N/A;  . MEDIASTERNOTOMY  10/25/2019   Procedure: Median Sternotomy.;  Surgeon: [name], MD;  Location: [place];  Service: Open Heart Surgery;;  . PLACEMENT OF IMPELLA LEFT VENTRICULAR ASSIST DEVICE  10/30/2019   Procedure: Placement Of Impella Left Ventricular Assist Device 5.5 USING 10MM HEMASHIELD PLATINUM GRAFT;  Surgeon: [name], MD;  Location: [facility];  Service: Open Heart Surgery;;  graft placed into aorta  . REMOVAL OF IMPELLA LEFT VENTRICULAR ASSIST DEVICE N/A 11/03/2019   Procedure: REMOVAL OF IMPELLA 5.5 LEFT VENTRICULAR ASSIST DEVICE;  Surgeon: [name], MD;  Location: [facility];  Service: Open Heart Surgery;  Laterality: N/A;  . STERNAL CLOSURE N/A 10/30/2019   Procedure: Sternal Closure;  Surgeon: [name], MD;  Location: [facility];  Service: Open Heart Surgery;  Laterality: N/A;  . STERNAL WIRES REMOVAL N/A 05/14/2020   Procedure: STERNAL WIRES REMOVAL;  Surgeon: [name], MD;  Location: [facility];  Service: Thoracic;  Laterality: N/A;  . TEE WITHOUT CARDIOVERSION N/A 10/25/2019   Procedure: TRANSESOPHAGEAL ECHOCARDIOGRAM  (TEE);  Surgeon: Prescott Gum, Collier Salina, MD;  Location: [facility];  Service: Open Heart Surgery;  Laterality: N/A;  . TEE WITHOUT CARDIOVERSION N/A 10/30/2019   Procedure: TRANSESOPHAGEAL ECHOCARDIOGRAM (TEE);  Surgeon: Prescott Gum, Collier Salina, MD;  Location: [place];  Service: Open Heart Surgery;  Laterality: N/A;  . TEE WITHOUT CARDIOVERSION N/A 11/03/2019   Procedure: TRANSESOPHAGEAL ECHOCARDIOGRAM (TEE);  Surgeon: Prescott Gum, Collier Salina, MD;  Location: [place];  Service: Open Heart Surgery;  Laterality: N/A;     Current Outpatient Medications  Medication Sig Dispense Refill  . Ascorbic Acid (VITAMIN C) 100 MG tablet Take 100 mg by mouth daily.    Marland Kitchen aspirin EC 81 MG tablet Take 1 tablet (81 mg total) by mouth daily. Swallow whole. 90 tablet 3  . atorvastatin (LIPITOR) 40 MG tablet Take 1 tablet (40 mg total) by mouth daily. 60 tablet 0  . carvedilol (COREG) 3.125 MG tablet Take 1 tablet (3.125 mg total) by mouth 2 (two) times daily with a meal. 60 tablet 3  . cetirizine (ZYRTEC) 10 MG tablet Take 10 mg by mouth daily.    . clopidogrel (PLAVIX) 75 MG tablet Take 1 tablet (75 mg total) by mouth daily. 90 tablet 3  .  fluticasone (FLONASE) 50 MCG/ACT nasal spray Place 2 sprays into both nostrils daily.    Marland Kitchen gabapentin (NEURONTIN) 100 MG capsule Take 300 mg by mouth 3 (three) times daily.    Marland Kitchen icosapent Ethyl (VASCEPA) 1 g capsule Take 2 capsules (2 g total) by mouth 2 (two) times daily. 120 capsule 5  . Misc Natural Products (OSTEO BI-FLEX ADV JOINT SHIELD) TABS Take 2 tablets by mouth.    . oxyCODONE-acetaminophen (PERCOCET) 10-325 MG tablet Take 1 tablet by mouth 3 (three) times daily as needed for pain. 90 tablet 0  . POTASSIUM CHLORIDE ER PO Take by mouth daily.    . sacubitril-valsartan (ENTRESTO) 49-51 MG Take 1 tablet by mouth 2 (two) times daily. 60 tablet 11  . thiamine 100 MG tablet Take 1 tablet (100 mg total) by mouth daily. 30 tablet 1  . Turmeric 500 MG CAPS Take 500 mg by mouth daily.     No current  facility-administered medications for this visit.    Allergies:   Patient has no known allergies.    Social History:  The patient  reports that he quit smoking about 10 months ago. He has never used smokeless tobacco. He reports current alcohol use. He reports that he does not use drugs.   Family History:  The patient's family history includes Heart attack in his father.    ROS:  Please see the history of present illness.   Otherwise, review of systems are positive for none. All other systems are reviewed and negative.    PHYSICAL EXAM: VS:  BP 120/72   Pulse 62   Ht 5' 8"  (1.727 m)   Wt 193 lb (87.5 kg)   SpO2 97%   BMI 29.35 kg/m  , BMI Body mass index is 29.35 kg/m.   General: Well developed, well nourished, NAD Neck: Negative for carotid bruits. No JVD Lungs:Clear to ausculation bilaterally. No wheezes, rales, or rhonchi. Breathing is unlabored. Cardiovascular: RRR with S1 S2. No murmurs Extremities: No edema. Neuro: Alert and oriented. No focal deficits. No facial asymmetry. MAE spontaneously. Psych: Responds to questions appropriately with normal affect.    EKG:  EKG is not ordered today.  Recent Labs: 10/17/2019: B Natriuretic Peptide 608.8 03/18/2020: Magnesium 2.3; TSH 4.584 05/13/2020: ALT 28; Hemoglobin 12.6; Platelets 266 08/21/2020: BUN 10; Creatinine, Ser 1.08; Potassium 4.1; Sodium 141    Lipid Panel    Component Value Date/Time   CHOL 109 08/21/2020 1031   TRIG 80 08/21/2020 1031   HDL 36 (L) 08/21/2020 1031   CHOLHDL 3.0 08/21/2020 1031   VLDL 16 08/21/2020 1031   LDLCALC 57 08/21/2020 1031      Wt Readings from Last 3 Encounters:  08/22/20 193 lb (87.5 kg)  08/21/20 191 lb 12.8 oz (87 kg)  06/28/20 190 lb (86.2 kg)    Other studies Reviewed: Additional studies/ records that were reviewed today include:  Review of the above records demonstrates:   Limited echo 03/19/2020: 1. Left ventricular ejection fraction, by estimation, is 50 to 55%.  The  left ventricle has low normal function. The left ventricle demonstrates  regional wall motion abnormalities (see scoring diagram/findings for  description). There is mild hypokinesis  of the left ventricular, mid-apical anteroseptal wall.  2. The right ventricular size is normal.  3. The mitral valve is normal in structure. Trivial mitral valve  regurgitation.  4. The inferior vena cava is normal in size with <50% respiratory  variability, suggesting right atrial pressure of 8 mmHg. __________  2D echo 02/20/2020: 1. Left ventricular ejection fraction, by estimation, is 55%. The left  ventricle has normal function. The left ventricle demonstrates regional  wall motion abnormalities with mild anteroseptal hypokinesis. Left  ventricular diastolic parameters are  consistent with Grade II diastolic dysfunction (pseudonormalization).  2. Right ventricular systolic function is normal. The right ventricular  size is normal. Tricuspid regurgitation signal is inadequate for assessing  PA pressure.  3. The mitral valve is normal in structure. No evidence of mitral valve  regurgitation. No evidence of mitral stenosis.  4. The aortic valve is tricuspid. Aortic valve regurgitation is not  visualized. No aortic stenosis is present.  5. The inferior vena cava is normal in size with greater than 50%  respiratory variability, suggesting right atrial pressure of 3 mmHg. __________  Limited Echo 11/02/19 EF 40-45, normal RVSF  Limited Echo 11/01/19 EF 30-35  Limited Echo 10/31/19 EF 25-30  Limited Echo 10/29/19 EF 20-25  Echo 10/26/19 EF < 20, Gr 2 DD, AS, lat, ant, inf, apical severe AK, severely reduced RVSF, mild RAE, mild MR  Limited Echo 10/25/19 EF 45, mild LVH, normal RVSF, mod pericardial effusion w/ tamponade physio  CTO PCI 10/25/19  Mid LAD lesion is 100% stenosed.  1st Mrg lesion is 100% stenosed.  Mid Cx to Dist Cx lesion is 50% stenosed.  Prox RCA to Mid RCA  lesion is 35% stenosed.  A covered stent placed using a STENT PK PAPYRUS 2.5X20. and a 2.5 x 15 mm PK PAPYRUS with unsuccessful sealing of the perforation. 1. Unsuccessful CTO PCI of the LAD. Able to cross the lesion with a wire. With initial balloon inflation with a 2.0 mm balloon the patient developed a large perforation. We were unable to seal the perforation with prolonged balloon inflations and placement of Covered stents x 2. Balloon left inflated in the vessel to tamponade the perforation until definitive surgery done. Plan emergent surgery for oversewing of the perforation.  Cardiac catheterization 10/17/19 LAD mid 100 (CTO) L-L collats; D1 mild dz LCx mid 50; OM1 100  RCA prox 35, mid 95 PCI: 4 x 18 mm Resolute Onyx DES to mRCA  Echocardiogram 10/17/19 EF 35-40, inf-lat + inf HK, Gr 2 DD, normal RVSF  ASSESSMENT AND PLAN:  1.Presyncope: -No recurrence>>felt to be vasovagal given negative cardiac workup as above   2. Distal ascending thoracic aortic pseudoaneurysm: -Case discussed between Drs. End and Prescott Gum with the above felt to be related to stalk from prior Impella without need for further evaluation/intervention at this time -Last chest CTA 10/21 with focal 1.7cm outpouching of the distal ascending thoracic aorta  3. CAD s/p NSTEMI 09/2019: -No reports of chest pain today -Continue ASA, Plavix, beta blocker and statin  -Will plan to alternate appointments with Dr. Claris Gladden team>>f/u in 1 year with [place] and is planning for 6 months with Dr. Aundra Dubin  4.ICM: -Last echo 02/2020 with EF at 50-55%, mild hypokinesis of the left ventricular, mid-apical anteroseptal wall consistent with prior infarct  -Appears euvolemic on exam. Torsemide discontinued with Dr. Aundra Dubin yesterday along with spiro given painful gynecomastia -Continue Entresto  5. History of right atrial thrombus: -Related to venous cannulation for ECMO>>resolved>>>now off anticoagulation as of  02/20/2020  6. HTN: -Stable, 120/72 -Continue current regimen   6. HLD: -Lipid panel from yesterday with LDL at 57>>continue atorvastatin, Vascepa   7. Hx of ETOH and tobacco abuse: -Remains abstinent   Current medicines are reviewed at length with the patient today.  The  patient does not have concerns regarding medicines.  The following changes have been made:  no change  Labs/ tests ordered today include: None  No orders of the defined types were placed in this encounter.  Disposition:   FU with Dr. Burt Knack in 1 year  Signed, Kathyrn Drown, NP  08/22/2020 3:46 PM    [address], [place], [place]  [id number] Phone: [phone]; Fax: [phone]

## 2020-08-21 ENCOUNTER — Other Ambulatory Visit: Payer: Self-pay

## 2020-08-21 ENCOUNTER — Ambulatory Visit (HOSPITAL_COMMUNITY)
Admission: RE | Admit: 2020-08-21 | Discharge: 2020-08-21 | Disposition: A | Discharge status: Home / Self Care | Payer: BLUE CROSS/BLUE SHIELD | Source: Ambulatory Visit | Attending: Cardiology | Admitting: Cardiology

## 2020-08-21 ENCOUNTER — Encounter (HOSPITAL_COMMUNITY): Payer: Self-pay | Admitting: Cardiology

## 2020-08-21 VITALS — BP 118/62 | HR 75 | Wt 191.8 lb

## 2020-08-21 DIAGNOSIS — I110 Hypertensive heart disease with heart failure: Secondary | ICD-10-CM | POA: Insufficient documentation

## 2020-08-21 DIAGNOSIS — N62 Hypertrophy of breast: Secondary | ICD-10-CM | POA: Insufficient documentation

## 2020-08-21 DIAGNOSIS — E781 Pure hyperglyceridemia: Secondary | ICD-10-CM | POA: Insufficient documentation

## 2020-08-21 DIAGNOSIS — Z79899 Other long term (current) drug therapy: Secondary | ICD-10-CM | POA: Insufficient documentation

## 2020-08-21 DIAGNOSIS — Z955 Presence of coronary angioplasty implant and graft: Secondary | ICD-10-CM | POA: Diagnosis not present

## 2020-08-21 DIAGNOSIS — Z7982 Long term (current) use of aspirin: Secondary | ICD-10-CM | POA: Insufficient documentation

## 2020-08-21 DIAGNOSIS — I2510 Atherosclerotic heart disease of native coronary artery without angina pectoris: Secondary | ICD-10-CM | POA: Diagnosis not present

## 2020-08-21 DIAGNOSIS — E782 Mixed hyperlipidemia: Secondary | ICD-10-CM

## 2020-08-21 DIAGNOSIS — I469 Cardiac arrest, cause unspecified: Secondary | ICD-10-CM | POA: Diagnosis not present

## 2020-08-21 DIAGNOSIS — Z8249 Family history of ischemic heart disease and other diseases of the circulatory system: Secondary | ICD-10-CM | POA: Diagnosis not present

## 2020-08-21 DIAGNOSIS — Z87891 Personal history of nicotine dependence: Secondary | ICD-10-CM | POA: Diagnosis not present

## 2020-08-21 DIAGNOSIS — Z7901 Long term (current) use of anticoagulants: Secondary | ICD-10-CM | POA: Diagnosis not present

## 2020-08-21 DIAGNOSIS — Z7902 Long term (current) use of antithrombotics/antiplatelets: Secondary | ICD-10-CM | POA: Diagnosis not present

## 2020-08-21 DIAGNOSIS — I255 Ischemic cardiomyopathy: Secondary | ICD-10-CM | POA: Diagnosis not present

## 2020-08-21 DIAGNOSIS — I252 Old myocardial infarction: Secondary | ICD-10-CM | POA: Insufficient documentation

## 2020-08-21 DIAGNOSIS — I5022 Chronic systolic (congestive) heart failure: Secondary | ICD-10-CM

## 2020-08-21 LAB — BASIC METABOLIC PANEL
Anion gap: 9 (ref 5–15)
BUN: 10 mg/dL (ref 6–20)
CO2: 27 mmol/L (ref 22–32)
Calcium: 9.8 mg/dL (ref 8.9–10.3)
Chloride: 105 mmol/L (ref 98–111)
Creatinine, Ser: 1.08 mg/dL (ref 0.61–1.24)
GFR, Estimated: >60 mL/min (ref >60)
Glucose, Bld: 88 mg/dL (ref 70–99)
Potassium: 4.1 mmol/L (ref 3.5–5.1)
Sodium: 141 mmol/L (ref 135–145)

## 2020-08-21 LAB — LIPID PANEL
Cholesterol: 109 mg/dL (ref 0–200)
HDL: 36 mg/dL — ABNORMAL LOW (ref >40)
LDL Cholesterol: 57 mg/dL (ref 0–99)
Total CHOL/HDL Ratio: 3 RATIO
Triglycerides: 80 mg/dL (ref <150)
VLDL: 16 mg/dL (ref 0–40)

## 2020-08-21 NOTE — Progress Notes (Signed)
PCP: [name], MD  [facility] Cardiology: Dr. Aundra Dubin  50 y.o. with history of heavy smoking, heavy ETOH, and strong FH of CAD was initially admitted to [facility] on 10/17/19 with chest pain and NSTEMI.  LHC showed 95% distal RCA (culprit) with chronically occluded proximal LAD and OM1 with collaterals.  He had DES to distal RCA initially.  Echo showed EF 35-40% with inferior wall motion abnormality and normal RV.  He was discharged home with plan for CTO intervention on LAD.  He came for elective CTO procedure in 6/21, however he had perforation of the LAD with balloon inflation. A covered stent was placed.  Echo showed EF 45% with apical hypokinesis, moderate pericardial effusion and evidence for tamponade.  He was then taken to the OR for LAD repair and drainage of pericardial effusion.   In the OR, he had a VF arrest with about 15 minutes of CPR.  He was put on cardiopulmonary bypass and the tear in the LAD was repaired.  The LAD was not graftable.  TEE in the OR showed severe biventricular failure, suspect stunning post-VF arrest.  He was unable to come off bypass.  Therefore, he was cannulated for ECMO with venous drainage from RA and arterial return to ascending aorta.  After 4 days, ECMO was decannulated and Impella 5.5 was placed.  4 days after that, Impella was removed. He was eventually weaned off pressors/inotropes and discharged home.  Echo in 11/03/19 showed EF up to 45-50%.    Echo was done in 9/21 with EF 55% with mild anteroseptal hypokinesis and normal RV.   Patient was admitted with presyncope in 10/21.  Workup was unremarkable, thought to be possible vasovagal event.  As HR was around 50 at times, Coreg was stopped. Delene Loll was also decreased. Echo in 10/21 showed EF 50-55% with normal RV. Zio patch showed no significant arrhythmias.   Patient returns for followup of CAD and CHF.  He still has some neuropathic pain in his chest but it is controlled with gabapentin.  No exertional chest  pain.  He is working as a Curator.  No significant exertional dyspnea.  No orthopnea/PND.  Weight is stable.  He reports painful gynecomastia.  No smoking or ETOH.   Labs (7/21): K 4.1, creatinine 1.08 Labs (8/21): K 3.9, creatinine 1.04, LDL 67, HDL 37, TGs 248 Labs (12/21): K 3.7, creatinine 1.2  PMH: 1. ETOH abuse 2. Smoking 3. GERD 4. CAD: NSTEMI in 5/21 with 95% distal RCA (culprit) and CTO LAD and OM1.  He had DES to distal RCA.  - He returned in 6/21 for CTO procedure to LAD, complicated by perforation of the LAD.  He was taken to the OR emergently for repair of the perforation.  Unable to graft the LAD.  5. VF arrest: 6/21 at time of LAD perforation/tamponade. 6. Ischemic cardiomyopathy:  - Echo (5/21): EF 35-40%. - Echo (6/21, post arrest): EF < 20%, moderately reduced RV function.  - ECMO in 6/21 post-LAD perforation and arrest.  - Echo (11/03/19): EF 45-50%.  - Echo (9/21): EF 55%, mild anteroseptal hypokinesis, normal RV.  - Echo (10/21): EF 50-55% with normal RV. 7. Right atrial thrombus: Noted post-ECMO.  Warfarin.  8. Presyncopal episode in 10/21: Zio patch (10/21) with no significant arrhythmias noted.  9. Neuropathic chest wall pain post-sternotomy.   Social History   Socioeconomic History  . Marital status: Single    Spouse name: Not on file  . Number of children: Not on  file  . Years of education: Not on file  . Highest education level: Not on file  Occupational History  . Not on file  Tobacco Use  . Smoking status: Former Smoker    Quit date: 10/23/2019    Years since quitting: 0.8  . Smokeless tobacco: Never Used  Vaping Use  . Vaping Use: Never used  Substance and Sexual Activity  . Alcohol use: Yes    Comment: QUIT  IN MAY 63149  . Drug use: No  . Sexual activity: Not on file  Other Topics Concern  . Not on file  Social History Narrative  . Not on file   Social Determinants of Health   Financial Resource Strain: Not on file  Food Insecurity:  Not on file  Transportation Needs: Not on file  Physical Activity: Not on file  Stress: Not on file  Social Connections: Not on file  Intimate Partner Violence: Not on file   Family History  Problem Relation Age of Onset  . Heart attack Father    ROS: All systems reviewed and negative except as per HPI.   Current Outpatient Medications  Medication Sig Dispense Refill  . Ascorbic Acid (VITAMIN C) 100 MG tablet Take 100 mg by mouth daily.    Marland Kitchen aspirin EC 81 MG tablet Take 1 tablet (81 mg total) by mouth daily. Swallow whole. 90 tablet 3  . atorvastatin (LIPITOR) 40 MG tablet Take 1 tablet (40 mg total) by mouth daily. 60 tablet 0  . carvedilol (COREG) 3.125 MG tablet Take 1 tablet (3.125 mg total) by mouth 2 (two) times daily with a meal. 60 tablet 3  . cetirizine (ZYRTEC) 10 MG tablet Take 10 mg by mouth daily.    . clopidogrel (PLAVIX) 75 MG tablet Take 1 tablet (75 mg total) by mouth daily. 90 tablet 3  . fluticasone (FLONASE) 50 MCG/ACT nasal spray Place 2 sprays into both nostrils daily.    Marland Kitchen gabapentin (NEURONTIN) 100 MG capsule Take 300 mg by mouth 3 (three) times daily.    Marland Kitchen icosapent Ethyl (VASCEPA) 1 g capsule Take 2 capsules (2 g total) by mouth 2 (two) times daily. 120 capsule 5  . Misc Natural Products (OSTEO BI-FLEX ADV JOINT SHIELD) TABS Take 2 tablets by mouth.    . oxyCODONE-acetaminophen (PERCOCET) 10-325 MG tablet Take 1 tablet by mouth 3 (three) times daily as needed for pain. 90 tablet 0  . POTASSIUM CHLORIDE ER PO Take by mouth daily.    . sacubitril-valsartan (ENTRESTO) 49-51 MG Take 1 tablet by mouth 2 (two) times daily. 60 tablet 11  . thiamine 100 MG tablet Take 1 tablet (100 mg total) by mouth daily. 30 tablet 1  . Turmeric 500 MG CAPS Take 500 mg by mouth daily.     No current facility-administered medications for this encounter.   BP 118/62   Pulse 75   Wt 87 kg (191 lb 12.8 oz)   SpO2 98%   BMI 29.16 kg/m  General: NAD Neck: No JVD, no thyromegaly  or thyroid nodule.  Lungs: Clear to auscultation bilaterally with normal respiratory effort. CV: Nondisplaced PMI.  Heart regular S1/S2, no S3/S4, no murmur.  No peripheral edema.  No carotid bruit.  Normal pedal pulses.  Abdomen: Soft, nontender, no hepatosplenomegaly, no distention.  Skin: Intact without lesions or rashes.  Neurologic: Alert and oriented x 3.  Psych: Normal affect. Extremities: No clubbing or cyanosis.  HEENT: Normal.   Assessment/Plan: 1. CAD: NSTEMI in 5/21 with  DES to distal RCA.  He was left with occluded OM1 and LAD. CTO procedure was attempted to open LAD, but complicated by LAD perforation and tamponade/cardiac arrest requiring trip to OR and VA ECMO. The LAD was not graftable, he has no perfusion to the LAD territory except by collaterals.  No exertional chest pain.  - With recovery of EF and no thrombus on echo today, warfarin was stopped. Continue ASA 81 daily.  - Continue atorvastatin, check lipids today.  2. Cardiac arrest: In OR after LAD perforation/tamponade.  - With recovery of EF, he is off amiodarone.  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   - Off warfarin with recovery of EF and no thrombus on last echo.  4. Smoking: He remains abstinent.  5. ETOH abuse: He remains abstinent.  6. Chronic systolic CHF: Ischemic cardiomyopathy.  Echo after cardiac arrest in 6/21 showed EF < 20% with moderate RV dysfunction.  Echo in 6/21 after decannulation of ECMO and removal of Impella 5.5 showed EF up to 45-50%.  Echo in 10/21 with EF 50-55%.  On exam, he is not volume overloaded.  NYHA class I, working full time.  - Continue Coreg 3.125 mg bid.  - Continue Entresto 49/51 bid.  - Stop spironolactone.  EF has recovered and he has painful gynecomastia that is likely due to spironolactone.  - He can stop torsemide.  - EF recovered, no ICD needed.  7. Hypertriglyceridemia: Continue Vascepa.  8. HTN: BP controlled.   Followup 6 months  with echo.   Loralie Champagne 08/21/2020

## 2020-08-21 NOTE — Patient Instructions (Addendum)
Labs done today. We will contact you only if your labs are abnormal.   STOP taking Spironolactone  STOP taking Torsemide.  No other medication changes were made. Please continue all current medications as prescribed.  Your physician recommends that you schedule a follow-up appointment in: 6 months with an echo prior to your exam. Please contact our office in August to schedule a September appointment.   Your physician has requested that you have an echocardiogram. Echocardiography is a painless test that uses sound waves to create images of your heart. It provides your doctor with information about the size and shape of your heart and how well your heart's chambers and valves are working. This procedure takes approximately one hour. There are no restrictions for this procedure.  If you have any questions or concerns before your next appointment please send [place] a message through [place] or call our office at [phone].    TO LEAVE A MESSAGE FOR THE NURSE SELECT OPTION 2, PLEASE LEAVE A MESSAGE INCLUDING: . YOUR NAME . DATE OF BIRTH . CALL BACK NUMBER . REASON FOR CALL**this is important as we prioritize the call backs  YOU WILL RECEIVE A CALL BACK THE SAME DAY AS LONG AS YOU CALL BEFORE 4:00 PM   Do the following things EVERYDAY: 1) Weigh yourself in the morning before breakfast. Write it down and keep it in a log. 2) Take your medicines as prescribed 3) Eat low salt foods--Limit salt (sodium) to 2000 mg per day.  4) Stay as active as you can everyday 5) Limit all fluids for the day to less than 2 liters   At the [facility], you and your health needs are our priority. As part of our continuing mission to provide you with exceptional heart care, we have created designated Provider Care Teams. These Care Teams include your primary Cardiologist (physician) and Advanced Practice Providers (APPs- Physician Assistants and Nurse Practitioners) who all work together to  provide you with the care you need, when you need it.   You may see any of the following providers on your designated Care Team at your next follow up: Marland Kitchen Dr Glori Bickers . Dr Loralie Champagne . Darrick Grinder, NP . Lyda Jester, PA . Audry Riles, PharmD   Please be sure to bring in all your medications bottles to every appointment.

## 2020-08-22 ENCOUNTER — Ambulatory Visit: Payer: BLUE CROSS/BLUE SHIELD | Admitting: Cardiology

## 2020-08-22 ENCOUNTER — Encounter: Payer: Self-pay | Admitting: Cardiology

## 2020-08-22 VITALS — BP 120/72 | HR 62 | Ht 68.0 in | Wt 193.0 lb

## 2020-08-22 DIAGNOSIS — I255 Ischemic cardiomyopathy: Secondary | ICD-10-CM

## 2020-08-22 DIAGNOSIS — I214 Non-ST elevation (NSTEMI) myocardial infarction: Secondary | ICD-10-CM

## 2020-08-22 DIAGNOSIS — I513 Intracardiac thrombosis, not elsewhere classified: Secondary | ICD-10-CM

## 2020-08-22 DIAGNOSIS — I5022 Chronic systolic (congestive) heart failure: Secondary | ICD-10-CM

## 2020-08-22 DIAGNOSIS — I10 Essential (primary) hypertension: Secondary | ICD-10-CM

## 2020-08-22 DIAGNOSIS — I2510 Atherosclerotic heart disease of native coronary artery without angina pectoris: Secondary | ICD-10-CM

## 2020-08-22 NOTE — Patient Instructions (Signed)
Medication Instructions:  Your physician recommends that you continue on your current medications as directed. Please refer to the Current Medication list given to you today.  *If you need a refill on your cardiac medications before your next appointment, please call your pharmacy*   Lab Work: NONE If you have labs (blood work) drawn today and your tests are completely normal, you will receive your results only by: Marland Kitchen MyChart Message (if you have MyChart) OR . A paper copy in the mail If you have any lab test that is abnormal or we need to change your treatment, we will call you to review the results.   Testing/Procedures: NONE   Follow-Up: At [facility], you and your health needs are our priority.  As part of our continuing mission to provide you with exceptional heart care, we have created designated Provider Care Teams.  These Care Teams include your primary Cardiologist (physician) and Advanced Practice Providers (APPs -  Physician Assistants and Nurse Practitioners) who all work together to provide you with the care you need, when you need it.  We recommend signing up for the patient portal called "MyChart".  Sign up information is provided on this After Visit Summary.  MyChart is used to connect with patients for Virtual Visits (Telemedicine).  Patients are able to view lab/test results, encounter notes, upcoming appointments, etc.  Non-urgent messages can be sent to your provider as well.   To learn more about what you can do with MyChart, go to [email or web address].    Your next appointment:   1 year(s)  The format for your next appointment:   In Person  Provider:   You may see Sherren Mocha, MD or one of the following Advanced Practice Providers on your designated Care Team:    Richardson Dopp, PA-C  Vin Albers, [place]

## 2020-08-23 ENCOUNTER — Other Ambulatory Visit: Payer: Self-pay

## 2020-08-23 MED ORDER — OXYCODONE-ACETAMINOPHEN 10-325 MG PO TABS: 1.0000 | ORAL_TABLET | Freq: Three times a day (TID) | ORAL | 0 refills | Status: DC | PRN

## 2020-08-23 NOTE — Telephone Encounter (Signed)
Mr Sanmiguel has called multiple times about his medication. His last call indicates he called Dr Aline August cell phone while she was on vacation and reports he can have the medication. I have explained to him that he filled his medication 07/30/20 and should not be refilled until 08/27/20. I asked him if he was taking more than the 3 pills per day and he became very exasperated and raised his voice and said it says TAKE AS NEEDED. I told him it says you may take up to 3 pills per day as needed but not more than that. He is not following the MD order.  Zella Ball has agreed to give him one week of medication and then he can discuss with Dr Ranell Patrick when she is back in the office,.

## 2020-08-23 NOTE — Telephone Encounter (Signed)
PMP was reviewed. Oxycodone was filled on 07/30/2020. William Crawford admitted to taking his  Medication more frequently  Prescribed to [facility] RN, Sybil  reviewed the narcotic policy with him. Dr Ranell Patrick is on vacation. He will be prescribed a week of Oxycodone only and was instructed to call Dr Ranell Patrick next week for refill. He verbalizes understanding. This matter will be discussed with Dr Ranell Patrick.

## 2020-08-23 NOTE — Telephone Encounter (Signed)
Zella Ball Dr Ranell Patrick is out the office this week. Will you please fill William Crawford Oxycodone 10-325 MG? Patient stated he is out of his medication.   Call back ph [phone]

## 2020-08-26 ENCOUNTER — Telehealth: Payer: Self-pay | Admitting: *Deleted

## 2020-08-26 ENCOUNTER — Other Ambulatory Visit (HOSPITAL_COMMUNITY): Payer: Self-pay | Admitting: *Deleted

## 2020-08-26 MED ORDER — ICOSAPENT ETHYL 1 G PO CAPS: 2.0000 g | ORAL_CAPSULE | Freq: Two times a day (BID) | ORAL | 5 refills | Status: DC

## 2020-08-26 NOTE — Telephone Encounter (Signed)
At the request of Dr Ranell Patrick, a formal warning letter is being sent to William Crawford for taking more medication than was prescribed.

## 2020-08-28 ENCOUNTER — Other Ambulatory Visit: Payer: Self-pay

## 2020-08-28 ENCOUNTER — Encounter: Payer: BLUE CROSS/BLUE SHIELD | Attending: Physical Medicine and Rehabilitation | Admitting: Registered Nurse

## 2020-08-28 ENCOUNTER — Encounter: Payer: Self-pay | Admitting: Registered Nurse

## 2020-08-28 VITALS — BP 148/87 | HR 88 | Temp 98.2°F | Ht 68.0 in | Wt 195.0 lb

## 2020-08-28 DIAGNOSIS — Z5181 Encounter for therapeutic drug level monitoring: Secondary | ICD-10-CM | POA: Insufficient documentation

## 2020-08-28 DIAGNOSIS — M7061 Trochanteric bursitis, right hip: Secondary | ICD-10-CM | POA: Insufficient documentation

## 2020-08-28 DIAGNOSIS — M25512 Pain in left shoulder: Secondary | ICD-10-CM | POA: Diagnosis not present

## 2020-08-28 DIAGNOSIS — R0789 Other chest pain: Secondary | ICD-10-CM | POA: Insufficient documentation

## 2020-08-28 DIAGNOSIS — M170 Bilateral primary osteoarthritis of knee: Secondary | ICD-10-CM | POA: Insufficient documentation

## 2020-08-28 DIAGNOSIS — G894 Chronic pain syndrome: Secondary | ICD-10-CM | POA: Diagnosis not present

## 2020-08-28 DIAGNOSIS — Z79891 Long term (current) use of opiate analgesic: Secondary | ICD-10-CM | POA: Diagnosis not present

## 2020-08-28 DIAGNOSIS — G8929 Other chronic pain: Secondary | ICD-10-CM | POA: Insufficient documentation

## 2020-08-28 DIAGNOSIS — M7062 Trochanteric bursitis, left hip: Secondary | ICD-10-CM | POA: Insufficient documentation

## 2020-08-28 DIAGNOSIS — M792 Neuralgia and neuritis, unspecified: Secondary | ICD-10-CM | POA: Diagnosis not present

## 2020-08-28 MED ORDER — OXYCODONE-ACETAMINOPHEN 10-325 MG PO TABS: 1.0000 | ORAL_TABLET | Freq: Three times a day (TID) | ORAL | 0 refills | Status: DC | PRN

## 2020-08-28 NOTE — Progress Notes (Signed)
Subjective:    Patient ID: William Crawford, male    DOB: [birth year], 50 y.o.   MRN: [id number]  HPI: William Crawford is a 50 y.o. male who returns for follow up appointment for chronic pain and medication refill. He  States his pain is located in his left shoulder, bilateral hips L>R and bilateral knee pain. Mr. Dyar states he has chest wall tenderness since his open heart surgery  ( hypersensitivity). He denies any chest pain or SOB. He rates his pain 8. His current exercise regime is walking and performing stretching exercises.  Mr. Peacock Morphine equivalent is 90.00 MME.  UDS ordered today    Pain Inventory Average Pain 9 Pain Right Now 8 My pain is intermittent, constant, sharp, burning and stabbing  In the last 24 hours, has pain interfered with the following? General activity 10 Relation with others 10 Enjoyment of life 10 What TIME of day is your pain at its worst? morning , daytime, evening and night Sleep (in general) Fair  Pain is worse with: walking, bending and some activites Pain improves with: medication Relief from Meds: 9  Family History  Problem Relation Age of Onset  . Heart attack Father    Social History   Socioeconomic History  . Marital status: Single    Spouse name: Not on file  . Number of children: Not on file  . Years of education: Not on file  . Highest education level: Not on file  Occupational History  . Not on file  Tobacco Use  . Smoking status: Former Smoker    Quit date: 10/23/2019    Years since quitting: 0.8  . Smokeless tobacco: Never Used  Vaping Use  . Vaping Use: Never used  Substance and Sexual Activity  . Alcohol use: Yes    Comment: QUIT  IN MAY 89169  . Drug use: No  . Sexual activity: Not on file  Other Topics Concern  . Not on file  Social History Narrative  . Not on file   Social Determinants of Health   Financial Resource Strain: Not on file  Food Insecurity: Not on file  Transportation Needs: Not on  file  Physical Activity: Not on file  Stress: Not on file  Social Connections: Not on file   Past Surgical History:  Procedure Laterality Date  . APPLICATION OF WOUND VAC  10/25/2019   Procedure: Application Of Wound Vac;  Surgeon: [name], MD;  Location: [place];  Service: Open Heart Surgery;;  . CANNULATION FOR ECMO (EXTRACORPOREAL MEMBRANE OXYGENATION)  10/25/2019   Procedure: Cannulation For Ecmo (Extracorporeal Membrane Oxygenation) @ 4503;  Surgeon: [name], MD;  Location: [place];  Service: Open Heart Surgery;;  . CANNULATION FOR ECMO (EXTRACORPOREAL MEMBRANE OXYGENATION) N/A 10/30/2019   Procedure: DECANNULATION OF ECMO (EXTRACORPOREAL MEMBRANE OXYGENATION);  Surgeon: Prescott Gum, Collier Salina, MD;  Location: [place];  Service: Open Heart Surgery;  Laterality: N/A;  Pump standby  . CHEST EXPLORATION  10/25/2019   Procedure: Chest Exploration for repair of coronary perforation (repair of LAD);  Surgeon: Prescott Gum, Collier Salina, MD;  Location: [place];  Service: Open Heart Surgery;;  . CORONARY CTO INTERVENTION N/A 10/25/2019   Procedure: CORONARY CTO INTERVENTION;  Surgeon: Martinique, Peter M, MD;  Location: [place] [facility];  Service: Cardiovascular;  Laterality: N/A;  . CORONARY STENT INTERVENTION N/A 10/17/2019   Procedure: CORONARY STENT INTERVENTION;  Surgeon: Sherren Mocha, MD;  Location: [place] [facility];  Service: Cardiovascular;  Laterality: N/A;  . LEFT HEART CATH AND CORONARY ANGIOGRAPHY N/A 10/17/2019   Procedure: LEFT HEART CATH AND CORONARY ANGIOGRAPHY;  Surgeon: Sherren Mocha, MD;  Location: [place] [facility];  Service: Cardiovascular;  Laterality: N/A;  . MEDIASTERNOTOMY  10/25/2019   Procedure: Median Sternotomy.;  Surgeon: [name], MD;  Location: [place];  Service: Open Heart Surgery;;  . PLACEMENT OF IMPELLA LEFT VENTRICULAR ASSIST DEVICE  10/30/2019   Procedure: Placement Of Impella Left Ventricular Assist Device 5.5 USING 10MM HEMASHIELD PLATINUM GRAFT;  Surgeon: [name], MD;  Location: [place];  Service: Open Heart Surgery;;  graft placed into aorta  . REMOVAL OF IMPELLA LEFT VENTRICULAR ASSIST DEVICE N/A 11/03/2019   Procedure: REMOVAL OF IMPELLA 5.5 LEFT VENTRICULAR ASSIST DEVICE;  Surgeon: [name], MD;  Location: [place];  Service: Open Heart Surgery;  Laterality: N/A;  . STERNAL CLOSURE N/A 10/30/2019   Procedure: Sternal Closure;  Surgeon: [name], MD;  Location: [place];  Service: Open Heart Surgery;  Laterality: N/A;  . STERNAL WIRES REMOVAL N/A 05/14/2020   Procedure: STERNAL WIRES REMOVAL;  Surgeon: [name], MD;  Location: [place];  Service: Thoracic;  Laterality: N/A;  . TEE WITHOUT CARDIOVERSION N/A 10/25/2019   Procedure: TRANSESOPHAGEAL ECHOCARDIOGRAM (TEE);  Surgeon: Prescott Gum, Collier Salina, MD;  Location: [place];  Service: Open Heart Surgery;  Laterality: N/A;  . TEE WITHOUT CARDIOVERSION N/A 10/30/2019   Procedure: TRANSESOPHAGEAL ECHOCARDIOGRAM (TEE);  Surgeon: Prescott Gum, Collier Salina, MD;  Location: [place];  Service: Open Heart Surgery;  Laterality: N/A;  . TEE WITHOUT CARDIOVERSION N/A 11/03/2019   Procedure: TRANSESOPHAGEAL ECHOCARDIOGRAM (TEE);  Surgeon: Prescott Gum, Collier Salina, MD;  Location: [place];  Service: Open Heart Surgery;  Laterality: N/A;   Past Surgical History:  Procedure Laterality Date  . APPLICATION OF WOUND VAC  10/25/2019   Procedure: Application Of Wound Vac;  Surgeon: [name], MD;  Location: [place];  Service: Open Heart Surgery;;  . CANNULATION FOR ECMO (EXTRACORPOREAL MEMBRANE OXYGENATION)  10/25/2019   Procedure: Cannulation For Ecmo (Extracorporeal Membrane Oxygenation) @ 6789;  Surgeon: [name], MD;  Location: [place];  Service: Open Heart Surgery;;  . CANNULATION FOR ECMO (EXTRACORPOREAL MEMBRANE OXYGENATION) N/A 10/30/2019   Procedure: DECANNULATION OF ECMO (EXTRACORPOREAL MEMBRANE OXYGENATION);  Surgeon: Prescott Gum, Collier Salina, MD;  Location: [place];  Service: Open Heart Surgery;  Laterality: N/A;  Pump standby  .  CHEST EXPLORATION  10/25/2019   Procedure: Chest Exploration for repair of coronary perforation (repair of LAD);  Surgeon: Prescott Gum, Collier Salina, MD;  Location: [place];  Service: Open Heart Surgery;;  . CORONARY CTO INTERVENTION N/A 10/25/2019   Procedure: CORONARY CTO INTERVENTION;  Surgeon: Martinique, Peter M, MD;  Location: [place] [facility];  Service: Cardiovascular;  Laterality: N/A;  . CORONARY STENT INTERVENTION N/A 10/17/2019   Procedure: CORONARY STENT INTERVENTION;  Surgeon: Sherren Mocha, MD;  Location: [place] [facility];  Service: Cardiovascular;  Laterality: N/A;  . LEFT HEART CATH AND CORONARY ANGIOGRAPHY N/A 10/17/2019   Procedure: LEFT HEART CATH AND CORONARY ANGIOGRAPHY;  Surgeon: Sherren Mocha, MD;  Location: [place] [facility];  Service: Cardiovascular;  Laterality: N/A;  . MEDIASTERNOTOMY  10/25/2019   Procedure: Median Sternotomy.;  Surgeon: [name], MD;  Location: [place];  Service: Open Heart Surgery;;  . PLACEMENT OF [place] LEFT VENTRICULAR ASSIST DEVICE  10/30/2019   Procedure: Placement Of Impella Left Ventricular Assist Device 5.5 USING 10MM HEMASHIELD PLATINUM  GRAFT;  Surgeon: [name], MD;  Location[facility];  Service: Open Heart Surgery;;  graft placed into aorta  . REMOVAL OF IMPELLA LEFT VENTRICULAR ASSIST DEVICE N/A 11/03/2019   Procedure: REMOVAL OF IMPELLA 5.5 LEFT VENTRICULAR ASSIST DEVICE;  Surgeon: [name], MD;  Location: [place];  Service: Open Heart Surgery;  Laterality: N/A;  . STERNAL CLOSURE N/A 10/30/2019   Procedure: Sternal Closure;  Surgeon: [name], MD;  Location: [place];  Service: Open Heart Surgery;  Laterality: N/A;  . STERNAL WIRES REMOVAL N/A 05/14/2020   Procedure: STERNAL WIRES REMOVAL;  Surgeon: [name], MD;  Location: [place];  Service: Thoracic;  Laterality: N/A;  . TEE WITHOUT CARDIOVERSION N/A 10/25/2019   Procedure: TRANSESOPHAGEAL ECHOCARDIOGRAM (TEE);  Surgeon: Prescott Gum, Collier Salina, MD;  Location: [place];  Service: Open  Heart Surgery;  Laterality: N/A;  . TEE WITHOUT CARDIOVERSION N/A 10/30/2019   Procedure: TRANSESOPHAGEAL ECHOCARDIOGRAM (TEE);  Surgeon: Prescott Gum, Collier Salina, MD;  Location: [place];  Service: Open Heart Surgery;  Laterality: N/A;  . TEE WITHOUT CARDIOVERSION N/A 11/03/2019   Procedure: TRANSESOPHAGEAL ECHOCARDIOGRAM (TEE);  Surgeon: Prescott Gum, Collier Salina, MD;  Location: [place];  Service: Open Heart Surgery;  Laterality: N/A;   Past Medical History:  Diagnosis Date  . Alcohol abuse   . Anxiety   . CAD (coronary artery disease)   . Cardiac arrest ([place])   . Cardiogenic shock ([place])   . CHF (congestive heart failure) ([place])   . Cough   . Dysrhythmia   . GERD (gastroesophageal reflux disease)   . HFrEF (heart failure with reduced ejection fraction) ([place])   . Hypertension   . Hypertriglyceridemia   . Ischemic cardiomyopathy   . Right atrial thrombus   . Tobacco abuse    Temp 98.2 F (36.8 C)   Wt 195 lb (88.5 kg)   BMI 29.65 kg/m   Opioid Risk Score:   Fall Risk Score:  `1  Depression screen PHQ 2/9  Depression screen [facility] 2/9 06/28/2020 05/10/2020  Decreased Interest 0 0  Down, Depressed, Hopeless 0 0  PHQ - 2 Score 0 0  Altered sleeping - 1  Tired, decreased energy - 1  Change in appetite - 0  Feeling bad or failure about yourself  - 0  Trouble concentrating - 0  Moving slowly or fidgety/restless - 0  Suicidal thoughts - 0  PHQ-9 Score - 2  Difficult doing work/chores - Somewhat difficult     Review of Systems  Musculoskeletal: Negative.  Negative for gait problem.       Pain in left shoulder, chest area, both hips, knees  All other systems reviewed and are negative.      Objective:   Physical Exam Vitals and nursing note reviewed.  Constitutional:      Appearance: Normal appearance.  Cardiovascular:     Rate and Rhythm: Normal rate and regular rhythm.     Pulses: Normal pulses.     Heart sounds: Normal heart sounds.  Pulmonary:     Effort: Pulmonary effort is normal.      Breath sounds: Normal breath sounds.  Musculoskeletal:     Cervical back: Normal range of motion and neck supple.     Comments: Normal Muscle Bulk and Muscle Testing Reveals:  Upper Extremities: Full ROM and Muscle Strength 5/5 Chest Wall Hypersensitivity with palpation)  Left Greater Trochanter Tenderness  Lower Extremities: Full ROM and Muscle Strength 5/5 Arises from  Table with ease Narrow  Based Gait   Skin:    General: Skin is warm and dry.  Neurological:     Mental Status: He is alert and oriented to person, place, and time.  Psychiatric:        Mood and Affect: Mood normal.        Behavior: Behavior normal.           Assessment & Plan:  1. Left Shoulder Pain: Continue HEP as Tolerated. Continue current medication regimen. Continue to monitor.  2.Bilateral Greater Trochanter Bursitis: L>R. Schedule for Left Hip Injection with Dr Ranell Patrick. Mr. Orman agrees with the above.  3. Bilateral OA: Continue HEP as Tolerated. Continue current medication regimen.  3. Chest Wall Discomfort/ Neuropathic Pain: Continue Gabapentin. Continue to Monitor.  4. Chronic Pain Syndrome: RX: Oxycodone 10/325 mg one tablet three times a day as needed for pain. Educated on the narcotic policy. He verbalizes understanding. Only take medication as prescribe. Self medicating can lead o discharge from our office. He verbalizes understanding. We will continue the opioid monitoring program, this consists of regular clinic visits, examinations, urine drug screen, pill counts as well as use of [place] Controlled Substance Reporting system. A 12 month History has been reviewed on the [place] Controlled Substance Reporting System on 08/28/2020.   F/U with Dr. Ranell Patrick

## 2020-09-03 ENCOUNTER — Encounter: Payer: Self-pay | Admitting: Physical Medicine and Rehabilitation

## 2020-09-03 ENCOUNTER — Ambulatory Visit: Payer: BLUE CROSS/BLUE SHIELD | Admitting: Physical Medicine and Rehabilitation

## 2020-09-03 ENCOUNTER — Other Ambulatory Visit: Payer: Self-pay

## 2020-09-03 ENCOUNTER — Encounter (HOSPITAL_BASED_OUTPATIENT_CLINIC_OR_DEPARTMENT_OTHER): Payer: BLUE CROSS/BLUE SHIELD | Admitting: Physical Medicine and Rehabilitation

## 2020-09-03 VITALS — BP 109/73 | HR 76 | Temp 98.6°F | Ht 68.0 in | Wt 195.0 lb

## 2020-09-03 DIAGNOSIS — M7062 Trochanteric bursitis, left hip: Secondary | ICD-10-CM | POA: Diagnosis not present

## 2020-09-03 DIAGNOSIS — Z5181 Encounter for therapeutic drug level monitoring: Secondary | ICD-10-CM | POA: Diagnosis not present

## 2020-09-03 DIAGNOSIS — M25512 Pain in left shoulder: Secondary | ICD-10-CM | POA: Diagnosis not present

## 2020-09-03 DIAGNOSIS — M7061 Trochanteric bursitis, right hip: Secondary | ICD-10-CM | POA: Diagnosis not present

## 2020-09-03 DIAGNOSIS — M170 Bilateral primary osteoarthritis of knee: Secondary | ICD-10-CM | POA: Diagnosis not present

## 2020-09-03 DIAGNOSIS — M792 Neuralgia and neuritis, unspecified: Secondary | ICD-10-CM | POA: Diagnosis not present

## 2020-09-03 DIAGNOSIS — G894 Chronic pain syndrome: Secondary | ICD-10-CM | POA: Diagnosis not present

## 2020-09-03 DIAGNOSIS — R0789 Other chest pain: Secondary | ICD-10-CM | POA: Diagnosis not present

## 2020-09-03 DIAGNOSIS — Z79891 Long term (current) use of opiate analgesic: Secondary | ICD-10-CM | POA: Diagnosis not present

## 2020-09-03 DIAGNOSIS — G8929 Other chronic pain: Secondary | ICD-10-CM | POA: Diagnosis not present

## 2020-09-03 NOTE — Progress Notes (Signed)
Trochanteric bursa injection, left  Indication Trochanteric bursitis. Exam has tenderness over the greater trochanter of the hip. Pain has not responded to conservative care such as exercise therapy and oral medications. Pain interferes with sleep or with mobility Informed consent was obtained after describing risks and benefits of the procedure with the patient these include bleeding bruising and infection. Patient has signed written consent form. Patient placed in a lateral decubitus position with the affected hip superior. Point of maximal pain was palpated marked and prepped with Betadine and entered with a needle to bone contact. Needle slightly withdrawn then 72m celestone with 3 cc 1% lidocaine were injected. Patient tolerated procedure well. Post procedure instructions given.

## 2020-09-03 NOTE — Patient Instructions (Signed)
-  Discussed following foods that may reduce pain: 1) Ginger 2) Blueberries 3) Salmon 4) Pumpkin seeds 5) dark chocolate 6) turmeric 7) tart cherries 8) virgin olive oil 9) chilli peppers 10) mint 11) garlic  Link to further information on diet for chronic pain: [email or web address]

## 2020-09-04 ENCOUNTER — Encounter: Payer: Self-pay | Admitting: Physical Medicine and Rehabilitation

## 2020-09-04 ENCOUNTER — Telehealth: Payer: Self-pay | Admitting: *Deleted

## 2020-09-04 LAB — TOXASSURE SELECT,+ANTIDEPR,UR

## 2020-09-04 NOTE — Telephone Encounter (Signed)
Urine drug screen for this encounter is consistent for prescribed medication 

## 2020-09-05 ENCOUNTER — Other Ambulatory Visit (HOSPITAL_COMMUNITY): Payer: Self-pay | Admitting: Internal Medicine

## 2020-09-19 ENCOUNTER — Other Ambulatory Visit (HOSPITAL_COMMUNITY): Payer: Self-pay | Admitting: Cardiology

## 2020-09-25 ENCOUNTER — Encounter: Payer: BLUE CROSS/BLUE SHIELD | Attending: Physical Medicine and Rehabilitation | Admitting: Registered Nurse

## 2020-09-25 ENCOUNTER — Encounter: Payer: Self-pay | Admitting: Registered Nurse

## 2020-09-25 ENCOUNTER — Other Ambulatory Visit: Payer: Self-pay

## 2020-09-25 VITALS — BP 130/85 | HR 92 | Temp 99.1°F | Ht 68.0 in | Wt 193.2 lb

## 2020-09-25 DIAGNOSIS — M25512 Pain in left shoulder: Secondary | ICD-10-CM | POA: Diagnosis not present

## 2020-09-25 DIAGNOSIS — M7061 Trochanteric bursitis, right hip: Secondary | ICD-10-CM | POA: Insufficient documentation

## 2020-09-25 DIAGNOSIS — G8929 Other chronic pain: Secondary | ICD-10-CM | POA: Diagnosis not present

## 2020-09-25 DIAGNOSIS — M792 Neuralgia and neuritis, unspecified: Secondary | ICD-10-CM | POA: Diagnosis not present

## 2020-09-25 DIAGNOSIS — M545 Low back pain, unspecified: Secondary | ICD-10-CM | POA: Diagnosis not present

## 2020-09-25 DIAGNOSIS — G894 Chronic pain syndrome: Secondary | ICD-10-CM | POA: Diagnosis not present

## 2020-09-25 DIAGNOSIS — Z79899 Other long term (current) drug therapy: Secondary | ICD-10-CM | POA: Insufficient documentation

## 2020-09-25 DIAGNOSIS — M7062 Trochanteric bursitis, left hip: Secondary | ICD-10-CM | POA: Insufficient documentation

## 2020-09-25 DIAGNOSIS — Z5181 Encounter for therapeutic drug level monitoring: Secondary | ICD-10-CM | POA: Diagnosis not present

## 2020-09-25 MED ORDER — OXYCODONE-ACETAMINOPHEN 10-325 MG PO TABS: 1.0000 | ORAL_TABLET | Freq: Four times a day (QID) | ORAL | 0 refills | Status: DC | PRN

## 2020-09-25 MED ORDER — GABAPENTIN 600 MG PO TABS: 600.0000 mg | ORAL_TABLET | Freq: Three times a day (TID) | ORAL | 3 refills | Status: DC

## 2020-09-25 NOTE — Progress Notes (Signed)
Subjective:    Patient ID: William Crawford, male    DOB: November 01, 1970, 50 y.o.   MRN: [id number]  HPI: William Crawford is a 50 y.o. male who returns for follow up appointment for chronic pain and medication refill. He states his pain is located in his left shoulder, bilateral hips L>R and bilateral knee pain. He also reports his medication is not controlling his pain,  He's only receiving a few hors of relief of his pain with hs currt medication regimen. William Crawford states he was in contact with Dr Ranell Patrick. He reports Dr Ranell Patrick was gong to increase his oxycodone to 4 times a day as needed for pain. This provider spoke with Dr Ranell Patrick, she agrees to increase William Crawford oxycodone to 4 times a day as needed for pain. He rates his pain 9. His  current exercise regime is walking and performing stretching exercises.  William Crawford Morphine equivalent is 45.00 MME.  Last UDS was Performed on 08/28/2020, it was consistent.   Pain Inventory Average Pain 10 Pain Right Now 9 My pain is constant, sharp, burning and stabbing  In the last 24 hours, has pain interfered with the following? General activity 10 Relation with others 0 Enjoyment of life 7 What TIME of day is your pain at its worst? daytime Sleep (in general) Fair  Pain is worse with: walking, bending, standing and some activites Pain improves with: rest and medication Relief from Meds: 7  Family History  Problem Relation Age of Onset  . Heart attack Father    Social History   Socioeconomic History  . Marital status: Single    Spouse name: Not on file  . Number of children: Not on file  . Years of education: Not on file  . Highest education level: Not on file  Occupational History  . Not on file  Tobacco Use  . Smoking status: Former Smoker    Quit date: 10/23/2019    Years since quitting: 0.9  . Smokeless tobacco: Never Used  Vaping Use  . Vaping Use: Never used  Substance and Sexual Activity  . Alcohol use: Yes     Comment: QUIT  IN MAY 17915  . Drug use: No  . Sexual activity: Not on file  Other Topics Concern  . Not on file  Social History Narrative  . Not on file   Social Determinants of Health   Financial Resource Strain: Not on file  Food Insecurity: Not on file  Transportation Needs: Not on file  Physical Activity: Not on file  Stress: Not on file  Social Connections: Not on file   Past Surgical History:  Procedure Laterality Date  . APPLICATION OF WOUND VAC  10/25/2019   Procedure: Application Of Wound Vac;  Surgeon: [name], MD;  Location: [place];  Service: Open Heart Surgery;;  . CANNULATION FOR ECMO (EXTRACORPOREAL MEMBRANE OXYGENATION)  10/25/2019   Procedure: Cannulation For Ecmo (Extracorporeal Membrane Oxygenation) @ 0569;  Surgeon: [name], MD;  Location: [place];  Service: Open Heart Surgery;;  . CANNULATION FOR ECMO (EXTRACORPOREAL MEMBRANE OXYGENATION) N/A 10/30/2019   Procedure: DECANNULATION OF ECMO (EXTRACORPOREAL MEMBRANE OXYGENATION);  Surgeon: Prescott Gum, Collier Salina, MD;  Location: [place];  Service: Open Heart Surgery;  Laterality: N/A;  Pump standby  . CHEST EXPLORATION  10/25/2019   Procedure: Chest Exploration for repair of coronary perforation (repair of LAD);  Surgeon: Prescott Gum, Collier Salina, MD;  [facility];  Service: Open Heart Surgery;;  .  CORONARY CTO INTERVENTION N/A 10/25/2019   Procedure: CORONARY CTO INTERVENTION;  Surgeon: Martinique, Peter M, MD;  Location: [place] [facility];  Service: Cardiovascular;  Laterality: N/A;  . CORONARY STENT INTERVENTION N/A 10/17/2019   Procedure: CORONARY STENT INTERVENTION;  Surgeon: Sherren Mocha, MD;  Location: [place] [facility];  Service: Cardiovascular;  Laterality: N/A;  . LEFT HEART CATH AND CORONARY ANGIOGRAPHY N/A 10/17/2019   Procedure: LEFT HEART CATH AND CORONARY ANGIOGRAPHY;  Surgeon: Sherren Mocha, MD;  Location: [place] [facility];  Service: Cardiovascular;  Laterality: N/A;  . MEDIASTERNOTOMY  10/25/2019    Procedure: Median Sternotomy.;  Surgeon: [name], MD;  Location: [place];  Service: Open Heart Surgery;;  . PLACEMENT OF IMPELLA LEFT VENTRICULAR ASSIST DEVICE  10/30/2019   Procedure: Placement Of Impella Left Ventricular Assist Device 5.5 USING 10MM HEMASHIELD PLATINUM GRAFT;  Surgeon: [name], MD;  Location: [place];  Service: Open Heart Surgery;;  graft placed into aorta  . REMOVAL OF IMPELLA LEFT VENTRICULAR ASSIST DEVICE N/A 11/03/2019   Procedure: REMOVAL OF IMPELLA 5.5 LEFT VENTRICULAR ASSIST DEVICE;  Surgeon: [name], MD;  Location: [place];  Service: Open Heart Surgery;  Laterality: N/A;  . STERNAL CLOSURE N/A 10/30/2019   Procedure: Sternal Closure;  Surgeon: [name], MD;  Location: [place];  Service: Open Heart Surgery;  Laterality: N/A;  . STERNAL WIRES REMOVAL N/A 05/14/2020   Procedure: STERNAL WIRES REMOVAL;  Surgeon: [name], MD;  Location: [place];  Service: Thoracic;  Laterality: N/A;  . TEE WITHOUT CARDIOVERSION N/A 10/25/2019   Procedure: TRANSESOPHAGEAL ECHOCARDIOGRAM (TEE);  Surgeon: Prescott Gum, Collier Salina, MD;  Location: [place];  Service: Open Heart Surgery;  Laterality: N/A;  . TEE WITHOUT CARDIOVERSION N/A 10/30/2019   Procedure: TRANSESOPHAGEAL ECHOCARDIOGRAM (TEE);  Surgeon: Prescott Gum, Collier Salina, MD;  Location: [place];  Service: Open Heart Surgery;  Laterality: N/A;  . TEE WITHOUT CARDIOVERSION N/A 11/03/2019   Procedure: TRANSESOPHAGEAL ECHOCARDIOGRAM (TEE);  Surgeon: Prescott Gum, Collier Salina, MD;  Location: [place];  Service: Open Heart Surgery;  Laterality: N/A;   Past Surgical History:  Procedure Laterality Date  . APPLICATION OF WOUND VAC  10/25/2019   Procedure: Application Of Wound Vac;  Surgeon: [name], MD;  Location: [place];  Service: Open Heart Surgery;;  . CANNULATION FOR ECMO (EXTRACORPOREAL MEMBRANE OXYGENATION)  10/25/2019   Procedure: Cannulation For Ecmo (Extracorporeal Membrane Oxygenation) @ 1610;  Surgeon: [name], MD;  Location: [place];  Service: Open Heart Surgery;;  . CANNULATION FOR ECMO (EXTRACORPOREAL MEMBRANE OXYGENATION) N/A 10/30/2019   Procedure: DECANNULATION OF ECMO (EXTRACORPOREAL MEMBRANE OXYGENATION);  Surgeon: Prescott Gum, Collier Salina, MD;  Location: [place];  Service: Open Heart Surgery;  Laterality: N/A;  Pump standby  . CHEST EXPLORATION  10/25/2019   Procedure: Chest Exploration for repair of coronary perforation (repair of LAD);  Surgeon: Prescott Gum, Collier Salina, MD;  Location: [place];  Service: Open Heart Surgery;;  . CORONARY CTO INTERVENTION N/A 10/25/2019   Procedure: CORONARY CTO INTERVENTION;  Surgeon: Martinique, Peter M, MD;  Location: [place] [facility];  Service: Cardiovascular;  Laterality: N/A;  . CORONARY STENT INTERVENTION N/A 10/17/2019   Procedure: CORONARY STENT INTERVENTION;  Surgeon: Sherren Mocha, MD;  Location: [place] [facility];  Service: Cardiovascular;  Laterality: N/A;  . LEFT HEART CATH AND CORONARY ANGIOGRAPHY N/A 10/17/2019   Procedure: LEFT HEART CATH AND CORONARY ANGIOGRAPHY;  Surgeon: Sherren Mocha, MD;  Location: [place] [facility];  Service: Cardiovascular;  Laterality: N/A;  . MEDIASTERNOTOMY  10/25/2019   Procedure: Median Sternotomy.;  Surgeon: [name], MD;  Location: [place];  Service: Open Heart Surgery;;  . PLACEMENT OF IMPELLA LEFT VENTRICULAR ASSIST DEVICE  10/30/2019   Procedure: Placement Of Impella Left Ventricular Assist Device 5.5 USING 10MM HEMASHIELD PLATINUM GRAFT;  Surgeon: [name], MD;  Location: [place];  Service: Open Heart Surgery;;  graft placed into aorta  . REMOVAL OF IMPELLA LEFT VENTRICULAR ASSIST DEVICE N/A 11/03/2019   Procedure: REMOVAL OF IMPELLA 5.5 LEFT VENTRICULAR ASSIST DEVICE;  Surgeon: [name], MD;  Location: [place];  Service: Open Heart Surgery;  Laterality: N/A;  . STERNAL CLOSURE N/A 10/30/2019   Procedure: Sternal Closure;  Surgeon: [name], MD;  Location: [place];  Service: Open Heart Surgery;  Laterality: N/A;  . STERNAL WIRES REMOVAL  N/A 05/14/2020   Procedure: STERNAL WIRES REMOVAL;  Surgeon: [name], MD;  Location: [place];  Service: Thoracic;  Laterality: N/A;  . TEE WITHOUT CARDIOVERSION N/A 10/25/2019   Procedure: TRANSESOPHAGEAL ECHOCARDIOGRAM (TEE);  Surgeon: Prescott Gum, Collier Salina, MD;  Location: [place];  Service: Open Heart Surgery;  Laterality: N/A;  . TEE WITHOUT CARDIOVERSION N/A 10/30/2019   Procedure: TRANSESOPHAGEAL ECHOCARDIOGRAM (TEE);  Surgeon: Prescott Gum, Collier Salina, MD;  Location: [place];  Service: Open Heart Surgery;  Laterality: N/A;  . TEE WITHOUT CARDIOVERSION N/A 11/03/2019   Procedure: TRANSESOPHAGEAL ECHOCARDIOGRAM (TEE);  Surgeon: Prescott Gum, Collier Salina, MD;  Location: [place];  Service: Open Heart Surgery;  Laterality: N/A;   Past Medical History:  Diagnosis Date  . Alcohol abuse   . Anxiety   . CAD (coronary artery disease)   . Cardiac arrest ([place])   . Cardiogenic shock ([place])   . CHF (congestive heart failure) ([place])   . Cough   . Dysrhythmia   . GERD (gastroesophageal reflux disease)   . HFrEF (heart failure with reduced ejection fraction) ([place])   . Hypertension   . Hypertriglyceridemia   . Ischemic cardiomyopathy   . Right atrial thrombus   . Tobacco abuse    BP 130/85   Pulse 92   Temp 99.1 F (37.3 C)   Ht 5' 8"  (1.727 m)   Wt 193 lb 3.2 oz (87.6 kg)   SpO2 97%   BMI 29.38 kg/m   Opioid Risk Score:   Fall Risk Score:  `1  Depression screen PHQ 2/9  Depression screen [facility] 2/9 09/03/2020 08/28/2020 06/28/2020 05/10/2020  Decreased Interest 0 0 0 0  Down, Depressed, Hopeless 0 0 0 0  PHQ - 2 Score 0 0 0 0  Altered sleeping - - - 1  Tired, decreased energy - - - 1  Change in appetite - - - 0  Feeling bad or failure about yourself  - - - 0  Trouble concentrating - - - 0  Moving slowly or fidgety/restless - - - 0  Suicidal thoughts - - - 0  PHQ-9 Score - - - 2  Difficult doing work/chores - - - Somewhat difficult   Review of Systems  Musculoskeletal: Positive for arthralgias and gait  problem.       Pain shoulders, hips, knees & chest  All other systems reviewed and are negative.      Objective:   Physical Exam Vitals and nursing note reviewed.  Constitutional:      Appearance: Normal appearance.  Cardiovascular:     Rate and Rhythm: Normal rate and regular rhythm.  Pulses: Normal pulses.     Heart sounds: Normal heart sounds.  Pulmonary:     Effort: Pulmonary effort is normal.     Breath sounds: Normal breath sounds.  Musculoskeletal:     Cervical back: Normal range of motion and neck supple.     Comments: Normal Muscle Bulk and Muscle Testing Reveals:  Upper Extremities: Right: Full ROM and Muscle Strength 5/5 Left Upper Extremity: Decreased ROM 90 Degrees and  Muscle Strength  5/5 Left AC Joint Tenderness  Lower Extremities: Full ROM and Muscle Strength 5/5 Arises from Table with ease Narrow Based Gait   Skin:    General: Skin is warm and dry.  Neurological:     Mental Status: He is alert and oriented to person, place, and time.  Psychiatric:        Mood and Affect: Mood normal.        Behavior: Behavior normal.           Assessment & Plan:  1. Left Shoulder Pain: Continue HEP as Tolerated. Continue current medication regimen. Continue to monitor. 09/25/2020 2.Bilateral Greater Trochanter Bursitis: L>R. S/P Left Hip Injection with Dr Ranell Patrick, no relief noted he reports, Continue to alternate with Heat an Ice Therapy. Continue to monitor. 09/25/2020 3. Bilateral OA: Continue HEP as Tolerated. Continue current medication regimen. 09/25/2020 3. Chest Wall Discomfort/ Neuropathic Pain: No complaints today, Continue Gabapentin. Continue to Monitor.  4. Chronic Pain Syndrome: Increased: Refilled: Oxycodone 10/325 mg one tablet 4 times a day as needed for pain. We will continue the opioid monitoring program, this consists of regular clinic visits, examinations, urine drug screen, pill counts as well as use of [place] Controlled Substance  Reporting system. A 12 month History has been reviewed on the [place] Controlled Substance Reporting System on 09/25/2020.   F/U in 1 month

## 2020-09-30 ENCOUNTER — Ambulatory Visit: Payer: BLUE CROSS/BLUE SHIELD | Admitting: Physical Medicine and Rehabilitation

## 2020-10-23 ENCOUNTER — Encounter: Payer: BLUE CROSS/BLUE SHIELD | Admitting: Registered Nurse

## 2020-10-25 ENCOUNTER — Encounter: Payer: BLUE CROSS/BLUE SHIELD | Attending: Physical Medicine and Rehabilitation | Admitting: Registered Nurse

## 2020-10-25 ENCOUNTER — Ambulatory Visit
Admission: RE | Admit: 2020-10-25 | Discharge: 2020-10-25 | Disposition: A | Discharge status: Home / Self Care | Payer: BLUE CROSS/BLUE SHIELD | Source: Ambulatory Visit | Attending: Registered Nurse | Admitting: Registered Nurse

## 2020-10-25 ENCOUNTER — Other Ambulatory Visit: Payer: Self-pay

## 2020-10-25 ENCOUNTER — Encounter: Payer: Self-pay | Admitting: Registered Nurse

## 2020-10-25 VITALS — BP 126/77 | HR 73 | Temp 97.9°F | Ht 68.0 in | Wt 192.4 lb

## 2020-10-25 DIAGNOSIS — G894 Chronic pain syndrome: Secondary | ICD-10-CM | POA: Diagnosis not present

## 2020-10-25 DIAGNOSIS — M170 Bilateral primary osteoarthritis of knee: Secondary | ICD-10-CM | POA: Diagnosis not present

## 2020-10-25 DIAGNOSIS — M7062 Trochanteric bursitis, left hip: Secondary | ICD-10-CM | POA: Diagnosis present

## 2020-10-25 DIAGNOSIS — Z5181 Encounter for therapeutic drug level monitoring: Secondary | ICD-10-CM | POA: Insufficient documentation

## 2020-10-25 DIAGNOSIS — M25572 Pain in left ankle and joints of left foot: Secondary | ICD-10-CM | POA: Insufficient documentation

## 2020-10-25 DIAGNOSIS — Z79899 Other long term (current) drug therapy: Secondary | ICD-10-CM | POA: Diagnosis present

## 2020-10-25 DIAGNOSIS — M7061 Trochanteric bursitis, right hip: Secondary | ICD-10-CM | POA: Insufficient documentation

## 2020-10-25 MED ORDER — OXYCODONE-ACETAMINOPHEN 10-325 MG PO TABS: 1.0000 | ORAL_TABLET | Freq: Four times a day (QID) | ORAL | 0 refills | Status: DC | PRN

## 2020-10-25 NOTE — Progress Notes (Signed)
Subjective:    Patient ID: William Crawford, male    DOB: 10/17/1970, 50 y.o.   MRN: [id number]  HPI: William Crawford is a 50 y.o. male who returns for follow up appointment for chronic pain and medication refill. He states his pain is located in his bilateral hips L.R,  Bilateral knees an left ankle pain. He reports his left ankle pain began two weeks ago, he denies falling, we will obtain a X-ray, he verbalizes understanding.  He rates his pain 6. His current exercise regime is walking and performing stretching exercises.  Mr. Helzer Morphine equivalent is 60.00  MME.  Last UDS was 08/28/2020, it was consistent.   Pain Inventory Average Pain 8 Pain Right Now 6 My pain is sharp, burning, stabbing and tingling  In the last 24 hours, has pain interfered with the following? General activity 8 Relation with others 8 Enjoyment of life 8 What TIME of day is your pain at its worst? morning  and daytime Sleep (in general) Fair  Pain is worse with: walking, bending and some activites Pain improves with: rest and medication Relief from Meds: 9  Family History  Problem Relation Age of Onset  . Heart attack Father    Social History   Socioeconomic History  . Marital status: Single    Spouse name: Not on file  . Number of children: Not on file  . Years of education: Not on file  . Highest education level: Not on file  Occupational History  . Not on file  Tobacco Use  . Smoking status: Former Smoker    Quit date: 10/23/2019    Years since quitting: 1.0  . Smokeless tobacco: Never Used  Vaping Use  . Vaping Use: Never used  Substance and Sexual Activity  . Alcohol use: Yes    Comment: QUIT  IN MAY 11657  . Drug use: No  . Sexual activity: Not on file  Other Topics Concern  . Not on file  Social History Narrative  . Not on file   Social Determinants of Health   Financial Resource Strain: Not on file  Food Insecurity: Not on file  Transportation Needs: Not on file   Physical Activity: Not on file  Stress: Not on file  Social Connections: Not on file   Past Surgical History:  Procedure Laterality Date  . APPLICATION OF WOUND VAC  10/25/2019   Procedure: Application Of Wound Vac;  Surgeon: [name], MD;  Location: [place];  Service: Open Heart Surgery;;  . CANNULATION FOR ECMO (EXTRACORPOREAL MEMBRANE OXYGENATION)  10/25/2019   Procedure: Cannulation For Ecmo (Extracorporeal Membrane Oxygenation) @ 9038;  Surgeon: [name], MD;  Location: [place];  Service: Open Heart Surgery;;  . CANNULATION FOR ECMO (EXTRACORPOREAL MEMBRANE OXYGENATION) N/A 10/30/2019   Procedure: DECANNULATION OF ECMO (EXTRACORPOREAL MEMBRANE OXYGENATION);  Surgeon: Prescott Gum, Collier Salina, MD;  Location: [place];  Service: Open Heart Surgery;  Laterality: N/A;  Pump standby  . CHEST EXPLORATION  10/25/2019   Procedure: Chest Exploration for repair of coronary perforation (repair of LAD);  Surgeon: Prescott Gum, Collier Salina, MD;  Location: [place];  Service: Open Heart Surgery;;  . CORONARY CTO INTERVENTION N/A 10/25/2019   Procedure: CORONARY CTO INTERVENTION;  Surgeon: Martinique, Peter M, MD;  Location: [place] [facility];  Service: Cardiovascular;  Laterality: N/A;  . CORONARY STENT INTERVENTION N/A 10/17/2019   Procedure: CORONARY STENT INTERVENTION;  Surgeon: Sherren Mocha, MD;  Location: [place] [facility];  Service:  Cardiovascular;  Laterality: N/A;  . LEFT HEART CATH AND CORONARY ANGIOGRAPHY N/A 10/17/2019   Procedure: LEFT HEART CATH AND CORONARY ANGIOGRAPHY;  Surgeon: Sherren Mocha, MD;  Location: [place] [facility];  Service: Cardiovascular;  Laterality: N/A;  . MEDIASTERNOTOMY  10/25/2019   Procedure: Median Sternotomy.;  Surgeon: [name], MD;  Location: [place];  Service: Open Heart Surgery;;  . PLACEMENT OF IMPELLA LEFT VENTRICULAR ASSIST DEVICE  10/30/2019   Procedure: Placement Of Impella Left Ventricular Assist Device 5.5 USING 10MM HEMASHIELD PLATINUM GRAFT;  Surgeon: [name], MD;  Location: [place];  Service: Open Heart Surgery;;  graft placed into aorta  . REMOVAL OF IMPELLA LEFT VENTRICULAR ASSIST DEVICE N/A 11/03/2019   Procedure: REMOVAL OF IMPELLA 5.5 LEFT VENTRICULAR ASSIST DEVICE;  Surgeon: [name], MD;  Location: [place];  Service: Open Heart Surgery;  Laterality: N/A;  . STERNAL CLOSURE N/A 10/30/2019   Procedure: Sternal Closure;  Surgeon: [name], MD;  Location: [place];  Service: Open Heart Surgery;  Laterality: N/A;  . STERNAL WIRES REMOVAL N/A 05/14/2020   Procedure: STERNAL WIRES REMOVAL;  Surgeon: [name], MD;  Location: [place];  Service: Thoracic;  Laterality: N/A;  . TEE WITHOUT CARDIOVERSION N/A 10/25/2019   Procedure: TRANSESOPHAGEAL ECHOCARDIOGRAM (TEE);  Surgeon: Prescott Gum, Collier Salina, MD;  Location: [place];  Service: Open Heart Surgery;  Laterality: N/A;  . TEE WITHOUT CARDIOVERSION N/A 10/30/2019   Procedure: TRANSESOPHAGEAL ECHOCARDIOGRAM (TEE);  Surgeon: Prescott Gum, Collier Salina, MD;  Location: [place];  Service: Open Heart Surgery;  Laterality: N/A;  . TEE WITHOUT CARDIOVERSION N/A 11/03/2019   Procedure: TRANSESOPHAGEAL ECHOCARDIOGRAM (TEE);  Surgeon: Prescott Gum, Collier Salina, MD;  Location: [place];  Service: Open Heart Surgery;  Laterality: N/A;   Past Surgical History:  Procedure Laterality Date  . APPLICATION OF WOUND VAC  10/25/2019   Procedure: Application Of Wound Vac;  Surgeon: [name], MD;  Location: [place];  Service: Open Heart Surgery;;  . CANNULATION FOR ECMO (EXTRACORPOREAL MEMBRANE OXYGENATION)  10/25/2019   Procedure: Cannulation For Ecmo (Extracorporeal Membrane Oxygenation) @ 6073;  Surgeon: [name], MD;  Location: [place];  Service: Open Heart Surgery;;  . CANNULATION FOR ECMO (EXTRACORPOREAL MEMBRANE OXYGENATION) N/A 10/30/2019   Procedure: DECANNULATION OF ECMO (EXTRACORPOREAL MEMBRANE OXYGENATION);  Surgeon: Prescott Gum, Collier Salina, MD;  Location: [place];  Service: Open Heart Surgery;  Laterality: N/A;  Pump standby  .  CHEST EXPLORATION  10/25/2019   Procedure: Chest Exploration for repair of coronary perforation (repair of LAD);  Surgeon: Prescott Gum, Collier Salina, MD;  Location: [place];  Service: Open Heart Surgery;;  . CORONARY CTO INTERVENTION N/A 10/25/2019   Procedure: CORONARY CTO INTERVENTION;  Surgeon: Martinique, Peter M, MD;  Location: [place] [facility];  Service: Cardiovascular;  Laterality: N/A;  . CORONARY STENT INTERVENTION N/A 10/17/2019   Procedure: CORONARY STENT INTERVENTION;  Surgeon: Sherren Mocha, MD;  Location: [place] [facility];  Service: Cardiovascular;  Laterality: N/A;  . LEFT HEART CATH AND CORONARY ANGIOGRAPHY N/A 10/17/2019   Procedure: LEFT HEART CATH AND CORONARY ANGIOGRAPHY;  Surgeon: Sherren Mocha, MD;  Location: [place] [facility];  Service: Cardiovascular;  Laterality: N/A;  . MEDIASTERNOTOMY  10/25/2019   Procedure: Median Sternotomy.;  Surgeon: [name], MD;  Location: [place];  Service: Open Heart Surgery;;  . PLACEMENT OF [place] LEFT VENTRICULAR ASSIST DEVICE  10/30/2019   Procedure: Placement Of Impella Left Ventricular Assist Device 5.5 USING 10MM  HEMASHIELD PLATINUM GRAFT;  Surgeon: [name], MD;  Location[facility];  Service: Open Heart Surgery;;  graft placed into aorta  . REMOVAL OF IMPELLA LEFT VENTRICULAR ASSIST DEVICE N/A 11/03/2019   Procedure: REMOVAL OF IMPELLA 5.5 LEFT VENTRICULAR ASSIST DEVICE;  Surgeon: [name], MD;  Location: [place];  Service: Open Heart Surgery;  Laterality: N/A;  . STERNAL CLOSURE N/A 10/30/2019   Procedure: Sternal Closure;  Surgeon: [name], MD;  Location: [place];  Service: Open Heart Surgery;  Laterality: N/A;  . STERNAL WIRES REMOVAL N/A 05/14/2020   Procedure: STERNAL WIRES REMOVAL;  Surgeon: [name], MD;  Location: [place];  Service: Thoracic;  Laterality: N/A;  . TEE WITHOUT CARDIOVERSION N/A 10/25/2019   Procedure: TRANSESOPHAGEAL ECHOCARDIOGRAM (TEE);  Surgeon: Prescott Gum, Collier Salina, MD;  Location: [place];  Service: Open  Heart Surgery;  Laterality: N/A;  . TEE WITHOUT CARDIOVERSION N/A 10/30/2019   Procedure: TRANSESOPHAGEAL ECHOCARDIOGRAM (TEE);  Surgeon: Prescott Gum, Collier Salina, MD;  Location: [place];  Service: Open Heart Surgery;  Laterality: N/A;  . TEE WITHOUT CARDIOVERSION N/A 11/03/2019   Procedure: TRANSESOPHAGEAL ECHOCARDIOGRAM (TEE);  Surgeon: Prescott Gum, Collier Salina, MD;  Location: [place];  Service: Open Heart Surgery;  Laterality: N/A;   Past Medical History:  Diagnosis Date  . Alcohol abuse   . Anxiety   . CAD (coronary artery disease)   . Cardiac arrest ([place])   . Cardiogenic shock ([place])   . CHF (congestive heart failure) ([place])   . Cough   . Dysrhythmia   . GERD (gastroesophageal reflux disease)   . HFrEF (heart failure with reduced ejection fraction) ([place])   . Hypertension   . Hypertriglyceridemia   . Ischemic cardiomyopathy   . Right atrial thrombus   . Tobacco abuse    BP 126/77   Pulse 73   Temp 97.9 F (36.6 C)   Ht 5' 8"  (1.727 m)   Wt 192 lb 6.4 oz (87.3 kg)   SpO2 98%   BMI 29.25 kg/m   Opioid Risk Score:   Fall Risk Score:  `1  Depression screen PHQ 2/9  Depression screen [facility] 2/9 10/25/2020 09/25/2020 09/03/2020 08/28/2020 06/28/2020 05/10/2020  Decreased Interest 0 0 0 0 0 0  Down, Depressed, Hopeless 0 0 0 0 0 0  PHQ - 2 Score 0 0 0 0 0 0  Altered sleeping - - - - - 1  Tired, decreased energy - - - - - 1  Change in appetite - - - - - 0  Feeling bad or failure about yourself  - - - - - 0  Trouble concentrating - - - - - 0  Moving slowly or fidgety/restless - - - - - 0  Suicidal thoughts - - - - - 0  PHQ-9 Score - - - - - 2  Difficult doing work/chores - - - - - Somewhat difficult    Review of Systems  Constitutional: Negative.   HENT: Negative.   Eyes: Negative.   Respiratory: Negative.   Cardiovascular: Negative.   Gastrointestinal: Negative.   Endocrine: Negative.   Genitourinary: Negative.   Musculoskeletal:       Left shoulder, chest left hip left foot and both knees   Skin: Negative.   Allergic/Immunologic: Negative.   Neurological: Negative.        Tingling  Hematological: Bruises/bleeds easily.       Plavix  Psychiatric/Behavioral: Negative.   All other systems reviewed and are negative.  Objective:   Physical Exam Vitals and nursing note reviewed.  Constitutional:      Appearance: Normal appearance.  Cardiovascular:     Rate and Rhythm: Normal rate and regular rhythm.     Pulses: Normal pulses.     Heart sounds: Normal heart sounds.  Pulmonary:     Effort: Pulmonary effort is normal.     Breath sounds: Normal breath sounds.  Musculoskeletal:     Cervical back: Normal range of motion and neck supple.     Comments: Normal Muscle Bulk and Muscle Testing Reveals:  Upper Extremities: Full ROM and Muscle Strength 5/5 Lower Extremities: Right Lower Extremity: Full ROM and Muscle Strength 5/5 Left Lower Extremity: Decreased ROM and Muscle Strength 5/5 Left Lower Extremity Flexion Produces Pain into his Left Ankle Arises from Table with ease Narrow based  Gait   Skin:    General: Skin is warm and dry.  Neurological:     Mental Status: He is alert and oriented to person, place, and time.  Psychiatric:        Mood and Affect: Mood normal.        Behavior: Behavior normal.           Assessment & Plan:  1. Left Shoulder Pain: Continue HEP as Tolerated. Continue current medication regimen. Continue to monitor. 10/25/2020 2.Bilateral Greater Trochanter Bursitis: L>R. S/P Left Hip Injection with Dr Ranell Patrick, no relief noted he reports, Continue to alternate with Heat an Ice Therapy. Continue to monitor. 10/25/2020 3. Bilateral OA: Continue HEP as Tolerated. Continue current medication regimen. 10/25/2020 3. Chest Wall Discomfort/ Neuropathic Pain: No complaints today, Continue Gabapentin. Continue to Monitor. 10/25/2020 4. Chronic Pain Syndrome: : Refilled: Oxycodone 10/325 mg one tablet 4 times a day as needed for pain.We will continue  the opioid monitoring program, this consists of regular clinic visits, examinations, urine drug screen, pill counts as well as use of [place] Controlled Substance Reporting system. A 12 month History has been reviewed on the [place] Controlled Substance Reporting Systemon 10/25/2020.   F/U in 1 month

## 2020-11-06 ENCOUNTER — Other Ambulatory Visit (HOSPITAL_COMMUNITY): Payer: Self-pay | Admitting: Cardiology

## 2020-11-07 ENCOUNTER — Telehealth: Payer: Self-pay | Admitting: Physical Medicine and Rehabilitation

## 2020-11-07 NOTE — Telephone Encounter (Signed)
Increased opioid to 5x/day to assist with pain when he comes home from work.

## 2020-11-12 DIAGNOSIS — L509 Urticaria, unspecified: Secondary | ICD-10-CM | POA: Diagnosis not present

## 2020-11-16 ENCOUNTER — Encounter (HOSPITAL_COMMUNITY): Payer: Self-pay

## 2020-11-16 ENCOUNTER — Other Ambulatory Visit: Payer: Self-pay

## 2020-11-16 ENCOUNTER — Ambulatory Visit (HOSPITAL_COMMUNITY)
Admission: EM | Admit: 2020-11-16 | Discharge: 2020-11-16 | Disposition: A | Discharge status: Home / Self Care | Payer: BLUE CROSS/BLUE SHIELD

## 2020-11-16 DIAGNOSIS — R21 Rash and other nonspecific skin eruption: Secondary | ICD-10-CM

## 2020-11-16 MED ORDER — TRIAMCINOLONE ACETONIDE 0.025 % EX OINT: 1.0000 "application " | TOPICAL_OINTMENT | Freq: Two times a day (BID) | CUTANEOUS | 0 refills | Status: DC

## 2020-11-16 NOTE — ED Triage Notes (Signed)
Pt reports went to his doctor Tuesday for an itchy bumpy rash on arms, right ankle and back of neck. Was given steroid shot Tuesday and states was told was having some sort of allergic reaction.   Pt now reports that since Tuesday he started not feeling well. States he is now getting over being sick (was fatigued, nauseated/weak but now improving), and wonders if the rash is related to the illness.   Has used calamine lotion with no relief. Pt reports increased irritation of rash and that it has spread.

## 2020-11-16 NOTE — Discharge Instructions (Signed)
Look at information online about cleaning for bed bugs

## 2020-11-16 NOTE — ED Provider Notes (Signed)
[place]    CSN: [id number] Arrival date & time: 11/16/20  1746      History   Chief Complaint Chief Complaint  Patient presents with   Rash    HPI William Crawford is a 49 y.o. male.   HPI  Rash: Pt reports that after staying overnight at his friends house last week he developed itchy rash of his body. He was seen by his PCP who "gave him a steroid injection" which helped for a few days. He does not feel that he does not have any new lesions for the past few days. He asks for a work note for last week as he reports that he also did have some cold symptoms that have fully resolved.   Past Medical History:  Diagnosis Date   Alcohol abuse    Anxiety    CAD (coronary artery disease)    Cardiac arrest ([place])    Cardiogenic shock (HCC)    CHF (congestive heart failure) (HCC)    Cough    Dysrhythmia    GERD (gastroesophageal reflux disease)    HFrEF (heart failure with reduced ejection fraction) ([place])    Hypertension    Hypertriglyceridemia    Ischemic cardiomyopathy    Right atrial thrombus    Tobacco abuse     Patient Active Problem List   Diagnosis Date Noted   Sternal fracture 05/29/2020   Protruding sternal wires 05/08/2020   Near syncope    Syncope 03/18/2020   Neuropathic pain 02/07/2020   Chest wall discomfort 01/17/2020   Encounter for post surgical wound check 01/03/2020   Post-op pain 12/20/2019   Encounter for postoperative wound check 12/13/2019   Personal history of ECMO 11/22/2019   Postop check 11/17/2019   Encounter for therapeutic drug monitoring 11/14/2019   Atrial thrombus 11/14/2019   Endotracheally intubated    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[facility])    Status post cardiac surgery    Acute respiratory failure with hypoxia (HCC)    CAD (coronary artery disease) 10/25/2019   Angina pectoris ([place]) 10/25/2019   Myocardial infarction acute ([place]) 10/25/2019   Cardiogenic shock ([place]) 10/25/2019   Tobacco use  10/18/2019   Ischemic cardiomyopathy 10/18/2019   Hypertension 10/18/2019   Hyperlipemia 10/18/2019   Alcohol abuse 10/18/2019   NSTEMI (non-ST elevated myocardial infarction) ([place]) 10/17/2019    Past Surgical History:  Procedure Laterality Date   APPLICATION OF WOUND VAC  10/25/2019   Procedure: Application Of Wound Vac;  Surgeon: [name], MD;  Location: [place];  Service: Open Heart Surgery;;   CANNULATION FOR ECMO (EXTRACORPOREAL MEMBRANE OXYGENATION)  10/25/2019   Procedure: Cannulation For Ecmo (Extracorporeal Membrane Oxygenation) @ 1531;  Surgeon: [name], MD;  Location: [place];  Service: Open Heart Surgery;;   CANNULATION FOR ECMO (EXTRACORPOREAL MEMBRANE OXYGENATION) N/A 10/30/2019   Procedure: DECANNULATION OF ECMO (EXTRACORPOREAL MEMBRANE OXYGENATION);  Surgeon: Prescott Gum, Collier Salina, MD;  Location: [place];  Service: Open Heart Surgery;  Laterality: N/A;  Pump standby   CHEST EXPLORATION  10/25/2019   Procedure: Chest Exploration for repair of coronary perforation (repair of LAD);  Surgeon: Prescott Gum, Collier Salina, MD;  Location: [place];  Service: Open Heart Surgery;;   CORONARY CTO INTERVENTION N/A 10/25/2019   Procedure: CORONARY CTO INTERVENTION;  Surgeon: Martinique, Peter M, MD;  Location: [place] CV LAB;  Service: Cardiovascular;  Laterality: N/A;   CORONARY STENT INTERVENTION N/A 10/17/2019   Procedure: CORONARY STENT INTERVENTION;  Surgeon: Burt Knack,  Legrand Como, MD;  Location: [place] CV LAB;  Service: Cardiovascular;  Laterality: N/A;   LEFT HEART CATH AND CORONARY ANGIOGRAPHY N/A 10/17/2019   Procedure: LEFT HEART CATH AND CORONARY ANGIOGRAPHY;  Surgeon: Sherren Mocha, MD;  Location: [place] CV LAB;  Service: Cardiovascular;  Laterality: N/A;   [place]  10/25/2019   Procedure: Median Sternotomy.;  Surgeon: [name], MD;  Location: [facility] OR;  Service: Open Heart Surgery;;   PLACEMENT OF IMPELLA LEFT VENTRICULAR ASSIST DEVICE  10/30/2019   Procedure: Placement Of  Impella Left Ventricular Assist Device 5.5 USING 10MM HEMASHIELD PLATINUM GRAFT;  Surgeon: [name], MD;  Location: [place];  Service: Open Heart Surgery;;  graft placed into aorta   REMOVAL OF IMPELLA LEFT VENTRICULAR ASSIST DEVICE N/A 11/03/2019   Procedure: REMOVAL OF IMPELLA 5.5 LEFT VENTRICULAR ASSIST DEVICE;  Surgeon: [name], MD;  Location: [place];  Service: Open Heart Surgery;  Laterality: N/A;   STERNAL CLOSURE N/A 10/30/2019   Procedure: Sternal Closure;  Surgeon: [name], MD;  Location: [place];  Service: Open Heart Surgery;  Laterality: N/A;   STERNAL WIRES REMOVAL N/A 05/14/2020   Procedure: STERNAL WIRES REMOVAL;  Surgeon: [name], MD;  Location: [place];  Service: Thoracic;  Laterality: N/A;   TEE WITHOUT CARDIOVERSION N/A 10/25/2019   Procedure: TRANSESOPHAGEAL ECHOCARDIOGRAM (TEE);  Surgeon: Prescott Gum, Collier Salina, MD;  Location: [place];  Service: Open Heart Surgery;  Laterality: N/A;   TEE WITHOUT CARDIOVERSION N/A 10/30/2019   Procedure: TRANSESOPHAGEAL ECHOCARDIOGRAM (TEE);  Surgeon: Prescott Gum, Collier Salina, MD;  Location: [place];  Service: Open Heart Surgery;  Laterality: N/A;   TEE WITHOUT CARDIOVERSION N/A 11/03/2019   Procedure: TRANSESOPHAGEAL ECHOCARDIOGRAM (TEE);  Surgeon: Prescott Gum, Collier Salina, MD;  Location: [place];  Service: Open Heart Surgery;  Laterality: N/A;       Home Medications    Prior to Admission medications   Medication Sig Start Date End Date Taking? Authorizing Provider  Ascorbic Acid (VITAMIN C) 100 MG tablet Take 100 mg by mouth daily.   Yes [provider]  aspirin EC 81 MG tablet Take 1 tablet (81 mg total) by mouth daily. Swallow whole. 02/20/20  Yes Larey Dresser, MD  atorvastatin (LIPITOR) 40 MG tablet Take 1 tablet (40 mg total) by mouth daily. 06/12/20  Yes Larey Dresser, MD  carvedilol (COREG) 3.125 MG tablet TAKE 1 TABLET(3.125 MG) BY MOUTH TWICE DAILY WITH A MEAL 09/19/20  Yes Larey Dresser, MD  cetirizine (ZYRTEC) 10 MG  tablet Take 10 mg by mouth daily.   Yes [provider]  clopidogrel (PLAVIX) 75 MG tablet TAKE 1 TABLET(75 MG) BY MOUTH DAILY 11/06/20  Yes Larey Dresser, MD  fluticasone [facility]) 50 MCG/ACT nasal spray Place 2 sprays into both nostrils daily.   Yes [provider]  gabapentin (NEURONTIN) 600 MG tablet Take 1 tablet (600 mg total) by mouth 3 (three) times daily. 09/25/20  Yes Bayard Hugger, NP  icosapent Ethyl (VASCEPA) 1 g capsule Take 2 capsules (2 g total) by mouth 2 (two) times daily. 08/26/20  Yes Larey Dresser, MD  Misc Natural Products (OSTEO BI-FLEX ADV JOINT SHIELD) TABS Take 2 tablets by mouth.   Yes [provider]  oxyCODONE-acetaminophen (PERCOCET) 10-325 MG tablet Take 1 tablet by mouth 4 (four) times daily as needed for pain. 10/25/20  Yes Bayard Hugger, NP  POTASSIUM CHLORIDE ER PO Take by mouth daily.   Yes [provider]  sacubitril-valsartan (ENTRESTO) 49-51 MG Take 1 tablet by mouth 2 (two) times daily. 04/11/20  Yes Kathyrn Drown D, NP  thiamine 100 MG tablet Take 1 tablet (100 mg total) by mouth daily. 11/12/19  Yes Conte, Tessa N, PA-C  UNABLE TO FIND Med Name: reports taking supplement called Inno, two varieties (fat burning supplement and a digestive aid).   Yes [provider]  Turmeric 500 MG CAPS Take 500 mg by mouth daily.    [provider]    Family History Family History  Problem Relation Age of Onset   Heart attack Father    Heart disease Father    Heart attack Paternal Grandfather    Heart disease Paternal Grandfather    Heart attack Paternal Uncle    Heart disease Paternal Uncle    Heart attack Paternal Uncle    Heart disease Paternal Uncle     Social History Social History   Tobacco Use   Smoking status: Former    Pack years: 0.00    Types: Cigarettes    Quit date: 10/23/2019    Years since quitting: 1.0   Smokeless tobacco: Never  Vaping Use   Vaping Use: Never used  Substance Use  Topics   Alcohol use: Not Currently    Comment: QUIT  IN MAY 73220   Drug use: No     Allergies   Patient has no known allergies.   Review of Systems Review of Systems  As stated above in HPI Physical Exam Triage Vital Signs ED Triage Vitals  Enc Vitals Group     BP 11/16/20 1803 134/79     Pulse Rate 11/16/20 1803 66     Resp 11/16/20 1803 18     Temp 11/16/20 1803 98.5 F (36.9 C)     Temp src --      SpO2 11/16/20 1803 98 %     Weight --      Height --      Head Circumference --      Peak Flow --      Pain Score 11/16/20 1757 0     Pain Loc --      Pain Edu? --      Excl. in [place]? --    No data found.  Updated Vital Signs BP 134/79   Pulse 66   Temp 98.5 F (36.9 C)   Resp 18   SpO2 98%   Physical Exam Vitals and nursing note reviewed.  Constitutional:      Appearance: Normal appearance.  Cardiovascular:     Rate and Rhythm: Normal rate and regular rhythm.     Heart sounds: Normal heart sounds.  Pulmonary:     Effort: Pulmonary effort is normal.     Breath sounds: Normal breath sounds.  Skin:    Comments: See below   Neurological:     Mental Status: He is alert.              UC Treatments / Results  Labs (all labs ordered are listed, but only abnormal results are displayed) Labs Reviewed - No data to display  EKG   Radiology No results found.  Procedures Procedures (including critical care time)  Medications Ordered in UC Medications - No data to display  Initial Impression / Assessment and Plan / UC Course  I have reviewed the triage vital signs and the nursing notes.  Pertinent labs & imaging results that were available during my care of the patient  were reviewed by me and considered in my medical decision making (see chart for details).     New.  I discussed with patient that to me this appears to be bedbugs given its distribution in linear lines of three and the history.  We discussed how to clean for bedbugs and I am  going to send in a steroid cream for him to apply to help while the bite marks improve. Discussed red flag signs and symptoms.  Final Clinical Impressions(s) / UC Diagnoses   Final diagnoses:  Rash     Discharge Instructions      Look at information online about cleaning for bed bugs    ED Prescriptions   None    PDMP not reviewed this encounter.   Hughie Closs, [place] 11/16/20 1832

## 2020-11-22 ENCOUNTER — Encounter: Payer: BLUE CROSS/BLUE SHIELD | Attending: Physical Medicine and Rehabilitation | Admitting: Registered Nurse

## 2020-11-22 ENCOUNTER — Encounter: Payer: Self-pay | Admitting: Registered Nurse

## 2020-11-22 ENCOUNTER — Other Ambulatory Visit: Payer: Self-pay

## 2020-11-22 VITALS — BP 112/74 | HR 77 | Temp 98.9°F | Ht 68.0 in | Wt 191.0 lb

## 2020-11-22 DIAGNOSIS — M25512 Pain in left shoulder: Secondary | ICD-10-CM | POA: Diagnosis present

## 2020-11-22 DIAGNOSIS — Z5181 Encounter for therapeutic drug level monitoring: Secondary | ICD-10-CM | POA: Diagnosis present

## 2020-11-22 DIAGNOSIS — M170 Bilateral primary osteoarthritis of knee: Secondary | ICD-10-CM | POA: Insufficient documentation

## 2020-11-22 DIAGNOSIS — M7062 Trochanteric bursitis, left hip: Secondary | ICD-10-CM | POA: Diagnosis present

## 2020-11-22 DIAGNOSIS — G894 Chronic pain syndrome: Secondary | ICD-10-CM | POA: Insufficient documentation

## 2020-11-22 DIAGNOSIS — G8929 Other chronic pain: Secondary | ICD-10-CM | POA: Diagnosis present

## 2020-11-22 DIAGNOSIS — Z79899 Other long term (current) drug therapy: Secondary | ICD-10-CM | POA: Diagnosis present

## 2020-11-22 DIAGNOSIS — M545 Low back pain, unspecified: Secondary | ICD-10-CM | POA: Insufficient documentation

## 2020-11-22 MED ORDER — OXYCODONE-ACETAMINOPHEN 10-325 MG PO TABS: 1.0000 | ORAL_TABLET | Freq: Every day | ORAL | 0 refills | Status: DC | PRN

## 2020-11-22 NOTE — Progress Notes (Signed)
Subjective:    Patient ID: William Crawford, male    DOB: [birth year], 50 y.o.   MRN: [id number]  HPI: William Crawford is a 50 y.o. male who returns for follow up appointment for chronic pain and medication refill. He states his pain is located in his left shoulder, lower back pain, left hip and bilateral knee pain. He was in contact with Dr Ranell Patrick regarding his increase intensity of pain, per Dr Ranell Patrick note his oxycodone will be increased to 5 times a day as needed for pain. William Crawford gains understanding. He rates his pain 4. His current exercise regime is walking and performing stretching exercises.  William Crawford Morphine equivalent is 60.00  MME.   Last UDs was Performed on 08/28/2020, it was consistent.      Pain Inventory Average Pain 8 Pain Right Now 4 My pain is sharp, burning, stabbing, tingling, and aching  In the last 24 hours, has pain interfered with the following? General activity 8 Relation with others 2 Enjoyment of life 6 What TIME of day is your pain at its worst? morning , evening, and night Sleep (in general) Fair  Pain is worse with: walking, bending, standing, and some activites Pain improves with: rest and medication Relief from Meds: 8  Family History  Problem Relation Age of Onset   Heart attack Father    Heart disease Father    Heart attack Paternal Grandfather    Heart disease Paternal Grandfather    Heart attack Paternal Uncle    Heart disease Paternal Uncle    Heart attack Paternal Uncle    Heart disease Paternal Uncle    Social History   Socioeconomic History   Marital status: Single    Spouse name: Not on file   Number of children: Not on file   Years of education: Not on file   Highest education level: Not on file  Occupational History   Not on file  Tobacco Use   Smoking status: Former    Pack years: 0.00    Types: Cigarettes    Quit date: 10/23/2019    Years since quitting: 1.0   Smokeless tobacco: Never  Vaping Use    Vaping Use: Never used  Substance and Sexual Activity   Alcohol use: Not Currently    Comment: QUIT  IN MAY 13244   Drug use: No   Sexual activity: Not on file  Other Topics Concern   Not on file  Social History Narrative   Not on file   Social Determinants of Health   Financial Resource Strain: Not on file  Food Insecurity: Not on file  Transportation Needs: Not on file  Physical Activity: Not on file  Stress: Not on file  Social Connections: Not on file   Past Surgical History:  Procedure Laterality Date   APPLICATION OF WOUND VAC  10/25/2019   Procedure: Application Of Wound Vac;  Surgeon: [name], MD;  Location: [place];  Service: Open Heart Surgery;;   CANNULATION FOR ECMO (EXTRACORPOREAL MEMBRANE OXYGENATION)  10/25/2019   Procedure: Cannulation For Ecmo (Extracorporeal Membrane Oxygenation) @ 1531;  Surgeon: Prescott Gum, Collier Salina, MD;  Location: [place];  Service: Open Heart Surgery;;   CANNULATION FOR ECMO (EXTRACORPOREAL MEMBRANE OXYGENATION) N/A 10/30/2019   Procedure: DECANNULATION OF ECMO (EXTRACORPOREAL MEMBRANE OXYGENATION);  Surgeon: Prescott Gum, Collier Salina, MD;  Location: [place];  Service: Open Heart Surgery;  Laterality: N/A;  Pump standby   CHEST EXPLORATION  10/25/2019   Procedure: Chest  Exploration for repair of coronary perforation (repair of LAD);  Surgeon: Prescott Gum, Collier Salina, MD;  Location: [place];  Service: Open Heart Surgery;;   CORONARY CTO INTERVENTION N/A 10/25/2019   Procedure: CORONARY CTO INTERVENTION;  Surgeon: Martinique, Peter M, MD;  Location: [place] CV LAB;  Service: Cardiovascular;  Laterality: N/A;   CORONARY STENT INTERVENTION N/A 10/17/2019   Procedure: CORONARY STENT INTERVENTION;  Surgeon: Sherren Mocha, MD;  Location: [place] CV LAB;  Service: Cardiovascular;  Laterality: N/A;   LEFT HEART CATH AND CORONARY ANGIOGRAPHY N/A 10/17/2019   Procedure: LEFT HEART CATH AND CORONARY ANGIOGRAPHY;  Surgeon: Sherren Mocha, MD;  Location: [place] CV LAB;   Service: Cardiovascular;  Laterality: N/A;   [place]  10/25/2019   Procedure: Median Sternotomy.;  Surgeon: [name], MD;  Location: [facility] OR;  Service: Open Heart Surgery;;   PLACEMENT OF IMPELLA LEFT VENTRICULAR ASSIST DEVICE  10/30/2019   Procedure: Placement Of Impella Left Ventricular Assist Device 5.5 USING 10MM HEMASHIELD PLATINUM GRAFT;  Surgeon: [name], MD;  Location: [place];  Service: Open Heart Surgery;;  graft placed into aorta   REMOVAL OF IMPELLA LEFT VENTRICULAR ASSIST DEVICE N/A 11/03/2019   Procedure: REMOVAL OF IMPELLA 5.5 LEFT VENTRICULAR ASSIST DEVICE;  Surgeon: [name], MD;  Location: [place];  Service: Open Heart Surgery;  Laterality: N/A;   STERNAL CLOSURE N/A 10/30/2019   Procedure: Sternal Closure;  Surgeon: [name], MD;  Location: [place];  Service: Open Heart Surgery;  Laterality: N/A;   STERNAL WIRES REMOVAL N/A 05/14/2020   Procedure: STERNAL WIRES REMOVAL;  Surgeon: [name], MD;  Location: [place];  Service: Thoracic;  Laterality: N/A;   TEE WITHOUT CARDIOVERSION N/A 10/25/2019   Procedure: TRANSESOPHAGEAL ECHOCARDIOGRAM (TEE);  Surgeon: Prescott Gum, Collier Salina, MD;  Location: [place];  Service: Open Heart Surgery;  Laterality: N/A;   TEE WITHOUT CARDIOVERSION N/A 10/30/2019   Procedure: TRANSESOPHAGEAL ECHOCARDIOGRAM (TEE);  Surgeon: Prescott Gum, Collier Salina, MD;  Location: [place];  Service: Open Heart Surgery;  Laterality: N/A;   TEE WITHOUT CARDIOVERSION N/A 11/03/2019   Procedure: TRANSESOPHAGEAL ECHOCARDIOGRAM (TEE);  Surgeon: Prescott Gum, Collier Salina, MD;  Location: [place];  Service: Open Heart Surgery;  Laterality: N/A;   Past Surgical History:  Procedure Laterality Date   APPLICATION OF WOUND VAC  10/25/2019   Procedure: Application Of Wound Vac;  Surgeon: [name], MD;  Location: [place];  Service: Open Heart Surgery;;   CANNULATION FOR ECMO (EXTRACORPOREAL MEMBRANE OXYGENATION)  10/25/2019   Procedure: Cannulation For Ecmo (Extracorporeal Membrane  Oxygenation) @ 7824;  Surgeon: [name], MD;  Location: [place];  Service: Open Heart Surgery;;   CANNULATION FOR ECMO (EXTRACORPOREAL MEMBRANE OXYGENATION) N/A 10/30/2019   Procedure: DECANNULATION OF ECMO (EXTRACORPOREAL MEMBRANE OXYGENATION);  Surgeon: Prescott Gum, Collier Salina, MD;  Location: [place];  Service: Open Heart Surgery;  Laterality: N/A;  Pump standby   CHEST EXPLORATION  10/25/2019   Procedure: Chest Exploration for repair of coronary perforation (repair of LAD);  Surgeon: Prescott Gum, Collier Salina, MD;  Location: [place];  Service: Open Heart Surgery;;   CORONARY CTO INTERVENTION N/A 10/25/2019   Procedure: CORONARY CTO INTERVENTION;  Surgeon: Martinique, Peter M, MD;  Location: [place] CV LAB;  Service: Cardiovascular;  Laterality: N/A;   CORONARY STENT INTERVENTION N/A 10/17/2019   Procedure: CORONARY STENT INTERVENTION;  Surgeon: Sherren Mocha, MD;  Location: [place] CV LAB;  Service: Cardiovascular;  Laterality: N/A;   LEFT HEART  CATH AND CORONARY ANGIOGRAPHY N/A 10/17/2019   Procedure: LEFT HEART CATH AND CORONARY ANGIOGRAPHY;  Surgeon: Sherren Mocha, MD;  Location: [place] CV LAB;  Service: Cardiovascular;  Laterality: N/A;   [place]  10/25/2019   Procedure: Median Sternotomy.;  Surgeon: [name], MD;  Location: [facility] OR;  Service: Open Heart Surgery;;   PLACEMENT OF IMPELLA LEFT VENTRICULAR ASSIST DEVICE  10/30/2019   Procedure: Placement Of Impella Left Ventricular Assist Device 5.5 USING 10MM HEMASHIELD PLATINUM GRAFT;  Surgeon: [name], MD;  Location: [place];  Service: Open Heart Surgery;;  graft placed into aorta   REMOVAL OF IMPELLA LEFT VENTRICULAR ASSIST DEVICE N/A 11/03/2019   Procedure: REMOVAL OF IMPELLA 5.5 LEFT VENTRICULAR ASSIST DEVICE;  Surgeon: [name], MD;  Location: [place];  Service: Open Heart Surgery;  Laterality: N/A;   STERNAL CLOSURE N/A 10/30/2019   Procedure: Sternal Closure;  Surgeon: [name], MD;  Location: [place];  Service: Open  Heart Surgery;  Laterality: N/A;   STERNAL WIRES REMOVAL N/A 05/14/2020   Procedure: STERNAL WIRES REMOVAL;  Surgeon: [name], MD;  Location: [place];  Service: Thoracic;  Laterality: N/A;   TEE WITHOUT CARDIOVERSION N/A 10/25/2019   Procedure: TRANSESOPHAGEAL ECHOCARDIOGRAM (TEE);  Surgeon: Prescott Gum, Collier Salina, MD;  Location: [place];  Service: Open Heart Surgery;  Laterality: N/A;   TEE WITHOUT CARDIOVERSION N/A 10/30/2019   Procedure: TRANSESOPHAGEAL ECHOCARDIOGRAM (TEE);  Surgeon: Prescott Gum, Collier Salina, MD;  Location: [place];  Service: Open Heart Surgery;  Laterality: N/A;   TEE WITHOUT CARDIOVERSION N/A 11/03/2019   Procedure: TRANSESOPHAGEAL ECHOCARDIOGRAM (TEE);  Surgeon: Prescott Gum, Collier Salina, MD;  Location: [place];  Service: Open Heart Surgery;  Laterality: N/A;   Past Medical History:  Diagnosis Date   Alcohol abuse    Anxiety    CAD (coronary artery disease)    Cardiac arrest ([place])    Cardiogenic shock (HCC)    CHF (congestive heart failure) (HCC)    Cough    Dysrhythmia    GERD (gastroesophageal reflux disease)    HFrEF (heart failure with reduced ejection fraction) (HCC)    Hypertension    Hypertriglyceridemia    Ischemic cardiomyopathy    Right atrial thrombus    Tobacco abuse    BP 112/74   Pulse 77   Temp 98.9 F (37.2 C)   Ht 5' 8"  (1.727 m)   Wt 191 lb (86.6 kg)   SpO2 96%   BMI 29.04 kg/m   Opioid Risk Score:   Fall Risk Score:  `1  Depression screen PHQ 2/9  Depression screen [facility] 2/9 11/22/2020 10/25/2020 09/25/2020 09/03/2020 08/28/2020 06/28/2020 05/10/2020  Decreased Interest 0 0 0 0 0 0 0  Down, Depressed, Hopeless 0 0 0 0 0 0 0  PHQ - 2 Score 0 0 0 0 0 0 0  Altered sleeping - - - - - - 1  Tired, decreased energy - - - - - - 1  Change in appetite - - - - - - 0  Feeling bad or failure about yourself  - - - - - - 0  Trouble concentrating - - - - - - 0  Moving slowly or fidgety/restless - - - - - - 0  Suicidal thoughts - - - - - - 0  PHQ-9 Score - - - - - - 2   Difficult doing work/chores - - - - - - Somewhat difficult     Review of Systems  Constitutional: Negative.   HENT: Negative.    Eyes: Negative.   Respiratory: Negative.    Cardiovascular: Negative.   Gastrointestinal: Negative.   Endocrine: Negative.   Genitourinary: Negative.   Musculoskeletal:  Positive for arthralgias, back pain and myalgias.  Skin: Negative.   Allergic/Immunologic: Negative.   Neurological:        Tingling  Hematological:  Bruises/bleeds easily.       Plavix  Psychiatric/Behavioral: Negative.    All other systems reviewed and are negative.     Objective:   Physical Exam Vitals and nursing note reviewed.  Constitutional:      Appearance: Normal appearance.  Cardiovascular:     Rate and Rhythm: Normal rate and regular rhythm.     Pulses: Normal pulses.     Heart sounds: Normal heart sounds.  Pulmonary:     Effort: Pulmonary effort is normal.     Breath sounds: Normal breath sounds.  Musculoskeletal:     Cervical back: Normal range of motion and neck supple.     Comments: Normal Muscle Bulk and Muscle Testing Reveals:  Upper Extremities: Full ROM and Muscle Strength  5/5 Lumbar Paraspinal Tenderness: L-4-L-5 Lower Extremities : Full ROM and Muscle Strength 5/5 Arises from Table with ease Narrow Based Gait     Skin:    General: Skin is warm and dry.  Neurological:     Mental Status: He is alert and oriented to person, place, and time.  Psychiatric:        Mood and Affect: Mood normal.        Behavior: Behavior normal.         Assessment & Plan:  1. Left Shoulder Pain: Continue HEP as Tolerated. Continue current medication regimen. Continue to monitor. 11/22/2020 2.Left Greater Trochanter Bursitis: S/P Left Hip Injection with Dr Ranell Patrick on 09/03/2020, no relief noted he reports, Continue to alternate with Heat an Ice Therapy. Continue to monitor. 11/22/2020 3. Bilateral  Knees OA: Continue HEP as Tolerated. Continue current medication  regimen. 11/22/2020 3. Chest Wall Discomfort/ Neuropathic Pain: No complaints today, Continue Gabapentin. Continue to Monitor. 11/22/2020 4. Chronic Pain Syndrome: : Refilled: Increased: Per Dr Ranell Patrick Note: Oxycodone 10/325 mg one tablet 5 times a day as needed for pain #150. We will continue the opioid monitoring program, this consists of regular clinic visits, examinations, urine drug screen, pill counts as well as use of [place] Controlled Substance Reporting system. A 12 month History has been reviewed on the [place] Controlled Substance Reporting System on 11/22/2020.   F/U in 1 month

## 2020-12-16 ENCOUNTER — Telehealth (HOSPITAL_COMMUNITY): Payer: Self-pay | Admitting: *Deleted

## 2020-12-16 NOTE — Telephone Encounter (Signed)
Pt called stating he tested positive for covid last Tuesday. Pt said a few days before that he was lifting something heavy and he had a sharp pain in the middle of his chest and then he felt like he was going to pass out. Pt said currently he is not experiencing chest pain unless he takes a deep breath in his chest will hurt. Pt asked that I forward this message to Dr.McLean

## 2020-12-16 NOTE — Telephone Encounter (Signed)
At this point, probably too late for him to take Paxlovid.  Let us know if the pleuritic chest pain does not improve.

## 2020-12-17 ENCOUNTER — Other Ambulatory Visit: Payer: Self-pay

## 2020-12-17 MED ORDER — OXYCODONE-ACETAMINOPHEN 10-325 MG PO TABS: 1.0000 | ORAL_TABLET | Freq: Every day | ORAL | 0 refills | Status: DC | PRN

## 2020-12-17 NOTE — Telephone Encounter (Signed)
Pt aware and agreeable with plan.  

## 2020-12-17 NOTE — Telephone Encounter (Signed)
PMP was Reviewed: Oxycodone e-scribed today.  Placed a call to William Crawford, he is awre of the above.

## 2020-12-27 ENCOUNTER — Other Ambulatory Visit: Payer: Self-pay

## 2020-12-27 ENCOUNTER — Encounter
Payer: BLUE CROSS/BLUE SHIELD | Attending: Physical Medicine and Rehabilitation | Admitting: Physical Medicine and Rehabilitation

## 2020-12-27 VITALS — BP 102/70 | HR 78 | Temp 98.0°F | Ht 68.0 in | Wt 186.6 lb

## 2020-12-27 DIAGNOSIS — Z79891 Long term (current) use of opiate analgesic: Secondary | ICD-10-CM | POA: Insufficient documentation

## 2020-12-27 DIAGNOSIS — Z5181 Encounter for therapeutic drug level monitoring: Secondary | ICD-10-CM | POA: Diagnosis present

## 2020-12-27 DIAGNOSIS — G894 Chronic pain syndrome: Secondary | ICD-10-CM | POA: Diagnosis present

## 2020-12-27 MED ORDER — OXYCODONE-ACETAMINOPHEN 10-325 MG PO TABS
1.0000 | ORAL_TABLET | Freq: Every day | ORAL | 0 refills | Status: DC | PRN
Start: 2020-12-27 — End: 2021-01-23

## 2020-12-27 MED ORDER — LIDOCAINE 5 % EX PTCH: 1.0000 | MEDICATED_PATCH | CUTANEOUS | 1 refills | Status: DC

## 2020-12-27 NOTE — Progress Notes (Signed)
Subjective:    Patient ID: William Crawford, male    DOB: Feb 02, 1971, 50 y.o.   MRN: [id number]  HPI: William Crawford is a 50 y.o. male who returns for follow up appointment for chronic pain and medication refill. He states his pain is located in his left shoulder, lower back pain, left hip and bilateral knee pain. He was in contact with Dr Ranell Patrick regarding his increase intensity of pain, per Dr Ranell Patrick note his oxycodone will be increased to 5 times a day as needed for pain. William Crawford understanding. He rates his pain 4. His current exercise regime is walking and performing stretching exercises.  William Crawford Morphine equivalent is 60.00  MME.   Last UDs was Performed on 08/28/2020, it was consistent.   He is having a good summer.   He fell down and hit his right ankle and got a big bruise- not hurting too bad.   He had a good time with his grandson riding rolling coasters at [employer]  His chest pain returned and it feels sore and he plans to f/u with his surgeon regarding the pain. It feels sharp, hot, like a burning He takes Gabapentin 618m- but that doesn't help unless used with the oxycodone.      Pain Inventory Average Pain 8 Pain Right Now 7 My pain is sharp, burning, stabbing, tingling, and aching  In the last 24 hours, has pain interfered with the following? General activity 10 Relation with others 10 Enjoyment of life 10 What TIME of day is your pain at its worst? morning  and daytime Sleep (in general) Fair  Pain is worse with: walking and bending Pain improves with: rest and medication Relief from Meds: 9  Family History  Problem Relation Age of Onset   Heart attack Father    Heart disease Father    Heart attack Paternal Grandfather    Heart disease Paternal Grandfather    Heart attack Paternal Uncle    Heart disease Paternal Uncle    Heart attack Paternal Uncle    Heart disease Paternal Uncle    Social History   Socioeconomic History    Marital status: Single    Spouse name: Not on file   Number of children: Not on file   Years of education: Not on file   Highest education level: Not on file  Occupational History   Not on file  Tobacco Use   Smoking status: Former    Types: Cigarettes    Quit date: 10/23/2019    Years since quitting: 1.1   Smokeless tobacco: Never  Vaping Use   Vaping Use: Never used  Substance and Sexual Activity   Alcohol use: Not Currently    Comment: QUIT  IN MAY [id number]  Drug use: No   Sexual activity: Not on file  Other Topics Concern   Not on file  Social History Narrative   Not on file   Social Determinants of Health   Financial Resource Strain: Not on file  Food Insecurity: Not on file  Transportation Needs: Not on file  Physical Activity: Not on file  Stress: Not on file  Social Connections: Not on file   Past Surgical History:  Procedure Laterality Date   APPLICATION OF WOUND VAC  10/25/2019   Procedure: Application Of Wound Vac;  Surgeon: VIvin Poot MD;  [place]  Service: Open Heart Surgery;;   CANNULATION FOR ECMO (EXTRACORPOREAL MEMBRANE OXYGENATION)  10/25/2019   Procedure:  Cannulation For Ecmo (Extracorporeal Membrane Oxygenation) @ 1531;  Surgeon: Prescott Gum, Collier Salina, MD;  Location: [place];  Service: Open Heart Surgery;;   CANNULATION FOR ECMO (EXTRACORPOREAL MEMBRANE OXYGENATION) N/A 10/30/2019   Procedure: DECANNULATION OF ECMO (EXTRACORPOREAL MEMBRANE OXYGENATION);  Surgeon: Prescott Gum, Collier Salina, MD;  Location: [place];  Service: Open Heart Surgery;  Laterality: N/A;  Pump standby   CHEST EXPLORATION  10/25/2019   Procedure: Chest Exploration for repair of coronary perforation (repair of LAD);  Surgeon: Prescott Gum, Collier Salina, MD;  Location: [place];  Service: Open Heart Surgery;;   CORONARY CTO INTERVENTION N/A 10/25/2019   Procedure: CORONARY CTO INTERVENTION;  Surgeon: Martinique, Peter M, MD;  Location: [place] [facility];  Service: Cardiovascular;  Laterality: N/A;   CORONARY  STENT INTERVENTION N/A 10/17/2019   Procedure: CORONARY STENT INTERVENTION;  Surgeon: Sherren Mocha, MD;  Location: [place] [facility];  Service: Cardiovascular;  Laterality: N/A;   LEFT HEART CATH AND CORONARY ANGIOGRAPHY N/A 10/17/2019   Procedure: LEFT HEART CATH AND CORONARY ANGIOGRAPHY;  Surgeon: Sherren Mocha, MD;  Location: [place] [facility];  Service: Cardiovascular;  Laterality: N/A;   [place]  10/25/2019   Procedure: Median Sternotomy.;  Surgeon: [name], MD;  Location: [facility];  Service: Open Heart Surgery;;   PLACEMENT OF IMPELLA LEFT VENTRICULAR ASSIST DEVICE  10/30/2019   Procedure: Placement Of Impella Left Ventricular Assist Device 5.5 USING 10MM HEMASHIELD PLATINUM GRAFT;  Surgeon: [name], MD;  Location: [place];  Service: Open Heart Surgery;;  graft placed into aorta   REMOVAL OF IMPELLA LEFT VENTRICULAR ASSIST DEVICE N/A 11/03/2019   Procedure: REMOVAL OF IMPELLA 5.5 LEFT VENTRICULAR ASSIST DEVICE;  Surgeon: [name], MD;  Location: [place];  Service: Open Heart Surgery;  Laterality: N/A;   STERNAL CLOSURE N/A 10/30/2019   Procedure: Sternal Closure;  Surgeon: [name], MD;  Location: [place];  Service: Open Heart Surgery;  Laterality: N/A;   STERNAL WIRES REMOVAL N/A 05/14/2020   Procedure: STERNAL WIRES REMOVAL;  Surgeon: [name], MD;  Location: [place];  Service: Thoracic;  Laterality: N/A;   TEE WITHOUT CARDIOVERSION N/A 10/25/2019   Procedure: TRANSESOPHAGEAL ECHOCARDIOGRAM (TEE);  Surgeon: Prescott Gum, Collier Salina, MD;  Location: [place];  Service: Open Heart Surgery;  Laterality: N/A;   TEE WITHOUT CARDIOVERSION N/A 10/30/2019   Procedure: TRANSESOPHAGEAL ECHOCARDIOGRAM (TEE);  Surgeon: Prescott Gum, Collier Salina, MD;  Location: [place];  Service: Open Heart Surgery;  Laterality: N/A;   TEE WITHOUT CARDIOVERSION N/A 11/03/2019   Procedure: TRANSESOPHAGEAL ECHOCARDIOGRAM (TEE);  Surgeon: Prescott Gum, Collier Salina, MD;  Location: [place];  Service: Open Heart Surgery;   Laterality: N/A;   Past Surgical History:  Procedure Laterality Date   APPLICATION OF WOUND VAC  10/25/2019   Procedure: Application Of Wound Vac;  Surgeon: [name], MD;  Location: [place];  Service: Open Heart Surgery;;   CANNULATION FOR ECMO (EXTRACORPOREAL MEMBRANE OXYGENATION)  10/25/2019   Procedure: Cannulation For Ecmo (Extracorporeal Membrane Oxygenation) @ 8676;  Surgeon: [name], MD;  Location: [place];  Service: Open Heart Surgery;;   CANNULATION FOR ECMO (EXTRACORPOREAL MEMBRANE OXYGENATION) N/A 10/30/2019   Procedure: DECANNULATION OF ECMO (EXTRACORPOREAL MEMBRANE OXYGENATION);  Surgeon: Prescott Gum, Collier Salina, MD;  Location: [place];  Service: Open Heart Surgery;  Laterality: N/A;  Pump standby   CHEST EXPLORATION  10/25/2019   Procedure: Chest Exploration for repair of coronary perforation (repair of LAD);  Surgeon: Prescott Gum, Collier Salina, MD;  [facility];  Service: Open Heart Surgery;;   CORONARY CTO INTERVENTION N/A 10/25/2019   Procedure: CORONARY CTO INTERVENTION;  Surgeon: Martinique, Peter M, MD;  Location: [place] [facility];  Service: Cardiovascular;  Laterality: N/A;   CORONARY STENT INTERVENTION N/A 10/17/2019   Procedure: CORONARY STENT INTERVENTION;  Surgeon: Sherren Mocha, MD;  Location: [place] [facility];  Service: Cardiovascular;  Laterality: N/A;   LEFT HEART CATH AND CORONARY ANGIOGRAPHY N/A 10/17/2019   Procedure: LEFT HEART CATH AND CORONARY ANGIOGRAPHY;  Surgeon: Sherren Mocha, MD;  Location: [place] [facility];  Service: Cardiovascular;  Laterality: N/A;   [place]  10/25/2019   Procedure: Median Sternotomy.;  Surgeon: [name], MD;  Location: [facility] OR;  Service: Open Heart Surgery;;   PLACEMENT OF IMPELLA LEFT VENTRICULAR ASSIST DEVICE  10/30/2019   Procedure: Placement Of Impella Left Ventricular Assist Device 5.5 USING 10MM HEMASHIELD PLATINUM GRAFT;  Surgeon: [name], MD;  Location: [place];  Service: Open Heart Surgery;;  graft placed into aorta    REMOVAL OF IMPELLA LEFT VENTRICULAR ASSIST DEVICE N/A 11/03/2019   Procedure: REMOVAL OF IMPELLA 5.5 LEFT VENTRICULAR ASSIST DEVICE;  Surgeon: [name], MD;  Location: [place];  Service: Open Heart Surgery;  Laterality: N/A;   STERNAL CLOSURE N/A 10/30/2019   Procedure: Sternal Closure;  Surgeon: [name], MD;  Location: [place];  Service: Open Heart Surgery;  Laterality: N/A;   STERNAL WIRES REMOVAL N/A 05/14/2020   Procedure: STERNAL WIRES REMOVAL;  Surgeon: [name], MD;  Location: [place];  Service: Thoracic;  Laterality: N/A;   TEE WITHOUT CARDIOVERSION N/A 10/25/2019   Procedure: TRANSESOPHAGEAL ECHOCARDIOGRAM (TEE);  Surgeon: Prescott Gum, Collier Salina, MD;  Location: [place];  Service: Open Heart Surgery;  Laterality: N/A;   TEE WITHOUT CARDIOVERSION N/A 10/30/2019   Procedure: TRANSESOPHAGEAL ECHOCARDIOGRAM (TEE);  Surgeon: Prescott Gum, Collier Salina, MD;  Location: [place];  Service: Open Heart Surgery;  Laterality: N/A;   TEE WITHOUT CARDIOVERSION N/A 11/03/2019   Procedure: TRANSESOPHAGEAL ECHOCARDIOGRAM (TEE);  Surgeon: Prescott Gum, Collier Salina, MD;  Location: [place];  Service: Open Heart Surgery;  Laterality: N/A;   Past Medical History:  Diagnosis Date   Alcohol abuse    Anxiety    CAD (coronary artery disease)    Cardiac arrest ([place])    Cardiogenic shock (HCC)    CHF (congestive heart failure) (HCC)    Cough    Dysrhythmia    GERD (gastroesophageal reflux disease)    HFrEF (heart failure with reduced ejection fraction) (HCC)    Hypertension    Hypertriglyceridemia    Ischemic cardiomyopathy    Right atrial thrombus    Tobacco abuse    There were no vitals taken for this visit.  Opioid Risk Score:   Fall Risk Score:  `1  Depression screen PHQ 2/9  Depression screen [facility] 2/9 11/22/2020 10/25/2020 09/25/2020 09/03/2020 08/28/2020 06/28/2020 05/10/2020  Decreased Interest 0 0 0 0 0 0 0  Down, Depressed, Hopeless 0 0 0 0 0 0 0  PHQ - 2 Score 0 0 0 0 0 0 0  Altered sleeping - - - - - - 1  Tired,  decreased energy - - - - - - 1  Change in appetite - - - - - - 0  Feeling bad or failure about yourself  - - - - - - 0  Trouble concentrating - - - - - - 0  Moving slowly or fidgety/restless - - - - - -  0  Suicidal thoughts - - - - - - 0  PHQ-9 Score - - - - - - 2  Difficult doing work/chores - - - - - - Somewhat difficult     Review of Systems  Constitutional: Negative.   HENT: Negative.    Eyes: Negative.   Respiratory: Negative.    Cardiovascular: Negative.   Gastrointestinal: Negative.   Endocrine: Negative.   Genitourinary: Negative.   Musculoskeletal:  Positive for arthralgias, back pain and myalgias.  Skin: Negative.   Allergic/Immunologic: Negative.   Neurological:        Tingling  Hematological:  Bruises/bleeds easily.       Plavix  Psychiatric/Behavioral: Negative.    All other systems reviewed and are negative.     Objective:          Assessment & Plan:  1. Left Shoulder Pain: Continue HEP as Tolerated. Continue current medication regimen. Continue to monitor. 11/22/2020 2.Left Greater Trochanter Bursitis: S/P Left Hip Injection with Dr Ranell Patrick on 09/03/2020, no relief noted he reports, Continue to alternate with Heat an Ice Therapy. Continue to monitor. 11/22/2020 3. Bilateral  Knees OA: Continue HEP as Tolerated. Continue current medication regimen. 11/22/2020 3. Chest Wall Discomfort/ Neuropathic Pain: Continue Gabapentin. Continue to Monitor. 11/22/2020. Prescribed lidocaine patch.  -Discussed Qutenza as an option for neuropathic pain control. Discussed that this is a capsaicin patch, stronger than capsaicin cream. Discussed that it is currently approved for diabetic peripheral neuropathy and post-herpetic neuralgia, but that it has also shown benefit in treating other forms of neuropathy. Provided patient with link to site to learn more about the patch: [employer]. Discussed that the patch would be placed in office and benefits usually last 3  months. Discussed that unintended exposure to capsaicin can cause severe irritation of eyes, mucous membranes, respiratory tract, and skin, but that Qutenza is a local treatment and does not have the systemic side effects of other nerve medications. Discussed that there may be pain, itching, erythema, and decreased sensory function associated with the application of Qutenza. Side effects usually subside within 1 week. A cold pack of analgesic medications can help with these side effects. Blood pressure can also be increased due to pain associated with administration of the patch.  4. Chronic Pain Syndrome: : Refilled: Increased: Per Dr Ranell Patrick Note: Continue Oxycodone 10/325 mg one tablet 5 times a day as needed for pain #150. We will continue the opioid monitoring program, this consists of regular clinic visits, examinations, urine drug screen, pill counts as well as use of [place] Controlled Substance Reporting system. A 12 month History has been reviewed on the [place] on 11/22/2020. -Discussed following foods that may reduce pain: 1) Ginger (especially studied for arthritis)- reduce leukotriene production to decrease inflammation 2) Blueberries- high in phytonutrients that decrease inflammation 3) Salmon- marine omega-3s reduce joint swelling and pain 4) Pumpkin seeds- reduce inflammation 5) dark chocolate- reduces inflammation 6) turmeric- reduces inflammation 7) tart cherries - reduce pain and stiffness 8) extra virgin olive oil - its compound olecanthal helps to block prostaglandins  9) chili peppers- can be eaten or applied topically via capsaicin 10) mint- helpful for headache, muscle aches, joint pain, and itching 11) garlic- reduces inflammation  Link to further information on diet for chronic pain: http://www.randall.com/    F/u with Zella Ball 1 month next 3 months, 4  months for Qutenza

## 2020-12-27 NOTE — Patient Instructions (Addendum)
Foods that may reduce pain: 1) Ginger (especially studied for arthritis)- reduce leukotriene production to decrease inflammation 2) Blueberries- high in phytonutrients that decrease inflammation 3) Salmon- marine omega-3s reduce joint swelling and pain 4) Pumpkin seeds- reduce inflammation 5) dark chocolate- reduces inflammation 6) turmeric- reduces inflammation 7) tart cherries - reduce pain and stiffness 8) extra virgin olive oil - its compound olecanthal helps to block prostaglandins  9) chili peppers- can be eaten or applied topically via capsaicin 10) mint- helpful for headache, muscle aches, joint pain, and itching 11) garlic- reduces inflammation  Link to further information on diet for chronic pain: http://www.randall.com/   Obesity: -Consider [place] tea daily.  -Discussed the benefits of intermittent fasting. -Discussed foods that can assist in weight loss: 1) leafy greens- high in fiber and nutrients 2) dark chocolate- improves metabolism (if prefer sweetened, best to sweeten with honey instead of sugar).  3) cruciferous vegetables- high in fiber and protein 4) full fat yogurt: high in healthy fat, protein, calcium, and probiotics 5) apples- high in a variety of phytochemicals 6) nuts- high in fiber and protein that increase feelings of fullness 7) grapefruit: rich in nutrients, antioxidants, and fiber (not to be taken with anticoagulation) 8) beans- high in protein and fiber 9) salmon- has high quality protein and healthy fats 10) green tea- rich in polyphenols 11) eggs- rich in choline and vitamin D 12) tuna- high protein, boosts metabolism 13) avocado- decreases visceral abdominal fat 14) chicken (pasture raised): high in protein and iron 15) blueberries- reduce abdominal fat and cholesterol 16) whole grains- decreases calories retained during digestion, speeds metabolism 17) chia seeds- curb  appetite 18) chilies- increases fat metabolism

## 2020-12-27 NOTE — Addendum Note (Signed)
Addended by: [name] on: 12/27/2020 03:11 PM   Modules accepted: Orders

## 2021-01-01 LAB — TOXASSURE SELECT,+ANTIDEPR,UR

## 2021-01-03 ENCOUNTER — Telehealth: Payer: Self-pay | Admitting: *Deleted

## 2021-01-03 NOTE — Telephone Encounter (Signed)
Urine drug screen for this encounter is consistent for prescribed medication 

## 2021-01-14 ENCOUNTER — Other Ambulatory Visit (HOSPITAL_COMMUNITY): Payer: Self-pay | Admitting: Cardiology

## 2021-01-16 ENCOUNTER — Other Ambulatory Visit (HOSPITAL_COMMUNITY): Payer: Self-pay

## 2021-01-18 ENCOUNTER — Other Ambulatory Visit (HOSPITAL_COMMUNITY): Payer: Self-pay | Admitting: Cardiology

## 2021-01-21 ENCOUNTER — Encounter: Payer: Self-pay | Admitting: Physical Medicine and Rehabilitation

## 2021-01-22 ENCOUNTER — Other Ambulatory Visit: Payer: Self-pay | Admitting: Cardiology

## 2021-01-22 MED ORDER — ATORVASTATIN CALCIUM 40 MG PO TABS: 40.0000 mg | ORAL_TABLET | Freq: Every day | ORAL | 0 refills | Status: DC

## 2021-01-23 ENCOUNTER — Encounter: Payer: BLUE CROSS/BLUE SHIELD | Attending: Physical Medicine and Rehabilitation | Admitting: Registered Nurse

## 2021-01-23 ENCOUNTER — Other Ambulatory Visit: Payer: Self-pay

## 2021-01-23 ENCOUNTER — Telehealth: Payer: Self-pay

## 2021-01-23 VITALS — BP 119/78 | HR 78 | Temp 98.7°F | Ht 68.0 in | Wt 188.2 lb

## 2021-01-23 DIAGNOSIS — M5412 Radiculopathy, cervical region: Secondary | ICD-10-CM | POA: Diagnosis present

## 2021-01-23 DIAGNOSIS — Z79899 Other long term (current) drug therapy: Secondary | ICD-10-CM | POA: Diagnosis present

## 2021-01-23 DIAGNOSIS — M25512 Pain in left shoulder: Secondary | ICD-10-CM | POA: Diagnosis present

## 2021-01-23 DIAGNOSIS — M792 Neuralgia and neuritis, unspecified: Secondary | ICD-10-CM | POA: Insufficient documentation

## 2021-01-23 DIAGNOSIS — G8929 Other chronic pain: Secondary | ICD-10-CM | POA: Insufficient documentation

## 2021-01-23 DIAGNOSIS — G894 Chronic pain syndrome: Secondary | ICD-10-CM | POA: Insufficient documentation

## 2021-01-23 DIAGNOSIS — M545 Low back pain, unspecified: Secondary | ICD-10-CM | POA: Insufficient documentation

## 2021-01-23 DIAGNOSIS — Z5181 Encounter for therapeutic drug level monitoring: Secondary | ICD-10-CM | POA: Diagnosis present

## 2021-01-23 DIAGNOSIS — G4700 Insomnia, unspecified: Secondary | ICD-10-CM | POA: Insufficient documentation

## 2021-01-23 DIAGNOSIS — M25511 Pain in right shoulder: Secondary | ICD-10-CM | POA: Diagnosis not present

## 2021-01-23 DIAGNOSIS — M170 Bilateral primary osteoarthritis of knee: Secondary | ICD-10-CM | POA: Insufficient documentation

## 2021-01-23 MED ORDER — OXYCODONE-ACETAMINOPHEN 10-325 MG PO TABS: 1.0000 | ORAL_TABLET | Freq: Every day | ORAL | 0 refills | Status: DC | PRN

## 2021-01-23 NOTE — Progress Notes (Signed)
Subjective:    Patient ID: William Crawford, male    DOB: 1971-03-29, 50 y.o.   MRN: [id number]  HPI: William Crawford is a 50 y.o. male who returns for follow up appointment for chronic pain and medication refill. He states his pain is located in his bilateral shoulders, lower back pain with activity and bilateral knee pain. He rates his pain 6. His current exercise regime is walking and performing stretching exercises.  Mr. Velardi Morphine equivalent is 75.00 MME.  Last UDS was Performed on 12/27/2020, it was consistent.     Pain Inventory Average Pain 8 Pain Right Now 6 My pain is sharp, burning, and stabbing  In the last 24 hours, has pain interfered with the following? General activity 10 Relation with others 5 Enjoyment of life 5 What TIME of day is your pain at its worst? morning , daytime, and evening Sleep (in general) Fair  Pain is worse with: walking, bending, and some activites Pain improves with: medication Relief from Meds: 8  Family History  Problem Relation Age of Onset   Heart attack Father    Heart disease Father    Heart attack Paternal Grandfather    Heart disease Paternal Grandfather    Heart attack Paternal Uncle    Heart disease Paternal Uncle    Heart attack Paternal Uncle    Heart disease Paternal Uncle    Social History   Socioeconomic History   Marital status: Single    Spouse name: Not on file   Number of children: Not on file   Years of education: Not on file   Highest education level: Not on file  Occupational History   Not on file  Tobacco Use   Smoking status: Former    Types: Cigarettes    Quit date: 10/23/2019    Years since quitting: 1.2   Smokeless tobacco: Never  Vaping Use   Vaping Use: Never used  Substance and Sexual Activity   Alcohol use: Not Currently    Comment: QUIT  IN MAY 82993   Drug use: No   Sexual activity: Not on file  Other Topics Concern   Not on file  Social History Narrative   Not on file    Social Determinants of Health   Financial Resource Strain: Not on file  Food Insecurity: Not on file  Transportation Needs: Not on file  Physical Activity: Not on file  Stress: Not on file  Social Connections: Not on file   Past Surgical History:  Procedure Laterality Date   APPLICATION OF WOUND VAC  10/25/2019   Procedure: Application Of Wound Vac;  Surgeon: [name], MD;  Location: [place];  Service: Open Heart Surgery;;   CANNULATION FOR ECMO (EXTRACORPOREAL MEMBRANE OXYGENATION)  10/25/2019   Procedure: Cannulation For Ecmo (Extracorporeal Membrane Oxygenation) @ 1531;  Surgeon: Prescott Gum, Collier Salina, MD;  Location: [place];  Service: Open Heart Surgery;;   CANNULATION FOR ECMO (EXTRACORPOREAL MEMBRANE OXYGENATION) N/A 10/30/2019   Procedure: DECANNULATION OF ECMO (EXTRACORPOREAL MEMBRANE OXYGENATION);  Surgeon: Prescott Gum, Collier Salina, MD;  Location: [place];  Service: Open Heart Surgery;  Laterality: N/A;  Pump standby   CHEST EXPLORATION  10/25/2019   Procedure: Chest Exploration for repair of coronary perforation (repair of LAD);  Surgeon: Prescott Gum, Collier Salina, MD;  Location: [place];  Service: Open Heart Surgery;;   CORONARY CTO INTERVENTION N/A 10/25/2019   Procedure: CORONARY CTO INTERVENTION;  Surgeon: Martinique, Peter M, MD;  Location: [place] [facility];  Service: Cardiovascular;  Laterality: N/A;   CORONARY STENT INTERVENTION N/A 10/17/2019   Procedure: CORONARY STENT INTERVENTION;  Surgeon: Sherren Mocha, MD;  Location: [place] [facility];  Service: Cardiovascular;  Laterality: N/A;   LEFT HEART CATH AND CORONARY ANGIOGRAPHY N/A 10/17/2019   Procedure: LEFT HEART CATH AND CORONARY ANGIOGRAPHY;  Surgeon: Sherren Mocha, MD;  Location: [place] [facility];  Service: Cardiovascular;  Laterality: N/A;   [place]  10/25/2019   Procedure: Median Sternotomy.;  Surgeon: [name], MD;  Location: [facility];  Service: Open Heart Surgery;;   PLACEMENT OF IMPELLA LEFT VENTRICULAR ASSIST DEVICE   10/30/2019   Procedure: Placement Of Impella Left Ventricular Assist Device 5.5 USING 10MM HEMASHIELD PLATINUM GRAFT;  Surgeon: [name], MD;  Location: [place];  Service: Open Heart Surgery;;  graft placed into aorta   REMOVAL OF IMPELLA LEFT VENTRICULAR ASSIST DEVICE N/A 11/03/2019   Procedure: REMOVAL OF IMPELLA 5.5 LEFT VENTRICULAR ASSIST DEVICE;  Surgeon: [name], MD;  Location: [place];  Service: Open Heart Surgery;  Laterality: N/A;   STERNAL CLOSURE N/A 10/30/2019   Procedure: Sternal Closure;  Surgeon: [name], MD;  Location: [place];  Service: Open Heart Surgery;  Laterality: N/A;   STERNAL WIRES REMOVAL N/A 05/14/2020   Procedure: STERNAL WIRES REMOVAL;  Surgeon: [name], MD;  Location: [place];  Service: Thoracic;  Laterality: N/A;   TEE WITHOUT CARDIOVERSION N/A 10/25/2019   Procedure: TRANSESOPHAGEAL ECHOCARDIOGRAM (TEE);  Surgeon: Prescott Gum, Collier Salina, MD;  Location: [place];  Service: Open Heart Surgery;  Laterality: N/A;   TEE WITHOUT CARDIOVERSION N/A 10/30/2019   Procedure: TRANSESOPHAGEAL ECHOCARDIOGRAM (TEE);  Surgeon: Prescott Gum, Collier Salina, MD;  Location: [place];  Service: Open Heart Surgery;  Laterality: N/A;   TEE WITHOUT CARDIOVERSION N/A 11/03/2019   Procedure: TRANSESOPHAGEAL ECHOCARDIOGRAM (TEE);  Surgeon: Prescott Gum, Collier Salina, MD;  Location: [place];  Service: Open Heart Surgery;  Laterality: N/A;   Past Surgical History:  Procedure Laterality Date   APPLICATION OF WOUND VAC  10/25/2019   Procedure: Application Of Wound Vac;  Surgeon: [name], MD;  Location: [place];  Service: Open Heart Surgery;;   CANNULATION FOR ECMO (EXTRACORPOREAL MEMBRANE OXYGENATION)  10/25/2019   Procedure: Cannulation For Ecmo (Extracorporeal Membrane Oxygenation) @ 2542;  Surgeon: [name], MD;  Location: [place];  Service: Open Heart Surgery;;   CANNULATION FOR ECMO (EXTRACORPOREAL MEMBRANE OXYGENATION) N/A 10/30/2019   Procedure: DECANNULATION OF ECMO (EXTRACORPOREAL MEMBRANE  OXYGENATION);  Surgeon: Prescott Gum, Collier Salina, MD;  Location: [place];  Service: Open Heart Surgery;  Laterality: N/A;  Pump standby   CHEST EXPLORATION  10/25/2019   Procedure: Chest Exploration for repair of coronary perforation (repair of LAD);  Surgeon: Prescott Gum, Collier Salina, MD;  Location: [place];  Service: Open Heart Surgery;;   CORONARY CTO INTERVENTION N/A 10/25/2019   Procedure: CORONARY CTO INTERVENTION;  Surgeon: Martinique, Peter M, MD;  Location: [place] [facility];  Service: Cardiovascular;  Laterality: N/A;   CORONARY STENT INTERVENTION N/A 10/17/2019   Procedure: CORONARY STENT INTERVENTION;  Surgeon: Sherren Mocha, MD;  Location: [place] [facility];  Service: Cardiovascular;  Laterality: N/A;   LEFT HEART CATH AND CORONARY ANGIOGRAPHY N/A 10/17/2019   Procedure: LEFT HEART CATH AND CORONARY ANGIOGRAPHY;  Surgeon: Sherren Mocha, MD;  Location: [place] [facility];  Service: Cardiovascular;  Laterality: N/A;   [place]  10/25/2019   Procedure: Median Sternotomy.;  Surgeon: [name], MD;  Location:  [facility];  Service: Open Heart Surgery;;   PLACEMENT OF IMPELLA LEFT VENTRICULAR ASSIST DEVICE  10/30/2019   Procedure: Placement Of Impella Left Ventricular Assist Device 5.5 USING 10MM HEMASHIELD PLATINUM GRAFT;  Surgeon: [name], MD;  Location: [place];  Service: Open Heart Surgery;;  graft placed into aorta   REMOVAL OF IMPELLA LEFT VENTRICULAR ASSIST DEVICE N/A 11/03/2019   Procedure: REMOVAL OF IMPELLA 5.5 LEFT VENTRICULAR ASSIST DEVICE;  Surgeon: [name], MD;  Location: [place];  Service: Open Heart Surgery;  Laterality: N/A;   STERNAL CLOSURE N/A 10/30/2019   Procedure: Sternal Closure;  Surgeon: [name], MD;  Location: [place];  Service: Open Heart Surgery;  Laterality: N/A;   STERNAL WIRES REMOVAL N/A 05/14/2020   Procedure: STERNAL WIRES REMOVAL;  Surgeon: [name], MD;  Location: [place];  Service: Thoracic;  Laterality: N/A;   TEE WITHOUT CARDIOVERSION N/A 10/25/2019    Procedure: TRANSESOPHAGEAL ECHOCARDIOGRAM (TEE);  Surgeon: Prescott Gum, Collier Salina, MD;  Location: [place];  Service: Open Heart Surgery;  Laterality: N/A;   TEE WITHOUT CARDIOVERSION N/A 10/30/2019   Procedure: TRANSESOPHAGEAL ECHOCARDIOGRAM (TEE);  Surgeon: Prescott Gum, Collier Salina, MD;  Location: [place];  Service: Open Heart Surgery;  Laterality: N/A;   TEE WITHOUT CARDIOVERSION N/A 11/03/2019   Procedure: TRANSESOPHAGEAL ECHOCARDIOGRAM (TEE);  Surgeon: Prescott Gum, Collier Salina, MD;  Location: [place];  Service: Open Heart Surgery;  Laterality: N/A;   Past Medical History:  Diagnosis Date   Alcohol abuse    Anxiety    CAD (coronary artery disease)    Cardiac arrest ([place])    Cardiogenic shock (H[facility])    CHF (congestive heart failure) (HCC)    Cough    Dysrhythmia    GERD (gastroesophageal reflux disease)    HFrEF (heart failure with reduced ejection fraction) (HCC)    Hypertension    Hypertriglyceridemia    Ischemic cardiomyopathy    Right atrial thrombus    Tobacco abuse    BP 119/78   Pulse 78   Temp 98.7 F (37.1 C) (Oral)   Ht 5' 8"  (1.727 m)   Wt 188 lb 3.2 oz (85.4 kg)   SpO2 98%   BMI 28.62 kg/m   Opioid Risk Score:   Fall Risk Score:  `1  Depression screen PHQ 2/9  Depression screen [facility] 2/9 11/22/2020 10/25/2020 09/25/2020 09/03/2020 08/28/2020 06/28/2020 05/10/2020  Decreased Interest 0 0 0 0 0 0 0  Down, Depressed, Hopeless 0 0 0 0 0 0 0  PHQ - 2 Score 0 0 0 0 0 0 0  Altered sleeping - - - - - - 1  Tired, decreased energy - - - - - - 1  Change in appetite - - - - - - 0  Feeling bad or failure about yourself  - - - - - - 0  Trouble concentrating - - - - - - 0  Moving slowly or fidgety/restless - - - - - - 0  Suicidal thoughts - - - - - - 0  PHQ-9 Score - - - - - - 2  Difficult doing work/chores - - - - - - Somewhat difficult      Review of Systems     Objective:   Physical Exam Vitals and nursing note reviewed.  Constitutional:      Appearance: Normal appearance.  Cardiovascular:      Rate and Rhythm: Normal rate and regular rhythm.     Pulses: Normal pulses.  Heart sounds: Normal heart sounds.  Pulmonary:     Effort: Pulmonary effort is normal.     Breath sounds: Normal breath sounds.  Musculoskeletal:     Cervical back: Normal range of motion and neck supple.     Comments: Normal Muscle Bulk and Muscle Testing Reveals:  Upper Extremities: Full ROM and Muscle Strength 5/5 Bilateral AC Joint Tenderness  Lower Extremities: Full ROM and Muscle Strength 5/5 Arises from Table with ease Narrow Based Gait     Skin:    General: Skin is warm and dry.  Neurological:     Mental Status: He is alert and oriented to person, place, and time.  Psychiatric:        Mood and Affect: Mood normal.        Behavior: Behavior normal.         Assessment & Plan:  1. Left Shoulder Pain: Continue HEP as Tolerated. Continue current medication regimen. Continue to monitor. 01/23/2021 2.Left Greater Trochanter Bursitis: S/P Left Hip Injection with Dr Ranell Patrick on 09/03/2020, no relief noted he reports, Continue to alternate with Heat an Ice Therapy. Continue to monitor. 01/23/2021 3. Bilateral  Knees OA: Continue HEP as Tolerated. Continue current medication regimen. 01/23/2021 3. Chest Wall Discomfort/ Neuropathic Pain:  Continue Gabapentin. Continue to Monitor. 01/23/2021 4. Chronic Pain Syndrome: : Refilled: Oxycodone 10/325 mg one tablet 5 times a day as needed for pain #150. We will continue the opioid monitoring program, this consists of regular clinic visits, examinations, urine drug screen, pill counts as well as use of [place] Controlled Substance Reporting system. A 12 month History has been reviewed on the [place] Controlled Substance Reporting System on 01/23/2021.   F/U in 1 month

## 2021-01-23 NOTE — Telephone Encounter (Signed)
Patient in the office today for a f/u with Zella Ball and he mention a patch being placed on him. Looking at last note and I see noted him getting Qutenza. I let him know that Dr. Ranell Patrick will be the one to place that on him and that he is scheduled to see Dr. Ranell Patrick on 11/4 however the appt note does not mention Qutenza just letting you know so it will get authorized.

## 2021-01-27 ENCOUNTER — Encounter: Payer: Self-pay | Admitting: Registered Nurse

## 2021-02-04 ENCOUNTER — Other Ambulatory Visit: Payer: Self-pay

## 2021-02-04 ENCOUNTER — Encounter (HOSPITAL_BASED_OUTPATIENT_CLINIC_OR_DEPARTMENT_OTHER): Payer: BLUE CROSS/BLUE SHIELD | Admitting: Physical Medicine and Rehabilitation

## 2021-02-04 DIAGNOSIS — M170 Bilateral primary osteoarthritis of knee: Secondary | ICD-10-CM

## 2021-02-04 DIAGNOSIS — G4700 Insomnia, unspecified: Secondary | ICD-10-CM

## 2021-02-04 MED ORDER — PREDNISONE 10 MG (21) PO TBPK: ORAL_TABLET | ORAL | 0 refills | Status: DC

## 2021-02-04 MED ORDER — TRAZODONE HCL 50 MG PO TABS: 50.0000 mg | ORAL_TABLET | Freq: Every evening | ORAL | 3 refills | Status: DC | PRN

## 2021-02-04 NOTE — Progress Notes (Signed)
Subjective:    Patient ID: William Crawford, male    DOB: 1971-01-24, 50 y.o.   MRN: [id number]  HPI: An audio/video tele-health visit is felt to be the most appropriate encounter for this patient at this time. This is a follow up tele-visit via phone. The patient is at home. MD is at office.     William Crawford is a 50 y.o. male who returns for f/u appointment for chronic chest and knee pain. He states his pain is located in his left shoulder, lower back pain, left hip and bilateral knee pain. He was in contact with Dr Ranell Patrick regarding his increase intensity of pain, per Dr Ranell Patrick note his oxycodone will be increased to 5 times a day as needed for pain. William Crawford understanding.  He rates his pain 4. His current exercise regime is walking and performing stretching exercises.  William Crawford Morphine equivalent is 60.00  MME.   Last UDs was Performed on 08/28/2020, it was consistent.   He is having a good summer.   He fell down and hit his right ankle and got a big bruise- not hurting too bad.   He had a good time with his grandson riding rolling coasters at [employer]  His chest pain returned and it feels sore and he plans to f/u with his surgeon regarding the pain. It feels sharp, hot, like a burning He takes Gabapentin 613m- but that doesn't help unless used with the oxycodone.  Currently his pain is worse in bilateral knees- it is worst when he kneels, climbs ladders. He has a supportive community at work which allow him to limit his kneeling and he has been using pads when he kneels. His oxycodone is now only lasting him about 2.5 hours after he takes it and he has been taking them 5 times per day. He asks if there is anything stronger he can take. He has tried Voltaren gel in the past and its effect usually started an hour after application.      Pain Inventory Average Pain 8 Pain Right Now 7 My pain is sharp, burning, stabbing, tingling, and aching  In the last  24 hours, has pain interfered with the following? General activity 10 Relation with others 10 Enjoyment of life 10 What TIME of day is your pain at its worst? morning  and daytime Sleep (in general) Fair  Pain is worse with: walking and bending Pain improves with: rest and medication Relief from Meds: 9  Family History  Problem Relation Age of Onset   Heart attack Father    Heart disease Father    Heart attack Paternal Grandfather    Heart disease Paternal Grandfather    Heart attack Paternal Uncle    Heart disease Paternal Uncle    Heart attack Paternal Uncle    Heart disease Paternal Uncle    Social History   Socioeconomic History   Marital status: Single    Spouse name: Not on file   Number of children: Not on file   Years of education: Not on file   Highest education level: Not on file  Occupational History   Not on file  Tobacco Use   Smoking status: Former    Types: Cigarettes    Quit date: 10/23/2019    Years since quitting: 1.2   Smokeless tobacco: Never  Vaping Use   Vaping Use: Never used  Substance and Sexual Activity   Alcohol use: Not Currently    Comment:  QUIT  IN MAY 22297   Drug use: No   Sexual activity: Not on file  Other Topics Concern   Not on file  Social History Narrative   Not on file   Social Determinants of Health   Financial Resource Strain: Not on file  Food Insecurity: Not on file  Transportation Needs: Not on file  Physical Activity: Not on file  Stress: Not on file  Social Connections: Not on file   Past Surgical History:  Procedure Laterality Date   APPLICATION OF WOUND VAC  10/25/2019   Procedure: Application Of Wound Vac;  Surgeon: [name], MD;  Location: [place];  Service: Open Heart Surgery;;   CANNULATION FOR ECMO (EXTRACORPOREAL MEMBRANE OXYGENATION)  10/25/2019   Procedure: Cannulation For Ecmo (Extracorporeal Membrane Oxygenation) @ 1531;  Surgeon: [name], MD;  Location: [place];  Service: Open Heart  Surgery;;   CANNULATION FOR ECMO (EXTRACORPOREAL MEMBRANE OXYGENATION) N/A 10/30/2019   Procedure: DECANNULATION OF ECMO (EXTRACORPOREAL MEMBRANE OXYGENATION);  Surgeon: Prescott Gum, Collier Salina, MD;  Location: [place];  Service: Open Heart Surgery;  Laterality: N/A;  Pump standby   CHEST EXPLORATION  10/25/2019   Procedure: Chest Exploration for repair of coronary perforation (repair of LAD);  Surgeon: Prescott Gum, Collier Salina, MD;  Location: [place];  Service: Open Heart Surgery;;   CORONARY CTO INTERVENTION N/A 10/25/2019   Procedure: CORONARY CTO INTERVENTION;  Surgeon: Martinique, Peter M, MD;  Location: [facility];  Service: Cardiovascular;  Laterality: N/A;   CORONARY STENT INTERVENTION N/A 10/17/2019   Procedure: CORONARY STENT INTERVENTION;  Surgeon: Sherren Mocha, MD;  Location: [place] [facility];  Service: Cardiovascular;  Laterality: N/A;   LEFT HEART CATH AND CORONARY ANGIOGRAPHY N/A 10/17/2019   Procedure: LEFT HEART CATH AND CORONARY ANGIOGRAPHY;  Surgeon: Sherren Mocha, MD;  Location: [place] [facility];  Service: Cardiovascular;  Laterality: N/A;   [place]  10/25/2019   Procedure: Median Sternotomy.;  Surgeon: [name], MD;  Location: [facility];  Service: Open Heart Surgery;;   PLACEMENT OF IMPELLA LEFT VENTRICULAR ASSIST DEVICE  10/30/2019   Procedure: Placement Of Impella Left Ventricular Assist Device 5.5 USING 10MM HEMASHIELD PLATINUM GRAFT;  Surgeon: [name], MD;  Location: [place];  Service: Open Heart Surgery;;  graft placed into aorta   REMOVAL OF IMPELLA LEFT VENTRICULAR ASSIST DEVICE N/A 11/03/2019   Procedure: REMOVAL OF IMPELLA 5.5 LEFT VENTRICULAR ASSIST DEVICE;  Surgeon: [name], MD;  Location: [place];  Service: Open Heart Surgery;  Laterality: N/A;   STERNAL CLOSURE N/A 10/30/2019   Procedure: Sternal Closure;  Surgeon: [name], MD;  Location: [place];  Service: Open Heart Surgery;  Laterality: N/A;   STERNAL WIRES REMOVAL N/A 05/14/2020   Procedure:  STERNAL WIRES REMOVAL;  Surgeon: [name], MD;  Location: [place];  Service: Thoracic;  Laterality: N/A;   TEE WITHOUT CARDIOVERSION N/A 10/25/2019   Procedure: TRANSESOPHAGEAL ECHOCARDIOGRAM (TEE);  Surgeon: Prescott Gum, Collier Salina, MD;  Location: [place];  Service: Open Heart Surgery;  Laterality: N/A;   TEE WITHOUT CARDIOVERSION N/A 10/30/2019   Procedure: TRANSESOPHAGEAL ECHOCARDIOGRAM (TEE);  Surgeon: Prescott Gum, Collier Salina, MD;  Location: [place];  Service: Open Heart Surgery;  Laterality: N/A;   TEE WITHOUT CARDIOVERSION N/A 11/03/2019   Procedure: TRANSESOPHAGEAL ECHOCARDIOGRAM (TEE);  Surgeon: Prescott Gum, Collier Salina, MD;  Location: [place];  Service: Open Heart Surgery;  Laterality: N/A;   Past Surgical History:  Procedure Laterality Date  APPLICATION OF WOUND VAC  10/25/2019   Procedure: Application Of Wound Vac;  Surgeon: [name], MD;  Location: [place];  Service: Open Heart Surgery;;   CANNULATION FOR ECMO (EXTRACORPOREAL MEMBRANE OXYGENATION)  10/25/2019   Procedure: Cannulation For Ecmo (Extracorporeal Membrane Oxygenation) @ 0034;  Surgeon: [name], MD;  Location: [place];  Service: Open Heart Surgery;;   CANNULATION FOR ECMO (EXTRACORPOREAL MEMBRANE OXYGENATION) N/A 10/30/2019   Procedure: DECANNULATION OF ECMO (EXTRACORPOREAL MEMBRANE OXYGENATION);  Surgeon: Prescott Gum, Collier Salina, MD;  Location: [place];  Service: Open Heart Surgery;  Laterality: N/A;  Pump standby   CHEST EXPLORATION  10/25/2019   Procedure: Chest Exploration for repair of coronary perforation (repair of LAD);  Surgeon: Prescott Gum, Collier Salina, MD;  Location: [place];  Service: Open Heart Surgery;;   CORONARY CTO INTERVENTION N/A 10/25/2019   Procedure: CORONARY CTO INTERVENTION;  Surgeon: Martinique, Peter M, MD;  Location: [place] [facility];  Service: Cardiovascular;  Laterality: N/A;   CORONARY STENT INTERVENTION N/A 10/17/2019   Procedure: CORONARY STENT INTERVENTION;  Surgeon: Sherren Mocha, MD;  Location: [place] [facility];  Service:  Cardiovascular;  Laterality: N/A;   LEFT HEART CATH AND CORONARY ANGIOGRAPHY N/A 10/17/2019   Procedure: LEFT HEART CATH AND CORONARY ANGIOGRAPHY;  Surgeon: Sherren Mocha, MD;  Location: [place] [facility];  Service: Cardiovascular;  Laterality: N/A;   [place]  10/25/2019   Procedure: Median Sternotomy.;  Surgeon: [name], MD;  Location: [facility] OR;  Service: Open Heart Surgery;;   PLACEMENT OF IMPELLA LEFT VENTRICULAR ASSIST DEVICE  10/30/2019   Procedure: Placement Of Impella Left Ventricular Assist Device 5.5 USING 10MM HEMASHIELD PLATINUM GRAFT;  Surgeon: [name], MD;  Location: [place];  Service: Open Heart Surgery;;  graft placed into aorta   REMOVAL OF IMPELLA LEFT VENTRICULAR ASSIST DEVICE N/A 11/03/2019   Procedure: REMOVAL OF IMPELLA 5.5 LEFT VENTRICULAR ASSIST DEVICE;  Surgeon: [name], MD;  Location: [place];  Service: Open Heart Surgery;  Laterality: N/A;   STERNAL CLOSURE N/A 10/30/2019   Procedure: Sternal Closure;  Surgeon: [name], MD;  Location: [place];  Service: Open Heart Surgery;  Laterality: N/A;   STERNAL WIRES REMOVAL N/A 05/14/2020   Procedure: STERNAL WIRES REMOVAL;  Surgeon: [name], MD;  Location: [place];  Service: Thoracic;  Laterality: N/A;   TEE WITHOUT CARDIOVERSION N/A 10/25/2019   Procedure: TRANSESOPHAGEAL ECHOCARDIOGRAM (TEE);  Surgeon: Prescott Gum, Collier Salina, MD;  Location: [place];  Service: Open Heart Surgery;  Laterality: N/A;   TEE WITHOUT CARDIOVERSION N/A 10/30/2019   Procedure: TRANSESOPHAGEAL ECHOCARDIOGRAM (TEE);  Surgeon: Prescott Gum, Collier Salina, MD;  Location: [place];  Service: Open Heart Surgery;  Laterality: N/A;   TEE WITHOUT CARDIOVERSION N/A 11/03/2019   Procedure: TRANSESOPHAGEAL ECHOCARDIOGRAM (TEE);  Surgeon: Prescott Gum, Collier Salina, MD;  Location: [place];  Service: Open Heart Surgery;  Laterality: N/A;   Past Medical History:  Diagnosis Date   Alcohol abuse    Anxiety    CAD (coronary artery disease)    Cardiac arrest ([place])     Cardiogenic shock (H[facility])    CHF (congestive heart failure) (HCC)    Cough    Dysrhythmia    GERD (gastroesophageal reflux disease)    HFrEF (heart failure with reduced ejection fraction) (HCC)    Hypertension    Hypertriglyceridemia    Ischemic cardiomyopathy    Right atrial thrombus    Tobacco abuse    There were no vitals  taken for this visit.  Opioid Risk Score:   Fall Risk Score:  `1  Depression screen PHQ 2/9  Depression screen [facility] 2/9 11/22/2020 10/25/2020 09/25/2020 09/03/2020 08/28/2020 06/28/2020 05/10/2020  Decreased Interest 0 0 0 0 0 0 0  Down, Depressed, Hopeless 0 0 0 0 0 0 0  PHQ - 2 Score 0 0 0 0 0 0 0  Altered sleeping - - - - - - 1  Tired, decreased energy - - - - - - 1  Change in appetite - - - - - - 0  Feeling bad or failure about yourself  - - - - - - 0  Trouble concentrating - - - - - - 0  Moving slowly or fidgety/restless - - - - - - 0  Suicidal thoughts - - - - - - 0  PHQ-9 Score - - - - - - 2  Difficult doing work/chores - - - - - - Somewhat difficult     Review of Systems  Constitutional: Negative.   HENT: Negative.    Eyes: Negative.   Respiratory: Negative.    Cardiovascular: Negative.   Gastrointestinal: Negative.   Endocrine: Negative.   Genitourinary: Negative.   Musculoskeletal:  Positive for arthralgias, back pain and myalgias.  Skin: Negative.   Allergic/Immunologic: Negative.   Neurological:        Tingling  Hematological:  Bruises/bleeds easily.       Plavix  Psychiatric/Behavioral: Negative.    All other systems reviewed and are negative.     Objective:    Patient was seen via phone visit.       Assessment & Plan:  1. Left Shoulder Pain: Continue HEP as Tolerated. Continue current medication regimen. Continue to monitor. 11/22/2020 2.Left Greater Trochanter Bursitis: S/P Left Hip Injection with Dr Ranell Patrick on 09/03/2020, no relief noted he reports, Continue to alternate with Heat an Ice Therapy. Continue to monitor. 11/22/2020 3.  Bilateral  Knees OA: Continue HEP as Tolerated. Continue current medication regimen. 11/22/2020. Bilateral knee MRIs ordered. Advised application of voltaren gel. Advised icing in evenings. Prescribed prednisone taper.  3. Chest Wall Discomfort/ Neuropathic Pain: Continue Gabapentin. Continue to Monitor. 11/22/2020. Prescribed lidocaine patch.  -Discussed Qutenza as an option for neuropathic pain control. Discussed that this is a capsaicin patch, stronger than capsaicin cream. Discussed that it is currently approved for diabetic peripheral neuropathy and post-herpetic neuralgia, but that it has also shown benefit in treating other forms of neuropathy. Provided patient with link to site to learn more about the patch: [employer]fr. Discussed that the patch would be placed in office and benefits usually last 3 months. Discussed that unintended exposure to capsaicin can cause severe irritation of eyes, mucous membranes, respiratory tract, and skin, but that Qutenza is a local treatment and does not have the systemic side effects of other nerve medications. Discussed that there may be pain, itching, erythema, and decreased sensory function associated with the application of Qutenza. Side effects usually subside within 1 week. A cold pack of analgesic medications can help with these side effects. Blood pressure can also be increased due to pain associated with administration of the patch.  4. Chronic Pain Syndrome: : Refilled: Increased: Per Dr Ranell Patrick Note: Continue Oxycodone 10/325 mg one tablet 5 times a day as needed for pain #150. We will continue the opioid monitoring program, this consists of regular clinic visits, examinations, urine drug screen, pill counts as well as use of [place] Controlled Substance Reporting system. A 12  month History has been reviewed on the [place] Controlled Substance Reporting System on 11/22/2020. Discussed risks and benefits of opioid medications.   -Discussed following foods that may reduce pain: 1) Ginger (especially studied for arthritis)- reduce leukotriene production to decrease inflammation 2) Blueberries- high in phytonutrients that decrease inflammation 3) Salmon- marine omega-3s reduce joint swelling and pain 4) Pumpkin seeds- reduce inflammation 5) dark chocolate- reduces inflammation 6) turmeric- reduces inflammation 7) tart cherries - reduce pain and stiffness 8) extra virgin olive oil - its compound olecanthal helps to block prostaglandins  9) chili peppers- can be eaten or applied topically via capsaicin 10) mint- helpful for headache, muscle aches, joint pain, and itching 11) garlic- reduces inflammation  Link to further information on diet for chronic pain: http://www.randall.com/   5) Insomnia: prescribed trazodone 48m HS   F/u with EZella Ball1 month next 3 months, 4 months for Qutenza  13 minutes spent in discussion of his bilateral knee pain, aggravating and alleviating factors, giving conservative and pharmacologic recommendations.

## 2021-02-10 ENCOUNTER — Other Ambulatory Visit (HOSPITAL_COMMUNITY): Payer: Self-pay | Admitting: *Deleted

## 2021-02-10 DIAGNOSIS — I255 Ischemic cardiomyopathy: Secondary | ICD-10-CM

## 2021-02-10 DIAGNOSIS — I5022 Chronic systolic (congestive) heart failure: Secondary | ICD-10-CM

## 2021-02-10 MED ORDER — CARVEDILOL 3.125 MG PO TABS: ORAL_TABLET | ORAL | 0 refills | Status: DC

## 2021-02-17 ENCOUNTER — Other Ambulatory Visit: Payer: Self-pay | Admitting: Physical Medicine and Rehabilitation

## 2021-02-17 MED ORDER — LIDOCAINE 5 % EX PTCH: 1.0000 | MEDICATED_PATCH | CUTANEOUS | 1 refills | Status: DC

## 2021-02-18 ENCOUNTER — Encounter (HOSPITAL_BASED_OUTPATIENT_CLINIC_OR_DEPARTMENT_OTHER): Payer: BLUE CROSS/BLUE SHIELD | Admitting: Physical Medicine and Rehabilitation

## 2021-02-18 ENCOUNTER — Other Ambulatory Visit: Payer: Self-pay

## 2021-02-18 DIAGNOSIS — M5412 Radiculopathy, cervical region: Secondary | ICD-10-CM | POA: Diagnosis not present

## 2021-02-18 DIAGNOSIS — M545 Low back pain, unspecified: Secondary | ICD-10-CM

## 2021-02-18 DIAGNOSIS — G8929 Other chronic pain: Secondary | ICD-10-CM | POA: Diagnosis not present

## 2021-02-18 NOTE — Progress Notes (Signed)
Subjective:    Patient ID: William Crawford, male    DOB: 28-Sep-1970, 50 y.o.   MRN: [id number]  HPI: An audio/video tele-health visit is felt to be the most appropriate encounter for this patient at this time. This is a follow up tele-visit via phone. The patient is at home. MD is at office.     William Crawford is a 50 y.o. male who presents to discuss worsening low back pain.  -Pain has grown excruciating -He has been unable to work since Sunday -does not note radiation of pain into legs, but legs have given out dur to pain causing near fall -he does not note loss of bowel or bladder function -does note constipation -getting MRI of both knees tomorrow -also having shooting pain down both arms into pinky fingers  Prior history: He states his pain is located in his left shoulder, lower back pain, left hip and bilateral knee pain. He was in contact with Dr Ranell Patrick regarding his increase intensity of pain, per Dr Ranell Patrick note his oxycodone will be increased to 5 times a day as needed for pain. William Crawford understanding.  He rates his pain 4. His current exercise regime is walking and performing stretching exercises.  William Crawford Morphine equivalent is 60.00  MME.   Last UDs was Performed on 08/28/2020, it was consistent.   He is having a good summer.   He fell down and hit his right ankle and got a big bruise- not hurting too bad.   He had a good time with his grandson riding rolling coasters at [employer]  His chest pain returned and it feels sore and he plans to f/u with his surgeon regarding the pain. It feels sharp, hot, like a burning He takes Gabapentin 666m- but that doesn't help unless used with the oxycodone.  Currently his pain is worse in bilateral knees- it is worst when he kneels, climbs ladders. He has a supportive community at work which allow him to limit his kneeling and he has been using pads when he kneels. His oxycodone is now only lasting him about  2.5 hours after he takes it and he has been taking them 5 times per day. He asks if there is anything stronger he can take. He has tried Voltaren gel in the past and its effect usually started an hour after application.      Pain Inventory Average Pain 8 Pain Right Now 7 My pain is sharp, burning, stabbing, tingling, and aching  In the last 24 hours, has pain interfered with the following? General activity 10 Relation with others 10 Enjoyment of life 10 What TIME of day is your pain at its worst? morning  and daytime Sleep (in general) Fair  Pain is worse with: walking and bending Pain improves with: rest and medication Relief from Meds: 9  Family History  Problem Relation Age of Onset   Heart attack Father    Heart disease Father    Heart attack Paternal Grandfather    Heart disease Paternal Grandfather    Heart attack Paternal Uncle    Heart disease Paternal Uncle    Heart attack Paternal Uncle    Heart disease Paternal Uncle    Social History   Socioeconomic History   Marital status: Single    Spouse name: Not on file   Number of children: Not on file   Years of education: Not on file   Highest education level: Not on file  Occupational  History   Not on file  Tobacco Use   Smoking status: Former    Types: Cigarettes    Quit date: 10/23/2019    Years since quitting: 1.3   Smokeless tobacco: Never  Vaping Use   Vaping Use: Never used  Substance and Sexual Activity   Alcohol use: Not Currently    Comment: QUIT  IN MAY 25053   Drug use: No   Sexual activity: Not on file  Other Topics Concern   Not on file  Social History Narrative   Not on file   Social Determinants of Health   Financial Resource Strain: Not on file  Food Insecurity: Not on file  Transportation Needs: Not on file  Physical Activity: Not on file  Stress: Not on file  Social Connections: Not on file   Past Surgical History:  Procedure Laterality Date   APPLICATION OF WOUND VAC   10/25/2019   Procedure: Application Of Wound Vac;  Surgeon: [name], MD;  Location: [place];  Service: Open Heart Surgery;;   CANNULATION FOR ECMO (EXTRACORPOREAL MEMBRANE OXYGENATION)  10/25/2019   Procedure: Cannulation For Ecmo (Extracorporeal Membrane Oxygenation) @ 1531;  Surgeon: [name], MD;  Location: [place];  Service: Open Heart Surgery;;   CANNULATION FOR ECMO (EXTRACORPOREAL MEMBRANE OXYGENATION) N/A 10/30/2019   Procedure: DECANNULATION OF ECMO (EXTRACORPOREAL MEMBRANE OXYGENATION);  Surgeon: Prescott Gum, Collier Salina, MD;  Location: [place];  Service: Open Heart Surgery;  Laterality: N/A;  Pump standby   CHEST EXPLORATION  10/25/2019   Procedure: Chest Exploration for repair of coronary perforation (repair of LAD);  Surgeon: Prescott Gum, Collier Salina, MD;  Location: [place];  Service: Open Heart Surgery;;   CORONARY CTO INTERVENTION N/A 10/25/2019   Procedure: CORONARY CTO INTERVENTION;  Surgeon: Martinique, Peter M, MD;  Location: [place] [facility];  Service: Cardiovascular;  Laterality: N/A;   CORONARY STENT INTERVENTION N/A 10/17/2019   Procedure: CORONARY STENT INTERVENTION;  Surgeon: Sherren Mocha, MD;  Location: [place] [facility];  Service: Cardiovascular;  Laterality: N/A;   LEFT HEART CATH AND CORONARY ANGIOGRAPHY N/A 10/17/2019   Procedure: LEFT HEART CATH AND CORONARY ANGIOGRAPHY;  Surgeon: Sherren Mocha, MD;  Location: [place] [facility];  Service: Cardiovascular;  Laterality: N/A;   [place]  10/25/2019   Procedure: Median Sternotomy.;  Surgeon: [name], MD;  [facility];  Service: Open Heart Surgery;;   PLACEMENT OF IMPELLA LEFT VENTRICULAR ASSIST DEVICE  10/30/2019   Procedure: Placement Of Impella Left Ventricular Assist Device 5.5 USING 10MM HEMASHIELD PLATINUM GRAFT;  Surgeon: [name], MD;  Location: [place];  Service: Open Heart Surgery;;  graft placed into aorta   REMOVAL OF IMPELLA LEFT VENTRICULAR ASSIST DEVICE N/A 11/03/2019   Procedure: REMOVAL OF IMPELLA  5.5 LEFT VENTRICULAR ASSIST DEVICE;  Surgeon: [name], MD;  Location: [place];  Service: Open Heart Surgery;  Laterality: N/A;   STERNAL CLOSURE N/A 10/30/2019   Procedure: Sternal Closure;  Surgeon: [name], MD;  Location: [place];  Service: Open Heart Surgery;  Laterality: N/A;   STERNAL WIRES REMOVAL N/A 05/14/2020   Procedure: STERNAL WIRES REMOVAL;  Surgeon: [name], MD;  Location: [place];  Service: Thoracic;  Laterality: N/A;   TEE WITHOUT CARDIOVERSION N/A 10/25/2019   Procedure: TRANSESOPHAGEAL ECHOCARDIOGRAM (TEE);  Surgeon: Prescott Gum, Collier Salina, MD;  Location: [place];  Service: Open Heart Surgery;  Laterality: N/A;   TEE WITHOUT CARDIOVERSION N/A 10/30/2019   Procedure: TRANSESOPHAGEAL ECHOCARDIOGRAM (  TEE);  Surgeon: [name], MD;  Location: [place];  Service: Open Heart Surgery;  Laterality: N/A;   TEE WITHOUT CARDIOVERSION N/A 11/03/2019   Procedure: TRANSESOPHAGEAL ECHOCARDIOGRAM (TEE);  Surgeon: Prescott Gum, Collier Salina, MD;  Location: [place];  Service: Open Heart Surgery;  Laterality: N/A;   Past Surgical History:  Procedure Laterality Date   APPLICATION OF WOUND VAC  10/25/2019   Procedure: Application Of Wound Vac;  Surgeon: [name], MD;  Location: [place];  Service: Open Heart Surgery;;   CANNULATION FOR ECMO (EXTRACORPOREAL MEMBRANE OXYGENATION)  10/25/2019   Procedure: Cannulation For Ecmo (Extracorporeal Membrane Oxygenation) @ 4196;  Surgeon: [name], MD;  Location: [place];  Service: Open Heart Surgery;;   CANNULATION FOR ECMO (EXTRACORPOREAL MEMBRANE OXYGENATION) N/A 10/30/2019   Procedure: DECANNULATION OF ECMO (EXTRACORPOREAL MEMBRANE OXYGENATION);  Surgeon: Prescott Gum, Collier Salina, MD;  Location: [place];  Service: Open Heart Surgery;  Laterality: N/A;  Pump standby   CHEST EXPLORATION  10/25/2019   Procedure: Chest Exploration for repair of coronary perforation (repair of LAD);  Surgeon: Prescott Gum, Collier Salina, MD;  Location: [place];  Service: Open Heart Surgery;;    CORONARY CTO INTERVENTION N/A 10/25/2019   Procedure: CORONARY CTO INTERVENTION;  Surgeon: Martinique, Peter M, MD;  Location: [place] [facility];  Service: Cardiovascular;  Laterality: N/A;   CORONARY STENT INTERVENTION N/A 10/17/2019   Procedure: CORONARY STENT INTERVENTION;  Surgeon: Sherren Mocha, MD;  Location: [place] [facility];  Service: Cardiovascular;  Laterality: N/A;   LEFT HEART CATH AND CORONARY ANGIOGRAPHY N/A 10/17/2019   Procedure: LEFT HEART CATH AND CORONARY ANGIOGRAPHY;  Surgeon: Sherren Mocha, MD;  Location: [place] [facility];  Service: Cardiovascular;  Laterality: N/A;   [place]  10/25/2019   Procedure: Median Sternotomy.;  Surgeon: [name], MD;  [facility];  Service: Open Heart Surgery;;   PLACEMENT OF IMPELLA LEFT VENTRICULAR ASSIST DEVICE  10/30/2019   Procedure: Placement Of Impella Left Ventricular Assist Device 5.5 USING 10MM HEMASHIELD PLATINUM GRAFT;  Surgeon: [name], MD;  Location: [place];  Service: Open Heart Surgery;;  graft placed into aorta   REMOVAL OF IMPELLA LEFT VENTRICULAR ASSIST DEVICE N/A 11/03/2019   Procedure: REMOVAL OF IMPELLA 5.5 LEFT VENTRICULAR ASSIST DEVICE;  Surgeon: [name], MD;  Location: [place];  Service: Open Heart Surgery;  Laterality: N/A;   STERNAL CLOSURE N/A 10/30/2019   Procedure: Sternal Closure;  Surgeon: [name], MD;  Location: [place];  Service: Open Heart Surgery;  Laterality: N/A;   STERNAL WIRES REMOVAL N/A 05/14/2020   Procedure: STERNAL WIRES REMOVAL;  Surgeon: [name], MD;  Location: [place];  Service: Thoracic;  Laterality: N/A;   TEE WITHOUT CARDIOVERSION N/A 10/25/2019   Procedure: TRANSESOPHAGEAL ECHOCARDIOGRAM (TEE);  Surgeon: Prescott Gum, Collier Salina, MD;  Location: [place];  Service: Open Heart Surgery;  Laterality: N/A;   TEE WITHOUT CARDIOVERSION N/A 10/30/2019   Procedure: TRANSESOPHAGEAL ECHOCARDIOGRAM (TEE);  Surgeon: Prescott Gum, Collier Salina, MD;  Location: [place];  Service: Open Heart Surgery;   Laterality: N/A;   TEE WITHOUT CARDIOVERSION N/A 11/03/2019   Procedure: TRANSESOPHAGEAL ECHOCARDIOGRAM (TEE);  Surgeon: Prescott Gum, Collier Salina, MD;  Location: [place];  Service: Open Heart Surgery;  Laterality: N/A;   Past Medical History:  Diagnosis Date   Alcohol abuse    Anxiety    CAD (coronary artery disease)    Cardiac arrest ([place])    Cardiogenic shock (HCC)    CHF (  congestive heart failure) (HCC)    Cough    Dysrhythmia    GERD (gastroesophageal reflux disease)    HFrEF (heart failure with reduced ejection fraction) (HCC)    Hypertension    Hypertriglyceridemia    Ischemic cardiomyopathy    Right atrial thrombus    Tobacco abuse    There were no vitals taken for this visit.  Opioid Risk Score:   Fall Risk Score:  `1  Depression screen PHQ 2/9  Depression screen [facility] 2/9 11/22/2020 10/25/2020 09/25/2020 09/03/2020 08/28/2020 06/28/2020 05/10/2020  Decreased Interest 0 0 0 0 0 0 0  Down, Depressed, Hopeless 0 0 0 0 0 0 0  PHQ - 2 Score 0 0 0 0 0 0 0  Altered sleeping - - - - - - 1  Tired, decreased energy - - - - - - 1  Change in appetite - - - - - - 0  Feeling bad or failure about yourself  - - - - - - 0  Trouble concentrating - - - - - - 0  Moving slowly or fidgety/restless - - - - - - 0  Suicidal thoughts - - - - - - 0  PHQ-9 Score - - - - - - 2  Difficult doing work/chores - - - - - - Somewhat difficult     Review of Systems  Constitutional: Negative.   HENT: Negative.    Eyes: Negative.   Respiratory: Negative.    Cardiovascular: Negative.   Gastrointestinal: Negative.   Endocrine: Negative.   Genitourinary: Negative.   Musculoskeletal:  Positive for arthralgias, back pain and myalgias.  Skin: Negative.   Allergic/Immunologic: Negative.   Neurological:        Tingling  Hematological:  Bruises/bleeds easily.       Plavix  Psychiatric/Behavioral: Negative.    All other systems reviewed and are negative.     Objective:    Patient was seen via phone visit.        Assessment & Plan:  1. Left Shoulder Pain: Continue HEP as Tolerated. Continue current medication regimen. Continue to monitor. 11/22/2020 2.Left Greater Trochanter Bursitis: S/P Left Hip Injection with Dr Ranell Patrick on 09/03/2020, no relief noted he reports, Continue to alternate with Heat an Ice Therapy. Continue to monitor. 11/22/2020 3. Bilateral  Knees OA: Continue HEP as Tolerated. Continue current medication regimen. 11/22/2020. Bilateral knee MRIs ordered. Advised application of voltaren gel. Advised icing in evenings. Prescribed prednisone taper.  3. Chest Wall Discomfort/ Neuropathic Pain: Continue Gabapentin. Continue to Monitor. 11/22/2020. Prescribed lidocaine patch.  -Discussed Qutenza as an option for neuropathic pain control. Discussed that this is a capsaicin patch, stronger than capsaicin cream. Discussed that it is currently approved for diabetic peripheral neuropathy and post-herpetic neuralgia, but that it has also shown benefit in treating other forms of neuropathy. Provided patient with link to site to learn more about the patch: [employer].fr. Discussed that the patch would be placed in office and benefits usually last 3 months. Discussed that unintended exposure to capsaicin can cause severe irritation of eyes, mucous membranes, respiratory tract, and skin, but that Qutenza is a local treatment and does not have the systemic side effects of other nerve medications. Discussed that there may be pain, itching, erythema, and decreased sensory function associated with the application of Qutenza. Side effects usually subside within 1 week. A cold pack of analgesic medications can help with these side effects. Blood pressure can also be increased due to pain associated with administration of  the patch.  4. Chronic Pain Syndrome: : Refilled: Continue Oxycodone 10/325 mg one tablet 5 times a day as needed for pain #150. Can increase next refill to 6 pills per day. We will continue the  opioid monitoring program, this consists of regular clinic visits, examinations, urine drug screen, pill counts as well as use of [place] Controlled Substance Reporting system. A 12 month History has been reviewed on the [place] Controlled Substance Reporting System on 11/22/2020. Discussed risks and benefits of opioid medications.  -Discussed following foods that may reduce pain: 1) Ginger (especially studied for arthritis)- reduce leukotriene production to decrease inflammation 2) Blueberries- high in phytonutrients that decrease inflammation 3) Salmon- marine omega-3s reduce joint swelling and pain 4) Pumpkin seeds- reduce inflammation 5) dark chocolate- reduces inflammation 6) turmeric- reduces inflammation 7) tart cherries - reduce pain and stiffness 8) extra virgin olive oil - its compound olecanthal helps to block prostaglandins  9) chili peppers- can be eaten or applied topically via capsaicin 10) mint- helpful for headache, muscle aches, joint pain, and itching 11) garlic- reduces inflammation  Link to further information on diet for chronic pain: http://www.randall.com/   5) Insomnia: prescribed trazodone 36m HS  6) Low back pain: -Ordered STAT MRI given severity of patient's pain, suspicious for disc herniation, hopefully can get tomorrow when he is getting his knee MRIs.   7) Cervical radiuculitis: -Cervical XR ordered   F/u with me next Tuesday to discuss MRI and XR results  18 minutes spent in discussion of patient's back and knee pain, new pain shooting down both arms, appropriate imaging, follow-up plan

## 2021-02-19 ENCOUNTER — Other Ambulatory Visit: Payer: Self-pay

## 2021-02-19 ENCOUNTER — Ambulatory Visit
Admission: RE | Admit: 2021-02-19 | Discharge: 2021-02-19 | Disposition: A | Discharge status: Home / Self Care | Payer: BLUE CROSS/BLUE SHIELD | Source: Ambulatory Visit | Attending: Physical Medicine and Rehabilitation | Admitting: Physical Medicine and Rehabilitation

## 2021-02-19 ENCOUNTER — Emergency Department (HOSPITAL_COMMUNITY)
Admission: EM | Admit: 2021-02-19 | Discharge: 2021-02-19 | Discharge status: Against Medical Advice | Payer: BLUE CROSS/BLUE SHIELD

## 2021-02-19 DIAGNOSIS — M170 Bilateral primary osteoarthritis of knee: Secondary | ICD-10-CM

## 2021-02-19 NOTE — ED Triage Notes (Signed)
Pt misunderstood being sent by doctor, will contact PCP in the AM

## 2021-02-20 ENCOUNTER — Ambulatory Visit
Admission: RE | Admit: 2021-02-20 | Discharge: 2021-02-20 | Disposition: A | Discharge status: Home / Self Care | Payer: BLUE CROSS/BLUE SHIELD | Source: Ambulatory Visit | Attending: Physical Medicine and Rehabilitation | Admitting: Physical Medicine and Rehabilitation

## 2021-02-20 DIAGNOSIS — M5412 Radiculopathy, cervical region: Secondary | ICD-10-CM

## 2021-02-20 DIAGNOSIS — G8929 Other chronic pain: Secondary | ICD-10-CM

## 2021-02-20 DIAGNOSIS — M545 Low back pain, unspecified: Secondary | ICD-10-CM

## 2021-02-21 ENCOUNTER — Ambulatory Visit (HOSPITAL_COMMUNITY)
Admission: RE | Admit: 2021-02-21 | Discharge: 2021-02-21 | Disposition: A | Discharge status: Home / Self Care | Payer: BLUE CROSS/BLUE SHIELD | Source: Ambulatory Visit | Attending: Cardiology | Admitting: Cardiology

## 2021-02-21 ENCOUNTER — Ambulatory Visit (HOSPITAL_BASED_OUTPATIENT_CLINIC_OR_DEPARTMENT_OTHER)
Admission: RE | Admit: 2021-02-21 | Discharge: 2021-02-21 | Disposition: A | Discharge status: Home / Self Care | Payer: BLUE CROSS/BLUE SHIELD | Source: Ambulatory Visit | Attending: Cardiology | Admitting: Cardiology

## 2021-02-21 ENCOUNTER — Other Ambulatory Visit: Payer: Self-pay

## 2021-02-21 DIAGNOSIS — Z955 Presence of coronary angioplasty implant and graft: Secondary | ICD-10-CM | POA: Diagnosis not present

## 2021-02-21 DIAGNOSIS — I5022 Chronic systolic (congestive) heart failure: Secondary | ICD-10-CM | POA: Insufficient documentation

## 2021-02-21 DIAGNOSIS — Z87891 Personal history of nicotine dependence: Secondary | ICD-10-CM | POA: Diagnosis not present

## 2021-02-21 DIAGNOSIS — I110 Hypertensive heart disease with heart failure: Secondary | ICD-10-CM | POA: Insufficient documentation

## 2021-02-21 DIAGNOSIS — E781 Pure hyperglyceridemia: Secondary | ICD-10-CM | POA: Insufficient documentation

## 2021-02-21 DIAGNOSIS — Z8674 Personal history of sudden cardiac arrest: Secondary | ICD-10-CM | POA: Insufficient documentation

## 2021-02-21 DIAGNOSIS — Z8249 Family history of ischemic heart disease and other diseases of the circulatory system: Secondary | ICD-10-CM | POA: Diagnosis not present

## 2021-02-21 DIAGNOSIS — I255 Ischemic cardiomyopathy: Secondary | ICD-10-CM

## 2021-02-21 DIAGNOSIS — I340 Nonrheumatic mitral (valve) insufficiency: Secondary | ICD-10-CM | POA: Insufficient documentation

## 2021-02-21 DIAGNOSIS — Z79899 Other long term (current) drug therapy: Secondary | ICD-10-CM | POA: Diagnosis not present

## 2021-02-21 DIAGNOSIS — I252 Old myocardial infarction: Secondary | ICD-10-CM | POA: Diagnosis not present

## 2021-02-21 DIAGNOSIS — I2510 Atherosclerotic heart disease of native coronary artery without angina pectoris: Secondary | ICD-10-CM | POA: Diagnosis not present

## 2021-02-21 DIAGNOSIS — Z7902 Long term (current) use of antithrombotics/antiplatelets: Secondary | ICD-10-CM | POA: Diagnosis not present

## 2021-02-21 LAB — ECHOCARDIOGRAM COMPLETE
Area-P 1/2: 3.48 cm2
Calc EF: 49.3 %
S' Lateral: 3.2 cm
Single Plane A2C EF: 50.8 %
Single Plane A4C EF: 47.8 %

## 2021-02-21 MED ORDER — CARVEDILOL 3.125 MG PO TABS
ORAL_TABLET | ORAL | 6 refills | Status: DC
Start: 2021-02-21 — End: 2021-03-14

## 2021-02-21 NOTE — Patient Instructions (Signed)
Stop Aspirin. Continue Plavix 75 mg daily  No Lab work today  Your physician recommends that you schedule a follow-up appointment in: 6 months. Call office in February for March appointment.  If you have any questions or concerns before your next appointment please send [place] a message through [place] or call our office at [phone].    TO LEAVE A MESSAGE FOR THE NURSE SELECT OPTION 2, PLEASE LEAVE A MESSAGE INCLUDING: YOUR NAME DATE OF BIRTH CALL BACK NUMBER REASON FOR CALL**this is important as we prioritize the call backs  YOU WILL RECEIVE A CALL BACK THE SAME DAY AS LONG AS YOU CALL BEFORE 4:00 PM  At the [facility], you and your health needs are our priority. As part of our continuing mission to provide you with exceptional heart care, we have created designated Provider Care Teams. These Care Teams include your primary Cardiologist (physician) and Advanced Practice Providers (APPs- Physician Assistants and Nurse Practitioners) who all work together to provide you with the care you need, when you need it.   You may see any of the following providers on your designated Care Team at your next follow up: Dr Glori Bickers Dr Loralie Champagne Dr Patrice Paradise, NP Lyda Jester, [place] Ginnie Smart Audry Riles, PharmD   Please be sure to bring in all your medications bottles to every appointment.

## 2021-02-21 NOTE — Progress Notes (Signed)
  Echocardiogram 2D Echocardiogram has been performed.  William Crawford 02/21/2021, 11:57 AM

## 2021-02-22 NOTE — Progress Notes (Signed)
PCP: [name], MD  [facility] Cardiology: Dr. Aundra Dubin  50 y.o. with history of heavy smoking, heavy ETOH, and strong FH of CAD was initially admitted to [facility] on 10/17/19 with chest pain and NSTEMI.  LHC showed 95% distal RCA (culprit) with chronically occluded proximal LAD and OM1 with collaterals.  He had DES to distal RCA initially.  Echo showed EF 35-40% with inferior wall motion abnormality and normal RV.  He was discharged home with plan for CTO intervention on LAD.  He came for elective CTO procedure in 6/21, however he had perforation of the LAD with balloon inflation. A covered stent was placed.  Echo showed EF 45% with apical hypokinesis, moderate pericardial effusion and evidence for tamponade.  He was then taken to the OR for LAD repair and drainage of pericardial effusion.    In the OR, he had a VF arrest with about 15 minutes of CPR.  He was put on cardiopulmonary bypass and the tear in the LAD was repaired.  The LAD was not graftable.  TEE in the OR showed severe biventricular failure, suspect stunning post-VF arrest.  He was unable to come off bypass.  Therefore, he was cannulated for ECMO with venous drainage from RA and arterial return to ascending aorta.  After 4 days, ECMO was decannulated and Impella 5.5 was placed.  4 days after that, Impella was removed. He was eventually weaned off pressors/inotropes and discharged home.  Echo in 11/03/19 showed EF up to 45-50%.    Echo was done in 9/21 with EF 55% with mild anteroseptal hypokinesis and normal RV.   Patient was admitted with presyncope in 10/21.  Workup was unremarkable, thought to be possible vasovagal event.  As HR was around 50 at times, Coreg was stopped. Delene Loll was also decreased. Echo in 10/21 showed EF 50-55% with normal RV. Zio patch showed no significant arrhythmias.   Echo was done today and reviewed, EF 50% with mid inferolateral severe hypokinesis, normal RV, mild MR.   Patient returns for followup of CAD and CHF.   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  He is on gabapentin and followed in a pain clinic.  No exertional chest pain.  He is working as a Curator.  No significant exertional dyspnea. No lightheadedness.  He has low back pain chronically as well.  He had to stop spironolactone due to painful gynecomastia. No smoking, rare ETOH.   Labs (7/21): K 4.1, creatinine 1.08 Labs (8/21): K 3.9, creatinine 1.04, LDL 67, HDL 37, TGs 248 Labs (12/21): K 3.7, creatinine 1.2 Labs (3/22): LDL 57, HDl 36, TGs 80  ECG (personally reviewed): NSR, RBBB  PMH: 1. ETOH abuse 2. Smoking 3. GERD 4. CAD: NSTEMI in 5/21 with 95% distal RCA (culprit) and CTO LAD and OM1.  He had DES to distal RCA.  - He returned in 6/21 for CTO procedure to LAD, complicated by perforation of the LAD.  He was taken to the OR emergently for repair of the perforation.  Unable to graft the LAD.  5. VF arrest: 6/21 at time of LAD perforation/tamponade. 6. Ischemic cardiomyopathy:  - Echo (5/21): EF 35-40%. - Echo (6/21, post arrest): EF < 20%, moderately reduced RV function.  - ECMO in 6/21 post-LAD perforation and arrest.  - Echo (11/03/19): EF 45-50%.  - Echo (9/21): EF 55%, mild anteroseptal hypokinesis, normal RV.  - Echo (10/21): EF 50-55% with normal RV. 7. Right atrial thrombus:  Noted post-ECMO.  Warfarin.  8. Presyncopal episode in 10/21: Zio patch (10/21) with no significant arrhythmias noted.  9. Neuropathic chest wall pain post-sternotomy.   Social History   Socioeconomic History   Marital status: Single    Spouse name: Not on file   Number of children: Not on file   Years of education: Not on file   Highest education level: Not on file  Occupational History   Not on file  Tobacco Use   Smoking status: Former    Types: Cigarettes    Quit date: 10/23/2019    Years since quitting: 1.3   Smokeless tobacco: Never  Vaping Use   Vaping Use: Never used  Substance and Sexual  Activity   Alcohol use: Not Currently    Comment: QUIT  IN MAY 13086   Drug use: No   Sexual activity: Not on file  Other Topics Concern   Not on file  Social History Narrative   Not on file   Social Determinants of Health   Financial Resource Strain: Not on file  Food Insecurity: Not on file  Transportation Needs: Not on file  Physical Activity: Not on file  Stress: Not on file  Social Connections: Not on file  Intimate Partner Violence: Not on file   Family History  Problem Relation Age of Onset   Heart attack Father    Heart disease Father    Heart attack Paternal Grandfather    Heart disease Paternal Grandfather    Heart attack Paternal Uncle    Heart disease Paternal Uncle    Heart attack Paternal Uncle    Heart disease Paternal Uncle    ROS: All systems reviewed and negative except as per HPI.   Current Outpatient Medications  Medication Sig Dispense Refill   Ascorbic Acid (VITAMIN C) 100 MG tablet Take 100 mg by mouth daily.     atorvastatin (LIPITOR) 40 MG tablet Take 1 tablet (40 mg total) by mouth daily. 90 tablet 0   cetirizine (ZYRTEC) 10 MG tablet Take 10 mg by mouth daily.     Cholecalciferol (VITAMIN D-3) 125 MCG (5000 UT) TABS Take 1 tablet by mouth daily at 12 noon.     clopidogrel (PLAVIX) 75 MG tablet TAKE 1 TABLET(75 MG) BY MOUTH DAILY 90 tablet 3   fluticasone (FLONASE) 50 MCG/ACT nasal spray Place 2 sprays into both nostrils daily.     gabapentin (NEURONTIN) 600 MG tablet Take 1 tablet (600 mg total) by mouth 3 (three) times daily. 90 tablet 3   lidocaine (LIDODERM) 5 % Place 1 patch onto the skin daily. Remove & Discard patch within 12 hours or as directed by MD 30 patch 1   Misc Natural Products (OSTEO BI-FLEX ADV JOINT SHIELD) TABS Take 2 tablets by mouth.     oxyCODONE-acetaminophen (PERCOCET) 10-325 MG tablet Take 1 tablet by mouth 5 (five) times daily as needed for pain. Do Not Fill Before 02/14/2021 150 tablet 0   sacubitril-valsartan  (ENTRESTO) 49-51 MG Take 1 tablet by mouth 2 (two) times daily. 60 tablet 11   thiamine 100 MG tablet Take 1 tablet (100 mg total) by mouth daily. 30 tablet 1   traZODone (DESYREL) 50 MG tablet Take 1 tablet (50 mg total) by mouth at bedtime as needed for sleep. 30 tablet 3   triamcinolone (KENALOG) 0.025 % ointment Apply 1 application topically 2 (two) times daily. 30 g 0   VASCEPA 1 g capsule TAKE 2 CAPSULES(2 GRAMS) BY MOUTH TWICE DAILY  120 capsule 5   Vitamins C E (VITAMIN C & E COMPLEX PO) Take by mouth daily at 12 noon.     carvedilol (COREG) 3.125 MG tablet TAKE 1 TABLET(3.125 MG) BY MOUTH TWICE DAILY WITH A MEAL 60 tablet 6   No current facility-administered medications for this encounter.   BP 100/60   Pulse (!) 53   Wt 86 kg (189 lb 8 oz)   SpO2 98%   BMI 28.81 kg/m  General: NAD Neck: No JVD, no thyromegaly or thyroid nodule.  Lungs: Clear to auscultation bilaterally with normal respiratory effort. CV: Nondisplaced PMI.  Heart regular S1/S2, no S3/S4, no murmur.  No peripheral edema.  No carotid bruit.  Normal pedal pulses.  Abdomen: Soft, nontender, no hepatosplenomegaly, no distention.  Skin: Intact without lesions or rashes.  Neurologic: Alert and oriented x 3.  Psych: Normal affect. Extremities: No clubbing or cyanosis.  HEENT: Normal.   Assessment/Plan: 1. CAD: NSTEMI in 5/21 with DES to distal RCA.  He was left with occluded OM1 and LAD. CTO procedure was attempted to open LAD, but complicated by LAD perforation and tamponade/cardiac arrest requiring trip to OR and [facility] ECMO. The LAD was not graftable, he has no perfusion to the LAD territory except by collaterals.  No exertional chest pain.  - He is over a year out from NSTEMI.  I will have him stop ASA and continue clopidogrel 75 mg daily.  - Continue atorvastatin, good lipids in 3/22.  2. Cardiac arrest: In OR after LAD perforation/tamponade.  - With recovery of EF, he is off amiodarone.  3. RA thrombus: Related to  venous cannulation for ECMO (pulled from RA).   - Off warfarin with recovery of EF and no thrombus on last echo.  4. Smoking: He remains abstinent.  5. ETOH abuse: rare use.  6. Chronic systolic CHF: Ischemic cardiomyopathy.  Echo after cardiac arrest in 6/21 showed EF < 20% with moderate RV dysfunction.  Echo in 6/21 after decannulation of ECMO and removal of Impella 5.5 showed EF up to 45-50%.  Echo in 10/21 with EF 50-55%.  Echo today with EF 50%. On exam, he is not volume overloaded.  NYHA class I, working full time.  - Continue Coreg 3.125 mg bid.  - Continue Entresto 49/51 bid.  - Of spironolactone, he had painful gynecomastia that is likely due to spironolactone.  - He is not on a loop diuretic and does not appear to need one.  7. Hypertriglyceridemia: Continue Vascepa.  8. HTN: BP controlled.   Followup 6 months  Loralie Champagne 02/22/2021

## 2021-02-24 ENCOUNTER — Other Ambulatory Visit (HOSPITAL_COMMUNITY): Payer: Self-pay | Admitting: Unknown Physician Specialty

## 2021-02-24 ENCOUNTER — Other Ambulatory Visit (HOSPITAL_COMMUNITY): Payer: Self-pay

## 2021-02-24 MED ORDER — ICOSAPENT ETHYL 1 G PO CAPS: ORAL_CAPSULE | ORAL | 5 refills | Status: DC

## 2021-02-25 ENCOUNTER — Encounter
Payer: BLUE CROSS/BLUE SHIELD | Attending: Physical Medicine and Rehabilitation | Admitting: Physical Medicine and Rehabilitation

## 2021-02-25 ENCOUNTER — Other Ambulatory Visit: Payer: Self-pay

## 2021-02-25 DIAGNOSIS — M545 Low back pain, unspecified: Secondary | ICD-10-CM

## 2021-02-25 DIAGNOSIS — G894 Chronic pain syndrome: Secondary | ICD-10-CM | POA: Insufficient documentation

## 2021-02-25 DIAGNOSIS — Z5181 Encounter for therapeutic drug level monitoring: Secondary | ICD-10-CM | POA: Insufficient documentation

## 2021-02-25 DIAGNOSIS — M792 Neuralgia and neuritis, unspecified: Secondary | ICD-10-CM | POA: Diagnosis not present

## 2021-02-25 DIAGNOSIS — M25511 Pain in right shoulder: Secondary | ICD-10-CM | POA: Insufficient documentation

## 2021-02-25 DIAGNOSIS — M5412 Radiculopathy, cervical region: Secondary | ICD-10-CM

## 2021-02-25 DIAGNOSIS — Z79899 Other long term (current) drug therapy: Secondary | ICD-10-CM | POA: Insufficient documentation

## 2021-02-25 DIAGNOSIS — M9420 Chondromalacia, unspecified site: Secondary | ICD-10-CM | POA: Diagnosis not present

## 2021-02-25 DIAGNOSIS — G8929 Other chronic pain: Secondary | ICD-10-CM

## 2021-02-25 DIAGNOSIS — M170 Bilateral primary osteoarthritis of knee: Secondary | ICD-10-CM | POA: Insufficient documentation

## 2021-02-25 DIAGNOSIS — M25512 Pain in left shoulder: Secondary | ICD-10-CM | POA: Insufficient documentation

## 2021-02-25 MED ORDER — DULOXETINE HCL 20 MG PO CPEP: 20.0000 mg | ORAL_CAPSULE | Freq: Every day | ORAL | 3 refills | Status: DC

## 2021-02-25 NOTE — Progress Notes (Signed)
Subjective:    Patient ID: William Crawford, male    DOB: 1970-12-09, 50 y.o.   MRN: [id number]  HPI: An audio/video tele-health visit is felt to be the most appropriate encounter for this patient at this time. This is a follow up tele-visit via phone. The patient is at home. MD is at office.      William Crawford is a 50 y.o. male who presents to discuss worsening low back pain, bilateral knee pain, chest pain, and neck pain.   -Pain has grown excruciating but he has been able to return to work as he needs to financially. -Discussed results of his bilateral knee MRI, his neck XR, and his back XR -He continues to have chest pain and had recent follow-up with cardiology. Would still like to try the Qutenza patch -he has never tried Cymbalta and would be willing to -he requests if oxycodone can be increased to 6 tablets per day.  -does not note radiation of pain into legs, but legs have given out dur to pain causing near fall -he does not note loss of bowel or bladder function -does note constipation -also having shooting pain down both arms into pinky fingers  Prior history: He states his pain is located in his left shoulder, lower back pain, left hip and bilateral knee pain. He was in contact with Dr Ranell Patrick regarding his increase intensity of pain, per Dr Ranell Patrick note his oxycodone will be increased to 5 times a day as needed for pain. William Crawford understanding.  He rates his pain 4. His current exercise regime is walking and performing stretching exercises.  Mr. Votta Morphine equivalent is 60.00  MME.   Last UDs was Performed on 08/28/2020, it was consistent.   He is having a good summer.   He fell down and hit his right ankle and got a big bruise- not hurting too bad.   He had a good time with his grandson riding rolling coasters at [employer]  His chest pain returned and it feels sore and he plans to f/u with his surgeon regarding the pain. It feels sharp, hot,  like a burning He takes Gabapentin 666m- but that doesn't help unless used with the oxycodone.  Currently his pain is worse in bilateral knees- it is worst when he kneels, climbs ladders. He has a supportive community at work which allow him to limit his kneeling and he has been using pads when he kneels. His oxycodone is now only lasting him about 2.5 hours after he takes it and he has been taking them 5 times per day. He asks if there is anything stronger he can take. He has tried Voltaren gel in the past and its effect usually started an hour after application.      Pain Inventory Average Pain 8 Pain Right Now 7 My pain is sharp, burning, stabbing, tingling, and aching  In the last 24 hours, has pain interfered with the following? General activity 10 Relation with others 10 Enjoyment of life 10 What TIME of day is your pain at its worst? morning  and daytime Sleep (in general) Fair  Pain is worse with: walking and bending Pain improves with: rest and medication Relief from Meds: 9  Family History  Problem Relation Age of Onset   Heart attack Father    Heart disease Father    Heart attack Paternal Grandfather    Heart disease Paternal Grandfather    Heart attack Paternal Uncle  Heart disease Paternal Uncle    Heart attack Paternal Uncle    Heart disease Paternal Uncle    Social History   Socioeconomic History   Marital status: Single    Spouse name: Not on file   Number of children: Not on file   Years of education: Not on file   Highest education level: Not on file  Occupational History   Not on file  Tobacco Use   Smoking status: Former    Types: Cigarettes    Quit date: 10/23/2019    Years since quitting: 1.3   Smokeless tobacco: Never  Vaping Use   Vaping Use: Never used  Substance and Sexual Activity   Alcohol use: Not Currently    Comment: QUIT  IN MAY [address]03   Drug use: No   Sexual activity: Not on file  Other Topics Concern   Not on file  Social  History Narrative   Not on file   Social Determinants of Health   Financial Resource Strain: Not on file  Food Insecurity: Not on file  Transportation Needs: Not on file  Physical Activity: Not on file  Stress: Not on file  Social Connections: Not on file   Past Surgical History:  Procedure Laterality Date   APPLICATION OF WOUND VAC  10/25/2019   Procedure: Application Of Wound Vac;  Surgeon: [name], MD;  Location: [place];  Service: Open Heart Surgery;;   CANNULATION FOR ECMO (EXTRACORPOREAL MEMBRANE OXYGENATION)  10/25/2019   Procedure: Cannulation For Ecmo (Extracorporeal Membrane Oxygenation) @ 1531;  Surgeon: Prescott Gum, Collier Salina, MD;  Location: [place];  Service: Open Heart Surgery;;   CANNULATION FOR ECMO (EXTRACORPOREAL MEMBRANE OXYGENATION) N/A 10/30/2019   Procedure: DECANNULATION OF ECMO (EXTRACORPOREAL MEMBRANE OXYGENATION);  Surgeon: Prescott Gum, Collier Salina, MD;  Location: [place];  Service: Open Heart Surgery;  Laterality: N/A;  Pump standby   CHEST EXPLORATION  10/25/2019   Procedure: Chest Exploration for repair of coronary perforation (repair of LAD);  Surgeon: Prescott Gum, Collier Salina, MD;  Location: [place];  Service: Open Heart Surgery;;   CORONARY CTO INTERVENTION N/A 10/25/2019   Procedure: CORONARY CTO INTERVENTION;  Surgeon: Martinique, Peter M, MD;  Location: [place] [facility];  Service: Cardiovascular;  Laterality: N/A;   CORONARY STENT INTERVENTION N/A 10/17/2019   Procedure: CORONARY STENT INTERVENTION;  Surgeon: Sherren Mocha, MD;  Location: [place] [facility];  Service: Cardiovascular;  Laterality: N/A;   LEFT HEART CATH AND CORONARY ANGIOGRAPHY N/A 10/17/2019   Procedure: LEFT HEART CATH AND CORONARY ANGIOGRAPHY;  Surgeon: Sherren Mocha, MD;  Location: [place] [facility];  Service: Cardiovascular;  Laterality: N/A;   [place]  10/25/2019   Procedure: Median Sternotomy.;  Surgeon: [name], MD;  Location: [facility];  Service: Open Heart Surgery;;   PLACEMENT OF IMPELLA LEFT  VENTRICULAR ASSIST DEVICE  10/30/2019   Procedure: Placement Of Impella Left Ventricular Assist Device 5.5 USING 10MM HEMASHIELD PLATINUM GRAFT;  Surgeon: [name], MD;  Location: [place];  Service: Open Heart Surgery;;  graft placed into aorta   REMOVAL OF IMPELLA LEFT VENTRICULAR ASSIST DEVICE N/A 11/03/2019   Procedure: REMOVAL OF IMPELLA 5.5 LEFT VENTRICULAR ASSIST DEVICE;  Surgeon: [name], MD;  Location: [place];  Service: Open Heart Surgery;  Laterality: N/A;   STERNAL CLOSURE N/A 10/30/2019   Procedure: Sternal Closure;  Surgeon: [name], MD;  Location: [place];  Service: Open Heart Surgery;  Laterality: N/A;   STERNAL WIRES REMOVAL N/A  05/14/2020   Procedure: STERNAL WIRES REMOVAL;  Surgeon: [name], MD;  Location: [place];  Service: Thoracic;  Laterality: N/A;   TEE WITHOUT CARDIOVERSION N/A 10/25/2019   Procedure: TRANSESOPHAGEAL ECHOCARDIOGRAM (TEE);  Surgeon: Prescott Gum, Collier Salina, MD;  Location: [place];  Service: Open Heart Surgery;  Laterality: N/A;   TEE WITHOUT CARDIOVERSION N/A 10/30/2019   Procedure: TRANSESOPHAGEAL ECHOCARDIOGRAM (TEE);  Surgeon: Prescott Gum, Collier Salina, MD;  Location: [place];  Service: Open Heart Surgery;  Laterality: N/A;   TEE WITHOUT CARDIOVERSION N/A 11/03/2019   Procedure: TRANSESOPHAGEAL ECHOCARDIOGRAM (TEE);  Surgeon: Prescott Gum, Collier Salina, MD;  Location: [place];  Service: Open Heart Surgery;  Laterality: N/A;   Past Surgical History:  Procedure Laterality Date   APPLICATION OF WOUND VAC  10/25/2019   Procedure: Application Of Wound Vac;  Surgeon: [name], MD;  Location: [place];  Service: Open Heart Surgery;;   CANNULATION FOR ECMO (EXTRACORPOREAL MEMBRANE OXYGENATION)  10/25/2019   Procedure: Cannulation For Ecmo (Extracorporeal Membrane Oxygenation) @ 7619;  Surgeon: [name], MD;  Location: [place];  Service: Open Heart Surgery;;   CANNULATION FOR ECMO (EXTRACORPOREAL MEMBRANE OXYGENATION) N/A 10/30/2019   Procedure: DECANNULATION OF ECMO  (EXTRACORPOREAL MEMBRANE OXYGENATION);  Surgeon: Prescott Gum, Collier Salina, MD;  Location: [place];  Service: Open Heart Surgery;  Laterality: N/A;  Pump standby   CHEST EXPLORATION  10/25/2019   Procedure: Chest Exploration for repair of coronary perforation (repair of LAD);  Surgeon: Prescott Gum, Collier Salina, MD;  Location: [place];  Service: Open Heart Surgery;;   CORONARY CTO INTERVENTION N/A 10/25/2019   Procedure: CORONARY CTO INTERVENTION;  Surgeon: Martinique, Peter M, MD;  Location: [place] [facility];  Service: Cardiovascular;  Laterality: N/A;   CORONARY STENT INTERVENTION N/A 10/17/2019   Procedure: CORONARY STENT INTERVENTION;  Surgeon: Sherren Mocha, MD;  Location: [place] [facility];  Service: Cardiovascular;  Laterality: N/A;   LEFT HEART CATH AND CORONARY ANGIOGRAPHY N/A 10/17/2019   Procedure: LEFT HEART CATH AND CORONARY ANGIOGRAPHY;  Surgeon: Sherren Mocha, MD;  Location: [place] [facility];  Service: Cardiovascular;  Laterality: N/A;   [place]  10/25/2019   Procedure: Median Sternotomy.;  Surgeon: [name], MD;  [facility];  Service: Open Heart Surgery;;   PLACEMENT OF IMPELLA LEFT VENTRICULAR ASSIST DEVICE  10/30/2019   Procedure: Placement Of Impella Left Ventricular Assist Device 5.5 USING 10MM HEMASHIELD PLATINUM GRAFT;  Surgeon: [name], MD;  Location: [place];  Service: Open Heart Surgery;;  graft placed into aorta   REMOVAL OF IMPELLA LEFT VENTRICULAR ASSIST DEVICE N/A 11/03/2019   Procedure: REMOVAL OF IMPELLA 5.5 LEFT VENTRICULAR ASSIST DEVICE;  Surgeon: [name], MD;  Location: [place];  Service: Open Heart Surgery;  Laterality: N/A;   STERNAL CLOSURE N/A 10/30/2019   Procedure: Sternal Closure;  Surgeon: [name], MD;  Location: [place];  Service: Open Heart Surgery;  Laterality: N/A;   STERNAL WIRES REMOVAL N/A 05/14/2020   Procedure: STERNAL WIRES REMOVAL;  Surgeon: [name], MD;  Location: [place];  Service: Thoracic;  Laterality: N/A;   TEE WITHOUT  CARDIOVERSION N/A 10/25/2019   Procedure: TRANSESOPHAGEAL ECHOCARDIOGRAM (TEE);  Surgeon: Prescott Gum, Collier Salina, MD;  Location: [place];  Service: Open Heart Surgery;  Laterality: N/A;   TEE WITHOUT CARDIOVERSION N/A 10/30/2019   Procedure: TRANSESOPHAGEAL ECHOCARDIOGRAM (TEE);  Surgeon: Prescott Gum, Collier Salina, MD;  Location: [place];  Service: Open Heart Surgery;  Laterality: N/A;   TEE WITHOUT  CARDIOVERSION N/A 11/03/2019   Procedure: TRANSESOPHAGEAL ECHOCARDIOGRAM (TEE);  Surgeon: Prescott Gum, Collier Salina, MD;  Location: [place];  Service: Open Heart Surgery;  Laterality: N/A;   Past Medical History:  Diagnosis Date   Alcohol abuse    Anxiety    CAD (coronary artery disease)    Cardiac arrest ([place])    Cardiogenic shock (HCC)    CHF (congestive heart failure) (HCC)    Cough    Dysrhythmia    GERD (gastroesophageal reflux disease)    HFrEF (heart failure with reduced ejection fraction) (HCC)    Hypertension    Hypertriglyceridemia    Ischemic cardiomyopathy    Right atrial thrombus    Tobacco abuse    There were no vitals taken for this visit.  Opioid Risk Score:   Fall Risk Score:  `1  Depression screen PHQ 2/9  Depression screen [facility] 2/9 11/22/2020 10/25/2020 09/25/2020 09/03/2020 08/28/2020 06/28/2020 05/10/2020  Decreased Interest 0 0 0 0 0 0 0  Down, Depressed, Hopeless 0 0 0 0 0 0 0  PHQ - 2 Score 0 0 0 0 0 0 0  Altered sleeping - - - - - - 1  Tired, decreased energy - - - - - - 1  Change in appetite - - - - - - 0  Feeling bad or failure about yourself  - - - - - - 0  Trouble concentrating - - - - - - 0  Moving slowly or fidgety/restless - - - - - - 0  Suicidal thoughts - - - - - - 0  PHQ-9 Score - - - - - - 2  Difficult doing work/chores - - - - - - Somewhat difficult     Review of Systems  Constitutional: Negative.   HENT: Negative.    Eyes: Negative.   Respiratory: Negative.    Cardiovascular: Negative.   Gastrointestinal: Negative.   Endocrine: Negative.   Genitourinary: Negative.    Musculoskeletal:  Positive for arthralgias, back pain and myalgias.  Skin: Negative.   Allergic/Immunologic: Negative.   Neurological:        Tingling  Hematological:  Bruises/bleeds easily.       Plavix  Psychiatric/Behavioral: Negative.    All other systems reviewed and are negative.     Objective:    Patient was seen via phone visit.       Assessment & Plan:  1. Left Shoulder Pain: Continue HEP as Tolerated. Continue current medication regimen. Continue to monitor. 11/22/2020 2.Left Greater Trochanter Bursitis: S/P Left Hip Injection with Dr Ranell Patrick on 09/03/2020, no relief noted he reports, Continue to alternate with Heat an Ice Therapy. Continue to monitor. 11/22/2020 3. Bilateral  Knees chondral defects: Continue HEP as Tolerated. Continue current medication regimen. 11/22/2020. Discussed results of bilateral knee MRIs which showed chondral deficits. Advised application of voltaren gel. Advised icing in evenings. Did not benefit from prior steroid taper. 3. Chest Wall Discomfort/ Neuropathic Pain: Continue Gabapentin. Continue to Monitor. 11/22/2020. Prescribed lidocaine patch.  -Cymbalta 34m daily prescribed -Discussed Qutenza as an option for neuropathic pain control. Discussed that this is a capsaicin patch, stronger than capsaicin cream. Discussed that it is currently approved for diabetic peripheral neuropathy and post-herpetic neuralgia, but that it has also shown benefit in treating other forms of neuropathy. Provided patient with link to site to learn more about the patch: hCinemaBonus.fr Discussed that the patch would be placed in office and benefits usually last 3 months. Discussed that unintended exposure to capsaicin can  cause severe irritation of eyes, mucous membranes, respiratory tract, and skin, but that Qutenza is a local treatment and does not have the systemic side effects of other nerve medications. Discussed that there may be pain, itching, erythema, and  decreased sensory function associated with the application of Qutenza. Side effects usually subside within 1 week. A cold pack of analgesic medications can help with these side effects. Blood pressure can also be increased due to pain associated with administration of the patch.  -4. Chronic Pain Syndrome: :  Increase Oxycodone 10/325 mg to one tablet 6 times a day as needed for pain #180. We will continue the opioid monitoring program, this consists of regular clinic visits, examinations, urine drug screen, pill counts as well as use of [place] Controlled Substance Reporting system. A 12 month History has been reviewed on the [place] Controlled Substance Reporting System on 11/22/2020. Discussed risks and benefits of opioid medications.  -Discussed following foods that may reduce pain: 1) Ginger (especially studied for arthritis)- reduce leukotriene production to decrease inflammation 2) Blueberries- high in phytonutrients that decrease inflammation 3) Salmon- marine omega-3s reduce joint swelling and pain 4) Pumpkin seeds- reduce inflammation 5) dark chocolate- reduces inflammation 6) turmeric- reduces inflammation 7) tart cherries - reduce pain and stiffness 8) extra virgin olive oil - its compound olecanthal helps to block prostaglandins  9) chili peppers- can be eaten or applied topically via capsaicin 10) mint- helpful for headache, muscle aches, joint pain, and itching 11) garlic- reduces inflammation  Link to further information on diet for chronic pain: http://www.randall.com/   5) Insomnia: prescribed trazodone 73m HS  6) Low back pain: -Discussed results of MRI which shows disc degeneration -discussed that muscle and ligament pathology can also cause severe pain at times -encouraged continued exercise and working to send blood flow to injured area and promote healing  7) Cervical  radiuculitis: -Discussed that cervical XR showed disc degeneration   8 minutes spent in discussion of patient's bilateral knee MRIs, lumbar and cervical spine XRs, discussion of increase in oxycodone to 6 tablets per day, trial of Qutenza for neuropathic chest pain, encouragement of continued movement and exercise

## 2021-03-01 ENCOUNTER — Telehealth: Payer: Self-pay | Admitting: Student

## 2021-03-01 ENCOUNTER — Telehealth (HOSPITAL_COMMUNITY): Payer: Self-pay | Admitting: Pharmacy Technician

## 2021-03-01 NOTE — Telephone Encounter (Signed)
   Patient called Answering Service about concerns of Vascepa. Called and spoke with patient. He states he has been out of Vascepa for a few days now and was told that his insurance is requiring a prior authorization. There is another telephone note from today from [facility], [place], stating this as well. PA has been submitted and status is pending. I notified patient of this. I notified patient that we should be in touch once we hear back. He was very appreciative of the call. Will route this to Benjamine Mola so she is aware.  Darreld Mclean, PA-C 03/01/2021 12:16 PM

## 2021-03-01 NOTE — Telephone Encounter (Signed)
Patient Advocate Encounter   Received notification from [employer] that prior authorization for William Crawford is required.   PA submitted on CoverMyMeds Key [id number] Status is pending   Will continue to follow.

## 2021-03-03 ENCOUNTER — Other Ambulatory Visit: Payer: Self-pay

## 2021-03-03 ENCOUNTER — Other Ambulatory Visit (HOSPITAL_COMMUNITY): Payer: Self-pay

## 2021-03-03 NOTE — Telephone Encounter (Signed)
Advanced Heart Failure Patient Advocate Encounter  Prior Authorization for Dalene Seltzer has been approved.    Effective dates: 03/01/21 through 02/28/22  Patients co-pay is $10  Called and spoke with the patient.   Charlann Boxer, CPhT

## 2021-03-10 ENCOUNTER — Encounter: Payer: BLUE CROSS/BLUE SHIELD | Admitting: Registered Nurse

## 2021-03-10 ENCOUNTER — Other Ambulatory Visit: Payer: Self-pay | Admitting: Cardiology

## 2021-03-10 ENCOUNTER — Encounter: Payer: Self-pay | Admitting: Registered Nurse

## 2021-03-10 ENCOUNTER — Other Ambulatory Visit: Payer: Self-pay

## 2021-03-10 VITALS — BP 104/65 | HR 76 | Temp 98.5°F | Ht 68.0 in | Wt 186.6 lb

## 2021-03-10 DIAGNOSIS — M25512 Pain in left shoulder: Secondary | ICD-10-CM

## 2021-03-10 DIAGNOSIS — Z5181 Encounter for therapeutic drug level monitoring: Secondary | ICD-10-CM | POA: Diagnosis present

## 2021-03-10 DIAGNOSIS — M25511 Pain in right shoulder: Secondary | ICD-10-CM | POA: Diagnosis present

## 2021-03-10 DIAGNOSIS — M792 Neuralgia and neuritis, unspecified: Secondary | ICD-10-CM | POA: Diagnosis present

## 2021-03-10 DIAGNOSIS — Z79899 Other long term (current) drug therapy: Secondary | ICD-10-CM | POA: Diagnosis present

## 2021-03-10 DIAGNOSIS — M170 Bilateral primary osteoarthritis of knee: Secondary | ICD-10-CM | POA: Diagnosis present

## 2021-03-10 DIAGNOSIS — G894 Chronic pain syndrome: Secondary | ICD-10-CM

## 2021-03-10 DIAGNOSIS — M9420 Chondromalacia, unspecified site: Secondary | ICD-10-CM | POA: Diagnosis present

## 2021-03-10 DIAGNOSIS — G8929 Other chronic pain: Secondary | ICD-10-CM | POA: Diagnosis present

## 2021-03-10 DIAGNOSIS — M545 Low back pain, unspecified: Secondary | ICD-10-CM

## 2021-03-10 DIAGNOSIS — M5412 Radiculopathy, cervical region: Secondary | ICD-10-CM | POA: Diagnosis present

## 2021-03-10 MED ORDER — OXYCODONE-ACETAMINOPHEN 10-325 MG PO TABS: 1.0000 | ORAL_TABLET | ORAL | 0 refills | Status: DC | PRN

## 2021-03-10 MED ORDER — GABAPENTIN 600 MG PO TABS: 600.0000 mg | ORAL_TABLET | Freq: Three times a day (TID) | ORAL | 3 refills | Status: DC

## 2021-03-10 NOTE — Progress Notes (Signed)
Subjective:    Patient ID: William Crawford, male    DOB: [birth year], 50 y.o.   MRN: [id number]  HPI: William Crawford is a 50 y.o. male who returns for follow up appointment for chronic pain and medication refill. He states his pain is located in his lower back and bilateral knee pain. He rates his pain 9. His current exercise regime is walking and performing stretching exercises. Mr. Willmann oxycodone was increased to 6 times a day by Dr Octavio Manns on 02/25/2021.  Mr. Pease Morphine equivalent is 75.00 MME.  Last UDS was Performed on 12/27/2020, it was consistent.      Pain Inventory Average Pain 10 Pain Right Now 9 My pain is intermittent, sharp, burning, stabbing, tingling, and aching  In the last 24 hours, has pain interfered with the following? General activity 10 Relation with others 4 Enjoyment of life 6 What TIME of day is your pain at its worst? morning , daytime, and evening Sleep (in general) Fair  Pain is worse with: walking, bending, sitting, standing, and some activites Pain improves with: rest and medication Relief from Meds: 7  Family History  Problem Relation Age of Onset   Heart attack Father    Heart disease Father    Heart attack Paternal Grandfather    Heart disease Paternal Grandfather    Heart attack Paternal Uncle    Heart disease Paternal Uncle    Heart attack Paternal Uncle    Heart disease Paternal Uncle    Social History   Socioeconomic History   Marital status: Single    Spouse name: Not on file   Number of children: Not on file   Years of education: Not on file   Highest education level: Not on file  Occupational History   Not on file  Tobacco Use   Smoking status: Former    Types: Cigarettes    Quit date: 10/23/2019    Years since quitting: 1.3   Smokeless tobacco: Never  Vaping Use   Vaping Use: Never used  Substance and Sexual Activity   Alcohol use: Not Currently    Comment: QUIT  IN MAY [id number]   Drug use: No   Sexual  activity: Not on file  Other Topics Concern   Not on file  Social History Narrative   Not on file   Social Determinants of Health   Financial Resource Strain: Not on file  Food Insecurity: Not on file  Transportation Needs: Not on file  Physical Activity: Not on file  Stress: Not on file  Social Connections: Not on file   Past Surgical History:  Procedure Laterality Date   APPLICATION OF WOUND VAC  10/25/2019   Procedure: Application Of Wound Vac;  Surgeon: [name], MD;  Location: [place];  Service: Open Heart Surgery;;   CANNULATION FOR ECMO (EXTRACORPOREAL MEMBRANE OXYGENATION)  10/25/2019   Procedure: Cannulation For Ecmo (Extracorporeal Membrane Oxygenation) @ 1531;  Surgeon: Prescott Gum, Collier Salina, MD;  Location: [place];  Service: Open Heart Surgery;;   CANNULATION FOR ECMO (EXTRACORPOREAL MEMBRANE OXYGENATION) N/A 10/30/2019   Procedure: DECANNULATION OF ECMO (EXTRACORPOREAL MEMBRANE OXYGENATION);  Surgeon: Prescott Gum, Collier Salina, MD;  Location: [place];  Service: Open Heart Surgery;  Laterality: N/A;  Pump standby   CHEST EXPLORATION  10/25/2019   Procedure: Chest Exploration for repair of coronary perforation (repair of LAD);  Surgeon: Prescott Gum, Collier Salina, MD;  Location: [place];  Service: Open Heart Surgery;;   CORONARY CTO INTERVENTION N/A 10/25/2019  Procedure: CORONARY CTO INTERVENTION;  Surgeon: Martinique, Peter M, MD;  Location: [place] [facility];  Service: Cardiovascular;  Laterality: N/A;   CORONARY STENT INTERVENTION N/A 10/17/2019   Procedure: CORONARY STENT INTERVENTION;  Surgeon: Sherren Mocha, MD;  Location: [place] [facility];  Service: Cardiovascular;  Laterality: N/A;   LEFT HEART CATH AND CORONARY ANGIOGRAPHY N/A 10/17/2019   Procedure: LEFT HEART CATH AND CORONARY ANGIOGRAPHY;  Surgeon: Sherren Mocha, MD;  Location: [place] [facility];  Service: Cardiovascular;  Laterality: N/A;   [place]  10/25/2019   Procedure: Median Sternotomy.;  Surgeon: [name], MD;  Location:  [facility] OR;  Service: Open Heart Surgery;;   PLACEMENT OF IMPELLA LEFT VENTRICULAR ASSIST DEVICE  10/30/2019   Procedure: Placement Of Impella Left Ventricular Assist Device 5.5 USING 10MM HEMASHIELD PLATINUM GRAFT;  Surgeon: [name], MD;  Location: [place];  Service: Open Heart Surgery;;  graft placed into aorta   REMOVAL OF IMPELLA LEFT VENTRICULAR ASSIST DEVICE N/A 11/03/2019   Procedure: REMOVAL OF IMPELLA 5.5 LEFT VENTRICULAR ASSIST DEVICE;  Surgeon: [name], MD;  Location: [place];  Service: Open Heart Surgery;  Laterality: N/A;   STERNAL CLOSURE N/A 10/30/2019   Procedure: Sternal Closure;  Surgeon: [name], MD;  Location: [place];  Service: Open Heart Surgery;  Laterality: N/A;   STERNAL WIRES REMOVAL N/A 05/14/2020   Procedure: STERNAL WIRES REMOVAL;  Surgeon: [name], MD;  Location: [place];  Service: Thoracic;  Laterality: N/A;   TEE WITHOUT CARDIOVERSION N/A 10/25/2019   Procedure: TRANSESOPHAGEAL ECHOCARDIOGRAM (TEE);  Surgeon: Prescott Gum, Collier Salina, MD;  Location: [place];  Service: Open Heart Surgery;  Laterality: N/A;   TEE WITHOUT CARDIOVERSION N/A 10/30/2019   Procedure: TRANSESOPHAGEAL ECHOCARDIOGRAM (TEE);  Surgeon: Prescott Gum, Collier Salina, MD;  Location: [place];  Service: Open Heart Surgery;  Laterality: N/A;   TEE WITHOUT CARDIOVERSION N/A 11/03/2019   Procedure: TRANSESOPHAGEAL ECHOCARDIOGRAM (TEE);  Surgeon: Prescott Gum, Collier Salina, MD;  Location: [place];  Service: Open Heart Surgery;  Laterality: N/A;   Past Surgical History:  Procedure Laterality Date   APPLICATION OF WOUND VAC  10/25/2019   Procedure: Application Of Wound Vac;  Surgeon: [name], MD;  Location: [place];  Service: Open Heart Surgery;;   CANNULATION FOR ECMO (EXTRACORPOREAL MEMBRANE OXYGENATION)  10/25/2019   Procedure: Cannulation For Ecmo (Extracorporeal Membrane Oxygenation) @ 1607;  Surgeon: [name], MD;  Location: [place];  Service: Open Heart Surgery;;   CANNULATION FOR ECMO (EXTRACORPOREAL  MEMBRANE OXYGENATION) N/A 10/30/2019   Procedure: DECANNULATION OF ECMO (EXTRACORPOREAL MEMBRANE OXYGENATION);  Surgeon: Prescott Gum, Collier Salina, MD;  Location: [place];  Service: Open Heart Surgery;  Laterality: N/A;  Pump standby   CHEST EXPLORATION  10/25/2019   Procedure: Chest Exploration for repair of coronary perforation (repair of LAD);  Surgeon: Prescott Gum, Collier Salina, MD;  Location: [place];  Service: Open Heart Surgery;;   CORONARY CTO INTERVENTION N/A 10/25/2019   Procedure: CORONARY CTO INTERVENTION;  Surgeon: Martinique, Peter M, MD;  Location: [place] [facility];  Service: Cardiovascular;  Laterality: N/A;   CORONARY STENT INTERVENTION N/A 10/17/2019   Procedure: CORONARY STENT INTERVENTION;  Surgeon: Sherren Mocha, MD;  Location: [place] [facility];  Service: Cardiovascular;  Laterality: N/A;   LEFT HEART CATH AND CORONARY ANGIOGRAPHY N/A 10/17/2019   Procedure: LEFT HEART CATH AND CORONARY ANGIOGRAPHY;  Surgeon: Sherren Mocha, MD;  Location: [place] [facility];  Service: Cardiovascular;  Laterality: N/A;  MEDIASTERNOTOMY  10/25/2019   Procedure: Median Sternotomy.;  Surgeon: [name], MD;  Location: [facility];  Service: Open Heart Surgery;;   PLACEMENT OF IMPELLA LEFT VENTRICULAR ASSIST DEVICE  10/30/2019   Procedure: Placement Of Impella Left Ventricular Assist Device 5.5 USING 10MM HEMASHIELD PLATINUM GRAFT;  Surgeon: [name], MD;  Location: [place];  Service: Open Heart Surgery;;  graft placed into aorta   REMOVAL OF IMPELLA LEFT VENTRICULAR ASSIST DEVICE N/A 11/03/2019   Procedure: REMOVAL OF IMPELLA 5.5 LEFT VENTRICULAR ASSIST DEVICE;  Surgeon: [name], MD;  Location: [place];  Service: Open Heart Surgery;  Laterality: N/A;   STERNAL CLOSURE N/A 10/30/2019   Procedure: Sternal Closure;  Surgeon: [name], MD;  Location: [place];  Service: Open Heart Surgery;  Laterality: N/A;   STERNAL WIRES REMOVAL N/A 05/14/2020   Procedure: STERNAL WIRES REMOVAL;  Surgeon: [name], MD;   Location: [place];  Service: Thoracic;  Laterality: N/A;   TEE WITHOUT CARDIOVERSION N/A 10/25/2019   Procedure: TRANSESOPHAGEAL ECHOCARDIOGRAM (TEE);  Surgeon: Prescott Gum, Collier Salina, MD;  Location: [place];  Service: Open Heart Surgery;  Laterality: N/A;   TEE WITHOUT CARDIOVERSION N/A 10/30/2019   Procedure: TRANSESOPHAGEAL ECHOCARDIOGRAM (TEE);  Surgeon: Prescott Gum, Collier Salina, MD;  Location: [place];  Service: Open Heart Surgery;  Laterality: N/A;   TEE WITHOUT CARDIOVERSION N/A 11/03/2019   Procedure: TRANSESOPHAGEAL ECHOCARDIOGRAM (TEE);  Surgeon: Prescott Gum, Collier Salina, MD;  Location: [place];  Service: Open Heart Surgery;  Laterality: N/A;   Past Medical History:  Diagnosis Date   Alcohol abuse    Anxiety    CAD (coronary artery disease)    Cardiac arrest ([place])    Cardiogenic shock (HCC)    CHF (congestive heart failure) (HCC)    Cough    Dysrhythmia    GERD (gastroesophageal reflux disease)    HFrEF (heart failure with reduced ejection fraction) (HCC)    Hypertension    Hypertriglyceridemia    Ischemic cardiomyopathy    Right atrial thrombus    Tobacco abuse    BP 104/65   Pulse 76   Temp 98.5 F (36.9 C)   Ht 5' 8"  (1.727 m)   Wt 186 lb 9.6 oz (84.6 kg)   SpO2 97%   BMI 28.37 kg/m   Opioid Risk Score:   Fall Risk Score:  `1  Depression screen PHQ 2/9  Depression screen [facility] 2/9 03/10/2021 11/22/2020 10/25/2020 09/25/2020 09/03/2020 08/28/2020 06/28/2020  Decreased Interest 0 0 0 0 0 0 0  Down, Depressed, Hopeless 0 0 0 0 0 0 0  PHQ - 2 Score 0 0 0 0 0 0 0  Altered sleeping - - - - - - -  Tired, decreased energy - - - - - - -  Change in appetite - - - - - - -  Feeling bad or failure about yourself  - - - - - - -  Trouble concentrating - - - - - - -  Moving slowly or fidgety/restless - - - - - - -  Suicidal thoughts - - - - - - -  PHQ-9 Score - - - - - - -  Difficult doing work/chores - - - - - - -     Review of Systems  Constitutional: Negative.   HENT: Negative.    Eyes: Negative.    Respiratory: Negative.    Cardiovascular: Negative.   Gastrointestinal: Negative.   Endocrine: Negative.   Genitourinary: Negative.  Musculoskeletal:  Positive for arthralgias and back pain.  Skin: Negative.   Allergic/Immunologic: Negative.   Neurological: Negative.   Hematological:  Bruises/bleeds easily.       Plavix  All other systems reviewed and are negative.     Objective:   Physical Exam Vitals and nursing note reviewed.  Constitutional:      Appearance: Normal appearance.  Cardiovascular:     Rate and Rhythm: Normal rate and regular rhythm.     Pulses: Normal pulses.     Heart sounds: Normal heart sounds.  Pulmonary:     Effort: Pulmonary effort is normal.     Breath sounds: Normal breath sounds.  Musculoskeletal:     Cervical back: Normal range of motion and neck supple.     Comments: Normal Muscle Bulk and Muscle Testing Reveals:  Upper Extremities: Full ROM and Muscle Strength  5/5 Bilateral AC Joint Tenderness L>R Lumbar Paraspinal Tenderness: L-3-L-5 Lower Extremities: Full ROM and Muscle Strength 5/5 Arises from chair with ease Narrow Based  Gait     Skin:    General: Skin is warm and dry.  Neurological:     Mental Status: He is alert and oriented to person, place, and time.  Psychiatric:        Mood and Affect: Mood normal.        Behavior: Behavior normal.         Assessment & Plan:  1. Left Shoulder Pain: No complaints today. Continue HEP as Tolerated. Continue current medication regimen. Continue to monitor. 03/10/2021 2.Left Greater Trochanter Bursitis: No complaints today. S/P Left Hip Injection with Dr Ranell Patrick on 09/03/2020, no relief noted he reports, Continue to alternate with Heat an Ice Therapy. Continue to monitor. 03/10/2021 3. Bilateral  Knees OA: Continue HEP as Tolerated. Continue current medication regimen. 03/10/2021 3. Chest Wall Discomfort/ Neuropathic Pain:  Continue Gabapentin. Continue to Monitor. 03/10/2021 4.Chronic Low  Back Pain without sciatica: Continue HEP and Continue current medication regimen. Continue to monitor.  4. Chronic Pain Syndrome: : Refilled: Oxycodone 10/325 mg one tablet every 4 hours  as needed for pain #150. We will continue the opioid monitoring program, this consists of regular clinic visits, examinations, urine drug screen, pill counts as well as use of [place] Controlled Substance Reporting system. A 12 month History has been reviewed on the [place] Controlled Substance Reporting System on 03/10/2021.   F/U in 1 month

## 2021-03-14 ENCOUNTER — Telehealth: Payer: Self-pay | Admitting: Medical

## 2021-03-14 DIAGNOSIS — I255 Ischemic cardiomyopathy: Secondary | ICD-10-CM

## 2021-03-14 DIAGNOSIS — I5022 Chronic systolic (congestive) heart failure: Secondary | ICD-10-CM

## 2021-03-14 MED ORDER — CARVEDILOL 3.125 MG PO TABS: ORAL_TABLET | ORAL | 3 refills | Status: DC

## 2021-03-14 NOTE — Telephone Encounter (Signed)
   Patient requesting refill of carvedilol. Rx sent to preferred pharmacy. Patient appreciative of the call.  Abigail Butts, PA-C 03/14/21; 6:27 PM

## 2021-03-28 ENCOUNTER — Encounter
Payer: BLUE CROSS/BLUE SHIELD | Attending: Physical Medicine and Rehabilitation | Admitting: Physical Medicine and Rehabilitation

## 2021-03-28 ENCOUNTER — Encounter: Payer: Self-pay | Admitting: Physical Medicine and Rehabilitation

## 2021-03-28 ENCOUNTER — Other Ambulatory Visit: Payer: Self-pay

## 2021-03-28 VITALS — BP 134/89 | HR 96 | Temp 98.4°F | Ht 68.0 in | Wt 180.0 lb

## 2021-03-28 DIAGNOSIS — M9420 Chondromalacia, unspecified site: Secondary | ICD-10-CM | POA: Insufficient documentation

## 2021-03-28 DIAGNOSIS — Z125 Encounter for screening for malignant neoplasm of prostate: Secondary | ICD-10-CM | POA: Diagnosis present

## 2021-03-28 DIAGNOSIS — G8929 Other chronic pain: Secondary | ICD-10-CM | POA: Diagnosis present

## 2021-03-28 DIAGNOSIS — E349 Endocrine disorder, unspecified: Secondary | ICD-10-CM | POA: Insufficient documentation

## 2021-03-28 DIAGNOSIS — G894 Chronic pain syndrome: Secondary | ICD-10-CM | POA: Insufficient documentation

## 2021-03-28 DIAGNOSIS — M545 Low back pain, unspecified: Secondary | ICD-10-CM | POA: Diagnosis present

## 2021-03-28 DIAGNOSIS — R0789 Other chest pain: Secondary | ICD-10-CM | POA: Diagnosis not present

## 2021-03-28 MED ORDER — OXYCODONE-ACETAMINOPHEN 10-325 MG PO TABS: 1.0000 | ORAL_TABLET | ORAL | 0 refills | Status: DC | PRN

## 2021-03-28 NOTE — Progress Notes (Signed)
Subjective:    Patient ID: William Crawford, male    DOB: 1971-05-19, 50 y.o.   MRN: [id number]  HPI:     William Crawford is a 50 y.o. male who presents for follow-up of worsening low back pain, bilateral knee pain, chest pain, and neck pain.   -currently pain is worse in both knees and his low back. -The knee pain is located just under the knee cap and extends medially to laterally -He would like to try Qutenza patch today -He was unable to work today due to pain in both back and knees -Also has chest pain at times- sharp and lasts three minutes when he has it.  -has not yet heard from ortho regarding referral for knees.  -Pain has grown excruciating but he has been able to return to work as he needs to financially. -Discussed results of his bilateral knee MRI, his neck XR, and his back XR -He continues to have chest pain and had recent follow-up with cardiology. Would still like to try the Qutenza patch -he has never tried Cymbalta and would be willing to -he requests if oxycodone can be increased to 6 tablets per day.  -does not note radiation of pain into legs, but legs have given out dur to pain causing near fall -he does not note loss of bowel or bladder function -does note constipation -also having shooting pain down both arms into pinky fingers   Prior history: He states his pain is located in his left shoulder, lower back pain, left hip and bilateral knee pain. He was in contact with Dr Ranell Patrick regarding his increase intensity of pain, per Dr Ranell Patrick note his oxycodone will be increased to 5 times a day as needed for pain. William Crawford understanding.  He rates his pain 4. His current exercise regime is walking and performing stretching exercises.  William Crawford is 60.00  MME.   Last UDs was Performed on 08/28/2020, it was consistent.   He is having a good summer.   He fell down and hit his right ankle and got a big bruise- not hurting too bad.    He had a good time with his grandson riding rolling coasters at [employer]  His chest pain returned and it feels sore and he plans to f/u with his surgeon regarding the pain. It feels sharp, hot, like a burning He takes Gabapentin 666m- but that doesn't help unless used with the oxycodone.  Currently his pain is worse in bilateral knees- it is worst when he kneels, climbs ladders. He has a supportive community at work which allow him to limit his kneeling and he has been using pads when he kneels. His oxycodone is now only lasting him about 2.5 hours after he takes it and he has been taking them 5 times per day. He asks if there is anything stronger he can take. He has tried Voltaren gel in the past and its effect usually started an hour after application.      Pain Inventory Average Pain 8 Pain Right Now 7 My pain is sharp, burning, stabbing, tingling, and aching  In the last 24 hours, has pain interfered with the following? General activity 10 Relation with others 10 Enjoyment of life 10 What TIME of day is your pain at its worst? morning  and daytime Sleep (in general) Fair  Pain is worse with: walking and bending Pain improves with: rest and medication Relief from Meds: 9  Family  History  Problem Relation Age of Onset   Heart attack Father    Heart disease Father    Heart attack Paternal Grandfather    Heart disease Paternal Grandfather    Heart attack Paternal Uncle    Heart disease Paternal Uncle    Heart attack Paternal Uncle    Heart disease Paternal Uncle    Social History   Socioeconomic History   Marital status: Single    Spouse name: Not on file   Number of children: Not on file   Years of education: Not on file   Highest education level: Not on file  Occupational History   Not on file  Tobacco Use   Smoking status: Former    Types: Cigarettes    Quit date: 10/23/2019    Years since quitting: 1.4   Smokeless tobacco: Never  Vaping Use   Vaping Use:  Never used  Substance and Sexual Activity   Alcohol use: Not Currently    Comment: QUIT  IN MAY [id number]   Drug use: No   Sexual activity: Not on file  Other Topics Concern   Not on file  Social History Narrative   Not on file   Social Determinants of Health   Financial Resource Strain: Not on file  Food Insecurity: Not on file  Transportation Needs: Not on file  Physical Activity: Not on file  Stress: Not on file  Social Connections: Not on file   Past Surgical History:  Procedure Laterality Date   APPLICATION OF WOUND VAC  10/25/2019   Procedure: Application Of Wound Vac;  Surgeon: [name], MD;  Location: [place];  Service: Open Heart Surgery;;   CANNULATION FOR ECMO (EXTRACORPOREAL MEMBRANE OXYGENATION)  10/25/2019   Procedure: Cannulation For Ecmo (Extracorporeal Membrane Oxygenation) @ 1531;  Surgeon: Prescott Gum, Collier Salina, MD;  Location: [place];  Service: Open Heart Surgery;;   CANNULATION FOR ECMO (EXTRACORPOREAL MEMBRANE OXYGENATION) N/A 10/30/2019   Procedure: DECANNULATION OF ECMO (EXTRACORPOREAL MEMBRANE OXYGENATION);  Surgeon: Prescott Gum, Collier Salina, MD;  Location: [place];  Service: Open Heart Surgery;  Laterality: N/A;  Pump standby   CHEST EXPLORATION  10/25/2019   Procedure: Chest Exploration for repair of coronary perforation (repair of LAD);  Surgeon: Prescott Gum, Collier Salina, MD;  Location: [place];  Service: Open Heart Surgery;;   CORONARY CTO INTERVENTION N/A 10/25/2019   Procedure: CORONARY CTO INTERVENTION;  Surgeon: Martinique, Peter M, MD;  Location: [place] [facility];  Service: Cardiovascular;  Laterality: N/A;   CORONARY STENT INTERVENTION N/A 10/17/2019   Procedure: CORONARY STENT INTERVENTION;  Surgeon: Sherren Mocha, MD;  Location: [place] [facility];  Service: Cardiovascular;  Laterality: N/A;   LEFT HEART CATH AND CORONARY ANGIOGRAPHY N/A 10/17/2019   Procedure: LEFT HEART CATH AND CORONARY ANGIOGRAPHY;  Surgeon: Sherren Mocha, MD;  Location: [place] [facility];  Service:  Cardiovascular;  Laterality: N/A;   [place]  10/25/2019   Procedure: Median Sternotomy.;  Surgeon: [name], MD;  Location: [facility] OR;  Service: Open Heart Surgery;;   PLACEMENT OF IMPELLA LEFT VENTRICULAR ASSIST DEVICE  10/30/2019   Procedure: Placement Of Impella Left Ventricular Assist Device 5.5 USING 10MM HEMASHIELD PLATINUM GRAFT;  Surgeon: [name], MD;  Location: [place];  Service: Open Heart Surgery;;  graft placed into aorta   REMOVAL OF IMPELLA LEFT VENTRICULAR ASSIST DEVICE N/A 11/03/2019   Procedure: REMOVAL OF IMPELLA 5.5 LEFT VENTRICULAR ASSIST DEVICE;  Surgeon: [name], MD;  Location: [place];  Service: Open Heart Surgery;  Laterality: N/A;   STERNAL CLOSURE N/A 10/30/2019   Procedure: Sternal Closure;  Surgeon: [name], MD;  Location: [place];  Service: Open Heart Surgery;  Laterality: N/A;   STERNAL WIRES REMOVAL N/A 05/14/2020   Procedure: STERNAL WIRES REMOVAL;  Surgeon: [name], MD;  Location: [place];  Service: Thoracic;  Laterality: N/A;   TEE WITHOUT CARDIOVERSION N/A 10/25/2019   Procedure: TRANSESOPHAGEAL ECHOCARDIOGRAM (TEE);  Surgeon: Prescott Gum, Collier Salina, MD;  Location: [place];  Service: Open Heart Surgery;  Laterality: N/A;   TEE WITHOUT CARDIOVERSION N/A 10/30/2019   Procedure: TRANSESOPHAGEAL ECHOCARDIOGRAM (TEE);  Surgeon: Prescott Gum, Collier Salina, MD;  Location: [place];  Service: Open Heart Surgery;  Laterality: N/A;   TEE WITHOUT CARDIOVERSION N/A 11/03/2019   Procedure: TRANSESOPHAGEAL ECHOCARDIOGRAM (TEE);  Surgeon: Prescott Gum, Collier Salina, MD;  Location: [place];  Service: Open Heart Surgery;  Laterality: N/A;   Past Surgical History:  Procedure Laterality Date   APPLICATION OF WOUND VAC  10/25/2019   Procedure: Application Of Wound Vac;  Surgeon: [name], MD;  Location: [place];  Service: Open Heart Surgery;;   CANNULATION FOR ECMO (EXTRACORPOREAL MEMBRANE OXYGENATION)  10/25/2019   Procedure: Cannulation For Ecmo (Extracorporeal Membrane  Oxygenation) @ 1610;  Surgeon: [name], MD;  Location: [place];  Service: Open Heart Surgery;;   CANNULATION FOR ECMO (EXTRACORPOREAL MEMBRANE OXYGENATION) N/A 10/30/2019   Procedure: DECANNULATION OF ECMO (EXTRACORPOREAL MEMBRANE OXYGENATION);  Surgeon: Prescott Gum, Collier Salina, MD;  Location: [place];  Service: Open Heart Surgery;  Laterality: N/A;  Pump standby   CHEST EXPLORATION  10/25/2019   Procedure: Chest Exploration for repair of coronary perforation (repair of LAD);  Surgeon: Prescott Gum, Collier Salina, MD;  Location: [place];  Service: Open Heart Surgery;;   CORONARY CTO INTERVENTION N/A 10/25/2019   Procedure: CORONARY CTO INTERVENTION;  Surgeon: Martinique, Peter M, MD;  Location: [place] [facility];  Service: Cardiovascular;  Laterality: N/A;   CORONARY STENT INTERVENTION N/A 10/17/2019   Procedure: CORONARY STENT INTERVENTION;  Surgeon: Sherren Mocha, MD;  Location: [place] [facility];  Service: Cardiovascular;  Laterality: N/A;   LEFT HEART CATH AND CORONARY ANGIOGRAPHY N/A 10/17/2019   Procedure: LEFT HEART CATH AND CORONARY ANGIOGRAPHY;  Surgeon: Sherren Mocha, MD;  Location: [place] [facility];  Service: Cardiovascular;  Laterality: N/A;   [place]  10/25/2019   Procedure: Median Sternotomy.;  Surgeon: [name], MD;  Location: [facility] OR;  Service: Open Heart Surgery;;   PLACEMENT OF IMPELLA LEFT VENTRICULAR ASSIST DEVICE  10/30/2019   Procedure: Placement Of Impella Left Ventricular Assist Device 5.5 USING 10MM HEMASHIELD PLATINUM GRAFT;  Surgeon: [name], MD;  Location: [place];  Service: Open Heart Surgery;;  graft placed into aorta   REMOVAL OF IMPELLA LEFT VENTRICULAR ASSIST DEVICE N/A 11/03/2019   Procedure: REMOVAL OF IMPELLA 5.5 LEFT VENTRICULAR ASSIST DEVICE;  Surgeon: [name], MD;  Location: [place];  Service: Open Heart Surgery;  Laterality: N/A;   STERNAL CLOSURE N/A 10/30/2019   Procedure: Sternal Closure;  Surgeon: [name], MD;  Location: [place];  Service: Open  Heart Surgery;  Laterality: N/A;   STERNAL WIRES REMOVAL N/A 05/14/2020   Procedure: STERNAL WIRES REMOVAL;  Surgeon: [name], MD;  Location: [place];  Service: Thoracic;  Laterality: N/A;   TEE WITHOUT CARDIOVERSION N/A 10/25/2019   Procedure: TRANSESOPHAGEAL ECHOCARDIOGRAM (TEE);  Surgeon: Prescott Gum, Collier Salina, MD;  Location: [place];  Service: Open Heart Surgery;  Laterality: N/A;   TEE WITHOUT CARDIOVERSION N/A 10/30/2019   Procedure: TRANSESOPHAGEAL ECHOCARDIOGRAM (TEE);  Surgeon: Prescott Gum, Collier Salina, MD;  Location: [place];  Service: Open Heart Surgery;  Laterality: N/A;   TEE WITHOUT CARDIOVERSION N/A 11/03/2019   Procedure: TRANSESOPHAGEAL ECHOCARDIOGRAM (TEE);  Surgeon: Prescott Gum, Collier Salina, MD;  Location: [place];  Service: Open Heart Surgery;  Laterality: N/A;   Past Medical History:  Diagnosis Date   Alcohol abuse    Anxiety    CAD (coronary artery disease)    Cardiac arrest ([place])    Cardiogenic shock (HCC)    CHF (congestive heart failure) (HCC)    Cough    Dysrhythmia    GERD (gastroesophageal reflux disease)    HFrEF (heart failure with reduced ejection fraction) (HCC)    Hypertension    Hypertriglyceridemia    Ischemic cardiomyopathy    Right atrial thrombus    Tobacco abuse    There were no vitals taken for this visit.  Opioid Risk Score:   Fall Risk Score:  `1  Depression screen PHQ 2/9  Depression screen [facility] 2/9 03/10/2021 11/22/2020 10/25/2020 09/25/2020 09/03/2020 08/28/2020 06/28/2020  Decreased Interest 0 0 0 0 0 0 0  Down, Depressed, Hopeless 0 0 0 0 0 0 0  PHQ - 2 Score 0 0 0 0 0 0 0  Altered sleeping - - - - - - -  Tired, decreased energy - - - - - - -  Change in appetite - - - - - - -  Feeling bad or failure about yourself  - - - - - - -  Trouble concentrating - - - - - - -  Moving slowly or fidgety/restless - - - - - - -  Suicidal thoughts - - - - - - -  PHQ-9 Score - - - - - - -  Difficult doing work/chores - - - - - - -     Review of Systems  Constitutional:  Negative.   HENT: Negative.    Eyes: Negative.   Respiratory: Negative.    Cardiovascular: Negative.   Gastrointestinal: Negative.   Endocrine: Negative.   Genitourinary: Negative.   Musculoskeletal:  Positive for arthralgias, back pain and myalgias.  Skin: Negative.   Allergic/Immunologic: Negative.   Neurological:        Tingling  Hematological:  Bruises/bleeds easily.       Plavix  Psychiatric/Behavioral: Negative.    All other systems reviewed and are negative.     Objective:    Gen: no distress, normal appearing HEENT: oral mucosa pink and moist, NCAT Cardio: Reg rate Chest: normal effort, normal rate of breathing Abd: soft, non-distended Ext: no edema Psych: pleasant, normal affect Skin: intact Neuro: Alert and oriented x3 Musculoskeletal: TTP below both knee caps, lower lumbar spine centrally      Assessment & Plan:  1. Left Shoulder Pain: Continue HEP as Tolerated. Continue current medication regimen. Continue to monitor. 11/22/2020  2.Left Greater Trochanter Bursitis: S/P Left Hip Injection with Dr Ranell Patrick on 09/03/2020, no relief noted he reports, Continue to alternate with Heat an Ice Therapy. Continue to monitor. 11/22/2020  3. Bilateral  Knees chondral defects: Continue HEP as Tolerated. Continue current medication regimen. 11/22/2020. Discussed results of bilateral knee MRIs which showed chondral deficits. Advised application of voltaren gel. Advised icing in evenings. Did not benefit from prior steroid taper. Referred to ortho for eval for arthroscopic debridement.   3. Chest Wall Discomfort/ Neuropathic Pain:  Continue Gabapentin. Continue to Monitor. 11/22/2020. Prescribed lidocaine patch.  -Cymbalta 66m daily prescribed -Discussed Qutenza as an option for neuropathic pain control. Discussed that this is a capsaicin patch, stronger than capsaicin cream. Discussed that it is currently approved for diabetic peripheral neuropathy and post-herpetic neuralgia,  but that it has also shown benefit in treating other forms of neuropathy. Provided patient with link to site to learn more about the patch: hCinemaBonus.fr Discussed that the patch would be placed in office and benefits usually last 3 months. Discussed that unintended exposure to capsaicin can cause severe irritation of eyes, mucous membranes, respiratory tract, and skin, but that Qutenza is a local treatment and does not have the systemic side effects of other nerve medications. Discussed that there may be pain, itching, erythema, and decreased sensory function associated with the application of Qutenza. Side effects usually subside within 1 week. A cold pack of analgesic medications can help with these side effects. Blood pressure can also be increased due to pain associated with administration of the patch.  2 patches of Qutenza was applied to the areas of pain. Ice packs were applied during the procedure to ensure patient comfort. Blood pressure was monitored every 15 minutes. The patient tolerated the procedure well. Post-procedure instructions were given and follow-up has been scheduled.    -4. Chronic Pain Syndrome: :  Continue Oxycodone 10/325 mg 6 times a day as needed for pain #180. We will continue the opioid monitoring program, this consists of regular clinic visits, examinations, urine drug screen, pill [place] A 12 month [place] 11[place]22/2020. Discussed risks and benefits of opioid medications.  -Provided with a pain relief journal and discussed that it contains foods and lifestyle tips to naturally help to improve pain. Discussed that these lifestyle strategies are also very good for health unlike some medications which can have negative side effects. Discussed that the act of keeping a journal can be therapeutic and helpful to realize  patterns what helps to trigger and alleviate pain.   -Discussed following foods that may reduce pain: 1) Ginger (especially studied for arthritis)- reduce leukotriene production to decrease inflammation 2) Blueberries- high in phytonutrients that decrease inflammation 3) Salmon- marine omega-3s reduce joint swelling and pain 4) Pumpkin seeds- reduce inflammation 5) dark chocolate- reduces inflammation 6) turmeric- reduces inflammation 7) tart cherries - reduce pain and stiffness 8) extra virgin olive oil - its compound olecanthal helps to block prostaglandins  9) chili peppers- can be eaten or applied topically via capsaicin 10) mint- helpful for headache, muscle aches, joint pain, and itching 11) garlic- reduces inflammation  Link to further information on diet for chronic pain: hhttp://www.randall.com/  5) Insomnia: prescribed trazodone 560mHS  6) Low back pain: -Discussed results of MRI which shows disc degeneration -discussed that muscle and ligament pathology can also cause severe pain at times -encouraged continued exercise and working to send blood flow to injured area and promote healing  7) Cervical radiuculitis: -Discussed that cervical XR showed disc degeneration   8 minutes spent in discussion of patient's bilateral knee MRIs, lumbar and cervical spine XRs, discussion of increase in oxycodone to 6 tablets per day, trial of Qutenza for neuropathic chest pain, encouragement of continued movement and exercise

## 2021-03-28 NOTE — Addendum Note (Signed)
Addended by: [name] on: 03/28/2021 04:11 PM   Modules accepted: Orders

## 2021-03-28 NOTE — Addendum Note (Signed)
Addended by: [name] on: 03/28/2021 04:08 PM   Modules accepted: Orders

## 2021-03-29 LAB — MAGNESIUM: Magnesium: 2.1 mg/dL (ref 1.6–2.3)

## 2021-03-29 LAB — TESTOSTERONE: Testosterone: 276 ng/dL (ref 264–916)

## 2021-03-29 LAB — VITAMIN D 25 HYDROXY (VIT D DEFICIENCY, FRACTURES): Vit D, 25-Hydroxy: 53.5 ng/mL (ref 30.0–100.0)

## 2021-04-01 ENCOUNTER — Other Ambulatory Visit: Payer: Self-pay

## 2021-04-01 ENCOUNTER — Encounter (HOSPITAL_BASED_OUTPATIENT_CLINIC_OR_DEPARTMENT_OTHER): Payer: BLUE CROSS/BLUE SHIELD | Admitting: Physical Medicine and Rehabilitation

## 2021-04-01 DIAGNOSIS — Z125 Encounter for screening for malignant neoplasm of prostate: Secondary | ICD-10-CM | POA: Diagnosis not present

## 2021-04-01 NOTE — Progress Notes (Signed)
Subjective:    Patient ID: William Crawford, male    DOB: June 13, 1970, 50 y.o.   MRN: [id number]  HPI: An audio/video tele-health visit is felt to be the most appropriate encounter for this patient at this time. This is a follow up tele-visit via phone. The patient is at home. MD is at office.        William Crawford is a 50 y.o. male who presents for follow-up of worsening low back pain, bilateral knee pain, chest pain, and neck pain.   -currently pain is worse in both knees and his low back. -The knee pain is located just under the knee cap and extends medially to laterally -He would like to try Qutenza patch today -He was unable to work today due to pain in both back and knees -Also has chest pain at times- sharp and lasts three minutes when he has it.  -Pain has grown excruciating but he has been able to return to work as he needs to financially. -Discussed results of his bilateral knee MRI, his neck XR, and his back XR -He continues to have chest pain and had recent follow-up with cardiology. Would still like to try the Qutenza patch -he has never tried Cymbalta and would be willing to -he requests if oxycodone can be increased to 6 tablets per day.  -does not note radiation of pain into legs, but legs have given out dur to pain causing near fall -he does not note loss of bowel or bladder function -does note constipation -also having shooting pain down both arms into pinky fingers -discussed that bilateral knee pain and back pain could potentially be related to opioid-induced testosterone deficiency and he is agreeable to checking testosterone levels -he is also interested in ortho follow-up for his knee pain -he is feeling better today and was able to go to work -had burning pain over the weekend from the [place] that is now gone. Forgot to use the cooling gel. -he has never had PSA checked  Prior history: He states his pain is located in his left shoulder, lower back pain, left  hip and bilateral knee pain. He was in contact with Dr Ranell Patrick regarding his increase intensity of pain, per Dr Ranell Patrick note his oxycodone will be increased to 5 times a day as needed for pain. William Crawford understanding.  He rates his pain 4. His current exercise regime is walking and performing stretching exercises.  Mr. Sondger[employer] Morphine equivalent is 60.00  MME.   Last UDs was Performed on 08/28/2020, it was consistent.   He is having a good summer.   He fell down and hit his right ankle and got a big bruise- not hurting too bad.   He had a good time with his grandson riding rolling coasters at [employer]  His chest pain returned and it feels sore and he plans to f/u with his surgeon regarding the pain. It feels sharp, hot, like a burning He takes Gabapentin 672m- but that doesn't help unless used with the oxycodone.  Currently his pain is worse in bilateral knees- it is worst when he kneels, climbs ladders. He has a supportive community at work which allow him to limit his kneeling and he has been using pads when he kneels. His oxycodone is now only lasting him about 2.5 hours after he takes it and he has been taking them 5 times per day. He asks if there is anything stronger he can take. He has tried  Voltaren gel in the past and its effect usually started an hour after application.      Pain Inventory Average Pain 8 Pain Right Now 7 My pain is sharp, burning, stabbing, tingling, and aching  In the last 24 hours, has pain interfered with the following? General activity 10 Relation with others 10 Enjoyment of life 10 What TIME of day is your pain at its worst? morning  and daytime Sleep (in general) Fair  Pain is worse with: walking and bending Pain improves with: rest and medication Relief from Meds: 9  Family History  Problem Relation Age of Onset   Heart attack Father    Heart disease Father    Heart attack Paternal Grandfather    Heart disease Paternal  Grandfather    Heart attack Paternal Uncle    Heart disease Paternal Uncle    Heart attack Paternal Uncle    Heart disease Paternal Uncle    Social History   Socioeconomic History   Marital status: Single    Spouse name: Not on file   Number of children: Not on file   Years of education: Not on file   Highest education level: Not on file  Occupational History   Not on file  Tobacco Use   Smoking status: Former    Types: Cigarettes    Quit date: 10/23/2019    Years since quitting: 1.4   Smokeless tobacco: Never  Vaping Use   Vaping Use: Never used  Substance and Sexual Activity   Alcohol use: Not Currently    Comment: QUIT  IN MAY 32671   Drug use: No   Sexual activity: Not on file  Other Topics Concern   Not on file  Social History Narrative   Not on file   Social Determinants of Health   Financial Resource Strain: Not on file  Food Insecurity: Not on file  Transportation Needs: Not on file  Physical Activity: Not on file  Stress: Not on file  Social Connections: Not on file   Past Surgical History:  Procedure Laterality Date   APPLICATION OF WOUND VAC  10/25/2019   Procedure: Application Of Wound Vac;  Surgeon: [name], MD;  Location: [place];  Service: Open Heart Surgery;;   CANNULATION FOR ECMO (EXTRACORPOREAL MEMBRANE OXYGENATION)  10/25/2019   Procedure: Cannulation For Ecmo (Extracorporeal Membrane Oxygenation) @ 1531;  Surgeon: Prescott Gum, Collier Salina, MD;  Location: [place];  Service: Open Heart Surgery;;   CANNULATION FOR ECMO (EXTRACORPOREAL MEMBRANE OXYGENATION) N/A 10/30/2019   Procedure: DECANNULATION OF ECMO (EXTRACORPOREAL MEMBRANE OXYGENATION);  Surgeon: Prescott Gum, Collier Salina, MD;  Location: [place];  Service: Open Heart Surgery;  Laterality: N/A;  Pump standby   CHEST EXPLORATION  10/25/2019   Procedure: Chest Exploration for repair of coronary perforation (repair of LAD);  Surgeon: Prescott Gum, Collier Salina, MD;  Location: [place];  Service: Open Heart Surgery;;   CORONARY  CTO INTERVENTION N/A 10/25/2019   Procedure: CORONARY CTO INTERVENTION;  Surgeon: Martinique, Peter M, MD;  Location: [place] [facility];  Service: Cardiovascular;  Laterality: N/A;   CORONARY STENT INTERVENTION N/A 10/17/2019   Procedure: CORONARY STENT INTERVENTION;  Surgeon: Sherren Mocha, MD;  Location: [place] [facility];  Service: Cardiovascular;  Laterality: N/A;   LEFT HEART CATH AND CORONARY ANGIOGRAPHY N/A 10/17/2019   Procedure: LEFT HEART CATH AND CORONARY ANGIOGRAPHY;  Surgeon: Sherren Mocha, MD;  Location: [place] [facility];  Service: Cardiovascular;  Laterality: N/A;   MEDIASTERNOTOMY  10/25/2019  Procedure: Median Sternotomy.;  Surgeon: [name], MD;  [facility];  Service: Open Heart Surgery;;   PLACEMENT OF IMPELLA LEFT VENTRICULAR ASSIST DEVICE  10/30/2019   Procedure: Placement Of Impella Left Ventricular Assist Device 5.5 USING 10MM HEMASHIELD PLATINUM GRAFT;  Surgeon: [name], MD;  Location: [place];  Service: Open Heart Surgery;;  graft placed into aorta   REMOVAL OF IMPELLA LEFT VENTRICULAR ASSIST DEVICE N/A 11/03/2019   Procedure: REMOVAL OF IMPELLA 5.5 LEFT VENTRICULAR ASSIST DEVICE;  Surgeon: [name], MD;  Location: [place];  Service: Open Heart Surgery;  Laterality: N/A;   STERNAL CLOSURE N/A 10/30/2019   Procedure: Sternal Closure;  Surgeon: [name], MD;  Location: [place];  Service: Open Heart Surgery;  Laterality: N/A;   STERNAL WIRES REMOVAL N/A 05/14/2020   Procedure: STERNAL WIRES REMOVAL;  Surgeon: [name], MD;  Location: [place];  Service: Thoracic;  Laterality: N/A;   TEE WITHOUT CARDIOVERSION N/A 10/25/2019   Procedure: TRANSESOPHAGEAL ECHOCARDIOGRAM (TEE);  Surgeon: Prescott Gum, Collier Salina, MD;  Location: [place];  Service: Open Heart Surgery;  Laterality: N/A;   TEE WITHOUT CARDIOVERSION N/A 10/30/2019   Procedure: TRANSESOPHAGEAL ECHOCARDIOGRAM (TEE);  Surgeon: Prescott Gum, Collier Salina, MD;  Location: [place];  Service: Open Heart Surgery;   Laterality: N/A;   TEE WITHOUT CARDIOVERSION N/A 11/03/2019   Procedure: TRANSESOPHAGEAL ECHOCARDIOGRAM (TEE);  Surgeon: Prescott Gum, Collier Salina, MD;  Location: [place];  Service: Open Heart Surgery;  Laterality: N/A;   Past Surgical History:  Procedure Laterality Date   APPLICATION OF WOUND VAC  10/25/2019   Procedure: Application Of Wound Vac;  Surgeon: [name], MD;  Location: [place];  Service: Open Heart Surgery;;   CANNULATION FOR ECMO (EXTRACORPOREAL MEMBRANE OXYGENATION)  10/25/2019   Procedure: Cannulation For Ecmo (Extracorporeal Membrane Oxygenation) @ 5053;  Surgeon: [name], MD;  Location: [place];  Service: Open Heart Surgery;;   CANNULATION FOR ECMO (EXTRACORPOREAL MEMBRANE OXYGENATION) N/A 10/30/2019   Procedure: DECANNULATION OF ECMO (EXTRACORPOREAL MEMBRANE OXYGENATION);  Surgeon: Prescott Gum, Collier Salina, MD;  Location: [place];  Service: Open Heart Surgery;  Laterality: N/A;  Pump standby   CHEST EXPLORATION  10/25/2019   Procedure: Chest Exploration for repair of coronary perforation (repair of LAD);  Surgeon: Prescott Gum, Collier Salina, MD;  Location: [place];  Service: Open Heart Surgery;;   CORONARY CTO INTERVENTION N/A 10/25/2019   Procedure: CORONARY CTO INTERVENTION;  Surgeon: Martinique, Peter M, MD;  Location: [place] [facility];  Service: Cardiovascular;  Laterality: N/A;   CORONARY STENT INTERVENTION N/A 10/17/2019   Procedure: CORONARY STENT INTERVENTION;  Surgeon: Sherren Mocha, MD;  Location: [place] [facility];  Service: Cardiovascular;  Laterality: N/A;   LEFT HEART CATH AND CORONARY ANGIOGRAPHY N/A 10/17/2019   Procedure: LEFT HEART CATH AND CORONARY ANGIOGRAPHY;  Surgeon: Sherren Mocha, MD;  Location: [place] [facility];  Service: Cardiovascular;  Laterality: N/A;   [place]  10/25/2019   Procedure: Median Sternotomy.;  Surgeon: [name], MD;  Location: [facility];  Service: Open Heart Surgery;;   PLACEMENT OF IMPELLA LEFT VENTRICULAR ASSIST DEVICE  10/30/2019   Procedure:  Placement Of Impella Left Ventricular Assist Device 5.5 USING 10MM HEMASHIELD PLATINUM GRAFT;  Surgeon: [name], MD;  Location: [place];  Service: Open Heart Surgery;;  graft placed into aorta   REMOVAL OF IMPELLA LEFT VENTRICULAR ASSIST DEVICE N/A 11/03/2019   Procedure: REMOVAL OF IMPELLA 5.5 LEFT VENTRICULAR ASSIST DEVICE;  Surgeon:  [name], MD;  Location: [place];  Service: Open Heart Surgery;  Laterality: N/A;   STERNAL CLOSURE N/A 10/30/2019   Procedure: Sternal Closure;  Surgeon: [name], MD;  Location: [place];  Service: Open Heart Surgery;  Laterality: N/A;   STERNAL WIRES REMOVAL N/A 05/14/2020   Procedure: STERNAL WIRES REMOVAL;  Surgeon: [name], MD;  Location: [place];  Service: Thoracic;  Laterality: N/A;   TEE WITHOUT CARDIOVERSION N/A 10/25/2019   Procedure: TRANSESOPHAGEAL ECHOCARDIOGRAM (TEE);  Surgeon: Prescott Gum, Collier Salina, MD;  Location: [place];  Service: Open Heart Surgery;  Laterality: N/A;   TEE WITHOUT CARDIOVERSION N/A 10/30/2019   Procedure: TRANSESOPHAGEAL ECHOCARDIOGRAM (TEE);  Surgeon: Prescott Gum, Collier Salina, MD;  Location: [place];  Service: Open Heart Surgery;  Laterality: N/A;   TEE WITHOUT CARDIOVERSION N/A 11/03/2019   Procedure: TRANSESOPHAGEAL ECHOCARDIOGRAM (TEE);  Surgeon: Prescott Gum, Collier Salina, MD;  Location: [place];  Service: Open Heart Surgery;  Laterality: N/A;   Past Medical History:  Diagnosis Date   Alcohol abuse    Anxiety    CAD (coronary artery disease)    Cardiac arrest ([place])    Cardiogenic shock (HCC)    CHF (congestive heart failure) (HCC)    Cough    Dysrhythmia    GERD (gastroesophageal reflux disease)    HFrEF (heart failure with reduced ejection fraction) (HCC)    Hypertension    Hypertriglyceridemia    Ischemic cardiomyopathy    Right atrial thrombus    Tobacco abuse    There were no vitals taken for this visit.  Opioid Risk Score:   Fall Risk Score:  `1  Depression screen PHQ 2/9  Depression screen [facility] 2/9 03/28/2021  03/10/2021 11/22/2020 10/25/2020 09/25/2020 09/03/2020 08/28/2020  Decreased Interest 0 0 0 0 0 0 0  Down, Depressed, Hopeless 0 0 0 0 0 0 0  PHQ - 2 Score 0 0 0 0 0 0 0  Altered sleeping - - - - - - -  Tired, decreased energy - - - - - - -  Change in appetite - - - - - - -  Feeling bad or failure about yourself  - - - - - - -  Trouble concentrating - - - - - - -  Moving slowly or fidgety/restless - - - - - - -  Suicidal thoughts - - - - - - -  PHQ-9 Score - - - - - - -  Difficult doing work/chores - - - - - - -     Review of Systems  Constitutional: Negative.   HENT: Negative.    Eyes: Negative.   Respiratory: Negative.    Cardiovascular: Negative.   Gastrointestinal: Negative.   Endocrine: Negative.   Genitourinary: Negative.   Musculoskeletal:  Positive for arthralgias, back pain and myalgias.  Skin: Negative.   Allergic/Immunologic: Negative.   Neurological:        Tingling  Hematological:  Bruises/bleeds easily.       Plavix  Psychiatric/Behavioral: Negative.    All other systems reviewed and are negative.     Objective:    Not performed as patient was seen via phone.       Assessment & Plan:  1. Left Shoulder Pain: Continue HEP as Tolerated. Continue current medication regimen. Continue to monitor. 11/22/2020  2.Left Greater Trochanter Bursitis: S/P Left Hip Injection with Dr Ranell Patrick on 09/03/2020, no relief noted he reports, Continue to alternate with Heat an Ice Therapy. Continue to monitor. 11/22/2020  3. Bilateral  Knees chondral defects: Continue HEP as Tolerated. Continue current medication regimen. 11/22/2020. Discussed results of bilateral knee MRIs which showed chondral deficits. Advised application of voltaren gel. Advised icing in evenings. Did not benefit from prior steroid taper. Referred to ortho for eval for arthroscopic debridement.  -discussed that pain could be secondary to opioid induced testoserone deficiency -testosterone level checked and it is is  low. -Discussed the risks and benefits of testosterone replacement therapy and he is interested in trying this  -PSA should be monitored while using testosterone replacement therapy as there is an increased risk of prostate cancer. Will check this tomorrow.   3. Chest Wall Discomfort/ Neuropathic Pain: Continue Gabapentin. Continue to Monitor. 11/22/2020. Prescribed lidocaine patch.  -Cymbalta 6m daily prescribed -Discussed Qutenza as an option for neuropathic pain control. Discussed that this is a capsaicin patch, stronger than capsaicin cream. Discussed that it is currently approved for diabetic peripheral neuropathy and post-herpetic neuralgia, but that it has also shown benefit in treating other forms of neuropathy. Provided patient with link to site to learn more about the patch: hCinemaBonus.fr Discussed that the patch would be placed in office and benefits usually last 3 months. Discussed that unintended exposure to capsaicin can cause severe irritation of eyes, mucous membranes, respiratory tract, and skin, but that Qutenza is a local treatment and does not have the systemic side effects of other nerve medications. Discussed that there may be pain, itching, erythema, and decreased sensory function associated with the application of Qutenza. Side effects usually subside within 1 week. A cold pack of analgesic medications can help with these side effects. Blood pressure can also be increased due to pain associated with administration of the patch.  2 patches of Qutenza was applied to the areas of pain. Ice packs were applied during the procedure to ensure patient comfort. Blood pressure was monitored every 15 minutes. The patient tolerated the procedure well. Post-procedure instructions were given and follow-up has been scheduled.    -4. Chronic Pain Syndrome: :  Continue Oxycodone 10/325 mg 6 times a day as needed for pain #180. We will continue the opioid monitoring program, this  consists of regular clinic visits, examinations, urine drug screen, pill [place]. A 12 month [place] 11[place]22/2020. Discussed risks and benefits of opioid medications.  -Provided with a pain relief journal and discussed that it contains foods and lifestyle tips to naturally help to improve pain. Discussed that these lifestyle strategies are also very good for health unlike some medications which can have negative side effects. Discussed that the act of keeping a journal can be therapeutic and helpful to realize patterns what helps to trigger and alleviate pain.   -Discussed following foods that may reduce pain: 1) Ginger (especially studied for arthritis)- reduce leukotriene production to decrease inflammation 2) Blueberries- high in phytonutrients that decrease inflammation 3) Salmon- marine omega-3s reduce joint swelling and pain 4) Pumpkin seeds- reduce inflammation 5) dark chocolate- reduces inflammation 6) turmeric- reduces inflammation 7) tart cherries - reduce pain and stiffness 8) extra virgin olive oil - its compound olecanthal helps to block prostaglandins  9) chili peppers- can be eaten or applied topically via capsaicin 10) mint- helpful for headache, muscle aches, joint pain, and itching 11) garlic- reduces inflammation  Link to further information on diet for chronic pain: hhttp://www.randall.com/  5) Insomnia: prescribed trazodone 567mHS  6) Low back pain: -Discussed results of MRI which shows disc degeneration -discussed that muscle and ligament pathology can also cause severe pain at times -encouraged continued exercise and working to send blood flow to injured area and promote healing  7) Cervical radiuculitis: -Discussed that cervical XR showed disc  degeneration  8) Testosterone deficiency See #3 and 6   9 minutes spent in discussion of lab work, low testosterone level, risks and benefits of opioid therapy as levels could be lowered by opioid medication, natural ways to increase testosterone levels, risks and benefits of testosterone replacement, plan to check PSA levels first.

## 2021-04-03 ENCOUNTER — Other Ambulatory Visit (HOSPITAL_COMMUNITY): Payer: Self-pay | Admitting: Cardiology

## 2021-04-03 ENCOUNTER — Other Ambulatory Visit (HOSPITAL_COMMUNITY): Payer: Self-pay | Admitting: *Deleted

## 2021-04-03 LAB — PSA: Prostate Specific Ag, Serum: 0.4 ng/mL (ref 0.0–4.0)

## 2021-04-04 ENCOUNTER — Other Ambulatory Visit: Payer: Self-pay

## 2021-04-04 ENCOUNTER — Encounter (HOSPITAL_BASED_OUTPATIENT_CLINIC_OR_DEPARTMENT_OTHER): Payer: BLUE CROSS/BLUE SHIELD | Admitting: Physical Medicine and Rehabilitation

## 2021-04-04 DIAGNOSIS — M545 Low back pain, unspecified: Secondary | ICD-10-CM | POA: Diagnosis not present

## 2021-04-04 DIAGNOSIS — G8929 Other chronic pain: Secondary | ICD-10-CM

## 2021-04-04 DIAGNOSIS — M9420 Chondromalacia, unspecified site: Secondary | ICD-10-CM | POA: Diagnosis not present

## 2021-04-04 DIAGNOSIS — Z125 Encounter for screening for malignant neoplasm of prostate: Secondary | ICD-10-CM

## 2021-04-04 DIAGNOSIS — E349 Endocrine disorder, unspecified: Secondary | ICD-10-CM

## 2021-04-05 MED ORDER — TESTOSTERONE 2 MG/24HR TD PT24: 1.0000 | MEDICATED_PATCH | Freq: Every evening | TRANSDERMAL | 0 refills | Status: DC

## 2021-04-05 NOTE — Progress Notes (Signed)
Subjective:    Patient ID: William Crawford, male    DOB: 28-Oct-1970, 50 y.o.   MRN: [id number]  HPI: An audio/video tele-health visit is felt to be the most appropriate encounter for this patient at this time.  This is a follow up tele-visit via phone. The patient is at home. MD is at office.        William Crawford is a 50 y.o. male who presents for follow-up of worsening low back pain, bilateral knee pain, chest pain, and neck pain.   -currently pain is worse in both knees and his low back. -The knee pain is located just under the knee cap and extends medially to laterally -He is feeling much better this week and has been able to work, but still having significant knee and low back pain -He was unable to work today due to pain in both back and knees -Also has chest pain at times- sharp and lasts three minutes when he has it.  -Pain has grown excruciating but he has been able to return to work as he needs to financially. -Discussed results of his bilateral knee MRI, his neck XR, and his back XR -He continues to have chest pain and had recent follow-up with cardiology. Would still like to try the Qutenza patch -he has never tried Cymbalta and would be willing to -he requests if oxycodone can be increased to 6 tablets per day.  -does not note radiation of pain into legs, but legs have given out dur to pain causing near fall -he does not note loss of bowel or bladder function -does note constipation -also having shooting pain down both arms into pinky fingers -discussed that bilateral knee pain and back pain could potentially be related to opioid-induced testosterone deficiency and he is agreeable to checking testosterone levels -he is also interested in ortho follow-up for his knee pain -he is feeling better today and was able to go to work -had burning pain over the weekend from the [place] that is now gone. Forgot to use the cooling gel. -he has never had PSA checked, discussed that  result is 0.4 which is low, so we could trial testosterone patch given his deficiency and symptoms if he would like. He is agreeable and I have sent to his pharmacy.   Prior history: He states his pain is located in his left shoulder, lower back pain, left hip and bilateral knee pain. He was in contact with Dr Ranell Patrick regarding his increase intensity of pain, per Dr Ranell Patrick note his oxycodone will be increased to 5 times a day as needed for pain. William Crawford understanding.  He rates his pain 4. His current exercise regime is walking and performing stretching exercises.  William Crawford equivalent is 60.00  MME.   Last UDs was Performed on 08/28/2020, it was consistent.   He is having a good summer.   He fell down and hit his right ankle and got a big bruise- not hurting too bad.   He had a good time with his grandson riding rolling coasters at [employer]  His chest pain returned and it feels sore and he plans to f/u with his surgeon regarding the pain. It feels sharp, hot, like a burning He takes Gabapentin 683m- but that doesn't help unless used with the oxycodone.  Currently his pain is worse in bilateral knees- it is worst when he kneels, climbs ladders. He has a supportive community at work which allow him to  limit his kneeling and he has been using pads when he kneels. His oxycodone is now only lasting him about 2.5 hours after he takes it and he has been taking them 5 times per day. He asks if there is anything stronger he can take. He has tried Voltaren gel in the past and its effect usually started an hour after application.      Pain Inventory Average Pain 8 Pain Right Now 7 My pain is sharp, burning, stabbing, tingling, and aching  In the last 24 hours, has pain interfered with the following? General activity 10 Relation with others 10 Enjoyment of life 10 What TIME of day is your pain at its worst? morning  and daytime Sleep (in general) Fair  Pain is  worse with: walking and bending Pain improves with: rest and medication Relief from Meds: 9  Family History  Problem Relation Age of Onset   Heart attack Father    Heart disease Father    Heart attack Paternal Grandfather    Heart disease Paternal Grandfather    Heart attack Paternal Uncle    Heart disease Paternal Uncle    Heart attack Paternal Uncle    Heart disease Paternal Uncle    Social History   Socioeconomic History   Marital status: Single    Spouse name: Not on file   Number of children: Not on file   Years of education: Not on file   Highest education level: Not on file  Occupational History   Not on file  Tobacco Use   Smoking status: Former    Types: Cigarettes    Quit date: 10/23/2019    Years since quitting: 1.4   Smokeless tobacco: Never  Vaping Use   Vaping Use: Never used  Substance and Sexual Activity   Alcohol use: Not Currently    Comment: QUIT  IN MAY 88502   Drug use: No   Sexual activity: Not on file  Other Topics Concern   Not on file  Social History Narrative   Not on file   Social Determinants of Health   Financial Resource Strain: Not on file  Food Insecurity: Not on file  Transportation Needs: Not on file  Physical Activity: Not on file  Stress: Not on file  Social Connections: Not on file   Past Surgical History:  Procedure Laterality Date   APPLICATION OF WOUND VAC  10/25/2019   Procedure: Application Of Wound Vac;  Surgeon: [name], MD;  Location: [place];  Service: Open Heart Surgery;;   CANNULATION FOR ECMO (EXTRACORPOREAL MEMBRANE OXYGENATION)  10/25/2019   Procedure: Cannulation For Ecmo (Extracorporeal Membrane Oxygenation) @ 1531;  Surgeon: Prescott Gum, Collier Salina, MD;  Location: [place];  Service: Open Heart Surgery;;   CANNULATION FOR ECMO (EXTRACORPOREAL MEMBRANE OXYGENATION) N/A 10/30/2019   Procedure: DECANNULATION OF ECMO (EXTRACORPOREAL MEMBRANE OXYGENATION);  Surgeon: Prescott Gum, Collier Salina, MD;  Location: [place];  Service: Open  Heart Surgery;  Laterality: N/A;  Pump standby   CHEST EXPLORATION  10/25/2019   Procedure: Chest Exploration for repair of coronary perforation (repair of LAD);  Surgeon: Prescott Gum, Collier Salina, MD;  Location: [place];  Service: Open Heart Surgery;;   CORONARY CTO INTERVENTION N/A 10/25/2019   Procedure: CORONARY CTO INTERVENTION;  Surgeon: Martinique, Peter M, MD;  Location: [place] [facility];  Service: Cardiovascular;  Laterality: N/A;   CORONARY STENT INTERVENTION N/A 10/17/2019   Procedure: CORONARY STENT INTERVENTION;  Surgeon: Sherren Mocha, MD;  Location: [place] [facility];  Service: Cardiovascular;  Laterality: N/A;   LEFT HEART CATH AND CORONARY ANGIOGRAPHY N/A 10/17/2019   Procedure: LEFT HEART CATH AND CORONARY ANGIOGRAPHY;  Surgeon: Sherren Mocha, MD;  Location: [place] [facility];  Service: Cardiovascular;  Laterality: N/A;   [place]  10/25/2019   Procedure: Median Sternotomy.;  Surgeon: [name], MD;  Location: [facility];  Service: Open Heart Surgery;;   PLACEMENT OF IMPELLA LEFT VENTRICULAR ASSIST DEVICE  10/30/2019   Procedure: Placement Of Impella Left Ventricular Assist Device 5.5 USING 10MM HEMASHIELD PLATINUM GRAFT;  Surgeon: [name], MD;  Location: [place];  Service: Open Heart Surgery;;  graft placed into aorta   REMOVAL OF IMPELLA LEFT VENTRICULAR ASSIST DEVICE N/A 11/03/2019   Procedure: REMOVAL OF IMPELLA 5.5 LEFT VENTRICULAR ASSIST DEVICE;  Surgeon: [name], MD;  Location: [place];  Service: Open Heart Surgery;  Laterality: N/A;   STERNAL CLOSURE N/A 10/30/2019   Procedure: Sternal Closure;  Surgeon: [name], MD;  Location: [place];  Service: Open Heart Surgery;  Laterality: N/A;   STERNAL WIRES REMOVAL N/A 05/14/2020   Procedure: STERNAL WIRES REMOVAL;  Surgeon: [name], MD;  Location: [place];  Service: Thoracic;  Laterality: N/A;   TEE WITHOUT CARDIOVERSION N/A 10/25/2019   Procedure: TRANSESOPHAGEAL ECHOCARDIOGRAM (TEE);  Surgeon: Prescott Gum,  Collier Salina, MD;  Location: [place];  Service: Open Heart Surgery;  Laterality: N/A;   TEE WITHOUT CARDIOVERSION N/A 10/30/2019   Procedure: TRANSESOPHAGEAL ECHOCARDIOGRAM (TEE);  Surgeon: Prescott Gum, Collier Salina, MD;  Location: [place];  Service: Open Heart Surgery;  Laterality: N/A;   TEE WITHOUT CARDIOVERSION N/A 11/03/2019   Procedure: TRANSESOPHAGEAL ECHOCARDIOGRAM (TEE);  Surgeon: Prescott Gum, Collier Salina, MD;  Location: [place];  Service: Open Heart Surgery;  Laterality: N/A;   Past Surgical History:  Procedure Laterality Date   APPLICATION OF WOUND VAC  10/25/2019   Procedure: Application Of Wound Vac;  Surgeon: [name], MD;  Location: [place];  Service: Open Heart Surgery;;   CANNULATION FOR ECMO (EXTRACORPOREAL MEMBRANE OXYGENATION)  10/25/2019   Procedure: Cannulation For Ecmo (Extracorporeal Membrane Oxygenation) @ 4098;  Surgeon: [name], MD;  Location: [place];  Service: Open Heart Surgery;;   CANNULATION FOR ECMO (EXTRACORPOREAL MEMBRANE OXYGENATION) N/A 10/30/2019   Procedure: DECANNULATION OF ECMO (EXTRACORPOREAL MEMBRANE OXYGENATION);  Surgeon: Prescott Gum, Collier Salina, MD;  Location: [place];  Service: Open Heart Surgery;  Laterality: N/A;  Pump standby   CHEST EXPLORATION  10/25/2019   Procedure: Chest Exploration for repair of coronary perforation (repair of LAD);  Surgeon: Prescott Gum, Collier Salina, MD;  Location: [place];  Service: Open Heart Surgery;;   CORONARY CTO INTERVENTION N/A 10/25/2019   Procedure: CORONARY CTO INTERVENTION;  Surgeon: Martinique, Peter M, MD;  Location: [place] [facility];  Service: Cardiovascular;  Laterality: N/A;   CORONARY STENT INTERVENTION N/A 10/17/2019   Procedure: CORONARY STENT INTERVENTION;  Surgeon: Sherren Mocha, MD;  Location: [place] [facility];  Service: Cardiovascular;  Laterality: N/A;   LEFT HEART CATH AND CORONARY ANGIOGRAPHY N/A 10/17/2019   Procedure: LEFT HEART CATH AND CORONARY ANGIOGRAPHY;  Surgeon: Sherren Mocha, MD;  Location: [place] [facility];  Service:  Cardiovascular;  Laterality: N/A;   [place]  10/25/2019   Procedure: Median Sternotomy.;  Surgeon: [name], MD;  Location: [place];  Service: Open Heart Surgery;;   PLACEMENT OF IMPELLA LEFT VENTRICULAR ASSIST DEVICE  10/30/2019   Procedure: Placement Of Impella Left Ventricular Assist Device 5.5 USING  10MM HEMASHIELD PLATINUM GRAFT;  Surgeon: Prescott Gum, Collier Salina, MD;  Location: [place];  Service: Open Heart Surgery;;  graft placed into aorta   REMOVAL OF IMPELLA LEFT VENTRICULAR ASSIST DEVICE N/A 11/03/2019   Procedure: REMOVAL OF IMPELLA 5.5 LEFT VENTRICULAR ASSIST DEVICE;  Surgeon: [name], MD;  Location: [place];  Service: Open Heart Surgery;  Laterality: N/A;   STERNAL CLOSURE N/A 10/30/2019   Procedure: Sternal Closure;  Surgeon: [name], MD;  Location: [place];  Service: Open Heart Surgery;  Laterality: N/A;   STERNAL WIRES REMOVAL N/A 05/14/2020   Procedure: STERNAL WIRES REMOVAL;  Surgeon: [name], MD;  Location: [place];  Service: Thoracic;  Laterality: N/A;   TEE WITHOUT CARDIOVERSION N/A 10/25/2019   Procedure: TRANSESOPHAGEAL ECHOCARDIOGRAM (TEE);  Surgeon: Prescott Gum, Collier Salina, MD;  Location: [place];  Service: Open Heart Surgery;  Laterality: N/A;   TEE WITHOUT CARDIOVERSION N/A 10/30/2019   Procedure: TRANSESOPHAGEAL ECHOCARDIOGRAM (TEE);  Surgeon: Prescott Gum, Collier Salina, MD;  Location: [place];  Service: Open Heart Surgery;  Laterality: N/A;   TEE WITHOUT CARDIOVERSION N/A 11/03/2019   Procedure: TRANSESOPHAGEAL ECHOCARDIOGRAM (TEE);  Surgeon: Prescott Gum, Collier Salina, MD;  Location: [place];  Service: Open Heart Surgery;  Laterality: N/A;   Past Medical History:  Diagnosis Date   Alcohol abuse    Anxiety    CAD (coronary artery disease)    Cardiac arrest ([place])    Cardiogenic shock (HCC)    CHF (congestive heart failure) (HCC)    Cough    Dysrhythmia    GERD (gastroesophageal reflux disease)    HFrEF (heart failure with reduced ejection fraction) (HCC)    Hypertension     Hypertriglyceridemia    Ischemic cardiomyopathy    Right atrial thrombus    Tobacco abuse    There were no vitals taken for this visit.  Opioid Risk Score:   Fall Risk Score:  `1  Depression screen PHQ 2/9  Depression screen [facility] 2/9 03/28/2021 03/10/2021 11/22/2020 10/25/2020 09/25/2020 09/03/2020 08/28/2020  Decreased Interest 0 0 0 0 0 0 0  Down, Depressed, Hopeless 0 0 0 0 0 0 0  PHQ - 2 Score 0 0 0 0 0 0 0  Altered sleeping - - - - - - -  Tired, decreased energy - - - - - - -  Change in appetite - - - - - - -  Feeling bad or failure about yourself  - - - - - - -  Trouble concentrating - - - - - - -  Moving slowly or fidgety/restless - - - - - - -  Suicidal thoughts - - - - - - -  PHQ-9 Score - - - - - - -  Difficult doing work/chores - - - - - - -     Review of Systems  Constitutional: Negative.   HENT: Negative.    Eyes: Negative.   Respiratory: Negative.    Cardiovascular: Negative.   Gastrointestinal: Negative.   Endocrine: Negative.   Genitourinary: Negative.   Musculoskeletal:  Positive for arthralgias, back pain and myalgias.  Skin: Negative.   Allergic/Immunologic: Negative.   Neurological:        Tingling  Hematological:  Bruises/bleeds easily.       Plavix  Psychiatric/Behavioral: Negative.    All other systems reviewed and are negative.     Objective:    Not performed as patient was seen via phone.       Assessment & Plan:  1. Left Shoulder Pain: Continue HEP as Tolerated. Continue current medication regimen. Continue to monitor. 11/22/2020  2.Left Greater Trochanter Bursitis: S/P Left Hip Injection with Dr Ranell Patrick on 09/03/2020, no relief noted he reports, Continue to alternate with Heat an Ice Therapy. Continue to monitor. 11/22/2020  3. Bilateral  Knees chondral defects: Continue HEP as Tolerated. Continue current medication regimen. 11/22/2020. Discussed results of bilateral knee MRIs which showed chondral deficits. Advised application of voltaren  gel. Advised icing in evenings. Did not benefit from prior steroid taper. Referred to ortho for eval for arthroscopic debridement.  -discussed that pain could be secondary to opioid induced testoserone deficiency -testosterone level checked and it is is low. -Discussed the risks and benefits of testosterone replacement therapy and he is interested in trying this  -PSA should be monitored while using testosterone replacement therapy as there is an increased risk of prostate cancer. Will check this tomorrow.   3. Chest Wall Discomfort/ Neuropathic Pain: Continue Gabapentin. Continue to Monitor. 11/22/2020. Prescribed lidocaine patch.  -Cymbalta 39m daily prescribed -Discussed Qutenza as an option for neuropathic pain control. Discussed that this is a capsaicin patch, stronger than capsaicin cream. Discussed that it is currently approved for diabetic peripheral neuropathy and post-herpetic neuralgia, but that it has also shown benefit in treating other forms of neuropathy. Provided patient with link to site to learn more about the patch: hCinemaBonus.fr Discussed that the patch would be placed in office and benefits usually last 3 months. Discussed that unintended exposure to capsaicin can cause severe irritation of eyes, mucous membranes, respiratory tract, and skin, but that Qutenza is a local treatment and does not have the systemic side effects of other nerve medications. Discussed that there may be pain, itching, erythema, and decreased sensory function associated with the application of Qutenza. Side effects usually subside within 1 week. A cold pack of analgesic medications can help with these side effects. Blood pressure can also be increased due to pain associated with administration of the patch.  2 patches of Qutenza was applied to the areas of pain. Ice packs were applied during the procedure to ensure patient comfort. Blood pressure was monitored every 15 minutes. The patient tolerated  the procedure well. Post-procedure instructions were given and follow-up has been scheduled.    -4. Chronic Pain Syndrome: :  Continue Oxycodone 10/325 mg 6 times a day as needed for pain #180. We will continue the opioid monitoring program, this consists of regular clinic visits, examinations, urine drug screen, pill counts [place] well [place]. A 12 month [place] 11[place]22/2020. Discussed risks and benefits of opioid medications.  -Provided with a pain relief journal and discussed that it contains foods and lifestyle tips to naturally help to improve pain. Discussed that these lifestyle strategies are also very good for health unlike some medications which can have negative side effects. Discussed that the act of keeping a journal can be therapeutic and helpful to realize patterns what helps to trigger and alleviate pain.   -Discussed following foods that may reduce pain: 1) Ginger (especially studied for arthritis)- reduce leukotriene production to decrease inflammation 2) Blueberries- high in phytonutrients that decrease inflammation 3) Salmon- marine omega-3s reduce joint swelling and pain 4) Pumpkin seeds- reduce inflammation 5) dark chocolate- reduces inflammation 6) turmeric- reduces inflammation 7) tart cherries - reduce pain and stiffness 8) extra virgin olive oil - its compound olecanthal helps to  block prostaglandins  9) chili peppers- can be eaten or applied topically via capsaicin 10) mint- helpful for headache, muscle aches, joint pain, and itching 11) garlic- reduces inflammation  Link to further information on diet for chronic pain: http://www.randall.com/   5) Insomnia: prescribed trazodone 79m HS  6) Low back pain: -Discussed results of MRI which shows disc degeneration -discussed  that muscle and ligament pathology can also cause severe pain at times -encouraged continued exercise and working to send blood flow to injured area and promote healing  7) Cervical radiuculitis: -Discussed that cervical XR showed disc degeneration  8) Testosterone deficiency See #3 and 6 Prescribed 21mpatch to be applied daily at night- discussed to alternate sites between arms, thighs, and belly.  Discussed risks and benefits of supplementation PSA checked and 0.4. Discussed that we should repeat in 3 weeks to ensure there is no increase   5 minutes spent in discussion if his PSA level, repeating in 3 weeks, testosterone deficiency, knee and back pain, prescribing testosterone patch for him to be applied every night at different sites between his arms, legs, and belly, leaving a message for his pharmacy.

## 2021-04-08 ENCOUNTER — Other Ambulatory Visit: Payer: Self-pay

## 2021-04-08 ENCOUNTER — Encounter: Payer: Self-pay | Admitting: Orthopaedic Surgery

## 2021-04-08 ENCOUNTER — Ambulatory Visit (INDEPENDENT_AMBULATORY_CARE_PROVIDER_SITE_OTHER): Payer: BLUE CROSS/BLUE SHIELD | Admitting: Orthopaedic Surgery

## 2021-04-08 ENCOUNTER — Telehealth: Payer: BLUE CROSS/BLUE SHIELD | Admitting: Physical Medicine and Rehabilitation

## 2021-04-08 DIAGNOSIS — M1712 Unilateral primary osteoarthritis, left knee: Secondary | ICD-10-CM

## 2021-04-08 DIAGNOSIS — M1711 Unilateral primary osteoarthritis, right knee: Secondary | ICD-10-CM | POA: Diagnosis not present

## 2021-04-08 DIAGNOSIS — M170 Bilateral primary osteoarthritis of knee: Secondary | ICD-10-CM

## 2021-04-08 MED ORDER — BUPIVACAINE HCL 0.5 % IJ SOLN
2.0000 mL | INTRAMUSCULAR | Status: AC | PRN
Start: 2021-04-08 — End: 2021-04-08
  Administered 2021-04-08: 2 mL via INTRA_ARTICULAR

## 2021-04-08 MED ORDER — METHYLPREDNISOLONE ACETATE 40 MG/ML IJ SUSP
40.0000 mg | INTRAMUSCULAR | Status: AC | PRN
  Administered 2021-04-08: 40 mg via INTRA_ARTICULAR

## 2021-04-08 MED ORDER — BUPIVACAINE HCL 0.5 % IJ SOLN
2.0000 mL | INTRAMUSCULAR | Status: AC | PRN
  Administered 2021-04-08: 2 mL via INTRA_ARTICULAR

## 2021-04-08 MED ORDER — LIDOCAINE HCL 1 % IJ SOLN
2.0000 mL | INTRAMUSCULAR | Status: AC | PRN
  Administered 2021-04-08: 2 mL

## 2021-04-08 MED ORDER — DICLOFENAC SODIUM 2 % EX SOLN: 2.0000 g | Freq: Two times a day (BID) | CUTANEOUS | 3 refills | Status: DC | PRN

## 2021-04-08 NOTE — Progress Notes (Signed)
Office Visit Note   Patient: William Crawford           Date of Birth: [birth year]           MRN: [id number] Visit Date: 04/08/2021              Requested by: [name], MD [phone],  [place] [address] PCP: [name], MD   Assessment & Plan: Visit Diagnoses:  1. Primary osteoarthritis of right knee   2. Primary osteoarthritis of left knee     Plan: Impression is bilateral knee chondromalacia patella.  MRIs were reviewed and treatment options were discussed and I have recommended physical therapy and NSAID ointment as well as cortisone injections.  Reassurance was provided that he likely does not need surgery based on findings.  Follow-Up Instructions: No follow-ups on file.   Orders:  Orders Placed This Encounter  Procedures   Ambulatory referral to Physical Therapy   Meds ordered this encounter  Medications   Diclofenac Sodium (PENNSAID) 2 % SOLN    Sig: Apply 2 g topically 2 (two) times daily as needed (to affected area).    Dispense:  112 g    Refill:  3      Procedures: Large Joint Inj: bilateral knee on 04/08/2021 4:35 PM Indications: pain Details: 22 G needle  Arthrogram: No  Medications (Right): 2 mL lidocaine 1 %; 2 mL bupivacaine 0.5 %; 40 mg methylPREDNISolone acetate 40 MG/ML Medications (Left): 2 mL lidocaine 1 %; 2 mL bupivacaine 0.5 %; 40 mg methylPREDNISolone acetate 40 MG/ML Outcome: tolerated well, no immediate complications Patient was prepped and draped in the usual sterile fashion.      Clinical Data: No additional findings.   Subjective: Chief Complaint  Patient presents with   Right Knee - New Patient (Initial Visit)   Left Knee - New Patient (Initial Visit)    William Crawford is a 50 year old gentleman here for evaluation of bilateral knee pain.  Referral from his chronic pain doctor.  He takes oxycodone for chronic back pain.  He wears copper fit compression sleeves.  He takes Plavix for prior  cardiac issues.  Cannot take NSAIDs.  Endorses rubbing and grinding pain to the front of the knee.  Denies any mechanical symptoms.  Pain is worse with climbing ladders and using stairs.   Review of Systems  Constitutional: Negative.   All other systems reviewed and are negative.   Objective: Vital Signs: There were no vitals taken for this visit.  Physical Exam Vitals and nursing note reviewed.  Constitutional:      Appearance: He is well-developed.  Pulmonary:     Effort: Pulmonary effort is normal.  Abdominal:     Palpations: Abdomen is soft.  Skin:    General: Skin is warm.  Neurological:     Mental Status: He is alert and oriented to person, place, and time.  Psychiatric:        Behavior: Behavior normal.        Thought Content: Thought content normal.        Judgment: Judgment normal.    Ortho Exam  Bilateral knees show no significant patellofemoral crepitus with range of motion.  Collaterals and cruciates are stable.  No joint line tenderness.  No joint effusion.  Specialty Comments:  No specialty comments available.  Imaging: No results found.   PMFS History: Patient Active Problem List   Diagnosis Date Noted   Sternal fracture 05/29/2020  Protruding sternal wires 05/08/2020   Near syncope    Syncope 03/18/2020   Neuropathic pain 02/07/2020   Chest wall discomfort 01/17/2020   Encounter for post surgical wound check 01/03/2020   Post-op pain 12/20/2019   Encounter for postoperative wound check 12/13/2019   Personal history of ECMO 11/22/2019   Postop check 11/17/2019   Encounter for therapeutic drug monitoring 11/14/2019   Atrial thrombus 11/14/2019   Endotracheally intubated    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[facility])    Status post cardiac surgery    Acute respiratory failure with hypoxia (HCC)    CAD (coronary artery disease) 10/25/2019   Angina pectoris ([place]) 10/25/2019   Myocardial infarction acute ([place]) 10/25/2019   Cardiogenic  shock ([place]) 10/25/2019   Tobacco use 10/18/2019   Ischemic cardiomyopathy 10/18/2019   Hypertension 10/18/2019   Hyperlipemia 10/18/2019   Alcohol abuse 10/18/2019   NSTEMI (non-ST elevated myocardial infarction) ([place]) 10/17/2019   Past Medical History:  Diagnosis Date   Alcohol abuse    Anxiety    CAD (coronary artery disease)    Cardiac arrest ([place])    Cardiogenic shock (HCC)    CHF (congestive heart failure) (HCC)    Cough    Dysrhythmia    GERD (gastroesophageal reflux disease)    HFrEF (heart failure with reduced ejection fraction) (HCC)    Hypertension    Hypertriglyceridemia    Ischemic cardiomyopathy    Right atrial thrombus    Tobacco abuse     Family History  Problem Relation Age of Onset   Heart attack Father    Heart disease Father    Heart attack Paternal Grandfather    Heart disease Paternal Grandfather    Heart attack Paternal Uncle    Heart disease Paternal Uncle    Heart attack Paternal Uncle    Heart disease Paternal Uncle     Past Surgical History:  Procedure Laterality Date   APPLICATION OF WOUND VAC  10/25/2019   Procedure: Application Of Wound Vac;  Surgeon: [name], MD;  Location: [facility];  Service: Open Heart Surgery;;   CANNULATION FOR ECMO (EXTRACORPOREAL MEMBRANE OXYGENATION)  10/25/2019   Procedure: Cannulation For Ecmo (Extracorporeal Membrane Oxygenation) @ 1531;  Surgeon: [name], MD;  Location: [facility] OR;  Service: Open Heart Surgery;;   CANNULATION FOR ECMO (EXTRACORPOREAL MEMBRANE OXYGENATION) N/A 10/30/2019   Procedure: DECANNULATION OF ECMO (EXTRACORPOREAL MEMBRANE OXYGENATION);  Surgeon: Prescott Gum, Collier Salina, MD;  Location: [place];  Service: Open Heart Surgery;  Laterality: N/A;  Pump standby   CHEST EXPLORATION  10/25/2019   Procedure: Chest Exploration for repair of coronary perforation (repair of LAD);  Surgeon: Prescott Gum, Collier Salina, MD;  Location: [place];  Service: Open Heart Surgery;;   CORONARY CTO INTERVENTION N/A 10/25/2019    Procedure: CORONARY CTO INTERVENTION;  Surgeon: Martinique, Peter M, MD;  Location: [place] [facility];  Service: Cardiovascular;  Laterality: N/A;   CORONARY STENT INTERVENTION N/A 10/17/2019   Procedure: CORONARY STENT INTERVENTION;  Surgeon: Sherren Mocha, MD;  Location: [place] [facility];  Service: Cardiovascular;  Laterality: N/A;   LEFT HEART CATH AND CORONARY ANGIOGRAPHY N/A 10/17/2019   Procedure: LEFT HEART CATH AND CORONARY ANGIOGRAPHY;  Surgeon: Sherren Mocha, MD;  Location: [place] [facility];  Service: Cardiovascular;  Laterality: N/A;   [place]  10/25/2019   Procedure: Median Sternotomy.;  Surgeon: [name], MD;  Location: [place];  Service: Open Heart Surgery;;   PLACEMENT OF IMPELLA LEFT VENTRICULAR  ASSIST DEVICE  10/30/2019   Procedure: Placement Of Impella Left Ventricular Assist Device 5.5 USING 10MM HEMASHIELD PLATINUM GRAFT;  Surgeon: [name], MD;  Location: [place];  Service: Open Heart Surgery;;  graft placed into aorta   REMOVAL OF IMPELLA LEFT VENTRICULAR ASSIST DEVICE N/A 11/03/2019   Procedure: REMOVAL OF IMPELLA 5.5 LEFT VENTRICULAR ASSIST DEVICE;  Surgeon: [name], MD;  Location: [place];  Service: Open Heart Surgery;  Laterality: N/A;   STERNAL CLOSURE N/A 10/30/2019   Procedure: Sternal Closure;  Surgeon: [name], MD;  Location: [place];  Service: Open Heart Surgery;  Laterality: N/A;   STERNAL WIRES REMOVAL N/A 05/14/2020   Procedure: STERNAL WIRES REMOVAL;  Surgeon: [name], MD;  Location: [place];  Service: Thoracic;  Laterality: N/A;   TEE WITHOUT CARDIOVERSION N/A 10/25/2019   Procedure: TRANSESOPHAGEAL ECHOCARDIOGRAM (TEE);  Surgeon: Prescott Gum, Collier Salina, MD;  Location: [place];  Service: Open Heart Surgery;  Laterality: N/A;   TEE WITHOUT CARDIOVERSION N/A 10/30/2019   Procedure: TRANSESOPHAGEAL ECHOCARDIOGRAM (TEE);  Surgeon: Prescott Gum, Collier Salina, MD;  Location: [place];  Service: Open Heart Surgery;  Laterality: N/A;   TEE WITHOUT  CARDIOVERSION N/A 11/03/2019   Procedure: TRANSESOPHAGEAL ECHOCARDIOGRAM (TEE);  Surgeon: Prescott Gum, Collier Salina, MD;  Location: [place];  Service: Open Heart Surgery;  Laterality: N/A;   Social History   Occupational History   Not on file  Tobacco Use   Smoking status: Former    Types: Cigarettes    Quit date: 10/23/2019    Years since quitting: 1.4   Smokeless tobacco: Never  Vaping Use   Vaping Use: Never used  Substance and Sexual Activity   Alcohol use: Not Currently    Comment: QUIT  IN MAY [id number]   Drug use: No   Sexual activity: Not on file

## 2021-04-13 ENCOUNTER — Encounter: Payer: Self-pay | Admitting: Physical Medicine and Rehabilitation

## 2021-04-14 ENCOUNTER — Other Ambulatory Visit: Payer: Self-pay | Admitting: Physical Medicine and Rehabilitation

## 2021-04-14 MED ORDER — TESTOSTERONE 2 MG/24HR TD PT24: 1.0000 | MEDICATED_PATCH | Freq: Every evening | TRANSDERMAL | 0 refills | Status: DC

## 2021-04-15 ENCOUNTER — Telehealth: Payer: Self-pay | Admitting: Orthopaedic Surgery

## 2021-04-15 ENCOUNTER — Telehealth: Payer: Self-pay

## 2021-04-15 NOTE — Telephone Encounter (Signed)
Pt called requesting his refill be sent to correct pharmacy. Pt states Dr. Erlinda Hong sent triamcinolone cream to pharmacy in [place]. Pt is asking for it to be sent to [facility] on [address]. Please call pt when meds sent to correct pharmacy at [phone].

## 2021-04-15 NOTE — Telephone Encounter (Signed)
PA for Androderm sent to insurance

## 2021-04-21 ENCOUNTER — Other Ambulatory Visit (HOSPITAL_COMMUNITY): Payer: Self-pay | Admitting: *Deleted

## 2021-04-21 MED ORDER — ENTRESTO 49-51 MG PO TABS: 1.0000 | ORAL_TABLET | Freq: Two times a day (BID) | ORAL | 11 refills | Status: DC

## 2021-04-23 ENCOUNTER — Telehealth (HOSPITAL_COMMUNITY): Payer: Self-pay | Admitting: *Deleted

## 2021-04-23 NOTE — Telephone Encounter (Signed)
Pt left  vm requesting return call about medications and a supplement called "[employer]". I attempted to reach pt by phone twice pt picked up the phone but did not say anything.

## 2021-04-24 ENCOUNTER — Telehealth: Payer: Self-pay

## 2021-04-24 ENCOUNTER — Other Ambulatory Visit (HOSPITAL_COMMUNITY): Payer: Self-pay | Admitting: *Deleted

## 2021-04-24 MED ORDER — ENTRESTO 49-51 MG PO TABS
1.0000 | ORAL_TABLET | Freq: Two times a day (BID) | ORAL | 3 refills | Status: DC
Start: 2021-04-24 — End: 2022-05-19

## 2021-04-24 NOTE — Telephone Encounter (Signed)
[employer] denied Androderm stating it does not meet medical necessity.

## 2021-04-28 ENCOUNTER — Other Ambulatory Visit: Payer: Self-pay

## 2021-04-28 ENCOUNTER — Encounter
Payer: BC Managed Care – PPO | Attending: Physical Medicine and Rehabilitation | Admitting: Physical Medicine and Rehabilitation

## 2021-04-28 DIAGNOSIS — M545 Low back pain, unspecified: Secondary | ICD-10-CM | POA: Diagnosis not present

## 2021-04-28 DIAGNOSIS — G8929 Other chronic pain: Secondary | ICD-10-CM | POA: Insufficient documentation

## 2021-04-28 DIAGNOSIS — Z79899 Other long term (current) drug therapy: Secondary | ICD-10-CM | POA: Insufficient documentation

## 2021-04-28 DIAGNOSIS — E349 Endocrine disorder, unspecified: Secondary | ICD-10-CM | POA: Diagnosis not present

## 2021-04-28 DIAGNOSIS — Z5181 Encounter for therapeutic drug level monitoring: Secondary | ICD-10-CM | POA: Insufficient documentation

## 2021-04-28 DIAGNOSIS — M170 Bilateral primary osteoarthritis of knee: Secondary | ICD-10-CM | POA: Insufficient documentation

## 2021-04-28 DIAGNOSIS — G894 Chronic pain syndrome: Secondary | ICD-10-CM | POA: Insufficient documentation

## 2021-04-28 NOTE — Progress Notes (Signed)
Subjective:    Patient ID: William Crawford, male    DOB: Jun 06, 1970, 50 y.o.   MRN: [id number]  HPI: An audio/video tele-health visit is felt to be the most appropriate encounter for this patient at this time. This is a follow up tele-visit via phone. The patient is at home. MD is at office.        William Crawford is a 50 y.o. male who presents for follow-up of worsening low back pain, bilateral knee pain, chest pain, and neck pain.   -currently pain is worse in both knees and his low back. -The knee pain is located just under the knee cap and extends medially to laterally -He is feeling much better this week and has been able to work, but still having significant knee and low back pain -He was unable to work today due to pain in both back and knees -Also has chest pain at times- sharp and lasts three minutes when he has it.  -Pain has grown excruciating but he has been able to return to work as he needs to financially. -Discussed results of his bilateral knee MRI, his neck XR, and his back XR -He continues to have chest pain and had recent follow-up with cardiology. Would still like to try the Qutenza patch -he has never tried Cymbalta and would be willing to -he requests if oxycodone can be increased to 6 tablets per day.  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 Forgot to use the cooling gel. -he has never had PSA checked, discussed that  result is 0.4 which is low, so we could trial testosterone patch given his deficiency and symptoms if he would like. He is agreeable and I have sent to his pharmacy.   Prior history: He states his pain is located in his left shoulder, lower back pain, left hip and bilateral knee pain. He was in contact with Dr Ranell Patrick regarding his increase intensity of pain, per Dr Ranell Patrick note his oxycodone will be increased to 5 times a day as needed for pain. [name] understanding.  He rates his pain 4. His current exercise regime is walking and performing stretching exercises.  [name] is 60.00  MME.   Last UDs was Performed on 08/28/2020, it was consistent.   He is having a good summer.   He fell down and hit his right ankle and got a big bruise- not hurting too bad.   He had a good time with his grandson riding rolling coasters at [employer]  His chest pain returned and it feels sore and he plans to f/u with his surgeon regarding the pain. It feels sharp, hot, like a burning He takes Gabapentin 669m- but that doesn't help unless used with the oxycodone.  Currently his pain is worse in bilateral knees- it is worst when he kneels, climbs ladders. He has a supportive community at work which allow him to limit  his kneeling and he has been using pads when he kneels. His oxycodone is now only lasting him about 2.5 hours after he takes it and he has been taking them 5 times per day. He asks if there is anything stronger he can take. He has tried Voltaren gel in the past and its effect usually started an hour after application.  -got compression sleeves to apply to his knees and has been able to work -at times his knee still buckles and this is painful -he feels he is getting used to the pain with walking but as far as climbing he feels it a lot more -he got a steroid injection by orthopedics and did find that it helped but did not last very long.  -he has been trying to  eat more foods that reduce inflammation  Low testosterone:  -he bought an over the counter supplement since the patch was not covered by insurance     Pain Inventory Average Pain 8 Pain Right Now 7 My pain is sharp, burning, stabbing, tingling, and aching  In the last 24 hours, has pain interfered with the following? General activity 10 Relation with others 10 Enjoyment of life 10 What TIME of day is your pain at its worst? morning  and daytime Sleep (in general) Fair  Pain is worse with: walking and bending Pain improves with: rest and medication Relief from Meds: 9  Family History  Problem Relation Age of Onset   Heart attack Father    Heart disease Father    Heart attack Paternal Grandfather    Heart disease Paternal Grandfather    Heart attack Paternal Uncle    Heart disease Paternal Uncle    Heart attack Paternal Uncle    Heart disease Paternal Uncle    Social History   Socioeconomic History   Marital status: Single    Spouse name: Not on file   Number of children: Not on file   Years of education: Not on file   Highest education level: Not on file  Occupational History   Not on file  Tobacco Use   Smoking status: Former    Types: Cigarettes    Quit date: 10/23/2019    Years since quitting: 1.5   Smokeless tobacco: Never  Vaping Use   Vaping Use: Never used  Substance and Sexual Activity   Alcohol use: Not Currently    Comment: QUIT  IN MAY 61950   Drug use: No   Sexual activity: Not on file  Other Topics Concern   Not on file  Social History Narrative   Not on file   Social Determinants of Health   Financial Resource Strain: Not on file  Food Insecurity: Not on file  Transportation Needs: Not on file  Physical Activity: Not on file  Stress: Not on file  Social Connections: Not on file   Past Surgical History:  Procedure Laterality Date   APPLICATION OF WOUND VAC  10/25/2019   Procedure: Application Of Wound Vac;  Surgeon: [name],  MD;  Location: [place];  Service: Open Heart Surgery;;   CANNULATION FOR ECMO (EXTRACORPOREAL MEMBRANE OXYGENATION)  10/25/2019   Procedure: Cannulation For Ecmo (Extracorporeal Membrane Oxygenation) @ 1531;  Surgeon: Prescott Gum, Collier Salina, MD;  Location: [place];  Service: Open Heart Surgery;;   CANNULATION FOR ECMO (EXTRACORPOREAL MEMBRANE OXYGENATION) N/A 10/30/2019   Procedure: DECANNULATION OF ECMO (EXTRACORPOREAL MEMBRANE OXYGENATION);  Surgeon: Prescott Gum, Collier Salina, MD;  Location: [place];  Service: Open Heart Surgery;  Laterality: N/A;  Pump standby   CHEST EXPLORATION  10/25/2019   Procedure: Chest Exploration for repair of coronary perforation (repair of LAD);  Surgeon: Prescott Gum, Collier Salina, MD;  Location: [place];  Service: Open Heart Surgery;;   CORONARY CTO INTERVENTION N/A 10/25/2019   Procedure: CORONARY CTO INTERVENTION;  Surgeon: Martinique, Peter M, MD;  Location: [place] [facility];  Service: Cardiovascular;  Laterality: N/A;   CORONARY STENT INTERVENTION N/A 10/17/2019   Procedure: CORONARY STENT INTERVENTION;  Surgeon: Sherren Mocha, MD;  Location: [place] [facility];  Service: Cardiovascular;  Laterality: N/A;   LEFT HEART CATH AND CORONARY ANGIOGRAPHY N/A 10/17/2019   Procedure: LEFT HEART CATH AND CORONARY ANGIOGRAPHY;  Surgeon: Sherren Mocha, MD;  Location: [place] [facility];  Service: Cardiovascular;  Laterality: N/A;   [place]  10/25/2019   Procedure: Median Sternotomy.;  Surgeon: [name], MD;  Location[facility];  Service: Open Heart Surgery;;   PLACEMENT OF IMPELLA LEFT VENTRICULAR ASSIST DEVICE  10/30/2019   Procedure: Placement Of Impella Left Ventricular Assist Device 5.5 USING 10MM HEMASHIELD PLATINUM GRAFT;  Surgeon: [name], MD;  Location: [place];  Service: Open Heart Surgery;;  graft placed into aorta   REMOVAL OF IMPELLA LEFT VENTRICULAR ASSIST DEVICE N/A 11/03/2019   Procedure: REMOVAL OF IMPELLA 5.5 LEFT VENTRICULAR ASSIST DEVICE;  Surgeon: [name], MD;  Location:  [place];  Service: Open Heart Surgery;  Laterality: N/A;   STERNAL CLOSURE N/A 10/30/2019   Procedure: Sternal Closure;  Surgeon: [name], MD;  Location: [place];  Service: Open Heart Surgery;  Laterality: N/A;   STERNAL WIRES REMOVAL N/A 05/14/2020   Procedure: STERNAL WIRES REMOVAL;  Surgeon: [name], MD;  Location: [place];  Service: Thoracic;  Laterality: N/A;   TEE WITHOUT CARDIOVERSION N/A 10/25/2019   Procedure: TRANSESOPHAGEAL ECHOCARDIOGRAM (TEE);  Surgeon: Prescott Gum, Collier Salina, MD;  Location: [place];  Service: Open Heart Surgery;  Laterality: N/A;   TEE WITHOUT CARDIOVERSION N/A 10/30/2019   Procedure: TRANSESOPHAGEAL ECHOCARDIOGRAM (TEE);  Surgeon: Prescott Gum, Collier Salina, MD;  Location: [place];  Service: Open Heart Surgery;  Laterality: N/A;   TEE WITHOUT CARDIOVERSION N/A 11/03/2019   Procedure: TRANSESOPHAGEAL ECHOCARDIOGRAM (TEE);  Surgeon: Prescott Gum, Collier Salina, MD;  Location: [place];  Service: Open Heart Surgery;  Laterality: N/A;   Past Surgical History:  Procedure Laterality Date   APPLICATION OF WOUND VAC  10/25/2019   Procedure: Application Of Wound Vac;  Surgeon: [name], MD;  Location: [place];  Service: Open Heart Surgery;;   CANNULATION FOR ECMO (EXTRACORPOREAL MEMBRANE OXYGENATION)  10/25/2019   Procedure: Cannulation For Ecmo (Extracorporeal Membrane Oxygenation) @ 8185;  Surgeon: [name], MD;  Location: [place];  Service: Open Heart Surgery;;   CANNULATION FOR ECMO (EXTRACORPOREAL MEMBRANE OXYGENATION) N/A 10/30/2019   Procedure: DECANNULATION OF ECMO (EXTRACORPOREAL MEMBRANE OXYGENATION);  Surgeon: Prescott Gum, Collier Salina, MD;  Location: [place];  Service: Open Heart Surgery;  Laterality: N/A;  Pump standby   CHEST EXPLORATION  10/25/2019   Procedure: Chest Exploration for repair of coronary perforation (repair of LAD);  Surgeon: Prescott Gum, Collier Salina, MD;  Location: [place];  Service: Open Heart Surgery;;   CORONARY CTO INTERVENTION N/A 10/25/2019   Procedure: CORONARY CTO INTERVENTION;   Surgeon: Martinique, Peter M, MD;  Location: [place] [facility];  Service: Cardiovascular;  Laterality: N/A;   CORONARY STENT INTERVENTION N/A 10/17/2019   Procedure: CORONARY STENT INTERVENTION;  Surgeon: Sherren Mocha, MD;  Location: [place] [facility];  Service: Cardiovascular;  Laterality: N/A;   LEFT HEART CATH AND CORONARY ANGIOGRAPHY N/A 10/17/2019   Procedure: LEFT HEART CATH AND CORONARY ANGIOGRAPHY;  Surgeon: Sherren Mocha, MD;  Location: [place] [facility];  Service: Cardiovascular;  Laterality: N/A;   [place]  10/25/2019   Procedure: Median Sternotomy.;  Surgeon: [name], MD;  Location[facility] OR;  Service: Open Heart Surgery;;   PLACEMENT OF IMPELLA LEFT VENTRICULAR ASSIST DEVICE  10/30/2019   Procedure: Placement Of Impella Left Ventricular Assist Device 5.5 USING 10MM HEMASHIELD PLATINUM GRAFT;  Surgeon: [name], MD;  Location: [place];  Service: Open Heart Surgery;;  graft placed into aorta   REMOVAL OF IMPELLA LEFT VENTRICULAR ASSIST DEVICE N/A 11/03/2019   Procedure: REMOVAL OF IMPELLA 5.5 LEFT VENTRICULAR ASSIST DEVICE;  Surgeon: [name], MD;  Location: [place];  Service: Open Heart Surgery;  Laterality: N/A;   STERNAL CLOSURE N/A 10/30/2019   Procedure: Sternal Closure;  Surgeon: [name], MD;  Location: [place];  Service: Open Heart Surgery;  Laterality: N/A;   STERNAL WIRES REMOVAL N/A 05/14/2020   Procedure: STERNAL WIRES REMOVAL;  Surgeon: [name], MD;  Location: [place];  Service: Thoracic;  Laterality: N/A;   TEE WITHOUT CARDIOVERSION N/A 10/25/2019   Procedure: TRANSESOPHAGEAL ECHOCARDIOGRAM (TEE);  Surgeon: Prescott Gum, Collier Salina, MD;  Location: [place];  Service: Open Heart Surgery;  Laterality: N/A;   TEE WITHOUT CARDIOVERSION N/A 10/30/2019   Procedure: TRANSESOPHAGEAL ECHOCARDIOGRAM (TEE);  Surgeon: Prescott Gum, Collier Salina, MD;  Location: [place];  Service: Open Heart Surgery;  Laterality: N/A;   TEE WITHOUT CARDIOVERSION N/A 11/03/2019   Procedure:  TRANSESOPHAGEAL ECHOCARDIOGRAM (TEE);  Surgeon: Prescott Gum, Collier Salina, MD;  Location: [place];  Service: Open Heart Surgery;  Laterality: N/A;   Past Medical History:  Diagnosis Date   Alcohol abuse    Anxiety    CAD (coronary artery disease)    Cardiac arrest ([place])    Cardiogenic shock (HCC)    CHF (congestive heart failure) (HCC)    Cough    Dysrhythmia    GERD (gastroesophageal reflux disease)    HFrEF (heart failure with reduced ejection fraction) (HCC)    Hypertension    Hypertriglyceridemia    Ischemic cardiomyopathy    Right atrial thrombus    Tobacco abuse    There were no vitals taken for this visit.  Opioid Risk Score:   Fall Risk Score:  `1  Depression screen PHQ 2/9  Depression screen [facility] 2/9 03/28/2021 03/10/2021 11/22/2020 10/25/2020 09/25/2020 09/03/2020 08/28/2020  Decreased Interest 0 0 0 0 0 0 0  Down, Depressed, Hopeless 0 0 0 0 0 0 0  PHQ - 2 Score 0 0 0 0 0 0 0  Altered sleeping - - - - - - -  Tired, decreased energy - - - - - - -  Change in appetite - - - - - - -  Feeling bad or failure about yourself  - - - - - - -  Trouble concentrating - - - - - - -  Moving slowly or fidgety/restless - - - - - - -  Suicidal thoughts - - - - - - -  PHQ-9 Score - - - - - - -  Difficult doing work/chores - - - - - - -     Review of Systems  Constitutional: Negative.   HENT: Negative.    Eyes: Negative.   Respiratory: Negative.  Cardiovascular: Negative.   Gastrointestinal: Negative.   Endocrine: Negative.   Genitourinary: Negative.   Musculoskeletal:  Positive for arthralgias, back pain and myalgias.  Skin: Negative.   Allergic/Immunologic: Negative.   Neurological:        Tingling  Hematological:  Bruises/bleeds easily.       Plavix  Psychiatric/Behavioral: Negative.    All other systems reviewed and are negative.     Objective:    Not performed as patient was seen via phone.       Assessment & Plan:  1. Left Shoulder Pain: Continue HEP as Tolerated.  Continue current medication regimen. Continue to monitor. 11/22/2020  2.Left Greater Trochanter Bursitis: S/P Left Hip Injection with Dr Ranell Patrick on 09/03/2020, no relief noted he reports, Continue to alternate with Heat an Ice Therapy. Continue to monitor. 11/22/2020  3. Bilateral  Knees chondral defects: Continue HEP as Tolerated. Continue current medication regimen. 11/22/2020. Discussed results of bilateral knee MRIs which showed chondral deficits. Advised application of voltaren gel. Advised icing in evenings. Did not benefit from prior steroid taper. Referred to ortho for eval for arthroscopic debridement.  -discussed that pain could be secondary to opioid induced testoserone deficiency -testosterone level checked and it is is low. -Discussed the risks and benefits of testosterone replacement therapy and he is interested in trying this  -PSA should be monitored while using testosterone replacement therapy as there is an increased risk of prostate cancer.  -recommended Wobenxyme  3. Chest Wall Discomfort/ Neuropathic Pain: Continue Gabapentin. Continue to Monitor. 11/22/2020. Prescribed lidocaine patch.  -Cymbalta 64m daily prescribed -Discussed Qutenza as an option for neuropathic pain control. Discussed that this is a capsaicin patch, stronger than capsaicin cream. Discussed that it is currently approved for diabetic peripheral neuropathy and post-herpetic neuralgia, but that it has also shown benefit in treating other forms of neuropathy. Provided patient with link to site to learn more about the patch: hCinemaBonus.fr Discussed that the patch would be placed in office and benefits usually last 3 months. Discussed that unintended exposure to capsaicin can cause severe irritation of eyes, mucous membranes, respiratory tract, and skin, but that Qutenza is a local treatment and does not have the systemic side effects of other nerve medications. Discussed that there may be pain, itching,  erythema, and decreased sensory function associated with the application of Qutenza. Side effects usually subside within 1 week. A cold pack of analgesic medications can help with these side effects. Blood pressure can also be increased due to pain associated with administration of the patch.  2 patches of Qutenza was applied to the areas of pain. Ice packs were applied during the procedure to ensure patient comfort. Blood pressure was monitored every 15 minutes. The patient tolerated the procedure well. Post-procedure instructions were given and follow-up has been scheduled.    -4. Chronic Pain Syndrome: :  Continue Oxycodone 10/325 mg 6 times a day as needed for pain #180. We will continue the opioid monitoring program, this consists of regular clinic visits, examinations, urine drug screen, pill counts [place] well [place]. A 12 month [place] 11[place]22/2020. Discussed risks and benefits of opioid medications.  -Provided with a pain relief journal and discussed that it contains foods and lifestyle tips to naturally help to improve pain. Discussed that these lifestyle strategies are also very good for health unlike some medications which can have negative side effects. Discussed that  the act of keeping a journal can be therapeutic and helpful to realize patterns what helps to trigger and alleviate pain.   -Discussed following foods that may reduce pain: 1) Ginger (especially studied for arthritis)- reduce leukotriene production to decrease inflammation 2) Blueberries- high in phytonutrients that decrease inflammation 3) Salmon- marine omega-3s reduce joint swelling and pain 4) Pumpkin seeds- reduce inflammation 5) dark chocolate- reduces inflammation 6) turmeric- reduces inflammation 7) tart cherries - reduce pain and stiffness 8) extra virgin olive oil - its compound  olecanthal helps to block prostaglandins  9) chili peppers- can be eaten or applied topically via capsaicin 10) mint- helpful for headache, muscle aches, joint pain, and itching 11) garlic- reduces inflammation  Link to further information on diet for chronic pain: http://www.randall.com/   5) Insomnia: prescribed trazodone 22m HS  6) Low back pain: -Discussed results of MRI which shows disc degeneration -discussed that muscle and ligament pathology can also cause severe pain at times -encouraged continued exercise and working to send blood flow to injured area and promote healing -Provided with a pain relief journal and discussed that it contains foods and lifestyle tips to naturally help to improve pain. Discussed that these lifestyle strategies are also very good for health unlike some medications which can have negative side effects. Discussed that the act of keeping a journal can be therapeutic and helpful to realize patterns what helps to trigger and alleviate pain.   -Wobenzyme  7) Cervical radiuculitis: -Discussed that cervical XR showed disc degeneration  8) Testosterone deficiency See #3 and 6 Prescribed 260mpatch to be applied daily at night- discussed to alternate sites between arms, thighs, and belly. Unfortunately not covered by insurance, so discussed compounding and the over the counter supplement.  Discussed risks and benefits of supplementation PSA checked and 0.4. Discussed that we should repeat in 3 weeks to ensure there is no increase  10 minutes spent in discussion of his knee pain, low testosterone levels, giving blood

## 2021-04-29 ENCOUNTER — Other Ambulatory Visit: Payer: Self-pay | Admitting: Physical Medicine and Rehabilitation

## 2021-05-02 ENCOUNTER — Other Ambulatory Visit: Payer: Self-pay

## 2021-05-02 ENCOUNTER — Encounter: Payer: Self-pay | Admitting: Registered Nurse

## 2021-05-02 ENCOUNTER — Encounter: Payer: BC Managed Care – PPO | Admitting: Registered Nurse

## 2021-05-02 VITALS — BP 131/80 | HR 71 | Temp 98.3°F | Ht 68.0 in | Wt 186.0 lb

## 2021-05-02 DIAGNOSIS — Z79899 Other long term (current) drug therapy: Secondary | ICD-10-CM | POA: Diagnosis present

## 2021-05-02 DIAGNOSIS — G8929 Other chronic pain: Secondary | ICD-10-CM

## 2021-05-02 DIAGNOSIS — G894 Chronic pain syndrome: Secondary | ICD-10-CM | POA: Diagnosis present

## 2021-05-02 DIAGNOSIS — M170 Bilateral primary osteoarthritis of knee: Secondary | ICD-10-CM | POA: Diagnosis present

## 2021-05-02 DIAGNOSIS — E349 Endocrine disorder, unspecified: Secondary | ICD-10-CM | POA: Diagnosis present

## 2021-05-02 DIAGNOSIS — Z5181 Encounter for therapeutic drug level monitoring: Secondary | ICD-10-CM

## 2021-05-02 DIAGNOSIS — M545 Low back pain, unspecified: Secondary | ICD-10-CM | POA: Diagnosis present

## 2021-05-02 MED ORDER — OXYCODONE-ACETAMINOPHEN 10-325 MG PO TABS: 1.0000 | ORAL_TABLET | ORAL | 0 refills | Status: DC | PRN

## 2021-05-02 NOTE — Progress Notes (Signed)
Subjective:    Patient ID: William Crawford, male    DOB: Jan 13, 1971, 50 y.o.   MRN: [id number]  HPI: William Crawford is a 50 y.o. male who returns for follow up appointment for chronic pain and medication refill. He states his pain is located in his lower back and bilateral knee pain. He rates his pain 6. His current exercise regime is walking and performing stretching exercises.  Mr. [place] Morphine equivalent is 90.00 MME.   Last UDS was Performed on 12/27/2020, it was consistent.    Pain Inventory Average Pain 10 Pain Right Now 6 My pain is sharp, burning, and stabbing  In the last 24 hours, has pain interfered with the following? General activity 8 Relation with others 8 Enjoyment of life 8 What TIME of day is your pain at its worst? morning , daytime, and night Sleep (in general) Fair  Pain is worse with: walking, bending, sitting, and standing Pain improves with: medication Relief from Meds: 8  Family History  Problem Relation Age of Onset   Heart attack Father    Heart disease Father    Heart attack Paternal Grandfather    Heart disease Paternal Grandfather    Heart attack Paternal Uncle    Heart disease Paternal Uncle    Heart attack Paternal Uncle    Heart disease Paternal Uncle    Social History   Socioeconomic History   Marital status: Single    Spouse name: Not on file   Number of children: Not on file   Years of education: Not on file   Highest education level: Not on file  Occupational History   Not on file  Tobacco Use   Smoking status: Former    Types: Cigarettes    Quit date: 10/23/2019    Years since quitting: 1.5   Smokeless tobacco: Never  Vaping Use   Vaping Use: Never used  Substance and Sexual Activity   Alcohol use: Not Currently    Comment: QUIT  IN MAY 40981   Drug use: No   Sexual activity: Not on file  Other Topics Concern   Not on file  Social History Narrative   Not on file   Social Determinants of Health   Financial  Resource Strain: Not on file  Food Insecurity: Not on file  Transportation Needs: Not on file  Physical Activity: Not on file  Stress: Not on file  Social Connections: Not on file   Past Surgical History:  Procedure Laterality Date   APPLICATION OF WOUND VAC  10/25/2019   Procedure: Application Of Wound Vac;  Surgeon: [name], MD;  Location: [place];  Service: Open Heart Surgery;;   CANNULATION FOR ECMO (EXTRACORPOREAL MEMBRANE OXYGENATION)  10/25/2019   Procedure: Cannulation For Ecmo (Extracorporeal Membrane Oxygenation) @ 1531;  Surgeon: Prescott Gum, Collier Salina, MD;  Location: [place];  Service: Open Heart Surgery;;   CANNULATION FOR ECMO (EXTRACORPOREAL MEMBRANE OXYGENATION) N/A 10/30/2019   Procedure: DECANNULATION OF ECMO (EXTRACORPOREAL MEMBRANE OXYGENATION);  Surgeon: Prescott Gum, Collier Salina, MD;  Location: [place];  Service: Open Heart Surgery;  Laterality: N/A;  Pump standby   CHEST EXPLORATION  10/25/2019   Procedure: Chest Exploration for repair of coronary perforation (repair of LAD);  Surgeon: Prescott Gum, Collier Salina, MD;  Location: [place];  Service: Open Heart Surgery;;   CORONARY CTO INTERVENTION N/A 10/25/2019   Procedure: CORONARY CTO INTERVENTION;  Surgeon: Martinique, Peter M, MD;  Location: [facility] [facility];  Service: Cardiovascular;  Laterality:  N/A;   CORONARY STENT INTERVENTION N/A 10/17/2019   Procedure: CORONARY STENT INTERVENTION;  Surgeon: Sherren Mocha, MD;  Location: [place] [facility];  Service: Cardiovascular;  Laterality: N/A;   LEFT HEART CATH AND CORONARY ANGIOGRAPHY N/A 10/17/2019   Procedure: LEFT HEART CATH AND CORONARY ANGIOGRAPHY;  Surgeon: Sherren Mocha, MD;  Location: [place] [facility];  Service: Cardiovascular;  Laterality: N/A;   [place]  10/25/2019   Procedure: Median Sternotomy.;  Surgeon: [name], MD;  Location: [facility];  Service: Open Heart Surgery;;   PLACEMENT OF IMPELLA LEFT VENTRICULAR ASSIST DEVICE  10/30/2019   Procedure: Placement Of Impella Left  Ventricular Assist Device 5.5 USING 10MM HEMASHIELD PLATINUM GRAFT;  Surgeon: [name], MD;  Location: [place];  Service: Open Heart Surgery;;  graft placed into aorta   REMOVAL OF IMPELLA LEFT VENTRICULAR ASSIST DEVICE N/A 11/03/2019   Procedure: REMOVAL OF IMPELLA 5.5 LEFT VENTRICULAR ASSIST DEVICE;  Surgeon: [name], MD;  Location: [place];  Service: Open Heart Surgery;  Laterality: N/A;   STERNAL CLOSURE N/A 10/30/2019   Procedure: Sternal Closure;  Surgeon: [name], MD;  Location: [place];  Service: Open Heart Surgery;  Laterality: N/A;   STERNAL WIRES REMOVAL N/A 05/14/2020   Procedure: STERNAL WIRES REMOVAL;  Surgeon: [name], MD;  Location: [place];  Service: Thoracic;  Laterality: N/A;   TEE WITHOUT CARDIOVERSION N/A 10/25/2019   Procedure: TRANSESOPHAGEAL ECHOCARDIOGRAM (TEE);  Surgeon: Prescott Gum, Collier Salina, MD;  Location: [place];  Service: Open Heart Surgery;  Laterality: N/A;   TEE WITHOUT CARDIOVERSION N/A 10/30/2019   Procedure: TRANSESOPHAGEAL ECHOCARDIOGRAM (TEE);  Surgeon: Prescott Gum, Collier Salina, MD;  Location: [place];  Service: Open Heart Surgery;  Laterality: N/A;   TEE WITHOUT CARDIOVERSION N/A 11/03/2019   Procedure: TRANSESOPHAGEAL ECHOCARDIOGRAM (TEE);  Surgeon: Prescott Gum, Collier Salina, MD;  Location: [place];  Service: Open Heart Surgery;  Laterality: N/A;   Past Surgical History:  Procedure Laterality Date   APPLICATION OF WOUND VAC  10/25/2019   Procedure: Application Of Wound Vac;  Surgeon: [name], MD;  Location: [place];  Service: Open Heart Surgery;;   CANNULATION FOR ECMO (EXTRACORPOREAL MEMBRANE OXYGENATION)  10/25/2019   Procedure: Cannulation For Ecmo (Extracorporeal Membrane Oxygenation) @ 8676;  Surgeon: [name], MD;  Location: [place];  Service: Open Heart Surgery;;   CANNULATION FOR ECMO (EXTRACORPOREAL MEMBRANE OXYGENATION) N/A 10/30/2019   Procedure: DECANNULATION OF ECMO (EXTRACORPOREAL MEMBRANE OXYGENATION);  Surgeon: Prescott Gum, Collier Salina, MD;   Location: [place];  Service: Open Heart Surgery;  Laterality: N/A;  Pump standby   CHEST EXPLORATION  10/25/2019   Procedure: Chest Exploration for repair of coronary perforation (repair of LAD);  Surgeon: Prescott Gum, Collier Salina, MD;  Location: [place];  Service: Open Heart Surgery;;   CORONARY CTO INTERVENTION N/A 10/25/2019   Procedure: CORONARY CTO INTERVENTION;  Surgeon: Martinique, Peter M, MD;  Location: [place] [facility];  Service: Cardiovascular;  Laterality: N/A;   CORONARY STENT INTERVENTION N/A 10/17/2019   Procedure: CORONARY STENT INTERVENTION;  Surgeon: Sherren Mocha, MD;  Location: [place] [facility];  Service: Cardiovascular;  Laterality: N/A;   LEFT HEART CATH AND CORONARY ANGIOGRAPHY N/A 10/17/2019   Procedure: LEFT HEART CATH AND CORONARY ANGIOGRAPHY;  Surgeon: Sherren Mocha, MD;  Location: [place] [facility];  Service: Cardiovascular;  Laterality: N/A;   MEDIASTERNOTOMY  10/25/2019   Procedure: Median Sternotomy.;  Surgeon: [name], MD;  Location: [place];  Service:  Open Heart Surgery;;   PLACEMENT OF IMPELLA LEFT VENTRICULAR ASSIST DEVICE  10/30/2019   Procedure: Placement Of Impella Left Ventricular Assist Device 5.5 USING 10MM HEMASHIELD PLATINUM GRAFT;  Surgeon: [name], MD;  Location: [place];  Service: Open Heart Surgery;;  graft placed into aorta   REMOVAL OF IMPELLA LEFT VENTRICULAR ASSIST DEVICE N/A 11/03/2019   Procedure: REMOVAL OF IMPELLA 5.5 LEFT VENTRICULAR ASSIST DEVICE;  Surgeon: [name], MD;  Location: [place];  Service: Open Heart Surgery;  Laterality: N/A;   STERNAL CLOSURE N/A 10/30/2019   Procedure: Sternal Closure;  Surgeon: [name], MD;  Location: [place];  Service: Open Heart Surgery;  Laterality: N/A;   STERNAL WIRES REMOVAL N/A 05/14/2020   Procedure: STERNAL WIRES REMOVAL;  Surgeon: [name], MD;  Location: [facility];  Service: Thoracic;  Laterality: N/A;   TEE WITHOUT CARDIOVERSION N/A 10/25/2019   Procedure: TRANSESOPHAGEAL ECHOCARDIOGRAM  (TEE);  Surgeon: Prescott Gum, Collier Salina, MD;  Location: [place];  Service: Open Heart Surgery;  Laterality: N/A;   TEE WITHOUT CARDIOVERSION N/A 10/30/2019   Procedure: TRANSESOPHAGEAL ECHOCARDIOGRAM (TEE);  Surgeon: Prescott Gum, Collier Salina, MD;  Location: [place];  Service: Open Heart Surgery;  Laterality: N/A;   TEE WITHOUT CARDIOVERSION N/A 11/03/2019   Procedure: TRANSESOPHAGEAL ECHOCARDIOGRAM (TEE);  Surgeon: Prescott Gum, Collier Salina, MD;  Location: [place];  Service: Open Heart Surgery;  Laterality: N/A;   Past Medical History:  Diagnosis Date   Alcohol abuse    Anxiety    CAD (coronary artery disease)    Cardiac arrest ([place])    Cardiogenic shock (HCC)    CHF (congestive heart failure) (HCC)    Cough    Dysrhythmia    GERD (gastroesophageal reflux disease)    HFrEF (heart failure with reduced ejection fraction) (HCC)    Hypertension    Hypertriglyceridemia    Ischemic cardiomyopathy    Right atrial thrombus    Tobacco abuse    BP 131/80   Pulse 71   Temp 98.3 F (36.8 C) (Oral)   Ht 5' 8"  (1.727 m)   Wt 186 lb (84.4 kg)   SpO2 98%   BMI 28.28 kg/m   Opioid Risk Score:   Fall Risk Score:  `1  Depression screen PHQ 2/9  Depression screen [facility] 2/9 03/28/2021 03/10/2021 11/22/2020 10/25/2020 09/25/2020 09/03/2020 08/28/2020  Decreased Interest 0 0 0 0 0 0 0  Down, Depressed, Hopeless 0 0 0 0 0 0 0  PHQ - 2 Score 0 0 0 0 0 0 0  Altered sleeping - - - - - - -  Tired, decreased energy - - - - - - -  Change in appetite - - - - - - -  Feeling bad or failure about yourself  - - - - - - -  Trouble concentrating - - - - - - -  Moving slowly or fidgety/restless - - - - - - -  Suicidal thoughts - - - - - - -  PHQ-9 Score - - - - - - -  Difficult doing work/chores - - - - - - -       Review of Systems  Constitutional: Negative.   HENT: Negative.    Eyes: Negative.   Respiratory: Negative.    Cardiovascular: Negative.   Endocrine: Negative.   Genitourinary: Negative.   Musculoskeletal:        Pain in  both knees , pain in left thumb, pain in left shoulder  Skin: Negative.   Allergic/Immunologic: Negative.   Neurological: Negative.   Hematological: Negative.   Psychiatric/Behavioral: Negative.        Objective:   Physical Exam Vitals and nursing note reviewed.  Constitutional:      Appearance: Normal appearance.  Cardiovascular:     Rate and Rhythm: Normal rate and regular rhythm.     Pulses: Normal pulses.     Heart sounds: Normal heart sounds.  Pulmonary:     Effort: Pulmonary effort is normal.     Breath sounds: Normal breath sounds.  Musculoskeletal:     Cervical back: Normal range of motion and neck supple.     Comments: Normal Muscle Bulk and Muscle Testing Reveals:  Upper Extremities: Full ROM and Muscle Strength 5/5 Lumbar Paraspinal Tenderness: L-3-L-5 Lower Extremities: Full ROM and Muscle Strength 5/5 Arises from Table with ease Narrow Based Gait     Skin:    General: Skin is warm and dry.  Neurological:     Mental Status: He is alert and oriented to person, place, and time.  Psychiatric:        Mood and Affect: Mood normal.        Behavior: Behavior normal.         Assessment & Plan:  1. Left Shoulder Pain: No complaints today. Continue HEP as Tolerated. Continue current medication regimen. Continue to monitor. 05/02/2021 2.Left Greater Trochanter Bursitis: No complaints today. S/P Left Hip Injection with Dr Ranell Patrick on 09/03/2020, no relief noted he reports, Continue to alternate with Heat an Ice Therapy. Continue to monitor. 05/02/2021 3. Bilateral  Knees OA: Continue HEP as Tolerated. Continue current medication regimen. 05/02/2021 3. Chest Wall Discomfort/ Neuropathic Pain:  Continue Gabapentin. Continue to Monitor. 05/02/2021 4.Chronic Low Back Pain without sciatica: Continue HEP and Continue current medication regimen. Continue to monitor.  4. Chronic Pain Syndrome: : Refilled: Oxycodone 10/325 mg one tablet every 4 hours  as needed for pain #150. We  will continue the opioid monitoring program, this consists of regular clinic visits, examinations, urine drug screen, pill counts as well as use of [place] Controlled Substance Reporting system. A 12 month History has been reviewed on the [place] Controlled Substance Reporting System on 05/02/2021.   F/U in 1 month

## 2021-05-09 ENCOUNTER — Ambulatory Visit (HOSPITAL_COMMUNITY): Payer: BC Managed Care – PPO | Attending: Orthopaedic Surgery

## 2021-05-09 ENCOUNTER — Other Ambulatory Visit: Payer: Self-pay

## 2021-05-09 DIAGNOSIS — M25562 Pain in left knee: Secondary | ICD-10-CM | POA: Diagnosis present

## 2021-05-09 DIAGNOSIS — M1712 Unilateral primary osteoarthritis, left knee: Secondary | ICD-10-CM | POA: Diagnosis not present

## 2021-05-09 DIAGNOSIS — M25561 Pain in right knee: Secondary | ICD-10-CM | POA: Diagnosis present

## 2021-05-09 DIAGNOSIS — M1711 Unilateral primary osteoarthritis, right knee: Secondary | ICD-10-CM | POA: Insufficient documentation

## 2021-05-09 NOTE — Therapy (Signed)
[place], [place], [address] Phone: [phone]   Fax:  [phone]  Physical Therapy Evaluation  Patient Details  Name: William Crawford MRN: [id number] Date of Birth: [birth year] Referring Provider (PT): Leandrew Koyanagi, MD   Encounter Date: 05/09/2021   PT End of Session - 05/09/21 1614     Visit Number 1    Number of Visits 3    Date for PT Re-Evaluation 06/20/21    Authorization Type BCBS other,    PT Start Time 1600    PT Stop Time 1645    PT Time Calculation (min) 45 min    Activity Tolerance Patient tolerated treatment well    Behavior During Therapy [facility] for tasks assessed/performed             Past Medical History:  Diagnosis Date   Alcohol abuse    Anxiety    CAD (coronary artery disease)    Cardiac arrest ([place])    Cardiogenic shock ([place])    CHF (congestive heart failure) (HCC)    Cough    Dysrhythmia    GERD (gastroesophageal reflux disease)    HFrEF (heart failure with reduced ejection fraction) ([place])    Hypertension    Hypertriglyceridemia    Ischemic cardiomyopathy    Right atrial thrombus    Tobacco abuse     Past Surgical History:  Procedure Laterality Date   APPLICATION OF WOUND VAC  10/25/2019   Procedure: Application Of Wound Vac;  Surgeon: [name], MD;  Location: [place];  Service: Open Heart Surgery;;   CANNULATION FOR ECMO (EXTRACORPOREAL MEMBRANE OXYGENATION)  10/25/2019   Procedure: Cannulation For Ecmo (Extracorporeal Membrane Oxygenation) @ 5427;  Surgeon: [name], MD;  Location: [place];  Service: Open Heart Surgery;;   CANNULATION FOR ECMO (EXTRACORPOREAL MEMBRANE OXYGENATION) N/A 10/30/2019   Procedure: DECANNULATION OF ECMO (EXTRACORPOREAL MEMBRANE OXYGENATION);  Surgeon: Prescott Gum, Collier Salina, MD;  Location: [place];  Service: Open Heart Surgery;  Laterality: N/A;  Pump standby   CHEST EXPLORATION  10/25/2019   Procedure: Chest Exploration for repair of coronary  perforation (repair of LAD);  Surgeon: Prescott Gum, Collier Salina, MD;  Location: [place];  Service: Open Heart Surgery;;   CORONARY CTO INTERVENTION N/A 10/25/2019   Procedure: CORONARY CTO INTERVENTION;  Surgeon: Martinique, Peter M, MD;  Location: [place] [facility];  Service: Cardiovascular;  Laterality: N/A;   CORONARY STENT INTERVENTION N/A 10/17/2019   Procedure: CORONARY STENT INTERVENTION;  Surgeon: Sherren Mocha, MD;  Location: [place] [facility];  Service: Cardiovascular;  Laterality: N/A;   LEFT HEART CATH AND CORONARY ANGIOGRAPHY N/A 10/17/2019   Procedure: LEFT HEART CATH AND CORONARY ANGIOGRAPHY;  Surgeon: Sherren Mocha, MD;  Location: [place] [facility];  Service: Cardiovascular;  Laterality: N/A;   [place]  10/25/2019   Procedure: Median Sternotomy.;  Surgeon: [name], MD;  [facility];  Service: Open Heart Surgery;;   PLACEMENT OF IMPELLA LEFT VENTRICULAR ASSIST DEVICE  10/30/2019   Procedure: Placement Of Impella Left Ventricular Assist Device 5.5 USING 10MM HEMASHIELD PLATINUM GRAFT;  Surgeon: [name], MD;  Location: [place];  Service: Open Heart Surgery;;  graft placed into aorta   REMOVAL OF IMPELLA LEFT VENTRICULAR ASSIST DEVICE N/A 11/03/2019   Procedure: REMOVAL OF IMPELLA 5.5 LEFT VENTRICULAR ASSIST DEVICE;  Surgeon: [name], MD;  Location: [place];  Service: Open Heart Surgery;  Laterality: N/A;   STERNAL CLOSURE N/A  10/30/2019   Procedure: Sternal Closure;  Surgeon: [name], MD;  Location: [place];  Service: Open Heart Surgery;  Laterality: N/A;   STERNAL WIRES REMOVAL N/A 05/14/2020   Procedure: STERNAL WIRES REMOVAL;  Surgeon: [name], MD;  Location: [place];  Service: Thoracic;  Laterality: N/A;   TEE WITHOUT CARDIOVERSION N/A 10/25/2019   Procedure: TRANSESOPHAGEAL ECHOCARDIOGRAM (TEE);  Surgeon: Prescott Gum, Collier Salina, MD;  Location: [place];  Service: Open Heart Surgery;  Laterality: N/A;   TEE WITHOUT CARDIOVERSION N/A 10/30/2019   Procedure:  TRANSESOPHAGEAL ECHOCARDIOGRAM (TEE);  Surgeon: Prescott Gum, Collier Salina, MD;  Location: [place];  Service: Open Heart Surgery;  Laterality: N/A;   TEE WITHOUT CARDIOVERSION N/A 11/03/2019   Procedure: TRANSESOPHAGEAL ECHOCARDIOGRAM (TEE);  Surgeon: Prescott Gum, Collier Salina, MD;  Location: [place];  Service: Open Heart Surgery;  Laterality: N/A;    There were no vitals filed for this visit.    Subjective Assessment - 05/09/21 1619     Subjective Pt with gradual onset of bilateral knee pain and has had imaging performed which reveals "arthritis under my knee cap".  Pt works as a Curator and notes that he is having increased difficulty with negotiating stairs and climbing ladders.    Currently in Pain? Yes    Pain Score 5     Pain Location Knee    Pain Orientation Right;Left;Anterior    Pain Descriptors / Indicators Aching;Sore    Pain Type Chronic pain    Pain Onset More than a month ago    Pain Frequency Intermittent    Aggravating Factors  ladders, squatting, kneeling, crawling                [facility] PT Assessment - 05/09/21 0001       Assessment   Medical Diagnosis Bilateral knee OA    Referring Provider (PT) Leandrew Koyanagi, MD      Balance Screen   Has the patient fallen in the past 6 months No    Has the patient had a decrease in activity level because of a fear of falling?  No    Is the patient reluctant to leave their home because of a fear of falling?  No      Home Ecologist residence      Prior Function   Level of Independence Independent    Vocation Full time employment    Vocation Requirements Painter: ladders, kneeling, squatting, crawling, etc      Observation/Other Assessments   Observations good patellar tracking.      Coordination   Gross Motor Movements are Fluid and Coordinated Yes      Functional Tests   Functional tests Squat;Single leg stance      Squat   Comments good form, unable to achieve parallel      Single Leg Stance    Comments no issue      Posture/Postural Control   Posture/Postural Control No significant limitations      ROM / Strength   AROM / PROM / Strength AROM;Strength      AROM   Overall AROM  Within functional limits for tasks performed      Strength   Overall Strength Within functional limits for tasks performed      Flexibility   Soft Tissue Assessment /Muscle Length yes    Quadriceps three fists-width from heel to buttocks      Palpation   Patella mobility good alignment, no tilt or tracking  issues appreciated                        Objective measurements completed on examination: See above findings.                PT Education - 05/09/21 1716     Education Details pt educated extensively regarding chrondromalacia patella and rationale for activity/work duty modification to reduce compressive force on patella, e.g. platform for ladders, rolling stool for ground work, benefit of Quarry manager) Educated Patient    Methods Explanation;Demonstration;Handout    Comprehension Verbalized understanding;Need further instruction              PT Short Term Goals - 05/09/21 1722       PT SHORT TERM GOAL #1   Title Patient will be independent with HEP in order to improve functional outcomes.    Time 2    Period Weeks    Status New    Target Date 05/30/21               PT Long Term Goals - 05/09/21 1722       PT LONG TERM GOAL #1   Title Pt will be able to teach back the work place modification/adaptation to reduce knee pain    Time 4    Period Weeks    Status New    Target Date 06/20/21                    Plan - 05/09/21 1718     Clinical Impression Statement Pt is 50 yo male who presents with chronic bilateral patella pain which limits his occupation performance and tolerance.  Pt demonstrates some tightness of quadriceps and difficulty with body mechanics/control.  Pt would benefit from PT Services to  train/instruct in HEP, advise on activity modification, work duty compensation/adaptation    Personal Factors and Comorbidities Time since onset of injury/illness/exacerbation    Examination-Activity Limitations Squat;Stairs;Carry    Examination-Participation Restrictions Occupation    Stability/Clinical Decision Making Stable/Uncomplicated    Clinical Decision Making Low    Rehab Potential Good    PT Frequency 1x / week    PT Duration 3 weeks    PT Treatment/Interventions ADLs/Self Care Home Management;Aquatic Therapy;Electrical Stimulation;Gait training;Stair training;Functional mobility training;Therapeutic activities;Therapeutic exercise;Balance training;Patient/family education;Neuromuscular re-education;Manual techniques;Splinting;Taping    PT Next Visit Plan reinforce workplace modification/adaptation, HEP routine for knee OA    PT Home Exercise Plan chair squats, prone quad stretch    Consulted and Agree with Plan of Care Patient             Patient will benefit from skilled therapeutic intervention in order to improve the following deficits and impairments:  Pain, Improper body mechanics, Decreased activity tolerance  Visit Diagnosis: Pain in both knees, unspecified chronicity     Problem List Patient Active Problem List   Diagnosis Date Noted   Sternal fracture 05/29/2020   Protruding sternal wires 05/08/2020   Near syncope    Syncope 03/18/2020   Neuropathic pain 02/07/2020   Chest wall discomfort 01/17/2020   Encounter for post surgical wound check 01/03/2020   Post-op pain 12/20/2019   Encounter for postoperative wound check 12/13/2019   Personal history of ECMO 11/22/2019   Postop check 11/17/2019   Encounter for therapeutic drug monitoring 11/14/2019   Atrial thrombus 11/14/2019   Endotracheally intubated    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[facility])    Status  post cardiac surgery    Acute respiratory failure with hypoxia (HCC)    CAD (coronary  artery disease) 10/25/2019   Angina pectoris ([place]) 10/25/2019   Myocardial infarction acute ([place]) 10/25/2019   Cardiogenic shock ([place]) 10/25/2019   Tobacco use 10/18/2019   Ischemic cardiomyopathy 10/18/2019   Hypertension 10/18/2019   Hyperlipemia 10/18/2019   Alcohol abuse 10/18/2019   NSTEMI (non-ST elevated myocardial infarction) ([place]) 10/17/2019    Toniann Fail, PT 05/09/2021, 5:25 PM  [place] [address] [place], [place], [address] Phone: [phone]   Fax:  [phone]  Name: William Crawford MRN: [id number] Date of Birth: 1970-10-05

## 2021-05-27 ENCOUNTER — Encounter: Payer: Self-pay | Admitting: Registered Nurse

## 2021-05-27 ENCOUNTER — Other Ambulatory Visit: Payer: Self-pay

## 2021-05-27 ENCOUNTER — Encounter: Payer: BC Managed Care – PPO | Attending: Physical Medicine and Rehabilitation | Admitting: Registered Nurse

## 2021-05-27 VITALS — BP 113/72 | HR 76 | Ht 68.0 in | Wt 186.6 lb

## 2021-05-27 DIAGNOSIS — G8929 Other chronic pain: Secondary | ICD-10-CM

## 2021-05-27 DIAGNOSIS — M545 Low back pain, unspecified: Secondary | ICD-10-CM

## 2021-05-27 DIAGNOSIS — M170 Bilateral primary osteoarthritis of knee: Secondary | ICD-10-CM | POA: Diagnosis present

## 2021-05-27 DIAGNOSIS — M2550 Pain in unspecified joint: Secondary | ICD-10-CM

## 2021-05-27 DIAGNOSIS — Z5181 Encounter for therapeutic drug level monitoring: Secondary | ICD-10-CM | POA: Diagnosis present

## 2021-05-27 DIAGNOSIS — G894 Chronic pain syndrome: Secondary | ICD-10-CM | POA: Diagnosis present

## 2021-05-27 DIAGNOSIS — Z79899 Other long term (current) drug therapy: Secondary | ICD-10-CM

## 2021-05-27 MED ORDER — OXYCODONE-ACETAMINOPHEN 10-325 MG PO TABS: 1.0000 | ORAL_TABLET | ORAL | 0 refills | Status: DC | PRN

## 2021-05-27 NOTE — Progress Notes (Signed)
Subjective:    Patient ID: William Crawford, male    DOB: [birth year], 51 y.o.   MRN: [id number]  HPI: QUINTERIUS GAIDA is a 51 y.o. male who returns for follow up appointment for chronic pain and medication refill. He states his  pain is located in his lower back, bilateral knees and generalized joint pain. He rates his pain 7. His current exercise regime is walking, attending physical therapy weekly  and performing stretching exercises.  Mr. Tomei Morphine equivalent is 90.00 MME.   Last UDS was Performed on 12/27/2020, it was consistent.     Pain Inventory Average Pain 8 Pain Right Now 7 My pain is sharp, burning, stabbing, and aching  In the last 24 hours, has pain interfered with the following? General activity 8 Relation with others 5 Enjoyment of life 10 What TIME of day is your pain at its worst? morning , evening, and night Sleep (in general)   Pain is worse with: walking, bending, sitting, standing, and some activites Pain improves with: rest, heat/ice, and medication Relief from Meds: 7  Family History  Problem Relation Age of Onset   Heart attack Father    Heart disease Father    Heart attack Paternal Grandfather    Heart disease Paternal Grandfather    Heart attack Paternal Uncle    Heart disease Paternal Uncle    Heart attack Paternal Uncle    Heart disease Paternal Uncle    Social History   Socioeconomic History   Marital status: Single    Spouse name: Not on file   Number of children: Not on file   Years of education: Not on file   Highest education level: Not on file  Occupational History   Not on file  Tobacco Use   Smoking status: Former    Types: Cigarettes    Quit date: 10/23/2019    Years since quitting: 1.5   Smokeless tobacco: Never  Vaping Use   Vaping Use: Never used  Substance and Sexual Activity   Alcohol use: Not Currently    Comment: QUIT  IN MAY 42683   Drug use: No   Sexual activity: Not on file  Other Topics Concern    Not on file  Social History Narrative   Not on file   Social Determinants of Health   Financial Resource Strain: Not on file  Food Insecurity: Not on file  Transportation Needs: Not on file  Physical Activity: Not on file  Stress: Not on file  Social Connections: Not on file   Past Surgical History:  Procedure Laterality Date   APPLICATION OF WOUND VAC  10/25/2019   Procedure: Application Of Wound Vac;  Surgeon: [name], MD;  Location: [place];  Service: Open Heart Surgery;;   CANNULATION FOR ECMO (EXTRACORPOREAL MEMBRANE OXYGENATION)  10/25/2019   Procedure: Cannulation For Ecmo (Extracorporeal Membrane Oxygenation) @ 1531;  Surgeon: Prescott Gum, Collier Salina, MD;  Location: [place];  Service: Open Heart Surgery;;   CANNULATION FOR ECMO (EXTRACORPOREAL MEMBRANE OXYGENATION) N/A 10/30/2019   Procedure: DECANNULATION OF ECMO (EXTRACORPOREAL MEMBRANE OXYGENATION);  Surgeon: Prescott Gum, Collier Salina, MD;  Location: [place];  Service: Open Heart Surgery;  Laterality: N/A;  Pump standby   CHEST EXPLORATION  10/25/2019   Procedure: Chest Exploration for repair of coronary perforation (repair of LAD);  Surgeon: Prescott Gum, Collier Salina, MD;  Location: [place];  Service: Open Heart Surgery;;   CORONARY CTO INTERVENTION N/A 10/25/2019   Procedure: CORONARY CTO INTERVENTION;  Surgeon:  Martinique, Peter M, MD;  Location: [place] [facility];  Service: Cardiovascular;  Laterality: N/A;   CORONARY STENT INTERVENTION N/A 10/17/2019   Procedure: CORONARY STENT INTERVENTION;  Surgeon: Sherren Mocha, MD;  Location: [place] [facility];  Service: Cardiovascular;  Laterality: N/A;   LEFT HEART CATH AND CORONARY ANGIOGRAPHY N/A 10/17/2019   Procedure: LEFT HEART CATH AND CORONARY ANGIOGRAPHY;  Surgeon: Sherren Mocha, MD;  Location: [place] [facility];  Service: Cardiovascular;  Laterality: N/A;   [place]  10/25/2019   Procedure: Median Sternotomy.;  Surgeon: [name], MD;  Location[facility];  Service: Open Heart Surgery;;    PLACEMENT OF IMPELLA LEFT VENTRICULAR ASSIST DEVICE  10/30/2019   Procedure: Placement Of Impella Left Ventricular Assist Device 5.5 USING 10MM HEMASHIELD PLATINUM GRAFT;  Surgeon: [name], MD;  Location: [place];  Service: Open Heart Surgery;;  graft placed into aorta   REMOVAL OF IMPELLA LEFT VENTRICULAR ASSIST DEVICE N/A 11/03/2019   Procedure: REMOVAL OF IMPELLA 5.5 LEFT VENTRICULAR ASSIST DEVICE;  Surgeon: [name], MD;  Location: [place];  Service: Open Heart Surgery;  Laterality: N/A;   STERNAL CLOSURE N/A 10/30/2019   Procedure: Sternal Closure;  Surgeon: [name], MD;  Location: [place];  Service: Open Heart Surgery;  Laterality: N/A;   STERNAL WIRES REMOVAL N/A 05/14/2020   Procedure: STERNAL WIRES REMOVAL;  Surgeon: [name], MD;  Location: [place];  Service: Thoracic;  Laterality: N/A;   TEE WITHOUT CARDIOVERSION N/A 10/25/2019   Procedure: TRANSESOPHAGEAL ECHOCARDIOGRAM (TEE);  Surgeon: Prescott Gum, Collier Salina, MD;  Location: [place];  Service: Open Heart Surgery;  Laterality: N/A;   TEE WITHOUT CARDIOVERSION N/A 10/30/2019   Procedure: TRANSESOPHAGEAL ECHOCARDIOGRAM (TEE);  Surgeon: Prescott Gum, Collier Salina, MD;  Location: [place];  Service: Open Heart Surgery;  Laterality: N/A;   TEE WITHOUT CARDIOVERSION N/A 11/03/2019   Procedure: TRANSESOPHAGEAL ECHOCARDIOGRAM (TEE);  Surgeon: Prescott Gum, Collier Salina, MD;  Location: [place];  Service: Open Heart Surgery;  Laterality: N/A;   Past Surgical History:  Procedure Laterality Date   APPLICATION OF WOUND VAC  10/25/2019   Procedure: Application Of Wound Vac;  Surgeon: [name], MD;  Location: [place];  Service: Open Heart Surgery;;   CANNULATION FOR ECMO (EXTRACORPOREAL MEMBRANE OXYGENATION)  10/25/2019   Procedure: Cannulation For Ecmo (Extracorporeal Membrane Oxygenation) @ 6283;  Surgeon: [name], MD;  Location: [place];  Service: Open Heart Surgery;;   CANNULATION FOR ECMO (EXTRACORPOREAL MEMBRANE OXYGENATION) N/A 10/30/2019   Procedure:  DECANNULATION OF ECMO (EXTRACORPOREAL MEMBRANE OXYGENATION);  Surgeon: Prescott Gum, Collier Salina, MD;  Location: [place];  Service: Open Heart Surgery;  Laterality: N/A;  Pump standby   CHEST EXPLORATION  10/25/2019   Procedure: Chest Exploration for repair of coronary perforation (repair of LAD);  Surgeon: Prescott Gum, Collier Salina, MD;  Location: [place];  Service: Open Heart Surgery;;   CORONARY CTO INTERVENTION N/A 10/25/2019   Procedure: CORONARY CTO INTERVENTION;  Surgeon: Martinique, Peter M, MD;  Location: [place] [facility];  Service: Cardiovascular;  Laterality: N/A;   CORONARY STENT INTERVENTION N/A 10/17/2019   Procedure: CORONARY STENT INTERVENTION;  Surgeon: Sherren Mocha, MD;  Location: [place] [facility];  Service: Cardiovascular;  Laterality: N/A;   LEFT HEART CATH AND CORONARY ANGIOGRAPHY N/A 10/17/2019   Procedure: LEFT HEART CATH AND CORONARY ANGIOGRAPHY;  Surgeon: Sherren Mocha, MD;  Location: [place] [facility];  Service: Cardiovascular;  Laterality: N/A;   MEDIASTERNOTOMY  10/25/2019  Procedure: Median Sternotomy.;  Surgeon: [name], MD;  Location: [facility] OR;  Service: Open Heart Surgery;;   PLACEMENT OF IMPELLA LEFT VENTRICULAR ASSIST DEVICE  10/30/2019   Procedure: Placement Of Impella Left Ventricular Assist Device 5.5 USING 10MM HEMASHIELD PLATINUM GRAFT;  Surgeon: [name], MD;  Location: [place];  Service: Open Heart Surgery;;  graft placed into aorta   REMOVAL OF IMPELLA LEFT VENTRICULAR ASSIST DEVICE N/A 11/03/2019   Procedure: REMOVAL OF IMPELLA 5.5 LEFT VENTRICULAR ASSIST DEVICE;  Surgeon: [name], MD;  Location: [place];  Service: Open Heart Surgery;  Laterality: N/A;   STERNAL CLOSURE N/A 10/30/2019   Procedure: Sternal Closure;  Surgeon: [name], MD;  Location: [place];  Service: Open Heart Surgery;  Laterality: N/A;   STERNAL WIRES REMOVAL N/A 05/14/2020   Procedure: STERNAL WIRES REMOVAL;  Surgeon: [name], MD;  Location: [place];  Service: Thoracic;  Laterality:  N/A;   TEE WITHOUT CARDIOVERSION N/A 10/25/2019   Procedure: TRANSESOPHAGEAL ECHOCARDIOGRAM (TEE);  Surgeon: Prescott Gum, Collier Salina, MD;  Location: [place];  Service: Open Heart Surgery;  Laterality: N/A;   TEE WITHOUT CARDIOVERSION N/A 10/30/2019   Procedure: TRANSESOPHAGEAL ECHOCARDIOGRAM (TEE);  Surgeon: Prescott Gum, Collier Salina, MD;  Location: [place];  Service: Open Heart Surgery;  Laterality: N/A;   TEE WITHOUT CARDIOVERSION N/A 11/03/2019   Procedure: TRANSESOPHAGEAL ECHOCARDIOGRAM (TEE);  Surgeon: Prescott Gum, Collier Salina, MD;  Location: [place];  Service: Open Heart Surgery;  Laterality: N/A;   Past Medical History:  Diagnosis Date   Alcohol abuse    Anxiety    CAD (coronary artery disease)    Cardiac arrest ([place])    Cardiogenic shock (HCC)    CHF (congestive heart failure) (HCC)    Cough    Dysrhythmia    GERD (gastroesophageal reflux disease)    HFrEF (heart failure with reduced ejection fraction) (HCC)    Hypertension    Hypertriglyceridemia    Ischemic cardiomyopathy    Right atrial thrombus    Tobacco abuse    Ht 5' 8"  (1.727 m)    Wt 186 lb 9.6 oz (84.6 kg)    BMI 28.37 kg/m   Opioid Risk Score:   Fall Risk Score:  `1  Depression screen PHQ 2/9  Depression screen [facility] 2/9 05/02/2021 03/28/2021 03/10/2021 11/22/2020 10/25/2020 09/25/2020 09/03/2020  Decreased Interest 0 0 0 0 0 0 0  Down, Depressed, Hopeless 0 0 0 0 0 0 0  PHQ - 2 Score 0 0 0 0 0 0 0  Altered sleeping - - - - - - -  Tired, decreased energy - - - - - - -  Change in appetite - - - - - - -  Feeling bad or failure about yourself  - - - - - - -  Trouble concentrating - - - - - - -  Moving slowly or fidgety/restless - - - - - - -  Suicidal thoughts - - - - - - -  PHQ-9 Score - - - - - - -  Difficult doing work/chores - - - - - - -     Review of Systems  Constitutional: Negative.   HENT: Negative.    Eyes: Negative.   Respiratory: Negative.    Cardiovascular: Negative.   Gastrointestinal: Negative.   Endocrine: Negative.    Genitourinary: Negative.   Musculoskeletal:  Positive for back pain.  Skin: Negative.   Allergic/Immunologic: Negative.   Neurological: Negative.   Hematological: Negative.  Psychiatric/Behavioral: Negative.        Objective:   Physical Exam Vitals and nursing note reviewed.  Constitutional:      Appearance: Normal appearance.  Cardiovascular:     Rate and Rhythm: Normal rate and regular rhythm.     Pulses: Normal pulses.     Heart sounds: Normal heart sounds.  Pulmonary:     Effort: Pulmonary effort is normal.     Breath sounds: Normal breath sounds.  Musculoskeletal:     Cervical back: Normal range of motion and neck supple.     Comments: Normal Muscle Bulk and Muscle Testing Reveals:  Upper Extremities: Full ROM and Muscle Strength 5/5 Lumbar Paraspinal Tenderness: L-4-L-5 Lower Extremities: Full ROM and Muscle Strength 5/5 Arises from Table with ease Narrow Based  Gait     Skin:    General: Skin is warm and dry.  Neurological:     Mental Status: He is alert and oriented to person, place, and time.  Psychiatric:        Mood and Affect: Mood normal.        Behavior: Behavior normal.         Assessment & Plan:  1. Left Shoulder Pain: No complaints today. Continue HEP as Tolerated. Continue current medication regimen. Continue to monitor. 05/27/2021 2.Left Greater Trochanter Bursitis: No complaints today. S/P Left Hip Injection with Dr Ranell Patrick on 09/03/2020, no relief noted he reports, Continue to alternate with Heat an Ice Therapy. Continue to monitor. 05/27/2021 3. Bilateral  Knees OA: Continue HEP as Tolerated. Continue current medication regimen. 05/27/2021 3. Chest Wall Discomfort/ Neuropathic Pain: No complaints today. Continue Gabapentin. Continue to Monitor. 05/27/2021 4.Chronic Low Back Pain without sciatica: Continue HEP and Continue current medication regimen. Continue to monitor. 05/27/2021 5. Chronic Pain Syndrome: : Refilled: Oxycodone 10/325 mg one  tablet every 4 hours  as needed for pain #150. We will continue the opioid monitoring program, this consists of regular clinic visits, examinations, urine drug screen, pill counts as well as use of [place] Controlled Substance Reporting system. A 12 month History has been reviewed on the [place] Controlled Substance Reporting System on 05/27/2021.   F/U in 1 month

## 2021-05-29 ENCOUNTER — Ambulatory Visit (HOSPITAL_COMMUNITY): Payer: BC Managed Care – PPO | Attending: Orthopaedic Surgery

## 2021-05-29 ENCOUNTER — Other Ambulatory Visit: Payer: Self-pay

## 2021-05-29 DIAGNOSIS — M25561 Pain in right knee: Secondary | ICD-10-CM | POA: Insufficient documentation

## 2021-05-29 DIAGNOSIS — M25562 Pain in left knee: Secondary | ICD-10-CM | POA: Insufficient documentation

## 2021-05-29 NOTE — Therapy (Signed)
[place], [place], [address] Phone: [phone]   Fax:  [phone]  Physical Therapy Treatment  Patient Details  Name: William Crawford MRN: [id number] Date of Birth: Aug 11, 1970 Referring Provider (PT): Leandrew Koyanagi, MD   Encounter Date: 05/29/2021   PT End of Session - 05/29/21 1657     Visit Number 2    Number of Visits 3    Date for PT Re-Evaluation 06/20/21    Authorization Type BCBS other,    PT Start Time 1656   late arrival   PT Stop Time 1730    PT Time Calculation (min) 34 min    Activity Tolerance Patient tolerated treatment well    Behavior During Therapy [facility] for tasks assessed/performed             Past Medical History:  Diagnosis Date   Alcohol abuse    Anxiety    CAD (coronary artery disease)    Cardiac arrest ([place])    Cardiogenic shock ([place])    CHF (congestive heart failure) (HCC)    Cough    Dysrhythmia    GERD (gastroesophageal reflux disease)    HFrEF (heart failure with reduced ejection fraction) ([place])    Hypertension    Hypertriglyceridemia    Ischemic cardiomyopathy    Right atrial thrombus    Tobacco abuse     Past Surgical History:  Procedure Laterality Date   APPLICATION OF WOUND VAC  10/25/2019   Procedure: Application Of Wound Vac;  Surgeon: [name], MD;  Location: [place];  Service: Open Heart Surgery;;   CANNULATION FOR ECMO (EXTRACORPOREAL MEMBRANE OXYGENATION)  10/25/2019   Procedure: Cannulation For Ecmo (Extracorporeal Membrane Oxygenation) @ 1531;  Surgeon: [name], MD;  Location: [place];  Service: Open Heart Surgery;;   CANNULATION FOR ECMO (EXTRACORPOREAL MEMBRANE OXYGENATION) N/A 10/30/2019   Procedure: DECANNULATION OF ECMO (EXTRACORPOREAL MEMBRANE OXYGENATION);  Surgeon: Prescott Gum, Collier Salina, MD;  Location: [place];  Service: Open Heart Surgery;  Laterality: N/A;  Pump standby   CHEST EXPLORATION  10/25/2019   Procedure: Chest Exploration for repair  of coronary perforation (repair of LAD);  Surgeon: Prescott Gum, Collier Salina, MD;  Location: [place];  Service: Open Heart Surgery;;   CORONARY CTO INTERVENTION N/A 10/25/2019   Procedure: CORONARY CTO INTERVENTION;  Surgeon: Martinique, Peter M, MD;  Location: [place] [facility];  Service: Cardiovascular;  Laterality: N/A;   CORONARY STENT INTERVENTION N/A 10/17/2019   Procedure: CORONARY STENT INTERVENTION;  Surgeon: Sherren Mocha, MD;  Location: [place] [facility];  Service: Cardiovascular;  Laterality: N/A;   LEFT HEART CATH AND CORONARY ANGIOGRAPHY N/A 10/17/2019   Procedure: LEFT HEART CATH AND CORONARY ANGIOGRAPHY;  Surgeon: Sherren Mocha, MD;  Location: [place] [facility];  Service: Cardiovascular;  Laterality: N/A;   [place]  10/25/2019   Procedure: Median Sternotomy.;  Surgeon: [name], MD;  Location[facility] OR;  Service: Open Heart Surgery;;   PLACEMENT OF IMPELLA LEFT VENTRICULAR ASSIST DEVICE  10/30/2019   Procedure: Placement Of Impella Left Ventricular Assist Device 5.5 USING 10MM HEMASHIELD PLATINUM GRAFT;  Surgeon: [name], MD;  Location: [place];  Service: Open Heart Surgery;;  graft placed into aorta   REMOVAL OF IMPELLA LEFT VENTRICULAR ASSIST DEVICE N/A 11/03/2019   Procedure: REMOVAL OF IMPELLA 5.5 LEFT VENTRICULAR ASSIST DEVICE;  Surgeon: [name], MD;  Location: [place];  Service: Open Heart Surgery;  Laterality: N/A;  STERNAL CLOSURE N/A 10/30/2019   Procedure: Sternal Closure;  Surgeon: [name], MD;  Location: [place];  Service: Open Heart Surgery;  Laterality: N/A;   STERNAL WIRES REMOVAL N/A 05/14/2020   Procedure: STERNAL WIRES REMOVAL;  Surgeon: [name], MD;  Location: [place];  Service: Thoracic;  Laterality: N/A;   TEE WITHOUT CARDIOVERSION N/A 10/25/2019   Procedure: TRANSESOPHAGEAL ECHOCARDIOGRAM (TEE);  Surgeon: Prescott Gum, Collier Salina, MD;  Location: [place];  Service: Open Heart Surgery;  Laterality: N/A;   TEE WITHOUT CARDIOVERSION N/A 10/30/2019    Procedure: TRANSESOPHAGEAL ECHOCARDIOGRAM (TEE);  Surgeon: Prescott Gum, Collier Salina, MD;  Location: [place];  Service: Open Heart Surgery;  Laterality: N/A;   TEE WITHOUT CARDIOVERSION N/A 11/03/2019   Procedure: TRANSESOPHAGEAL ECHOCARDIOGRAM (TEE);  Surgeon: Prescott Gum, Collier Salina, MD;  Location: [place];  Service: Open Heart Surgery;  Laterality: N/A;    There were no vitals filed for this visit.   Subjective Assessment - 05/29/21 1659     Subjective Pt tolerating HEP well without adverse effects and has been performing 5x10 reps squats and 5 min stretching for quads    Currently in Pain? Yes    Pain Score 4     Pain Location Knee    Pain Orientation Right;Left;Anterior    Pain Descriptors / Indicators Aching;Sore    Pain Type Chronic pain    Pain Onset More than a month ago                               [facility] Adult PT Treatment/Exercise - 05/29/21 0001       Exercises   Exercises Knee/Hip      Knee/Hip Exercises: Stretches   Quad Stretch Both;2 reps;60 seconds      Knee/Hip Exercises: Aerobic   Recumbent Bike x 5 min for dynamic warm-up      Knee/Hip Exercises: Standing   Functional Squat 2 sets;10 reps    Other Standing Knee Exercises single leg deadlift 2x10    Other Standing Knee Exercises Reverse Nordic for quad eccentric 2x10                     PT Education - 05/29/21 1743     Education Details education on patellar tendon strap    Person(s) Educated Patient    Methods Explanation    Comprehension Verbalized understanding              PT Short Term Goals - 05/29/21 1744       PT SHORT TERM GOAL #1   Title Patient will be independent with HEP in order to improve functional outcomes.    Time 2    Period Weeks    Status On-going    Target Date 05/30/21               PT Long Term Goals - 05/09/21 1722       PT LONG TERM GOAL #1   Title Pt will be able to teach back the work place modification/adaptation to reduce knee pain     Time 4    Period Weeks    Status New    Target Date 06/20/21                   Plan - 05/29/21 1742     Clinical Impression Statement Good HEP compliance and notes some tenderness to touch along inferior pole of patella and  may be experiencing some patellar tendinitis. Tolerating HEP activities well without adverse effects. Will continue x 1 visit to address more work place modification ideas/problem solving    Personal Factors and Comorbidities Time since onset of injury/illness/exacerbation    Examination-Activity Limitations Squat;Stairs;Carry    Examination-Participation Restrictions Occupation    Stability/Clinical Decision Making Stable/Uncomplicated    Rehab Potential Good    PT Frequency 1x / week    PT Duration 3 weeks    PT Treatment/Interventions ADLs/Self Care Home Management;Aquatic Therapy;Electrical Stimulation;Gait training;Stair training;Functional mobility training;Therapeutic activities;Therapeutic exercise;Balance training;Patient/family education;Neuromuscular re-education;Manual techniques;Splinting;Taping    PT Next Visit Plan reinforce workplace modification/adaptation, HEP routine for knee OA    PT Home Exercise Plan chair squats, prone quad stretch, single leg deadlift, reverse nordic    Consulted and Agree with Plan of Care Patient             Patient will benefit from skilled therapeutic intervention in order to improve the following deficits and impairments:  Pain, Improper body mechanics, Decreased activity tolerance  Visit Diagnosis: Pain in both knees, unspecified chronicity     Problem List Patient Active Problem List   Diagnosis Date Noted   Sternal fracture 05/29/2020   Protruding sternal wires 05/08/2020   Near syncope    Syncope 03/18/2020   Neuropathic pain 02/07/2020   Chest wall discomfort 01/17/2020   Encounter for post surgical wound check 01/03/2020   Post-op pain 12/20/2019   Encounter for postoperative wound check  12/13/2019   Personal history of ECMO 11/22/2019   Postop check 11/17/2019   Encounter for therapeutic drug monitoring 11/14/2019   Atrial thrombus 11/14/2019   Endotracheally intubated    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[facility])    Status post cardiac surgery    Acute respiratory failure with hypoxia (HCC)    CAD (coronary artery disease) 10/25/2019   Angina pectoris ([place]) 10/25/2019   Myocardial infarction acute ([place]) 10/25/2019   Cardiogenic shock ([place]) 10/25/2019   Tobacco use 10/18/2019   Ischemic cardiomyopathy 10/18/2019   Hypertension 10/18/2019   Hyperlipemia 10/18/2019   Alcohol abuse 10/18/2019   NSTEMI (non-ST elevated myocardial infarction) ([place]) 10/17/2019    Toniann Fail, PT 05/29/2021, 5:45 PM  [place] [address] [place], [place], [address] Phone: [phone]   Fax:  [phone]  Name: William Crawford MRN: [id number] Date of Birth: Jul 28, 1970

## 2021-06-02 ENCOUNTER — Telehealth (HOSPITAL_COMMUNITY): Payer: Self-pay

## 2021-06-02 NOTE — Telephone Encounter (Signed)
Patient requested to be D/c - he is doing HEP and doesn't need to return. He is feeling great.

## 2021-06-03 ENCOUNTER — Encounter (HOSPITAL_COMMUNITY): Payer: Self-pay

## 2021-06-03 NOTE — Therapy (Signed)
[place], [place], [address] Phone: [phone]   Fax:  [phone]  Patient Details  Name: William Crawford MRN: [id number] Date of Birth: 08/18/1970 Referring Provider:  No ref. provider found  Encounter Date: 06/03/2021 PHYSICAL THERAPY DISCHARGE SUMMARY  Visits from Start of Care: 2  Current functional level related to goals / functional outcomes: Pt calls and reports issue resolved and feels 100% improved   Remaining deficits: none   Education / Equipment: HEP   Patient agrees to discharge. Patient goals were met. Patient is being discharged due to meeting the stated rehab goals.   Toniann Fail, PT 06/03/2021, 10:12 AM  [place], [place], [address] Phone: [phone]   Fax:  [phone]

## 2021-06-12 ENCOUNTER — Encounter (HOSPITAL_COMMUNITY): Payer: BC Managed Care – PPO | Admitting: Physical Therapy

## 2021-06-18 ENCOUNTER — Other Ambulatory Visit: Payer: Self-pay | Admitting: Physical Medicine and Rehabilitation

## 2021-06-18 ENCOUNTER — Encounter (HOSPITAL_COMMUNITY): Payer: BC Managed Care – PPO | Admitting: Physical Therapy

## 2021-06-25 ENCOUNTER — Encounter (HOSPITAL_COMMUNITY): Payer: BC Managed Care – PPO | Admitting: Physical Therapy

## 2021-06-26 ENCOUNTER — Other Ambulatory Visit: Payer: Self-pay

## 2021-06-26 ENCOUNTER — Other Ambulatory Visit: Payer: Self-pay | Admitting: Physical Medicine and Rehabilitation

## 2021-06-26 ENCOUNTER — Encounter: Payer: Self-pay | Admitting: Registered Nurse

## 2021-06-26 ENCOUNTER — Encounter: Payer: BC Managed Care – PPO | Attending: Physical Medicine and Rehabilitation | Admitting: Registered Nurse

## 2021-06-26 VITALS — BP 130/88 | HR 74 | Temp 98.2°F | Ht 68.0 in | Wt 184.0 lb

## 2021-06-26 DIAGNOSIS — G8929 Other chronic pain: Secondary | ICD-10-CM | POA: Diagnosis present

## 2021-06-26 DIAGNOSIS — Z79899 Other long term (current) drug therapy: Secondary | ICD-10-CM | POA: Diagnosis not present

## 2021-06-26 DIAGNOSIS — M545 Low back pain, unspecified: Secondary | ICD-10-CM | POA: Diagnosis not present

## 2021-06-26 DIAGNOSIS — M170 Bilateral primary osteoarthritis of knee: Secondary | ICD-10-CM | POA: Diagnosis present

## 2021-06-26 DIAGNOSIS — M2550 Pain in unspecified joint: Secondary | ICD-10-CM | POA: Diagnosis present

## 2021-06-26 DIAGNOSIS — Z5181 Encounter for therapeutic drug level monitoring: Secondary | ICD-10-CM | POA: Insufficient documentation

## 2021-06-26 DIAGNOSIS — G894 Chronic pain syndrome: Secondary | ICD-10-CM | POA: Diagnosis not present

## 2021-06-26 MED ORDER — OXYCODONE-ACETAMINOPHEN 10-325 MG PO TABS: 1.0000 | ORAL_TABLET | ORAL | 0 refills | Status: DC | PRN

## 2021-06-26 MED ORDER — TRAZODONE HCL 100 MG PO TABS: 100.0000 mg | ORAL_TABLET | Freq: Every day | ORAL | 3 refills | Status: DC

## 2021-06-26 NOTE — Progress Notes (Signed)
Subjective:    Patient ID: William Crawford, male    DOB: [birth year], 51 y.o.   MRN: [id number]  HPI: William Crawford is a 51 y.o. male who returns for follow up appointment for chronic pain and medication refill. He states his pain is located in his lower back, bilateral knees and generalized joint pain. He rates his pain 7. His current exercise regime is walking and performing stretching exercises.  William Crawford Morphine equivalent is 90.00  MME.   UDS ordered today.      Pain Inventory Average Pain 9 Pain Right Now 7 My pain is intermittent, constant, dull, stabbing, and tingling  In the last 24 hours, has pain interfered with the following? General activity 10 Relation with others 10 Enjoyment of life 10 What TIME of day is your pain at its worst? daytime and night Sleep (in general) Fair  Pain is worse with: walking, bending, sitting, inactivity, standing, and some activites Pain improves with: rest, heat/ice, and medication Relief from Meds: 7  Family History  Problem Relation Age of Onset   Heart attack Father    Heart disease Father    Heart attack Paternal Grandfather    Heart disease Paternal Grandfather    Heart attack Paternal Uncle    Heart disease Paternal Uncle    Heart attack Paternal Uncle    Heart disease Paternal Uncle    Social History   Socioeconomic History   Marital status: Single    Spouse name: Not on file   Number of children: Not on file   Years of education: Not on file   Highest education level: Not on file  Occupational History   Not on file  Tobacco Use   Smoking status: Former    Types: Cigarettes    Quit date: 10/23/2019    Years since quitting: 1.6   Smokeless tobacco: Never  Vaping Use   Vaping Use: Never used  Substance and Sexual Activity   Alcohol use: Not Currently    Comment: QUIT  IN MAY 44010   Drug use: No   Sexual activity: Not on file  Other Topics Concern   Not on file  Social History Narrative   Not on  file   Social Determinants of Health   Financial Resource Strain: Not on file  Food Insecurity: Not on file  Transportation Needs: Not on file  Physical Activity: Not on file  Stress: Not on file  Social Connections: Not on file   Past Surgical History:  Procedure Laterality Date   APPLICATION OF WOUND VAC  10/25/2019   Procedure: Application Of Wound Vac;  Surgeon: [name], MD;  Location: [place];  Service: Open Heart Surgery;;   CANNULATION FOR ECMO (EXTRACORPOREAL MEMBRANE OXYGENATION)  10/25/2019   Procedure: Cannulation For Ecmo (Extracorporeal Membrane Oxygenation) @ 1531;  Surgeon: Prescott Gum, Collier Salina, MD;  Location: [place];  Service: Open Heart Surgery;;   CANNULATION FOR ECMO (EXTRACORPOREAL MEMBRANE OXYGENATION) N/A 10/30/2019   Procedure: DECANNULATION OF ECMO (EXTRACORPOREAL MEMBRANE OXYGENATION);  Surgeon: Prescott Gum, Collier Salina, MD;  Location: [place];  Service: Open Heart Surgery;  Laterality: N/A;  Pump standby   CHEST EXPLORATION  10/25/2019   Procedure: Chest Exploration for repair of coronary perforation (repair of LAD);  Surgeon: Prescott Gum, Collier Salina, MD;  Location: [place];  Service: Open Heart Surgery;;   CORONARY CTO INTERVENTION N/A 10/25/2019   Procedure: CORONARY CTO INTERVENTION;  Surgeon: Martinique, Peter M, MD;  Location: [place] [facility];  Service: Cardiovascular;  Laterality: N/A;   CORONARY STENT INTERVENTION N/A 10/17/2019   Procedure: CORONARY STENT INTERVENTION;  Surgeon: Sherren Mocha, MD;  Location: [place] [facility];  Service: Cardiovascular;  Laterality: N/A;   LEFT HEART CATH AND CORONARY ANGIOGRAPHY N/A 10/17/2019   Procedure: LEFT HEART CATH AND CORONARY ANGIOGRAPHY;  Surgeon: Sherren Mocha, MD;  Location: [place] [facility];  Service: Cardiovascular;  Laterality: N/A;   [place]  10/25/2019   Procedure: Median Sternotomy.;  Surgeon: [name], MD;  Location: [facility];  Service: Open Heart Surgery;;   PLACEMENT OF IMPELLA LEFT VENTRICULAR ASSIST DEVICE   10/30/2019   Procedure: Placement Of Impella Left Ventricular Assist Device 5.5 USING 10MM HEMASHIELD PLATINUM GRAFT;  Surgeon: [name], MD;  Location: [place];  Service: Open Heart Surgery;;  graft placed into aorta   REMOVAL OF IMPELLA LEFT VENTRICULAR ASSIST DEVICE N/A 11/03/2019   Procedure: REMOVAL OF IMPELLA 5.5 LEFT VENTRICULAR ASSIST DEVICE;  Surgeon: [name], MD;  Location: [place];  Service: Open Heart Surgery;  Laterality: N/A;   STERNAL CLOSURE N/A 10/30/2019   Procedure: Sternal Closure;  Surgeon: [name], MD;  Location: [place];  Service: Open Heart Surgery;  Laterality: N/A;   STERNAL WIRES REMOVAL N/A 05/14/2020   Procedure: STERNAL WIRES REMOVAL;  Surgeon: [name], MD;  Location: [place];  Service: Thoracic;  Laterality: N/A;   TEE WITHOUT CARDIOVERSION N/A 10/25/2019   Procedure: TRANSESOPHAGEAL ECHOCARDIOGRAM (TEE);  Surgeon: Prescott Gum, Collier Salina, MD;  Location: [place];  Service: Open Heart Surgery;  Laterality: N/A;   TEE WITHOUT CARDIOVERSION N/A 10/30/2019   Procedure: TRANSESOPHAGEAL ECHOCARDIOGRAM (TEE);  Surgeon: Prescott Gum, Collier Salina, MD;  Location: [place];  Service: Open Heart Surgery;  Laterality: N/A;   TEE WITHOUT CARDIOVERSION N/A 11/03/2019   Procedure: TRANSESOPHAGEAL ECHOCARDIOGRAM (TEE);  Surgeon: Prescott Gum, Collier Salina, MD;  Location: [place];  Service: Open Heart Surgery;  Laterality: N/A;   Past Surgical History:  Procedure Laterality Date   APPLICATION OF WOUND VAC  10/25/2019   Procedure: Application Of Wound Vac;  Surgeon: [name], MD;  Location: [place];  Service: Open Heart Surgery;;   CANNULATION FOR ECMO (EXTRACORPOREAL MEMBRANE OXYGENATION)  10/25/2019   Procedure: Cannulation For Ecmo (Extracorporeal Membrane Oxygenation) @ 6599;  Surgeon: [name], MD;  Location: [place];  Service: Open Heart Surgery;;   CANNULATION FOR ECMO (EXTRACORPOREAL MEMBRANE OXYGENATION) N/A 10/30/2019   Procedure: DECANNULATION OF ECMO (EXTRACORPOREAL MEMBRANE  OXYGENATION);  Surgeon: Prescott Gum, Collier Salina, MD;  Location: [place];  Service: Open Heart Surgery;  Laterality: N/A;  Pump standby   CHEST EXPLORATION  10/25/2019   Procedure: Chest Exploration for repair of coronary perforation (repair of LAD);  Surgeon: Prescott Gum, Collier Salina, MD;  Location: [place];  Service: Open Heart Surgery;;   CORONARY CTO INTERVENTION N/A 10/25/2019   Procedure: CORONARY CTO INTERVENTION;  Surgeon: Martinique, Peter M, MD;  Location: [place] [facility];  Service: Cardiovascular;  Laterality: N/A;   CORONARY STENT INTERVENTION N/A 10/17/2019   Procedure: CORONARY STENT INTERVENTION;  Surgeon: Sherren Mocha, MD;  Location: [place] [facility];  Service: Cardiovascular;  Laterality: N/A;   LEFT HEART CATH AND CORONARY ANGIOGRAPHY N/A 10/17/2019   Procedure: LEFT HEART CATH AND CORONARY ANGIOGRAPHY;  Surgeon: Sherren Mocha, MD;  Location: [place] [facility];  Service: Cardiovascular;  Laterality: N/A;   [place]  10/25/2019   Procedure: Median Sternotomy.;  Surgeon: [name], MD;  [facility];  Service: Open Heart Surgery;;   PLACEMENT OF IMPELLA LEFT VENTRICULAR ASSIST DEVICE  10/30/2019   Procedure: Placement Of Impella Left Ventricular Assist Device 5.5 USING 10MM HEMASHIELD PLATINUM GRAFT;  Surgeon: [name], MD;  Location: [place];  Service: Open Heart Surgery;;  graft placed into aorta   REMOVAL OF IMPELLA LEFT VENTRICULAR ASSIST DEVICE N/A 11/03/2019   Procedure: REMOVAL OF IMPELLA 5.5 LEFT VENTRICULAR ASSIST DEVICE;  Surgeon: [name], MD;  Location: [place];  Service: Open Heart Surgery;  Laterality: N/A;   STERNAL CLOSURE N/A 10/30/2019   Procedure: Sternal Closure;  Surgeon: [name], MD;  Location: [place];  Service: Open Heart Surgery;  Laterality: N/A;   STERNAL WIRES REMOVAL N/A 05/14/2020   Procedure: STERNAL WIRES REMOVAL;  Surgeon: [name], MD;  Location: [place];  Service: Thoracic;  Laterality: N/A;   TEE WITHOUT CARDIOVERSION N/A 10/25/2019    Procedure: TRANSESOPHAGEAL ECHOCARDIOGRAM (TEE);  Surgeon: Prescott Gum, Collier Salina, MD;  Location: [place];  Service: Open Heart Surgery;  Laterality: N/A;   TEE WITHOUT CARDIOVERSION N/A 10/30/2019   Procedure: TRANSESOPHAGEAL ECHOCARDIOGRAM (TEE);  Surgeon: Prescott Gum, Collier Salina, MD;  Location: [place];  Service: Open Heart Surgery;  Laterality: N/A;   TEE WITHOUT CARDIOVERSION N/A 11/03/2019   Procedure: TRANSESOPHAGEAL ECHOCARDIOGRAM (TEE);  Surgeon: Prescott Gum, Collier Salina, MD;  Location: [place];  Service: Open Heart Surgery;  Laterality: N/A;   Past Medical History:  Diagnosis Date   Alcohol abuse    Anxiety    CAD (coronary artery disease)    Cardiac arrest ([place])    Cardiogenic shock (HCC)    CHF (congestive heart failure) (HCC)    Cough    Dysrhythmia    GERD (gastroesophageal reflux disease)    HFrEF (heart failure with reduced ejection fraction) (HCC)    Hypertension    Hypertriglyceridemia    Ischemic cardiomyopathy    Right atrial thrombus    Tobacco abuse    BP 130/88    Pulse 74    Temp 98.2 F (36.8 C)    Ht 5' 8"  (1.727 m)    Wt 184 lb (83.5 kg)    SpO2 98%    BMI 27.98 kg/m   Opioid Risk Score:   Fall Risk Score:  `1  Depression screen PHQ 2/9  Depression screen [facility] 2/9 05/02/2021 03/28/2021 03/10/2021 11/22/2020 10/25/2020 09/25/2020 09/03/2020  Decreased Interest 0 0 0 0 0 0 0  Down, Depressed, Hopeless 0 0 0 0 0 0 0  PHQ - 2 Score 0 0 0 0 0 0 0  Altered sleeping - - - - - - -  Tired, decreased energy - - - - - - -  Change in appetite - - - - - - -  Feeling bad or failure about yourself  - - - - - - -  Trouble concentrating - - - - - - -  Moving slowly or fidgety/restless - - - - - - -  Suicidal thoughts - - - - - - -  PHQ-9 Score - - - - - - -  Difficult doing work/chores - - - - - - -    Review of Systems  Musculoskeletal:  Positive for back pain. Negative for gait problem.       Pain in  both knees  Neurological:  Positive for headaches.  All other systems reviewed and are  negative.     Objective:   Physical Exam Vitals and  nursing note reviewed.  Constitutional:      Appearance: Normal appearance.  Cardiovascular:     Rate and Rhythm: Normal rate and regular rhythm.     Pulses: Normal pulses.     Heart sounds: Normal heart sounds.  Pulmonary:     Effort: Pulmonary effort is normal.     Breath sounds: Normal breath sounds.  Musculoskeletal:     Cervical back: Normal range of motion and neck supple.     Comments: Normal Muscle Bulk and Muscle Testing Reveals:  Upper Extremities: Full ROM and Muscle Strength 5/5 Lumbar Paraspinal Tenderness: L-3-L-5 Lower Extremities: Full ROM and Muscle Strength 5/5 Arises from Table with ease Narrow Based  Gait     Skin:    General: Skin is warm and dry.  Neurological:     Mental Status: He is alert and oriented to person, place, and time.  Psychiatric:        Mood and Affect: Mood normal.        Behavior: Behavior normal.         Assessment & Plan:  1. Left Shoulder Pain: No complaints today. Continue HEP as Tolerated. Continue current medication regimen. Continue to monitor. 06/26/2021 2.Left Greater Trochanter Bursitis: No complaints today. S/P Left Hip Injection with Dr Ranell Patrick on 09/03/2020, no relief noted he reports, Continue to alternate with Heat an Ice Therapy. Continue to monitor. 06/26/2021 3. Bilateral  Knees OA: Continue HEP as Tolerated. Continue current medication regimen. 06/26/2021 3. Chest Wall Discomfort/ Neuropathic Pain: No complaints today. Continue Gabapentin. Continue to Monitor. 06/26/2021 4.Chronic Low Back Pain without sciatica: Continue HEP and Continue current medication regimen. Continue to monitor. 02/022023 5. Chronic Pain Syndrome: : Refilled: Oxycodone 10/325 mg one tablet every 4 hours  as needed for pain #150. We will continue the opioid monitoring program, this consists of regular clinic visits, examinations, urine drug screen, pill counts as well as use of [place]  Controlled Substance Reporting system. A 12 month History has been reviewed on the [place] Controlled Substance Reporting System on 06/26/2021.   F/U in 1 month

## 2021-07-03 ENCOUNTER — Telehealth: Payer: Self-pay | Admitting: Registered Nurse

## 2021-07-03 ENCOUNTER — Telehealth: Payer: Self-pay | Admitting: *Deleted

## 2021-07-03 LAB — TOXASSURE SELECT,+ANTIDEPR,UR

## 2021-07-03 MED ORDER — OXYCODONE-ACETAMINOPHEN 10-325 MG PO TABS: 1.0000 | ORAL_TABLET | ORAL | 0 refills | Status: DC | PRN

## 2021-07-03 NOTE — Telephone Encounter (Signed)
Urine drug screen for this encounter is consistent for prescribed medication 

## 2021-07-03 NOTE — Telephone Encounter (Signed)
PMP was Reviewed.  Oxycodone e-scribed today.  William Crawford is awre via My-Chart Message

## 2021-08-04 ENCOUNTER — Ambulatory Visit: Payer: BLUE CROSS/BLUE SHIELD | Admitting: Physical Medicine and Rehabilitation

## 2021-08-04 ENCOUNTER — Telehealth: Payer: Self-pay

## 2021-08-04 NOTE — Telephone Encounter (Signed)
PA for Oxycodone-APAP sent to insurance through CoverMyMeds ?

## 2021-08-05 NOTE — Telephone Encounter (Signed)
PA Case: [id number], Status: Approved, Coverage Starts on: 08/04/2021 12:00 AM, Coverage Ends on: 08/05/2022 12:00 AM. ?

## 2021-08-19 ENCOUNTER — Other Ambulatory Visit: Payer: Self-pay

## 2021-08-19 ENCOUNTER — Encounter: Payer: 59 | Attending: Physical Medicine and Rehabilitation | Admitting: Physical Medicine and Rehabilitation

## 2021-08-19 ENCOUNTER — Encounter: Payer: Self-pay | Admitting: Physical Medicine and Rehabilitation

## 2021-08-19 VITALS — BP 148/95 | HR 86 | Ht 68.0 in | Wt 174.6 lb

## 2021-08-19 DIAGNOSIS — E349 Endocrine disorder, unspecified: Secondary | ICD-10-CM | POA: Insufficient documentation

## 2021-08-19 DIAGNOSIS — E559 Vitamin D deficiency, unspecified: Secondary | ICD-10-CM | POA: Insufficient documentation

## 2021-08-19 DIAGNOSIS — Z125 Encounter for screening for malignant neoplasm of prostate: Secondary | ICD-10-CM | POA: Insufficient documentation

## 2021-08-19 DIAGNOSIS — M6530 Trigger finger, unspecified finger: Secondary | ICD-10-CM | POA: Insufficient documentation

## 2021-08-19 MED ORDER — LIDOCAINE 5 % EX PTCH: 1.0000 | MEDICATED_PATCH | CUTANEOUS | 1 refills | Status: DC

## 2021-08-19 MED ORDER — OXYCODONE-ACETAMINOPHEN 10-325 MG PO TABS: 1.0000 | ORAL_TABLET | ORAL | 0 refills | Status: DC | PRN

## 2021-08-19 NOTE — Progress Notes (Signed)
? ?Subjective:  ? ? Patient ID: William Crawford, male    DOB: 02-09-1971, 51 y.o.   MRN: [id number] ? ?HPI: ? William Crawford is a 51 y.o. male who presents for follow-up of worsening low back pain, bilateral knee pain, chest pain, and neck pain.  ? ?-currently pain is worse in both knees and his low back. He cannot say whether the testosterone supplement helps.  ?-been ok ?-qutenza has helped minimize his chest pain ?-had a death in the family- uncle had a pneumonia. He lives in [place]. He died before he could leave work. Her aunt is taking this pretty hard. He was really close to him. They used to talk 2-3 times per week.  ?-The knee pain is located just under the knee cap and extends medially to laterally ?-He is feeling much better this week and has been able to work, but still having significant knee and low back pain ?-He was unable to work today due to pain in both back and knees ?-Also has chest pain at times- sharp and lasts three minutes when he has it.  ?-Pain has grown excruciating but he has been able to return to work as he needs to financially. ?-Discussed results of his bilateral knee MRI, his neck XR, and his back XR ?-He continues to have chest pain and had recent follow-up with cardiology. Would still like to try the Qutenza patch ?-he has never tried Cymbalta and would be willing to ?-he requests if oxycodone can be increased to 6 tablets per day.  ?-does not note radiation of pain into legs, but legs have given out dur to pain causing near fall ?-he does not note loss of bowel or bladder function ?-does note constipation ?-also having shooting pain down both arms into pinky fingers ?-discussed that bilateral knee pain and back pain could potentially be related to opioid-induced testosterone deficiency and he is agreeable to checking testosterone levels ?-he is also interested in ortho follow-up for his knee pain ?-he is feeling better today and was able to go to work ?-had burning pain over  the weekend from the William Crawford that is now gone. Forgot to use the cooling gel. ?-he has never had PSA checked, discussed that result is 0.4 which is low, so we could trial testosterone patch given his deficiency and symptoms if he would like. He is agreeable and I have sent to his pharmacy.  ? ?Prior history: He states his pain is located in his left shoulder, lower back pain, left hip and bilateral knee pain. He was in contact with Dr Ranell Patrick regarding his increase intensity of pain, per Dr Ranell Patrick note his oxycodone will be increased to 5 times a day as needed for pain. William Crawford understanding. ? ?He rates his pain 4. His current exercise regime is walking and performing stretching exercises. ? ?William Crawford Morphine equivalent is 60.00  MME.   Last UDs was Performed on 08/28/2020, it was consistent.  ? ?He is having a good summer.  ? ?He fell down and hit his right ankle and got a big bruise- not hurting too bad.  ? ?He had a good time with his grandson riding rolling coasters at [employer] ? ?His chest pain returned and it feels sore and he plans to f/u with his surgeon regarding the pain. It feels sharp, hot, like a burning He takes Gabapentin 670m- but that doesn't help unless used with the oxycodone. ? ?Currently his pain is worse in bilateral knees-  it is worst when he kneels, climbs ladders. He has a supportive community at work which allow him to limit his kneeling and he has been using pads when he kneels. His oxycodone is now only lasting him about 2.5 hours after he takes it and he has been taking them 5 times per day. He asks if there is anything stronger he can take. He has tried Voltaren gel in the past and its effect usually started an hour after application.  ?-got compression sleeves to apply to his knees and has been able to work ?-at times his knee still buckles and this is painful ?-he feels he is getting used to the pain with walking but as far as climbing he feels it a lot  more ?-he got a steroid injection by orthopedics and did find that it helped but did not last very long.  ?-he has been trying to eat more foods that reduce inflammation ? ?Low testosterone:  ?-he bought an over the counter supplement since the patch was not covered by insurance ?-takes magnesium, B1, zinc, vitamin D.  ?  ? ? ?Pain Inventory ?Average Pain 9 ?Pain Right Now 7 ?My pain is sharp, burning, stabbing, tingling, and aching ? ?In the last 24 hours, has pain interfered with the following? ?General activity 7 ?Relation with others 3 ?Enjoyment of life 5 ?What TIME of day is your pain at its worst? morning , daytime, and evening ?Sleep (in general) Fair ? ?Pain is worse with: bending and sitting ?Pain improves with: medication ?Relief from Meds: 8 ? ?Family History  ?Problem Relation Age of Onset  ? Heart attack Father   ? Heart disease Father   ? Heart attack Paternal Grandfather   ? Heart disease Paternal Grandfather   ? Heart attack Paternal Uncle   ? Heart disease Paternal Uncle   ? Heart attack Paternal Uncle   ? Heart disease Paternal Uncle   ? ?Social History  ? ?Socioeconomic History  ? Marital status: Single  ?  Spouse name: Not on file  ? Number of children: Not on file  ? Years of education: Not on file  ? Highest education level: Not on file  ?Occupational History  ? Not on file  ?Tobacco Use  ? Smoking status: Former  ?  Types: Cigarettes  ?  Quit date: 10/23/2019  ?  Years since quitting: 1.8  ? Smokeless tobacco: Never  ?Vaping Use  ? Vaping Use: Never used  ?Substance and Sexual Activity  ? Alcohol use: Not Currently  ?  Comment: QUIT  IN MAY 96283  ? Drug use: No  ? Sexual activity: Not on file  ?Other Topics Concern  ? Not on file  ?Social History Narrative  ? Not on file  ? ?Social Determinants of Health  ? ?Financial Resource Strain: Not on file  ?Food Insecurity: Not on file  ?Transportation Needs: Not on file  ?Physical Activity: Not on file  ?Stress: Not on file  ?Social Connections: Not  on file  ? ?Past Surgical History:  ?Procedure Laterality Date  ? APPLICATION OF WOUND VAC  10/25/2019  ? Procedure: Application Of Wound Vac;  Surgeon: Prescott Gum, Collier Salina, MD;  Location: [place];  Service: Open Heart Surgery;;  ? CANNULATION FOR ECMO (EXTRACORPOREAL MEMBRANE OXYGENATION)  10/25/2019  ? Procedure: Cannulation For Ecmo (Extracorporeal Membrane Oxygenation) @ 6629;  Surgeon: Prescott Gum, Collier Salina, MD;  Location: [place];  Service: Open Heart Surgery;;  ? CANNULATION FOR ECMO (EXTRACORPOREAL MEMBRANE OXYGENATION)  N/A 10/30/2019  ? Procedure: DECANNULATION OF ECMO (EXTRACORPOREAL MEMBRANE OXYGENATION);  Surgeon: Prescott Gum, Collier Salina, MD;  Location: [place];  Service: Open Heart Surgery;  Laterality: N/A;  Pump standby  ? CHEST EXPLORATION  10/25/2019  ? Procedure: Chest Exploration for repair of coronary perforation (repair of LAD);  Surgeon: Prescott Gum, Collier Salina, MD;  Location: [place];  Service: Open Heart Surgery;;  ? CORONARY CTO INTERVENTION N/A 10/25/2019  ? Procedure: CORONARY CTO INTERVENTION;  Surgeon: Martinique, Peter M, MD;  Location: [place] [facility];  Service: Cardiovascular;  Laterality: N/A;  ? CORONARY STENT INTERVENTION N/A 10/17/2019  ? Procedure: CORONARY STENT INTERVENTION;  Surgeon: Sherren Mocha, MD;  Location: [place] [facility];  Service: Cardiovascular;  Laterality: N/A;  ? LEFT HEART CATH AND CORONARY ANGIOGRAPHY N/A 10/17/2019  ? Procedure: LEFT HEART CATH AND CORONARY ANGIOGRAPHY;  Surgeon: Sherren Mocha, MD;  Location: [place] [facility];  Service: Cardiovascular;  Laterality: N/A;  ? MEDIASTERNOTOMY  10/25/2019  ? Procedure: Median Sternotomy.;  Surgeon: [name], MD;  Location: [place];  Service: Open Heart Surgery;;  ? PLACEMENT OF [place] LEFT VENTRICULAR ASSIST DEVICE  10/30/2019  ? Procedure: Placement Of Impella Left Ventricular Assist Device 5.5 USING 10MM HEMASHIELD PLATINUM GRAFT;  Surgeon: [name], MD;  Location: [place];  Service: Open Heart Surgery;;  graft placed into aorta  ?  REMOVAL OF IMPELLA LEFT VENTRICULAR ASSIST DEVICE N/A 11/03/2019  ? Procedure: REMOVAL OF IMPELLA 5.5 LEFT VENTRICULAR ASSIST DEVICE;  Surgeon: [name], MD;  Location: [place];  Service: Open Heart Surgery;

## 2021-08-19 NOTE — Addendum Note (Signed)
Addended by: [name] on: 08/19/2021 11:23 AM ? ? Modules accepted: Orders ? ?

## 2021-08-20 ENCOUNTER — Encounter (HOSPITAL_BASED_OUTPATIENT_CLINIC_OR_DEPARTMENT_OTHER): Payer: 59 | Admitting: Physical Medicine and Rehabilitation

## 2021-08-20 ENCOUNTER — Telehealth: Payer: Self-pay

## 2021-08-20 ENCOUNTER — Encounter: Payer: Self-pay | Admitting: Physical Medicine and Rehabilitation

## 2021-08-20 DIAGNOSIS — M6530 Trigger finger, unspecified finger: Secondary | ICD-10-CM | POA: Diagnosis not present

## 2021-08-20 DIAGNOSIS — E559 Vitamin D deficiency, unspecified: Secondary | ICD-10-CM

## 2021-08-20 DIAGNOSIS — E349 Endocrine disorder, unspecified: Secondary | ICD-10-CM | POA: Diagnosis not present

## 2021-08-20 LAB — TESTOSTERONE: Testosterone: 280 ng/dL (ref 264–916)

## 2021-08-20 LAB — VITAMIN D 25 HYDROXY (VIT D DEFICIENCY, FRACTURES): Vit D, 25-Hydroxy: 41.9 ng/mL (ref 30.0–100.0)

## 2021-08-20 IMAGING — CR DG CHEST 2V
2 series · 2 of 2 positions shown · non-contrast
Comparison: None.

CLINICAL DATA: Chest pain

EXAM:
CHEST - 2 VIEW

[chest pa]
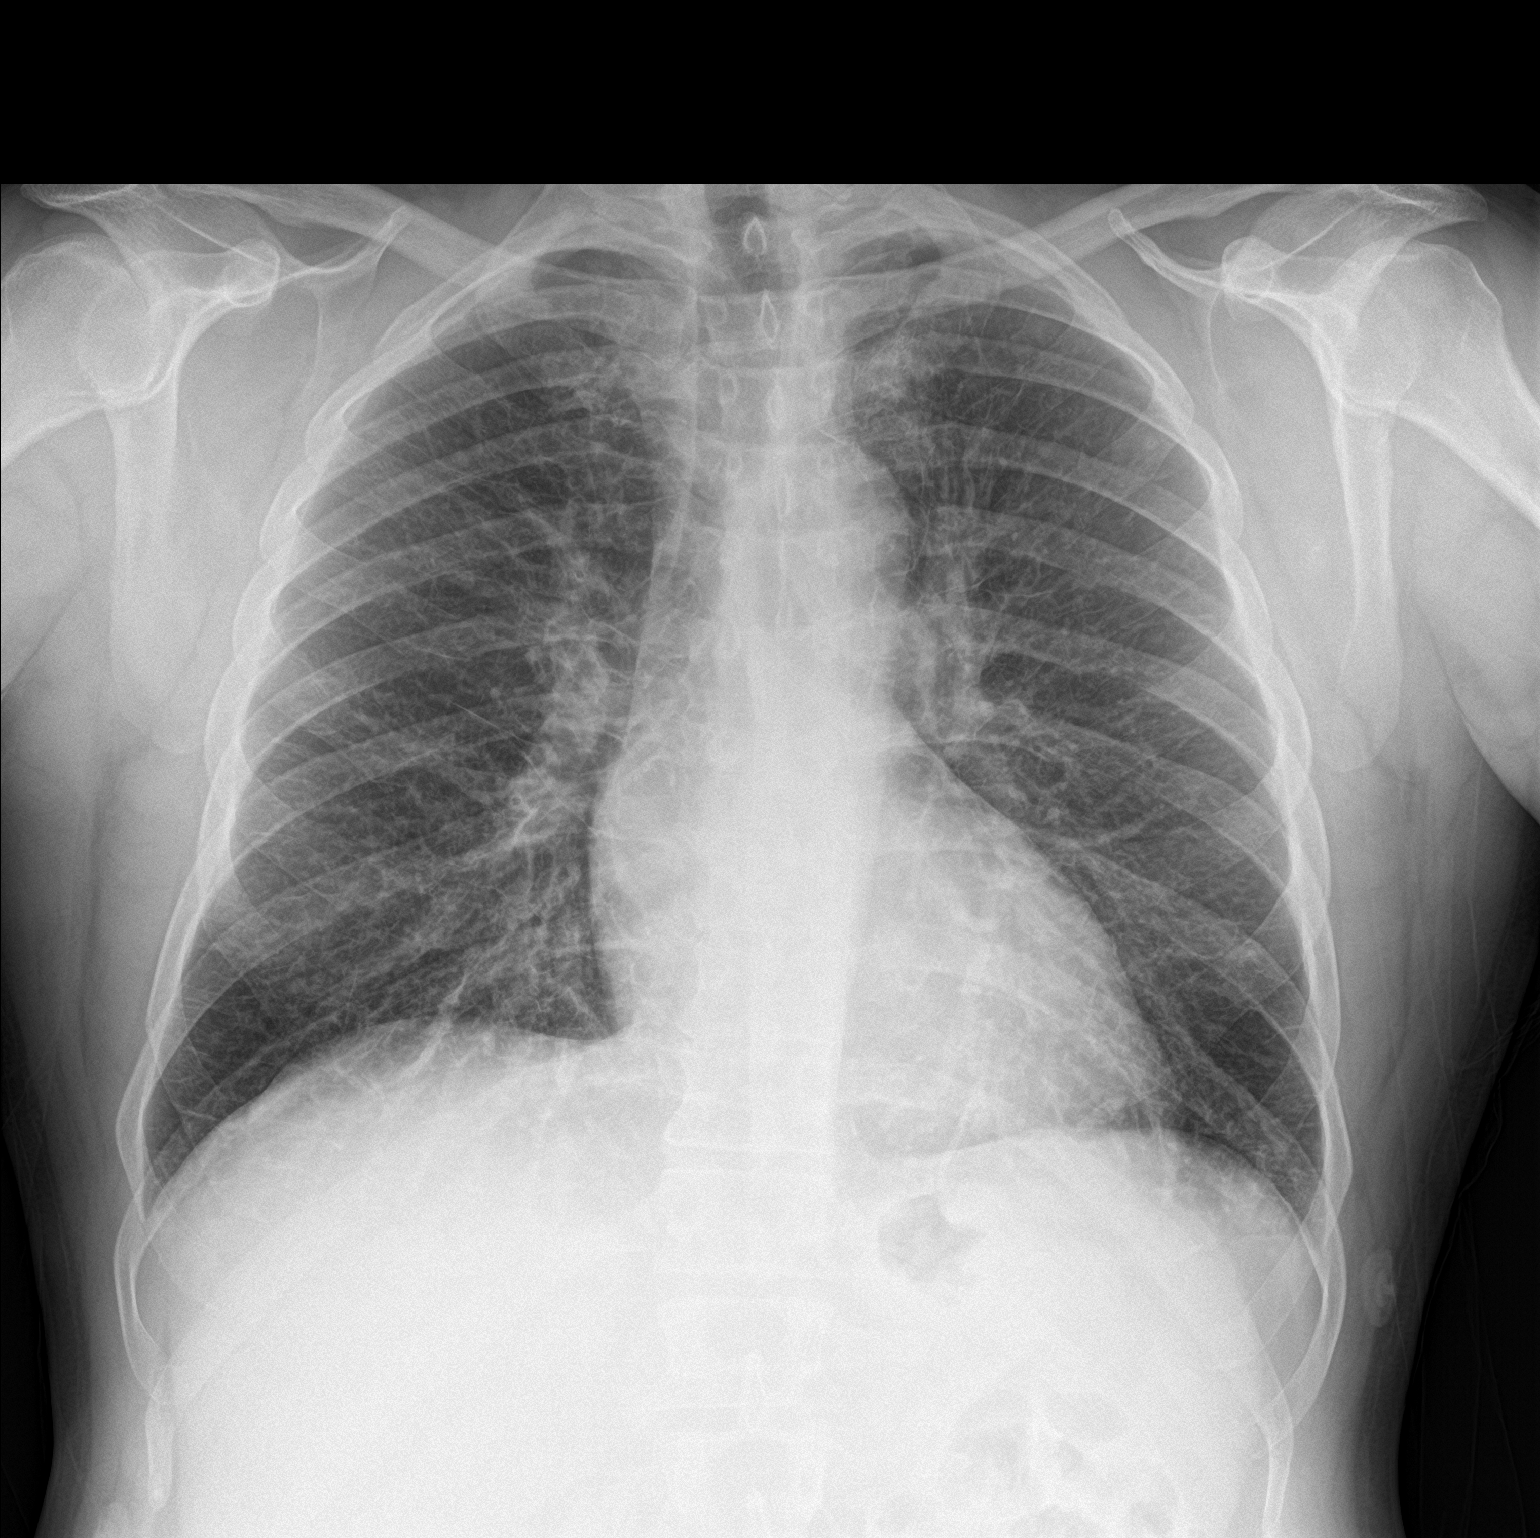

[chest lat]
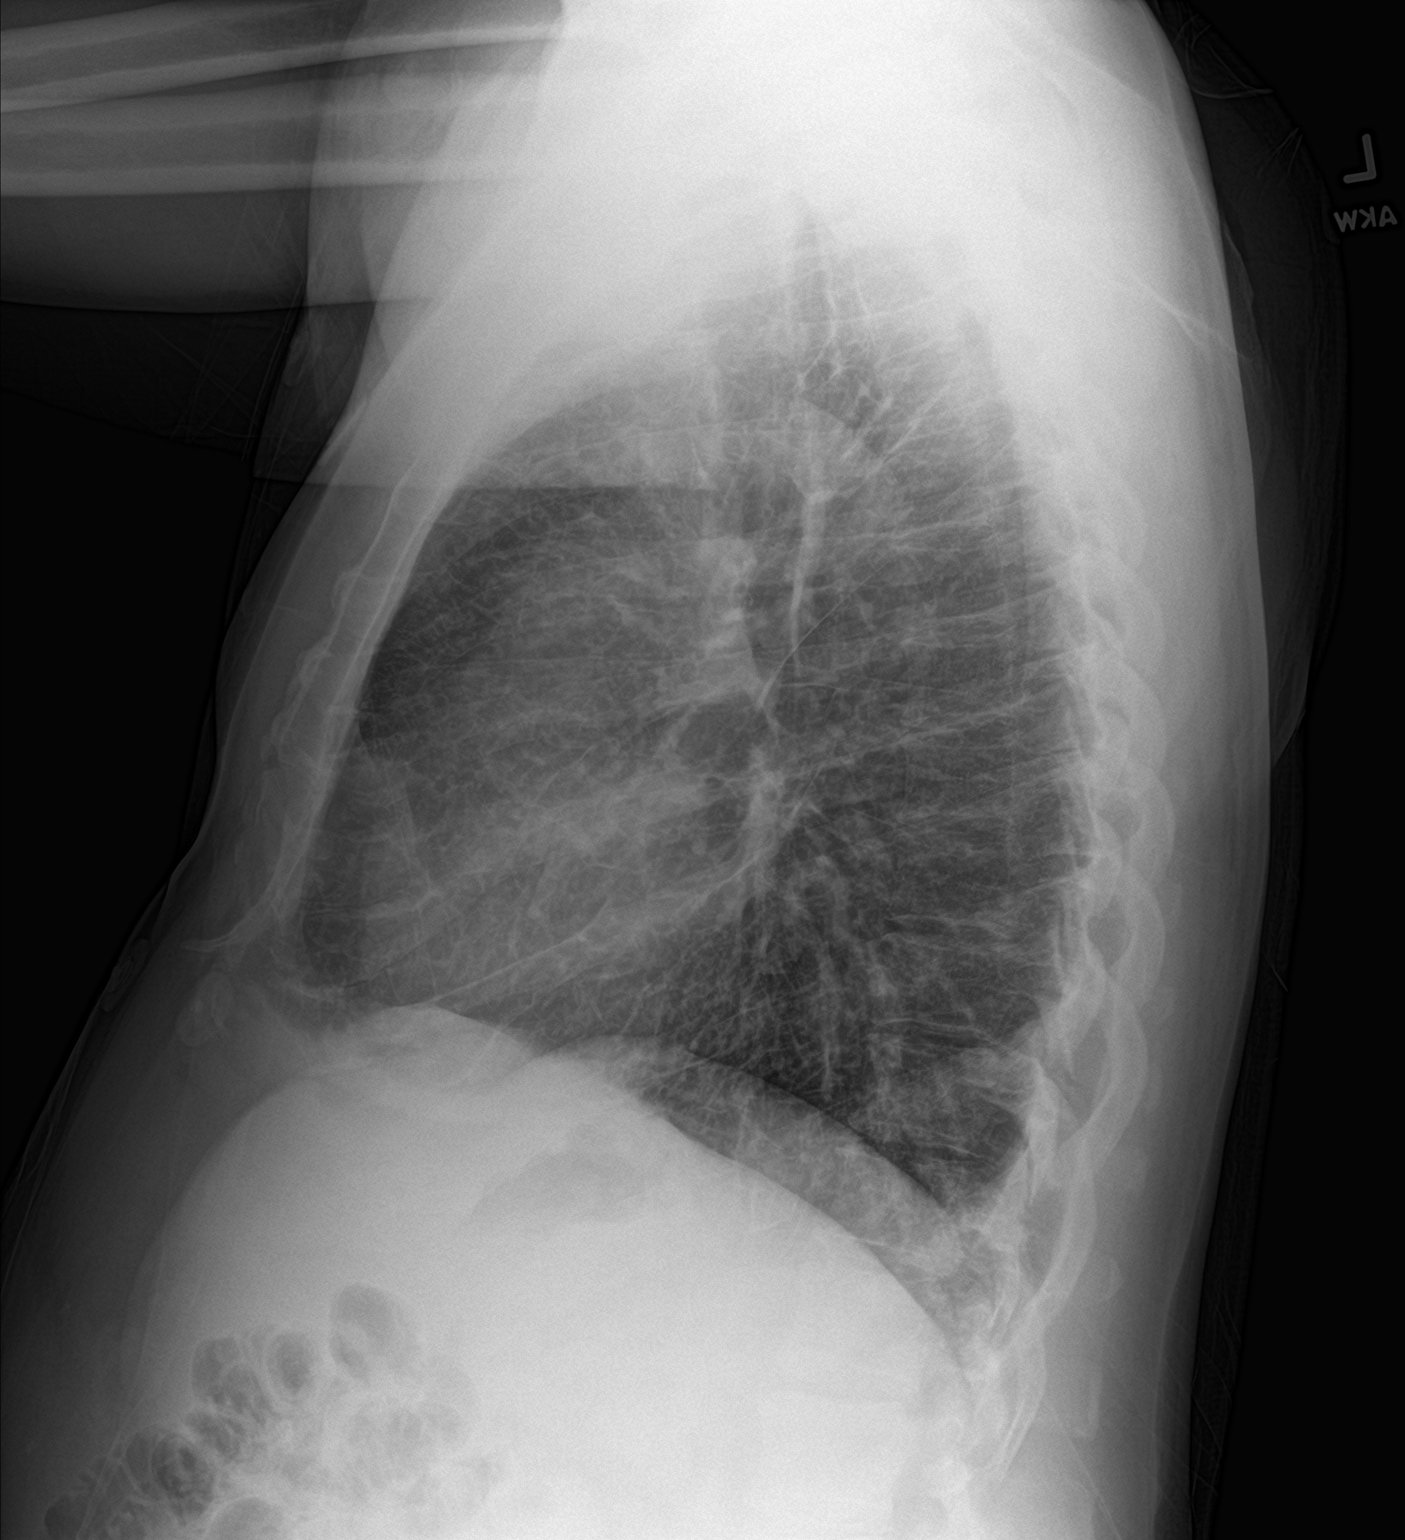

[2 of 2 positions shown; findings below may reference images not displayed]

FINDINGS: Normal heart size and mediastinal contours. Diffuse interstitial
opacity with Kerley lines. No pleural effusion or consolidation. No
pneumothorax
IMPRESSION: Interstitial edema.

## 2021-08-20 NOTE — Telephone Encounter (Signed)
PA for Lidocaine patches sent to insurance through CoverMyMeds ?

## 2021-08-21 NOTE — Progress Notes (Signed)
? ?Subjective:  ? ? Patient ID: William Crawford, male    DOB: September 06, 1970, 51 y.o.   MRN: [id number] ? ?HPI: ?An audio/video tele-health visit is felt to be the most appropriate encounter for this patient at this time. This is a follow up tele-visit via phone. The patient is at home. MD is at office. Prior to scheduling this appointment, our staff discussed the limitations of evaluation and management by telemedicine and the availability of in-person appointments. The patient expressed Crawford and agreed to proceed.  ? ? William Crawford is a 51 y.o. male who presents for follow-up of chronic low back pain, bilateral knee pain, chest pain, trigger finger, testosterone deficiency, and neck pain.  ? ?-currently pain is worse in both knees and his low back. He cannot say whether the testosterone supplement helps.  ?-been ok ?-he would like to know his vitamin D and testosterone level results and discuss treatment for his trigger finger ?-qutenza has helped minimize his chest pain, it is now gone. ?-had a death in the family- uncle had a pneumonia. He lives in [place]. He died before he could leave work. Her aunt is taking this pretty hard. He was really close to him. They used to talk 2-3 times per week.  ?-The knee pain is located just under the knee cap and extends medially to laterally ?-He is feeling much better this week and has been able to work, but still having significant knee and low back pain ?-He was unable to work today due to pain in both back and knees ?-Also has chest pain at times- sharp and lasts three minutes when he has it.  ?-Pain has grown excruciating but he has been able to return to work as he needs to financially. ?-Discussed results of his bilateral knee MRI, his neck XR, and his back XR ?-He continues to have chest pain and had recent follow-up with cardiology. Would still like to try the Qutenza patch ?-he has never tried Cymbalta and would be willing to ?-he requests if oxycodone  can be increased to 6 tablets per day.  ?-does not note radiation of pain into legs, but legs have given out dur to pain causing near fall ?-he does not note loss of bowel or bladder function ?-does note constipation ?-also having shooting pain down both arms into pinky fingers ?-discussed that bilateral knee pain and back pain could potentially be related to opioid-induced testosterone deficiency and he is agreeable to checking testosterone levels ?-he is also interested in ortho follow-up for his knee pain ?-he is feeling better today and was able to go to work ?-had burning pain over the weekend from the [place] that is now gone. Forgot to use the cooling gel. ?-he has never had PSA checked, discussed that result is 0.4 which is low, so we could trial testosterone patch given his deficiency and symptoms if he would like. He is agreeable and I have sent to his pharmacy.  ? ?Prior history: He states his pain is located in his left shoulder, lower back pain, left hip and bilateral knee pain. He was in contact with Dr Ranell Patrick regarding his increase intensity of pain, per Dr Ranell Patrick note his oxycodone will be increased to 5 times a day as needed for pain. William Crawford. ? ?He rates his pain 4. His current exercise regime is walking and performing stretching exercises. ? ?William Crawford Morphine equivalent is 60.00  MME.   Last UDs was Performed on 08/28/2020, it  was consistent.  ? ?He is having a good summer.  ? ?He fell down and hit his right ankle and got a big bruise- not hurting too bad.  ? ?He had a good time with his grandson riding rolling coasters at [employer] ? ?His chest pain returned and it feels sore and he plans to f/u with his surgeon regarding the pain. It feels sharp, hot, like a burning He takes Gabapentin 637m- but that doesn't help unless used with the oxycodone. ? ?Currently his pain is worse in bilateral knees- it is worst when he kneels, climbs ladders. He has a supportive  community at work which allow him to limit his kneeling and he has been using pads when he kneels. His oxycodone is now only lasting him about 2.5 hours after he takes it and he has been taking them 5 times per day. He asks if there is anything stronger he can take. He has tried Voltaren gel in the past and its effect usually started an hour after application.  ?-got compression sleeves to apply to his knees and has been able to work ?-at times his knee still buckles and this is painful ?-he feels he is getting used to the pain with walking but as far as climbing he feels it a lot more ?-he got a steroid injection by orthopedics and did find that it helped but did not last very long.  ?-he has been trying to eat more foods that reduce inflammation ? ?Low testosterone:  ?-he bought an over the counter supplement since the patch was not covered by insurance ?-takes magnesium, B1, zinc, vitamin D.  ?  ? ? ?Pain Inventory ?Average Pain 9 ?Pain Right Now 7 ?My pain is sharp, burning, stabbing, tingling, and aching ? ?In the last 24 hours, has pain interfered with the following? ?General activity 7 ?Relation with others 3 ?Enjoyment of life 5 ?What TIME of day is your pain at its worst? morning , daytime, and evening ?Sleep (in general) Fair ? ?Pain is worse with: bending and sitting ?Pain improves with: medication ?Relief from Meds: 8 ? ?Family History  ?Problem Relation Age of Onset  ? Heart attack Father   ? Heart disease Father   ? Heart attack Paternal Grandfather   ? Heart disease Paternal Grandfather   ? Heart attack Paternal Uncle   ? Heart disease Paternal Uncle   ? Heart attack Paternal Uncle   ? Heart disease Paternal Uncle   ? ?Social History  ? ?Socioeconomic History  ? Marital status: Single  ?  Spouse name: Not on file  ? Number of children: Not on file  ? Years of education: Not on file  ? Highest education level: Not on file  ?Occupational History  ? Not on file  ?Tobacco Use  ? Smoking status: Former  ?   Types: Cigarettes  ?  Quit date: 10/23/2019  ?  Years since quitting: 1.8  ? Smokeless tobacco: Never  ?Vaping Use  ? Vaping Use: Never used  ?Substance and Sexual Activity  ? Alcohol use: Not Currently  ?  Comment: QUIT  IN MAY [id number] ? Drug use: No  ? Sexual activity: Not on file  ?Other Topics Concern  ? Not on file  ?Social History Narrative  ? Not on file  ? ?Social Determinants of Health  ? ?Financial Resource Strain: Not on file  ?Food Insecurity: Not on file  ?Transportation Needs: Not on file  ?Physical Activity: Not on file  ?  Stress: Not on file  ?Social Connections: Not on file  ? ?Past Surgical History:  ?Procedure Laterality Date  ? APPLICATION OF WOUND VAC  10/25/2019  ? Procedure: Application Of Wound Vac;  Surgeon: Prescott Gum, Collier Salina, MD;  Location: [place];  Service: Open Heart Surgery;;  ? CANNULATION FOR ECMO (EXTRACORPOREAL MEMBRANE OXYGENATION)  10/25/2019  ? Procedure: Cannulation For Ecmo (Extracorporeal Membrane Oxygenation) @ 1191;  Surgeon: Prescott Gum, Collier Salina, MD;  Location: [place];  Service: Open Heart Surgery;;  ? CANNULATION FOR ECMO (EXTRACORPOREAL MEMBRANE OXYGENATION) N/A 10/30/2019  ? Procedure: DECANNULATION OF ECMO (EXTRACORPOREAL MEMBRANE OXYGENATION);  Surgeon: Prescott Gum, Collier Salina, MD;  Location: [place];  Service: Open Heart Surgery;  Laterality: N/A;  Pump standby  ? CHEST EXPLORATION  10/25/2019  ? Procedure: Chest Exploration for repair of coronary perforation (repair of LAD);  Surgeon: Prescott Gum, Collier Salina, MD;  Location: [place];  Service: Open Heart Surgery;;  ? CORONARY CTO INTERVENTION N/A 10/25/2019  ? Procedure: CORONARY CTO INTERVENTION;  Surgeon: Martinique, Peter M, MD;  Location: [place] [facility];  Service: Cardiovascular;  Laterality: N/A;  ? CORONARY STENT INTERVENTION N/A 10/17/2019  ? Procedure: CORONARY STENT INTERVENTION;  Surgeon: Sherren Mocha, MD;  Location: [place] [facility];  Service: Cardiovascular;  Laterality: N/A;  ? LEFT HEART CATH AND CORONARY ANGIOGRAPHY N/A 10/17/2019  ?  Procedure: LEFT HEART CATH AND CORONARY ANGIOGRAPHY;  Surgeon: Sherren Mocha, MD;  Location: [facility];  Service: Cardiovascular;  Laterality: N/A;  ? MEDIASTERNOTOMY  10/25/2019  ? Procedure: Med

## 2021-08-22 ENCOUNTER — Ambulatory Visit (INDEPENDENT_AMBULATORY_CARE_PROVIDER_SITE_OTHER): Payer: 59 | Admitting: Cardiovascular Disease

## 2021-08-22 ENCOUNTER — Telehealth: Payer: Self-pay | Admitting: Physical Medicine and Rehabilitation

## 2021-08-22 ENCOUNTER — Encounter: Payer: Self-pay | Admitting: Cardiovascular Disease

## 2021-08-22 VITALS — BP 130/90 | HR 80 | Ht 68.0 in | Wt 180.4 lb

## 2021-08-22 DIAGNOSIS — E782 Mixed hyperlipidemia: Secondary | ICD-10-CM | POA: Diagnosis not present

## 2021-08-22 DIAGNOSIS — I10 Essential (primary) hypertension: Secondary | ICD-10-CM

## 2021-08-22 DIAGNOSIS — I5022 Chronic systolic (congestive) heart failure: Secondary | ICD-10-CM

## 2021-08-22 DIAGNOSIS — I2510 Atherosclerotic heart disease of native coronary artery without angina pectoris: Secondary | ICD-10-CM | POA: Diagnosis not present

## 2021-08-22 MED ORDER — CARVEDILOL 6.25 MG PO TABS: 6.2500 mg | ORAL_TABLET | Freq: Two times a day (BID) | ORAL | 3 refills | Status: DC

## 2021-08-22 MED ORDER — ATORVASTATIN CALCIUM 20 MG PO TABS: 20.0000 mg | ORAL_TABLET | Freq: Every day | ORAL | 3 refills | Status: DC

## 2021-08-22 NOTE — Progress Notes (Signed)
?Cardiology Office Note:   ? ?Date:  08/24/2021  ? ?ID:  KRISHNA DANCEL, DOB 1971/01/16, MRN [id number] ? ?PCP:  [name], MD ?  ?[facility] Providers ?Cardiologist:  Sherren Mocha, MD ?Advanced Heart Failure:  Loralie Champagne, MD    ? ?Referring MD: [name], MD  ? ?Chief Complaint  ?Patient presents with  ? Coronary Artery Disease  ? ? ?History of Present Illness:   ? ?William Crawford is a 51 y.o. male with a hx of coronary artery disease, presenting for follow-up evaluation.  The patient has a complex history after initially presenting with non-STEMI in 2021.  He was treated with PCI of the right coronary artery and noted to have a chronic occlusion of the LAD.  He returned for CTO intervention in June 2021 with the procedure complicated by LAD perforation, pericardial effusion with hemopericardium and tamponade, and emergent cardiac surgery to repair the LAD perforation, drain the hemopericardium, and cannulate for ECMO.  He was later decannulated and a 5.5. L Impella pump was placed. The patient had a long complicated hospitalization with an excellent recovery.  He has been followed in the heart failure clinic.  His LVEF is essentially normalized.  He returns today for follow-up evaluation. ? ?The patient is here alone today. He has developed problems with back pain and knee pain. His pain management physician was concerned about whether his statin drug could be contributing to his chronic pain.  Overall the patient is doing well.  His breathing has greatly improved.  He still has some discomfort at times around his sternotomy but no exertional chest pain or pressure.  No orthopnea, PND, or heart palpitations.  He is compliant with his medications. ? ?Past Medical History:  ?Diagnosis Date  ? Alcohol abuse   ? Anxiety   ? CAD (coronary artery disease)   ? Cardiac arrest [facility])   ? Cardiogenic shock ([place])   ? CHF (congestive heart failure) ([place])   ? Cough   ? Dysrhythmia   ? GERD  (gastroesophageal reflux disease)   ? HFrEF (heart failure with reduced ejection fraction) ([place])   ? Hypertension   ? Hypertriglyceridemia   ? Ischemic cardiomyopathy   ? Right atrial thrombus   ? Tobacco abuse   ? ? ?Past Surgical History:  ?Procedure Laterality Date  ? APPLICATION OF WOUND VAC  10/25/2019  ? Procedure: Application Of Wound Vac;  Surgeon: Prescott Gum, Collier Salina, MD;  Location: [place];  Service: Open Heart Surgery;;  ? CANNULATION FOR ECMO (EXTRACORPOREAL MEMBRANE OXYGENATION)  10/25/2019  ? Procedure: Cannulation For Ecmo (Extracorporeal Membrane Oxygenation) @ 2671;  Surgeon: Prescott Gum, Collier Salina, MD;  Location: [place];  Service: Open Heart Surgery;;  ? CANNULATION FOR ECMO (EXTRACORPOREAL MEMBRANE OXYGENATION) N/A 10/30/2019  ? Procedure: DECANNULATION OF ECMO (EXTRACORPOREAL MEMBRANE OXYGENATION);  Surgeon: Prescott Gum, Collier Salina, MD;  Location: [place];  Service: Open Heart Surgery;  Laterality: N/A;  Pump standby  ? CHEST EXPLORATION  10/25/2019  ? Procedure: Chest Exploration for repair of coronary perforation (repair of LAD);  Surgeon: Prescott Gum, Collier Salina, MD;  Location: [place];  Service: Open Heart Surgery;;  ? CORONARY CTO INTERVENTION N/A 10/25/2019  ? Procedure: CORONARY CTO INTERVENTION;  Surgeon: Martinique, Peter M, MD;  Location: [place] [facility];  Service: Cardiovascular;  Laterality: N/A;  ? CORONARY STENT INTERVENTION N/A 10/17/2019  ? Procedure: CORONARY STENT INTERVENTION;  Surgeon: Sherren Mocha, MD;  Location: [place] [facility];  Service: Cardiovascular;  Laterality:  N/A;  ? LEFT HEART CATH AND CORONARY ANGIOGRAPHY N/A 10/17/2019  ? Procedure: LEFT HEART CATH AND CORONARY ANGIOGRAPHY;  Surgeon: Sherren Mocha, MD;  Location: [place] [facility];  Service: Cardiovascular;  Laterality: N/A;  ? MEDIASTERNOTOMY  10/25/2019  ? Procedure: Median Sternotomy.;  Surgeon: [name], MD;  Location: [place];  Service: Open Heart Surgery;;  ? PLACEMENT OF [place] LEFT VENTRICULAR ASSIST DEVICE  10/30/2019  ? Procedure:  Placement Of Impella Left Ventricular Assist Device 5.5 USING 10MM HEMASHIELD PLATINUM GRAFT;  Surgeon: [name], MD;  Location: [place];  Service: Open Heart Surgery;;  graft placed into aorta  ? REMOVAL OF IMPELLA LEFT VENTRICULAR ASSIST DEVICE N/A 11/03/2019  ? Procedure: REMOVAL OF IMPELLA 5.5 LEFT VENTRICULAR ASSIST DEVICE;  Surgeon: [name], MD;  Location: [place];  Service: Open Heart Surgery;  Laterality: N/A;  ? STERNAL CLOSURE N/A 10/30/2019  ? Procedure: Sternal Closure;  Surgeon: [name], MD;  Location: [place];  Service: Open Heart Surgery;  Laterality: N/A;  ? STERNAL WIRES REMOVAL N/A 05/14/2020  ? Procedure: STERNAL WIRES REMOVAL;  Surgeon: [name], MD;  Location: [place];  Service: Thoracic;  Laterality: N/A;  ? TEE WITHOUT CARDIOVERSION N/A 10/25/2019  ? Procedure: TRANSESOPHAGEAL ECHOCARDIOGRAM (TEE);  Surgeon: Prescott Gum, Collier Salina, MD;  Location: [place];  Service: Open Heart Surgery;  Laterality: N/A;  ? TEE WITHOUT CARDIOVERSION N/A 10/30/2019  ? Procedure: TRANSESOPHAGEAL ECHOCARDIOGRAM (TEE);  Surgeon: Prescott Gum, Collier Salina, MD;  Location: [place];  Service: Open Heart Surgery;  Laterality: N/A;  ? TEE WITHOUT CARDIOVERSION N/A 11/03/2019  ? Procedure: TRANSESOPHAGEAL ECHOCARDIOGRAM (TEE);  Surgeon: Prescott Gum, Collier Salina, MD;  Location: [place];  Service: Open Heart Surgery;  Laterality: N/A;  ? ? ?Current Medications: ?Current Meds  ?Medication Sig  ? Ascorbic Acid (VITAMIN C) 100 MG tablet Take 100 mg by mouth daily.  ? atorvastatin (LIPITOR) 20 MG tablet Take 1 tablet (20 mg total) by mouth daily.  ? carvedilol (COREG) 6.25 MG tablet Take 1 tablet (6.25 mg total) by mouth 2 (two) times daily.  ? Cholecalciferol (VITAMIN D-3) 125 MCG (5000 UT) TABS Take 1 tablet by mouth daily at 12 noon.  ? clopidogrel (PLAVIX) 75 MG tablet TAKE 1 TABLET(75 MG) BY MOUTH DAILY  ? DULoxetine (CYMBALTA) 20 MG capsule Take by mouth.  ? fluticasone (FLONASE) 50 MCG/ACT nasal spray Place 2 sprays into both  nostrils daily.  ? gabapentin (NEURONTIN) 600 MG tablet Take 1 tablet (600 mg total) by mouth 3 (three) times daily.  ? icosapent Ethyl (VASCEPA) 1 g capsule TAKE 2 CAPSULES(2 GRAMS) BY MOUTH TWICE DAILY  ? lidocaine (LIDODERM) 5 % Place 1 patch onto the skin daily. Remove & Discard patch within 12 hours or as directed by MD  ? Misc Natural Products (OSTEO BI-FLEX ADV JOINT SHIELD) TABS Take 2 tablets by mouth.  ? oxyCODONE-acetaminophen (PERCOCET) 10-325 MG tablet Take 1 tablet by mouth every 4 (four) hours as needed for pain. Please Fill Today  ? Pregabalin (LYRICA PO) Lyrica  ? sacubitril-valsartan (ENTRESTO) 49-51 MG Take 1 tablet by mouth 2 (two) times daily.  ? SPIRONOLACTONE PO Spironolactone  ? Testosterone 2 MG/24HR PT24 Place 1 each onto the skin at bedtime.  ? thiamine 100 MG tablet Take 1 tablet by mouth daily.  ? traZODone (DESYREL) 100 MG tablet Take 1 tablet (100 mg total) by mouth at bedtime.  ? Vitamins C E (VITAMIN C & E COMPLEX PO) Take  by mouth daily at 12 noon.  ? [DISCONTINUED] atorvastatin (LIPITOR) 40 MG tablet TAKE 1 TABLET(40 MG) BY MOUTH DAILY  ? [DISCONTINUED] carvedilol (COREG) 3.125 MG tablet Take by mouth.  ?  ? ?Allergies:   Patient has no known allergies.  ? ?Social History  ? ?Socioeconomic History  ? Marital status: Single  ?  Spouse name: Not on file  ? Number of children: Not on file  ? Years of education: Not on file  ? Highest education level: Not on file  ?Occupational History  ? Not on file  ?Tobacco Use  ? Smoking status: Former  ?  Types: Cigarettes  ?  Quit date: 10/23/2019  ?  Years since quitting: 1.8  ? Smokeless tobacco: Never  ?Vaping Use  ? Vaping Use: Never used  ?Substance and Sexual Activity  ? Alcohol use: Not Currently  ?  Comment: QUIT  IN MAY [id number]  ? Drug use: No  ? Sexual activity: Not on file  ?Other Topics Concern  ? Not on file  ?Social History Narrative  ? Not on file  ? ?Social Determinants of Health  ? ?Financial Resource Strain: Not on file  ?Food  Insecurity: Not on file  ?Transportation Needs: Not on file  ?Physical Activity: Not on file  ?Stress: Not on file  ?Social Connections: Not on file  ?  ? ?Family History: ?The patient's family history includes Heart attack in [place]

## 2021-08-22 NOTE — Patient Instructions (Signed)
Medication Instructions:  ?INCREASE Carvedilol (Coreg) to 6.54m twice daily ?DECREASE Atorvastatin (Lipitor) to 269mdaily ?*If you need a refill on your cardiac medications before your next appointment, please call your pharmacy* ? ? ?Lab Work: ?CBC, CMET, LIPIDS ?If you have labs (blood work) drawn today and your tests are completely normal, you will receive your results only by: ?MyChart Message (if you have MyChart) OR ?A paper copy in the mail ?If you have any lab test that is abnormal or we need to change your treatment, we will call you to review the results. ? ? ?Testing/Procedures: ?NONE ? ? ?Follow-Up: ?[facility].  As part of our continuing mission to provide you with exceptional heart care, we have created designated Provider Care Teams.  These Care Teams include your primary Cardiologist (physician) and Advanced Practice Providers (APPs -  Physician Assistants and Nurse Practitioners) who all work together to provide you with the care you need, when you need it. ? ?Your next appointment:   ?As needed  ? ?Provider:   ? ?MiSherren MochaMD ? ? ? ?  ?

## 2021-08-22 NOTE — Telephone Encounter (Signed)
Dr Orlene Erm [phone] left voicemail late Thursday [id number] requested a peer to peer regarding medication for ptn.  Left voicemail for MD stating KR is out of office until [phone]-- if someone in clinic can respond to a question if MD peer to peer needed she returns [phone] ?

## 2021-08-22 NOTE — Telephone Encounter (Signed)
PA Case: [id number], Status: Denied, Denial Rationale: Coverage for LIDOCAINE 5% PATCH is denied. It does not meet the plan criteria for your diagnosis. The information provided by your prescriber does not meet [employer]'s guideline. The guideline used is: Topical Lidocaine Prior Authorization with Quantity Limit Criteria. Information provided does not show that the policy requirements have been met. The requirement(s) not met are: Your plan requires a diagnosis of Pain associated with post-herpetic neuralgia (PHN), Neuropathic pain associated with cancer or cancer treatment, or Another FDA approved or compendia supported indication for the requested agent and route of administration. The information submitted does not give this required diagnosis. Therefore, coverage for LIDOCAINE 5% PATCH is denied. Questions? Contact [phone]. ?

## 2021-08-23 LAB — COMPREHENSIVE METABOLIC PANEL
ALT: 18 IU/L (ref 0–44)
AST: 19 IU/L (ref 0–40)
Albumin/Globulin Ratio: 2.6 — ABNORMAL HIGH (ref 1.2–2.2)
Albumin: 5.1 g/dL — ABNORMAL HIGH (ref 4.0–5.0)
Alkaline Phosphatase: 81 IU/L (ref 44–121)
BUN/Creatinine Ratio: 7 — ABNORMAL LOW (ref 9–20)
BUN: 7 mg/dL (ref 6–24)
Bilirubin Total: 0.4 mg/dL (ref 0.0–1.2)
CO2: 24 mmol/L (ref 20–29)
Calcium: 9.3 mg/dL (ref 8.7–10.2)
Chloride: 105 mmol/L (ref 96–106)
Creatinine, Ser: 1.01 mg/dL (ref 0.76–1.27)
Globulin, Total: 2 g/dL (ref 1.5–4.5)
Glucose: 91 mg/dL (ref 70–99)
Potassium: 4.7 mmol/L (ref 3.5–5.2)
Sodium: 145 mmol/L — ABNORMAL HIGH (ref 134–144)
Total Protein: 7.1 g/dL (ref 6.0–8.5)
eGFR: 92 mL/min/{1.73_m2} (ref >59)

## 2021-08-23 LAB — LIPID PANEL
Chol/HDL Ratio: 2.9 ratio (ref 0.0–5.0)
Cholesterol, Total: 113 mg/dL (ref 100–199)
HDL: 39 mg/dL — ABNORMAL LOW (ref >39)
LDL Chol Calc (NIH): 53 mg/dL (ref 0–99)
Triglycerides: 114 mg/dL (ref 0–149)
VLDL Cholesterol Cal: 21 mg/dL (ref 5–40)

## 2021-08-23 LAB — CBC
Hematocrit: 41.8 % (ref 37.5–51.0)
Hemoglobin: 14.3 g/dL (ref 13.0–17.7)
MCH: 29.9 pg (ref 26.6–33.0)
MCHC: 34.2 g/dL (ref 31.5–35.7)
MCV: 87 fL (ref 79–97)
Platelets: 335 10*3/uL (ref 150–450)
RBC: 4.79 x10E6/uL (ref 4.14–5.80)
RDW: 11.8 % (ref 11.6–15.4)
WBC: 7.4 10*3/uL (ref 3.4–10.8)

## 2021-08-24 ENCOUNTER — Encounter: Payer: Self-pay | Admitting: Cardiovascular Disease

## 2021-08-30 ENCOUNTER — Other Ambulatory Visit: Payer: Self-pay | Admitting: Registered Nurse

## 2021-09-01 ENCOUNTER — Other Ambulatory Visit: Payer: Self-pay | Admitting: Registered Nurse

## 2021-09-11 IMAGING — DX DG CHEST 1V PORT
1 series · 1 of 1 positions shown · non-contrast
Comparison: 1 day prior

CLINICAL DATA: Status post median sternotomy with chest tube
removal yesterday.

EXAM:
PORTABLE CHEST 1 VIEW

[chest ap]
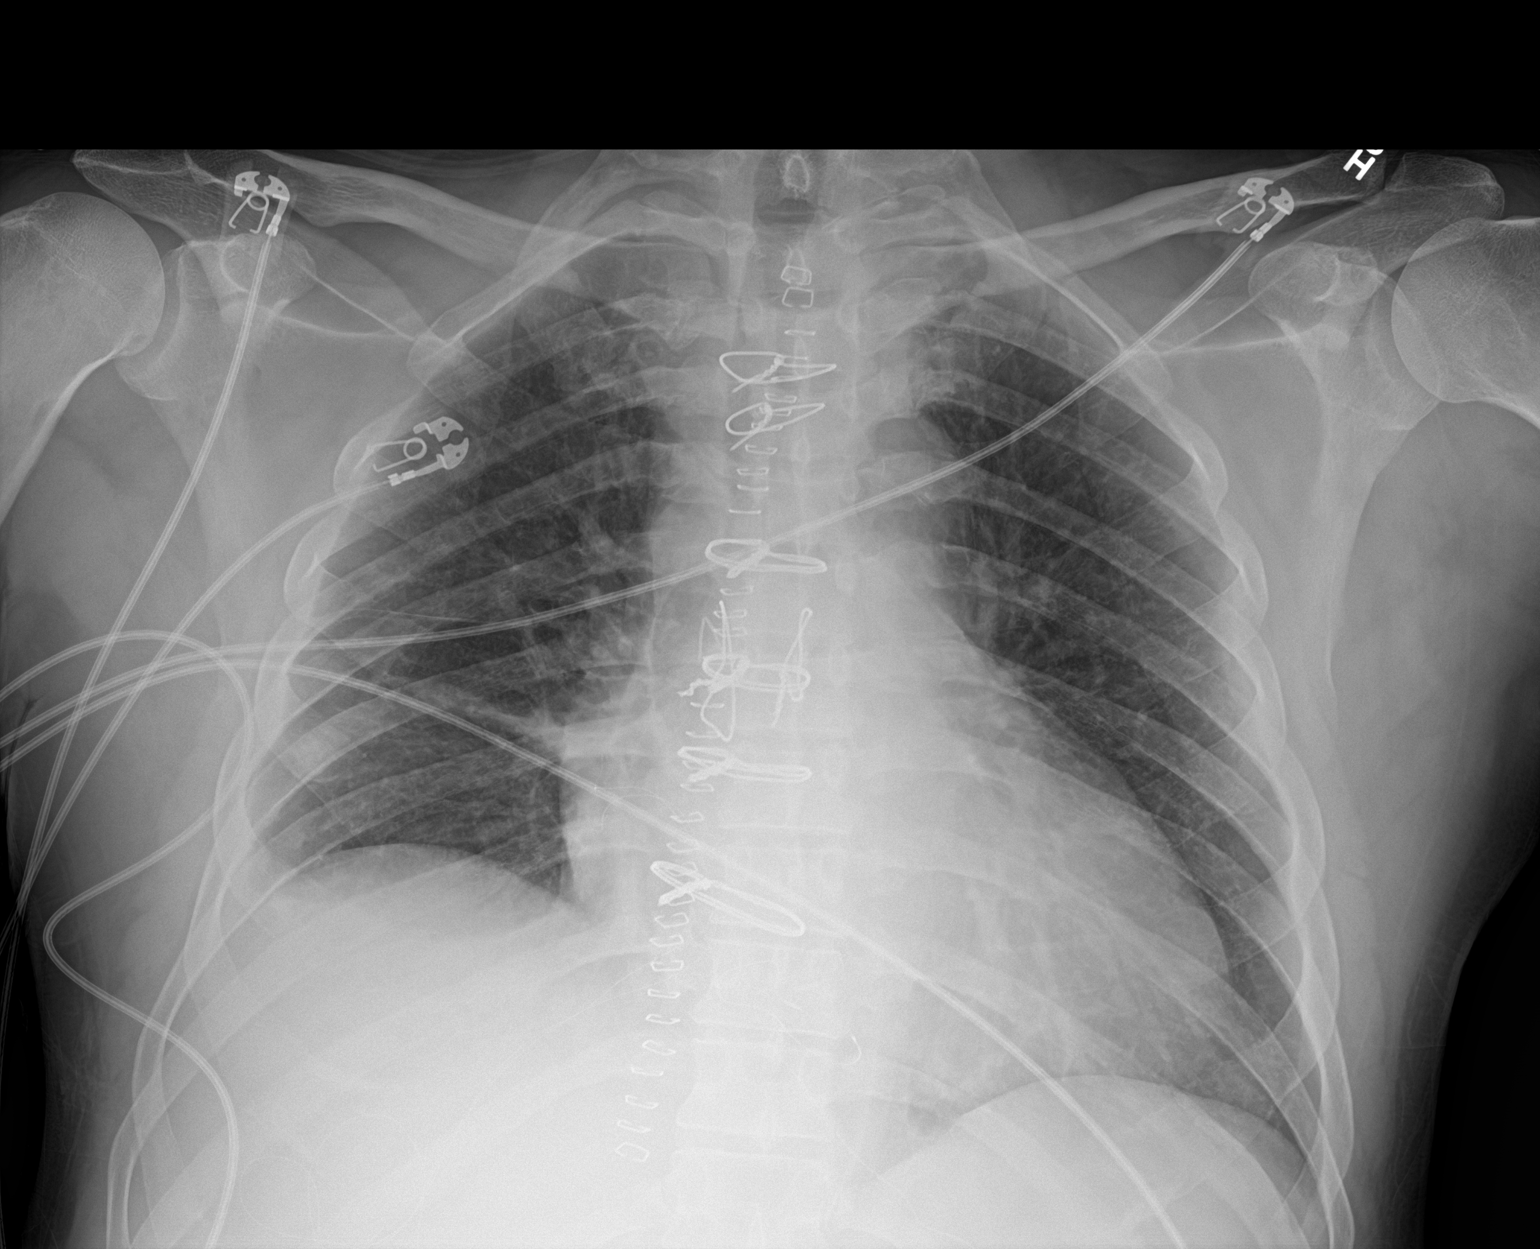

[1 of 1 positions shown; findings below may reference images not displayed]

FINDINGS: Removal of right-sided chest tube. Median sternotomy with midline
surgical staples. Midline trachea. Borderline cardiomegaly. Possible
trace right pleural fluid. No pneumothorax. No congestive failure.
Right hemidiaphragm elevation. Improved right infrahilar
atelectasis.
IMPRESSION: 1. Removal of right chest tube, without pneumothorax.
2. Improved right infrahilar atelectasis.

## 2021-09-14 IMAGING — DX DG CHEST 1V PORT
1 series · 1 of 1 positions shown · non-contrast
Comparison: None.

CLINICAL DATA: Postop pain, broken ribs

EXAM:
PORTABLE CHEST 1 VIEW

[chest]
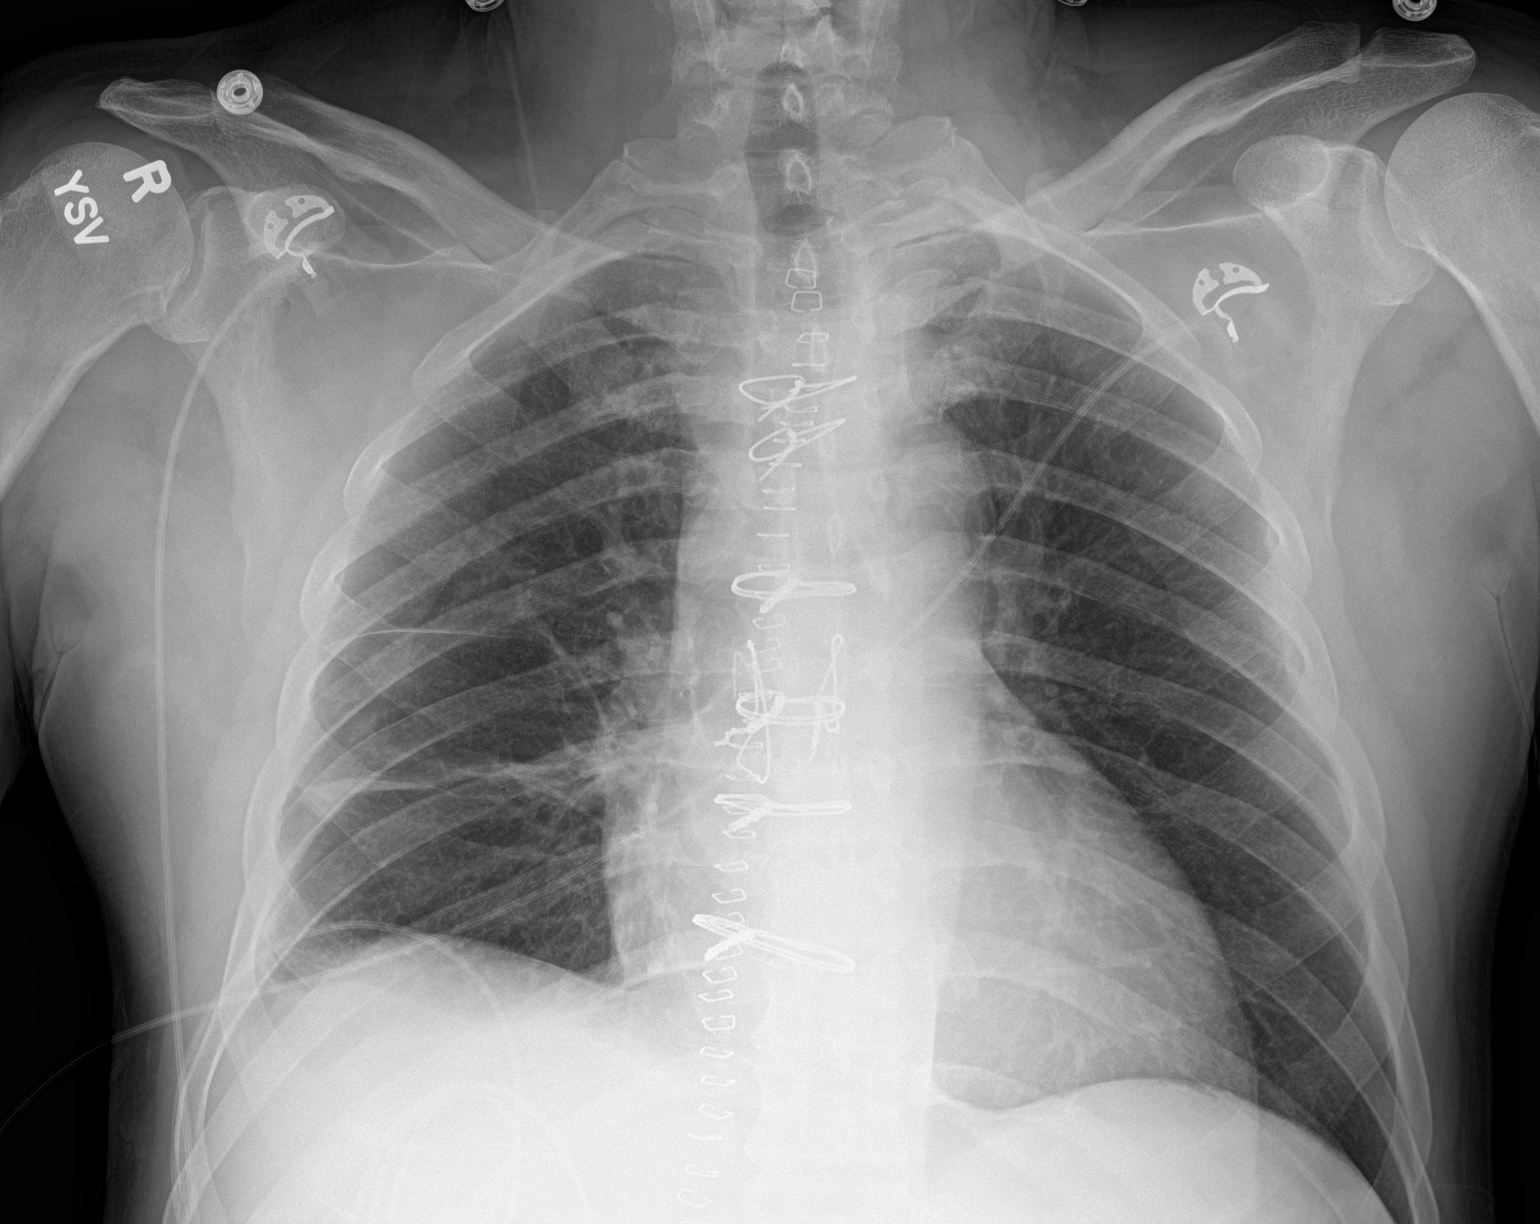

[1 of 1 positions shown; findings below may reference images not displayed]

FINDINGS: Midline sternotomy. Midline skin staples. Normal cardiac silhouette.
Band of atelectasis in the RIGHT lung. Small RIGHT effusion.
Improved atelectasis the RIGHT lung base. No pneumothorax.
IMPRESSION: 1. Improved aeration at the RIGHT lung base.
2. Small RIGHT effusion and mild atelectasis.

## 2021-09-15 ENCOUNTER — Encounter: Payer: 59 | Attending: Physical Medicine and Rehabilitation | Admitting: Physical Medicine and Rehabilitation

## 2021-09-15 DIAGNOSIS — G8929 Other chronic pain: Secondary | ICD-10-CM | POA: Insufficient documentation

## 2021-09-15 DIAGNOSIS — G894 Chronic pain syndrome: Secondary | ICD-10-CM | POA: Diagnosis not present

## 2021-09-15 DIAGNOSIS — Z79899 Other long term (current) drug therapy: Secondary | ICD-10-CM | POA: Insufficient documentation

## 2021-09-15 DIAGNOSIS — M545 Low back pain, unspecified: Secondary | ICD-10-CM | POA: Insufficient documentation

## 2021-09-15 DIAGNOSIS — Z5181 Encounter for therapeutic drug level monitoring: Secondary | ICD-10-CM | POA: Insufficient documentation

## 2021-09-15 DIAGNOSIS — M170 Bilateral primary osteoarthritis of knee: Secondary | ICD-10-CM | POA: Insufficient documentation

## 2021-09-15 NOTE — Progress Notes (Signed)
? ?Subjective:  ? ? Patient ID: William Crawford, male    DOB: 07-Apr-1971, 51 y.o.   MRN: [id number] ? ?HPI: ?An audio/video tele-health visit is felt to be the most appropriate encounter for this patient at this time. This is a follow up tele-visit via phone. The patient is at home. MD is at office. Prior to scheduling this appointment, our staff discussed the limitations of evaluation and management by telemedicine and the availability of in-person appointments. The patient expressed understanding and agreed to proceed.  ? ? William Crawford is a 51 y.o. male who presents for follow-up of chronic low back pain, bilateral knee pain, chest pain, trigger finger, testosterone deficiency, and neck pain.  ? ?-currently pain is worse in both knees and his low back. He cannot say whether the testosterone supplement helps.  ?-he discussed with his cardiologist decreasing his atorvastatin dose and it has been decreased to 57m. He has not had any improvements in pain since this change, if fact pain has worsened ?-he had a busy day yesterday working on his parents' [employer] [place] sure it was safe for them to drive their car. He feels that this may have contributed to his worsening pain ?-been ok ?-he would like to know his vitamin D and testosterone level results and discuss treatment for his trigger finger ?-qutenza has helped minimize his chest pain, it is now gone. ?-had a death in the family- uncle had a pneumonia. [place] He died before he could leave work. Her aunt is taking this pretty hard. He was really close to him. They used to talk 2-3 times per week.  ?-The knee pain is located just under the knee cap and extends medially to laterally ?-He is feeling much better this week and has been able to work, but still having significant knee and low back pain ?-He was unable to work today due to pain in both back and knees ?-Also has chest pain  at times- sharp and lasts three minutes when he has it.  ?-Pain has grown excruciating but he has been able to return to work as he needs to financially. ?-Discussed results of his bilateral knee MRI, his neck XR, and his back XR ?-He continues to have chest pain and had recent follow-up with cardiology. Would still like to try the Qutenza patch ?-he has never tried Cymbalta and would be willing to ?-he requests if oxycodone can be increased to 6 tablets per day.  ?-does not note radiation of pain into legs, but legs have given out dur to pain causing near fall ?-he does not note loss of bowel or bladder function ?-does note constipation ?-also having shooting pain down both arms into pinky fingers ?-discussed that bilateral knee pain and back pain could potentially be related to opioid-induced testosterone deficiency and he is agreeable to checking testosterone levels ?-he is also interested in ortho follow-up for his knee pain ?-he is feeling better today and was able to go to work ?-had burning pain over the weekend from the [place] is now [place]. Forgot to use the cooling gel. ?-he has never had PSA checked, discussed that result is 0.4 which is low, so we could trial testosterone patch given his deficiency and symptoms if he would like. He is agreeable and I have sent to his pharmacy.  ? ?Prior history: He states his pain is located in his left shoulder,  lower back pain, left hip and bilateral knee pain. He was in contact with Dr Ranell Patrick regarding his increase intensity of pain, per Dr Ranell Patrick note his oxycodone will be increased to 5 times a day as needed for pain. [name] understanding. ? ?He rates his pain 4. His current exercise regime is walking and performing stretching exercises. ? ?[name] Morphine equivalent is 60.00  MME.   Last UDs was Performed on 08/28/2020, it was consistent.  ? ?He is having a good summer.  ? ?He fell down and hit his right ankle and got a big bruise- not  hurting too bad.  ? ?He had a good time with his grandson riding rolling coasters at [employer] ? ?His chest pain returned and it feels sore and he plans to f/u with his surgeon regarding the pain. It feels sharp, hot, like a burning He takes Gabapentin 614m- but that doesn't help unless used with the oxycodone. ? ?Currently his pain is worse in bilateral knees- it is worst when he kneels, climbs ladders. He has a supportive community at work which allow him to limit his kneeling and he has been using pads when he kneels. His oxycodone is now only lasting him about 2.5 hours after he takes it and he has been taking them 5 times per day. He asks if there is anything stronger he can take. He has tried Voltaren gel in the past and its effect usually started an hour after application.  ?-got compression sleeves to apply to his knees and has been able to work ?-at times his knee still buckles and this is painful ?-he feels he is getting used to the pain with walking but as far as climbing he feels it a lot more ?-he got a steroid injection by orthopedics and did find that it helped but did not last very long.  ?-he has been trying to eat more foods that reduce inflammation ? ?Low testosterone:  ?-he bought an over the counter supplement since the patch was not covered by insurance ?-takes magnesium, B1, zinc, vitamin D.  ?  ? ? ?Pain Inventory ?Average Pain 9 ?Pain Right Now 7 ?My pain is sharp, burning, stabbing, tingling, and aching ? ?In the last 24 hours, has pain interfered with the following? ?General activity 7 ?Relation with others 3 ?Enjoyment of life 5 ?What TIME of day is your pain at its worst? morning , daytime, and evening ?Sleep (in general) Fair ? ?Pain is worse with: bending and sitting ?Pain improves with: medication ?Relief from Meds: 8 ? ?Family History  ?Problem Relation Age of Onset  ? Heart attack Father   ? Heart disease Father   ? Heart attack Paternal Grandfather   ? Heart disease Paternal  Grandfather   ? Heart attack Paternal Uncle   ? Heart disease Paternal Uncle   ? Heart attack Paternal Uncle   ? Heart disease Paternal Uncle   ? ?Social History  ? ?Socioeconomic History  ? Marital status: Single  ?  Spouse name: Not on file  ? Number of children: Not on file  ? Years of education: Not on file  ? Highest education level: Not on file  ?Occupational History  ? Not on file  ?Tobacco Use  ? Smoking status: Former  ?  Types: Cigarettes  ?  Quit date: 10/23/2019  ?  Years since quitting: 1.8  ? Smokeless tobacco: Never  ?Vaping Use  ? Vaping Use: Never used  ?Substance and Sexual  Activity  ? Alcohol use: Not Currently  ?  Comment: QUIT  IN MAY [id number]  ? Drug use: No  ? Sexual activity: Not on file  ?Other Topics Concern  ? Not on file  ?Social History Narrative  ? Not on file  ? ?Social Determinants of Health  ? ?Financial Resource Strain: Not on file  ?Food Insecurity: Not on file  ?Transportation Needs: Not on file  ?Physical Activity: Not on file  ?Stress: Not on file  ?Social Connections: Not on file  ? ?Past Surgical History:  ?Procedure Laterality Date  ? APPLICATION OF WOUND VAC  10/25/2019  ? Procedure: Application Of Wound Vac;  Surgeon: Prescott Gum, Collier Salina, MD;  Location: [place];  Service: Open Heart Surgery;;  ? CANNULATION FOR ECMO (EXTRACORPOREAL MEMBRANE OXYGENATION)  10/25/2019  ? Procedure: Cannulation For Ecmo (Extracorporeal Membrane Oxygenation) @ 8280;  Surgeon: Prescott Gum, Collier Salina, MD;  Location: [place];  Service: Open Heart Surgery;;  ? CANNULATION FOR ECMO (EXTRACORPOREAL MEMBRANE OXYGENATION) N/A 10/30/2019  ? Procedure: DECANNULATION OF ECMO (EXTRACORPOREAL MEMBRANE OXYGENATION);  Surgeon: Prescott Gum, Collier Salina, MD;  Location: [place];  Service: Open Heart Surgery;  Laterality: N/A;  Pump standby  ? CHEST EXPLORATION  10/25/2019  ? Procedure: Chest Exploration for repair of coronary perforation (repair of LAD);  Surgeon: Prescott Gum, Collier Salina, MD;  Location: [place];  Service: Open Heart Surgery;;  ? CORONARY  CTO INTERVENTION N/A 10/25/2019  ? Procedure: CORONARY CTO INTERVENTION;  Surgeon: Martinique, Peter M, MD;  Location: [place] [facility];  Service: Cardiovascular;  Laterality: N/A;  ? CORONARY STENT INTERVENTION

## 2021-09-17 ENCOUNTER — Other Ambulatory Visit (HOSPITAL_COMMUNITY): Payer: Self-pay | Admitting: Cardiology

## 2021-09-17 MED ORDER — ICOSAPENT ETHYL 1 G PO CAPS: ORAL_CAPSULE | ORAL | 5 refills | Status: DC

## 2021-09-19 ENCOUNTER — Encounter: Payer: Self-pay | Admitting: Registered Nurse

## 2021-09-19 ENCOUNTER — Encounter: Payer: 59 | Admitting: Registered Nurse

## 2021-09-19 VITALS — BP 129/77 | HR 73 | Ht 68.0 in | Wt 181.0 lb

## 2021-09-19 DIAGNOSIS — G8929 Other chronic pain: Secondary | ICD-10-CM

## 2021-09-19 DIAGNOSIS — M545 Low back pain, unspecified: Secondary | ICD-10-CM | POA: Diagnosis not present

## 2021-09-19 DIAGNOSIS — Z5181 Encounter for therapeutic drug level monitoring: Secondary | ICD-10-CM | POA: Diagnosis not present

## 2021-09-19 DIAGNOSIS — M170 Bilateral primary osteoarthritis of knee: Secondary | ICD-10-CM | POA: Diagnosis present

## 2021-09-19 DIAGNOSIS — G894 Chronic pain syndrome: Secondary | ICD-10-CM

## 2021-09-19 DIAGNOSIS — Z79899 Other long term (current) drug therapy: Secondary | ICD-10-CM

## 2021-09-19 MED ORDER — OXYCODONE-ACETAMINOPHEN 10-325 MG PO TABS: 1.0000 | ORAL_TABLET | ORAL | 0 refills | Status: DC | PRN

## 2021-09-19 NOTE — Progress Notes (Signed)
? ?Subjective:  ? ? Patient ID: William Crawford, male    DOB: [birth year], 51 y.o.   MRN: [id number] ? ?HPI: William Crawford is a 51 y.o. male who returns for follow up appointment for chronic pain and medication refill. He states his pain is located in his lower back and bilateral knee pain. He rates his pain 7. His current exercise regime is walking and performing stretching exercises. ? ?Mr. Pichette Morphine equivalent is 90.00 MME.   Last UDS was Performed on 06/26/2021, it was consistent.  ?  ? ?Pain Inventory ?Average Pain 8 ?Pain Right Now 7 ?My pain is intermittent, sharp, burning, stabbing, and aching ? ?In the last 24 hours, has pain interfered with the following? ?General activity 5 ?Relation with others 5 ?Enjoyment of life 5 ?What TIME of day is your pain at its worst? morning  and evening ?Sleep (in general) Fair ? ?Pain is worse with: walking, bending, and inactivity ?Pain improves with: rest and medication ?Relief from Meds: 8 ? ?Family History  ?Problem Relation Age of Onset  ? Heart attack Father   ? Heart disease Father   ? Heart attack Paternal Grandfather   ? Heart disease Paternal Grandfather   ? Heart attack Paternal Uncle   ? Heart disease Paternal Uncle   ? Heart attack Paternal Uncle   ? Heart disease Paternal Uncle   ? ?Social History  ? ?Socioeconomic History  ? Marital status: Single  ?  Spouse name: Not on file  ? Number of children: Not on file  ? Years of education: Not on file  ? Highest education level: Not on file  ?Occupational History  ? Not on file  ?Tobacco Use  ? Smoking status: Former  ?  Types: Cigarettes  ?  Quit date: 10/23/2019  ?  Years since quitting: 1.9  ? Smokeless tobacco: Never  ?Vaping Use  ? Vaping Use: Never used  ?Substance and Sexual Activity  ? Alcohol use: Not Currently  ?  Comment: QUIT  IN MAY [id number]  ? Drug use: No  ? Sexual activity: Not on file  ?Other Topics Concern  ? Not on file  ?Social History Narrative  ? Not on file  ? ?Social Determinants of  Health  ? ?Financial Resource Strain: Not on file  ?Food Insecurity: Not on file  ?Transportation Needs: Not on file  ?Physical Activity: Not on file  ?Stress: Not on file  ?Social Connections: Not on file  ? ?Past Surgical History:  ?Procedure Laterality Date  ? APPLICATION OF WOUND VAC  10/25/2019  ? Procedure: Application Of Wound Vac;  Surgeon: Prescott Gum, Collier Salina, MD;  Location: [place];  Service: Open Heart Surgery;;  ? CANNULATION FOR ECMO (EXTRACORPOREAL MEMBRANE OXYGENATION)  10/25/2019  ? Procedure: Cannulation For Ecmo (Extracorporeal Membrane Oxygenation) @ 6962;  Surgeon: Prescott Gum, Collier Salina, MD;  Location: [place];  Service: Open Heart Surgery;;  ? CANNULATION FOR ECMO (EXTRACORPOREAL MEMBRANE OXYGENATION) N/A 10/30/2019  ? Procedure: DECANNULATION OF ECMO (EXTRACORPOREAL MEMBRANE OXYGENATION);  Surgeon: Prescott Gum, Collier Salina, MD;  Location: [place];  Service: Open Heart Surgery;  Laterality: N/A;  Pump standby  ? CHEST EXPLORATION  10/25/2019  ? Procedure: Chest Exploration for repair of coronary perforation (repair of LAD);  Surgeon: Prescott Gum, Collier Salina, MD;  Location: [place];  Service: Open Heart Surgery;;  ? CORONARY CTO INTERVENTION N/A 10/25/2019  ? Procedure: CORONARY CTO INTERVENTION;  Surgeon: Martinique, Peter M, MD;  Location: [place] [facility];  Service:  Cardiovascular;  Laterality: N/A;  ? CORONARY STENT INTERVENTION N/A 10/17/2019  ? Procedure: CORONARY STENT INTERVENTION;  Surgeon: Sherren Mocha, MD;  Location: [place] [facility];  Service: Cardiovascular;  Laterality: N/A;  ? LEFT HEART CATH AND CORONARY ANGIOGRAPHY N/A 10/17/2019  ? Procedure: LEFT HEART CATH AND CORONARY ANGIOGRAPHY;  Surgeon: Sherren Mocha, MD;  Location: [place] [facility];  Service: Cardiovascular;  Laterality: N/A;  ? MEDIASTERNOTOMY  10/25/2019  ? Procedure: Median Sternotomy.;  Surgeon: [name], MD;  Location: [place];  Service: Open Heart Surgery;;  ? PLACEMENT OF [place] LEFT VENTRICULAR ASSIST DEVICE  10/30/2019  ? Procedure: Placement  Of [place] Left Ventricular Assist Device 5.5 USING 10MM HEMASHIELD PLATINUM GRAFT;  Surgeon: [name], MD;  Location: [place];  Service: Open Heart Surgery;;  graft placed into aorta  ? REMOVAL OF IMPELLA LEFT VENTRICULAR ASSIST DEVICE N/A 11/03/2019  ? Procedure: REMOVAL OF IMPELLA 5.5 LEFT VENTRICULAR ASSIST DEVICE;  Surgeon: [name], MD;  Location: [place];  Service: Open Heart Surgery;  Laterality: N/A;  ? STERNAL CLOSURE N/A 10/30/2019  ? Procedure: Sternal Closure;  Surgeon: [name], MD;  Location: [place];  Service: Open Heart Surgery;  Laterality: N/A;  ? STERNAL WIRES REMOVAL N/A 05/14/2020  ? Procedure: STERNAL WIRES REMOVAL;  Surgeon: [name], MD;  Location: [place];  Service: Thoracic;  Laterality: N/A;  ? TEE WITHOUT CARDIOVERSION N/A 10/25/2019  ? Procedure: TRANSESOPHAGEAL ECHOCARDIOGRAM (TEE);  Surgeon: Prescott Gum, Collier Salina, MD;  Location: [place];  Service: Open Heart Surgery;  Laterality: N/A;  ? TEE WITHOUT CARDIOVERSION N/A 10/30/2019  ? Procedure: TRANSESOPHAGEAL ECHOCARDIOGRAM (TEE);  Surgeon: Prescott Gum, Collier Salina, MD;  Location: [place];  Service: Open Heart Surgery;  Laterality: N/A;  ? TEE WITHOUT CARDIOVERSION N/A 11/03/2019  ? Procedure: TRANSESOPHAGEAL ECHOCARDIOGRAM (TEE);  Surgeon: Prescott Gum, Collier Salina, MD;  Location: [place];  Service: Open Heart Surgery;  Laterality: N/A;  ? ?Past Surgical History:  ?Procedure Laterality Date  ? APPLICATION OF WOUND VAC  10/25/2019  ? Procedure: Application Of Wound Vac;  Surgeon: Prescott Gum, Collier Salina, MD;  Location: [place];  Service: Open Heart Surgery;;  ? CANNULATION FOR ECMO (EXTRACORPOREAL MEMBRANE OXYGENATION)  10/25/2019  ? Procedure: Cannulation For Ecmo (Extracorporeal Membrane Oxygenation) @ 1610;  Surgeon: Prescott Gum, Collier Salina, MD;  Location: [place];  Service: Open Heart Surgery;;  ? CANNULATION FOR ECMO (EXTRACORPOREAL MEMBRANE OXYGENATION) N/A 10/30/2019  ? Procedure: DECANNULATION OF ECMO (EXTRACORPOREAL MEMBRANE OXYGENATION);  Surgeon: Prescott Gum,  Collier Salina, MD;  Location: [place];  Service: Open Heart Surgery;  Laterality: N/A;  Pump standby  ? CHEST EXPLORATION  10/25/2019  ? Procedure: Chest Exploration for repair of coronary perforation (repair of LAD);  Surgeon: Prescott Gum, Collier Salina, MD;  Location: [place];  Service: Open Heart Surgery;;  ? CORONARY CTO INTERVENTION N/A 10/25/2019  ? Procedure: CORONARY CTO INTERVENTION;  Surgeon: Martinique, Peter M, MD;  Location: [place] [facility];  Service: Cardiovascular;  Laterality: N/A;  ? CORONARY STENT INTERVENTION N/A 10/17/2019  ? Procedure: CORONARY STENT INTERVENTION;  Surgeon: Sherren Mocha, MD;  Location: [place] [facility];  Service: Cardiovascular;  Laterality: N/A;  ? LEFT HEART CATH AND CORONARY ANGIOGRAPHY N/A 10/17/2019  ? Procedure: LEFT HEART CATH AND CORONARY ANGIOGRAPHY;  Surgeon: Sherren Mocha, MD;  Location: [place] [facility];  Service: Cardiovascular;  Laterality: N/A;  ? MEDIASTERNOTOMY  10/25/2019  ? Procedure: Median Sternotomy.;  Surgeon: [name], MD;  Location[facility];  Service: Open Heart Surgery;;  ? PLACEMENT OF IMPELLA LEFT VENTRICULAR ASSIST DEVICE  10/30/2019  ? Procedure: Placement Of Impella Left Ventricular Assist Device 5.5 USING 10MM HEMASHIELD PLATINUM GRAFT;  Surgeon: [name], MD;  Location: [place];  Service: Open Heart Surgery;;  graft placed into aorta  ? REMOVAL OF IMPELLA LEFT VENTRICULAR ASSIST DEVICE N/A 11/03/2019  ? Procedure: REMOVAL OF IMPELLA 5.5 LEFT VENTRICULAR ASSIST DEVICE;  Surgeon: [name], MD;  Location: [place];  Service: Open Heart Surgery;  Laterality: N/A;  ? STERNAL CLOSURE N/A 10/30/2019  ? Procedure: Sternal Closure;  Surgeon: [name], MD;  Location: [place];  Service: Open Heart Surgery;  Laterality: N/A;  ? STERNAL WIRES REMOVAL N/A 05/14/2020  ? Procedure: STERNAL WIRES REMOVAL;  Surgeon: [name], MD;  Location: [place];  Service: Thoracic;  Laterality: N/A;  ? TEE WITHOUT CARDIOVERSION N/A 10/25/2019  ? Procedure: TRANSESOPHAGEAL  ECHOCARDIOGRAM (TEE);  Surgeon: Prescott Gum, Collier Salina, MD;  Location: [place];  Service: Open Heart Surgery;  Laterality: N/A;  ? TEE WITHOUT CARDIOVERSION N/A 10/30/2019  ? Procedure: TRANSESOPHAGEAL ECHOCARDIOGRAM (TEE

## 2021-09-20 IMAGING — DX DG CHEST 2V
2 series · 2 of 2 positions shown · non-contrast
Comparison: 11/11/2019

CLINICAL DATA: History of left ventricular assist device.

EXAM:
CHEST - 2 VIEW

[dg chest 2 view (1 of 2)]
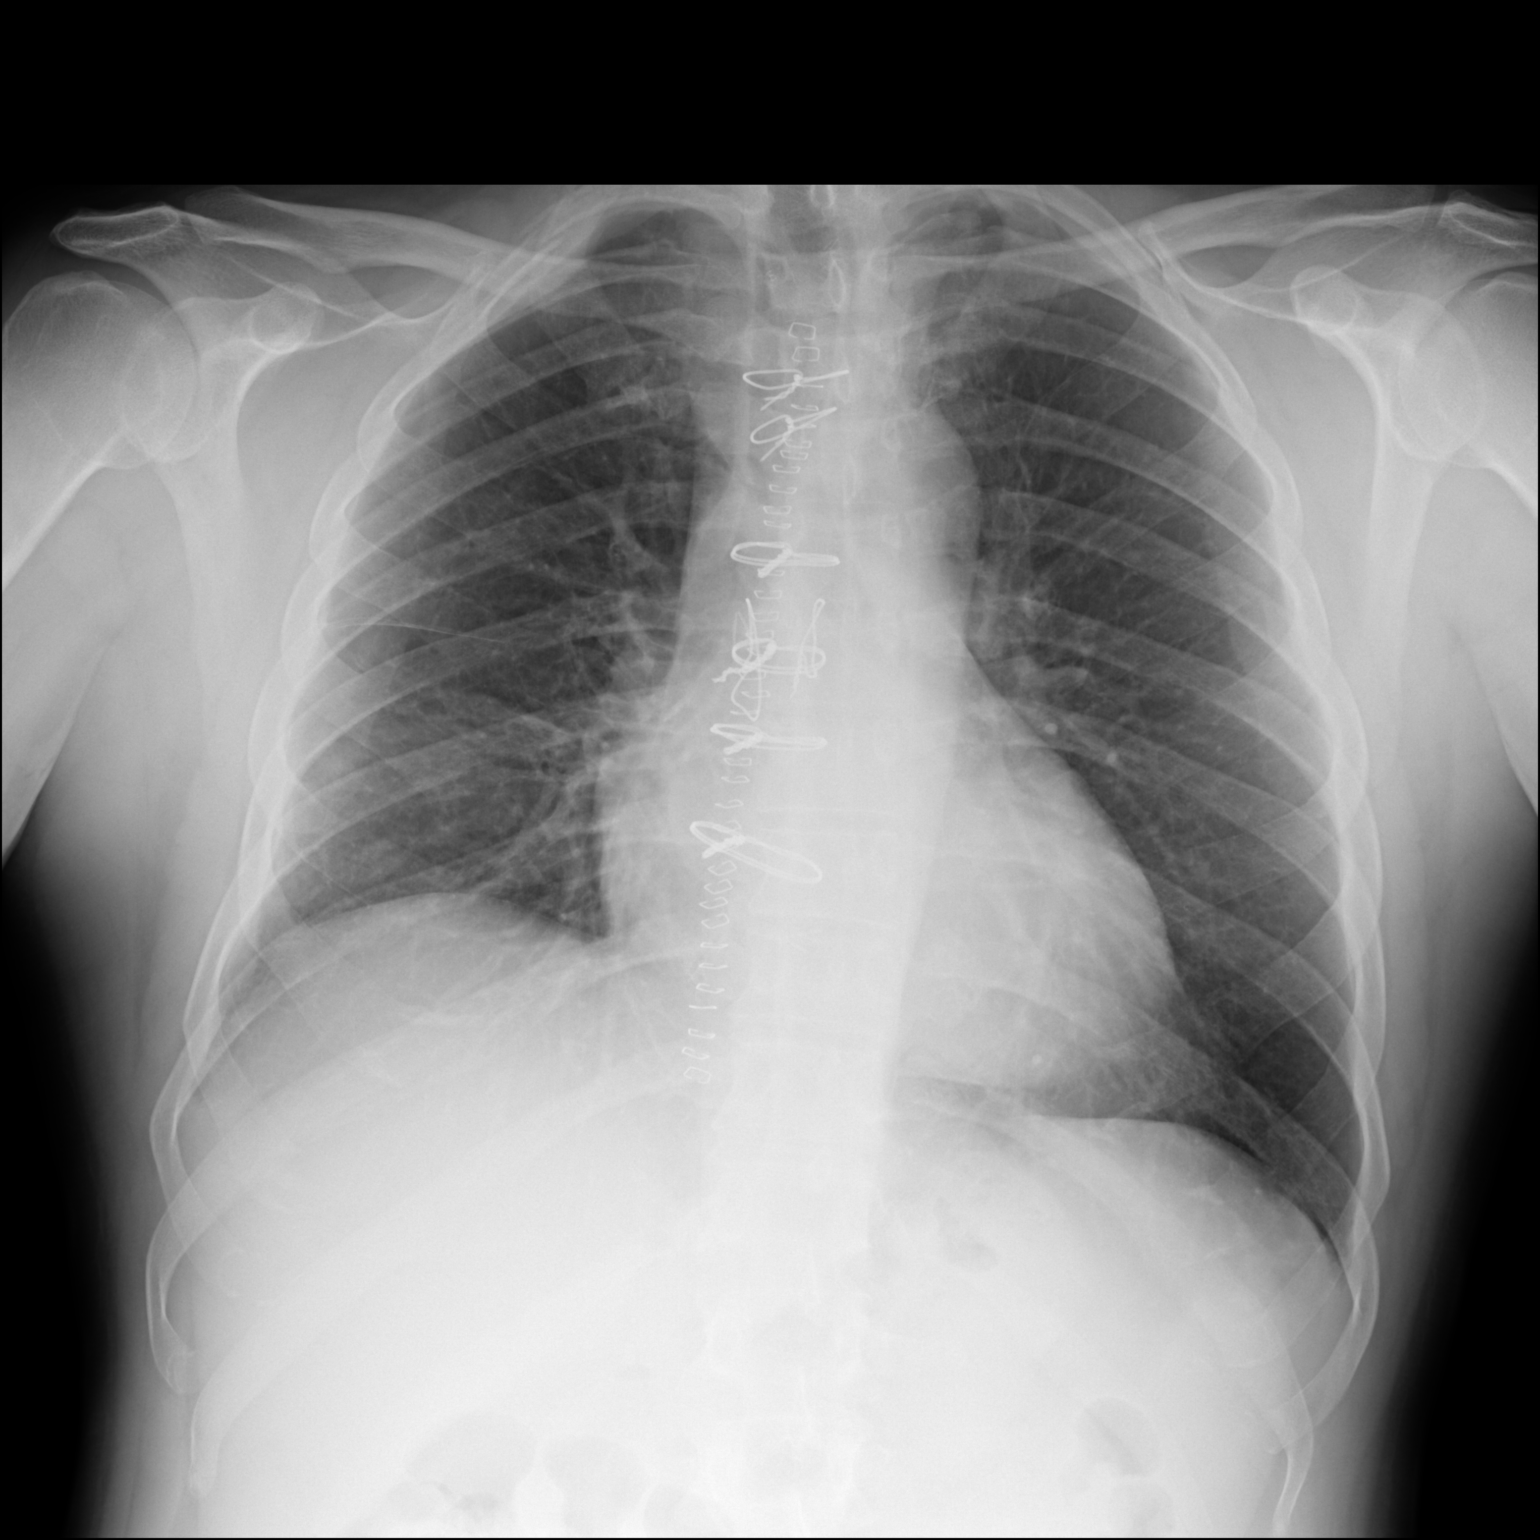

[dg chest 2 view (2 of 2)]
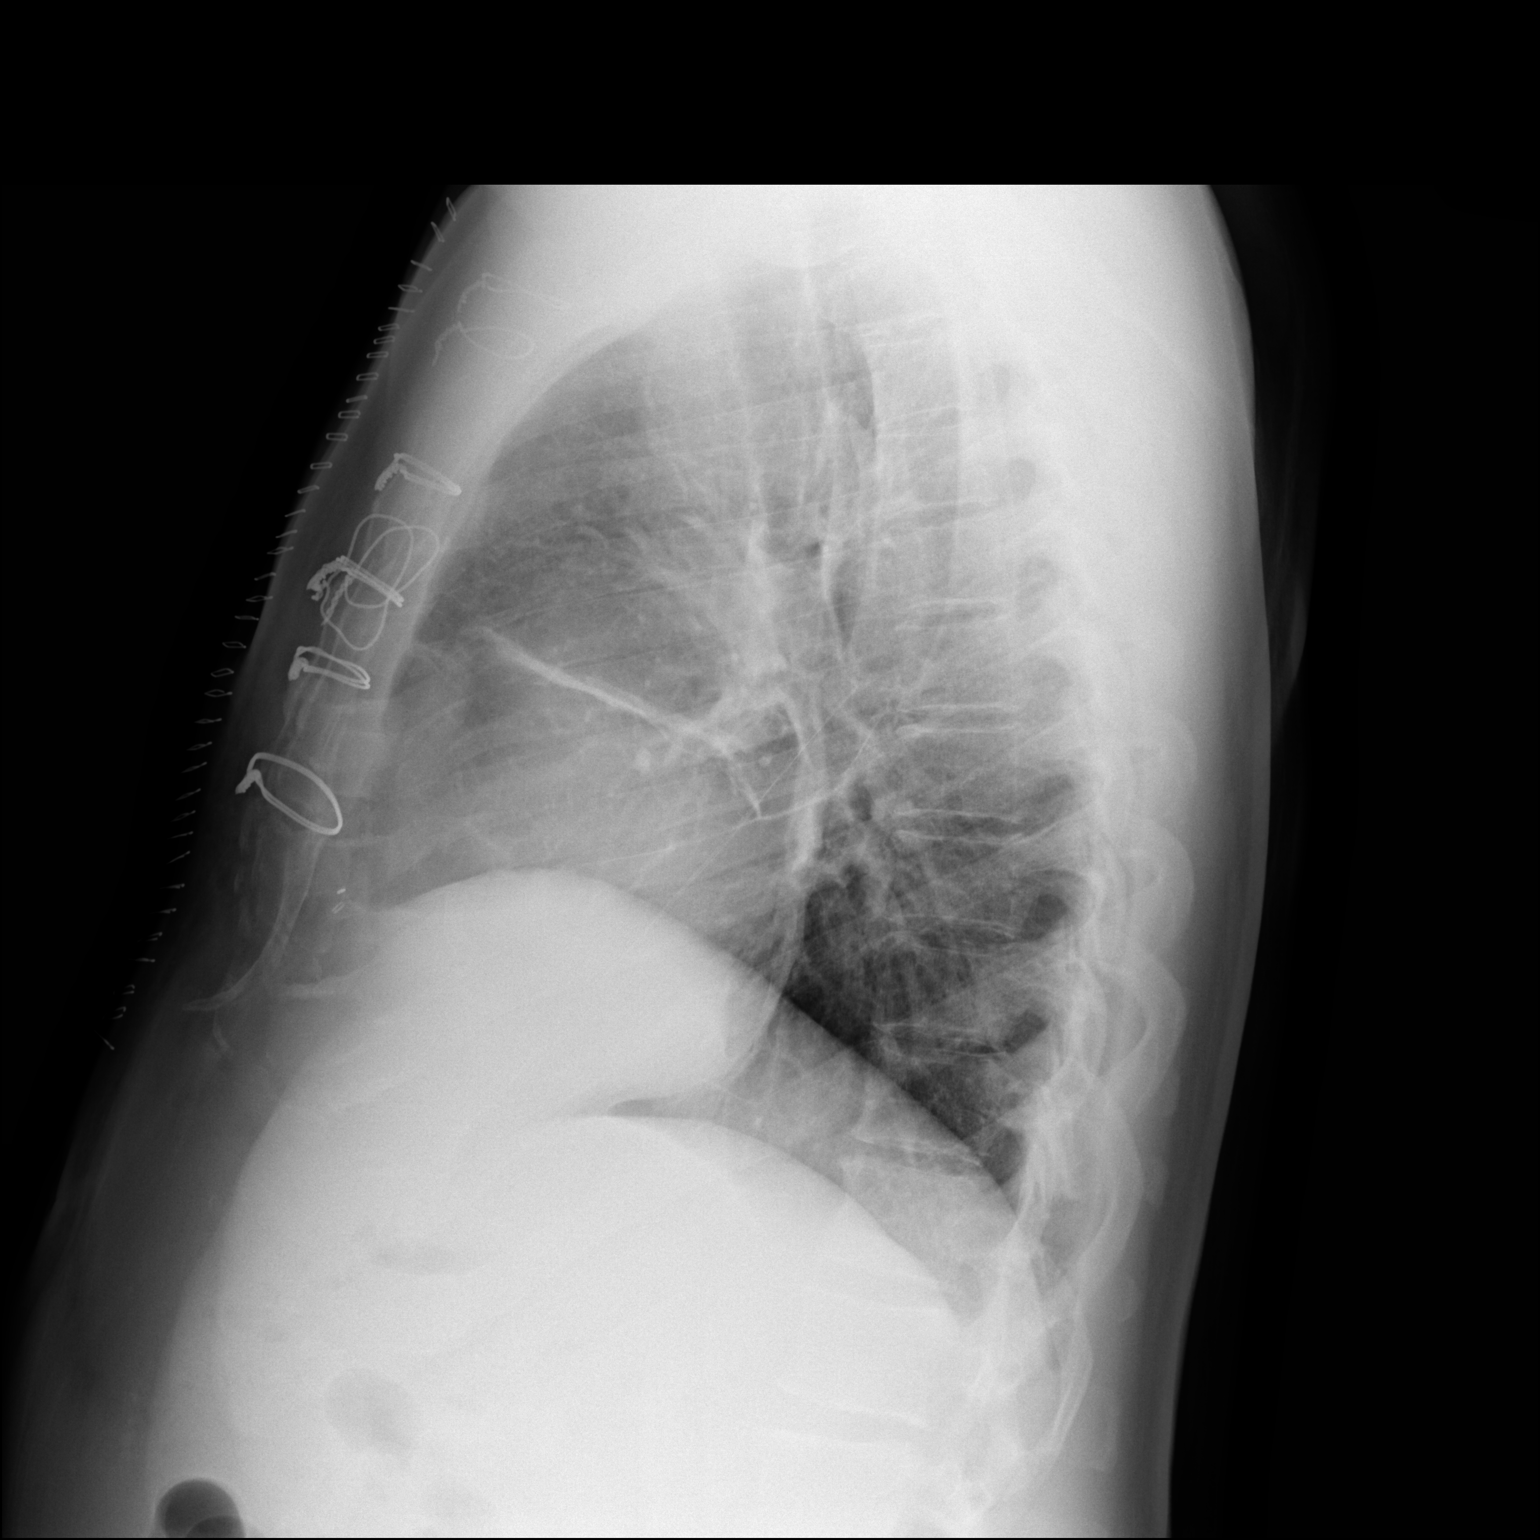

[2 of 2 positions shown; findings below may reference images not displayed]

FINDINGS: The lungs are clear without focal pneumonia, edema, pneumothorax or
pleural effusion. The cardiopericardial silhouette is within normal
limits for size. Status post median sternotomy. Streaky right
parahilar atelectasis has decreased in the interval.
IMPRESSION: Interval decrease in streaky right parahilar atelectasis. Otherwise
no acute cardiopulmonary findings.

## 2021-09-22 ENCOUNTER — Other Ambulatory Visit (HOSPITAL_COMMUNITY): Payer: Self-pay

## 2021-09-22 ENCOUNTER — Telehealth (HOSPITAL_COMMUNITY): Payer: Self-pay | Admitting: Pharmacy Technician

## 2021-09-22 NOTE — Telephone Encounter (Signed)
Patient Advocate Encounter ?  ?Received notification from [employer] that prior authorization for Vascepa [facility]) is required. ?  ?PA submitted on CoverMyMeds ?Key  [id number] ?Status is pending ?  ?Will continue to follow. ? ?

## 2021-09-23 ENCOUNTER — Other Ambulatory Visit (HOSPITAL_COMMUNITY): Payer: Self-pay

## 2021-09-23 NOTE — Telephone Encounter (Signed)
Advanced Heart Failure Patient Advocate Encounter ? ?Prior Authorization for Vascepa [employer]) has been approved.   ? ?PA# [id number] ?Effective dates: 09/22/21 through 09/23/22 ? ?Patients co-pay is $350.54 (30 days) The vascepa co-pay card will only take off $150 monthly and $450 for 2 months (90 day copay $842, after co-pay card would be $392). Will call and follow up with patient regarding affordability. ? ? ? ?

## 2021-10-01 ENCOUNTER — Other Ambulatory Visit (HOSPITAL_COMMUNITY): Payer: Self-pay

## 2021-10-02 NOTE — Telephone Encounter (Signed)
Attempted PA for generic Vascepa in efforts to ensure patient affordability. PA denied, medication is not on the formulary. Will call and update patient. ?

## 2021-10-07 NOTE — Telephone Encounter (Signed)
Called and spoke with patient who cannot afford the brand Vascepa co-pay. Reached out to provider to advise. Will follow up with patient.  ?

## 2021-10-08 ENCOUNTER — Other Ambulatory Visit (HOSPITAL_COMMUNITY): Payer: Self-pay | Admitting: *Deleted

## 2021-10-08 ENCOUNTER — Other Ambulatory Visit (HOSPITAL_COMMUNITY): Payer: Self-pay

## 2021-10-08 MED ORDER — FENOFIBRATE 48 MG PO TABS: 48.0000 mg | ORAL_TABLET | Freq: Every day | ORAL | 3 refills | Status: DC

## 2021-10-08 NOTE — Telephone Encounter (Signed)
William Dresser, MD  Charlann Boxer, CPhT ? ?Replace with fenofibrate 48 mg daily, Lipids 2 months.   ? ?30 day co-pay $2.95, 90 day co-pay, $8.85 ? ?Called and spoke with the patient. Sent 90 day RX request to [place] ([place] to send to [facility].  ? ?Charlann Boxer, CPhT ? ? ?

## 2021-10-21 ENCOUNTER — Encounter: Payer: 59 | Attending: Physical Medicine and Rehabilitation | Admitting: Registered Nurse

## 2021-10-21 ENCOUNTER — Encounter: Payer: Self-pay | Admitting: Registered Nurse

## 2021-10-21 VITALS — BP 146/91 | HR 75 | Ht 68.0 in | Wt 178.0 lb

## 2021-10-21 DIAGNOSIS — G894 Chronic pain syndrome: Secondary | ICD-10-CM | POA: Insufficient documentation

## 2021-10-21 DIAGNOSIS — Z5181 Encounter for therapeutic drug level monitoring: Secondary | ICD-10-CM | POA: Insufficient documentation

## 2021-10-21 DIAGNOSIS — Z79899 Other long term (current) drug therapy: Secondary | ICD-10-CM | POA: Diagnosis present

## 2021-10-21 DIAGNOSIS — E349 Endocrine disorder, unspecified: Secondary | ICD-10-CM | POA: Diagnosis present

## 2021-10-21 DIAGNOSIS — G8929 Other chronic pain: Secondary | ICD-10-CM | POA: Insufficient documentation

## 2021-10-21 DIAGNOSIS — M545 Low back pain, unspecified: Secondary | ICD-10-CM | POA: Diagnosis present

## 2021-10-21 DIAGNOSIS — M170 Bilateral primary osteoarthritis of knee: Secondary | ICD-10-CM | POA: Insufficient documentation

## 2021-10-21 MED ORDER — OXYCODONE-ACETAMINOPHEN 10-325 MG PO TABS: 1.0000 | ORAL_TABLET | ORAL | 0 refills | Status: DC | PRN

## 2021-10-21 NOTE — Progress Notes (Signed)
Subjective:    Patient ID: William Crawford, male    DOB: 1970-11-22, 51 y.o.   MRN: [id number]  HPI: William Crawford is a 51 y.o. male who returns for follow up appointment for chronic pain and medication refill. He states his pain is located in his lower back and bilateral knees. He rates his pain 6. His current exercise regime is walking and performing stretching exercises.  William Crawford Morphine equivalent is 90.00 MME.   Last UDS was Performed on 06/26/2021, it was consistent.     Pain Inventory Average Pain 8 Pain Right Now 6 My pain is sharp, burning, stabbing, and aching  In the last 24 hours, has pain interfered with the following? General activity 7 Relation with others 5 Enjoyment of life 5 What TIME of day is your pain at its worst? morning , daytime, and evening Sleep (in general) Fair  Pain is worse with: bending and some activites Pain improves with: rest and medication Relief from Meds: 8  Family History  Problem Relation Age of Onset   Heart attack Father    Heart disease Father    Heart attack Paternal Grandfather    Heart disease Paternal Grandfather    Heart attack Paternal Uncle    Heart disease Paternal Uncle    Heart attack Paternal Uncle    Heart disease Paternal Uncle    Social History   Socioeconomic History   Marital status: Single    Spouse name: Not on file   Number of children: Not on file   Years of education: Not on file   Highest education level: Not on file  Occupational History   Not on file  Tobacco Use   Smoking status: Former    Types: Cigarettes    Quit date: 10/23/2019    Years since quitting: 1.9   Smokeless tobacco: Never  Vaping Use   Vaping Use: Never used  Substance and Sexual Activity   Alcohol use: Not Currently    Comment: QUIT  IN MAY 17616   Drug use: No   Sexual activity: Not on file  Other Topics Concern   Not on file  Social History Narrative   Not on file   Social Determinants of Health    Financial Resource Strain: Not on file  Food Insecurity: Not on file  Transportation Needs: Not on file  Physical Activity: Not on file  Stress: Not on file  Social Connections: Not on file   Past Surgical History:  Procedure Laterality Date   APPLICATION OF WOUND VAC  10/25/2019   Procedure: Application Of Wound Vac;  Surgeon: [name], MD;  Location: [place];  Service: Open Heart Surgery;;   CANNULATION FOR ECMO (EXTRACORPOREAL MEMBRANE OXYGENATION)  10/25/2019   Procedure: Cannulation For Ecmo (Extracorporeal Membrane Oxygenation) @ 1531;  Surgeon: Prescott Gum, Collier Salina, MD;  Location: [place];  Service: Open Heart Surgery;;   CANNULATION FOR ECMO (EXTRACORPOREAL MEMBRANE OXYGENATION) N/A 10/30/2019   Procedure: DECANNULATION OF ECMO (EXTRACORPOREAL MEMBRANE OXYGENATION);  Surgeon: Prescott Gum, Collier Salina, MD;  Location: [place];  Service: Open Heart Surgery;  Laterality: N/A;  Pump standby   CHEST EXPLORATION  10/25/2019   Procedure: Chest Exploration for repair of coronary perforation (repair of LAD);  Surgeon: Prescott Gum, Collier Salina, MD;  Location: [place];  Service: Open Heart Surgery;;   CORONARY CTO INTERVENTION N/A 10/25/2019   Procedure: CORONARY CTO INTERVENTION;  Surgeon: Martinique, Peter M, MD;  Location: [place] [facility];  Service: Cardiovascular;  Laterality: N/A;   CORONARY STENT INTERVENTION N/A 10/17/2019   Procedure: CORONARY STENT INTERVENTION;  Surgeon: Sherren Mocha, MD;  Location: [place] [facility];  Service: Cardiovascular;  Laterality: N/A;   LEFT HEART CATH AND CORONARY ANGIOGRAPHY N/A 10/17/2019   Procedure: LEFT HEART CATH AND CORONARY ANGIOGRAPHY;  Surgeon: Sherren Mocha, MD;  Location: [place] [facility];  Service: Cardiovascular;  Laterality: N/A;   [place]  10/25/2019   Procedure: Median Sternotomy.;  Surgeon: [name], MD;  Location: [facility];  Service: Open Heart Surgery;;   PLACEMENT OF IMPELLA LEFT VENTRICULAR ASSIST DEVICE  10/30/2019   Procedure: Placement Of  Impella Left Ventricular Assist Device 5.5 USING 10MM HEMASHIELD PLATINUM GRAFT;  Surgeon: [name], MD;  Location: [place];  Service: Open Heart Surgery;;  graft placed into aorta   REMOVAL OF IMPELLA LEFT VENTRICULAR ASSIST DEVICE N/A 11/03/2019   Procedure: REMOVAL OF IMPELLA 5.5 LEFT VENTRICULAR ASSIST DEVICE;  Surgeon: [name], MD;  Location: [place];  Service: Open Heart Surgery;  Laterality: N/A;   STERNAL CLOSURE N/A 10/30/2019   Procedure: Sternal Closure;  Surgeon: [name], MD;  Location: [place];  Service: Open Heart Surgery;  Laterality: N/A;   STERNAL WIRES REMOVAL N/A 05/14/2020   Procedure: STERNAL WIRES REMOVAL;  Surgeon: [name], MD;  Location: [place];  Service: Thoracic;  Laterality: N/A;   TEE WITHOUT CARDIOVERSION N/A 10/25/2019   Procedure: TRANSESOPHAGEAL ECHOCARDIOGRAM (TEE);  Surgeon: Prescott Gum, Collier Salina, MD;  Location: [place];  Service: Open Heart Surgery;  Laterality: N/A;   TEE WITHOUT CARDIOVERSION N/A 10/30/2019   Procedure: TRANSESOPHAGEAL ECHOCARDIOGRAM (TEE);  Surgeon: Prescott Gum, Collier Salina, MD;  Location: [place];  Service: Open Heart Surgery;  Laterality: N/A;   TEE WITHOUT CARDIOVERSION N/A 11/03/2019   Procedure: TRANSESOPHAGEAL ECHOCARDIOGRAM (TEE);  Surgeon: Prescott Gum, Collier Salina, MD;  Location: [place];  Service: Open Heart Surgery;  Laterality: N/A;   Past Surgical History:  Procedure Laterality Date   APPLICATION OF WOUND VAC  10/25/2019   Procedure: Application Of Wound Vac;  Surgeon: [name], MD;  Location: [place];  Service: Open Heart Surgery;;   CANNULATION FOR ECMO (EXTRACORPOREAL MEMBRANE OXYGENATION)  10/25/2019   Procedure: Cannulation For Ecmo (Extracorporeal Membrane Oxygenation) @ 8338;  Surgeon: [name], MD;  Location: [place];  Service: Open Heart Surgery;;   CANNULATION FOR ECMO (EXTRACORPOREAL MEMBRANE OXYGENATION) N/A 10/30/2019   Procedure: DECANNULATION OF ECMO (EXTRACORPOREAL MEMBRANE OXYGENATION);  Surgeon: Prescott Gum, Collier Salina,  MD;  Location: [place];  Service: Open Heart Surgery;  Laterality: N/A;  Pump standby   CHEST EXPLORATION  10/25/2019   Procedure: Chest Exploration for repair of coronary perforation (repair of LAD);  Surgeon: Prescott Gum, Collier Salina, MD;  Location: [place];  Service: Open Heart Surgery;;   CORONARY CTO INTERVENTION N/A 10/25/2019   Procedure: CORONARY CTO INTERVENTION;  Surgeon: Martinique, Peter M, MD;  Location: [place] [facility];  Service: Cardiovascular;  Laterality: N/A;   CORONARY STENT INTERVENTION N/A 10/17/2019   Procedure: CORONARY STENT INTERVENTION;  Surgeon: Sherren Mocha, MD;  Location: [place] [facility];  Service: Cardiovascular;  Laterality: N/A;   LEFT HEART CATH AND CORONARY ANGIOGRAPHY N/A 10/17/2019   Procedure: LEFT HEART CATH AND CORONARY ANGIOGRAPHY;  Surgeon: Sherren Mocha, MD;  Location: [place] [facility];  Service: Cardiovascular;  Laterality: N/A;   MEDIASTERNOTOMY  10/25/2019   Procedure: Median Sternotomy.;  Surgeon: [name], MD;  Location: [place];  Service: Open Heart Surgery;;   PLACEMENT OF IMPELLA LEFT VENTRICULAR ASSIST DEVICE  10/30/2019   Procedure: Placement Of Impella Left Ventricular Assist Device 5.5 USING 10MM HEMASHIELD PLATINUM GRAFT;  Surgeon: [name], MD;  Location: [place];  Service: Open Heart Surgery;;  graft placed into aorta   REMOVAL OF IMPELLA LEFT VENTRICULAR ASSIST DEVICE N/A 11/03/2019   Procedure: REMOVAL OF IMPELLA 5.5 LEFT VENTRICULAR ASSIST DEVICE;  Surgeon: [name], MD;  Location: [place];  Service: Open Heart Surgery;  Laterality: N/A;   STERNAL CLOSURE N/A 10/30/2019   Procedure: Sternal Closure;  Surgeon: [name], MD;  Location: [place];  Service: Open Heart Surgery;  Laterality: N/A;   STERNAL WIRES REMOVAL N/A 05/14/2020   Procedure: STERNAL WIRES REMOVAL;  Surgeon: [name], MD;  Location: [place];  Service: Thoracic;  Laterality: N/A;   TEE WITHOUT CARDIOVERSION N/A 10/25/2019   Procedure: TRANSESOPHAGEAL  ECHOCARDIOGRAM (TEE);  Surgeon: Prescott Gum, Collier Salina, MD;  Location: [place];  Service: Open Heart Surgery;  Laterality: N/A;   TEE WITHOUT CARDIOVERSION N/A 10/30/2019   Procedure: TRANSESOPHAGEAL ECHOCARDIOGRAM (TEE);  Surgeon: Prescott Gum, Collier Salina, MD;  Location: [place];  Service: Open Heart Surgery;  Laterality: N/A;   TEE WITHOUT CARDIOVERSION N/A 11/03/2019   Procedure: TRANSESOPHAGEAL ECHOCARDIOGRAM (TEE);  Surgeon: Prescott Gum, Collier Salina, MD;  Location: [place];  Service: Open Heart Surgery;  Laterality: N/A;   Past Medical History:  Diagnosis Date   Alcohol abuse    Anxiety    CAD (coronary artery disease)    Cardiac arrest ([place])    Cardiogenic shock (HCC)    CHF (congestive heart failure) (HCC)    Cough    Dysrhythmia    GERD (gastroesophageal reflux disease)    HFrEF (heart failure with reduced ejection fraction) (HCC)    Hypertension    Hypertriglyceridemia    Ischemic cardiomyopathy    Right atrial thrombus    Tobacco abuse    BP (!) 143/82   Pulse 73   Ht 5' 8"  (1.727 m)   Wt 178 lb (80.7 kg)   SpO2 100%   BMI 27.06 kg/m   Opioid Risk Score:   Fall Risk Score:  `1  Depression screen [facility] 2/9     10/21/2021    3:23 PM 09/19/2021    2:28 PM 08/19/2021    9:49 AM 06/26/2021    3:35 PM 05/02/2021    3:52 PM 03/28/2021    3:48 PM 03/10/2021    1:52 PM  Depression screen PHQ 2/9  Decreased Interest 0 0 0 0 0 0 0  Down, Depressed, Hopeless 0 0 0 0 0 0 0  PHQ - 2 Score 0 0 0 0 0 0 0     Review of Systems  Constitutional: Negative.   HENT: Negative.    Eyes: Negative.   Respiratory: Negative.    Cardiovascular: Negative.   Gastrointestinal: Negative.   Endocrine: Negative.   Genitourinary: Negative.   Musculoskeletal:  Positive for back pain.  Skin: Negative.   Allergic/Immunologic: Negative.   Neurological: Negative.   Hematological: Negative.   Psychiatric/Behavioral: Negative.        Objective:   Physical Exam Vitals and nursing note reviewed.  Constitutional:       Appearance: Normal appearance.  Cardiovascular:     Rate and Rhythm: Normal rate and regular rhythm.     Pulses: Normal pulses.     Heart sounds: Normal heart sounds.  Pulmonary:  Effort: Pulmonary effort is normal.     Breath sounds: Normal breath sounds.  Musculoskeletal:     Cervical back: Normal range of motion and neck supple.     Comments: Normal Muscle Bulk and Muscle Testing Reveals:  Upper Extremities: Full ROM and Muscle Strength 5/5 Lumbar Paraspinal Tenderness: L-4-L-5 Lower Extremities: Full ROM and Muscle Strength 5/5 Arises from Chair with ease Narrow Based  Gait     Skin:    General: Skin is warm and dry.  Neurological:     Mental Status: He is alert and oriented to person, place, and time.  Psychiatric:        Mood and Affect: Mood normal.        Behavior: Behavior normal.         Assessment & Plan:  1. Left Shoulder Pain: No complaints today. Continue HEP as Tolerated. Continue current medication regimen. Continue to monitor. 10/21/2021 2.Left Greater Trochanter Bursitis: No complaints today. S/P Left Hip Injection with Dr Ranell Patrick on 09/03/2020, no relief noted he reports, Continue to alternate with Heat an Ice Therapy. Continue to monitor. 10/21/2021 3. Bilateral  Knees OA: Continue HEP as Tolerated. Continue current medication regimen. 10/21/2021 3. Chest Wall Discomfort/ Neuropathic Pain: No complaints today. Continue Gabapentin. Continue to Monitor. 10/21/2021 4.Chronic Low Back Pain without sciatica: Continue HEP and Continue current medication regimen. Continue to monitor. 10/21/2021 5. Chronic Pain Syndrome: : Refilled: Oxycodone 10/325 mg one tablet every 4 hours  as needed for pain #180. We will continue the opioid monitoring program, this consists of regular clinic visits, examinations, urine drug screen, pill counts as well as use of [place] Controlled Substance Reporting system. A 12 month History has been reviewed on the [place]  Controlled Substance Reporting System on 10/21/2021.   F/U in 1 month

## 2021-10-23 ENCOUNTER — Other Ambulatory Visit (HOSPITAL_COMMUNITY): Payer: Self-pay

## 2021-10-23 MED ORDER — CLOPIDOGREL BISULFATE 75 MG PO TABS: ORAL_TABLET | ORAL | 3 refills | Status: DC

## 2021-11-05 ENCOUNTER — Encounter: Payer: 59 | Attending: Physical Medicine and Rehabilitation | Admitting: Physical Medicine and Rehabilitation

## 2021-11-05 DIAGNOSIS — Z79891 Long term (current) use of opiate analgesic: Secondary | ICD-10-CM | POA: Insufficient documentation

## 2021-11-05 DIAGNOSIS — R0789 Other chest pain: Secondary | ICD-10-CM | POA: Insufficient documentation

## 2021-11-05 DIAGNOSIS — M48062 Spinal stenosis, lumbar region with neurogenic claudication: Secondary | ICD-10-CM | POA: Insufficient documentation

## 2021-11-05 DIAGNOSIS — G8929 Other chronic pain: Secondary | ICD-10-CM | POA: Insufficient documentation

## 2021-11-05 DIAGNOSIS — M5441 Lumbago with sciatica, right side: Secondary | ICD-10-CM | POA: Insufficient documentation

## 2021-11-05 DIAGNOSIS — Z5181 Encounter for therapeutic drug level monitoring: Secondary | ICD-10-CM | POA: Insufficient documentation

## 2021-11-05 DIAGNOSIS — M5442 Lumbago with sciatica, left side: Secondary | ICD-10-CM | POA: Insufficient documentation

## 2021-11-05 DIAGNOSIS — G894 Chronic pain syndrome: Secondary | ICD-10-CM | POA: Insufficient documentation

## 2021-11-06 NOTE — Progress Notes (Signed)
Subjective:    Patient ID: William Crawford, male    DOB: [birth year], 51 y.o.   MRN: [id number]  HPI: An audio/video tele-health visit is felt to be the most appropriate encounter for this patient at this time. This is a follow up tele-visit via phone. The patient is at home. MD is at office. Prior to scheduling this appointment, our staff discussed the limitations of evaluation and management by telemedicine and the availability of in-person appointments. The patient expressed understanding and agreed to proceed.    William Crawford is a 51 y.o. male who presents for follow-up of chronic low back pain, bilateral knee pain, chest pain, trigger finger, testosterone deficiency, and neck pain.   -currently pain is worse in both knees and his low back. He cannot say whether the testosterone supplement helps.  -back pain has been worsening -he would like to pursue MRI -he asks if he can use hemp seed oil -he discussed with his cardiologist decreasing his atorvastatin dose and it has been decreased to 34m. He has not had any improvements in pain since this change, if fact pain has worsened -he had a busy day yesterday working on his parents' [employer] [place] sure it was safe for them to drive their car. He feels that this may have contributed to his worsening pain -been ok -he would like to know his vitamin D and testosterone level results and discuss treatment for his trigger finger -qutenza has helped minimize his chest pain, it is now gone. -had a death in the family- uncle had a pneumonia. [place] He died before he could leave work. Her aunt is taking this pretty hard. He was really close to him. They used to talk 2-3 times per week.  -The knee pain is located just under the knee cap and extends medially to laterally -He is feeling much better this week and has been able to work, but still having significant knee and low  back pain -He was unable to work today due to pain in both back and knees -Also has chest pain at times- sharp and lasts three minutes when he has it.  -Pain has grown excruciating but he has been able to return to work as he needs to financially. -Discussed results of his bilateral knee MRI, his neck XR, and his back XR -He continues to have chest pain and had recent follow-up with cardiology. Would still like to try the Qutenza patch -he has never tried Cymbalta and would be willing to -he requests if oxycodone can be increased to 6 tablets per day.  -does not note radiation of pain into legs, but legs have given out dur to pain causing near fall -he does not note loss of bowel or bladder function -does note constipation -also having shooting pain down both arms into pinky fingers -discussed that bilateral knee pain and back pain could potentially be related to opioid-induced testosterone deficiency and he is agreeable to checking testosterone levels -he is also interested in ortho follow-up for his knee pain -he is feeling better today and was able to go to work -had burning pain over the weekend from the [place] now [place]. Forgot to use the cooling gel. -he has never had PSA checked, discussed that result is 0.4 which is low, so we could trial testosterone patch given his deficiency and symptoms if he would like. He is agreeable and  I have sent to his pharmacy.   Prior history: He states his pain is located in his left shoulder, lower back pain, left hip and bilateral knee pain. He was in contact with Dr Ranell Patrick regarding his increase intensity of pain, per Dr Ranell Patrick note his oxycodone will be increased to 5 times a day as needed for pain. William Crawford understanding.  He rates his pain 4. His current exercise regime is walking and performing stretching exercises.  Mr. Fauth Morphine equivalent is 60.00  MME.   Last UDs was Performed on 08/28/2020, it was consistent.    He is having a good summer.   He fell down and hit his right ankle and got a big bruise- not hurting too bad.   He had a good time with his grandson riding rolling coasters at [employer]  His chest pain returned and it feels sore and he plans to f/u with his surgeon regarding the pain. It feels sharp, hot, like a burning He takes Gabapentin 648m- but that doesn't help unless used with the oxycodone.  Currently his pain is worse in bilateral knees- it is worst when he kneels, climbs ladders. He has a supportive community at work which allow him to limit his kneeling and he has been using pads when he kneels. His oxycodone is now only lasting him about 2.5 hours after he takes it and he has been taking them 5 times per day. He asks if there is anything stronger he can take. He has tried Voltaren gel in the past and its effect usually started an hour after application.  -got compression sleeves to apply to his knees and has been able to work -at times his knee still buckles and this is painful -he feels he is getting used to the pain with walking but as far as climbing he feels it a lot more -he got a steroid injection by orthopedics and did find that it helped but did not last very long.  -he has been trying to eat more foods that reduce inflammation  Low testosterone:  -he bought an over the counter supplement since the patch was not covered by insurance -takes magnesium, B1, zinc, vitamin D.      Pain Inventory Average Pain 9 Pain Right Now 7 My pain is sharp, burning, stabbing, tingling, and aching  In the last 24 hours, has pain interfered with the following? General activity 7 Relation with others 3 Enjoyment of life 5 What TIME of day is your pain at its worst? morning , daytime, and evening Sleep (in general) Fair  Pain is worse with: bending and sitting Pain improves with: medication Relief from Meds: 8  Family History  Problem Relation Age of Onset   Heart attack  Father    Heart disease Father    Heart attack Paternal Grandfather    Heart disease Paternal Grandfather    Heart attack Paternal Uncle    Heart disease Paternal Uncle    Heart attack Paternal Uncle    Heart disease Paternal Uncle    Social History   Socioeconomic History   Marital status: Single    Spouse name: Not on file   Number of children: Not on file   Years of education: Not on file   Highest education level: Not on file  Occupational History   Not on file  Tobacco Use   Smoking status: Former    Types: Cigarettes    Quit date: 10/23/2019    Years since  quitting: 2.0   Smokeless tobacco: Never  Vaping Use   Vaping Use: Never used  Substance and Sexual Activity   Alcohol use: Not Currently    Comment: QUIT  IN MAY 22633   Drug use: No   Sexual activity: Not on file  Other Topics Concern   Not on file  Social History Narrative   Not on file   Social Determinants of Health   Financial Resource Strain: Not on file  Food Insecurity: Not on file  Transportation Needs: Not on file  Physical Activity: Not on file  Stress: Not on file  Social Connections: Not on file   Past Surgical History:  Procedure Laterality Date   APPLICATION OF WOUND VAC  10/25/2019   Procedure: Application Of Wound Vac;  Surgeon: [name], MD;  Location: [place];  Service: Open Heart Surgery;;   CANNULATION FOR ECMO (EXTRACORPOREAL MEMBRANE OXYGENATION)  10/25/2019   Procedure: Cannulation For Ecmo (Extracorporeal Membrane Oxygenation) @ 1531;  Surgeon: [name], MD;  Location: [place];  Service: Open Heart Surgery;;   CANNULATION FOR ECMO (EXTRACORPOREAL MEMBRANE OXYGENATION) N/A 10/30/2019   Procedure: DECANNULATION OF ECMO (EXTRACORPOREAL MEMBRANE OXYGENATION);  Surgeon: Prescott Gum, Collier Salina, MD;  Location: [place];  Service: Open Heart Surgery;  Laterality: N/A;  Pump standby   CHEST EXPLORATION  10/25/2019   Procedure: Chest Exploration for repair of coronary perforation (repair of  LAD);  Surgeon: Prescott Gum, Collier Salina, MD;  Location: [place];  Service: Open Heart Surgery;;   CORONARY CTO INTERVENTION N/A 10/25/2019   Procedure: CORONARY CTO INTERVENTION;  Surgeon: Martinique, Peter M, MD;  Location: [place] [facility];  Service: Cardiovascular;  Laterality: N/A;   CORONARY STENT INTERVENTION N/A 10/17/2019   Procedure: CORONARY STENT INTERVENTION;  Surgeon: Sherren Mocha, MD;  Location: [place] [facility];  Service: Cardiovascular;  Laterality: N/A;   LEFT HEART CATH AND CORONARY ANGIOGRAPHY N/A 10/17/2019   Procedure: LEFT HEART CATH AND CORONARY ANGIOGRAPHY;  Surgeon: Sherren Mocha, MD;  Location: [place] [facility];  Service: Cardiovascular;  Laterality: N/A;   [place]  10/25/2019   Procedure: Median Sternotomy.;  Surgeon: [name], MD;  [facility];  Service: Open Heart Surgery;;   PLACEMENT OF IMPELLA LEFT VENTRICULAR ASSIST DEVICE  10/30/2019   Procedure: Placement Of Impella Left Ventricular Assist Device 5.5 USING 10MM HEMASHIELD PLATINUM GRAFT;  Surgeon: [name], MD;  Location: [place];  Service: Open Heart Surgery;;  graft placed into aorta   REMOVAL OF IMPELLA LEFT VENTRICULAR ASSIST DEVICE N/A 11/03/2019   Procedure: REMOVAL OF IMPELLA 5.5 LEFT VENTRICULAR ASSIST DEVICE;  Surgeon: [name], MD;  Location: [place];  Service: Open Heart Surgery;  Laterality: N/A;   STERNAL CLOSURE N/A 10/30/2019   Procedure: Sternal Closure;  Surgeon: [name], MD;  Location: [place];  Service: Open Heart Surgery;  Laterality: N/A;   STERNAL WIRES REMOVAL N/A 05/14/2020   Procedure: STERNAL WIRES REMOVAL;  Surgeon: [name], MD;  Location: [place];  Service: Thoracic;  Laterality: N/A;   TEE WITHOUT CARDIOVERSION N/A 10/25/2019   Procedure: TRANSESOPHAGEAL ECHOCARDIOGRAM (TEE);  Surgeon: Prescott Gum, Collier Salina, MD;  Location: [place];  Service: Open Heart Surgery;  Laterality: N/A;   TEE WITHOUT CARDIOVERSION N/A 10/30/2019   Procedure: TRANSESOPHAGEAL  ECHOCARDIOGRAM (TEE);  Surgeon: Prescott Gum, Collier Salina, MD;  Location: [employer];  Service: Open Heart Surgery;  Laterality: N/A;   TEE WITHOUT CARDIOVERSION N/A 11/03/2019   Procedure: TRANSESOPHAGEAL  ECHOCARDIOGRAM (TEE);  Surgeon: Prescott Gum, Collier Salina, MD;  Location: [place];  Service: Open Heart Surgery;  Laterality: N/A;   Past Surgical History:  Procedure Laterality Date   APPLICATION OF WOUND VAC  10/25/2019   Procedure: Application Of Wound Vac;  Surgeon: [name], MD;  Location: [place];  Service: Open Heart Surgery;;   CANNULATION FOR ECMO (EXTRACORPOREAL MEMBRANE OXYGENATION)  10/25/2019   Procedure: Cannulation For Ecmo (Extracorporeal Membrane Oxygenation) @ 8144;  Surgeon: [name], MD;  Location: [place];  Service: Open Heart Surgery;;   CANNULATION FOR ECMO (EXTRACORPOREAL MEMBRANE OXYGENATION) N/A 10/30/2019   Procedure: DECANNULATION OF ECMO (EXTRACORPOREAL MEMBRANE OXYGENATION);  Surgeon: Prescott Gum, Collier Salina, MD;  Location: [place];  Service: Open Heart Surgery;  Laterality: N/A;  Pump standby   CHEST EXPLORATION  10/25/2019   Procedure: Chest Exploration for repair of coronary perforation (repair of LAD);  Surgeon: Prescott Gum, Collier Salina, MD;  Location: [place];  Service: Open Heart Surgery;;   CORONARY CTO INTERVENTION N/A 10/25/2019   Procedure: CORONARY CTO INTERVENTION;  Surgeon: Martinique, Peter M, MD;  Location: [place] [facility];  Service: Cardiovascular;  Laterality: N/A;   CORONARY STENT INTERVENTION N/A 10/17/2019   Procedure: CORONARY STENT INTERVENTION;  Surgeon: Sherren Mocha, MD;  Location: [place] [facility];  Service: Cardiovascular;  Laterality: N/A;   LEFT HEART CATH AND CORONARY ANGIOGRAPHY N/A 10/17/2019   Procedure: LEFT HEART CATH AND CORONARY ANGIOGRAPHY;  Surgeon: Sherren Mocha, MD;  Location: [place] [facility];  Service: Cardiovascular;  Laterality: N/A;   [place]  10/25/2019   Procedure: Median Sternotomy.;  Surgeon: [name], MD;  Location: [facility] OR;  Service: Open  Heart Surgery;;   PLACEMENT OF IMPELLA LEFT VENTRICULAR ASSIST DEVICE  10/30/2019   Procedure: Placement Of Impella Left Ventricular Assist Device 5.5 USING 10MM HEMASHIELD PLATINUM GRAFT;  Surgeon: [name], MD;  Location: [place];  Service: Open Heart Surgery;;  graft placed into aorta   REMOVAL OF IMPELLA LEFT VENTRICULAR ASSIST DEVICE N/A 11/03/2019   Procedure: REMOVAL OF IMPELLA 5.5 LEFT VENTRICULAR ASSIST DEVICE;  Surgeon: [name], MD;  Location: [place];  Service: Open Heart Surgery;  Laterality: N/A;   STERNAL CLOSURE N/A 10/30/2019   Procedure: Sternal Closure;  Surgeon: [name], MD;  Location: [place];  Service: Open Heart Surgery;  Laterality: N/A;   STERNAL WIRES REMOVAL N/A 05/14/2020   Procedure: STERNAL WIRES REMOVAL;  Surgeon: [name], MD;  Location: [place];  Service: Thoracic;  Laterality: N/A;   TEE WITHOUT CARDIOVERSION N/A 10/25/2019   Procedure: TRANSESOPHAGEAL ECHOCARDIOGRAM (TEE);  Surgeon: Prescott Gum, Collier Salina, MD;  Location: [place];  Service: Open Heart Surgery;  Laterality: N/A;   TEE WITHOUT CARDIOVERSION N/A 10/30/2019   Procedure: TRANSESOPHAGEAL ECHOCARDIOGRAM (TEE);  Surgeon: Prescott Gum, Collier Salina, MD;  Location: [place];  Service: Open Heart Surgery;  Laterality: N/A;   TEE WITHOUT CARDIOVERSION N/A 11/03/2019   Procedure: TRANSESOPHAGEAL ECHOCARDIOGRAM (TEE);  Surgeon: Prescott Gum, Collier Salina, MD;  Location: [place];  Service: Open Heart Surgery;  Laterality: N/A;   Past Medical History:  Diagnosis Date   Alcohol abuse    Anxiety    CAD (coronary artery disease)    Cardiac arrest ([place])    Cardiogenic shock (HCC)    CHF (congestive heart failure) (HCC)    Cough    Dysrhythmia    GERD (gastroesophageal reflux disease)    HFrEF (heart failure with reduced ejection fraction) ([place])  Hypertension    Hypertriglyceridemia    Ischemic cardiomyopathy    Right atrial thrombus    Tobacco abuse    There were no vitals taken for this visit.  Opioid Risk Score:    Fall Risk Score:  `1  Depression screen [facility] 2/9     10/21/2021    3:23 PM 09/19/2021    2:28 PM 08/19/2021    9:49 AM 06/26/2021    3:35 PM 05/02/2021    3:52 PM 03/28/2021    3:48 PM 03/10/2021    1:52 PM  Depression screen [facility] 2/9  Decreased Interest 0 0 0 0 0 0 0  Down, Depressed, Hopeless 0 0 0 0 0 0 0  PHQ - 2 Score 0 0 0 0 0 0 0     Review of Systems  Constitutional: Negative.   HENT: Negative.    Eyes: Negative.   Respiratory: Negative.    Cardiovascular: Negative.   Gastrointestinal: Negative.   Endocrine: Negative.   Genitourinary: Negative.   Musculoskeletal:  Positive for arthralgias, back pain and myalgias.  Skin: Negative.   Allergic/Immunologic: Negative.   Neurological:        Tingling  Hematological:  Bruises/bleeds easily.       Plavix  Psychiatric/Behavioral: Negative.    All other systems reviewed and are negative.      Objective:    Not performed as patient was seen via phone.       Assessment & Plan:  1. Left Shoulder Pain: Continue HEP as Tolerated. Continue current medication regimen. Continue to monitor. 11/22/2020  2.Left Greater Trochanter Bursitis: S/P Left Hip Injection with Dr Ranell Patrick on 09/03/2020, no relief noted he reports, Continue to alternate with Heat an Ice Therapy. Continue to monitor. 11/22/2020  3. Bilateral  Knees chondral defects: Continue HEP as Tolerated. Continue current medication regimen. 11/22/2020. Discussed results of bilateral knee MRIs which showed chondral deficits. Advised application of voltaren gel. Advised icing in evenings. Did not benefit from prior steroid taper. Referred to ortho for eval for arthroscopic debridement.  -discussed that pain could be secondary to opioid induced testoserone deficiency -testosterone level checked and it is is low. -Discussed the risks and benefits of testosterone replacement therapy and he is interested in trying this  -PSA should be monitored while using testosterone  replacement therapy as there is an increased risk of prostate cancer.  -recommended Wobenxyme  3. Chest Wall Discomfort/ Neuropathic Pain: Continue Gabapentin. Continue to Monitor. 11/22/2020. Prescribed lidocaine patch.  -Cymbalta 79m daily prescribed -Discussed Qutenza as an option for neuropathic pain control. Discussed that this is a capsaicin patch, stronger than capsaicin cream. Discussed that it is currently approved for diabetic peripheral neuropathy and post-herpetic neuralgia, but that it has also shown benefit in treating other forms of neuropathy. Provided patient with link to site to learn more about the patch: hCinemaBonus.fr Discussed that the patch would be placed in office and benefits usually last 3 months. Discussed that unintended exposure to capsaicin can cause severe irritation of eyes, mucous membranes, respiratory tract, and skin, but that Qutenza is a local treatment and does not have the systemic side effects of other nerve medications. Discussed that there may be pain, itching, erythema, and decreased sensory function associated with the application of Qutenza. Side effects usually subside within 1 week. A cold pack of analgesic medications can help with these side effects. Blood pressure can also be increased due to pain associated with administration of the patch.     -4. Chronic  Pain Syndrome: :  Continue Oxycodone 10/325 mg 6 times a day as needed for pain #180. We will continue the opioid monitoring program, this consists of regular clinic visits, examinations, urine drug screen, pill counts as well as use of [place] Controlled Substance Reporting system. A 12 month History has been reviewed on the [place] Controlled Substance Reporting System on 11/22/2020. Discussed risks and benefits of opioid medications.  -Provided with a pain relief journal and discussed that it contains foods and lifestyle tips to naturally help to improve pain. Discussed that  these lifestyle strategies are also very good for health unlike some medications which can have negative side effects. Discussed that the act of keeping a journal can be therapeutic and helpful to realize patterns what helps to trigger and alleviate pain.   -Discussed following foods that may reduce pain: 1) Ginger (especially studied for arthritis)- reduce leukotriene production to decrease inflammation 2) Blueberries- high in phytonutrients that decrease inflammation 3) Salmon- marine omega-3s reduce joint swelling and pain 4) Pumpkin seeds- reduce inflammation 5) dark chocolate- reduces inflammation 6) turmeric- reduces inflammation 7) tart cherries - reduce pain and stiffness 8) extra virgin olive oil - its compound olecanthal helps to block prostaglandins  9) chili peppers- can be eaten or applied topically via capsaicin 10) mint- helpful for headache, muscle aches, joint pain, and itching 11) garlic- reduces inflammation  Link to further information on diet for chronic pain: http://www.randall.com/   5) Insomnia: prescribed trazodone 31m HS  6) Low back pain: -Discussed results of MRI which shows disc degeneration -Given worsening pain and radiation of pain into legs intermittently with occasional weakness, new MRI ordered -discussed that hemp products not recommended while receiving opioids as may show up as THC in his urine sample -discussed that muscle and ligament pathology can also cause severe pain at times -encouraged continued exercise and working to send blood flow to injured area and promote healing -Provided with a pain relief journal and discussed that it contains foods and lifestyle tips to naturally help to improve pain. Discussed that these lifestyle strategies are also very good for health unlike some medications which can have negative side effects. Discussed that the act of keeping a journal can be  therapeutic and helpful to realize patterns what helps to trigger and alleviate pain.   -Wobenzyme -Discussed response to decrease in atorvastatin. Discussed that cholesterol is important for muscle function and lowering cholesterol levels too much is associated with joint pain in some patients. Discussed that some patients feel relief as soon as decreasing statin medications, whereas for others it takes a longer time to see if pain improves or not. Recommended repeating his lipid panel prior to when he is due for his next refill so that we make sure his LDL does not increase above 100.   7) Cervical radiuculitis: -Discussed that cervical XR showed disc degeneration  8) Testosterone deficiency Discussed that testosterone level has improved with his exercise and dietary changes  9) Trigger finger, left -recommended brace -if this does not help, recommend orthopedic follow-up for trigger point injection  10) Prostate cancer screening: -PSA check today.   11) HLD: -reviewed last lipid panel with him -discussed checking lipid panel with cardiologist.  -recommend decreasing atorvastatin to 23m but should discuss with cardiologist.   12. Suboptimal vitamin D -prescribed 50,000U ergocalciferol once per week for 7 weeks  13) Trigger finger -sent him a picture of [place]referred to surgery for steroid injection  if brace fails to help  6 minutes spent in discussion of his worsening lower back pain, recommended repeating MRI given his weakness and intermittent pain in lower extremities

## 2021-11-18 ENCOUNTER — Encounter: Payer: 59 | Admitting: Physical Medicine and Rehabilitation

## 2021-11-18 VITALS — BP 124/84 | HR 68 | Ht 68.0 in | Wt 178.2 lb

## 2021-11-18 DIAGNOSIS — G894 Chronic pain syndrome: Secondary | ICD-10-CM

## 2021-11-18 DIAGNOSIS — M48062 Spinal stenosis, lumbar region with neurogenic claudication: Secondary | ICD-10-CM | POA: Diagnosis present

## 2021-11-18 DIAGNOSIS — Z5181 Encounter for therapeutic drug level monitoring: Secondary | ICD-10-CM

## 2021-11-18 DIAGNOSIS — R0789 Other chest pain: Secondary | ICD-10-CM | POA: Diagnosis present

## 2021-11-18 DIAGNOSIS — M5442 Lumbago with sciatica, left side: Secondary | ICD-10-CM

## 2021-11-18 DIAGNOSIS — Z79891 Long term (current) use of opiate analgesic: Secondary | ICD-10-CM | POA: Diagnosis present

## 2021-11-18 DIAGNOSIS — M5441 Lumbago with sciatica, right side: Secondary | ICD-10-CM

## 2021-11-18 DIAGNOSIS — G8929 Other chronic pain: Secondary | ICD-10-CM | POA: Diagnosis present

## 2021-11-18 MED ORDER — OXYCODONE-ACETAMINOPHEN 10-325 MG PO TABS: 1.0000 | ORAL_TABLET | ORAL | 0 refills | Status: DC | PRN

## 2021-11-18 MED ORDER — GABAPENTIN 800 MG PO TABS: 800.0000 mg | ORAL_TABLET | Freq: Three times a day (TID) | ORAL | 3 refills | Status: DC

## 2021-11-18 NOTE — Progress Notes (Signed)
Subjective:    Patient ID: William Crawford, Crawford    DOB: [birth year], 52 y.o.   MRN: [id number]  HPI:   William Crawford who presents for follow-up of chronic low back pain, bilateral knee pain, chest pain, trigger finger, testosterone deficiency, and neck pain.   -currently pain is worse in both knees and his low back. He cannot say whether the testosterone supplement helps.  -has affected his ability to work, but he pushes through -back pain has been worsening -he would like to pursue MRI, has not yet heard about this.  -he takes Gabapentin. He takes 600mg  TID.  -He gets electric type pain down the legs almost every day more down left side than right.  -he asks if he can use hemp seed oil -he discussed with his cardiologist decreasing his atorvastatin dose and it has been decreased to 20mg . 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 He feels that this may have contributed to his worsening pain -been ok -he would like to know his vitamin D and testosterone level results and discuss treatment for his trigger finger -qutenza has helped minimize his chest pain, it is now gone. -had a death in the family- uncle had a pneumonia. He lives in [place]. He died before he could leave work. Her aunt is taking this pretty hard. He was really close to him. They used to talk 2-3 times per week.  -The knee pain is located just under the knee cap and extends medially to laterally -He is feeling much better this week and has been able to work, but still having significant knee and low back pain -He was unable to work today due to pain in both back and knees -Also has chest pain at times- sharp and lasts three minutes when he has it.  -Pain has grown excruciating but he  has been able to return to work as he needs to financially. -Discussed results of his bilateral knee MRI, his neck XR, and his back XR -He continues to have chest pain and had recent follow-up with cardiology. Would still like to try the Qutenza patch -he has never tried Cymbalta and would be willing to -he requests if oxycodone can be increased to 6 tablets per day.  -does not note radiation of pain into legs, but legs have given out dur to pain causing near fall -he does not note loss of bowel or bladder function -does note constipation -also having shooting pain down both arms into pinky fingers -discussed that bilateral knee pain and back pain could potentially be related to opioid-induced testosterone deficiency and he is agreeable to checking testosterone levels -he is also interested in ortho follow-up for his knee pain -he is feeling better today and was able to go to work -had burning pain over the weekend from the Qutenza that is now gone. Forgot to use the cooling gel. -he has never had PSA checked, discussed that result is 0.4 which is low, so we could trial testosterone patch given his deficiency and symptoms if he would like. He is agreeable and I have sent to his pharmacy.   Prior history: He states his pain is located in his left shoulder, lower back pain,  left hip and bilateral knee pain. He was in contact with Dr Carlis Abbott regarding his increase intensity of pain, per Dr Carlis Abbott note his oxycodone will be increased to 5 times a day as needed for pain. William Crawford understanding.  He rates his pain 4. His current exercise regime is walking and performing stretching exercises.  William Crawford Morphine equivalent is 60.00  MME.   Last UDs was Performed on 08/28/2020, it was consistent.   He is having a good summer.   He fell down and hit his right ankle and got a big bruise- not hurting too bad.   He had a good time with his grandson riding rolling coasters at [employer]  His chest pain returned and it feels sore and he plans to f/u with his surgeon regarding the pain. It feels sharp, hot, like a burning He takes Gabapentin 600mg - but that doesn't help unless used with the oxycodone.  Currently his pain is worse in bilateral knees- it is worst when he kneels, climbs ladders. He has a supportive community at work which allow him to limit his kneeling and he has been using pads when he kneels. His oxycodone is now only lasting him about 2.5 hours after he takes it and he has been taking them 5 times per day. He asks if there is anything stronger he can take. He has tried Voltaren gel in the past and its effect usually started an hour after application.  -got compression sleeves to apply to his knees and has been able to work -at times his knee still buckles and this is painful -he feels he is getting used to the pain with walking but as far as climbing he feels it a lot more -he got a steroid injection by orthopedics and did find that it helped but did not last very long.  -he has been trying to eat more foods that reduce inflammation  Low testosterone:  -he bought an over the counter supplement since the patch was not covered by insurance -takes magnesium, B1, zinc, vitamin D.      Pain Inventory Average Pain 7 Pain Right Now 8 My pain is sharp, burning, stabbing, and aching  In the last 24 hours, has pain interfered with the following? General activity 8 Relation with others 8 Enjoyment of life 8 What TIME of day is your pain at its worst? morning , daytime, and evening Sleep (in general) Fair  Pain is worse with: walking, bending, sitting, and some activites Pain improves with: rest, therapy/exercise, and medication Relief from Meds: 8  Family History  Problem Relation Age of Onset   Heart attack Father    Heart disease Father    Heart attack Paternal Grandfather    Heart disease Paternal Grandfather    Heart attack Paternal Uncle     Heart disease Paternal Uncle    Heart attack Paternal Uncle    Heart disease Paternal Uncle    Social History   Socioeconomic History   Marital status: Single    Spouse name: Not on file   Number of children: Not on file   Years of education: Not on file   Highest education level: Not on file  Occupational History   Not on file  Tobacco Use   Smoking status: Former    Types: Cigarettes    Quit date: 10/23/2019    Years since quitting: 2.0   Smokeless tobacco: Never  Vaping Use   Vaping Use: Never used  Substance  and Sexual Activity   Alcohol use: Not Currently    Comment: QUIT  IN MAY [id number]   Drug use: No   Sexual activity: Not on file  Other Topics Concern   Not on file  Social History Narrative   Not on file   Social Determinants of Health   Financial Resource Strain: Not on file  Food Insecurity: Not on file  Transportation Needs: Not on file  Physical Activity: Not on file  Stress: Not on file  Social Connections: Not on file   Past Surgical History:  Procedure Laterality Date   APPLICATION OF WOUND VAC  10/25/2019   Procedure: Application Of Wound Vac;  Surgeon: Kerin Perna, MD;  Location: [facility] OR;  Service: Open Heart Surgery;;   CANNULATION FOR ECMO (EXTRACORPOREAL MEMBRANE OXYGENATION)  10/25/2019   Procedure: Cannulation For Ecmo (Extracorporeal Membrane Oxygenation) @ 1531;  Surgeon: Kerin Perna, MD;  Location: [facility] OR;  Service: Open Heart Surgery;;   CANNULATION FOR ECMO (EXTRACORPOREAL MEMBRANE OXYGENATION) N/A 10/30/2019   Procedure: DECANNULATION OF ECMO (EXTRACORPOREAL MEMBRANE OXYGENATION);  Surgeon: Donata Clay, Theron Arista, MD;  Location: [facility] OR;  Service: Open Heart Surgery;  Laterality: N/A;  Pump standby   CHEST EXPLORATION  10/25/2019   Procedure: Chest Exploration for repair of coronary perforation (repair of LAD);  Surgeon: Donata Clay, Theron Arista, MD;  Location: [facility] OR;  Service: Open Heart Surgery;;   CORONARY CTO INTERVENTION N/A 10/25/2019   Procedure:  CORONARY CTO INTERVENTION;  Surgeon: Swaziland, Peter M, MD;  Location: [facility] INVASIVE CV LAB;  Service: Cardiovascular;  Laterality: N/A;   CORONARY STENT INTERVENTION N/A 10/17/2019   Procedure: CORONARY STENT INTERVENTION;  Surgeon: Tonny Bollman, MD;  Location: [facility] INVASIVE CV LAB;  Service: Cardiovascular;  Laterality: N/A;   LEFT HEART CATH AND CORONARY ANGIOGRAPHY N/A 10/17/2019   Procedure: LEFT HEART CATH AND CORONARY ANGIOGRAPHY;  Surgeon: Tonny Bollman, MD;  Location: [facility] INVASIVE CV LAB;  Service: Cardiovascular;  Laterality: N/A;   MEDIASTERNOTOMY  10/25/2019   Procedure: Median Sternotomy.;  Surgeon: Kerin Perna, MD;  Location: [facility] OR;  Service: Open Heart Surgery;;   PLACEMENT OF IMPELLA LEFT VENTRICULAR ASSIST DEVICE  10/30/2019   Procedure: Placement Of Impella Left Ventricular Assist Device 5.5 USING HEMASHIELD PLATINUM GRAFT;  Surgeon: Kerin Perna, MD;  Location: [facility] OR;  Service: Open Heart Surgery;;  graft placed into aorta   REMOVAL OF IMPELLA LEFT VENTRICULAR ASSIST DEVICE N/A 11/03/2019   Procedure: REMOVAL OF IMPELLA 5.5 LEFT VENTRICULAR ASSIST DEVICE;  Surgeon: Kerin Perna, MD;  Location: [facility] OR;  Service: Open Heart Surgery;  Laterality: N/A;   STERNAL CLOSURE N/A 10/30/2019   Procedure: Sternal Closure;  Surgeon: Kerin Perna, MD;  Location: [facility] OR;  Service: Open Heart Surgery;  Laterality: N/A;   STERNAL WIRES REMOVAL N/A 05/14/2020   Procedure: STERNAL WIRES REMOVAL;  Surgeon: Kerin Perna, MD;  Location: [facility] OR;  Service: Thoracic;  Laterality: N/A;   TEE WITHOUT CARDIOVERSION N/A 10/25/2019   Procedure: TRANSESOPHAGEAL ECHOCARDIOGRAM (TEE);  Surgeon: Donata Clay, Theron Arista, MD;  Location: [facility] OR;  Service: Open Heart Surgery;  Laterality: N/A;   TEE WITHOUT CARDIOVERSION N/A 10/30/2019   Procedure: TRANSESOPHAGEAL ECHOCARDIOGRAM (TEE);  Surgeon: Donata Clay, Theron Arista, MD;  Location: [facility] OR;  Service: Open Heart Surgery;  Laterality: N/A;   TEE WITHOUT CARDIOVERSION N/A  11/03/2019   Procedure: TRANSESOPHAGEAL ECHOCARDIOGRAM (TEE);  Surgeon: Donata Clay, Theron Arista, MD;  Location: [facility] OR;  Service: Open Heart Surgery;  Laterality: N/A;   Past Surgical History:  Procedure Laterality Date   APPLICATION OF WOUND VAC  10/25/2019   Procedure: Application Of Wound Vac;  Surgeon: Kerin Perna, MD;  Location: [facility] OR;  Service: Open Heart Surgery;;   CANNULATION FOR ECMO (EXTRACORPOREAL MEMBRANE OXYGENATION)  10/25/2019   Procedure: Cannulation For Ecmo (Extracorporeal Membrane Oxygenation) @ 1531;  Surgeon: Kerin Perna, MD;  Location: [facility] OR;  Service: Open Heart Surgery;;   CANNULATION FOR ECMO (EXTRACORPOREAL MEMBRANE OXYGENATION) N/A 10/30/2019   Procedure: DECANNULATION OF ECMO (EXTRACORPOREAL MEMBRANE OXYGENATION);  Surgeon: Donata Clay, Theron Arista, MD;  Location: [facility] OR;  Service: Open Heart Surgery;  Laterality: N/A;  Pump standby   CHEST EXPLORATION  10/25/2019   Procedure: Chest Exploration for repair of coronary perforation (repair of LAD);  Surgeon: Donata Clay, Theron Arista, MD;  Location: [facility] OR;  Service: Open Heart Surgery;;   CORONARY CTO INTERVENTION N/A 10/25/2019   Procedure: CORONARY CTO INTERVENTION;  Surgeon: Swaziland, Peter M, MD;  Location: [facility] INVASIVE CV LAB;  Service: Cardiovascular;  Laterality: N/A;   CORONARY STENT INTERVENTION N/A 10/17/2019   Procedure: CORONARY STENT INTERVENTION;  Surgeon: Tonny Bollman, MD;  Location: [facility] INVASIVE CV LAB;  Service: Cardiovascular;  Laterality: N/A;   LEFT HEART CATH AND CORONARY ANGIOGRAPHY N/A 10/17/2019   Procedure: LEFT HEART CATH AND CORONARY ANGIOGRAPHY;  Surgeon: Tonny Bollman, MD;  Location: [facility] INVASIVE CV LAB;  Service: Cardiovascular;  Laterality: N/A;   MEDIASTERNOTOMY  10/25/2019   Procedure: Median Sternotomy.;  Surgeon: Kerin Perna, MD;  Location: [facility] OR;  Service: Open Heart Surgery;;   PLACEMENT OF IMPELLA LEFT VENTRICULAR ASSIST DEVICE  10/30/2019   Procedure: Placement Of Impella Left Ventricular Assist Device 5.5  USING HEMASHIELD PLATINUM GRAFT;  Surgeon: Kerin Perna, MD;  Location: [facility] OR;  Service: Open Heart Surgery;;  graft placed into aorta   REMOVAL OF IMPELLA LEFT VENTRICULAR ASSIST DEVICE N/A 11/03/2019   Procedure: REMOVAL OF IMPELLA 5.5 LEFT VENTRICULAR ASSIST DEVICE;  Surgeon: Kerin Perna, MD;  Location: [facility] OR;  Service: Open Heart Surgery;  Laterality: N/A;   STERNAL CLOSURE N/A 10/30/2019   Procedure: Sternal Closure;  Surgeon: Kerin Perna, MD;  Location: [facility] OR;  Service: Open Heart Surgery;  Laterality: N/A;   STERNAL WIRES REMOVAL N/A 05/14/2020   Procedure: STERNAL WIRES REMOVAL;  Surgeon: Kerin Perna, MD;  Location: [facility] OR;  Service: Thoracic;  Laterality: N/A;   TEE WITHOUT CARDIOVERSION N/A 10/25/2019   Procedure: TRANSESOPHAGEAL ECHOCARDIOGRAM (TEE);  Surgeon: Donata Clay, Theron Arista, MD;  Location: [facility] OR;  Service: Open Heart Surgery;  Laterality: N/A;   TEE WITHOUT CARDIOVERSION N/A 10/30/2019   Procedure: TRANSESOPHAGEAL ECHOCARDIOGRAM (TEE);  Surgeon: Donata Clay, Theron Arista, MD;  Location: [facility] OR;  Service: Open Heart Surgery;  Laterality: N/A;   TEE WITHOUT CARDIOVERSION N/A 11/03/2019   Procedure: TRANSESOPHAGEAL ECHOCARDIOGRAM (TEE);  Surgeon: Donata Clay, Theron Arista, MD;  Location: [facility] OR;  Service: Open Heart Surgery;  Laterality: N/A;   Past Medical History:  Diagnosis Date   Alcohol abuse    Anxiety    CAD (coronary artery disease)    Cardiac arrest (HCC)    Cardiogenic shock (HCC)    CHF (congestive heart failure) (HCC)    Cough    Dysrhythmia    GERD (gastroesophageal reflux disease)    HFrEF (heart failure with reduced ejection fraction) (HCC)    Hypertension    Hypertriglyceridemia    Ischemic cardiomyopathy    Right atrial thrombus  Tobacco abuse    BP 124/84   Pulse 68   Ht 5\' 8"  (1.727 m)   Wt 178 lb 3.2 oz (80.8 kg)   SpO2 99%   BMI 27.10 kg/m   Opioid Risk Score:   Fall Risk Score:  `1  Depression screen [facility] 2/9     11/18/2021    9:13 AM  10/21/2021    3:23 PM 09/19/2021    2:28 PM 08/19/2021    9:49 AM 06/26/2021    3:35 PM 05/02/2021    3:52 PM 03/28/2021    3:48 PM  Depression screen PHQ 2/9  Decreased Interest 0 0 0 0 0 0 0  Down, Depressed, Hopeless 0 0 0 0 0 0 0  PHQ - 2 Score 0 0 0 0 0 0 0     Review of Systems  Constitutional: Negative.   HENT: Negative.    Eyes: Negative.   Respiratory: Negative.    Cardiovascular: Negative.   Gastrointestinal: Negative.   Endocrine: Negative.   Genitourinary: Negative.   Musculoskeletal:  Positive for arthralgias, back pain and myalgias.  Skin: Negative.   Allergic/Immunologic: Negative.   Neurological:        Tingling  Hematological:  Bruises/bleeds easily.       Plavix  Psychiatric/Behavioral: Negative.    All other systems reviewed and are negative.      Objective:    Gen: no distress, normal appearing HEENT: oral mucosa pink and moist, NCAT Cardio: Reg rate Chest: normal effort, normal rate of breathing Abd: soft, non-distended Ext: no edema Psych: pleasant, normal affect Skin: intact Neuro: Alert and oriented x3. MSK: LLE 4/5 strength, 5/5 strength on right side. +Slump test on left side.       Assessment & Plan:  1. Left Shoulder Pain: Continue HEP as Tolerated. Continue current medication regimen. Continue to monitor. 11/22/2020  2.Left Greater Trochanter Bursitis: S/P Left Hip Injection with Dr Carlis Abbott on 09/03/2020, no relief noted he reports, Continue to alternate with Heat an Ice Therapy. Continue to monitor. 11/22/2020  3. Bilateral  Knees chondral defects: Continue HEP as Tolerated. Continue current medication regimen. 11/22/2020. Discussed results of bilateral knee MRIs which showed chondral deficits. Advised application of voltaren gel. Advised icing in evenings. Did not benefit from prior steroid taper. Referred to ortho for eval for arthroscopic debridement.  -discussed that pain could be secondary to opioid induced testoserone  deficiency -testosterone level checked and it is is low. -Discussed the risks and benefits of testosterone replacement therapy and he is interested in trying this  -PSA should be monitored while using testosterone replacement therapy as there is an increased risk of prostate cancer.  -recommended Wobenxyme  3. Chest Wall Discomfort/ Neuropathic Pain: Continue Gabapentin. Continue to Monitor. 11/22/2020. Prescribed lidocaine patch.  -Cymbalta 20mg  daily prescribed -Stop Topamax.  -continue Percocet -Discussed Qutenza as an option for neuropathic pain control. Discussed that this is a capsaicin patch, stronger than capsaicin cream. Discussed that it is currently approved for diabetic peripheral neuropathy and post-herpetic neuralgia, but that it has also shown benefit in treating other forms of neuropathy. Provided patient with link to site to learn more about the patch: https://www.clark.biz/. Discussed that the patch would be placed in office and benefits usually last 3 months. Discussed that unintended exposure to capsaicin can cause severe irritation of eyes, mucous membranes, respiratory tract, and skin, but that Qutenza is a local treatment and does not have the systemic side effects of other nerve medications. Discussed that there  may be pain, itching, erythema, and decreased sensory function associated with the application of Qutenza. Side effects usually subside within 1 week. A cold pack of analgesic medications can help with these side effects. Blood pressure can also be increased due to pain associated with administration of the patch.     -4. Chronic Pain Syndrome: :  Refilled Oxycodone 10/325 mg 6 times a day as needed for pain #180. We will continue the opioid monitoring program, this consists of regular clinic visits, examinations, urine drug screen, pill counts as well as use of [place] Controlled Substance Reporting system. A 12 month History has been reviewed on the [place] Controlled Substance Reporting System on 11/22/2020. Discussed risks and benefits of opioid medications.  -Provided with a pain relief journal and discussed that it contains foods and lifestyle tips to naturally help to improve pain. Discussed that these lifestyle strategies are also very good for health unlike some medications which can have negative side effects. Discussed that the act of keeping a journal can be therapeutic and helpful to realize patterns what helps to trigger and alleviate pain.   -Discussed following foods that may reduce pain: 1) Ginger (especially studied for arthritis)- reduce leukotriene production to decrease inflammation 2) Blueberries- high in phytonutrients that decrease inflammation 3) Salmon- marine omega-3s reduce joint swelling and pain 4) Pumpkin seeds- reduce inflammation 5) dark chocolate- reduces inflammation 6) turmeric- reduces inflammation 7) tart cherries - reduce pain and stiffness 8) extra virgin olive oil - its compound olecanthal helps to block prostaglandins  9) chili peppers- can be eaten or applied topically via capsaicin 10) mint- helpful for headache, muscle aches, joint pain, and itching 11) garlic- reduces inflammation  Link to further information on diet for chronic pain: http://www.bray.com/   5) Insomnia: continue trazodone 50mg  HS  6) Low back pain: -Discussed results of MRI which shows disc degeneration -Given worsening pain and radiation of pain into legs intermittently with occasional weakness, new MRI ordered, will follow-up on this with April  -discussed that hemp products not recommended while receiving opioids as may show up as THC in his urine sample -discussed that muscle and ligament pathology can also cause severe pain at times -encouraged continued exercise and working to send blood flow to injured area and promote healing -Provided with a pain  relief journal and discussed that it contains foods and lifestyle tips to naturally help to improve pain. Discussed that these lifestyle strategies are also very good for health unlike some medications which can have negative side effects. Discussed that the act of keeping a journal can be therapeutic and helpful to realize patterns what helps to trigger and alleviate pain.   -Wobenzyme -Discussed response to decrease in atorvastatin. Discussed that cholesterol is important for muscle function and lowering cholesterol levels too much is associated with joint pain in some patients. Discussed that some patients feel relief as soon as decreasing statin medications, whereas for others it takes a longer time to see if pain improves or not. Recommended repeating his lipid panel prior to when he is due for his next refill so that we make sure his LDL does not increase above 100.   7) Cervical radiuculitis: -Discussed that cervical XR showed disc degeneration  8) Testosterone deficiency Discussed that testosterone level has improved with his exercise and dietary changes  9) Trigger finger, left -recommended brace -if this does not help, recommend orthopedic follow-up for trigger point injection  10) Prostate cancer screening: -PSA check today.  11) HLD: -reviewed last lipid panel with him -discussed checking lipid panel with cardiologist.  -recommend decreasing atorvastatin to 20mg , but should discuss with cardiologist.   12. Suboptimal vitamin D -prescribed 50,000U ergocalciferol once per week for 7 weeks -now can continue over the counter D3  13) Trigger finger -sent him a picture of brace he can purchase on [place] -referred to surgery for steroid injection if brace fails to help

## 2021-11-22 LAB — TOXASSURE SELECT,+ANTIDEPR,UR

## 2021-11-27 ENCOUNTER — Telehealth: Payer: Self-pay | Admitting: *Deleted

## 2021-11-27 NOTE — Telephone Encounter (Signed)
Urine drug screen for this encounter is consistent for prescribed medication. There is the presence of Ethyl Glucuronide but without Ethyl Sulfate (ETG/ETS) this is most likely from elevated blood sugar which he has had documented on BMP/CMP labs and not alcohol.

## 2021-12-06 ENCOUNTER — Ambulatory Visit (HOSPITAL_COMMUNITY)
Admission: RE | Admit: 2021-12-06 | Discharge: 2021-12-06 | Disposition: A | Discharge status: Home / Self Care | Payer: 59 | Source: Ambulatory Visit | Attending: Physical Medicine and Rehabilitation | Admitting: Physical Medicine and Rehabilitation

## 2021-12-06 DIAGNOSIS — M48062 Spinal stenosis, lumbar region with neurogenic claudication: Secondary | ICD-10-CM | POA: Diagnosis present

## 2021-12-08 ENCOUNTER — Encounter: Payer: 59 | Attending: Physical Medicine and Rehabilitation | Admitting: Physical Medicine and Rehabilitation

## 2021-12-08 ENCOUNTER — Ambulatory Visit (HOSPITAL_COMMUNITY)
Admission: RE | Admit: 2021-12-08 | Discharge: 2021-12-08 | Disposition: A | Discharge status: Home / Self Care | Payer: 59 | Source: Ambulatory Visit | Attending: Internal Medicine | Admitting: Internal Medicine

## 2021-12-08 DIAGNOSIS — M48062 Spinal stenosis, lumbar region with neurogenic claudication: Secondary | ICD-10-CM | POA: Diagnosis not present

## 2021-12-08 DIAGNOSIS — E782 Mixed hyperlipidemia: Secondary | ICD-10-CM | POA: Insufficient documentation

## 2021-12-08 DIAGNOSIS — G894 Chronic pain syndrome: Secondary | ICD-10-CM | POA: Insufficient documentation

## 2021-12-08 DIAGNOSIS — G4700 Insomnia, unspecified: Secondary | ICD-10-CM | POA: Diagnosis not present

## 2021-12-08 DIAGNOSIS — Z5181 Encounter for therapeutic drug level monitoring: Secondary | ICD-10-CM | POA: Insufficient documentation

## 2021-12-08 DIAGNOSIS — M5441 Lumbago with sciatica, right side: Secondary | ICD-10-CM | POA: Insufficient documentation

## 2021-12-08 DIAGNOSIS — G8929 Other chronic pain: Secondary | ICD-10-CM | POA: Insufficient documentation

## 2021-12-08 DIAGNOSIS — Z79891 Long term (current) use of opiate analgesic: Secondary | ICD-10-CM | POA: Insufficient documentation

## 2021-12-08 DIAGNOSIS — M5442 Lumbago with sciatica, left side: Secondary | ICD-10-CM | POA: Insufficient documentation

## 2021-12-08 DIAGNOSIS — M170 Bilateral primary osteoarthritis of knee: Secondary | ICD-10-CM | POA: Insufficient documentation

## 2021-12-08 LAB — LIPID PANEL
Cholesterol: 129 mg/dL (ref 0–200)
HDL: 43 mg/dL (ref >40)
LDL Cholesterol: 71 mg/dL (ref 0–99)
Total CHOL/HDL Ratio: 3 RATIO
Triglycerides: 75 mg/dL (ref <150)
VLDL: 15 mg/dL (ref 0–40)

## 2021-12-08 MED ORDER — AMITRIPTYLINE HCL 10 MG PO TABS: 10.0000 mg | ORAL_TABLET | Freq: Every day | ORAL | 1 refills | Status: DC

## 2021-12-08 NOTE — Progress Notes (Signed)
Subjective:    Patient ID: William Crawford, male    DOB: 1970/09/28, 51 y.o.   MRN: [id number]  HPI: An audio/video tele-health visit is felt to be the most appropriate encounter for this patient at this time. This is a follow up tele-visit via phone. The patient is at home. MD is at office. Prior to scheduling this appointment, our staff discussed the limitations of evaluation and management by telemedicine and the availability of in-person appointments. The patient expressed understanding and agreed to proceed.    William Crawford is a 51 y.o. male who presents for follow-up of chronic low back pain, bilateral knee pain, chest pain, trigger finger, testosterone deficiency, and neck pain.   -currently pain is worse in both knees and his low back. He cannot say whether the testosterone supplement helps.  -has affected his ability to work, but he pushes through -back pain has been worsening -he would like to pursue MRI, has not yet heard about this.  -he takes Gabapentin. He takes  TID. He thinks this helps with his opioid.  -he asks what else he can take during the day  -His pain medication does not last as long as he would like,  -some days his pain is worse.  -He gets electric type pain down the legs almost every day more down left side than right.  -he asks if he can use hemp seed oil -he discussed with his cardiologist decreasing his atorvastatin dose and it has been decreased to . 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 He feels that this may have contributed to his worsening pain -been ok -he would like to know his vitamin D and testosterone level results and discuss treatment for his trigger finger -qutenza has helped minimize his chest pain, it is now  gone. -had a death in the family- uncle had a pneumonia. He lives in [place]. He died before he could leave work. Her aunt is taking this pretty hard. He was really close to him. They used to talk 2-3 times per week.  -The knee pain is located just under the knee cap and extends medially to laterally -He is feeling much better this week and has been able to work, but still having significant knee and low back pain -He was unable to work today due to pain in both back and knees -Also has chest pain at times- sharp and lasts three minutes when he has it.  -Pain has grown excruciating but he has been able to return to work as he needs to financially. -Discussed results of his bilateral knee MRI, his neck XR, and his back XR -He continues to have chest pain and had recent follow-up with cardiology. Would still like to try the Qutenza patch -the Cymbalta did not help.  -he requests if oxycodone can be increased to 6 tablets per day.  -does not note radiation of pain into legs, but legs have given out dur to pain causing near fall -he does not note loss of bowel or bladder function -does note constipation -also having shooting pain down both arms into pinky fingers -discussed that bilateral knee pain and back pain could potentially be related to opioid-induced testosterone deficiency and he is agreeable to checking  testosterone levels -he is also interested in ortho follow-up for his knee pain -he is feeling better today and was able to go to work -had burning pain over the weekend from the Qutenza that is now gone. Forgot to use the cooling gel. -he has never had PSA checked, discussed that result is 0.4 which is low, so we could trial testosterone patch given his deficiency and symptoms if he would like. He is agreeable and I have sent to his pharmacy.   Prior history: He states his pain is located in his left shoulder, lower back pain, left hip and bilateral knee pain. He was in contact with Dr  William Crawford regarding his increase intensity of pain, per Dr William Crawford note his oxycodone will be increased to 5 times a day as needed for pain. Mr. William Crawford understanding.  He rates his pain 4. His current exercise regime is walking and performing stretching exercises.  Mr. William Crawford equivalent is 60.00  MME.   Last UDs was Performed on 08/28/2020, it was consistent.   He is having a good summer.   He fell down and hit his right ankle and got a big bruise- not hurting too bad.   He had a good time with his grandson riding rolling coasters at [employer]  His chest pain returned and it feels sore and he plans to f/u with his surgeon regarding the pain. It feels sharp, hot, like a burning He takes Gabapentin 600mg - but that doesn't help unless used with the oxycodone.  Currently his pain is worse in bilateral knees- it is worst when he kneels, climbs ladders. He has a supportive community at work which allow him to limit his kneeling and he has been using pads when he kneels. His oxycodone is now only lasting him about 2.5 hours after he takes it and he has been taking them 5 times per day. He asks if there is anything stronger he can take. He has tried Voltaren gel in the past and its effect usually started an hour after application.  -got compression sleeves to apply to his knees and has been able to work -at times his knee still buckles and this is painful -he feels he is getting used to the pain with walking but as far as climbing he feels it a lot more -he got a steroid injection by orthopedics and did find that it helped but did not last very long.  -he has been trying to eat more foods that reduce inflammation  Low testosterone:  -he bought an over the counter supplement since the patch was not covered by insurance -takes magnesium, B1, zinc, vitamin D.      Pain Inventory Average Pain 7 Pain Right Now 8 My pain is sharp, burning, stabbing, and aching  In the last 24  hours, has pain interfered with the following? General activity 8 Relation with others 8 Enjoyment of life 8 What TIME of day is your pain at its worst? morning , daytime, and evening Sleep (in general) Fair  Pain is worse with: walking, bending, sitting, and some activites Pain improves with: rest, therapy/exercise, and medication Relief from Meds: 8  Family History  Problem Relation Age of Onset   Heart attack Father    Heart disease Father    Heart attack Paternal Grandfather    Heart disease Paternal Grandfather    Heart attack Paternal Uncle    Heart disease Paternal Uncle    Heart attack Paternal Uncle  Heart disease Paternal Uncle    Social History   Socioeconomic History   Marital status: Single    Spouse name: Not on file   Number of children: Not on file   Years of education: Not on file   Highest education level: Not on file  Occupational History   Not on file  Tobacco Use   Smoking status: Former    Types: Cigarettes    Quit date: 10/23/2019    Years since quitting: 2.1   Smokeless tobacco: Never  Vaping Use   Vaping Use: Never used  Substance and Sexual Activity   Alcohol use: Not Currently    Comment: QUIT  IN MAY 16109   Drug use: No   Sexual activity: Not on file  Other Topics Concern   Not on file  Social History Narrative   Not on file   Social Determinants of Health   Financial Resource Strain: Not on file  Food Insecurity: Not on file  Transportation Needs: Not on file  Physical Activity: Not on file  Stress: Not on file  Social Connections: Not on file   Past Surgical History:  Procedure Laterality Date   APPLICATION OF WOUND VAC  10/25/2019   Procedure: Application Of Wound Vac;  Surgeon: Kerin Perna, MD;  Location: [facility] OR;  Service: Open Heart Surgery;;   CANNULATION FOR ECMO (EXTRACORPOREAL MEMBRANE OXYGENATION)  10/25/2019   Procedure: Cannulation For Ecmo (Extracorporeal Membrane Oxygenation) @ 1531;  Surgeon: Donata Clay,  Theron Arista, MD;  Location: [facility] OR;  Service: Open Heart Surgery;;   CANNULATION FOR ECMO (EXTRACORPOREAL MEMBRANE OXYGENATION) N/A 10/30/2019   Procedure: DECANNULATION OF ECMO (EXTRACORPOREAL MEMBRANE OXYGENATION);  Surgeon: Donata Clay, Theron Arista, MD;  Location: [facility] OR;  Service: Open Heart Surgery;  Laterality: N/A;  Pump standby   CHEST EXPLORATION  10/25/2019   Procedure: Chest Exploration for repair of coronary perforation (repair of LAD);  Surgeon: Donata Clay, Theron Arista, MD;  Location: [facility] OR;  Service: Open Heart Surgery;;   CORONARY CTO INTERVENTION N/A 10/25/2019   Procedure: CORONARY CTO INTERVENTION;  Surgeon: Swaziland, Peter M, MD;  Location: [facility] INVASIVE CV LAB;  Service: Cardiovascular;  Laterality: N/A;   CORONARY STENT INTERVENTION N/A 10/17/2019   Procedure: CORONARY STENT INTERVENTION;  Surgeon: Tonny Bollman, MD;  Location: [facility] INVASIVE CV LAB;  Service: Cardiovascular;  Laterality: N/A;   LEFT HEART CATH AND CORONARY ANGIOGRAPHY N/A 10/17/2019   Procedure: LEFT HEART CATH AND CORONARY ANGIOGRAPHY;  Surgeon: Tonny Bollman, MD;  Location: [facility] INVASIVE CV LAB;  Service: Cardiovascular;  Laterality: N/A;   MEDIASTERNOTOMY  10/25/2019   Procedure: Median Sternotomy.;  Surgeon: Kerin Perna, MD;  Location: [facility] OR;  Service: Open Heart Surgery;;   PLACEMENT OF IMPELLA LEFT VENTRICULAR ASSIST DEVICE  10/30/2019   Procedure: Placement Of Impella Left Ventricular Assist Device 5.5 USING HEMASHIELD PLATINUM GRAFT;  Surgeon: Kerin Perna, MD;  Location: [facility] OR;  Service: Open Heart Surgery;;  graft placed into aorta   REMOVAL OF IMPELLA LEFT VENTRICULAR ASSIST DEVICE N/A 11/03/2019   Procedure: REMOVAL OF IMPELLA 5.5 LEFT VENTRICULAR ASSIST DEVICE;  Surgeon: Kerin Perna, MD;  Location: [facility] OR;  Service: Open Heart Surgery;  Laterality: N/A;   STERNAL CLOSURE N/A 10/30/2019   Procedure: Sternal Closure;  Surgeon: Kerin Perna, MD;  Location: [facility] OR;  Service: Open Heart Surgery;  Laterality: N/A;   STERNAL  WIRES REMOVAL N/A 05/14/2020   Procedure: STERNAL WIRES REMOVAL;  Surgeon: Kerin Perna, MD;  Location: [facility] OR;  Service: Thoracic;  Laterality: N/A;   TEE WITHOUT CARDIOVERSION N/A 10/25/2019   Procedure: TRANSESOPHAGEAL ECHOCARDIOGRAM (TEE);  Surgeon: Donata Clay, Theron Arista, MD;  Location: [facility] OR;  Service: Open Heart Surgery;  Laterality: N/A;   TEE WITHOUT CARDIOVERSION N/A 10/30/2019   Procedure: TRANSESOPHAGEAL ECHOCARDIOGRAM (TEE);  Surgeon: Donata Clay, Theron Arista, MD;  Location: [facility] OR;  Service: Open Heart Surgery;  Laterality: N/A;   TEE WITHOUT CARDIOVERSION N/A 11/03/2019   Procedure: TRANSESOPHAGEAL ECHOCARDIOGRAM (TEE);  Surgeon: Donata Clay, Theron Arista, MD;  Location: [facility] OR;  Service: Open Heart Surgery;  Laterality: N/A;   Past Surgical History:  Procedure Laterality Date   APPLICATION OF WOUND VAC  10/25/2019   Procedure: Application Of Wound Vac;  Surgeon: Kerin Perna, MD;  Location: [facility] OR;  Service: Open Heart Surgery;;   CANNULATION FOR ECMO (EXTRACORPOREAL MEMBRANE OXYGENATION)  10/25/2019   Procedure: Cannulation For Ecmo (Extracorporeal Membrane Oxygenation) @ 1531;  Surgeon: Kerin Perna, MD;  Location: [facility] OR;  Service: Open Heart Surgery;;   CANNULATION FOR ECMO (EXTRACORPOREAL MEMBRANE OXYGENATION) N/A 10/30/2019   Procedure: DECANNULATION OF ECMO (EXTRACORPOREAL MEMBRANE OXYGENATION);  Surgeon: Donata Clay, Theron Arista, MD;  Location: [facility] OR;  Service: Open Heart Surgery;  Laterality: N/A;  Pump standby   CHEST EXPLORATION  10/25/2019   Procedure: Chest Exploration for repair of coronary perforation (repair of LAD);  Surgeon: Donata Clay, Theron Arista, MD;  Location: [facility] OR;  Service: Open Heart Surgery;;   CORONARY CTO INTERVENTION N/A 10/25/2019   Procedure: CORONARY CTO INTERVENTION;  Surgeon: Swaziland, Peter M, MD;  Location: [facility] INVASIVE CV LAB;  Service: Cardiovascular;  Laterality: N/A;   CORONARY STENT INTERVENTION N/A 10/17/2019   Procedure: CORONARY STENT INTERVENTION;  Surgeon: Tonny Bollman, MD;   Location: [facility] INVASIVE CV LAB;  Service: Cardiovascular;  Laterality: N/A;   LEFT HEART CATH AND CORONARY ANGIOGRAPHY N/A 10/17/2019   Procedure: LEFT HEART CATH AND CORONARY ANGIOGRAPHY;  Surgeon: Tonny Bollman, MD;  Location: [facility] INVASIVE CV LAB;  Service: Cardiovascular;  Laterality: N/A;   MEDIASTERNOTOMY  10/25/2019   Procedure: Median Sternotomy.;  Surgeon: Kerin Perna, MD;  Location: [facility] OR;  Service: Open Heart Surgery;;   PLACEMENT OF IMPELLA LEFT VENTRICULAR ASSIST DEVICE  10/30/2019   Procedure: Placement Of Impella Left Ventricular Assist Device 5.5 USING HEMASHIELD PLATINUM GRAFT;  Surgeon: Kerin Perna, MD;  Location: [facility] OR;  Service: Open Heart Surgery;;  graft placed into aorta   REMOVAL OF IMPELLA LEFT VENTRICULAR ASSIST DEVICE N/A 11/03/2019   Procedure: REMOVAL OF IMPELLA 5.5 LEFT VENTRICULAR ASSIST DEVICE;  Surgeon: Kerin Perna, MD;  Location: [facility] OR;  Service: Open Heart Surgery;  Laterality: N/A;   STERNAL CLOSURE N/A 10/30/2019   Procedure: Sternal Closure;  Surgeon: Kerin Perna, MD;  Location: [facility] OR;  Service: Open Heart Surgery;  Laterality: N/A;   STERNAL WIRES REMOVAL N/A 05/14/2020   Procedure: STERNAL WIRES REMOVAL;  Surgeon: Kerin Perna, MD;  Location: [facility] OR;  Service: Thoracic;  Laterality: N/A;   TEE WITHOUT CARDIOVERSION N/A 10/25/2019   Procedure: TRANSESOPHAGEAL ECHOCARDIOGRAM (TEE);  Surgeon: Donata Clay, Theron Arista, MD;  Location: [facility] OR;  Service: Open Heart Surgery;  Laterality: N/A;   TEE WITHOUT CARDIOVERSION N/A 10/30/2019   Procedure: TRANSESOPHAGEAL ECHOCARDIOGRAM (TEE);  Surgeon: Donata Clay, Theron Arista, MD;  Location: [facility] OR;  Service: Open Heart Surgery;  Laterality: N/A;   TEE WITHOUT CARDIOVERSION N/A 11/03/2019   Procedure: TRANSESOPHAGEAL ECHOCARDIOGRAM (TEE);  Surgeon: Donata Clay, Theron Arista, MD;  Location: [facility] OR;  Service: Open Heart Surgery;  Laterality: N/A;   Past Medical History:  Diagnosis Date   Alcohol abuse    Anxiety    CAD (coronary  artery disease)    Cardiac arrest (HCC)    Cardiogenic shock (HCC)    CHF (congestive heart failure) (HCC)    Cough    Dysrhythmia    GERD (gastroesophageal reflux disease)    HFrEF (heart failure with reduced ejection fraction) (HCC)    Hypertension    Hypertriglyceridemia    Ischemic cardiomyopathy    Right atrial thrombus    Tobacco abuse    There were no vitals taken for this visit.  Opioid Risk Score:   Fall Risk Score:  `1  Depression screen [facility] 2/9     11/18/2021    9:13 AM 10/21/2021    3:23 PM 09/19/2021    2:28 PM 08/19/2021    9:49 AM 06/26/2021    3:35 PM 05/02/2021    3:52 PM 03/28/2021    3:48 PM  Depression screen [facility] 2/9  Decreased Interest 0 0 0 0 0 0 0  Down, Depressed, Hopeless 0 0 0 0 0 0 0  PHQ - 2 Score 0 0 0 0 0 0 0     Review of Systems  Constitutional: Negative.   HENT: Negative.    Eyes: Negative.   Respiratory: Negative.    Cardiovascular: Negative.   Gastrointestinal: Negative.   Endocrine: Negative.   Genitourinary: Negative.   Musculoskeletal:  Positive for arthralgias, back pain and myalgias.  Skin: Negative.   Allergic/Immunologic: Negative.   Neurological:        Tingling  Hematological:  Bruises/bleeds easily.       Plavix  Psychiatric/Behavioral: Negative.    All other systems reviewed and are negative.      Objective:    Not performed.       Assessment & Plan:  1. Left Shoulder Pain: Continue HEP as Tolerated. Continue current medication regimen. Continue to monitor. 11/22/2020  2.Left Greater Trochanter Bursitis: S/P Left Hip Injection with Dr William Crawford on 09/03/2020, no relief noted he reports, Continue to alternate with Heat an Ice Therapy. Continue to monitor. 11/22/2020  3. Bilateral  Knees chondral defects: Continue HEP as Tolerated. Continue current medication regimen. 11/22/2020. Discussed results of bilateral knee MRIs which showed chondral deficits. Advised application of voltaren gel. Advised icing in evenings.  Did not benefit from prior steroid taper. Referred to ortho for eval for arthroscopic debridement.  -discussed that pain could be secondary to opioid induced testoserone deficiency -testosterone level checked and it is is low. -Discussed the risks and benefits of testosterone replacement therapy and he is interested in trying this  -PSA should be monitored while using testosterone replacement therapy as there is an increased risk of prostate cancer.  -recommended Wobenxyme  3. Chest Wall Discomfort/ Neuropathic Pain: Continue Gabapentin. Continue to Monitor. 11/22/2020. Prescribed lidocaine patch.  -Cymbalta 20mg  daily prescribed -Stop Topamax.  -continue Percocet -Discussed Qutenza as an option for neuropathic pain control. Discussed that this is a capsaicin patch, stronger than capsaicin cream. Discussed that it is currently approved for diabetic peripheral neuropathy and post-herpetic neuralgia, but that it has also shown benefit in treating other forms of neuropathy. Provided patient with link to site to learn more about the patch: . Discussed that the patch would be placed in office and benefits usually last 3 months. Discussed that unintended exposure to capsaicin can cause severe irritation of eyes, mucous  membranes, respiratory tract, and skin, but that Qutenza is a local treatment and does not have the systemic side effects of other nerve medications. Discussed that there may be pain, itching, erythema, and decreased sensory function associated with the application of Qutenza. Side effects usually subside within 1 week. A cold pack of analgesic medications can help with these side effects. Blood pressure can also be increased due to pain associated with administration of the patch.    -4. Chronic Pain Syndrome: :  Continue Oxycodone 10/325 mg 6 times a day as needed for pain #180. We will continue the opioid monitoring program, this consists of regular clinic visits,  examinations, urine drug screen, pill counts as well as use of [place] Controlled Substance Reporting system. A 12 month History has been reviewed on the [place] Controlled Substance Reporting System on 11/22/2020. Discussed risks and benefits of opioid medications.  -Provided with a pain relief journal and discussed that it contains foods and lifestyle tips to naturally help to improve pain. Discussed that these lifestyle strategies are also very good for health unlike some medications which can have negative side effects. Discussed that the act of keeping a journal can be therapeutic and helpful to realize patterns what helps to trigger and alleviate pain.   -Discussed following foods that may reduce pain: 1) Ginger (especially studied for arthritis)- reduce leukotriene production to decrease inflammation 2) Blueberries- high in phytonutrients that decrease inflammation 3) Salmon- marine omega-3s reduce joint swelling and pain 4) Pumpkin seeds- reduce inflammation 5) dark chocolate- reduces inflammation 6) turmeric- reduces inflammation 7) tart cherries - reduce pain and stiffness 8) extra virgin olive oil - its compound olecanthal helps to block prostaglandins  9) chili peppers- can be eaten or applied topically via capsaicin 10) mint- helpful for headache, muscle aches, joint pain, and itching 11) garlic- reduces inflammation  Link to further information on diet for chronic pain: http://[email or web address].[email or web address]   5) Insomnia: continue trazodone  HS  6) Low back pain: -Discussed results of MRI which shows disc degeneration, spinal stenosis, and bone marrow edema -Given worsening pain and radiation of pain into legs intermittently with occasional weakness, new MRI ordered, will follow-up on this with April  -discussed that hemp products not recommended while receiving opioids as may show up as THC in his urine  sample -discussed that muscle and ligament pathology can also cause severe pain at times -encouraged continued exercise and working to send blood flow to injured area and promote healing -Provided with a pain relief journal and discussed that it contains foods and lifestyle tips to naturally help to improve pain. Discussed that these lifestyle strategies are also very good for health unlike some medications which can have negative side effects. Discussed that the act of keeping a journal can be therapeutic and helpful to realize patterns what helps to trigger and alleviate pain.   -Wobenzyme -Discussed response to decrease in atorvastatin. Discussed that cholesterol is important for muscle function and lowering cholesterol levels too much is associated with joint pain in some patients. Discussed that some patients feel relief as soon as decreasing statin medications, whereas for others it takes a longer time to see if pain improves or not. Recommended repeating his lipid panel prior to when he is due for his next refill so that we make sure his LDL does not increase above 100.   7) Cervical radiuculitis: -Discussed that cervical XR showed disc degeneration  8) Testosterone deficiency Discussed that testosterone level has improved with his  exercise and dietary changes  9) Trigger finger, left -recommended brace -if this does not help, recommend orthopedic follow-up for trigger point injection  10) Prostate cancer screening: -PSA check today.   11) HLD: -reviewed last lipid panel with him -discussed checking lipid panel with cardiologist.  -recommend decreasing atorvastatin to 20mg , but should discuss with cardiologist.   12. Suboptimal vitamin D -prescribed 50,000U ergocalciferol once per week for 7 weeks -now can continue over the counter D3  13) Trigger finger -sent him a picture of brace he can purchase on [place] -referred to surgery for steroid injection if brace fails to  help  14) Insomnia -prescribed amitriptyline 10mg  HS    13 minutes spent in discussion of lumbar MRI which shows disc degeneration and spinal stenosis without nerve impingement, discussed response to opioids, discussed cardiology appointment today

## 2021-12-19 ENCOUNTER — Encounter: Payer: 59 | Admitting: Registered Nurse

## 2021-12-19 ENCOUNTER — Encounter: Payer: Self-pay | Admitting: Registered Nurse

## 2021-12-19 VITALS — BP 143/92 | HR 82 | Ht 68.0 in | Wt 183.0 lb

## 2021-12-19 DIAGNOSIS — G894 Chronic pain syndrome: Secondary | ICD-10-CM

## 2021-12-19 DIAGNOSIS — G4700 Insomnia, unspecified: Secondary | ICD-10-CM

## 2021-12-19 DIAGNOSIS — M5442 Lumbago with sciatica, left side: Secondary | ICD-10-CM | POA: Diagnosis present

## 2021-12-19 DIAGNOSIS — Z79891 Long term (current) use of opiate analgesic: Secondary | ICD-10-CM | POA: Diagnosis present

## 2021-12-19 DIAGNOSIS — M5441 Lumbago with sciatica, right side: Secondary | ICD-10-CM | POA: Insufficient documentation

## 2021-12-19 DIAGNOSIS — G8929 Other chronic pain: Secondary | ICD-10-CM

## 2021-12-19 DIAGNOSIS — M170 Bilateral primary osteoarthritis of knee: Secondary | ICD-10-CM

## 2021-12-19 DIAGNOSIS — M48062 Spinal stenosis, lumbar region with neurogenic claudication: Secondary | ICD-10-CM

## 2021-12-19 DIAGNOSIS — Z5181 Encounter for therapeutic drug level monitoring: Secondary | ICD-10-CM | POA: Diagnosis present

## 2021-12-19 MED ORDER — OXYCODONE HCL 15 MG PO TABS: 15.0000 mg | ORAL_TABLET | Freq: Four times a day (QID) | ORAL | 0 refills | Status: DC | PRN

## 2021-12-19 NOTE — Patient Instructions (Signed)
Send a My- Chart Message on the third week of August with update on medication change.

## 2021-12-19 NOTE — Progress Notes (Signed)
Subjective:    Patient ID: William Crawford, male    DOB: 09/03/1970, 51 y.o.   MRN: [id number]  HPI: William Crawford is a 51 y.o. male who returns for follow up appointment for chronic pain and medication refill. He states his pain is located in his lower back and bilateral knees. William Crawford spoke with Dr Carlis Abbott regarding his pain not being controlled with current medication regimen. His medication was changed to Oxycodone 15mg , he was instructed to send a My Chart message in two weeks with a update on medication change, he verbalizes understanding. Dr was in agreement with change of Oxycodone 15 mg.  He rates his pain 7. His current exercise regime is walking and performing stretching exercises.  William Crawford Morphine equivalent is 90.00 MME.    Last UDS was Performed on 11/18/2021, see note for details.    Pain Inventory Average Pain 9 Pain Right Now 7 My pain is constant, sharp, burning, dull, tingling, and aching  In the last 24 hours, has pain interfered with the following? General activity 5 Relation with others 0 Enjoyment of life 0 What TIME of day is your pain at its worst? morning , daytime, and night Sleep (in general) Fair  Pain is worse with: bending, sitting, and some activites Pain improves with: pacing activities, medication, and injections Relief from Meds: 9  Family History  Problem Relation Age of Onset   Heart attack Father    Heart disease Father    Heart attack Paternal Grandfather    Heart disease Paternal Grandfather    Heart attack Paternal Uncle    Heart disease Paternal Uncle    Heart attack Paternal Uncle    Heart disease Paternal Uncle    Social History   Socioeconomic History   Marital status: Single    Spouse name: Not on file   Number of children: Not on file   Years of education: Not on file   Highest education level: Not on file  Occupational History   Not on file  Tobacco Use   Smoking status: Former    Types:  Cigarettes    Quit date: 10/23/2019    Years since quitting: 2.1   Smokeless tobacco: Never  Vaping Use   Vaping Use: Never used  Substance and Sexual Activity   Alcohol use: Not Currently    Comment: QUIT  IN MAY 10/25/2019   Drug use: No   Sexual activity: Not on file  Other Topics Concern   Not on file  Social History Narrative   Not on file   Social Determinants of Health   Financial Resource Strain: Not on file  Food Insecurity: Not on file  Transportation Needs: Not on file  Physical Activity: Not on file  Stress: Not on file  Social Connections: Not on file   Past Surgical History:  Procedure Laterality Date   APPLICATION OF WOUND VAC  10/25/2019   Procedure: Application Of Wound Vac;  Surgeon: 12/25/2019, MD;  Location: [facility] OR;  Service: Open Heart Surgery;;   CANNULATION FOR ECMO (EXTRACORPOREAL MEMBRANE OXYGENATION)  10/25/2019   Procedure: Cannulation For Ecmo (Extracorporeal Membrane Oxygenation) @ 1531;  Surgeon: 12/25/2019, Donata Clay, MD;  Location: [facility] OR;  Service: Open Heart Surgery;;   CANNULATION FOR ECMO (EXTRACORPOREAL MEMBRANE OXYGENATION) N/A 10/30/2019   Procedure: DECANNULATION OF ECMO (EXTRACORPOREAL MEMBRANE OXYGENATION);  Surgeon: 12/30/2019, Donata Clay, MD;  Location: [facility] OR;  Service: Open Heart Surgery;  Laterality: N/A;  Pump standby  CHEST EXPLORATION  10/25/2019   Procedure: Chest Exploration for repair of coronary perforation (repair of LAD);  Surgeon: Donata Clay, Theron Arista, MD;  Location: [facility] OR;  Service: Open Heart Surgery;;   CORONARY CTO INTERVENTION N/A 10/25/2019   Procedure: CORONARY CTO INTERVENTION;  Surgeon: Swaziland, Peter M, MD;  Location: [facility] INVASIVE CV LAB;  Service: Cardiovascular;  Laterality: N/A;   CORONARY STENT INTERVENTION N/A 10/17/2019   Procedure: CORONARY STENT INTERVENTION;  Surgeon: Tonny Bollman, MD;  Location: [facility] INVASIVE CV LAB;  Service: Cardiovascular;  Laterality: N/A;   LEFT HEART CATH AND CORONARY ANGIOGRAPHY N/A 10/17/2019   Procedure:  LEFT HEART CATH AND CORONARY ANGIOGRAPHY;  Surgeon: Tonny Bollman, MD;  Location: [facility] INVASIVE CV LAB;  Service: Cardiovascular;  Laterality: N/A;   MEDIASTERNOTOMY  10/25/2019   Procedure: Median Sternotomy.;  Surgeon: Kerin Perna, MD;  Location: [facility] OR;  Service: Open Heart Surgery;;   PLACEMENT OF IMPELLA LEFT VENTRICULAR ASSIST DEVICE  10/30/2019   Procedure: Placement Of Impella Left Ventricular Assist Device 5.5 USING HEMASHIELD PLATINUM GRAFT;  Surgeon: Kerin Perna, MD;  Location: [facility] OR;  Service: Open Heart Surgery;;  graft placed into aorta   REMOVAL OF IMPELLA LEFT VENTRICULAR ASSIST DEVICE N/A 11/03/2019   Procedure: REMOVAL OF IMPELLA 5.5 LEFT VENTRICULAR ASSIST DEVICE;  Surgeon: Kerin Perna, MD;  Location: [facility] OR;  Service: Open Heart Surgery;  Laterality: N/A;   STERNAL CLOSURE N/A 10/30/2019   Procedure: Sternal Closure;  Surgeon: Kerin Perna, MD;  Location: [facility] OR;  Service: Open Heart Surgery;  Laterality: N/A;   STERNAL WIRES REMOVAL N/A 05/14/2020   Procedure: STERNAL WIRES REMOVAL;  Surgeon: Kerin Perna, MD;  Location: [facility] OR;  Service: Thoracic;  Laterality: N/A;   TEE WITHOUT CARDIOVERSION N/A 10/25/2019   Procedure: TRANSESOPHAGEAL ECHOCARDIOGRAM (TEE);  Surgeon: Donata Clay, Theron Arista, MD;  Location: [facility] OR;  Service: Open Heart Surgery;  Laterality: N/A;   TEE WITHOUT CARDIOVERSION N/A 10/30/2019   Procedure: TRANSESOPHAGEAL ECHOCARDIOGRAM (TEE);  Surgeon: Donata Clay, Theron Arista, MD;  Location: [facility] OR;  Service: Open Heart Surgery;  Laterality: N/A;   TEE WITHOUT CARDIOVERSION N/A 11/03/2019   Procedure: TRANSESOPHAGEAL ECHOCARDIOGRAM (TEE);  Surgeon: Donata Clay, Theron Arista, MD;  Location: [facility] OR;  Service: Open Heart Surgery;  Laterality: N/A;   Past Surgical History:  Procedure Laterality Date   APPLICATION OF WOUND VAC  10/25/2019   Procedure: Application Of Wound Vac;  Surgeon: Kerin Perna, MD;  Location: [facility] OR;  Service: Open Heart Surgery;;   CANNULATION FOR ECMO  (EXTRACORPOREAL MEMBRANE OXYGENATION)  10/25/2019   Procedure: Cannulation For Ecmo (Extracorporeal Membrane Oxygenation) @ 1531;  Surgeon: Kerin Perna, MD;  Location: [facility] OR;  Service: Open Heart Surgery;;   CANNULATION FOR ECMO (EXTRACORPOREAL MEMBRANE OXYGENATION) N/A 10/30/2019   Procedure: DECANNULATION OF ECMO (EXTRACORPOREAL MEMBRANE OXYGENATION);  Surgeon: Donata Clay, Theron Arista, MD;  Location: [facility] OR;  Service: Open Heart Surgery;  Laterality: N/A;  Pump standby   CHEST EXPLORATION  10/25/2019   Procedure: Chest Exploration for repair of coronary perforation (repair of LAD);  Surgeon: Donata Clay, Theron Arista, MD;  Location: [facility] OR;  Service: Open Heart Surgery;;   CORONARY CTO INTERVENTION N/A 10/25/2019   Procedure: CORONARY CTO INTERVENTION;  Surgeon: Swaziland, Peter M, MD;  Location: [facility] INVASIVE CV LAB;  Service: Cardiovascular;  Laterality: N/A;   CORONARY STENT INTERVENTION N/A 10/17/2019   Procedure: CORONARY STENT INTERVENTION;  Surgeon: Tonny Bollman, MD;  Location: [facility] INVASIVE CV LAB;  Service:  Cardiovascular;  Laterality: N/A;   LEFT HEART CATH AND CORONARY ANGIOGRAPHY N/A 10/17/2019   Procedure: LEFT HEART CATH AND CORONARY ANGIOGRAPHY;  Surgeon: Tonny Bollman, MD;  Location: [facility] INVASIVE CV LAB;  Service: Cardiovascular;  Laterality: N/A;   MEDIASTERNOTOMY  10/25/2019   Procedure: Median Sternotomy.;  Surgeon: Kerin Perna, MD;  Location: [facility] OR;  Service: Open Heart Surgery;;   PLACEMENT OF IMPELLA LEFT VENTRICULAR ASSIST DEVICE  10/30/2019   Procedure: Placement Of Impella Left Ventricular Assist Device 5.5 USING HEMASHIELD PLATINUM GRAFT;  Surgeon: Kerin Perna, MD;  Location: [facility] OR;  Service: Open Heart Surgery;;  graft placed into aorta   REMOVAL OF IMPELLA LEFT VENTRICULAR ASSIST DEVICE N/A 11/03/2019   Procedure: REMOVAL OF IMPELLA 5.5 LEFT VENTRICULAR ASSIST DEVICE;  Surgeon: Kerin Perna, MD;  Location: [facility] OR;  Service: Open Heart Surgery;  Laterality: N/A;   STERNAL CLOSURE  N/A 10/30/2019   Procedure: Sternal Closure;  Surgeon: Kerin Perna, MD;  Location: [facility] OR;  Service: Open Heart Surgery;  Laterality: N/A;   STERNAL WIRES REMOVAL N/A 05/14/2020   Procedure: STERNAL WIRES REMOVAL;  Surgeon: Kerin Perna, MD;  Location: [facility] OR;  Service: Thoracic;  Laterality: N/A;   TEE WITHOUT CARDIOVERSION N/A 10/25/2019   Procedure: TRANSESOPHAGEAL ECHOCARDIOGRAM (TEE);  Surgeon: Donata Clay, Theron Arista, MD;  Location: [facility] OR;  Service: Open Heart Surgery;  Laterality: N/A;   TEE WITHOUT CARDIOVERSION N/A 10/30/2019   Procedure: TRANSESOPHAGEAL ECHOCARDIOGRAM (TEE);  Surgeon: Donata Clay, Theron Arista, MD;  Location: [facility] OR;  Service: Open Heart Surgery;  Laterality: N/A;   TEE WITHOUT CARDIOVERSION N/A 11/03/2019   Procedure: TRANSESOPHAGEAL ECHOCARDIOGRAM (TEE);  Surgeon: Donata Clay, Theron Arista, MD;  Location: [facility] OR;  Service: Open Heart Surgery;  Laterality: N/A;   Past Medical History:  Diagnosis Date   Alcohol abuse    Anxiety    CAD (coronary artery disease)    Cardiac arrest (HCC)    Cardiogenic shock (HCC)    CHF (congestive heart failure) (HCC)    Cough    Dysrhythmia    GERD (gastroesophageal reflux disease)    HFrEF (heart failure with reduced ejection fraction) (HCC)    Hypertension    Hypertriglyceridemia    Ischemic cardiomyopathy    Right atrial thrombus    Tobacco abuse    Ht 5\' 8"  (1.727 m)   BMI 27.10 kg/m   Opioid Risk Score:   Fall Risk Score:  `1  Depression screen [facility] 2/9     12/19/2021    2:07 PM 11/18/2021    9:13 AM 10/21/2021    3:23 PM 09/19/2021    2:28 PM 08/19/2021    9:49 AM 06/26/2021    3:35 PM 05/02/2021    3:52 PM  Depression screen PHQ 2/9  Decreased Interest 0 0 0 0 0 0 0  Down, Depressed, Hopeless 0 0 0 0 0 0 0  PHQ - 2 Score 0 0 0 0 0 0 0    Review of Systems  Musculoskeletal:  Positive for back pain.       Pain in both knees       Objective:   Physical Exam Vitals and nursing note reviewed.  Constitutional:      Appearance:  Normal appearance.  Cardiovascular:     Rate and Rhythm: Normal rate and regular rhythm.     Pulses: Normal pulses.     Heart sounds: Normal heart sounds.  Pulmonary:     Effort: Pulmonary effort  is normal.     Breath sounds: Normal breath sounds.  Musculoskeletal:     Cervical back: Normal range of motion and neck supple.     Comments: Normal Muscle Bulk and Muscle Testing Reveals:  Upper Extremities: Full ROM and Muscle Strength 5/5  Lumbar Hypersensitivity Lower Extremities: Full ROM and Muscle Strength 5/5 Arises from Table with ease Narrow Based  Gait     Skin:    General: Skin is warm and dry.  Neurological:     Mental Status: He is alert and oriented to person, place, and time.  Psychiatric:        Mood and Affect: Mood normal.        Behavior: Behavior normal.         Assessment & Plan:  1. Left Shoulder Pain: No complaints today. Continue HEP as Tolerated. Continue current medication regimen. Continue to monitor. 12/19/2021 2.Left Greater Trochanter Bursitis: No complaints today. S/P Left Hip Injection with Dr Carlis Abbott on 09/03/2020, no relief noted he reports, Continue to alternate with Heat an Ice Therapy. Continue to monitor. 12/19/2021 3. Bilateral  Knees OA: Continue HEP as Tolerated. Continue current medication regimen. 12/19/2021 3. Chest Wall Discomfort/ Neuropathic Pain: No complaints today. Continue Gabapentin. Continue to Monitor. 12/19/2021 4.Chronic Low Back Pain without sciatica: Continue HEP and Continue current medication regimen. Continue to monitor. 12/19/2021 5. Chronic Pain Syndrome: : Rx Oxycodone 15 mg one tablet every 6 hours  as needed for pain #90. We will continue the opioid monitoring program, this consists of regular clinic visits, examinations, urine drug screen, pill counts as well as use of [place] Controlled Substance Reporting system. A 12 month History has been reviewed on the [place] Controlled Substance Reporting System on  12/19/2021.   F/U in 1 month

## 2021-12-24 ENCOUNTER — Telehealth: Payer: Self-pay

## 2021-12-24 NOTE — Telephone Encounter (Signed)
Patient called because he had a question about his medication. Informed patient that his medication was increased to 15 mg and to take it 3 times a day and may take 4 times a day if no relief per Jacalyn Lefevre, NP. Patient was confused because he only received 90 tablets. Informed patient that Riley Lam wanted him to send a Mychart message in 2 weeks to see if that helped his pain. Patient verbalized understanding

## 2022-01-05 ENCOUNTER — Telehealth: Payer: Self-pay | Admitting: Registered Nurse

## 2022-01-07 ENCOUNTER — Telehealth: Payer: Self-pay | Admitting: Registered Nurse

## 2022-01-07 MED ORDER — OXYCODONE HCL 15 MG PO TABS: 15.0000 mg | ORAL_TABLET | Freq: Four times a day (QID) | ORAL | 0 refills | Status: DC | PRN

## 2022-01-07 NOTE — Telephone Encounter (Signed)
PMP was Reviewed,  Oxycodone e-scribed today.  Mr. Down is awre via My-Chart message. Refill not due today.

## 2022-01-12 ENCOUNTER — Telehealth: Payer: Self-pay

## 2022-01-12 NOTE — Telephone Encounter (Signed)
PA for Oxycodone sent to insurance through CoverMyMeds 

## 2022-01-12 NOTE — Telephone Encounter (Signed)
Your PA request has been approved. Approval 01/12/22-07/11/22

## 2022-02-02 ENCOUNTER — Ambulatory Visit: Payer: BC Managed Care – PPO | Admitting: Physical Medicine and Rehabilitation

## 2022-02-02 ENCOUNTER — Encounter: Payer: 59 | Attending: Physical Medicine and Rehabilitation | Admitting: Physical Medicine and Rehabilitation

## 2022-02-02 VITALS — BP 132/70 | HR 70 | Ht 68.0 in | Wt 178.0 lb

## 2022-02-02 DIAGNOSIS — M5442 Lumbago with sciatica, left side: Secondary | ICD-10-CM | POA: Insufficient documentation

## 2022-02-02 DIAGNOSIS — M5441 Lumbago with sciatica, right side: Secondary | ICD-10-CM | POA: Diagnosis not present

## 2022-02-02 DIAGNOSIS — M170 Bilateral primary osteoarthritis of knee: Secondary | ICD-10-CM | POA: Diagnosis not present

## 2022-02-02 DIAGNOSIS — G8929 Other chronic pain: Secondary | ICD-10-CM | POA: Diagnosis not present

## 2022-02-02 MED ORDER — GABAPENTIN 800 MG PO TABS
800.0000 mg | ORAL_TABLET | Freq: Three times a day (TID) | ORAL | 3 refills | Status: DC
Start: 2022-02-02 — End: 2022-02-02

## 2022-02-02 MED ORDER — OXYCODONE HCL 15 MG PO TABS: 15.0000 mg | ORAL_TABLET | Freq: Four times a day (QID) | ORAL | 0 refills | Status: DC | PRN

## 2022-02-02 MED ORDER — GABAPENTIN 800 MG PO TABS: 800.0000 mg | ORAL_TABLET | Freq: Three times a day (TID) | ORAL | 3 refills | Status: DC

## 2022-02-02 NOTE — Patient Instructions (Signed)
Etracorporeal shockwave therapy

## 2022-02-02 NOTE — Progress Notes (Signed)
Subjective:    Patient ID: William Crawford, male    DOB: June 30, 1970, 51 y.o.   MRN: [id number]  HPI:   William Crawford is a 51 y.o. male who presents for follow-up of chronic low back pain, bilateral knee pain, chest pain, trigger finger, testosterone deficiency, and neck pain.   -currently pain is worse in both knees and his low back. He cannot say whether the testosterone supplement helps.  -he asks whether he should work with a Land. -has affected his ability to work, but he pushes through, but he has been wondering whether to pursue a different job. He has been working at his current job for a long time and it is hard on his back and knees. He does feel that asking for accommodations- particularly less times on his knees would help. He does use knee pads to support his knees.  -he wanted to pursue the Qutenza patch but it is not covered by his new insurance plan. -back pain has been worsening -he would benefit from a letter with work restrictions -he would like to pursue MRI, has not yet heard about this.  -he takes Gabapentin. He takes 800mg  TID. He thinks this helps with his opioid.  -has been using  electrical stimulator and that really helps -he asks what else he can take during the day  -His pain medication does not last as long as he would like,  -some days his pain is worse.  -He gets electric type pain down the legs almost every day more down left side than right.  -he asks if he can use hemp seed oil -he discussed with his cardiologist decreasing his atorvastatin dose and it has been decreased to 20mg . He has not had any improvements in pain since this change, if fact pain has worsened -he had a busy day yesterday working on his parents' as they have an upcoming drive to and he wanted to make sure it was safe for them to drive their car. He feels that this may have contributed to his worsening pain -been ok -he would like to know his vitamin D  and testosterone level results and discuss treatment for his trigger finger -qutenza has helped minimize his chest pain, it is now gone. -had a death in the family- uncle had a pneumonia. He lives in [place]. He died before he could leave work. Her aunt is taking this pretty hard. He was really close to him. They used to talk 2-3 times per week.  -The knee pain is located just under the knee cap and extends medially to laterally -He is feeling much better this week and has been able to work, but still having significant knee and low back pain -He was unable to work today due to pain in both back and knees -Also has chest pain at times- sharp and lasts three minutes when he has it.  -Pain has grown excruciating but he has been able to return to work as he needs to financially. -Discussed results of his bilateral knee MRI, his neck XR, and his back XR -He continues to have chest pain and had recent follow-up with cardiology. Would still like to try the Qutenza patch -the Cymbalta did not help.  -he requests if oxycodone can be increased to 6 tablets per day.  -does not note radiation of pain into legs, but legs have given out dur to pain causing near fall -he does not note loss of bowel or bladder  function -does note constipation -also having shooting pain down both arms into pinky fingers -discussed that bilateral knee pain and back pain could potentially be related to opioid-induced testosterone deficiency and he is agreeable to checking testosterone levels -he is also interested in ortho follow-up for his knee pain -he is feeling better today and was able to go to work -had burning pain over the weekend from the Qutenza that is now gone. Forgot to use the cooling gel. -he has never had PSA checked, discussed that result is 0.4 which is low, so we could trial testosterone patch given his deficiency and symptoms if he would like. He is agreeable and I have sent to his pharmacy.   Prior history:  He states his pain is located in his left shoulder, lower back pain, left hip and bilateral knee pain. He was in contact with William Crawford regarding his increase intensity of pain, per William Crawford note his oxycodone will be increased to 5 times a day as needed for pain. Mr. William Crawford understanding.  He rates his pain 4. His current exercise regime is walking and performing stretching exercises.  William Crawford Morphine equivalent is 60.00  MME.   Last UDs was Performed on 08/28/2020, it was consistent.   He is having a good summer.   He fell down and hit his right ankle and got a big bruise- not hurting too bad.   He had a good time with his grandson riding rolling coasters at [employer]  His chest pain returned and it feels sore and he plans to f/u with his surgeon regarding the pain. It feels sharp, hot, like a burning He takes Gabapentin 600mg - but that doesn't help unless used with the oxycodone.  Currently his pain is worse in bilateral knees- it is worst when he kneels, climbs ladders. He has a supportive community at work which allow him to limit his kneeling and he has been using pads when he kneels. His oxycodone is now only lasting him about 2.5 hours after he takes it and he has been taking them 5 times per day. He asks if there is anything stronger he can take. He has tried Voltaren gel in the past and its effect usually started an hour after application.  -got compression sleeves to apply to his knees and has been able to work -at times his knee still buckles and this is painful -he feels he is getting used to the pain with walking but as far as climbing he feels it a lot more -he got a steroid injection by orthopedics and did find that it helped but did not last very long.  -he has been trying to eat more foods that reduce inflammation  Low testosterone:  -he bought an over the counter supplement since the patch was not covered by insurance -takes magnesium, B1, zinc, vitamin  D.      Pain Inventory Average Pain 7 Pain Right Now 9 My pain is constant, sharp, burning, stabbing, and aching  In the last 24 hours, has pain interfered with the following? General activity 8 Relation with others 6 Enjoyment of life 6 What TIME of day is your pain at its worst? morning , daytime, and evening Sleep (in general) Fair  Pain is worse with: walking, bending, sitting, and some activites Pain improves with: medication Relief from Meds: 8  Family History  Problem Relation Age of Onset   Heart attack Father    Heart disease Father    Heart  attack Paternal Grandfather    Heart disease Paternal Grandfather    Heart attack Paternal Uncle    Heart disease Paternal Uncle    Heart attack Paternal Uncle    Heart disease Paternal Uncle    Social History   Socioeconomic History   Marital status: Single    Spouse name: Not on file   Number of children: Not on file   Years of education: Not on file   Highest education level: Not on file  Occupational History   Not on file  Tobacco Use   Smoking status: Former    Types: Cigarettes    Quit date: 10/23/2019    Years since quitting: 2.2   Smokeless tobacco: Never  Vaping Use   Vaping Use: Never used  Substance and Sexual Activity   Alcohol use: Not Currently    Comment: QUIT  IN MAY 16109   Drug use: No   Sexual activity: Not on file  Other Topics Concern   Not on file  Social History Narrative   Not on file   Social Determinants of Health   Financial Resource Strain: Not on file  Food Insecurity: Not on file  Transportation Needs: Not on file  Physical Activity: Not on file  Stress: Not on file  Social Connections: Not on file   Past Surgical History:  Procedure Laterality Date   APPLICATION OF WOUND VAC  10/25/2019   Procedure: Application Of Wound Vac;  Surgeon: Kerin Perna, MD;  Location: [facility] OR;  Service: Open Heart Surgery;;   CANNULATION FOR ECMO (EXTRACORPOREAL MEMBRANE OXYGENATION)   10/25/2019   Procedure: Cannulation For Ecmo (Extracorporeal Membrane Oxygenation) @ 1531;  Surgeon: Donata Clay, Theron Arista, MD;  Location: [facility] OR;  Service: Open Heart Surgery;;   CANNULATION FOR ECMO (EXTRACORPOREAL MEMBRANE OXYGENATION) N/A 10/30/2019   Procedure: DECANNULATION OF ECMO (EXTRACORPOREAL MEMBRANE OXYGENATION);  Surgeon: Donata Clay, Theron Arista, MD;  Location: [facility] OR;  Service: Open Heart Surgery;  Laterality: N/A;  Pump standby   CHEST EXPLORATION  10/25/2019   Procedure: Chest Exploration for repair of coronary perforation (repair of LAD);  Surgeon: Donata Clay, Theron Arista, MD;  Location: [facility] OR;  Service: Open Heart Surgery;;   CORONARY CTO INTERVENTION N/A 10/25/2019   Procedure: CORONARY CTO INTERVENTION;  Surgeon: Swaziland, Peter M, MD;  Location: [facility] INVASIVE CV LAB;  Service: Cardiovascular;  Laterality: N/A;   CORONARY STENT INTERVENTION N/A 10/17/2019   Procedure: CORONARY STENT INTERVENTION;  Surgeon: Tonny Bollman, MD;  Location: [facility] INVASIVE CV LAB;  Service: Cardiovascular;  Laterality: N/A;   LEFT HEART CATH AND CORONARY ANGIOGRAPHY N/A 10/17/2019   Procedure: LEFT HEART CATH AND CORONARY ANGIOGRAPHY;  Surgeon: Tonny Bollman, MD;  Location: [facility] INVASIVE CV LAB;  Service: Cardiovascular;  Laterality: N/A;   MEDIASTERNOTOMY  10/25/2019   Procedure: Median Sternotomy.;  Surgeon: Kerin Perna, MD;  Location: [facility] OR;  Service: Open Heart Surgery;;   PLACEMENT OF IMPELLA LEFT VENTRICULAR ASSIST DEVICE  10/30/2019   Procedure: Placement Of Impella Left Ventricular Assist Device 5.5 USING HEMASHIELD PLATINUM GRAFT;  Surgeon: Kerin Perna, MD;  Location: [facility] OR;  Service: Open Heart Surgery;;  graft placed into aorta   REMOVAL OF IMPELLA LEFT VENTRICULAR ASSIST DEVICE N/A 11/03/2019   Procedure: REMOVAL OF IMPELLA 5.5 LEFT VENTRICULAR ASSIST DEVICE;  Surgeon: Kerin Perna, MD;  Location: [facility] OR;  Service: Open Heart Surgery;  Laterality: N/A;   STERNAL CLOSURE N/A 10/30/2019   Procedure: Sternal  Closure;  Surgeon: Donata Clay,  Theron Arista, MD;  Location: [facility] OR;  Service: Open Heart Surgery;  Laterality: N/A;   STERNAL WIRES REMOVAL N/A 05/14/2020   Procedure: STERNAL WIRES REMOVAL;  Surgeon: Kerin Perna, MD;  Location: [facility] OR;  Service: Thoracic;  Laterality: N/A;   TEE WITHOUT CARDIOVERSION N/A 10/25/2019   Procedure: TRANSESOPHAGEAL ECHOCARDIOGRAM (TEE);  Surgeon: Donata Clay, Theron Arista, MD;  Location: [facility] OR;  Service: Open Heart Surgery;  Laterality: N/A;   TEE WITHOUT CARDIOVERSION N/A 10/30/2019   Procedure: TRANSESOPHAGEAL ECHOCARDIOGRAM (TEE);  Surgeon: Donata Clay, Theron Arista, MD;  Location: [facility] OR;  Service: Open Heart Surgery;  Laterality: N/A;   TEE WITHOUT CARDIOVERSION N/A 11/03/2019   Procedure: TRANSESOPHAGEAL ECHOCARDIOGRAM (TEE);  Surgeon: Donata Clay, Theron Arista, MD;  Location: [facility] OR;  Service: Open Heart Surgery;  Laterality: N/A;   Past Surgical History:  Procedure Laterality Date   APPLICATION OF WOUND VAC  10/25/2019   Procedure: Application Of Wound Vac;  Surgeon: Kerin Perna, MD;  Location: [facility] OR;  Service: Open Heart Surgery;;   CANNULATION FOR ECMO (EXTRACORPOREAL MEMBRANE OXYGENATION)  10/25/2019   Procedure: Cannulation For Ecmo (Extracorporeal Membrane Oxygenation) @ 1531;  Surgeon: Kerin Perna, MD;  Location: [facility] OR;  Service: Open Heart Surgery;;   CANNULATION FOR ECMO (EXTRACORPOREAL MEMBRANE OXYGENATION) N/A 10/30/2019   Procedure: DECANNULATION OF ECMO (EXTRACORPOREAL MEMBRANE OXYGENATION);  Surgeon: Donata Clay, Theron Arista, MD;  Location: [facility] OR;  Service: Open Heart Surgery;  Laterality: N/A;  Pump standby   CHEST EXPLORATION  10/25/2019   Procedure: Chest Exploration for repair of coronary perforation (repair of LAD);  Surgeon: Donata Clay, Theron Arista, MD;  Location: [facility] OR;  Service: Open Heart Surgery;;   CORONARY CTO INTERVENTION N/A 10/25/2019   Procedure: CORONARY CTO INTERVENTION;  Surgeon: Swaziland, Peter M, MD;  Location: [facility] INVASIVE CV LAB;  Service: Cardiovascular;  Laterality: N/A;    CORONARY STENT INTERVENTION N/A 10/17/2019   Procedure: CORONARY STENT INTERVENTION;  Surgeon: Tonny Bollman, MD;  Location: [facility] INVASIVE CV LAB;  Service: Cardiovascular;  Laterality: N/A;   LEFT HEART CATH AND CORONARY ANGIOGRAPHY N/A 10/17/2019   Procedure: LEFT HEART CATH AND CORONARY ANGIOGRAPHY;  Surgeon: Tonny Bollman, MD;  Location: [facility] INVASIVE CV LAB;  Service: Cardiovascular;  Laterality: N/A;   MEDIASTERNOTOMY  10/25/2019   Procedure: Median Sternotomy.;  Surgeon: Kerin Perna, MD;  Location: [facility] OR;  Service: Open Heart Surgery;;   PLACEMENT OF IMPELLA LEFT VENTRICULAR ASSIST DEVICE  10/30/2019   Procedure: Placement Of Impella Left Ventricular Assist Device 5.5 USING HEMASHIELD PLATINUM GRAFT;  Surgeon: Kerin Perna, MD;  Location: [facility] OR;  Service: Open Heart Surgery;;  graft placed into aorta   REMOVAL OF IMPELLA LEFT VENTRICULAR ASSIST DEVICE N/A 11/03/2019   Procedure: REMOVAL OF IMPELLA 5.5 LEFT VENTRICULAR ASSIST DEVICE;  Surgeon: Kerin Perna, MD;  Location: [facility] OR;  Service: Open Heart Surgery;  Laterality: N/A;   STERNAL CLOSURE N/A 10/30/2019   Procedure: Sternal Closure;  Surgeon: Kerin Perna, MD;  Location: [facility] OR;  Service: Open Heart Surgery;  Laterality: N/A;   STERNAL WIRES REMOVAL N/A 05/14/2020   Procedure: STERNAL WIRES REMOVAL;  Surgeon: Kerin Perna, MD;  Location: [facility] OR;  Service: Thoracic;  Laterality: N/A;   TEE WITHOUT CARDIOVERSION N/A 10/25/2019   Procedure: TRANSESOPHAGEAL ECHOCARDIOGRAM (TEE);  Surgeon: Donata Clay, Theron Arista, MD;  Location: [facility] OR;  Service: Open Heart Surgery;  Laterality: N/A;   TEE WITHOUT CARDIOVERSION N/A 10/30/2019   Procedure: TRANSESOPHAGEAL ECHOCARDIOGRAM (TEE);  Surgeon: Zenaida Niece  Greg Cutter, MD;  Location: [facility] OR;  Service: Open Heart Surgery;  Laterality: N/A;   TEE WITHOUT CARDIOVERSION N/A 11/03/2019   Procedure: TRANSESOPHAGEAL ECHOCARDIOGRAM (TEE);  Surgeon: Donata Clay, Theron Arista, MD;  Location: [facility] OR;  Service: Open Heart  Surgery;  Laterality: N/A;   Past Medical History:  Diagnosis Date   Alcohol abuse    Anxiety    CAD (coronary artery disease)    Cardiac arrest (HCC)    Cardiogenic shock (HCC)    CHF (congestive heart failure) (HCC)    Cough    Dysrhythmia    GERD (gastroesophageal reflux disease)    HFrEF (heart failure with reduced ejection fraction) (HCC)    Hypertension    Hypertriglyceridemia    Ischemic cardiomyopathy    Right atrial thrombus    Tobacco abuse    There were no vitals taken for this visit.  Opioid Risk Score:   Fall Risk Score:  `1  Depression screen [facility] 2/9     12/19/2021    2:07 PM 11/18/2021    9:13 AM 10/21/2021    3:23 PM 09/19/2021    2:28 PM 08/19/2021    9:49 AM 06/26/2021    3:35 PM 05/02/2021    3:52 PM  Depression screen [facility] 2/9  Decreased Interest 0 0 0 0 0 0 0  Down, Depressed, Hopeless 0 0 0 0 0 0 0  PHQ - 2 Score 0 0 0 0 0 0 0     Review of Systems  Constitutional: Negative.   HENT: Negative.    Eyes: Negative.   Respiratory: Negative.    Cardiovascular: Negative.   Gastrointestinal: Negative.   Endocrine: Negative.   Genitourinary: Negative.   Musculoskeletal:  Positive for arthralgias, back pain and myalgias.  Skin: Negative.   Allergic/Immunologic: Negative.   Neurological:        Tingling  Hematological:  Bruises/bleeds easily.       Plavix  Psychiatric/Behavioral: Negative.    All other systems reviewed and are negative.      Objective:    Gen: no distress, normal appearing HEENT: oral mucosa pink and moist, NCAT Cardio: Reg rate Chest: normal effort, normal rate of breathing Abd: soft, non-distended Ext: no edema Psych: pleasant, normal affect Skin: intact Neuro: TTP low back and knees, painful through flexion and extension range of motion testing.       Assessment & Plan:  1. Left Shoulder Pain: Continie HEP as Tolerated. Continue current medication regimen. Continue to monitor. 11/22/2020  2.Left Greater Trochanter  Bursitis: S/P Left Hip Injection with William Crawford on 09/03/2020, no relief noted he reports, Continue to alternate with Heat an Ice Therapy. Continue to monitor. 11/22/2020  3. Bilateral  Knees chondral defects: Continue HEP as Tolerated. Continue current medication regimen. 11/22/2020. Discussed results of bilateral knee MRIs which showed chondral deficits. Advised application of voltaren gel. Advised icing in evenings. Did not benefit from prior steroid taper. Referred to ortho for eval for arthroscopic debridement.  -discussed that pain could be secondary to opioid induced testoserone deficiency -testosterone level checked and it is is low. -Discussed the risks and benefits of testosterone replacement therapy and he is interested in trying this  -PSA should be monitored while using testosterone replacement therapy as there is an increased risk of prostate cancer.  -recommended Wobenxyme -refilled oxycodone and gabapentin.  -Discussed following foods that may reduce pain: 1) Ginger (especially studied for arthritis)- reduce leukotriene production to decrease inflammation 2) Blueberries- high in phytonutrients that decrease  inflammation 3) Salmon- marine omega-3s reduce joint swelling and pain 4) Pumpkin seeds- reduce inflammation 5) dark chocolate- reduces inflammation 6) turmeric- reduces inflammation 7) tart cherries - reduce pain and stiffness 8) extra virgin olive oil - its compound olecanthal helps to block prostaglandins  9) chili peppers- can be eaten or applied topically via capsaicin 10) mint- helpful for headache, muscle aches, joint pain, and itching 11) garlic- reduces inflammation  Link to further information on diet for chronic pain: http://www.bray.com/   3. Chest Wall Discomfort/ Neuropathic Pain: Continue Gabapentin. Continue to Monitor. 11/22/2020. Prescribed lidocaine patch.  -Cymbalta  daily  prescribed -Stop Topamax.  -continue Percocet -Discussed Qutenza as an option for neuropathic pain control. Discussed that this is a capsaicin patch, stronger than capsaicin cream. Discussed that it is currently approved for diabetic peripheral neuropathy and post-herpetic neuralgia, but that it has also shown benefit in treating other forms of neuropathy. Provided patient with link to site to learn more about the patch: https://www.clark.biz/. Discussed that the patch would be placed in office and benefits usually last 3 months. Discussed that unintended exposure to capsaicin can cause severe irritation of eyes, mucous membranes, respiratory tract, and skin, but that Qutenza is a local treatment and does not have the systemic side effects of other nerve medications. Discussed that there may be pain, itching, erythema, and decreased sensory function associated with the application of Qutenza. Side effects usually subside within 1 week. A cold pack of analgesic medications can help with these side effects. Blood pressure can also be increased due to pain associated with administration of the patch.    -4. Chronic Pain Syndrome: :  continue Oxycodone 10/325 mg 6 times a day as needed for pain #180. We will continue the opioid monitoring program, this consists of regular clinic visits, examinations, urine drug screen, pill counts as well as use of [place] Controlled Substance Reporting system. A 12 month History has been reviewed on the [place] Controlled Substance Reporting System on 11/22/2020. Discussed risks and benefits of opioid medications.  -Provided with a pain relief journal and discussed that it contains foods and lifestyle tips to naturally help to improve pain. Discussed that these lifestyle strategies are also very good for health unlike some medications which can have negative side effects. Discussed that the act of keeping a journal can be therapeutic and helpful to realize  patterns what helps to trigger and alleviate pain.   -Discussed following foods that may reduce pain: 1) Ginger (especially studied for arthritis)- reduce leukotriene production to decrease inflammation 2) Blueberries- high in phytonutrients that decrease inflammation 3) Salmon- marine omega-3s reduce joint swelling and pain 4) Pumpkin seeds- reduce inflammation 5) dark chocolate- reduces inflammation 6) turmeric- reduces inflammation 7) tart cherries - reduce pain and stiffness 8) extra virgin olive oil - its compound olecanthal helps to block prostaglandins  9) chili peppers- can be eaten or applied topically via capsaicin 10) mint- helpful for headache, muscle aches, joint pain, and itching 11) garlic- reduces inflammation  Link to further information on diet for chronic pain: http://www.bray.com/   5) Insomnia: continue trazodone  HS  6) Low back pain: -Discussed results of MRI which shows disc degeneration, spinal stenosis, and bone marrow edema -discussed risks and benefits of extracorporeal shockwave therapy and will plan for this next visit.  -Given worsening pain and radiation of pain into legs intermittently with occasional weakness, new MRI ordered, will follow-up on this with April  -discussed that hemp products not recommended while  receiving opioids as may show up as THC in his urine sample -discussed that muscle and ligament pathology can also cause severe pain at times -encouraged continued exercise and working to send blood flow to injured area and promote healing -Provided with a pain relief journal and discussed that it contains foods and lifestyle tips to naturally help to improve pain. Discussed that these lifestyle strategies are also very good for health unlike some medications which can have negative side effects. Discussed that the act of keeping a journal can be therapeutic and helpful to  realize patterns what helps to trigger and alleviate pain.   -Wobenzyme -Discussed response to decrease in atorvastatin. Discussed that cholesterol is important for muscle function and lowering cholesterol levels too much is associated with joint pain in some patients. Discussed that some patients feel relief as soon as decreasing statin medications, whereas for others it takes a longer time to see if pain improves or not. Recommended repeating his lipid panel prior to when he is due for his next refill so that we make sure his LDL does not increase above 100.   7) Cervical radiuculitis: -Discussed that cervical XR showed disc degeneration  8) Testosterone deficiency Discussed that testosterone level has improved with his exercise and dietary changes  9) Trigger finger, left -recommended brace -if this does not help, recommend orthopedic follow-up for trigger point injection  10) Prostate cancer screening: -PSA check today.   11) HLD: -reviewed last lipid panel with him -discussed checking lipid panel with cardiologist.  -recommend decreasing atorvastatin to 20mg , but should discuss with cardiologist.   12. Suboptimal vitamin D -prescribed 50,000U ergocalciferol once per week for 7 weeks -now can continue over the counter D3  13) Trigger finger -sent him a picture of brace he can purchase on [place] -referred to surgery for steroid injection if brace fails to help  14) Insomnia -prescribed amitriptyline 10mg  HS    13 minutes spent in discussion of lumbar MRI which shows disc degeneration and spinal stenosis without nerve impingement, discussed response to opioids, discussed cardiology appointment today

## 2022-02-26 ENCOUNTER — Other Ambulatory Visit: Payer: Self-pay

## 2022-02-26 MED ORDER — AMITRIPTYLINE HCL 10 MG PO TABS: 10.0000 mg | ORAL_TABLET | Freq: Every day | ORAL | 3 refills | Status: DC

## 2022-03-04 ENCOUNTER — Other Ambulatory Visit: Payer: Self-pay

## 2022-03-04 ENCOUNTER — Other Ambulatory Visit (HOSPITAL_COMMUNITY): Payer: Self-pay

## 2022-03-04 MED ORDER — AMITRIPTYLINE HCL 10 MG PO TABS: 10.0000 mg | ORAL_TABLET | Freq: Every day | ORAL | 3 refills | Status: DC

## 2022-03-04 NOTE — Telephone Encounter (Addendum)
Pharmacy location changed. [facility]. Per [facility].   [facility] refills have been cancelled today.

## 2022-03-09 ENCOUNTER — Telehealth: Payer: Self-pay | Admitting: Registered Nurse

## 2022-03-09 MED ORDER — OXYCODONE HCL 15 MG PO TABS: 15.0000 mg | ORAL_TABLET | Freq: Four times a day (QID) | ORAL | 0 refills | Status: DC | PRN

## 2022-03-09 NOTE — Telephone Encounter (Signed)
PMP was Reviewed.  Oxycodone e-scribed today.  Call placed to William Crawford regarding the above, he verbalizes understanding. He was also instructed to call office three days prior to refill.

## 2022-03-09 NOTE — Telephone Encounter (Signed)
Patient called asking for refill on Oxycodone. He will be out 03/10/22.

## 2022-03-27 ENCOUNTER — Encounter: Payer: 59 | Attending: Physical Medicine and Rehabilitation | Admitting: Physical Medicine and Rehabilitation

## 2022-03-27 DIAGNOSIS — Z79899 Other long term (current) drug therapy: Secondary | ICD-10-CM | POA: Insufficient documentation

## 2022-03-27 DIAGNOSIS — Z5181 Encounter for therapeutic drug level monitoring: Secondary | ICD-10-CM | POA: Insufficient documentation

## 2022-03-27 DIAGNOSIS — M170 Bilateral primary osteoarthritis of knee: Secondary | ICD-10-CM | POA: Insufficient documentation

## 2022-03-27 DIAGNOSIS — R0789 Other chest pain: Secondary | ICD-10-CM | POA: Diagnosis not present

## 2022-03-27 DIAGNOSIS — G894 Chronic pain syndrome: Secondary | ICD-10-CM | POA: Insufficient documentation

## 2022-03-27 MED ORDER — NITROGLYCERIN 0.4 MG SL SUBL: 0.4000 mg | SUBLINGUAL_TABLET | SUBLINGUAL | 12 refills | Status: AC | PRN

## 2022-03-27 NOTE — Progress Notes (Signed)
Subjective:    Patient ID: William Crawford, male    DOB: 04/06/1971, 51 y.o.   MRN: [id number]  HPI: An audio/video tele-health visit is felt to be the most appropriate encounter for this patient at this time. This is a follow up tele-visit via phone. The patient is at home. MD is at office. Prior to scheduling this appointment, our staff discussed the limitations of evaluation and management by telemedicine and the availability of in-person appointments. The patient expressed understanding and agreed to proceed.    William Crawford is a 51 y.o. male who presents for f/u of chronic low back pain, bilateral knee pain, chest pain, trigger finger, testosterone deficiency, and neck pain.   1) Chest pain -after several months of no chest pain, patient reports a recurrence of chest pain this week -he had 3 episodes this week which felt like sharp, shooting pains -these episodes lasted 30-45 seconds -previously his chest pain had resolved after Qutenza administration -he asks why it may have started again.  -currently pain is worse in both knees and his low back. He cannot say whether the testosterone supplement helps.  -he asks whether he should work with a Land. -has affected his ability to work, but he pushes through, but he has been wondering whether to pursue a different job. He has been working at his current job for a long time and it is hard on his back and knees. He does feel that asking for accommodations- particularly less times on his knees would help. He does use knee pads to support his knees.  -he wanted to pursue the Qutenza patch but it is not covered by his new insurance plan. -back pain has been worsening -he would benefit from a letter with work restrictions -he would like to pursue MRI, has not yet heard about this.  -he takes Gabapentin. He takes 800mg  TID. He thinks this helps with his opioid.  -has been using  electrical stimulator and that really helps -he asks  what else he can take during the day  -His pain medication does not last as long as he would like,  -some days his pain is worse.  -He gets electric type pain down the legs almost every day more down left side than right.  -he asks if he can use hemp seed oil -he discussed with his cardiologist decreasing his atorvastatin dose and it has been decreased to 20mg . 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 He feels that this may have contributed to his worsening pain -been ok -he would like to know his vitamin D and testosterone level results and discuss treatment for his trigger finger -qutenza has helped minimize his chest pain, it is now gone. -had a death in the family- uncle had a pneumonia. He lives in [place]. He died before he could leave work. Her aunt is taking this pretty hard. He was really close to him. They used to talk 2-3 times per week.  -The knee pain is located just under the knee cap and extends medially to laterally -He is feeling much better this week and has been able to work, but still having significant knee and low back pain -He was unable to work today due to pain  in both back and knees -Also has chest pain at times- sharp and lasts three minutes when he has it.  -Pain has grown excruciating but he has been able to return to work as he needs to financially. -Discussed results of his bilateral knee MRI, his neck XR, and his back XR -He continues to have chest pain and had recent follow-up with cardiology. Would still like to try the Qutenza patch -the Cymbalta did not help.  -he requests if oxycodone can be increased to 6 tablets per day.  -does not note radiation of pain into legs, but legs have given out dur to pain causing near fall -he does not note loss  of bowel or bladder function -does note constipation -also having shooting pain down both arms into pinky fingers -discussed that bilateral knee pain and back pain could potentially be related to opioid-induced testosterone deficiency and he is agreeable to checking testosterone levels -he is also interested in ortho follow-up for his knee pain -he is feeling better today and was able to go to work -had burning pain over the weekend from the Qutenza that is now gone. Forgot to use the cooling gel. -he has never had PSA checked, discussed that result is 0.4 which is low, so we could trial testosterone patch given his deficiency and symptoms if he would like. He is agreeable and I have sent to his pharmacy.   Prior history: He states his pain is located in his left shoulder, lower back pain, left hip and bilateral knee pain. He was in contact with Dr Carlis Abbott regarding his increase intensity of pain, per Dr Carlis Abbott note his oxycodone will be increased to 5 times a day as needed for pain. William Crawford understanding.  He rates his pain 4. His current exercise regime is walking and performing stretching exercises.  [name] Morphine equivalent is 60.00  MME.   Last UDs was Performed on 08/28/2020, it was consistent.   He is having a good summer.   He fell down and hit his right ankle and got a big bruise- not hurting too bad.   He had a good time with his grandson riding rolling coasters at [employer]  His chest pain returned and it feels sore and he plans to f/u with his surgeon regarding the pain. It feels sharp, hot, like a burning He takes Gabapentin 600mg - but that doesn't help unless used with the oxycodone.  Currently his pain is worse in bilateral knees- it is worst when he kneels, climbs ladders. He has a supportive community at work which allow him to limit his kneeling and he has been using pads when he kneels. His oxycodone is now only lasting him about 2.5 hours after he  takes it and he has been taking them 5 times per day. He asks if there is anything stronger he can take. He has tried Voltaren gel in the past and its effect usually started an hour after application.  -got compression sleeves to apply to his knees and has been able to work -at times his knee still buckles and this is painful -he feels he is getting used to the pain with walking but as far as climbing he feels it a lot more -he got a steroid injection by orthopedics and did find that it helped but did not last very long.  -he has been trying to eat more foods that reduce inflammation  Low testosterone:  -he bought an over the counter supplement since the  patch was not covered by insurance -takes magnesium, B1, zinc, vitamin D.      Pain Inventory Average Pain 7 Pain Right Now 9 My pain is constant, sharp, burning, stabbing, and aching  In the last 24 hours, has pain interfered with the following? General activity 8 Relation with others 6 Enjoyment of life 6 What TIME of day is your pain at its worst? morning , daytime, and evening Sleep (in general) Fair  Pain is worse with: walking, bending, sitting, and some activites Pain improves with: medication Relief from Meds: 8  Family History  Problem Relation Age of Onset   Heart attack Father    Heart disease Father    Heart attack Paternal Grandfather    Heart disease Paternal Grandfather    Heart attack Paternal Uncle    Heart disease Paternal Uncle    Heart attack Paternal Uncle    Heart disease Paternal Uncle    Social History   Socioeconomic History   Marital status: Single    Spouse name: Not on file   Number of children: Not on file   Years of education: Not on file   Highest education level: Not on file  Occupational History   Not on file  Tobacco Use   Smoking status: Former    Types: Cigarettes    Quit date: 10/23/2019    Years since quitting: 2.4   Smokeless tobacco: Never  Vaping Use   Vaping Use: Never  used  Substance and Sexual Activity   Alcohol use: Not Currently    Comment: QUIT  IN MAY [id number]   Drug use: No   Sexual activity: Not on file  Other Topics Concern   Not on file  Social History Narrative   Not on file   Social Determinants of Health   Financial Resource Strain: Not on file  Food Insecurity: Not on file  Transportation Needs: Not on file  Physical Activity: Not on file  Stress: Not on file  Social Connections: Not on file   Past Surgical History:  Procedure Laterality Date   APPLICATION OF WOUND VAC  10/25/2019   Procedure: Application Of Wound Vac;  Surgeon: Kerin Perna, MD;  Location: [facility] OR;  Service: Open Heart Surgery;;   CANNULATION FOR ECMO (EXTRACORPOREAL MEMBRANE OXYGENATION)  10/25/2019   Procedure: Cannulation For Ecmo (Extracorporeal Membrane Oxygenation) @ 1531;  Surgeon: Donata Clay, Theron Arista, MD;  Location: [facility] OR;  Service: Open Heart Surgery;;   CANNULATION FOR ECMO (EXTRACORPOREAL MEMBRANE OXYGENATION) N/A 10/30/2019   Procedure: DECANNULATION OF ECMO (EXTRACORPOREAL MEMBRANE OXYGENATION);  Surgeon: Donata Clay, Theron Arista, MD;  Location: [facility] OR;  Service: Open Heart Surgery;  Laterality: N/A;  Pump standby   CHEST EXPLORATION  10/25/2019   Procedure: Chest Exploration for repair of coronary perforation (repair of LAD);  Surgeon: Donata Clay, Theron Arista, MD;  Location: [facility] OR;  Service: Open Heart Surgery;;   CORONARY CTO INTERVENTION N/A 10/25/2019   Procedure: CORONARY CTO INTERVENTION;  Surgeon: Swaziland, Peter M, MD;  Location: [facility] INVASIVE CV LAB;  Service: Cardiovascular;  Laterality: N/A;   CORONARY STENT INTERVENTION N/A 10/17/2019   Procedure: CORONARY STENT INTERVENTION;  Surgeon: Tonny Bollman, MD;  Location: [facility] INVASIVE CV LAB;  Service: Cardiovascular;  Laterality: N/A;   LEFT HEART CATH AND CORONARY ANGIOGRAPHY N/A 10/17/2019   Procedure: LEFT HEART CATH AND CORONARY ANGIOGRAPHY;  Surgeon: Tonny Bollman, MD;  Location: [facility] INVASIVE CV LAB;  Service:  Cardiovascular;  Laterality: N/A;   MEDIASTERNOTOMY  10/25/2019  Procedure: Median Sternotomy.;  Surgeon: Kerin Perna, MD;  Location: [facility] OR;  Service: Open Heart Surgery;;   PLACEMENT OF IMPELLA LEFT VENTRICULAR ASSIST DEVICE  10/30/2019   Procedure: Placement Of Impella Left Ventricular Assist Device 5.5 USING HEMASHIELD PLATINUM GRAFT;  Surgeon: Kerin Perna, MD;  Location: [facility] OR;  Service: Open Heart Surgery;;  graft placed into aorta   REMOVAL OF IMPELLA LEFT VENTRICULAR ASSIST DEVICE N/A 11/03/2019   Procedure: REMOVAL OF IMPELLA 5.5 LEFT VENTRICULAR ASSIST DEVICE;  Surgeon: Kerin Perna, MD;  Location: [facility] OR;  Service: Open Heart Surgery;  Laterality: N/A;   STERNAL CLOSURE N/A 10/30/2019   Procedure: Sternal Closure;  Surgeon: Kerin Perna, MD;  Location: [facility] OR;  Service: Open Heart Surgery;  Laterality: N/A;   STERNAL WIRES REMOVAL N/A 05/14/2020   Procedure: STERNAL WIRES REMOVAL;  Surgeon: Kerin Perna, MD;  Location: [facility] OR;  Service: Thoracic;  Laterality: N/A;   TEE WITHOUT CARDIOVERSION N/A 10/25/2019   Procedure: TRANSESOPHAGEAL ECHOCARDIOGRAM (TEE);  Surgeon: Donata Clay, Theron Arista, MD;  Location: [facility] OR;  Service: Open Heart Surgery;  Laterality: N/A;   TEE WITHOUT CARDIOVERSION N/A 10/30/2019   Procedure: TRANSESOPHAGEAL ECHOCARDIOGRAM (TEE);  Surgeon: Donata Clay, Theron Arista, MD;  Location: [facility] OR;  Service: Open Heart Surgery;  Laterality: N/A;   TEE WITHOUT CARDIOVERSION N/A 11/03/2019   Procedure: TRANSESOPHAGEAL ECHOCARDIOGRAM (TEE);  Surgeon: Donata Clay, Theron Arista, MD;  Location: [facility] OR;  Service: Open Heart Surgery;  Laterality: N/A;   Past Surgical History:  Procedure Laterality Date   APPLICATION OF WOUND VAC  10/25/2019   Procedure: Application Of Wound Vac;  Surgeon: Kerin Perna, MD;  Location: [facility] OR;  Service: Open Heart Surgery;;   CANNULATION FOR ECMO (EXTRACORPOREAL MEMBRANE OXYGENATION)  10/25/2019   Procedure: Cannulation For Ecmo (Extracorporeal Membrane  Oxygenation) @ 1531;  Surgeon: Kerin Perna, MD;  Location: [facility] OR;  Service: Open Heart Surgery;;   CANNULATION FOR ECMO (EXTRACORPOREAL MEMBRANE OXYGENATION) N/A 10/30/2019   Procedure: DECANNULATION OF ECMO (EXTRACORPOREAL MEMBRANE OXYGENATION);  Surgeon: Donata Clay, Theron Arista, MD;  Location: [facility] OR;  Service: Open Heart Surgery;  Laterality: N/A;  Pump standby   CHEST EXPLORATION  10/25/2019   Procedure: Chest Exploration for repair of coronary perforation (repair of LAD);  Surgeon: Donata Clay, Theron Arista, MD;  Location: [facility] OR;  Service: Open Heart Surgery;;   CORONARY CTO INTERVENTION N/A 10/25/2019   Procedure: CORONARY CTO INTERVENTION;  Surgeon: Swaziland, Peter M, MD;  Location: [facility] INVASIVE CV LAB;  Service: Cardiovascular;  Laterality: N/A;   CORONARY STENT INTERVENTION N/A 10/17/2019   Procedure: CORONARY STENT INTERVENTION;  Surgeon: Tonny Bollman, MD;  Location: [facility] INVASIVE CV LAB;  Service: Cardiovascular;  Laterality: N/A;   LEFT HEART CATH AND CORONARY ANGIOGRAPHY N/A 10/17/2019   Procedure: LEFT HEART CATH AND CORONARY ANGIOGRAPHY;  Surgeon: Tonny Bollman, MD;  Location: [facility] INVASIVE CV LAB;  Service: Cardiovascular;  Laterality: N/A;   MEDIASTERNOTOMY  10/25/2019   Procedure: Median Sternotomy.;  Surgeon: Kerin Perna, MD;  Location: [facility] OR;  Service: Open Heart Surgery;;   PLACEMENT OF IMPELLA LEFT VENTRICULAR ASSIST DEVICE  10/30/2019   Procedure: Placement Of Impella Left Ventricular Assist Device 5.5 USING HEMASHIELD PLATINUM GRAFT;  Surgeon: Kerin Perna, MD;  Location: [facility] OR;  Service: Open Heart Surgery;;  graft placed into aorta   REMOVAL OF IMPELLA LEFT VENTRICULAR ASSIST DEVICE N/A 11/03/2019   Procedure: REMOVAL OF IMPELLA 5.5 LEFT VENTRICULAR ASSIST DEVICE;  Surgeon: Zenaida Niece  Greg Cutter, MD;  Location: [facility] OR;  Service: Open Heart Surgery;  Laterality: N/A;   STERNAL CLOSURE N/A 10/30/2019   Procedure: Sternal Closure;  Surgeon: Kerin Perna, MD;  Location: [facility] OR;  Service: Open  Heart Surgery;  Laterality: N/A;   STERNAL WIRES REMOVAL N/A 05/14/2020   Procedure: STERNAL WIRES REMOVAL;  Surgeon: Kerin Perna, MD;  Location: [facility] OR;  Service: Thoracic;  Laterality: N/A;   TEE WITHOUT CARDIOVERSION N/A 10/25/2019   Procedure: TRANSESOPHAGEAL ECHOCARDIOGRAM (TEE);  Surgeon: Donata Clay, Theron Arista, MD;  Location: [facility] OR;  Service: Open Heart Surgery;  Laterality: N/A;   TEE WITHOUT CARDIOVERSION N/A 10/30/2019   Procedure: TRANSESOPHAGEAL ECHOCARDIOGRAM (TEE);  Surgeon: Donata Clay, Theron Arista, MD;  Location: [facility] OR;  Service: Open Heart Surgery;  Laterality: N/A;   TEE WITHOUT CARDIOVERSION N/A 11/03/2019   Procedure: TRANSESOPHAGEAL ECHOCARDIOGRAM (TEE);  Surgeon: Donata Clay, Theron Arista, MD;  Location: [facility] OR;  Service: Open Heart Surgery;  Laterality: N/A;   Past Medical History:  Diagnosis Date   Alcohol abuse    Anxiety    CAD (coronary artery disease)    Cardiac arrest (HCC)    Cardiogenic shock (HCC)    CHF (congestive heart failure) (HCC)    Cough    Dysrhythmia    GERD (gastroesophageal reflux disease)    HFrEF (heart failure with reduced ejection fraction) (HCC)    Hypertension    Hypertriglyceridemia    Ischemic cardiomyopathy    Right atrial thrombus    Tobacco abuse    There were no vitals taken for this visit.  Opioid Risk Score:   Fall Risk Score:  `1  Depression screen [facility] 2/9     12/19/2021    2:07 PM 11/18/2021    9:13 AM 10/21/2021    3:23 PM 09/19/2021    2:28 PM 08/19/2021    9:49 AM 06/26/2021    3:35 PM 05/02/2021    3:52 PM  Depression screen [facility] 2/9  Decreased Interest 0 0 0 0 0 0 0  Down, Depressed, Hopeless 0 0 0 0 0 0 0  PHQ - 2 Score 0 0 0 0 0 0 0     Review of Systems  Constitutional: Negative.   HENT: Negative.    Eyes: Negative.   Respiratory: Negative.    Cardiovascular: Negative.   Gastrointestinal: Negative.   Endocrine: Negative.   Genitourinary: Negative.   Musculoskeletal:  Positive for arthralgias, back pain and myalgias.  Skin:  Negative.   Allergic/Immunologic: Negative.   Neurological:        Tingling  Hematological:  Bruises/bleeds easily.       Plavix  Psychiatric/Behavioral: Negative.    All other systems reviewed and are negative.      Objective:    Not performed      Assessment & Plan:  1. Left Shoulder Pain: Continie HEP as Tolerated. Continue current medication regimen. Continue to monitor. 11/22/2020  2.Left Greater Trochanter Bursitis: S/P Left Hip Injection with Dr Carlis Abbott on 09/03/2020, no relief noted he reports, Continue to alternate with Heat an Ice Therapy. Continue to monitor. 11/22/2020  3. Bilateral  Knees chondral defects: Continue HEP as Tolerated. Continue current medication regimen. 11/22/2020. Discussed results of bilateral knee MRIs which showed chondral deficits. Advised application of voltaren gel. Advised icing in evenings. Did not benefit from prior steroid taper. Referred to ortho for eval for arthroscopic debridement.  -discussed that pain could be secondary to opioid induced testoserone deficiency -testosterone level checked and it  is is low. -Discussed the risks and benefits of testosterone replacement therapy and he is interested in trying this  -PSA should be monitored while using testosterone replacement therapy as there is an increased risk of prostate cancer.  -recommended Wobenxyme -refilled oxycodone and gabapentin.  -Discussed following foods that may reduce pain: 1) Ginger (especially studied for arthritis)- reduce leukotriene production to decrease inflammation 2) Blueberries- high in phytonutrients that decrease inflammation 3) Salmon- marine omega-3s reduce joint swelling and pain 4) Pumpkin seeds- reduce inflammation 5) dark chocolate- reduces inflammation 6) turmeric- reduces inflammation 7) tart cherries - reduce pain and stiffness 8) extra virgin olive oil - its compound olecanthal helps to block prostaglandins  9) chili peppers- can be eaten or applied  topically via capsaicin 10) mint- helpful for headache, muscle aches, joint pain, and itching 11) garlic- reduces inflammation  Link to further information on diet for chronic pain: http://www.bray.com/   3. Chest Wall Discomfort/ Neuropathic Pain: Continue Gabapentin. Continue to Monitor. 11/22/2020. Prescribed lidocaine patch.  -Cymbalta 20mg  daily prescribed -Stop Topamax.  -encouraged follow-up with cardiologist -prescribed nitroglycerin PRN -continue Percocet -Discussed Qutenza as an option for neuropathic pain control. Discussed that this is a capsaicin patch, stronger than capsaicin cream. Discussed that it is currently approved for diabetic peripheral neuropathy and post-herpetic neuralgia, but that it has also shown benefit in treating other forms of neuropathy. Provided patient with link to site to learn more about the patch: https://www.clark.biz/. Discussed that the patch would be placed in office and benefits usually last 3 months. Discussed that unintended exposure to capsaicin can cause severe irritation of eyes, mucous membranes, respiratory tract, and skin, but that Qutenza is a local treatment and does not have the systemic side effects of other nerve medications. Discussed that there may be pain, itching, erythema, and decreased sensory function associated with the application of Qutenza. Side effects usually subside within 1 week. A cold pack of analgesic medications can help with these side effects. Blood pressure can also be increased due to pain associated with administration of the patch.    -4. Chronic Pain Syndrome: :  continue Oxycodone 10/325 mg 6 times a day as needed for pain #180. We will continue the opioid monitoring program, this consists of regular clinic visits, examinations, urine drug screen, pill counts as well as use of [place] Controlled Substance Reporting system. A 12 month  History has been reviewed on the [place] Controlled Substance Reporting System on 11/22/2020. Discussed risks and benefits of opioid medications.  -Provided with a pain relief journal and discussed that it contains foods and lifestyle tips to naturally help to improve pain. Discussed that these lifestyle strategies are also very good for health unlike some medications which can have negative side effects. Discussed that the act of keeping a journal can be therapeutic and helpful to realize patterns what helps to trigger and alleviate pain.   -Discussed following foods that may reduce pain: 1) Ginger (especially studied for arthritis)- reduce leukotriene production to decrease inflammation 2) Blueberries- high in phytonutrients that decrease inflammation 3) Salmon- marine omega-3s reduce joint swelling and pain 4) Pumpkin seeds- reduce inflammation 5) dark chocolate- reduces inflammation 6) turmeric- reduces inflammation 7) tart cherries - reduce pain and stiffness 8) extra virgin olive oil - its compound olecanthal helps to block prostaglandins  9) chili peppers- can be eaten or applied topically via capsaicin 10) mint- helpful for headache, muscle aches, joint pain, and itching 11) garlic- reduces inflammation  Link to further information  on diet for chronic pain: http://www.bray.com/   5) Insomnia: continue trazodone 50mg  HS  6) Low back pain: -Discussed results of MRI which shows disc degeneration, spinal stenosis, and bone marrow edema -discussed risks and benefits of extracorporeal shockwave therapy and will plan for this next visit.  -Given worsening pain and radiation of pain into legs intermittently with occasional weakness, new MRI ordered, will follow-up on this with April  -discussed that hemp products not recommended while receiving opioids as may show up as THC in his urine sample -discussed that muscle  and ligament pathology can also cause severe pain at times -encouraged continued exercise and working to send blood flow to injured area and promote healing -Provided with a pain relief journal and discussed that it contains foods and lifestyle tips to naturally help to improve pain. Discussed that these lifestyle strategies are also very good for health unlike some medications which can have negative side effects. Discussed that the act of keeping a journal can be therapeutic and helpful to realize patterns what helps to trigger and alleviate pain.   -Wobenzyme -Discussed response to decrease in atorvastatin. Discussed that cholesterol is important for muscle function and lowering cholesterol levels too much is associated with joint pain in some patients. Discussed that some patients feel relief as soon as decreasing statin medications, whereas for others it takes a longer time to see if pain improves or not. Recommended repeating his lipid panel prior to when he is due for his next refill so that we make sure his LDL does not increase above 100.   7) Cervical radiuculitis: -Discussed that cervical XR showed disc degeneration  8) Testosterone deficiency Discussed that testosterone level has improved with his exercise and dietary changes  9) Trigger finger, left -recommended brace -if this does not help, recommend orthopedic follow-up for trigger point injection  10) Prostate cancer screening: -PSA check today.   11) HLD: -reviewed last lipid panel with him -discussed checking lipid panel with cardiologist.  -recommend decreasing atorvastatin to 20mg , but should discuss with cardiologist.   12. Suboptimal vitamin D -prescribed 50,000U ergocalciferol once per week for 7 weeks -now can continue over the counter D3  13) Trigger finger -sent him a picture of brace he can purchase on [place] -referred to surgery for steroid injection if brace fails to help  14) Insomnia -prescribed  amitriptyline 10mg  HS  7 minutes spent in discussion of the return of his chest pain, recommending trying Qutenza again

## 2022-04-02 ENCOUNTER — Other Ambulatory Visit: Payer: Self-pay

## 2022-04-02 DIAGNOSIS — M792 Neuralgia and neuritis, unspecified: Secondary | ICD-10-CM

## 2022-04-02 MED ORDER — CAPSAICIN-CLEANSING GEL 8 % EX KIT: 1.0000 | PACK | Freq: Once | CUTANEOUS | 0 refills | Status: AC

## 2022-04-06 ENCOUNTER — Encounter: Payer: 59 | Admitting: Physical Medicine and Rehabilitation

## 2022-04-06 ENCOUNTER — Encounter: Payer: Self-pay | Admitting: Physical Medicine and Rehabilitation

## 2022-04-06 VITALS — BP 143/93 | HR 82 | Ht 68.0 in | Wt 181.0 lb

## 2022-04-06 DIAGNOSIS — R0789 Other chest pain: Secondary | ICD-10-CM | POA: Diagnosis not present

## 2022-04-06 DIAGNOSIS — M170 Bilateral primary osteoarthritis of knee: Secondary | ICD-10-CM | POA: Diagnosis not present

## 2022-04-06 DIAGNOSIS — Z79899 Other long term (current) drug therapy: Secondary | ICD-10-CM

## 2022-04-06 DIAGNOSIS — Z5181 Encounter for therapeutic drug level monitoring: Secondary | ICD-10-CM | POA: Diagnosis not present

## 2022-04-06 DIAGNOSIS — G894 Chronic pain syndrome: Secondary | ICD-10-CM | POA: Diagnosis not present

## 2022-04-06 MED ORDER — AMITRIPTYLINE HCL 10 MG PO TABS: 20.0000 mg | ORAL_TABLET | Freq: Every day | ORAL | 3 refills | Status: DC

## 2022-04-06 MED ORDER — GABAPENTIN 800 MG PO TABS: 800.0000 mg | ORAL_TABLET | Freq: Three times a day (TID) | ORAL | 3 refills | Status: DC

## 2022-04-06 MED ORDER — OXYCODONE HCL 15 MG PO TABS: 15.0000 mg | ORAL_TABLET | Freq: Four times a day (QID) | ORAL | 0 refills | Status: DC | PRN

## 2022-04-06 NOTE — Patient Instructions (Signed)
Insomnia: -Try to go outside near sunrise -Get exercise during the day.  -Turn off all devices an hour before bedtime.  -Teas that can benefit: chamomile, valerian root, Brahmi (Bacopa) -Can consider over the counter melatonin, magnesium, and/or L-theanine. Melatonin is an anti-oxidant with multiple health benefits. Magnesium is involved in greater than 300 enzymatic reactions in the body and most of [place] are deficient as our soil is often depleted. There are 7 different types of magnesium- Bioptemizer's is a supplement with all 7 types, and each has unique benefits. Magnesium can also help with constipation and anxiety.  -Pistachios naturally increase the production of melatonin -[employer]zy [employer] bamboo bed sheets are free from toxic chemicals.  -Tart cherry juice or a tart cherry supplement can improve sleep and soreness post-workout   Foods that can help with pain: 1) Ginger (especially studied for arthritis)- reduce leukotriene production to decrease inflammation 2) Blueberries- high in phytonutrients that decrease inflammation 3) Salmon- marine omega-3s reduce joint swelling and pain 4) Pumpkin seeds- reduce inflammation 5) dark chocolate- reduces inflammation 6) turmeric- reduces inflammation 7) tart cherries - reduce pain and stiffness 8) extra virgin olive oil - its compound olecanthal helps to block prostaglandins  9) chili peppers- can be eaten or applied topically via capsaicin 10) mint- helpful for headache, muscle aches, joint pain, and itching 11) garlic- reduces inflammation  Link to further information on diet for chronic pain: [email or web address]

## 2022-04-06 NOTE — Addendum Note (Signed)
Addended by: Horton Chin on: 04/06/2022 01:30 PM   Modules accepted: Orders

## 2022-04-06 NOTE — Progress Notes (Addendum)
Subjective:    Patient ID: William Crawford, male    DOB: 10-Jan-1971, 51 y.o.   MRN: [id number]   William Crawford is a 51 y.o. male who presents for f/u of chronic low back pain, bilateral knee pain, chest pain, trigger finger, testosterone deficiency, and neck pain.   1) Chest pain -after several months of no chest pain, patient reports a recurrence of chest pain this week -he had 3 episodes this week which felt like sharp, shooting pains -these episodes lasted 30-45 seconds -previously his chest pain had resolved after Qutenza administration -he asks why it may have started again. -has not been present since he last called me -currently pain is worse in both knees and his low back. He cannot say whether the testosterone supplement helps.  -he asks whether he should work with a Land. -has affected his ability to work, but he pushes through, but he has been wondering whether to pursue a different job. He has been working at his current job for a long time and it is hard on his back and knees. He does feel that asking for accommodations- particularly less times on his knees would help. He does use knee pads to support his knees.  -he wanted to pursue the Qutenza patch but it is not covered by his new insurance plan. -back pain has been worsening -he would benefit from a letter with work restrictions -he would like to pursue MRI, has not yet heard about this.  -he takes Gabapentin. He takes 800mg  TID. He thinks this helps with his opioid.  -has been using  electrical stimulator and that really helps -he asks what else he can take during the day  -His pain medication does not last as long as he would like,  -some days his pain is worse.  -He gets electric type pain down the legs almost every day more down left side than right.  -he asks if he can use hemp seed oil -he discussed with his cardiologist decreasing his atorvastatin dose and it has been decreased to 20mg . He has not  had any improvements in pain since this change, if fact pain has worsened -he had a busy day yesterday working on his parents' as they have an upcoming drive to and he wanted to make sure it was safe for them to drive their car. He feels that this may have contributed to his worsening pain -been ok -he would like to know his vitamin D and testosterone level results and discuss treatment for his trigger finger -qutenza has helped minimize his chest pain, it is now gone. -had a death in the family- uncle had a pneumonia. He lives in [place]. He died before he could leave work. Her aunt is taking this pretty hard. He was really close to him. They used to talk 2-3 times per week.  -The knee pain is located just under the knee cap and extends medially to laterally -He is feeling much better this week and has been able to work, but still having significant knee and low back pain -Also has chest pain at times- sharp and lasts three minutes when he has it.  -Pain has grown excruciating but he has been able to return to work as he needs to financially. -Discussed results of his bilateral knee MRI, his neck XR, and his back XR -He continues to have chest pain and had recent follow-up with cardiology. Would still like to try the Qutenza patch -the  Cymbalta did not help.  -he requests if oxycodone can be increased to 6 tablets per day.  -does not note radiation of pain into legs, but legs have given out dur to pain causing near fall -he does not note loss of bowel or bladder function -does note constipation -also having shooting pain down both arms into pinky fingers -discussed that bilateral knee pain and back pain could potentially be related to opioid-induced testosterone deficiency and he is agreeable to checking testosterone levels -he is also interested in ortho follow-up for his knee pain -he is feeling better today and was able to go to work -had burning pain over the weekend  from the Qutenza that is now gone. Forgot to use the cooling gel. -he has never had PSA checked, discussed that result is 0.4 which is low, so we could trial testosterone patch given his deficiency and symptoms if he would like. He is agreeable and I have sent to his pharmacy.   Prior history: He states his pain is located in his left shoulder, lower back pain, left hip and bilateral knee pain. He was in contact with Dr Carlis Abbott regarding his increase intensity of pain, per Dr Carlis Abbott note his oxycodone will be increased to 5 times a day as needed for pain. William Crawford understanding.  He rates his pain 4. His current exercise regime is walking and performing stretching exercises.  William Crawford Morphine equivalent is 60.00  MME.   Last UDs was Performed on 08/28/2020, it was consistent.   He is having a good summer.   He fell down and hit his right ankle and got a big bruise- not hurting too bad.   He had a good time with his grandson riding rolling coasters at [employer]  His chest pain returned and it feels sore and he plans to f/u with his surgeon regarding the pain. It feels sharp, hot, like a burning He takes Gabapentin 600mg - but that doesn't help unless used with the oxycodone.  2) Currently his pain is worse in bilateral knees- it is worst when he kneels, climbs ladders. He has a supportive community at work which allow him to limit his kneeling and he has been using pads when he kneels. His oxycodone is now only lasting him about 2.5 hours after he takes it and he has been taking them 5 times per day. He asks if there is anything stronger he can take. He has tried Voltaren gel in the past and its effect usually started an hour after application.  -got compression sleeves to apply to his knees and has been able to work -at times his knee still buckles and this is painful -he feels he is getting used to the pain with walking but as far as climbing he feels it a lot more -he got a  steroid injection by orthopedics and did find that it helped but did not last very long.  -he has been trying to eat more foods that reduce inflammation -this often keeps him up at night -he asks whether he can take cayenne peppers  Low testosterone:  -he bought an over the counter supplement since the patch was not covered by insurance -takes magnesium, B1, zinc, vitamin D.      Pain Inventory Average Pain 7 Pain Right Now 9 My pain is constant, sharp, burning, stabbing, and aching  In the last 24 hours, has pain interfered with the following? General activity 8 Relation with others 0 Enjoyment of life 2  What TIME of day is your pain at its worst? morning , daytime, and evening Sleep (in general) Poor  Pain is worse with: walking, bending, sitting, and some activites Pain improves with: medication Relief from Meds: 5  Family History  Problem Relation Age of Onset   Heart attack Father    Heart disease Father    Heart attack Paternal Grandfather    Heart disease Paternal Grandfather    Heart attack Paternal Uncle    Heart disease Paternal Uncle    Heart attack Paternal Uncle    Heart disease Paternal Uncle    Social History   Socioeconomic History   Marital status: Single    Spouse name: Not on file   Number of children: Not on file   Years of education: Not on file   Highest education level: Not on file  Occupational History   Not on file  Tobacco Use   Smoking status: Former    Types: Cigarettes    Quit date: 10/23/2019    Years since quitting: 2.4   Smokeless tobacco: Never  Vaping Use   Vaping Use: Never used  Substance and Sexual Activity   Alcohol use: Not Currently    Comment: QUIT  IN MAY 20121   Drug use: No   Sexual activity: Not on file  Other Topics Concern   Not on file  Social History Narrative   Not on file   Social Determinants of Health   Financial Resource Strain: Not on file  Food Insecurity: Not on file  Transportation Needs:  Not on file  Physical Activity: Not on file  Stress: Not on file  Social Connections: Not on file   Past Surgical History:  Procedure Laterality Date   APPLICATION OF WOUND VAC  10/25/2019   Procedure: Application Of Wound Vac;  Surgeon: Van Trigt, Peter, MD;  Location: [facility] OR;  Service: Open Heart Surgery;;   CANNULATION FOR ECMO (EXTRACORPOREAL MEMBRANE OXYGENATION)  10/25/2019   Procedure: Cannulation For Ecmo (Extracorporeal Membrane Oxygenation) @ 1531;  Surgeon: Van Trigt, Peter, MD;  Location: [facility] OR;  Service: Open Heart Surgery;;   CANNULATION FOR ECMO (EXTRACORPOREAL MEMBRANE OXYGENATION) N/A 10/30/2019   Procedure: DECANNULATION OF ECMO (EXTRACORPOREAL MEMBRANE OXYGENATION);  Surgeon: Van Trigt, Peter, MD;  Location: [facility] OR;  Service: Open Heart Surgery;  Laterality: N/A;  Pump standby   CHEST EXPLORATION  10/25/2019   Procedure: Chest Exploration for repair of coronary perforation (repair of LAD);  Surgeon: Van Trigt, Peter, MD;  Location: [facility] OR;  Service: Open Heart Surgery;;   CORONARY CTO INTERVENTION N/A 10/25/2019   Procedure: CORONARY CTO INTERVENTION;  Surgeon: Jordan, Peter M, MD;  [facility]: [facility];  Service: Cardiovascular;  Laterality: N/A;   CORONARY STENT INTERVENTION N/A 10/17/2019   Procedure: CORONARY STENT INTERVENTION;  Surgeon: Cooper, Michael, MD;  [facility]: [facility];  Service: Cardiovascular;  Laterality: N/A;   LEFT HEART CATH AND CORONARY ANGIOGRAPHY N/A 10/17/2019   Procedure: LEFT HEART CATH AND CORONARY ANGIOGRAPHY;  Surgeon: Cooper, Michael, MD;  [facility]: [facility];  Service: Cardiovascular;  Laterality: N/A;   MEDIASTERNOTOMY  10/25/2019   Procedure: Median Sternotomy.;  Surgeon: Van Trigt, Peter, MD;  Location: [facility];  Service: Open Heart Surgery;;   PLACEMENT OF IMPELLA LEFT VENTRICULAR ASSIST DEVICE  10/30/2019   Procedure: Placement Of Impella Left Ventricular Assist Device 5.5 USING <MEASUREMENAlroy Excell SeltAlforRu AlroExcell SeltAlforRu Alroy Excell SeltAlforRu AlroExcell SeltAlforRu Alroy Excell SeltAlforRu Alroy (Excell SeltAlforRu Alroy Excell SeltAlforRu Alroy (Excell SeltAlforRu AlroExcell SeltAlforRu Alroy Excell SeltAlforRu Alroy Excell SeltAlforRu AlroExcell SeltAlforRu Alroy (Excell SeltAlforRu Alroy Excell SeltAlforRu AlroExcell SeltAlforRu Alroy (Excell SeltAlforRu Alroy Excell SeltAlforRu Alroy Excell SeltAlforRu AlroExcell SeltAlforRuben GottrondterPLATINUM GRAFT;  Surgeon: Van Trigt,  Peter, MD;  Location: [facility] OR;  Service: Open  Heart Surgery;;  graft placed into aorta   REMOVAL OF IMPELLA LEFT VENTRICULAR ASSIST DEVICE N/A 11/03/2019   Procedure: REMOVAL OF IMPELLA 5.5 LEFT VENTRICULAR ASSIST DEVICE;  Surgeon: Kerin Perna, MD;  Location: [facility] OR;  Service: Open Heart Surgery;  Laterality: N/A;   STERNAL CLOSURE N/A 10/30/2019   Procedure: Sternal Closure;  Surgeon: Kerin Perna, MD;  Location: [facility] OR;  Service: Open Heart Surgery;  Laterality: N/A;   STERNAL WIRES REMOVAL N/A 05/14/2020   Procedure: STERNAL WIRES REMOVAL;  Surgeon: Kerin Perna, MD;  Location: [facility] OR;  Service: Thoracic;  Laterality: N/A;   TEE WITHOUT CARDIOVERSION N/A 10/25/2019   Procedure: TRANSESOPHAGEAL ECHOCARDIOGRAM (TEE);  Surgeon: Donata Clay, Theron Arista, MD;  Location: [facility] OR;  Service: Open Heart Surgery;  Laterality: N/A;   TEE WITHOUT CARDIOVERSION N/A 10/30/2019   Procedure: TRANSESOPHAGEAL ECHOCARDIOGRAM (TEE);  Surgeon: Donata Clay, Theron Arista, MD;  Location: [facility] OR;  Service: Open Heart Surgery;  Laterality: N/A;   TEE WITHOUT CARDIOVERSION N/A 11/03/2019   Procedure: TRANSESOPHAGEAL ECHOCARDIOGRAM (TEE);  Surgeon: Donata Clay, Theron Arista, MD;  Location: [facility] OR;  Service: Open Heart Surgery;  Laterality: N/A;   Past Surgical History:  Procedure Laterality Date   APPLICATION OF WOUND VAC  10/25/2019   Procedure: Application Of Wound Vac;  Surgeon: Kerin Perna, MD;  Location: [facility] OR;  Service: Open Heart Surgery;;   CANNULATION FOR ECMO (EXTRACORPOREAL MEMBRANE OXYGENATION)  10/25/2019   Procedure: Cannulation For Ecmo (Extracorporeal Membrane Oxygenation) @ 1531;  Surgeon: Kerin Perna, MD;  Location: [facility] OR;  Service: Open Heart Surgery;;   CANNULATION FOR ECMO (EXTRACORPOREAL MEMBRANE OXYGENATION) N/A 10/30/2019   Procedure: DECANNULATION OF ECMO (EXTRACORPOREAL MEMBRANE OXYGENATION);  Surgeon: Donata Clay, Theron Arista, MD;  Location: [facility] OR;  Service: Open Heart Surgery;  Laterality: N/A;  Pump standby   CHEST  EXPLORATION  10/25/2019   Procedure: Chest Exploration for repair of coronary perforation (repair of LAD);  Surgeon: Donata Clay, Theron Arista, MD;  Location: [facility] OR;  Service: Open Heart Surgery;;   CORONARY CTO INTERVENTION N/A 10/25/2019   Procedure: CORONARY CTO INTERVENTION;  Surgeon: [place], Peter M, MD;  Location: [facility] INVASIVE CV LAB;  Service: Cardiovascular;  Laterality: N/A;   CORONARY STENT INTERVENTION N/A 10/17/2019   Procedure: CORONARY STENT INTERVENTION;  Surgeon: Tonny Bollman, MD;  Location: [facility] INVASIVE CV LAB;  Service: Cardiovascular;  Laterality: N/A;   LEFT HEART CATH AND CORONARY ANGIOGRAPHY N/A 10/17/2019   Procedure: LEFT HEART CATH AND CORONARY ANGIOGRAPHY;  Surgeon: Tonny Bollman, MD;  Location: [facility] INVASIVE CV LAB;  Service: Cardiovascular;  Laterality: N/A;   MEDIASTERNOTOMY  10/25/2019   Procedure: Median Sternotomy.;  Surgeon: Kerin Perna, MD;  Location: [facility] OR;  Service: Open Heart Surgery;;   PLACEMENT OF IMPELLA LEFT VENTRICULAR ASSIST DEVICE  10/30/2019   Procedure: Placement Of Impella Left Ventricular Assist Device 5.5 USING HEMASHIELD PLATINUM GRAFT;  Surgeon: Kerin Perna, MD;  Location: [facility] OR;  Service: Open Heart Surgery;;  graft placed into aorta   REMOVAL OF IMPELLA LEFT VENTRICULAR ASSIST DEVICE N/A 11/03/2019   Procedure: REMOVAL OF IMPELLA 5.5 LEFT VENTRICULAR ASSIST DEVICE;  Surgeon: Kerin Perna, MD;  Location: [facility] OR;  Service: Open Heart Surgery;  Laterality: N/A;   STERNAL CLOSURE N/A 10/30/2019   Procedure: Sternal Closure;  Surgeon: Kerin Perna, MD;  [facility];  Service: Open Heart Surgery;  Laterality: N/A;   STERNAL WIRES REMOVAL N/A 05/14/2020   Procedure: STERNAL WIRES REMOVAL;  Surgeon: Donata Clay, Theron Arista, MD;  Location: [facility] OR;  Service: Thoracic;  Laterality: N/A;   TEE WITHOUT CARDIOVERSION N/A 10/25/2019   Procedure: TRANSESOPHAGEAL ECHOCARDIOGRAM (TEE);  Surgeon: Donata Clay, Theron Arista, MD;  Location: [facility] OR;  Service: Open Heart Surgery;   Laterality: N/A;   TEE WITHOUT CARDIOVERSION N/A 10/30/2019   Procedure: TRANSESOPHAGEAL ECHOCARDIOGRAM (TEE);  Surgeon: Donata Clay, Theron Arista, MD;  Location: [facility] OR;  Service: Open Heart Surgery;  Laterality: N/A;   TEE WITHOUT CARDIOVERSION N/A 11/03/2019   Procedure: TRANSESOPHAGEAL ECHOCARDIOGRAM (TEE);  Surgeon: Donata Clay, Theron Arista, MD;  Location: [facility] OR;  Service: Open Heart Surgery;  Laterality: N/A;   Past Medical History:  Diagnosis Date   Alcohol abuse    Anxiety    CAD (coronary artery disease)    Cardiac arrest (HCC)    Cardiogenic shock (HCC)    CHF (congestive heart failure) (HCC)    Cough    Dysrhythmia    GERD (gastroesophageal reflux disease)    HFrEF (heart failure with reduced ejection fraction) (HCC)    Hypertension    Hypertriglyceridemia    Ischemic cardiomyopathy    Right atrial thrombus    Tobacco abuse    Ht 5\' 8"  (1.727 m)   Wt 181 lb (82.1 kg)   BMI 27.52 kg/m   Opioid Risk Score:   Fall Risk Score:  `1  Depression screen PHQ 2/9     04/06/2022    1:10 PM 12/19/2021    2:07 PM 11/18/2021    9:13 AM 10/21/2021    3:23 PM 09/19/2021    2:28 PM 08/19/2021    9:49 AM 06/26/2021    3:35 PM  Depression screen PHQ 2/9  Decreased Interest 0 0 0 0 0 0 0  Down, Depressed, Hopeless 0 0 0 0 0 0 0  PHQ - 2 Score 0 0 0 0 0 0 0     Review of Systems  Constitutional: Negative.   HENT: Negative.    Eyes: Negative.   Respiratory: Negative.    Cardiovascular: Negative.   Gastrointestinal: Negative.   Endocrine: Negative.   Genitourinary: Negative.   Musculoskeletal:  Positive for arthralgias, back pain and myalgias.  Skin: Negative.   Allergic/Immunologic: Negative.   Neurological:        Tingling  Hematological:  Bruises/bleeds easily.       Plavix  Psychiatric/Behavioral: Negative.    All other systems reviewed and are negative.      Objective:    Not performed      Assessment & Plan:  1. Left Shoulder Pain: Continie HEP as Tolerated. Continue  current medication regimen. Continue to monitor. 11/22/2020  2.Left Greater Trochanter Bursitis: S/P Left Hip Injection with Dr Carlis Abbott on 09/03/2020, no relief noted he reports, Continue to alternate with Heat an Ice Therapy. Continue to monitor. 11/22/2020  3. Bilateral  Knees chondral defects: Continue HEP as Tolerated. Continue current medication regimen. 11/22/2020. Discussed results of bilateral knee MRIs which showed chondral deficits. Advised application of voltaren gel. Advised icing in evenings. Did not benefit from prior steroid taper. Referred to ortho for eval for arthroscopic debridement.  Discussed voltaren gel -discussed that pain could be secondary to opioid induced testoserone deficiency -testosterone level checked and it is is low. -Discussed the risks and benefits of testosterone replacement therapy and he is interested in trying this  -PSA should be monitored while using testosterone replacement therapy as there is an increased risk of prostate cancer.  -recommended Wobenxyme -refilled oxycodone and gabapentin.  -  Discussed following foods that may reduce pain: 1) Ginger (especially studied for arthritis)- reduce leukotriene production to decrease inflammation 2) Blueberries- high in phytonutrients that decrease inflammation 3) Salmon- marine omega-3s reduce joint swelling and pain 4) Pumpkin seeds- reduce inflammation 5) dark chocolate- reduces inflammation 6) turmeric- reduces inflammation 7) tart cherries - reduce pain and stiffness 8) extra virgin olive oil - its compound olecanthal helps to block prostaglandins  9) chili peppers- can be eaten or applied topically via capsaicin 10) mint- helpful for headache, muscle aches, joint pain, and itching 11) garlic- reduces inflammation  Link to further information on diet for chronic pain: http://www.bray.com/   3. Chest Wall Discomfort/ Neuropathic  Pain: Continue Gabapentin. Continue to Monitor. 11/22/2020. Prescribed lidocaine patch.  -Cymbalta  daily prescribed -Stop Topamax.  -encouraged follow-up with cardiologist -prescribed nitroglycerin PRN -continue Percocet -Discussed Qutenza as an option for neuropathic pain control. Discussed that this is a capsaicin patch, stronger than capsaicin cream. Discussed that it is currently approved for diabetic peripheral neuropathy and post-herpetic neuralgia, but that it has also shown benefit in treating other forms of neuropathy. Provided patient with link to site to learn more about the patch: https://www.clark.biz/. Discussed that the patch would be placed in office and benefits usually last 3 months. Discussed that unintended exposure to capsaicin can cause severe irritation of eyes, mucous membranes, respiratory tract, and skin, but that Qutenza is a local treatment and does not have the systemic side effects of other nerve medications. Discussed that there may be pain, itching, erythema, and decreased sensory function associated with the application of Qutenza. Side effects usually subside within 1 week. A cold pack of analgesic medications can help with these side effects. Blood pressure can also be increased due to pain associated with administration of the patch.    -4. Chronic Pain Syndrome: :  continue Oxycodone 15/325 mg 6 times a day as needed for pain #180. We will continue the opioid monitoring program, this consists of regular clinic visits, examinations, urine drug screen, pill counts as well as use of [place] Controlled Substance Reporting system. A 12 month History has been reviewed on the [place] Controlled Substance Reporting System on 11/22/2020. Discussed risks and benefits of opioid medications.  -Provided with a pain relief journal and discussed that it contains foods and lifestyle tips to naturally help to improve pain. Discussed that these lifestyle strategies  are also very good for health unlike some medications which can have negative side effects. Discussed that the act of keeping a journal can be therapeutic and helpful to realize patterns what helps to trigger and alleviate pain.   -Discussed following foods that may reduce pain: 1) Ginger (especially studied for arthritis)- reduce leukotriene production to decrease inflammation 2) Blueberries- high in phytonutrients that decrease inflammation 3) Salmon- marine omega-3s reduce joint swelling and pain 4) Pumpkin seeds- reduce inflammation 5) dark chocolate- reduces inflammation 6) turmeric- reduces inflammation 7) tart cherries - reduce pain and stiffness 8) extra virgin olive oil - its compound olecanthal helps to block prostaglandins  9) chili peppers- can be eaten or applied topically via capsaicin 10) mint- helpful for headache, muscle aches, joint pain, and itching 11) garlic- reduces inflammation  Link to further information on diet for chronic pain: http://www.bray.com/   5) Insomnia: continue trazodone  HS  6) Low back pain: -Discussed results of MRI which shows disc degeneration, spinal stenosis, and bone marrow edema -discussed risks and benefits of extracorporeal shockwave therapy and will plan for this  next visit.  -Given worsening pain and radiation of pain into legs intermittently with occasional weakness, new MRI ordered, will follow-up on this with April  -discussed that hemp products not recommended while receiving opioids as may show up as THC in his urine sample -discussed that muscle and ligament pathology can also cause severe pain at times -encouraged continued exercise and working to send blood flow to injured area and promote healing -Provided with a pain relief journal and discussed that it contains foods and lifestyle tips to naturally help to improve pain. Discussed that these lifestyle  strategies are also very good for health unlike some medications which can have negative side effects. Discussed that the act of keeping a journal can be therapeutic and helpful to realize patterns what helps to trigger and alleviate pain.   -Wobenzyme -Discussed response to decrease in atorvastatin. Discussed that cholesterol is important for muscle function and lowering cholesterol levels too much is associated with joint pain in some patients. Discussed that some patients feel relief as soon as decreasing statin medications, whereas for others it takes a longer time to see if pain improves or not. Recommended repeating his lipid panel prior to when he is due for his next refill so that we make sure his LDL does not increase above 100.   7) Cervical radiuculitis: -Discussed that cervical XR showed disc degeneration  8) Testosterone deficiency Discussed that testosterone level has improved with his exercise and dietary changes  9) Trigger finger, left -recommended brace -if this does not help, recommend orthopedic follow-up for trigger point injection  10) Prostate cancer screening: -PSA check today.   11) HLD: -reviewed last lipid panel with him -discussed checking lipid panel with cardiologist.  -recommend decreasing atorvastatin to 20mg , but should discuss with cardiologist.   12. Suboptimal vitamin D -prescribed 50,000U ergocalciferol once per week for 7 weeks -now can continue over the counter D3  13) Trigger finger -sent him a picture of brace he can purchase on [place] -referred to surgery for steroid injection if brace fails to help  14) Insomnia -increase amitriptyline to 20mg  HS -Try to go outside near sunrise -Get exercise during the day.  -Turn off all devices an hour before bedtime.  -Teas that can benefit: chamomile, valerian root, Brahmi (Bacopa) -Can consider over the counter melatonin, magnesium, and/or L-theanine. Melatonin is an anti-oxidant with multiple health  benefits. Magnesium is involved in greater than 300 enzymatic reactions in the body and most of [place] are deficient as our soil is often depleted. There are 7 different types of magnesium- Bioptemizer's is a supplement with all 7 types, and each has unique benefits. Magnesium can also help with constipation and anxiety.  -Pistachios naturally increase the production of melatonin -[employer] bamboo bed sheets are free from toxic chemicals.  -Tart cherry juice or a tart cherry supplement can improve sleep and soreness post-workout

## 2022-04-09 ENCOUNTER — Telehealth: Payer: Self-pay | Admitting: *Deleted

## 2022-04-09 LAB — TOXASSURE SELECT,+ANTIDEPR,UR

## 2022-04-09 NOTE — Telephone Encounter (Signed)
Urine drug screen for this encounter is consistent for prescribed medication 

## 2022-04-23 NOTE — Telephone Encounter (Signed)
error 

## 2022-04-27 ENCOUNTER — Encounter: Payer: 59 | Admitting: Registered Nurse

## 2022-04-28 ENCOUNTER — Other Ambulatory Visit: Payer: Self-pay | Admitting: Physical Medicine and Rehabilitation

## 2022-04-28 ENCOUNTER — Telehealth: Payer: Self-pay

## 2022-04-28 MED ORDER — AMITRIPTYLINE HCL 10 MG PO TABS: 20.0000 mg | ORAL_TABLET | Freq: Every day | ORAL | 3 refills | Status: DC

## 2022-04-28 NOTE — Telephone Encounter (Signed)
90 day supply of Amitriptyline HCL 10 MG requested.

## 2022-05-05 ENCOUNTER — Encounter: Payer: Self-pay | Admitting: Physical Medicine and Rehabilitation

## 2022-05-05 ENCOUNTER — Encounter: Payer: 59 | Attending: Physical Medicine and Rehabilitation | Admitting: Physical Medicine and Rehabilitation

## 2022-05-05 VITALS — BP 139/89 | HR 92 | Ht 68.0 in | Wt 182.0 lb

## 2022-05-05 DIAGNOSIS — R0789 Other chest pain: Secondary | ICD-10-CM | POA: Diagnosis not present

## 2022-05-05 DIAGNOSIS — M170 Bilateral primary osteoarthritis of knee: Secondary | ICD-10-CM

## 2022-05-05 DIAGNOSIS — G4701 Insomnia due to medical condition: Secondary | ICD-10-CM | POA: Diagnosis not present

## 2022-05-05 MED ORDER — VITAMIN D (ERGOCALCIFEROL) 1.25 MG (50000 UNIT) PO CAPS: 50000.0000 [IU] | ORAL_CAPSULE | ORAL | 0 refills | Status: DC

## 2022-05-05 MED ORDER — OXYCODONE HCL 15 MG PO TABS: 15.0000 mg | ORAL_TABLET | Freq: Four times a day (QID) | ORAL | 0 refills | Status: DC | PRN

## 2022-05-05 NOTE — Progress Notes (Signed)
Subjective:    Patient ID: William Crawford, male    DOB: 11-Jun-1970, 51 y.o.   MRN: [id number]   William Crawford is a 51 y.o. male who presents for f/u of chronic low back pain, bilateral knee pain, chest pain, trigger finger, testosterone deficiency, and neck pain.   1) Chest pain -laid off regular job recently as his employer was running out of money -the nitroglycerin he has been carrying around but has not needed it, symptoms have improved -he was able to get another full time job -after several months of no chest pain, patient reports a recurrence of chest pain this week -he had 3 episodes this week which felt like sharp, shooting pains -these episodes lasted 30-45 seconds -previously his chest pain had resolved after Qutenza administration -he asks why it may have started again. -has not been present since he last called me -currently pain is worse in both knees and his low back. He cannot say whether the testosterone supplement helps.  -he asks whether he should work with a Land. -has affected his ability to work, but he pushes through, but he has been wondering whether to pursue a different job. He has been working at his current job for a long time and it is hard on his back and knees. He does feel that asking for accommodations- particularly less times on his knees would help. He does use knee pads to support his knees.  -he wanted to pursue the Qutenza patch but it is not covered by his new insurance plan. -back pain has been worsening -he would benefit from a letter with work restrictions -he would like to pursue MRI, has not yet heard about this.  -he takes Gabapentin. He takes 800mg  TID. He thinks this helps with his opioid.  -has been using  electrical stimulator and that really helps -he asks what else he can take during the day  -His pain medication does not last as long as he would like,  -some days his pain is worse.  -He gets electric type pain down the  legs almost every day more down left side than right.  -he asks if he can use hemp seed oil -he discussed with his cardiologist decreasing his atorvastatin dose and it has been decreased to 20mg . He has not had any improvements in pain since this change, if fact pain has worsened -he had a busy day yesterday working on his parents' as they have an upcoming drive to and he wanted to make sure it was safe for them to drive their car. He feels that this may have contributed to his worsening pain -been ok -he would like to know his vitamin D and testosterone level results and discuss treatment for his trigger finger -qutenza has helped minimize his chest pain, it is now gone. -had a death in the family- uncle had a pneumonia. He lives in [place]. He died before he could leave work. Her aunt is taking this pretty hard. He was really close to him. They used to talk 2-3 times per week.  -The knee pain is located just under the knee cap and extends medially to laterally -He is feeling much better this week and has been able to work, but still having significant knee and low back pain -Also has chest pain at times- sharp and lasts three minutes when he has it.  -Pain has grown excruciating but he has been able to return to work as he needs  to financially. -Discussed results of his bilateral knee MRI, his neck XR, and his back XR -He continues to have chest pain and had recent follow-up with cardiology. Would still like to try the Qutenza patch -the Cymbalta did not help.  -he requests if oxycodone can be increased to 6 tablets per day.  -does not note radiation of pain into legs, but legs have given out dur to pain causing near fall -he does not note loss of bowel or bladder function -does note constipation -also having shooting pain down both arms into pinky fingers -discussed that bilateral knee pain and back pain could potentially be related to opioid-induced testosterone  deficiency and he is agreeable to checking testosterone levels -he is also interested in ortho follow-up for his knee pain -he is feeling better today and was able to go to work -had burning pain over the weekend from the Qutenza that is now gone. Forgot to use the cooling gel. -he has never had PSA checked, discussed that result is 0.4 which is low, so we could trial testosterone patch given his deficiency and symptoms if he would like. He is agreeable and I have sent to his pharmacy.   Prior history: He states his pain is located in his left shoulder, lower back pain, left hip and bilateral knee pain. He was in contact with Dr Carlis Abbott regarding his increase intensity of pain, per Dr Carlis Abbott note his oxycodone will be increased to 5 times a day as needed for pain. Mr. chang donald understanding.  He rates his pain 4. His current exercise regime is walking and performing stretching exercises.  Mr. Kurman Morphine equivalent is 60.00  MME.   Last UDs was Performed on 08/28/2020, it was consistent.   He is having a good summer.   He fell down and hit his right ankle and got a big bruise- not hurting too bad.   He had a good time with his grandson riding rolling coasters at [employer]  His chest pain returned and it feels sore and he plans to f/u with his surgeon regarding the pain. It feels sharp, hot, like a burning He takes Gabapentin 600mg - but that doesn't help unless used with the oxycodone.  2) Currently his pain is worse in bilateral knees- it is worst when he kneels, climbs ladders. He has a supportive community at work which allow him to limit his kneeling and he has been using pads when he kneels. His oxycodone is now only lasting him about 2.5 hours after he takes it and he has been taking them 5 times per day. He asks if there is anything stronger he can take. He has tried Voltaren gel in the past and its effect usually started an hour after application.  -got compression  sleeves to apply to his knees and has been able to work -at times his knee still buckles and this is painful -he feels he is getting used to the pain with walking but as far as climbing he feels it a lot more -he got a steroid injection by orthopedics and did find that it helped but did not last very long.  -he has been trying to eat more foods that reduce inflammation -this often keeps him up at night -he asks whether he can take cayenne peppers -wind worsens the joint pain  Low testosterone:  -he bought an over the counter supplement since the patch was not covered by insurance -takes magnesium, B1, zinc, vitamin D.  Pain Inventory Average Pain 7 Pain Right Now 9 My pain is constant, sharp, burning, stabbing, and aching  In the last 24 hours, has pain interfered with the following? General activity 8 Relation with others 0 Enjoyment of life 2 What TIME of day is your pain at its worst? morning , daytime, and evening Sleep (in general) Poor  Pain is worse with: walking, bending, sitting, and some activites Pain improves with: medication Relief from Meds: 5  Family History  Problem Relation Age of Onset   Heart attack Father    Heart disease Father    Heart attack Paternal Grandfather    Heart disease Paternal Grandfather    Heart attack Paternal Uncle    Heart disease Paternal Uncle    Heart attack Paternal Uncle    Heart disease Paternal Uncle    Social History   Socioeconomic History   Marital status: Single    Spouse name: Not on file   Number of children: Not on file   Years of education: Not on file   Highest education level: Not on file  Occupational History   Not on file  Tobacco Use   Smoking status: Former    Types: Cigarettes    Quit date: 10/23/2019    Years since quitting: 2.5   Smokeless tobacco: Never  Vaping Use   Vaping Use: Never used  Substance and Sexual Activity   Alcohol use: Not Currently    Comment: QUIT  IN MAY 16109   Drug  use: No   Sexual activity: Not on file  Other Topics Concern   Not on file  Social History Narrative   Not on file   Social Determinants of Health   Financial Resource Strain: Not on file  Food Insecurity: Not on file  Transportation Needs: Not on file  Physical Activity: Not on file  Stress: Not on file  Social Connections: Not on file   Past Surgical History:  Procedure Laterality Date   APPLICATION OF WOUND VAC  10/25/2019   Procedure: Application Of Wound Vac;  Surgeon: Kerin Perna, MD;  Location: [facility] OR;  Service: Open Heart Surgery;;   CANNULATION FOR ECMO (EXTRACORPOREAL MEMBRANE OXYGENATION)  10/25/2019   Procedure: Cannulation For Ecmo (Extracorporeal Membrane Oxygenation) @ 1531;  Surgeon: Donata Clay, Theron Arista, MD;  Location: [facility] OR;  Service: Open Heart Surgery;;   CANNULATION FOR ECMO (EXTRACORPOREAL MEMBRANE OXYGENATION) N/A 10/30/2019   Procedure: DECANNULATION OF ECMO (EXTRACORPOREAL MEMBRANE OXYGENATION);  Surgeon: Donata Clay, Theron Arista, MD;  Location: [facility] OR;  Service: Open Heart Surgery;  Laterality: N/A;  Pump standby   CHEST EXPLORATION  10/25/2019   Procedure: Chest Exploration for repair of coronary perforation (repair of LAD);  Surgeon: Donata Clay, Theron Arista, MD;  Location: [facility] OR;  Service: Open Heart Surgery;;   CORONARY CTO INTERVENTION N/A 10/25/2019   Procedure: CORONARY CTO INTERVENTION;  Surgeon: Swaziland, Peter M, MD;  Location: [facility] INVASIVE CV LAB;  Service: Cardiovascular;  Laterality: N/A;   CORONARY STENT INTERVENTION N/A 10/17/2019   Procedure: CORONARY STENT INTERVENTION;  Surgeon: Tonny Bollman, MD;  Location: [facility] INVASIVE CV LAB;  Service: Cardiovascular;  Laterality: N/A;   LEFT HEART CATH AND CORONARY ANGIOGRAPHY N/A 10/17/2019   Procedure: LEFT HEART CATH AND CORONARY ANGIOGRAPHY;  Surgeon: Tonny Bollman, MD;  Location: [facility] INVASIVE CV LAB;  Service: Cardiovascular;  Laterality: N/A;   MEDIASTERNOTOMY  10/25/2019   Procedure: Median Sternotomy.;  Surgeon: Kerin Perna, MD;  Location: [facility] OR;  Service: Open Heart  Surgery;;   PLACEMENT OF IMPELLA LEFT VENTRICULAR ASSIST DEVICE  10/30/2019   Procedure: Placement Of Impella Left Ventricular Assist Device 5.5 USING HEMASHIELD PLATINUM GRAFT;  Surgeon: Kerin Perna, MD;  Location: [facility] OR;  Service: Open Heart Surgery;;  graft placed into aorta   REMOVAL OF IMPELLA LEFT VENTRICULAR ASSIST DEVICE N/A 11/03/2019   Procedure: REMOVAL OF IMPELLA 5.5 LEFT VENTRICULAR ASSIST DEVICE;  Surgeon: Kerin Perna, MD;  Location: [facility] OR;  Service: Open Heart Surgery;  Laterality: N/A;   STERNAL CLOSURE N/A 10/30/2019   Procedure: Sternal Closure;  Surgeon: Kerin Perna, MD;  Location: [facility] OR;  Service: Open Heart Surgery;  Laterality: N/A;   STERNAL WIRES REMOVAL N/A 05/14/2020   Procedure: STERNAL WIRES REMOVAL;  Surgeon: Kerin Perna, MD;  Location: [facility] OR;  Service: Thoracic;  Laterality: N/A;   TEE WITHOUT CARDIOVERSION N/A 10/25/2019   Procedure: TRANSESOPHAGEAL ECHOCARDIOGRAM (TEE);  Surgeon: Donata Clay, Theron Arista, MD;  Location: [facility] OR;  Service: Open Heart Surgery;  Laterality: N/A;   TEE WITHOUT CARDIOVERSION N/A 10/30/2019   Procedure: TRANSESOPHAGEAL ECHOCARDIOGRAM (TEE);  Surgeon: Donata Clay, Theron Arista, MD;  Location: [facility] OR;  Service: Open Heart Surgery;  Laterality: N/A;   TEE WITHOUT CARDIOVERSION N/A 11/03/2019   Procedure: TRANSESOPHAGEAL ECHOCARDIOGRAM (TEE);  Surgeon: Donata Clay, Theron Arista, MD;  Location: [facility] OR;  Service: Open Heart Surgery;  Laterality: N/A;   Past Surgical History:  Procedure Laterality Date   APPLICATION OF WOUND VAC  10/25/2019   Procedure: Application Of Wound Vac;  Surgeon: Kerin Perna, MD;  Location: [facility] OR;  Service: Open Heart Surgery;;   CANNULATION FOR ECMO (EXTRACORPOREAL MEMBRANE OXYGENATION)  10/25/2019   Procedure: Cannulation For Ecmo (Extracorporeal Membrane Oxygenation) @ 1531;  Surgeon: Kerin Perna, MD;  Location: [facility] OR;  Service: Open Heart Surgery;;   CANNULATION FOR ECMO  (EXTRACORPOREAL MEMBRANE OXYGENATION) N/A 10/30/2019   Procedure: DECANNULATION OF ECMO (EXTRACORPOREAL MEMBRANE OXYGENATION);  Surgeon: Donata Clay, Theron Arista, MD;  Location: [facility] OR;  Service: Open Heart Surgery;  Laterality: N/A;  Pump standby   CHEST EXPLORATION  10/25/2019   Procedure: Chest Exploration for repair of coronary perforation (repair of LAD);  Surgeon: Donata Clay, Theron Arista, MD;  Location: [facility] OR;  Service: Open Heart Surgery;;   CORONARY CTO INTERVENTION N/A 10/25/2019   Procedure: CORONARY CTO INTERVENTION;  Surgeon: Swaziland, Peter M, MD;  Location: [facility] INVASIVE CV LAB;  Service: Cardiovascular;  Laterality: N/A;   CORONARY STENT INTERVENTION N/A 10/17/2019   Procedure: CORONARY STENT INTERVENTION;  Surgeon: Tonny Bollman, MD;  Location: [facility] INVASIVE CV LAB;  Service: Cardiovascular;  Laterality: N/A;   LEFT HEART CATH AND CORONARY ANGIOGRAPHY N/A 10/17/2019   Procedure: LEFT HEART CATH AND CORONARY ANGIOGRAPHY;  Surgeon: Tonny Bollman, MD;  Location: [facility] INVASIVE CV LAB;  Service: Cardiovascular;  Laterality: N/A;   MEDIASTERNOTOMY  10/25/2019   Procedure: Median Sternotomy.;  Surgeon: Kerin Perna, MD;  Location: [facility] OR;  Service: Open Heart Surgery;;   PLACEMENT OF IMPELLA LEFT VENTRICULAR ASSIST DEVICE  10/30/2019   Procedure: Placement Of Impella Left Ventricular Assist Device 5.5 USING HEMASHIELD PLATINUM GRAFT;  Surgeon: Kerin Perna, MD;  Location: [facility] OR;  Service: Open Heart Surgery;;  graft placed into aorta   REMOVAL OF IMPELLA LEFT VENTRICULAR ASSIST DEVICE N/A 11/03/2019   Procedure: REMOVAL OF IMPELLA 5.5 LEFT VENTRICULAR ASSIST DEVICE;  Surgeon: Kerin Perna, MD;  Location: [facility] OR;  Service: Open Heart Surgery;  Laterality: N/A;  STERNAL CLOSURE N/A 10/30/2019   Procedure: Sternal Closure;  Surgeon: Kerin Perna, MD;  Location: [facility] OR;  Service: Open Heart Surgery;  Laterality: N/A;   STERNAL WIRES REMOVAL N/A 05/14/2020   Procedure: STERNAL WIRES REMOVAL;  Surgeon: Kerin Perna, MD;  Location: [facility] OR;  Service: Thoracic;  Laterality: N/A;   TEE WITHOUT CARDIOVERSION N/A 10/25/2019   Procedure: TRANSESOPHAGEAL ECHOCARDIOGRAM (TEE);  Surgeon: Donata Clay, Theron Arista, MD;  Location: [facility] OR;  Service: Open Heart Surgery;  Laterality: N/A;   TEE WITHOUT CARDIOVERSION N/A 10/30/2019   Procedure: TRANSESOPHAGEAL ECHOCARDIOGRAM (TEE);  Surgeon: Donata Clay, Theron Arista, MD;  Location: [facility] OR;  Service: Open Heart Surgery;  Laterality: N/A;   TEE WITHOUT CARDIOVERSION N/A 11/03/2019   Procedure: TRANSESOPHAGEAL ECHOCARDIOGRAM (TEE);  Surgeon: Donata Clay, Theron Arista, MD;  Location: [facility] OR;  Service: Open Heart Surgery;  Laterality: N/A;   Past Medical History:  Diagnosis Date   Alcohol abuse    Anxiety    CAD (coronary artery disease)    Cardiac arrest (HCC)    Cardiogenic shock (HCC)    CHF (congestive heart failure) (HCC)    Cough    Dysrhythmia    GERD (gastroesophageal reflux disease)    HFrEF (heart failure with reduced ejection fraction) (HCC)    Hypertension    Hypertriglyceridemia    Ischemic cardiomyopathy    Right atrial thrombus    Tobacco abuse    There were no vitals taken for this visit.  Opioid Risk Score:   Fall Risk Score:  `1  Depression screen PHQ 2/9     04/06/2022    1:10 PM 12/19/2021    2:07 PM 11/18/2021    9:13 AM 10/21/2021    3:23 PM 09/19/2021    2:28 PM 08/19/2021    9:49 AM 06/26/2021    3:35 PM  Depression screen PHQ 2/9  Decreased Interest 0 0 0 0 0 0 0  Down, Depressed, Hopeless 0 0 0 0 0 0 0  PHQ - 2 Score 0 0 0 0 0 0 0     Review of Systems  Constitutional: Negative.   HENT: Negative.    Eyes: Negative.   Respiratory: Negative.    Cardiovascular: Negative.   Gastrointestinal: Negative.   Endocrine: Negative.   Genitourinary: Negative.   Musculoskeletal:  Positive for arthralgias, back pain and myalgias.  Skin: Negative.   Allergic/Immunologic: Negative.   Neurological:        Tingling  Hematological:  Bruises/bleeds easily.        Plavix  Psychiatric/Behavioral: Negative.    All other systems reviewed and are negative.      Objective:    Not performed      Assessment & Plan:  1. Left Shoulder Pain: Continie HEP as Tolerated. Continue current medication regimen. Continue to monitor. 11/22/2020  2.Left Greater Trochanter Bursitis: S/P Left Hip Injection with Dr Carlis Abbott on 09/03/2020, no relief noted he reports, Continue to alternate with Heat an Ice Therapy. Continue to monitor. 11/22/2020  3. Bilateral  Knees chondral defects: Continue HEP as Tolerated. Continue current medication regimen. 11/22/2020. Discussed results of bilateral knee MRIs which showed chondral deficits. Advised application of voltaren gel. Advised icing in evenings. Did not benefit from prior steroid taper. Referred to ortho for eval for arthroscopic debridement.  Discussed voltaren gel -discussed that pain could be secondary to opioid induced testoserone deficiency -testosterone level checked and it is is low. -Discussed the risks and benefits of testosterone replacement therapy and he  is interested in trying this  -PSA should be monitored while using testosterone replacement therapy as there is an increased risk of prostate cancer.  -recommended Wobenxyme -refilled oxycodone and gabapentin.  -Discussed following foods that may reduce pain: 1) Ginger (especially studied for arthritis)- reduce leukotriene production to decrease inflammation 2) Blueberries- high in phytonutrients that decrease inflammation 3) Salmon- marine omega-3s reduce joint swelling and pain 4) Pumpkin seeds- reduce inflammation 5) dark chocolate- reduces inflammation 6) turmeric- reduces inflammation 7) tart cherries - reduce pain and stiffness 8) extra virgin olive oil - its compound olecanthal helps to block prostaglandins  9) chili peppers- can be eaten or applied topically via capsaicin 10) mint- helpful for headache, muscle aches, joint pain, and itching 11)  garlic- reduces inflammation  Link to further information on diet for chronic pain: http://www.bray.com/   3. Chest Wall Discomfort/ Neuropathic Pain: Continue Gabapentin. Continue to Monitor. 11/22/2020. Prescribed lidocaine patch.  -Cymbalta 20mg  daily prescribed -Stop Topamax.  -encouraged follow-up with cardiologist -continue nitroglycerin PRN -continue Percocet -Discussed Qutenza as an option for neuropathic pain control. Discussed that this is a capsaicin patch, stronger than capsaicin cream. Discussed that it is currently approved for diabetic peripheral neuropathy and post-herpetic neuralgia, but that it has also shown benefit in treating other forms of neuropathy. Provided patient with link to site to learn more about the patch: . Discussed that the patch would be placed in office and benefits usually last 3 months. Discussed that unintended exposure to capsaicin can cause severe irritation of eyes, mucous membranes, respiratory tract, and skin, but that Qutenza is a local treatment and does not have the systemic side effects of other nerve medications. Discussed that there may be pain, itching, erythema, and decreased sensory function associated with the application of Qutenza. Side effects usually subside within 1 week. A cold pack of analgesic medications can help with these side effects. Blood pressure can also be increased due to pain associated with administration of the patch.    -4. Chronic Pain Syndrome: :  continue Oxycodone 15/325 mg 6 times a day as needed for pain #180. We will continue the opioid monitoring program, this consists of regular clinic visits, examinations, urine drug screen, pill counts as well as use of https://www.clark.biz/ Controlled Substance Reporting system. A 12 month History has been reviewed on the [place] Controlled Substance Reporting System on 11/22/2020.  Discussed risks and benefits of opioid medications.  -Provided with a pain relief journal and discussed that it contains foods and lifestyle tips to naturally help to improve pain. Discussed that these lifestyle strategies are also very good for health unlike some medications which can have negative side effects. Discussed that the act of keeping a journal can be therapeutic and helpful to realize patterns what helps to trigger and alleviate pain.   -Discussed following foods that may reduce pain: 1) Ginger (especially studied for arthritis)- reduce leukotriene production to decrease inflammation 2) Blueberries- high in phytonutrients that decrease inflammation 3) Salmon- marine omega-3s reduce joint swelling and pain 4) Pumpkin seeds- reduce inflammation 5) dark chocolate- reduces inflammation 6) turmeric- reduces inflammation 7) tart cherries - reduce pain and stiffness 8) extra virgin olive oil - its compound olecanthal helps to block prostaglandins  9) chili peppers- can be eaten or applied topically via capsaicin 10) mint- helpful for headache, muscle aches, joint pain, and itching 11) garlic- reduces inflammation  Link to further information on diet for chronic pain: 01/23/2021   5) Insomnia: continue trazodone 50mg  HS  6) Low back pain: -Discussed results of MRI which shows disc degeneration, spinal stenosis, and bone marrow edema -discussed risks and benefits of extracorporeal shockwave therapy and will plan for this next visit.  -Given worsening pain and radiation of pain into legs intermittently with occasional weakness, new MRI ordered, will follow-up on this with April  -discussed that hemp products not recommended while receiving opioids as may show up as THC in his urine sample -discussed that muscle and ligament pathology can also cause severe pain at times -encouraged continued exercise and  working to send blood flow to injured area and promote healing -Provided with a pain relief journal and discussed that it contains foods and lifestyle tips to naturally help to improve pain. Discussed that these lifestyle strategies are also very good for health unlike some medications which can have negative side effects. Discussed that the act of keeping a journal can be therapeutic and helpful to realize patterns what helps to trigger and alleviate pain.   -Wobenzyme -Discussed response to decrease in atorvastatin. Discussed that cholesterol is important for muscle function and lowering cholesterol levels too much is associated with joint pain in some patients. Discussed that some patients feel relief as soon as decreasing statin medications, whereas for others it takes a longer time to see if pain improves or not. Recommended repeating his lipid panel prior to when he is due for his next refill so that we make sure his LDL does not increase above 100.   7) Cervical radiuculitis: -Discussed that cervical XR showed disc degeneration  8) Testosterone deficiency Discussed that testosterone level has improved with his exercise and dietary changes -prescribed vitamin D  9) Trigger finger, left -recommended brace -if this does not help, recommend orthopedic follow-up for trigger point injection  10) Prostate cancer screening: -PSA check today.   11) HLD: -reviewed last lipid panel with him -discussed checking lipid panel with cardiologist.  -recommend decreasing atorvastatin to , but should discuss with cardiologist.   12. Suboptimal vitamin D -prescribed 50,000U ergocalciferol once per week for 7 weeks -now can continue over the counter D3  13) Trigger finger -sent him a picture of brace he can purchase on [place] -referred to surgery for steroid injection if brace fails to help  14) Insomnia -continue amitriptyline to  HS -Try to go outside near sunrise -Get exercise during  the day.  -Turn off all devices an hour before bedtime.  -Teas that can benefit: chamomile, valerian root, Brahmi (Bacopa) -Can consider over the counter melatonin, magnesium, and/or L-theanine. Melatonin is an anti-oxidant with multiple health benefits. Magnesium is involved in greater than 300 enzymatic reactions in the body and most of [place] are deficient as our soil is often depleted. There are 7 different types of magnesium- Bioptemizer's is a supplement with all 7 types, and each has unique benefits. Magnesium can also help with constipation and anxiety.  -Pistachios naturally increase the production of melatonin -[employer] bamboo bed sheets are free from toxic chemicals.  -Tart cherry juice or a tart cherry supplement can improve sleep and soreness post-workout

## 2022-05-05 NOTE — Addendum Note (Signed)
Addended by: Horton Chin on: 05/05/2022 04:08 PM   Modules accepted: Orders

## 2022-05-05 NOTE — Progress Notes (Signed)
Subjective:    Patient ID: William Crawford, male    DOB: 10-05-1970, 51 y.o.   MRN: [id number]  HPI   Pain Inventory Average Pain 8 Pain Right Now 6 My pain is constant, sharp, burning, stabbing, and aching  In the last 24 hours, has pain interfered with the following? General activity 6 Relation with others 4 Enjoyment of life 4 What TIME of day is your pain at its worst? morning , daytime, and night Sleep (in general) Fair  Pain is worse with: bending, inactivity, and some activites Pain improves with: rest, medication, and TENS Relief from Meds: 7  Family History  Problem Relation Age of Onset   Heart attack Father    Heart disease Father    Heart attack Paternal Grandfather    Heart disease Paternal Grandfather    Heart attack Paternal Uncle    Heart disease Paternal Uncle    Heart attack Paternal Uncle    Heart disease Paternal Uncle    Social History   Socioeconomic History   Marital status: Single    Spouse name: Not on file   Number of children: Not on file   Years of education: Not on file   Highest education level: Not on file  Occupational History   Not on file  Tobacco Use   Smoking status: Former    Types: Cigarettes    Quit date: 10/23/2019    Years since quitting: 2.5   Smokeless tobacco: Never  Vaping Use   Vaping Use: Never used  Substance and Sexual Activity   Alcohol use: Not Currently    Comment: QUIT  IN MAY 23762   Drug use: No   Sexual activity: Not on file  Other Topics Concern   Not on file  Social History Narrative   Not on file   Social Determinants of Health   Financial Resource Strain: Not on file  Food Insecurity: Not on file  Transportation Needs: Not on file  Physical Activity: Not on file  Stress: Not on file  Social Connections: Not on file   Past Surgical History:  Procedure Laterality Date   APPLICATION OF WOUND VAC  10/25/2019   Procedure: Application Of Wound Vac;  Surgeon: Kerin Perna, MD;   Location: [facility] OR;  Service: Open Heart Surgery;;   CANNULATION FOR ECMO (EXTRACORPOREAL MEMBRANE OXYGENATION)  10/25/2019   Procedure: Cannulation For Ecmo (Extracorporeal Membrane Oxygenation) @ 1531;  Surgeon: Donata Clay, Theron Arista, MD;  Location: [facility] OR;  Service: Open Heart Surgery;;   CANNULATION FOR ECMO (EXTRACORPOREAL MEMBRANE OXYGENATION) N/A 10/30/2019   Procedure: DECANNULATION OF ECMO (EXTRACORPOREAL MEMBRANE OXYGENATION);  Surgeon: Donata Clay, Theron Arista, MD;  Location: [facility] OR;  Service: Open Heart Surgery;  Laterality: N/A;  Pump standby   CHEST EXPLORATION  10/25/2019   Procedure: Chest Exploration for repair of coronary perforation (repair of LAD);  Surgeon: Donata Clay, Theron Arista, MD;  Location: [facility] OR;  Service: Open Heart Surgery;;   CORONARY CTO INTERVENTION N/A 10/25/2019   Procedure: CORONARY CTO INTERVENTION;  Surgeon: Swaziland, Peter M, MD;  Location: [facility] INVASIVE CV LAB;  Service: Cardiovascular;  Laterality: N/A;   CORONARY STENT INTERVENTION N/A 10/17/2019   Procedure: CORONARY STENT INTERVENTION;  Surgeon: Tonny Bollman, MD;  Location: [facility] INVASIVE CV LAB;  Service: Cardiovascular;  Laterality: N/A;   LEFT HEART CATH AND CORONARY ANGIOGRAPHY N/A 10/17/2019   Procedure: LEFT HEART CATH AND CORONARY ANGIOGRAPHY;  Surgeon: Tonny Bollman, MD;  Location: [facility] INVASIVE CV LAB;  Service: Cardiovascular;  Laterality: N/A;   MEDIASTERNOTOMY  10/25/2019   Procedure: Median Sternotomy.;  Surgeon: Kerin Perna, MD;  Location: [facility] OR;  Service: Open Heart Surgery;;   PLACEMENT OF IMPELLA LEFT VENTRICULAR ASSIST DEVICE  10/30/2019   Procedure: Placement Of Impella Left Ventricular Assist Device 5.5 USING HEMASHIELD PLATINUM GRAFT;  Surgeon: Kerin Perna, MD;  Location: [facility] OR;  Service: Open Heart Surgery;;  graft placed into aorta   REMOVAL OF IMPELLA LEFT VENTRICULAR ASSIST DEVICE N/A 11/03/2019   Procedure: REMOVAL OF IMPELLA 5.5 LEFT VENTRICULAR ASSIST DEVICE;  Surgeon: Kerin Perna, MD;  Location: [facility]  OR;  Service: Open Heart Surgery;  Laterality: N/A;   STERNAL CLOSURE N/A 10/30/2019   Procedure: Sternal Closure;  Surgeon: Kerin Perna, MD;  Location: [facility] OR;  Service: Open Heart Surgery;  Laterality: N/A;   STERNAL WIRES REMOVAL N/A 05/14/2020   Procedure: STERNAL WIRES REMOVAL;  Surgeon: Kerin Perna, MD;  Location: [facility] OR;  Service: Thoracic;  Laterality: N/A;   TEE WITHOUT CARDIOVERSION N/A 10/25/2019   Procedure: TRANSESOPHAGEAL ECHOCARDIOGRAM (TEE);  Surgeon: Donata Clay, Theron Arista, MD;  Location: [facility] OR;  Service: Open Heart Surgery;  Laterality: N/A;   TEE WITHOUT CARDIOVERSION N/A 10/30/2019   Procedure: TRANSESOPHAGEAL ECHOCARDIOGRAM (TEE);  Surgeon: Donata Clay, Theron Arista, MD;  Location: [facility] OR;  Service: Open Heart Surgery;  Laterality: N/A;   TEE WITHOUT CARDIOVERSION N/A 11/03/2019   Procedure: TRANSESOPHAGEAL ECHOCARDIOGRAM (TEE);  Surgeon: Donata Clay, Theron Arista, MD;  Location: [facility] OR;  Service: Open Heart Surgery;  Laterality: N/A;   Past Surgical History:  Procedure Laterality Date   APPLICATION OF WOUND VAC  10/25/2019   Procedure: Application Of Wound Vac;  Surgeon: Kerin Perna, MD;  Location: [facility] OR;  Service: Open Heart Surgery;;   CANNULATION FOR ECMO (EXTRACORPOREAL MEMBRANE OXYGENATION)  10/25/2019   Procedure: Cannulation For Ecmo (Extracorporeal Membrane Oxygenation) @ 1531;  Surgeon: Kerin Perna, MD;  Location: [facility] OR;  Service: Open Heart Surgery;;   CANNULATION FOR ECMO (EXTRACORPOREAL MEMBRANE OXYGENATION) N/A 10/30/2019   Procedure: DECANNULATION OF ECMO (EXTRACORPOREAL MEMBRANE OXYGENATION);  Surgeon: Donata Clay, Theron Arista, MD;  Location: [facility] OR;  Service: Open Heart Surgery;  Laterality: N/A;  Pump standby   CHEST EXPLORATION  10/25/2019   Procedure: Chest Exploration for repair of coronary perforation (repair of LAD);  Surgeon: Donata Clay, Theron Arista, MD;  Location[facility] OR;  Service: Open Heart Surgery;;   CORONARY CTO INTERVENTION N/A 10/25/2019   Procedure: CORONARY CTO INTERVENTION;   Surgeon: Swaziland, Peter M, MD;  Location: [facility] INVASIVE CV LAB;  Service: Cardiovascular;  Laterality: N/A;   CORONARY STENT INTERVENTION N/A 10/17/2019   Procedure: CORONARY STENT INTERVENTION;  Surgeon: Tonny Bollman, MD;  Location: [facility] INVASIVE CV LAB;  Service: Cardiovascular;  Laterality: N/A;   LEFT HEART CATH AND CORONARY ANGIOGRAPHY N/A 10/17/2019   Procedure: LEFT HEART CATH AND CORONARY ANGIOGRAPHY;  Surgeon: Tonny Bollman, MD;  Location: [facility] INVASIVE CV LAB;  Service: Cardiovascular;  Laterality: N/A;   MEDIASTERNOTOMY  10/25/2019   Procedure: Median Sternotomy.;  Surgeon: Kerin Perna, MD;  Location: [facility] OR;  Service: Open Heart Surgery;;   PLACEMENT OF IMPELLA LEFT VENTRICULAR ASSIST DEVICE  10/30/2019   Procedure: Placement Of Impella Left Ventricular Assist Device 5.5 USING HEMASHIELD PLATINUM GRAFT;  Surgeon: Kerin Perna, MD;  Location: [facility] OR;  Service: Open Heart Surgery;;  graft placed into aorta   REMOVAL OF IMPELLA LEFT VENTRICULAR ASSIST DEVICE N/A 11/03/2019   Procedure: REMOVAL  OF IMPELLA 5.5 LEFT VENTRICULAR ASSIST DEVICE;  Surgeon: Kerin Perna, MD;  Location: [facility] OR;  Service: Open Heart Surgery;  Laterality: N/A;   STERNAL CLOSURE N/A 10/30/2019   Procedure: Sternal Closure;  Surgeon: Kerin Perna, MD;  Location: [facility] OR;  Service: Open Heart Surgery;  Laterality: N/A;   STERNAL WIRES REMOVAL N/A 05/14/2020   Procedure: STERNAL WIRES REMOVAL;  Surgeon: Kerin Perna, MD;  Location: [facility] OR;  Service: Thoracic;  Laterality: N/A;   TEE WITHOUT CARDIOVERSION N/A 10/25/2019   Procedure: TRANSESOPHAGEAL ECHOCARDIOGRAM (TEE);  Surgeon: Donata Clay, Theron Arista, MD;  Location: [facility] OR;  Service: Open Heart Surgery;  Laterality: N/A;   TEE WITHOUT CARDIOVERSION N/A 10/30/2019   Procedure: TRANSESOPHAGEAL ECHOCARDIOGRAM (TEE);  Surgeon: Donata Clay, Theron Arista, MD;  Location: [facility] OR;  Service: Open Heart Surgery;  Laterality: N/A;   TEE WITHOUT CARDIOVERSION N/A 11/03/2019   Procedure:  TRANSESOPHAGEAL ECHOCARDIOGRAM (TEE);  Surgeon: Donata Clay, Theron Arista, MD;  Location: [facility] OR;  Service: Open Heart Surgery;  Laterality: N/A;   Past Medical History:  Diagnosis Date   Alcohol abuse    Anxiety    CAD (coronary artery disease)    Cardiac arrest (HCC)    Cardiogenic shock (HCC)    CHF (congestive heart failure) (HCC)    Cough    Dysrhythmia    GERD (gastroesophageal reflux disease)    HFrEF (heart failure with reduced ejection fraction) (HCC)    Hypertension    Hypertriglyceridemia    Ischemic cardiomyopathy    Right atrial thrombus    Tobacco abuse    BP 139/89   Pulse 92   Ht 5\' 8"  (1.727 m)   Wt 182 lb (82.6 kg)   SpO2 99%   BMI 27.67 kg/m   Opioid Risk Score:   Fall Risk Score:  `1  Depression screen PHQ 2/9     05/05/2022    2:30 PM 04/06/2022    1:10 PM 12/19/2021    2:07 PM 11/18/2021    9:13 AM 10/21/2021    3:23 PM 09/19/2021    2:28 PM 08/19/2021    9:49 AM  Depression screen PHQ 2/9  Decreased Interest 0 0 0 0 0 0 0  Down, Depressed, Hopeless 0 0 0 0 0 0 0  PHQ - 2 Score 0 0 0 0 0 0 0    Review of Systems  Musculoskeletal:  Positive for back pain.       Right shoulder pain, pain in both knees  All other systems reviewed and are negative.      Objective:   Physical Exam        Assessment & Plan:

## 2022-05-18 ENCOUNTER — Other Ambulatory Visit (HOSPITAL_COMMUNITY): Payer: Self-pay | Admitting: Cardiology

## 2022-05-25 ENCOUNTER — Other Ambulatory Visit: Payer: Self-pay | Admitting: Physical Medicine and Rehabilitation

## 2022-05-27 NOTE — Telephone Encounter (Signed)
error 

## 2022-05-29 ENCOUNTER — Other Ambulatory Visit: Payer: Self-pay | Admitting: Physical Medicine and Rehabilitation

## 2022-05-29 MED ORDER — OXYCODONE HCL 15 MG PO TABS: 15.0000 mg | ORAL_TABLET | Freq: Four times a day (QID) | ORAL | 0 refills | Status: DC | PRN

## 2022-06-01 ENCOUNTER — Telehealth: Payer: Self-pay | Admitting: Physical Medicine and Rehabilitation

## 2022-06-01 NOTE — Telephone Encounter (Signed)
Patient left message he needs his prior authorization on his medication today.

## 2022-06-02 ENCOUNTER — Other Ambulatory Visit: Payer: Self-pay | Admitting: Physical Medicine and Rehabilitation

## 2022-06-02 MED ORDER — OXYCODONE HCL 15 MG PO TABS: 15.0000 mg | ORAL_TABLET | Freq: Four times a day (QID) | ORAL | 0 refills | Status: DC | PRN

## 2022-06-02 NOTE — Telephone Encounter (Signed)
A prior authorization is in progress by fax. William Crawford may get a 7 day supply of Oxycodone 15 MG. He will need a new Rx sent to the pharmacy for the total script. Because he is using a new insurance.

## 2022-06-05 ENCOUNTER — Encounter: Payer: 59 | Attending: Physical Medicine and Rehabilitation | Admitting: Registered Nurse

## 2022-06-05 ENCOUNTER — Encounter: Payer: Self-pay | Admitting: Registered Nurse

## 2022-06-05 ENCOUNTER — Telehealth: Payer: Self-pay

## 2022-06-05 VITALS — BP 125/83 | HR 81 | Ht 68.0 in | Wt 177.0 lb

## 2022-06-05 DIAGNOSIS — G894 Chronic pain syndrome: Secondary | ICD-10-CM | POA: Diagnosis not present

## 2022-06-05 DIAGNOSIS — M170 Bilateral primary osteoarthritis of knee: Secondary | ICD-10-CM | POA: Diagnosis not present

## 2022-06-05 DIAGNOSIS — G8929 Other chronic pain: Secondary | ICD-10-CM

## 2022-06-05 DIAGNOSIS — Z5181 Encounter for therapeutic drug level monitoring: Secondary | ICD-10-CM

## 2022-06-05 DIAGNOSIS — M25511 Pain in right shoulder: Secondary | ICD-10-CM

## 2022-06-05 DIAGNOSIS — M48062 Spinal stenosis, lumbar region with neurogenic claudication: Secondary | ICD-10-CM | POA: Diagnosis not present

## 2022-06-05 DIAGNOSIS — Z79899 Other long term (current) drug therapy: Secondary | ICD-10-CM

## 2022-06-05 MED ORDER — OXYCODONE HCL 15 MG PO TABS: 15.0000 mg | ORAL_TABLET | Freq: Four times a day (QID) | ORAL | 0 refills | Status: DC | PRN

## 2022-06-05 NOTE — Progress Notes (Signed)
Subjective:    Patient ID: William Crawford, male    DOB: 1970/12/13, 52 y.o.   MRN: [id number]  HPI: TRESEAN MATTIX is a 52 y.o. male who returns for follow up appointment for chronic pain and medication refill. He states his pain is located in his Right shoulder, lower back and ilateral knee pain. Marland Kitchen He rates his pain 7. His current exercise regime is walking and performing stretching exercises.  Mr. Bur Morphine equivalent is 90.00 MME.   Last UDS was Performed on 04/06/2022, it was consistent.    Pain Inventory Average Pain 8 Pain Right Now 7 My pain is constant, sharp, burning, stabbing, and aching  In the last 24 hours, has pain interfered with the following? General activity 8 Relation with others 8 Enjoyment of life 8 What TIME of day is your pain at its worst? morning , evening, and night Sleep (in general) Fair  Pain is worse with: walking, bending, sitting, inactivity, standing, and some activites Pain improves with: rest and medication Relief from Meds: 7  Family History  Problem Relation Age of Onset   Heart attack Father    Heart disease Father    Heart attack Paternal Grandfather    Heart disease Paternal Grandfather    Heart attack Paternal Uncle    Heart disease Paternal Uncle    Heart attack Paternal Uncle    Heart disease Paternal Uncle    Social History   Socioeconomic History   Marital status: Single    Spouse name: Not on file   Number of children: Not on file   Years of education: Not on file   Highest education level: Not on file  Occupational History   Not on file  Tobacco Use   Smoking status: Former    Types: Cigarettes    Quit date: 10/23/2019    Years since quitting: 2.6   Smokeless tobacco: Never  Vaping Use   Vaping Use: Never used  Substance and Sexual Activity   Alcohol use: Not Currently    Comment: QUIT  IN MAY 09983   Drug use: No   Sexual activity: Not on file  Other Topics Concern   Not on file  Social History  Narrative   Not on file   Social Determinants of Health   Financial Resource Strain: Not on file  Food Insecurity: Not on file  Transportation Needs: Not on file  Physical Activity: Not on file  Stress: Not on file  Social Connections: Not on file   Past Surgical History:  Procedure Laterality Date   APPLICATION OF WOUND VAC  10/25/2019   Procedure: Application Of Wound Vac;  Surgeon: [name], MD;  Location: [place];  Service: Open Heart Surgery;;   CANNULATION FOR ECMO (EXTRACORPOREAL MEMBRANE OXYGENATION)  10/25/2019   Procedure: Cannulation For Ecmo (Extracorporeal Membrane Oxygenation) @ 1531;  Surgeon: Prescott Gum, Collier Salina, MD;  Location: [place];  Service: Open Heart Surgery;;   CANNULATION FOR ECMO (EXTRACORPOREAL MEMBRANE OXYGENATION) N/A 10/30/2019   Procedure: DECANNULATION OF ECMO (EXTRACORPOREAL MEMBRANE OXYGENATION);  Surgeon: Prescott Gum, Collier Salina, MD;  Location: [place];  Service: Open Heart Surgery;  Laterality: N/A;  Pump standby   CHEST EXPLORATION  10/25/2019   Procedure: Chest Exploration for repair of coronary perforation (repair of LAD);  Surgeon: Prescott Gum, Collier Salina, MD;  Location: [place];  Service: Open Heart Surgery;;   CORONARY CTO INTERVENTION N/A 10/25/2019   Procedure: CORONARY CTO INTERVENTION;  Surgeon: Martinique, Peter M, MD;  Location: [facility] INVASIVE CV LAB;  Service: Cardiovascular;  Laterality: N/A;   CORONARY STENT INTERVENTION N/A 10/17/2019   Procedure: CORONARY STENT INTERVENTION;  Surgeon: Tonny Bollman, MD;  Location: [facility] INVASIVE CV LAB;  Service: Cardiovascular;  Laterality: N/A;   LEFT HEART CATH AND CORONARY ANGIOGRAPHY N/A 10/17/2019   Procedure: LEFT HEART CATH AND CORONARY ANGIOGRAPHY;  Surgeon: Tonny Bollman, MD;  Location: [facility] INVASIVE CV LAB;  Service: Cardiovascular;  Laterality: N/A;   MEDIASTERNOTOMY  10/25/2019   Procedure: Median Sternotomy.;  Surgeon: Kerin Perna, MD;  Location: [facility] OR;  Service: Open Heart Surgery;;   PLACEMENT OF IMPELLA LEFT  VENTRICULAR ASSIST DEVICE  10/30/2019   Procedure: Placement Of Impella Left Ventricular Assist Device 5.5 USING HEMASHIELD PLATINUM GRAFT;  Surgeon: Kerin Perna, MD;  Location: [facility] OR;  Service: Open Heart Surgery;;  graft placed into aorta   REMOVAL OF IMPELLA LEFT VENTRICULAR ASSIST DEVICE N/A 11/03/2019   Procedure: REMOVAL OF IMPELLA 5.5 LEFT VENTRICULAR ASSIST DEVICE;  Surgeon: Kerin Perna, MD;  Location: [facility] OR;  Service: Open Heart Surgery;  Laterality: N/A;   STERNAL CLOSURE N/A 10/30/2019   Procedure: Sternal Closure;  Surgeon: Kerin Perna, MD;  Location: [facility] OR;  Service: Open Heart Surgery;  Laterality: N/A;   STERNAL WIRES REMOVAL N/A 05/14/2020   Procedure: STERNAL WIRES REMOVAL;  Surgeon: Kerin Perna, MD;  Location: [facility]  OR;  Service: Thoracic;  Laterality: N/A;   TEE WITHOUT CARDIOVERSION N/A 10/25/2019   Procedure: TRANSESOPHAGEAL ECHOCARDIOGRAM (TEE);  Surgeon: Donata Clay, Theron Arista, MD;  Location: [facility] OR;  Service: Open Heart Surgery;  Laterality: N/A;   TEE WITHOUT CARDIOVERSION N/A 10/30/2019   Procedure: TRANSESOPHAGEAL ECHOCARDIOGRAM (TEE);  Surgeon: Donata Clay, Theron Arista, MD;  Location: [facility] OR;  Service: Open Heart Surgery;  Laterality: N/A;   TEE WITHOUT CARDIOVERSION N/A 11/03/2019   Procedure: TRANSESOPHAGEAL ECHOCARDIOGRAM (TEE);  Surgeon: Donata Clay, Theron Arista, MD;  Location: [facility] OR;  Service: Open Heart Surgery;  Laterality: N/A;   Past Surgical History:  Procedure Laterality Date   APPLICATION OF WOUND VAC  10/25/2019   Procedure: Application Of Wound Vac;  Surgeon: Kerin Perna, MD;  Location: [facility] OR;  Service: Open Heart Surgery;;   CANNULATION FOR ECMO (EXTRACORPOREAL MEMBRANE OXYGENATION)  10/25/2019   Procedure: Cannulation For Ecmo (Extracorporeal Membrane Oxygenation) @ 1531;  Surgeon: Kerin Perna, MD;  Location: [facility] OR;  Service: Open Heart Surgery;;   CANNULATION FOR ECMO (EXTRACORPOREAL MEMBRANE OXYGENATION) N/A 10/30/2019   Procedure: DECANNULATION OF ECMO  (EXTRACORPOREAL MEMBRANE OXYGENATION);  Surgeon: Donata Clay, Theron Arista, MD;  Location: [facility] OR;  Service: Open Heart Surgery;  Laterality: N/A;  Pump standby   CHEST EXPLORATION  10/25/2019   Procedure: Chest Exploration for repair of coronary perforation (repair of LAD);  Surgeon: Donata Clay, Theron Arista, MD;  Location: [facility] OR;  Service: Open Heart Surgery;;   CORONARY CTO INTERVENTION N/A 10/25/2019   Procedure: CORONARY CTO INTERVENTION;  Surgeon: Swaziland, Peter M, MD;  Location: [facility] INVASIVE CV LAB;  Service: Cardiovascular;  Laterality: N/A;   CORONARY STENT INTERVENTION N/A 10/17/2019   Procedure: CORONARY STENT INTERVENTION;  Surgeon: Tonny Bollman, MD;  Location: [facility] INVASIVE CV LAB;  Service: Cardiovascular;  Laterality: N/A;   LEFT HEART CATH AND CORONARY ANGIOGRAPHY N/A 10/17/2019   Procedure: LEFT HEART CATH AND CORONARY ANGIOGRAPHY;  Surgeon: Tonny Bollman, MD;  Location: [facility] INVASIVE CV LAB;  Service: Cardiovascular;  Laterality: N/A;   MEDIASTERNOTOMY  10/25/2019   Procedure: Median Sternotomy.;  Surgeon:  Kerin Perna, MD;  Location: [facility] OR;  Service: Open Heart Surgery;;   PLACEMENT OF IMPELLA LEFT VENTRICULAR ASSIST DEVICE  10/30/2019   Procedure: Placement Of Impella Left Ventricular Assist Device 5.5 USING HEMASHIELD PLATINUM GRAFT;  Surgeon: Kerin Perna, MD;  Location: [facility] OR;  Service: Open Heart Surgery;;  graft placed into aorta   REMOVAL OF IMPELLA LEFT VENTRICULAR ASSIST DEVICE N/A 11/03/2019   Procedure: REMOVAL OF IMPELLA 5.5 LEFT VENTRICULAR ASSIST DEVICE;  Surgeon: Kerin Perna, MD;  Location: [facility] OR;  Service: Open Heart Surgery;  Laterality: N/A;   STERNAL CLOSURE N/A 10/30/2019   Procedure: Sternal Closure;  Surgeon: Kerin Perna, MD;  Location: [facility] OR;  Service: Open Heart Surgery;  Laterality: N/A;   STERNAL WIRES REMOVAL N/A 05/14/2020   Procedure: STERNAL WIRES REMOVAL;  Surgeon: Kerin Perna, MD;  Location: [facility] OR;  Service: Thoracic;  Laterality: N/A;   TEE WITHOUT  CARDIOVERSION N/A 10/25/2019   Procedure: TRANSESOPHAGEAL ECHOCARDIOGRAM (TEE);  Surgeon: Donata Clay, Theron Arista, MD;  Location: [facility] OR;  Service: Open Heart Surgery;  Laterality: N/A;   TEE WITHOUT CARDIOVERSION N/A 10/30/2019   Procedure: TRANSESOPHAGEAL ECHOCARDIOGRAM (TEE);  Surgeon: Donata Clay, Theron Arista, MD;  Location: [facility] OR;  Service: Open Heart Surgery;  Laterality: N/A;   TEE WITHOUT CARDIOVERSION N/A 11/03/2019   Procedure: TRANSESOPHAGEAL ECHOCARDIOGRAM (TEE);  Surgeon: Donata Clay, Theron Arista, MD;  Location: [facility] OR;  Service: Open Heart Surgery;  Laterality: N/A;   Past Medical History:  Diagnosis Date   Alcohol abuse    Anxiety    CAD (coronary artery disease)    Cardiac arrest (HCC)    Cardiogenic shock (HCC)    CHF (congestive heart failure) (HCC)    Cough    Dysrhythmia    GERD (gastroesophageal reflux disease)    HFrEF (heart failure with reduced ejection fraction) (HCC)    Hypertension    Hypertriglyceridemia    Ischemic cardiomyopathy    Right atrial thrombus    Tobacco abuse    There were no vitals taken for this visit.  Opioid Risk Score:   Fall Risk Score:  `1  Depression screen PHQ 2/9     05/05/2022    2:30 PM 04/06/2022    1:10 PM 12/19/2021    2:07 PM 11/18/2021    9:13 AM 10/21/2021    3:23 PM 09/19/2021    2:28 PM 08/19/2021    9:49 AM  Depression screen PHQ 2/9  Decreased Interest 0 0 0 0 0 0 0  Down, Depressed, Hopeless 0 0 0 0 0 0 0  PHQ - 2 Score 0 0 0 0 0 0 0   r   Review of Systems  Musculoskeletal:  Positive for back pain.       Right shoulder pain B/L knee pain  All other systems reviewed and are negative.     Objective:   Physical Exam Vitals and nursing note reviewed.  Constitutional:      Appearance: Normal appearance.  Cardiovascular:     Rate and Rhythm: Normal rate and regular rhythm.     Pulses: Normal pulses.     Heart sounds: Normal heart sounds.  Pulmonary:     Effort: Pulmonary effort is normal.     Breath sounds: Normal breath  sounds.  Musculoskeletal:     Cervical back: Normal range of motion and neck supple.     Comments: Normal Muscle Bulk and Muscle Testing Reveals:  Upper Extremities: Full ROM and Muscle  Strength 5/5 Right AC Joint Tenderness Lumbar Paraspinal Tenderness: L-4-L-5 Lower Extremities: Full ROM and Muscle Strength 5/5 Arises from Table with ease Narrow Based  Gait     Skin:    General: Skin is warm and dry.  Neurological:     Mental Status: He is alert and oriented to person, place, and time.  Psychiatric:        Mood and Affect: Mood normal.        Behavior: Behavior normal.         Assessment & Plan:  1. Right  Shoulder Pain: Continue HEP as Tolerated. Continue current medication regimen. Continue to monitor. 06/05/2022 2.Left Greater Trochanter Bursitis: No complaints today. S/P Left Hip Injection with Dr Ranell Patrick on 09/03/2020, no relief noted he reports, Continue to alternate with Heat an Ice Therapy. Continue to monitor. 06/05/2022 3. Bilateral  Knees OA: Continue HEP as Tolerated. Continue current medication regimen. 06/05/2022 3. Chest Wall Discomfort/ Neuropathic Pain: No complaints today. Continue Gabapentin. Continue to Monitor. 06/05/2022 4.Chronic Low Back Pain without sciatica: Continue HEP and Continue current medication regimen. Continue to monitor. 06/05/2022 5. Chronic Pain Syndrome: : Refilled: Oxycodone 15 mg one tablet every 6 hours  as needed for pain #120. We will continue the opioid monitoring program, this consists of regular clinic visits, examinations, urine drug screen, pill counts as well as use of [place] Controlled Substance Reporting system. A 12 month History has been reviewed on the [place] Controlled Substance Reporting System on 06/05/2022.   F/U in 1 month

## 2022-06-05 NOTE — Telephone Encounter (Signed)
Per Alexia with [facility] patient has went through the initial 7 day supply, patient is able to get full 30 day supply on 06/08/22, [facility] just needs to process with [facility], no PA is required for this patient, verbal expressed while patient was in office.

## 2022-06-15 ENCOUNTER — Other Ambulatory Visit (HOSPITAL_COMMUNITY): Payer: Self-pay | Admitting: Cardiology

## 2022-06-29 ENCOUNTER — Other Ambulatory Visit (HOSPITAL_COMMUNITY): Payer: Self-pay

## 2022-06-30 ENCOUNTER — Encounter: Payer: 59 | Attending: Physical Medicine and Rehabilitation | Admitting: Physical Medicine and Rehabilitation

## 2022-06-30 VITALS — BP 109/70 | HR 89 | Ht 68.0 in | Wt 179.0 lb

## 2022-06-30 DIAGNOSIS — G4701 Insomnia due to medical condition: Secondary | ICD-10-CM | POA: Insufficient documentation

## 2022-06-30 DIAGNOSIS — M7062 Trochanteric bursitis, left hip: Secondary | ICD-10-CM | POA: Insufficient documentation

## 2022-06-30 DIAGNOSIS — G8929 Other chronic pain: Secondary | ICD-10-CM | POA: Diagnosis not present

## 2022-06-30 DIAGNOSIS — M5441 Lumbago with sciatica, right side: Secondary | ICD-10-CM | POA: Insufficient documentation

## 2022-06-30 DIAGNOSIS — M170 Bilateral primary osteoarthritis of knee: Secondary | ICD-10-CM | POA: Insufficient documentation

## 2022-06-30 DIAGNOSIS — M48062 Spinal stenosis, lumbar region with neurogenic claudication: Secondary | ICD-10-CM | POA: Diagnosis not present

## 2022-06-30 DIAGNOSIS — M25511 Pain in right shoulder: Secondary | ICD-10-CM | POA: Insufficient documentation

## 2022-06-30 DIAGNOSIS — M5442 Lumbago with sciatica, left side: Secondary | ICD-10-CM | POA: Insufficient documentation

## 2022-06-30 DIAGNOSIS — R0789 Other chest pain: Secondary | ICD-10-CM | POA: Insufficient documentation

## 2022-06-30 MED ORDER — AMITRIPTYLINE HCL 25 MG PO TABS: 25.0000 mg | ORAL_TABLET | Freq: Every day | ORAL | 3 refills | Status: DC

## 2022-06-30 MED ORDER — OXYCODONE HCL 15 MG PO TABS: 15.0000 mg | ORAL_TABLET | Freq: Four times a day (QID) | ORAL | 0 refills | Status: DC | PRN

## 2022-06-30 NOTE — Patient Instructions (Addendum)
Insomnia: -Try to go outside near sunrise -Get exercise during the day.  -Turn off all devices an hour before bedtime.  -Teas that can benefit: chamomile, valerian root, Brahmi (Bacopa) -Can consider over the counter melatonin, magnesium, and/or L-theanine. Melatonin is an anti-oxidant with multiple health benefits. Magnesium is involved in greater than 300 enzymatic reactions in the body and most of [place] are deficient as our soil is often depleted. There are 7 different types of magnesium- Bioptemizer's is a supplement with all 7 types, and each has unique benefits. Magnesium can also help with constipation and anxiety.  -Pistachios naturally increase the production of melatonin -[employer] bamboo bed sheets are free from toxic chemicals.  -Tart cherry juice or a tart cherry supplement can improve sleep and soreness post-workout    Foods that can relieve pain: 1) Ginger (especially studied for arthritis)- reduce leukotriene production to decrease inflammation 2) Blueberries- high in phytonutrients that decrease inflammation 3) Salmon- marine omega-3s reduce joint swelling and pain 4) Pumpkin seeds- reduce inflammation 5) dark chocolate- reduces inflammation 6) turmeric- reduces inflammation 7) tart cherries - reduce pain and stiffness 8) extra virgin olive oil - its compound olecanthal helps to block prostaglandins  9) chili peppers- can be eaten or applied topically via capsaicin 10) mint- helpful for headache, muscle aches, joint pain, and itching 11) garlic- reduces inflammation  Link to further information on diet for chronic pain: [email or web address]

## 2022-06-30 NOTE — Progress Notes (Signed)
Subjective:    Patient ID: William Crawford, male    DOB: [birth year], 52 y.o.   MRN: [id number]   William Crawford is a 52 y.o. male who presents for f/u of chronic low back pain, bilateral knee pain, chest pain, trigger finger, testosterone deficiency, and neck pain.   1) Chest pain -this dose of medicine is helping -no chest pain recently -this felt hot, burning, stabbing when it happened -the only way he could get by was when he tried to stand still -it felt like eternity.  -laid off regular job recently as his employer was running out of money -the nitroglycerin he has been carrying around but has not needed it, symptoms have improved -he was able to get another full time job -after several months of no chest pain, patient reports a recurrence of chest pain this week -previously his chest pain had resolved after Qutenza administration -he asks why it may have started again. -has not been present since he last called me -currently pain is worse in both knees and his low back. He cannot say whether the testosterone supplement helps.  -he asks whether he should work with a [employer] -has affected his ability to work, but he pushes through, but he has been wondering whether to pursue a different job. He has been working at his current job for a long time and it is hard on his back and knees. He does feel that asking for accommodations- particularly less times on his knees would help. He does use knee pads to support his knees.  -he wanted to pursue the Qutenza patch but it is not covered by his new insurance plan. -back pain has been worsening -he would benefit from a letter with work restrictions -he would like to pursue MRI, has not yet heard about this.   2) Knee, back, and shoulder pain -his chiropractor used a magnetic machine that was made by [facility], it helped tremendously, the inflammation in his back improved.  -feels like knees are getting worse -standing up  after sitting for a prolonged time seems to help -PT helped a little bit -he takes Gabapentin. He takes 800mg  TID. He thinks this helps with his opioid.  -has been using  electrical stimulator and that really helps -he asks what else he can take during the day  -His pain medication does not last as long as he would like,  -some days his pain is worse.  -He gets electric type pain down the legs almost every day more down left side than right.  -he asks if he can use hemp seed oil -he discussed with his cardiologist decreasing his atorvastatin dose and it has been decreased to 20mg . He has not had any improvements in pain since this change, if fact pain has worsened -he had a busy day yesterday working on his parents' as they have an upcoming drive to and he wanted to make sure it was safe for them to drive their car. He feels that this may have contributed to his worsening pain -been ok -he would like to know his vitamin D and testosterone level results and discuss treatment for his trigger finger -qutenza has helped minimize his chest pain, it is now gone. -had a death in the family- uncle had a pneumonia. He lives in [place]. He died before he could leave work. Her aunt is taking this pretty hard. He was really close to him. They used to talk 2-3 times per  week.  -The knee pain is located just under the knee cap and extends medially to laterally -He is feeling much better this week and has been able to work, but still having significant knee and low back pain -Also has chest pain at times- sharp and lasts three minutes when he has it.  -Pain has grown excruciating but he has been able to return to work as he needs to financially. -Discussed results of his bilateral knee MRI, his neck XR, and his back XR -He continues to have chest pain and had recent follow-up with cardiology. Would still like to try the Qutenza patch -the Cymbalta did not help.  -he requests if oxycodone  can be increased to 6 tablets per day.  -does not note radiation of pain into legs, but legs have given out dur to pain causing near fall -he does not note loss of bowel or bladder function -does note constipation -also having shooting pain down both arms into pinky fingers -discussed that bilateral knee pain and back pain could potentially be related to opioid-induced testosterone deficiency and he is agreeable to checking testosterone levels -he is also interested in ortho follow-up for his knee pain -he is feeling better today and was able to go to work -had burning pain over the weekend from the Qutenza that is now gone. Forgot to use the cooling gel. -he has never had PSA checked, discussed that result is 0.4 which is low, so we could trial testosterone patch given his deficiency and symptoms if he would like. He is agreeable and I have sent to his pharmacy.   Prior history: He states his pain is located in his left shoulder, lower back pain, left hip and bilateral knee pain. He was in contact with Dr William Crawford regarding his increase intensity of pain, per Dr William Crawford note his oxycodone will be increased to 5 times a day as needed for pain. Mr. William Crawford verbalizes understanding.  He rates his pain 4. His current exercise regime is walking and performing stretching exercises.  William Crawford Morphine equivalent is 60.00  MME.   Last UDs was Performed on 08/28/2020, it was consistent.   He is having a good summer.   He fell down and hit his right ankle and got a big bruise- not hurting too bad.   He had a good time with his grandson riding rolling coasters at [facility] [place]  His chest pain returned and it feels sore and he plans to f/u with his surgeon regarding the pain. It feels sharp, hot, like a burning He takes Gabapentin 600mg - but that doesn't help unless used with the oxycodone.  2) Currently his pain is worse in bilateral knees- it is worst when he kneels, climbs ladders. He has a  supportive community at work which allow him to limit his kneeling and he has been using pads when he kneels. His oxycodone is now only lasting him about 2.5 hours after he takes it and he has been taking them 5 times per day. He asks if there is anything stronger he can take. He has tried Voltaren gel in the past and its effect usually started an hour after application.  -got compression sleeves to apply to his knees and has been able to work -at times his knee still buckles and this is painful -he feels he is getting used to the pain with walking but as far as climbing he feels it a lot more -he got a steroid injection by orthopedics and did  find that it helped but did not last very long.  -he has been trying to eat more foods that reduce inflammation -this often keeps him up at night -he asks whether he can take cayenne peppers -wind worsens the joint pain  Low testosterone:  -he bought an over the counter supplement since the patch was not covered by insurance -takes magnesium, B1, zinc, vitamin D.      Pain Inventory Average Pain 7 Pain Right Now 9 My pain is constant, sharp, burning, stabbing, and aching  In the last 24 hours, has pain interfered with the following? General activity 8 Relation with others 0 Enjoyment of life 2 What TIME of day is your pain at its worst? morning , daytime, and evening Sleep (in general) Poor  Pain is worse with: walking, bending, sitting, and some activites Pain improves with: medication Relief from Meds: 5  Family History  Problem Relation Age of Onset   Heart attack Father    Heart disease Father    Heart attack Paternal Grandfather    Heart disease Paternal Grandfather    Heart attack Paternal Uncle    Heart disease Paternal Uncle    Heart attack Paternal Uncle    Heart disease Paternal Uncle    Social History   Socioeconomic History   Marital status: Single    Spouse name: Not on file   Number of children: Not on file   Years  of education: Not on file   Highest education level: Not on file  Occupational History   Not on file  Tobacco Use   Smoking status: Former    Types: Cigarettes    Quit date: 10/23/2019    Years since quitting: 2.6   Smokeless tobacco: Never  Vaping Use   Vaping Use: Never used  Substance and Sexual Activity   Alcohol use: Not Currently    Comment: QUIT  IN MAY 68088   Drug use: No   Sexual activity: Not on file  Other Topics Concern   Not on file  Social History Narrative   Not on file   Social Determinants of Health   Financial Resource Strain: Not on file  Food Insecurity: Not on file  Transportation Needs: Not on file  Physical Activity: Not on file  Stress: Not on file  Social Connections: Not on file   Past Surgical History:  Procedure Laterality Date   APPLICATION OF WOUND VAC  10/25/2019   Procedure: Application Of Wound Vac;  Surgeon: Kerin Perna, MD;  [facility];  Service: Open Heart Surgery;;   CANNULATION FOR ECMO (EXTRACORPOREAL MEMBRANE OXYGENATION)  10/25/2019   Procedure: Cannulation For Ecmo (Extracorporeal Membrane Oxygenation) @ 1531;  Surgeon: Donata Clay, Theron Arista, MD;  Location: [facility] OR;  Service: Open Heart Surgery;;   CANNULATION FOR ECMO (EXTRACORPOREAL MEMBRANE OXYGENATION) N/A 10/30/2019   Procedure: DECANNULATION OF ECMO (EXTRACORPOREAL MEMBRANE OXYGENATION);  Surgeon: Donata Clay, Theron Arista, MD;  Location: [facility] OR;  Service: Open Heart Surgery;  Laterality: N/A;  Pump standby   CHEST EXPLORATION  10/25/2019   Procedure: Chest Exploration for repair of coronary perforation (repair of LAD);  Surgeon: Donata Clay, Theron Arista, MD;  Location: [facility] OR;  Service: Open Heart Surgery;;   CORONARY CTO INTERVENTION N/A 10/25/2019   Procedure: CORONARY CTO INTERVENTION;  Surgeon: Swaziland, Peter M, MD;  Location: [facility] INVASIVE CV LAB;  Service: Cardiovascular;  Laterality: N/A;   CORONARY STENT INTERVENTION N/A 10/17/2019   Procedure: CORONARY STENT INTERVENTION;  Surgeon: Tonny Bollman, MD;  Location: [place] CV LAB;  Service: Cardiovascular;  Laterality: N/A;   LEFT HEART CATH AND CORONARY ANGIOGRAPHY N/A 10/17/2019   Procedure: LEFT HEART CATH AND CORONARY ANGIOGRAPHY;  Surgeon: Sherren Mocha, MD;  Location: [place] CV LAB;  Service: Cardiovascular;  Laterality: N/A;   [place]  10/25/2019   Procedure: Median Sternotomy.;  Surgeon: [name], MD;  Location: [facility] OR;  Service: Open Heart Surgery;;   PLACEMENT OF IMPELLA LEFT VENTRICULAR ASSIST DEVICE  10/30/2019   Procedure: Placement Of Impella Left Ventricular Assist Device 5.5 USING 10MM HEMASHIELD PLATINUM GRAFT;  Surgeon: [name], MD;  Location: [place];  Service: Open Heart Surgery;;  graft placed into aorta   REMOVAL OF IMPELLA LEFT VENTRICULAR ASSIST DEVICE N/A 11/03/2019   Procedure: REMOVAL OF IMPELLA 5.5 LEFT VENTRICULAR ASSIST DEVICE;  Surgeon: [name], MD;  Location: [place];  Service: Open Heart Surgery;  Laterality: N/A;   STERNAL CLOSURE N/A 10/30/2019   Procedure: Sternal Closure;  Surgeon: [name], MD;  Location: [place];  Service: Open Heart Surgery;  Laterality: N/A;   STERNAL WIRES REMOVAL N/A 05/14/2020   Procedure: STERNAL WIRES REMOVAL;  Surgeon: [name], MD;  Location: [place];  Service: Thoracic;  Laterality: N/A;   TEE WITHOUT CARDIOVERSION N/A 10/25/2019   Procedure: TRANSESOPHAGEAL ECHOCARDIOGRAM (TEE);  Surgeon: Prescott Gum, Collier Salina, MD;  Location: [place];  Service: Open Heart Surgery;  Laterality: N/A;   TEE WITHOUT CARDIOVERSION N/A 10/30/2019   Procedure: TRANSESOPHAGEAL ECHOCARDIOGRAM (TEE);  Surgeon: Prescott Gum, Collier Salina, MD;  Location: [place];  Service: Open Heart Surgery;  Laterality: N/A;   TEE WITHOUT CARDIOVERSION N/A 11/03/2019   Procedure: TRANSESOPHAGEAL ECHOCARDIOGRAM (TEE);  Surgeon: Prescott Gum, Collier Salina, MD;  Location: [place];  Service: Open Heart Surgery;  Laterality: N/A;   Past Surgical History:  Procedure Laterality Date   APPLICATION OF WOUND  VAC  10/25/2019   Procedure: Application Of Wound Vac;  Surgeon: [name], MD;  Location: [place];  Service: Open Heart Surgery;;   CANNULATION FOR ECMO (EXTRACORPOREAL MEMBRANE OXYGENATION)  10/25/2019   Procedure: Cannulation For Ecmo (Extracorporeal Membrane Oxygenation) @ 4098;  Surgeon: [name], MD;  Location: [place];  Service: Open Heart Surgery;;   CANNULATION FOR ECMO (EXTRACORPOREAL MEMBRANE OXYGENATION) N/A 10/30/2019   Procedure: DECANNULATION OF ECMO (EXTRACORPOREAL MEMBRANE OXYGENATION);  Surgeon: Prescott Gum, Collier Salina, MD;  Location: [place];  Service: Open Heart Surgery;  Laterality: N/A;  Pump standby   CHEST EXPLORATION  10/25/2019   Procedure: Chest Exploration for repair of coronary perforation (repair of LAD);  Surgeon: Prescott Gum, Collier Salina, MD;  Location: [place];  Service: Open Heart Surgery;;   CORONARY CTO INTERVENTION N/A 10/25/2019   Procedure: CORONARY CTO INTERVENTION;  Surgeon: Martin[place], Peter M, MD;  Location: [place] CV LAB;  Service: Cardiovascular;  Laterality: N/A;   CORONARY STENT INTERVENTION N/A 10/17/2019   Procedure: CORONARY STENT INTERVENTION;  Surgeon: Sherren Mocha, MD;  Location: [place] CV LAB;  Service: Cardiovascular;  Laterality: N/A;   LEFT HEART CATH AND CORONARY ANGIOGRAPHY N/A 10/17/2019   Procedure: LEFT HEART CATH AND CORONARY ANGIOGRAPHY;  Surgeon: Sherren Mocha, MD;  Location: [place] CV LAB;  Service: Cardiovascular;  Laterality: N/A;   [place]  10/25/2019   Procedure: Median Sternotomy.;  Surgeon: [name], MD;  Location: [facility] OR;  Service: Open Heart Surgery;;   PLACEMENT OF IMPELLA LEFT VENTRICULAR ASSIST DEVICE  10/30/2019   Procedure: Placement Of Impella Left  Ventricular Assist Device 5.5 USING 10MM HEMASHIELD PLATINUM GRAFT;  Surgeon: Prescott Gum, Collier Salina, MD;  Location: [place];  Service: Open Heart Surgery;;  graft placed into aorta   REMOVAL OF IMPELLA LEFT VENTRICULAR ASSIST DEVICE N/A 11/03/2019   Procedure: REMOVAL OF  IMPELLA 5.5 LEFT VENTRICULAR ASSIST DEVICE;  Surgeon: [name], MD;  Location: [place];  Service: Open Heart Surgery;  Laterality: N/A;   STERNAL CLOSURE N/A 10/30/2019   Procedure: Sternal Closure;  Surgeon: [name], MD;  Location: [place];  Service: Open Heart Surgery;  Laterality: N/A;   STERNAL WIRES REMOVAL N/A 05/14/2020   Procedure: STERNAL WIRES REMOVAL;  Surgeon: [name], MD;  Location: [place];  Service: Thoracic;  Laterality: N/A;   TEE WITHOUT CARDIOVERSION N/A 10/25/2019   Procedure: TRANSESOPHAGEAL ECHOCARDIOGRAM (TEE);  Surgeon: Prescott Gum, Collier Salina, MD;  Location: [place];  Service: Open Heart Surgery;  Laterality: N/A;   TEE WITHOUT CARDIOVERSION N/A 10/30/2019   Procedure: TRANSESOPHAGEAL ECHOCARDIOGRAM (TEE);  Surgeon: Prescott Gum, Collier Salina, MD;  Location: [place];  Service: Open Heart Surgery;  Laterality: N/A;   TEE WITHOUT CARDIOVERSION N/A 11/03/2019   Procedure: TRANSESOPHAGEAL ECHOCARDIOGRAM (TEE);  Surgeon: Prescott Gum, Collier Salina, MD;  Location: [place];  Service: Open Heart Surgery;  Laterality: N/A;   Past Medical History:  Diagnosis Date   Alcohol abuse    Anxiety    CAD (coronary artery disease)    Cardiac arrest ([place])    Cardiogenic shock (HCC)    CHF (congestive heart failure) (HCC)    Cough    Dysrhythmia    GERD (gastroesophageal reflux disease)    HFrEF (heart failure with reduced ejection fraction) (HCC)    Hypertension    Hypertriglyceridemia    Ischemic cardiomyopathy    Right atrial thrombus    Tobacco abuse    There were no vitals taken for this visit.  Opioid Risk Score:   Fall Risk Score:  `1  Depression screen PHQ 2/9     06/05/2022    2:15 PM 05/05/2022    2:30 PM 04/06/2022    1:10 PM 12/19/2021    2:07 PM 11/18/2021    9:13 AM 10/21/2021    3:23 PM 09/19/2021    2:28 PM  Depression screen PHQ 2/9  Decreased Interest 0 0 0 0 0 0 0  Down, Depressed, Hopeless 0 0 0 0 0 0 0  PHQ - 2 Score 0 0 0 0 0 0 0     Review of Systems   Constitutional: Negative.   HENT: Negative.    Eyes: Negative.   Respiratory: Negative.    Cardiovascular: Negative.   Gastrointestinal: Negative.   Endocrine: Negative.   Genitourinary: Negative.   Musculoskeletal:  Positive for arthralgias, back pain and myalgias.  Skin: Negative.   Allergic/Immunologic: Negative.   Neurological:        Tingling  Hematological:  Bruises/bleeds easily.       Plavix  Psychiatric/Behavioral: Negative.    All other systems reviewed and are negative.      Objective:    Gen: no distress, normal appearing HEENT: oral mucosa pink and moist, NCAT Cardio: Reg rate Chest: normal effort, normal rate of breathing Abd: soft, non-distended Ext: no edema Psych: pleasant, normal affect Skin: intact Neuro: Alert and oriented x3 MSK: bony sternal protrusion palpated      Assessment & Plan:  1. Left Shoulder Pain: Conitnue HEP as Tolerated. Continue current medication regimen. Continue to monitor. 11/22/2020  2.Left  Greater Trochanter Bursitis: S/P Left Hip Injection with Dr William Abbott on 09/03/2020, no relief noted he reports, Conitnue to alternate with Heat an Ice Therapy. Continue to monitor. 11/22/2020  3. Bilateral  Knees chondral defects: Conitnue HEP as Tolerated. Continue current medication regimen. 11/22/2020. Discussed results of bilateral knee MRIs which showed chondral deficits. Advised application of voltaren gel. Advised icing in evenings. Did not benefit from prior steroid taper. Referred to ortho for eval for arthroscopic debridement.  Discussed voltaren gel -discussed that pain could be secondary to opioid induced testoserone deficiency -testosterone level checked and it is is low. -Discussed the risks and benefits of testosterone replacement therapy and he is interested in trying this  -PSA should be monitored while using testosterone replacement therapy as there is an increased risk of prostate cancer.  -recommended Wobenxyme -refilled  oxycodone and gabapentin.  -Discussed following foods that may reduce pain: 1) Ginger (especially studied for arthritis)- reduce leukotriene production to decrease inflammation 2) Blueberries- high in phytonutrients that decrease inflammation 3) Salmon- marine omega-3s reduce joint swelling and pain 4) Pumpkin seeds- reduce inflammation 5) dark chocolate- reduces inflammation 6) turmeric- reduces inflammation 7) tart cherries - reduce pain and stiffness 8) extra virgin olive oil - its compound olecanthal helps to block prostaglandins  9) chili peppers- can be eaten or applied topically via capsaicin 10) mint- helpful for headache, muscle aches, joint pain, and itching 11) garlic- reduces inflammation  Link to further information on diet for chronic pain: http://www.bray.com/   3. Chest Wall Discomfort/ Neuropathic Pain: Conitnue Gabapentin. Continue to Monitor. 11/22/2020. Prescribed lidocaine patch.  -Cymbalta was stopped -Stop Topamax.  -encouraged follow-up with cardiologist -continue nitroglycerin PRN -continue Percocet -Discussed Qutenza as an option for neuropathic pain control. Discussed that this is a capsaicin patch, stronger than capsaicin cream. Discussed that it is currently approved for diabetic peripheral neuropathy and post-herpetic neuralgia, but that it has also shown benefit in treating other forms of neuropathy. Provided patient with link to site to learn more about the patch: https://www.clark.biz/. Discussed that the patch would be placed in office and benefits usually last 3 months. Discussed that unintended exposure to capsaicin can cause severe irritation of eyes, mucous membranes, respiratory tract, and skin, but that Qutenza is a local treatment and does not have the systemic side effects of other nerve medications. Discussed that there may be pain, itching, erythema, and decreased sensory  function associated with the application of Qutenza. Side effects usually subside within 1 week. A cold pack of analgesic medications can help with these side effects. Blood pressure can also be increased due to pain associated with administration of the patch.   -4. Chronic Pain Syndrome: :  continue Oxycodone 15/325 mg 6 times a day as needed for pain #180. We will continue the opioid monitoring program, this consists of regular clinic visits, examinations, urine drug screen, pill counts as well as use of [place] Controlled Substance Reporting system. A 12 month History has been reviewed on the [place] Controlled Substance Reporting System on 11/22/2020. Discussed risks and benefits of opioid medications.  -Provided with a pain relief journal and discussed that it contains foods and lifestyle tips to naturally help to improve pain. Discussed that these lifestyle strategies are also very good for health unlike some medications which can have negative side effects. Discussed that the act of keeping a journal can be therapeutic and helpful to realize patterns what helps to trigger and alleviate pain.   -Discussed following foods that may reduce  pain: 1) Ginger (especially studied for arthritis)- reduce leukotriene production to decrease inflammation 2) Blueberries- high in phytonutrients that decrease inflammation 3) Salmon- marine omega-3s reduce joint swelling and pain 4) Pumpkin seeds- reduce inflammation 5) dark chocolate- reduces inflammation 6) turmeric- reduces inflammation 7) tart cherries - reduce pain and stiffness 8) extra virgin olive oil - its compound olecanthal helps to block prostaglandins  9) chili peppers- can be eaten or applied topically via capsaicin 10) mint- helpful for headache, muscle aches, joint pain, and itching 11) garlic- reduces inflammation  Link to further information on diet for chronic pain:  http://www.randall.com/   5) Insomnia: increase amitriptyline to 25mg  HS, can try this instead of trazodone.   6) Low back pain: -Discussed current symptoms of pain and history of pain.  -Discussed benefits of exercise in reducing pain. -Discussed following foods that may reduce pain: 1) Ginger (especially studied for arthritis)- reduce leukotriene production to decrease inflammation 2) Blueberries- high in phytonutrients that decrease inflammation 3) Salmon- marine omega-3s reduce joint swelling and pain 4) Pumpkin seeds- reduce inflammation 5) dark chocolate- reduces inflammation 6) turmeric- reduces inflammation 7) tart cherries - reduce pain and stiffness 8) extra virgin olive oil - its compound olecanthal helps to block prostaglandins  9) chili peppers- can be eaten or applied topically via capsaicin 10) mint- helpful for headache, muscle aches, joint pain, and itching 11) garlic- reduces inflammation  Link to further information on diet for chronic pain: http://www.randall.com/  -Discussed results of MRI which shows disc degeneration, spinal stenosis, and bone marrow edema -discussed risks and benefits of extracorporeal shockwave therapy and will plan for this next visit.  -Given worsening pain and radiation of pain into legs intermittently with occasional weakness, new MRI ordered, will follow-up on this with April  -discussed that hemp products not recommended while receiving opioids as may show up as THC in his urine sample -discussed that muscle and ligament pathology can also cause severe pain at times -encouraged continued exercise and working to send blood flow to injured area and promote healing -Provided with a pain relief journal and discussed that it contains foods and lifestyle tips to naturally help to improve pain. Discussed that these  lifestyle strategies are also very good for health unlike some medications which can have negative side effects. Discussed that the act of keeping a journal can be therapeutic and helpful to realize patterns what helps to trigger and alleviate pain.   -Wobenzyme -Discussed response to decrease in atorvastatin. Discussed that cholesterol is important for muscle function and lowering cholesterol levels too much is associated with joint pain in some patients. Discussed that some patients feel relief as soon as decreasing statin medications, whereas for others it takes a longer time to see if pain improves or not. Recommended repeating his lipid panel prior to when he is due for his next refill so that we make sure his LDL does not increase above 100.   7) Cervical radiuculitis: -Discussed that cervical XR showed disc degeneration  8) Testosterone deficiency Discussed that testosterone level has improved with his exercise and dietary changes -prescribed vitamin D  9) Trigger finger, left -recommended brace -if this does not help, recommend orthopedic follow-up for trigger point injection  10) Prostate cancer screening: -PSA check today.   11) HLD: -reviewed last lipid panel with him -discussed checking lipid panel with cardiologist.  -recommend decreasing atorvastatin to 20mg , but should discuss with cardiologist.   12. Suboptimal vitamin D -prescribed 50,000U ergocalciferol once per week for  7 weeks -now can continue over the counter D3  13) Trigger finger -sent him a picture of brace he can purchase on [place] -referred to surgery for steroid injection if brace fails to help  14) Insomnia -increase amitriptyline 25mg  HS Insomnia: -Try to go outside near sunrise -Get exercise during the day.  -Turn off all devices an hour before bedtime.  -Teas that can benefit: chamomile, valerian root, Brahmi (Bacopa) -Can consider over the counter melatonin, magnesium, and/or L-theanine.  Melatonin is an anti-oxidant with multiple health benefits. Magnesium is involved in greater than 300 enzymatic reactions in the body and most of are deficient as our soil is often depleted. There are 7 different types of magnesium- Bioptemizer's is a supplement with all 7 types, and each has unique benefits. Magnesium can also help with constipation and anxiety.  -Pistachios naturally increase the production of melatonin -Cozy Earth bamboo bed sheets are free from toxic chemicals.  -Tart cherry juice or a tart cherry supplement can improve sleep and soreness post-workout

## 2022-07-11 ENCOUNTER — Other Ambulatory Visit (HOSPITAL_COMMUNITY): Payer: Self-pay | Admitting: Cardiology

## 2022-07-27 ENCOUNTER — Encounter: Payer: Medicaid Other | Attending: Physical Medicine and Rehabilitation | Admitting: Registered Nurse

## 2022-07-27 VITALS — BP 121/79 | HR 87 | Ht 68.0 in | Wt 180.0 lb

## 2022-07-27 DIAGNOSIS — M25572 Pain in left ankle and joints of left foot: Secondary | ICD-10-CM | POA: Insufficient documentation

## 2022-07-27 DIAGNOSIS — Z79899 Other long term (current) drug therapy: Secondary | ICD-10-CM | POA: Insufficient documentation

## 2022-07-27 DIAGNOSIS — G8929 Other chronic pain: Secondary | ICD-10-CM | POA: Diagnosis present

## 2022-07-27 DIAGNOSIS — M5441 Lumbago with sciatica, right side: Secondary | ICD-10-CM | POA: Insufficient documentation

## 2022-07-27 DIAGNOSIS — M25512 Pain in left shoulder: Secondary | ICD-10-CM | POA: Diagnosis present

## 2022-07-27 DIAGNOSIS — G894 Chronic pain syndrome: Secondary | ICD-10-CM | POA: Insufficient documentation

## 2022-07-27 DIAGNOSIS — M48062 Spinal stenosis, lumbar region with neurogenic claudication: Secondary | ICD-10-CM | POA: Insufficient documentation

## 2022-07-27 DIAGNOSIS — Z5181 Encounter for therapeutic drug level monitoring: Secondary | ICD-10-CM | POA: Diagnosis not present

## 2022-07-27 DIAGNOSIS — M170 Bilateral primary osteoarthritis of knee: Secondary | ICD-10-CM | POA: Insufficient documentation

## 2022-07-27 DIAGNOSIS — M5442 Lumbago with sciatica, left side: Secondary | ICD-10-CM | POA: Insufficient documentation

## 2022-07-27 MED ORDER — OXYCODONE HCL 15 MG PO TABS: 15.0000 mg | ORAL_TABLET | Freq: Four times a day (QID) | ORAL | 0 refills | Status: DC | PRN

## 2022-07-27 NOTE — Progress Notes (Signed)
Subjective:    Patient ID: William Crawford, male    DOB: 30-Apr-1971, 52 y.o.   MRN: [id number]  HPI: William Crawford is a 52 y.o. male who returns for follow up appointment for chronic pain and medication refill. He states his pain is located in his left shoulder, lower back, bilateral knees and left ankle. Marland Kitchen He rates his pain 5. His current exercise regime is walking and performing stretching exercises.  William Crawford Morphine equivalent is 90.00 MME.   UDS ordered today.    Pain Inventory Average Pain 8 Pain Right Now 5 My pain is sharp, burning, stabbing, tingling, and aching  In the last 24 hours, has pain interfered with the following? General activity 7 Relation with others 6 Enjoyment of life 6 What TIME of day is your pain at its worst? morning , daytime, evening, and night Sleep (in general) Poor  Pain is worse with: bending, sitting, inactivity, and some activites Pain improves with: rest, heat/ice, and medication Relief from Meds: 7  Family History  Problem Relation Age of Onset   Heart attack Father    Heart disease Father    Heart attack Paternal Grandfather    Heart disease Paternal Grandfather    Heart attack Paternal Uncle    Heart disease Paternal Uncle    Heart attack Paternal Uncle    Heart disease Paternal Uncle    Social History   Socioeconomic History   Marital status: Single    Spouse name: Not on file   Number of children: Not on file   Years of education: Not on file   Highest education level: Not on file  Occupational History   Not on file  Tobacco Use   Smoking status: Former    Types: Cigarettes    Quit date: 10/23/2019    Years since quitting: 2.7   Smokeless tobacco: Never  Vaping Use   Vaping Use: Never used  Substance and Sexual Activity   Alcohol use: Not Currently    Comment: QUIT  IN MAY 97673   Drug use: No   Sexual activity: Not on file  Other Topics Concern   Not on file  Social History Narrative   Not on file    Social Determinants of Health   Financial Resource Strain: Not on file  Food Insecurity: Not on file  Transportation Needs: Not on file  Physical Activity: Not on file  Stress: Not on file  Social Connections: Not on file   Past Surgical History:  Procedure Laterality Date   APPLICATION OF WOUND VAC  10/25/2019   Procedure: Application Of Wound Vac;  Surgeon: [name], MD;  Location: [facility] OR;  Service: Open Heart Surgery;;   CANNULATION FOR ECMO (EXTRACORPOREAL MEMBRANE OXYGENATION)  10/25/2019   Procedure: Cannulation For Ecmo (Extracorporeal Membrane Oxygenation) @ 1531;  Surgeon: Prescott Gum, Collier Salina, MD;  Location: [facility] OR;  Service: Open Heart Surgery;;   CANNULATION FOR ECMO (EXTRACORPOREAL MEMBRANE OXYGENATION) N/A 10/30/2019   Procedure: DECANNULATION OF ECMO (EXTRACORPOREAL MEMBRANE OXYGENATION);  Surgeon: Prescott Gum, Collier Salina, MD;  Location: [place];  Service: Open Heart Surgery;  Laterality: N/A;  Pump standby   CHEST EXPLORATION  10/25/2019   Procedure: Chest Exploration for repair of coronary perforation (repair of LAD);  Surgeon: Prescott Gum, Collier Salina, MD;  Location: [place];  Service: Open Heart Surgery;;   CORONARY CTO INTERVENTION N/A 10/25/2019   Procedure: CORONARY CTO INTERVENTION;  Surgeon: Martinique, Peter M, MD;  Location: [place] [facility];  Service: Cardiovascular;  Laterality: N/A;   CORONARY STENT INTERVENTION N/A 10/17/2019   Procedure: CORONARY STENT INTERVENTION;  Surgeon: Sherren Mocha, MD;  Location: [place] [facility];  Service: Cardiovascular;  Laterality: N/A;   LEFT HEART CATH AND CORONARY ANGIOGRAPHY N/A 10/17/2019   Procedure: LEFT HEART CATH AND CORONARY ANGIOGRAPHY;  Surgeon: Sherren Mocha, MD;  Location: [place] [facility];  Service: Cardiovascular;  Laterality: N/A;   [place]  10/25/2019   Procedure: Median Sternotomy.;  Surgeon: [name], MD;  Location: [facility] OR;  Service: Open Heart Surgery;;   PLACEMENT OF IMPELLA LEFT VENTRICULAR ASSIST DEVICE   10/30/2019   Procedure: Placement Of Impella Left Ventricular Assist Device 5.5 USING 10MM HEMASHIELD PLATINUM GRAFT;  Surgeon: [name], MD;  Location: [place];  Service: Open Heart Surgery;;  graft placed into aorta   REMOVAL OF IMPELLA LEFT VENTRICULAR ASSIST DEVICE N/A 11/03/2019   Procedure: REMOVAL OF IMPELLA 5.5 LEFT VENTRICULAR ASSIST DEVICE;  Surgeon: [name], MD;  Location: [place];  Service: Open Heart Surgery;  Laterality: N/A;   STERNAL CLOSURE N/A 10/30/2019   Procedure: Sternal Closure;  Surgeon: [name], MD;  Location: [place];  Service: Open Heart Surgery;  Laterality: N/A;   STERNAL WIRES REMOVAL N/A 05/14/2020   Procedure: STERNAL WIRES REMOVAL;  Surgeon: [name], MD;  Location: [place];  Service: Thoracic;  Laterality: N/A;   TEE WITHOUT CARDIOVERSION N/A 10/25/2019   Procedure: TRANSESOPHAGEAL ECHOCARDIOGRAM (TEE);  Surgeon: Prescott Gum, Collier Salina, MD;  Location: [place];  Service: Open Heart Surgery;  Laterality: N/A;   TEE WITHOUT CARDIOVERSION N/A 10/30/2019   Procedure: TRANSESOPHAGEAL ECHOCARDIOGRAM (TEE);  Surgeon: Prescott Gum, Collier Salina, MD;  Location: [place];  Service: Open Heart Surgery;  Laterality: N/A;   TEE WITHOUT CARDIOVERSION N/A 11/03/2019   Procedure: TRANSESOPHAGEAL ECHOCARDIOGRAM (TEE);  Surgeon: Prescott Gum, Collier Salina, MD;  Location: [place];  Service: Open Heart Surgery;  Laterality: N/A;   Past Surgical History:  Procedure Laterality Date   APPLICATION OF WOUND VAC  10/25/2019   Procedure: Application Of Wound Vac;  Surgeon: [name], MD;  Location: [place];  Service: Open Heart Surgery;;   CANNULATION FOR ECMO (EXTRACORPOREAL MEMBRANE OXYGENATION)  10/25/2019   Procedure: Cannulation For Ecmo (Extracorporeal Membrane Oxygenation) @ 2542;  Surgeon: [name], MD;  Location: [place];  Service: Open Heart Surgery;;   CANNULATION FOR ECMO (EXTRACORPOREAL MEMBRANE OXYGENATION) N/A 10/30/2019   Procedure: DECANNULATION OF ECMO (EXTRACORPOREAL MEMBRANE  OXYGENATION);  Surgeon: Prescott Gum, Collier Salina, MD;  Location: [place];  Service: Open Heart Surgery;  Laterality: N/A;  Pump standby   CHEST EXPLORATION  10/25/2019   Procedure: Chest Exploration for repair of coronary perforation (repair of LAD);  Surgeon: Prescott Gum, Collier Salina, MD;  Location: [place];  Service: Open Heart Surgery;;   CORONARY CTO INTERVENTION N/A 10/25/2019   Procedure: CORONARY CTO INTERVENTION;  Surgeon: Martinique, Peter M, MD;  Location: [place] [facility];  Service: Cardiovascular;  Laterality: N/A;   CORONARY STENT INTERVENTION N/A 10/17/2019   Procedure: CORONARY STENT INTERVENTION;  Surgeon: Sherren Mocha, MD;  Location: [place] [facility];  Service: Cardiovascular;  Laterality: N/A;   LEFT HEART CATH AND CORONARY ANGIOGRAPHY N/A 10/17/2019   Procedure: LEFT HEART CATH AND CORONARY ANGIOGRAPHY;  Surgeon: Sherren Mocha, MD;  Location: [place] [facility];  Service: Cardiovascular;  Laterality: N/A;   [place]  10/25/2019   Procedure: Median Sternotomy.;  Surgeon: [name], MD;  Location:  [facility] OR;  Service: Open Heart Surgery;;   PLACEMENT OF IMPELLA LEFT VENTRICULAR ASSIST DEVICE  10/30/2019   Procedure: Placement Of Impella Left Ventricular Assist Device 5.5 USING 10MM HEMASHIELD PLATINUM GRAFT;  Surgeon: [name], MD;  Location: [place];  Service: Open Heart Surgery;;  graft placed into aorta   REMOVAL OF IMPELLA LEFT VENTRICULAR ASSIST DEVICE N/A 11/03/2019   Procedure: REMOVAL OF IMPELLA 5.5 LEFT VENTRICULAR ASSIST DEVICE;  Surgeon: [name], MD;  Location: [place];  Service: Open Heart Surgery;  Laterality: N/A;   STERNAL CLOSURE N/A 10/30/2019   Procedure: Sternal Closure;  Surgeon: [name], MD;  Location: [place];  Service: Open Heart Surgery;  Laterality: N/A;   STERNAL WIRES REMOVAL N/A 05/14/2020   Procedure: STERNAL WIRES REMOVAL;  Surgeon: [name], MD;  Location: [place];  Service: Thoracic;  Laterality: N/A;   TEE WITHOUT CARDIOVERSION N/A 10/25/2019    Procedure: TRANSESOPHAGEAL ECHOCARDIOGRAM (TEE);  Surgeon: Prescott Gum, Collier Salina, MD;  Location: [place];  Service: Open Heart Surgery;  Laterality: N/A;   TEE WITHOUT CARDIOVERSION N/A 10/30/2019   Procedure: TRANSESOPHAGEAL ECHOCARDIOGRAM (TEE);  Surgeon: Prescott Gum, Collier Salina, MD;  Location: [place];  Service: Open Heart Surgery;  Laterality: N/A;   TEE WITHOUT CARDIOVERSION N/A 11/03/2019   Procedure: TRANSESOPHAGEAL ECHOCARDIOGRAM (TEE);  Surgeon: Prescott Gum, Collier Salina, MD;  Location: [place];  Service: Open Heart Surgery;  Laterality: N/A;   Past Medical History:  Diagnosis Date   Alcohol abuse    Anxiety    CAD (coronary artery disease)    Cardiac arrest ([place])    Cardiogenic shock (HCC)    CHF (congestive heart failure) (HCC)    Cough    Dysrhythmia    GERD (gastroesophageal reflux disease)    HFrEF (heart failure with reduced ejection fraction) (HCC)    Hypertension    Hypertriglyceridemia    Ischemic cardiomyopathy    Right atrial thrombus    Tobacco abuse    BP 121/79   Pulse 87   Ht 5\' 8"  (1.727 m)   Wt 180 lb (81.6 kg)   SpO2 98%   BMI 27.37 kg/m   Opioid Risk Score:   Fall Risk Score:  `1  Depression screen PHQ 2/9     06/05/2022    2:15 PM 05/05/2022    2:30 PM 04/06/2022    1:10 PM 12/19/2021    2:07 PM 11/18/2021    9:13 AM 10/21/2021    3:23 PM 09/19/2021    2:28 PM  Depression screen PHQ 2/9  Decreased Interest 0 0 0 0 0 0 0  Down, Depressed, Hopeless 0 0 0 0 0 0 0  PHQ - 2 Score 0 0 0 0 0 0 0       Review of Systems  Musculoskeletal:  Positive for back pain.       B/L knee shoulder LT foot  All other systems reviewed and are negative.      Objective:   Physical Exam Vitals and nursing note reviewed.  Constitutional:      Appearance: Normal appearance.  Cardiovascular:     Rate and Rhythm: Normal rate and regular rhythm.     Pulses: Normal pulses.     Heart sounds: Normal heart sounds.  Pulmonary:     Effort: Pulmonary effort is normal.     Breath  sounds: Normal breath sounds.  Musculoskeletal:     Cervical back: Normal range of motion and neck supple.  Comments: Normal Muscle Bulk and Muscle Testing Reveals:  Upper Extremities: Full ROM and Muscle Strength 5/5  Lumbar Paraspinal Tenderness: L-4-L-5 Lower Extremities: Full ROM and Muscle Strength 5/5 Arises from chair with ease Narrow Based  Gait     Skin:    General: Skin is warm and dry.  Neurological:     Mental Status: He is alert and oriented to person, place, and time.  Psychiatric:        Mood and Affect: Mood normal.        Behavior: Behavior normal.         Assessment & Plan:  1. Right  Shoulder Pain: Continue HEP as Tolerated. Continue current medication regimen. Continue to monitor. 08/02/2022 2.Left Greater Trochanter Bursitis: No complaints today. S/P Left Hip Injection with Dr Ranell Patrick on 09/03/2020, no relief noted he reports, Continue to alternate with Heat an Ice Therapy. Continue to monitor. 08/02/2022 3. Bilateral  Knees OA: Continue HEP as Tolerated. Continue current medication regimen. 08/02/2022 3. Chest Wall Discomfort/ Neuropathic Pain: No complaints today. Continue Gabapentin. Continue to Monitor. 08/02/2022 4.Chronic Low Back Pain without sciatica: Continue HEP and Continue current medication regimen. Continue to monitor. 08/02/2022 5. Chronic Pain Syndrome: : Refilled: Oxycodone 15 mg one tablet every 6 hours  as needed for pain #120. We will continue the opioid monitoring program, this consists of regular clinic visits, examinations, urine drug screen, pill counts as well as use of [place] Controlled Substance Reporting system. A 12 month History has been reviewed on the [place] Controlled Substance Reporting System on 08/02/2022.   F/U in 1 month

## 2022-07-30 LAB — TOXASSURE SELECT,+ANTIDEPR,UR

## 2022-08-02 ENCOUNTER — Encounter: Payer: Self-pay | Admitting: Registered Nurse

## 2022-08-03 ENCOUNTER — Other Ambulatory Visit (HOSPITAL_COMMUNITY): Payer: Self-pay | Admitting: Cardiology

## 2022-08-03 ENCOUNTER — Telehealth: Payer: Self-pay | Admitting: *Deleted

## 2022-08-03 ENCOUNTER — Other Ambulatory Visit: Payer: Self-pay | Admitting: Cardiovascular Disease

## 2022-08-03 ENCOUNTER — Other Ambulatory Visit: Payer: Self-pay | Admitting: Registered Nurse

## 2022-08-03 NOTE — Telephone Encounter (Signed)
Patient needs PA for oxycodone. He has new insurance. He is out of his medication.

## 2022-08-03 NOTE — Telephone Encounter (Signed)
Prior auth for oxycodone submitted to [facility] via [facility].

## 2022-08-04 NOTE — Telephone Encounter (Signed)
Approved through 02/03/2023. Pharmacy notified.

## 2022-08-12 ENCOUNTER — Telehealth: Payer: Self-pay | Admitting: *Deleted

## 2022-08-12 NOTE — Telephone Encounter (Signed)
Urine drug screen for this encounter is consistent for prescribed medication 

## 2022-08-24 ENCOUNTER — Telehealth (HOSPITAL_COMMUNITY): Payer: Self-pay | Admitting: Pharmacy Technician

## 2022-08-24 ENCOUNTER — Other Ambulatory Visit (HOSPITAL_COMMUNITY): Payer: Self-pay

## 2022-08-24 NOTE — Telephone Encounter (Signed)
Advanced Heart Failure Patient Advocate Encounter  Medication Samples have been left at registration desk for patient pick up. Drug Name: Delene Loll 49/51MG  Qty: 1x 28 ct package LOT: [id number] Exp.: 10/2023 SIG: Take 1 tablet by mouth twice daily   The patient has been instructed regarding the correct time, dose, and frequency of taking this medication, including desired effects and most common side effects.   Clista Bernhardt, CPhT Rx Patient Advocate Phone: [phone]

## 2022-08-24 NOTE — Telephone Encounter (Signed)
Advanced Heart Failure Patient Advocate Encounter  Patient left vm asking for help with Entresto with new insurance. Called and spoke with the patient. Insurance found eligibility is different than what patient states he is using at this time. Would need patient to provide information or bring a copy of the card. He states he will bring it to the clinic.  Will assess after information is provided.

## 2022-08-25 ENCOUNTER — Other Ambulatory Visit (HOSPITAL_COMMUNITY): Payer: Self-pay

## 2022-08-25 NOTE — Telephone Encounter (Signed)
Advanced Heart Failure Patient Advocate Encounter  Patient brought in managed medicaid insurance card. Insurance is stating that patient has other primary coverage. Called and spoke with the patient. He is aware that he will need to call medicaid and see what primary insurance they have listed and need to provide disenrollment letter from that insurance before medicaid will act as primary again.   Advised him to call back when he has an update.  Charlann Boxer, CPhT

## 2022-08-28 ENCOUNTER — Other Ambulatory Visit: Payer: Self-pay | Admitting: Cardiovascular Disease

## 2022-08-31 ENCOUNTER — Other Ambulatory Visit (HOSPITAL_COMMUNITY): Payer: Self-pay

## 2022-08-31 ENCOUNTER — Encounter: Payer: Self-pay | Admitting: Physical Medicine and Rehabilitation

## 2022-08-31 ENCOUNTER — Encounter
Payer: Medicaid Other | Attending: Physical Medicine and Rehabilitation | Admitting: Physical Medicine and Rehabilitation

## 2022-08-31 VITALS — BP 120/79 | HR 72 | Ht 68.0 in | Wt 175.0 lb

## 2022-08-31 DIAGNOSIS — M545 Low back pain, unspecified: Secondary | ICD-10-CM | POA: Insufficient documentation

## 2022-08-31 DIAGNOSIS — G8929 Other chronic pain: Secondary | ICD-10-CM | POA: Diagnosis present

## 2022-08-31 DIAGNOSIS — G4700 Insomnia, unspecified: Secondary | ICD-10-CM | POA: Insufficient documentation

## 2022-08-31 DIAGNOSIS — G894 Chronic pain syndrome: Secondary | ICD-10-CM | POA: Insufficient documentation

## 2022-08-31 MED ORDER — OXYCODONE HCL 15 MG PO TABS: 15.0000 mg | ORAL_TABLET | Freq: Four times a day (QID) | ORAL | 0 refills | Status: DC | PRN

## 2022-08-31 MED ORDER — VITAMIN D (ERGOCALCIFEROL) 1.25 MG (50000 UNIT) PO CAPS: 50000.0000 [IU] | ORAL_CAPSULE | ORAL | 1 refills | Status: DC

## 2022-08-31 NOTE — Patient Instructions (Signed)
-  red light or infrared light  -Try to go outside near sunrise -Get exercise during the day.  -Turn off all devices an hour before bedtime.  -Teas that can benefit: chamomile, valerian root, Brahmi (Bacopa) -Can consider over the counter melatonin, magnesium, and/or L-theanine. Melatonin is an anti-oxidant with multiple health benefits. Magnesium is involved in greater than 300 enzymatic reactions in the body and most of [place] are deficient as our soil is often depleted. There are 7 different types of magnesium- Bioptemizer's is a supplement with all 7 types, and each has unique benefits. Magnesium can also help with constipation and anxiety.  -Pistachios naturally increase the production of melatonin -Cozy Earth bamboo bed sheets are free from toxic chemicals.  -Tart cherry juice or a tart cherry supplement can improve sleep and soreness post-workout

## 2022-08-31 NOTE — Progress Notes (Signed)
Subjective:    Patient ID: William Crawford, male    DOB: Nov 17, 1970, 52 y.o.   MRN: [id number]   William Crawford is a 52 y.o. male who presents for f/u of chronic low back pain, bilateral knee pain, chest pain, trigger finger, testosterone deficiency, and neck pain.   1) Chest pain -no more new episodes -this dose of medicine is helping -no chest pain recently -this felt hot, burning, stabbing when it happened -the only way he could get by was when he tried to stand still -it felt like eternity.  -laid off regular job recently as his employer was running out of money -the nitroglycerin he has been carrying around but has not needed it, symptoms have improved -he was able to get another full time job -after several months of no chest pain, patient reports a recurrence of chest pain this week -previously his chest pain had resolved after Qutenza administration -he asks why it may have started again. -has not been present since he last called me -currently pain is worse in both knees and his low back. He cannot say whether the testosterone supplement helps.  -he asks whether he should work with a Land. -has affected his ability to work, but he pushes through, but he has been wondering whether to pursue a different job. He has been working at his current job for a long time and it is hard on his back and knees. He does feel that asking for accommodations- particularly less times on his knees would help. He does use knee pads to support his knees.  -he wanted to pursue the Qutenza patch but it is not covered by his new insurance plan. -back pain has been worsening -he would benefit from a letter with work restrictions -he would like to pursue MRI, has not yet heard about this.   2) Knee, back, and shoulder pain -pain has been stable -he is now working for himself, he is doing the same job -bending over still hurts -saw the chiropractor when the back flares up -his  chiropractor used a magnetic machine that was made by [employer], it helped tremendously, the inflammation in his back improved.  -feels like knees are getting worse -standing up after sitting for a prolonged time seems to help -PT helped a little bit -he takes Gabapentin. He takes 800mg  TID. He thinks this helps with his opioid.  -has been using  electrical stimulator and that really helps -he asks what else he can take during the day  -His pain medication does not last as long as he would like,  -some days his pain is worse.  -He gets electric type pain down the legs almost every day more down left side than right.  -he asks if he can use hemp seed oil -he discussed with his cardiologist decreasing his atorvastatin dose and it has been decreased to 20mg . 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 He feels that this may have contributed to his worsening pain -been ok -he would like to know his vitamin D and testosterone level results and discuss treatment for his trigger finger -qutenza has helped minimize his chest pain, it is now gone. -had a death in the family- uncle had a  pneumonia. He lives in [place]. He died before he could leave work. Her aunt is taking this pretty hard. He was really close to him. They used to talk 2-3 times per week.  -The knee pain is located just under the knee cap and extends medially to laterally -He is feeling much better this week and has been able to work, but still having significant knee and low back pain -Also has chest pain at times- sharp and lasts three minutes when he has it.  -Pain has grown excruciating but he has been able to return to work as he needs to financially. -Discussed results of his bilateral knee MRI, his neck XR, and  his back XR -He continues to have chest pain and had recent follow-up with cardiology. Would still like to try the Qutenza patch -the Cymbalta did not help.  -he requests if oxycodone can be increased to 6 tablets per day.  -does not note radiation of pain into legs, but legs have given out dur to pain causing near fall -he does not note loss of bowel or bladder function -does note constipation -also having shooting pain down both arms into pinky fingers -discussed that bilateral knee pain and back pain could potentially be related to opioid-induced testosterone deficiency and he is agreeable to checking testosterone levels -he is also interested in ortho follow-up for his knee pain -he is feeling better today and was able to go to work -had burning pain over the weekend from the Qutenza that is now gone. Forgot to use the cooling gel. -he has never had PSA checked, discussed that result is 0.4 which is low, so we could trial testosterone patch given his deficiency and symptoms if he would like. He is agreeable and I have sent to his pharmacy.   Prior history: He states his pain is located in his left shoulder, lower back pain, left hip and bilateral knee pain. He was in contact with Dr Carlis Abbott regarding his increase intensity of pain, per Dr Carlis Abbott note his oxycodone will be increased to 5 times a day as needed for pain. William Crawford understanding.  He rates his pain 4. His current exercise regime is walking and performing stretching exercises.  William Crawford Morphine equivalent is 60.00  MME.   Last UDs was Performed on 08/28/2020, it was consistent.   He is having a good summer.   He fell down and hit his right ankle and got a big bruise- not hurting too bad.   He had a good time with his grandson riding rolling coasters at [employer]  His chest pain returned and it feels sore and he plans to f/u with his surgeon regarding the pain. It feels sharp, hot, like a burning He takes  Gabapentin - but that doesn't help unless used with the oxycodone.  2) Currently his pain is worse in bilateral knees- it is worst when he kneels, climbs ladders. He has a supportive community at work which allow him to limit his kneeling and he has been using pads when he kneels. His oxycodone is now only lasting him about 2.5 hours after he takes it and he has been taking them 5 times per day. He asks if there is anything stronger he can take. He has tried Voltaren gel in the past and its effect usually started an hour after application.  -got compression sleeves to apply to his knees and has been able to work -at times his knee still buckles and this is  painful -he feels he is getting used to the pain with walking but as far as climbing he feels it a lot more -he got a steroid injection by orthopedics and did find that it helped but did not last very long.  -he has been trying to eat more foods that reduce inflammation -this often keeps him up at night -he asks whether he can take cayenne peppers -wind worsens the joint pain  Low testosterone:  -he bought an over the counter supplement since the patch was not covered by insurance -takes magnesium, B1, zinc, vitamin D.      Pain Inventory Average Pain 7 Pain Right Now 7 My pain is constant, sharp, burning, stabbing, and aching  In the last 24 hours, has pain interfered with the following? General activity 7 Relation with others 5 Enjoyment of life 5 What TIME of day is your pain at its worst? morning , daytime, and night Sleep (in general) Poor  Pain is worse with: walking, bending, sitting, inactivity, and some activites Pain improves with: rest, medication, and TENS Relief from Meds: 5  Family History  Problem Relation Age of Onset   Heart attack Father    Heart disease Father    Heart attack Paternal Grandfather    Heart disease Paternal Grandfather    Heart attack Paternal Uncle    Heart disease Paternal Uncle     Heart attack Paternal Uncle    Heart disease Paternal Uncle    Social History   Socioeconomic History   Marital status: Single    Spouse name: Not on file   Number of children: Not on file   Years of education: Not on file   Highest education level: Not on file  Occupational History   Not on file  Tobacco Use   Smoking status: Former    Types: Cigarettes    Quit date: 10/23/2019    Years since quitting: 2.8   Smokeless tobacco: Never  Vaping Use   Vaping Use: Never used  Substance and Sexual Activity   Alcohol use: Not Currently    Comment: QUIT  IN MAY [id number]   Drug use: No   Sexual activity: Not on file  Other Topics Concern   Not on file  Social History Narrative   Not on file   Social Determinants of Health   Financial Resource Strain: Not on file  Food Insecurity: Not on file  Transportation Needs: Not on file  Physical Activity: Not on file  Stress: Not on file  Social Connections: Not on file   Past Surgical History:  Procedure Laterality Date   APPLICATION OF WOUND VAC  10/25/2019   Procedure: Application Of Wound Vac;  Surgeon: Kerin Perna, MD;  Location: [facility] OR;  Service: Open Heart Surgery;;   CANNULATION FOR ECMO (EXTRACORPOREAL MEMBRANE OXYGENATION)  10/25/2019   Procedure: Cannulation For Ecmo (Extracorporeal Membrane Oxygenation) @ 1531;  Surgeon: Donata Clay, Theron Arista, MD;  Location: [facility] OR;  Service: Open Heart Surgery;;   CANNULATION FOR ECMO (EXTRACORPOREAL MEMBRANE OXYGENATION) N/A 10/30/2019   Procedure: DECANNULATION OF ECMO (EXTRACORPOREAL MEMBRANE OXYGENATION);  Surgeon: Donata Clay, Theron Arista, MD;  Location: [facility] OR;  Service: Open Heart Surgery;  Laterality: N/A;  Pump standby   CHEST EXPLORATION  10/25/2019   Procedure: Chest Exploration for repair of coronary perforation (repair of LAD);  Surgeon: Donata Clay, Theron Arista, MD;  Location: [facility] OR;  Service: Open Heart Surgery;;   CORONARY CTO INTERVENTION N/A 10/25/2019   Procedure: CORONARY CTO INTERVENTION;  Surgeon:  Swaziland, Peter M, MD;  Location: [facility] INVASIVE CV LAB;  Service: Cardiovascular;  Laterality: N/A;   CORONARY STENT INTERVENTION N/A 10/17/2019   Procedure: CORONARY STENT INTERVENTION;  Surgeon: Tonny Bollman, MD;  Location: [facility] INVASIVE CV LAB;  Service: Cardiovascular;  Laterality: N/A;   LEFT HEART CATH AND CORONARY ANGIOGRAPHY N/A 10/17/2019   Procedure: LEFT HEART CATH AND CORONARY ANGIOGRAPHY;  Surgeon: Tonny Bollman, MD;  Location: [facility] INVASIVE CV LAB;  Service: Cardiovascular;  Laterality: N/A;   MEDIASTERNOTOMY  10/25/2019   Procedure: Median Sternotomy.;  Surgeon: Kerin Perna, MD;  Location: [facility] OR;  Service: Open Heart Surgery;;   PLACEMENT OF IMPELLA LEFT VENTRICULAR ASSIST DEVICE  10/30/2019   Procedure: Placement Of Impella Left Ventricular Assist Device 5.5 USING HEMASHIELD PLATINUM GRAFT;  Surgeon: Kerin Perna, MD;  Location: [facility] OR;  Service: Open Heart Surgery;;  graft placed into aorta   REMOVAL OF IMPELLA LEFT VENTRICULAR ASSIST DEVICE N/A 11/03/2019   Procedure: REMOVAL OF IMPELLA 5.5 LEFT VENTRICULAR ASSIST DEVICE;  Surgeon: Kerin Perna, MD;  Location: [facility] OR;  Service: Open Heart Surgery;  Laterality: N/A;   STERNAL CLOSURE N/A 10/30/2019   Procedure: Sternal Closure;  Surgeon: Kerin Perna, MD;  Location: [facility] OR;  Service: Open Heart Surgery;  Laterality: N/A;   STERNAL WIRES REMOVAL N/A 05/14/2020   Procedure: STERNAL WIRES REMOVAL;  Surgeon: Kerin Perna, MD;  Location: [facility] OR;  Service: Thoracic;  Laterality: N/A;   TEE WITHOUT CARDIOVERSION N/A 10/25/2019   Procedure: TRANSESOPHAGEAL ECHOCARDIOGRAM (TEE);  Surgeon: Donata Clay, Theron Arista, MD;  Location: [facility] OR;  Service: Open Heart Surgery;  Laterality: N/A;   TEE WITHOUT CARDIOVERSION N/A 10/30/2019   Procedure: TRANSESOPHAGEAL ECHOCARDIOGRAM (TEE);  Surgeon: Donata Clay, Theron Arista, MD;  Location: [facility] OR;  Service: Open Heart Surgery;  Laterality: N/A;   TEE WITHOUT CARDIOVERSION N/A 11/03/2019   Procedure: TRANSESOPHAGEAL  ECHOCARDIOGRAM (TEE);  Surgeon: Donata Clay, Theron Arista, MD;  Location: [facility] OR;  Service: Open Heart Surgery;  Laterality: N/A;   Past Surgical History:  Procedure Laterality Date   APPLICATION OF WOUND VAC  10/25/2019   Procedure: Application Of Wound Vac;  Surgeon: Kerin Perna, MD;  Location: [facility] OR;  Service: Open Heart Surgery;;   CANNULATION FOR ECMO (EXTRACORPOREAL MEMBRANE OXYGENATION)  10/25/2019   Procedure: Cannulation For Ecmo (Extracorporeal Membrane Oxygenation) @ 1531;  Surgeon: Kerin Perna, MD;  Location: [facility] OR;  Service: Open Heart Surgery;;   CANNULATION FOR ECMO (EXTRACORPOREAL MEMBRANE OXYGENATION) N/A 10/30/2019   Procedure: DECANNULATION OF ECMO (EXTRACORPOREAL MEMBRANE OXYGENATION);  Surgeon: Donata Clay, Theron Arista, MD;  Location: [facility] OR;  Service: Open Heart Surgery;  Laterality: N/A;  Pump standby   CHEST EXPLORATION  10/25/2019   Procedure: Chest Exploration for repair of coronary perforation (repair of LAD);  Surgeon: Donata Clay, Theron Arista, MD;  Location: [facility] OR;  Service: Open Heart Surgery;;   CORONARY CTO INTERVENTION N/A 10/25/2019   Procedure: CORONARY CTO INTERVENTION;  Surgeon: Swaziland, Peter M, MD;  Location: [facility] INVASIVE CV LAB;  Service: Cardiovascular;  Laterality: N/A;   CORONARY STENT INTERVENTION N/A 10/17/2019   Procedure: CORONARY STENT INTERVENTION;  Surgeon: Tonny Bollman, MD;  Location: [facility] INVASIVE CV LAB;  Service: Cardiovascular;  Laterality: N/A;   LEFT HEART CATH AND CORONARY ANGIOGRAPHY N/A 10/17/2019   Procedure: LEFT HEART CATH AND CORONARY ANGIOGRAPHY;  Surgeon: Tonny Bollman, MD;  Location: [facility] INVASIVE CV LAB;  Service: Cardiovascular;  Laterality: N/A;   MEDIASTERNOTOMY  10/25/2019  Procedure: Median Sternotomy.;  Surgeon: Kerin Perna, MD;  Location: [facility] OR;  Service: Open Heart Surgery;;   PLACEMENT OF IMPELLA LEFT VENTRICULAR ASSIST DEVICE  10/30/2019   Procedure: Placement Of Impella Left Ventricular Assist Device 5.5 USING HEMASHIELD PLATINUM GRAFT;   Surgeon: Kerin Perna, MD;  Location: [facility] OR;  Service: Open Heart Surgery;;  graft placed into aorta   REMOVAL OF IMPELLA LEFT VENTRICULAR ASSIST DEVICE N/A 11/03/2019   Procedure: REMOVAL OF IMPELLA 5.5 LEFT VENTRICULAR ASSIST DEVICE;  Surgeon: Kerin Perna, MD;  Location: [facility] OR;  Service: Open Heart Surgery;  Laterality: N/A;   STERNAL CLOSURE N/A 10/30/2019   Procedure: Sternal Closure;  Surgeon: Kerin Perna, MD;  Location: [facility] OR;  Service: Open Heart Surgery;  Laterality: N/A;   STERNAL WIRES REMOVAL N/A 05/14/2020   Procedure: STERNAL WIRES REMOVAL;  Surgeon: Kerin Perna, MD;  Location: [facility] OR;  Service: Thoracic;  Laterality: N/A;   TEE WITHOUT CARDIOVERSION N/A 10/25/2019   Procedure: TRANSESOPHAGEAL ECHOCARDIOGRAM (TEE);  Surgeon: Donata Clay, Theron Arista, MD;  Location: [facility] OR;  Service: Open Heart Surgery;  Laterality: N/A;   TEE WITHOUT CARDIOVERSION N/A 10/30/2019   Procedure: TRANSESOPHAGEAL ECHOCARDIOGRAM (TEE);  Surgeon: Donata Clay, Theron Arista, MD;  Location: [facility] OR;  Service: Open Heart Surgery;  Laterality: N/A;   TEE WITHOUT CARDIOVERSION N/A 11/03/2019   Procedure: TRANSESOPHAGEAL ECHOCARDIOGRAM (TEE);  Surgeon: Donata Clay, Theron Arista, MD;  Location: [facility] OR;  Service: Open Heart Surgery;  Laterality: N/A;   Past Medical History:  Diagnosis Date   Alcohol abuse    Anxiety    CAD (coronary artery disease)    Cardiac arrest    Cardiogenic shock    CHF (congestive heart failure)    Cough    Dysrhythmia    GERD (gastroesophageal reflux disease)    HFrEF (heart failure with reduced ejection fraction)    Hypertension    Hypertriglyceridemia    Ischemic cardiomyopathy    Right atrial thrombus    Tobacco abuse    Ht 5\' 8"  (1.727 m)   Wt 175 lb (79.4 kg)   BMI 26.61 kg/m   Opioid Risk Score:   Fall Risk Score:  `1  Depression screen PHQ 2/9     07/27/2022    2:42 PM 06/05/2022    2:15 PM 05/05/2022    2:30 PM 04/06/2022    1:10 PM 12/19/2021    2:07 PM 11/18/2021    9:13 AM  10/21/2021    3:23 PM  Depression screen PHQ 2/9  Decreased Interest 0 0 0 0 0 0 0  Down, Depressed, Hopeless 0 0 0 0 0 0 0  PHQ - 2 Score 0 0 0 0 0 0 0     Review of Systems  Constitutional: Negative.   HENT: Negative.    Eyes: Negative.   Respiratory: Negative.    Cardiovascular: Negative.   Gastrointestinal: Negative.   Endocrine: Negative.   Genitourinary: Negative.   Musculoskeletal:  Positive for arthralgias, back pain and myalgias.  Skin: Negative.   Allergic/Immunologic: Negative.   Neurological:        Tingling  Hematological:  Bruises/bleeds easily.       Plavix  Psychiatric/Behavioral: Negative.    All other systems reviewed and are negative.      Objective:    Gen: no distress, normal appearing HEENT: oral mucosa pink and moist, NCAT Cardio: Reg rate Chest: normal effort, normal rate of breathing Abd: soft, non-distended Ext: no edema Psych:  pleasant, normal affect Skin: intact Neuro: Alert and oriented x3 MSK: bony sternal protrusion palpated      Assessment & Plan:  1. Left Shoulder Pain: Conitnue HEP as Tolerated. Continue current medication regimen. Continue to monitor. 11/22/2020  2.Left Greater Trochanter Bursitis: S/P Left Hip Injection with Dr Carlis Abbott on 09/03/2020, no relief noted he reports, Conitnue to alternate with Heat an Ice Therapy. Continue to monitor. 11/22/2020  3. Bilateral  Knees chondral defects: Conitnue HEP as Tolerated. Continue current medication regimen. 11/22/2020. Discussed results of bilateral knee MRIs which showed chondral deficits. Advised application of voltaren gel. Advised icing in evenings. Did not benefit from prior steroid taper. Referred to ortho for eval for arthroscopic debridement.  Discussed voltaren gel -discussed that pain could be secondary to opioid induced testoserone deficiency -testosterone level checked and it is is low. -Discussed the risks and benefits of testosterone replacement therapy and he is  interested in trying this  -PSA should be monitored while using testosterone replacement therapy as there is an increased risk of prostate cancer.  -recommended Wobenxyme -refilled oxycodone and gabapentin.  -Discussed following foods that may reduce pain: 1) Ginger (especially studied for arthritis)- reduce leukotriene production to decrease inflammation 2) Blueberries- high in phytonutrients that decrease inflammation 3) Salmon- marine omega-3s reduce joint swelling and pain 4) Pumpkin seeds- reduce inflammation 5) dark chocolate- reduces inflammation 6) turmeric- reduces inflammation 7) tart cherries - reduce pain and stiffness 8) extra virgin olive oil - its compound olecanthal helps to block prostaglandins  9) chili peppers- can be eaten or applied topically via capsaicin 10) mint- helpful for headache, muscle aches, joint pain, and itching 11) garlic- reduces inflammation  Link to further information on diet for chronic pain: http://www.bray.com/   3. Chest Wall Discomfort/ Neuropathic Pain: Conitnue Gabapentin. Continue to Monitor. 11/22/2020. Prescribed lidocaine patch.  -Cymbalta was stopped -Stop Topamax.  -encouraged follow-up with cardiologist -continue nitroglycerin PRN -continue Percocet -Discussed Qutenza as an option for neuropathic pain control. Discussed that this is a capsaicin patch, stronger than capsaicin cream. Discussed that it is currently approved for diabetic peripheral neuropathy and post-herpetic neuralgia, but that it has also shown benefit in treating other forms of neuropathy. Provided patient with link to site to learn more about the patch: https://www.clark.biz/. Discussed that the patch would be placed in office and benefits usually last 3 months. Discussed that unintended exposure to capsaicin can cause severe irritation of eyes, mucous membranes, respiratory tract, and skin, but that  Qutenza is a local treatment and does not have the systemic side effects of other nerve medications. Discussed that there may be pain, itching, erythema, and decreased sensory function associated with the application of Qutenza. Side effects usually subside within 1 week. A cold pack of analgesic medications can help with these side effects. Blood pressure can also be increased due to pain associated with administration of the patch.   -4. Chronic Pain Syndrome: :  continue Oxycodone 15/325 mg 6 times a day as needed for pain #180. We will continue the opioid monitoring program, this consists of regular clinic visits, examinations, urine drug screen, pill counts as well as use of [place] Controlled Substance Reporting system. A 12 month History has been reviewed on the [place] Controlled Substance Reporting System on 11/22/2020. Discussed risks and benefits of opioid medications.  -Provided with a pain relief journal and discussed that it contains foods and lifestyle tips to naturally help to improve pain. Discussed that these lifestyle strategies are also very good  for health unlike some medications which can have negative side effects. Discussed that the act of keeping a journal can be therapeutic and helpful to realize patterns what helps to trigger and alleviate pain.   -Discussed following foods that may reduce pain: 1) Ginger (especially studied for arthritis)- reduce leukotriene production to decrease inflammation 2) Blueberries- high in phytonutrients that decrease inflammation 3) Salmon- marine omega-3s reduce joint swelling and pain 4) Pumpkin seeds- reduce inflammation 5) dark chocolate- reduces inflammation 6) turmeric- reduces inflammation 7) tart cherries - reduce pain and stiffness 8) extra virgin olive oil - its compound olecanthal helps to block prostaglandins  9) chili peppers- can be eaten or applied topically via capsaicin 10) mint- helpful for headache, muscle aches,  joint pain, and itching 11) garlic- reduces inflammation  Link to further information on diet for chronic pain: http://[email or web address]://www.[email or web address]/treatments/complementary/diet-patients-chronic-pain   5) Insomnia: increase amitriptyline to 25mg  HS, can try this instead of trazodone.   6) Low back pain: -continue percocet -continue seeing chiropractor -Discussed current symptoms of pain and history of pain.  -Discussed benefits of exercise in reducing pain. -Discussed following foods that may reduce pain: 1) Ginger (especially studied for arthritis)- reduce leukotriene production to decrease inflammation 2) Blueberries- high in phytonutrients that decrease inflammation 3) Salmon- marine omega-3s reduce joint swelling and pain 4) Pumpkin seeds- reduce inflammation 5) dark chocolate- reduces inflammation 6) turmeric- reduces inflammation 7) tart cherries - reduce pain and stiffness 8) extra virgin olive oil - its compound olecanthal helps to block prostaglandins  9) chili peppers- can be eaten or applied topically via capsaicin 10) mint- helpful for headache, muscle aches, joint pain, and itching 11) garlic- reduces inflammation  Link to further information on diet for chronic pain: http://[email or web address]://www.[email or web address]/treatments/complementary/diet-patients-chronic-pain  -Discussed results of MRI which shows disc degeneration, spinal stenosis, and bone marrow edema -discussed risks and benefits of extracorporeal shockwave therapy and will plan for this next visit.  -Given worsening pain and radiation of pain into legs intermittently with occasional weakness, new MRI ordered, will follow-up on this with April  -discussed that hemp products not recommended while receiving opioids as may show up as THC in his urine sample -discussed that muscle and ligament pathology can also cause severe pain at times -encouraged continued exercise and working to send blood flow to injured area and  promote healing -Provided with a pain relief journal and discussed that it contains foods and lifestyle tips to naturally help to improve pain. Discussed that these lifestyle strategies are also very good for health unlike some medications which can have negative side effects. Discussed that the act of keeping a journal can be therapeutic and helpful to realize patterns what helps to trigger and alleviate pain.   -Wobenzyme -Discussed response to decrease in atorvastatin. Discussed that cholesterol is important for muscle function and lowering cholesterol levels too much is associated with joint pain in some patients. Discussed that some patients feel relief as soon as decreasing statin medications, whereas for others it takes a longer time to see if pain improves or not. Recommended repeating his lipid panel prior to when he is due for his next refill so that we make sure his LDL does not increase above 100.   7) Cervical radiuculitis: -Discussed that cervical XR showed disc degeneration  8) Testosterone deficiency Discussed that testosterone level has improved with his exercise and dietary changes -prescribed vitamin D  9) Trigger finger, left -recommended brace -if this does not help, recommend orthopedic follow-up for trigger point injection  10) Prostate cancer screening: -PSA check today.   11) HLD: -reviewed last lipid panel with him -discussed checking lipid panel with cardiologist.  -recommend decreasing atorvastatin to 20mg , but should discuss with cardiologist.   12. Suboptimal vitamin D -prescribed 50,000U ergocalciferol once per week for 7 weeks -now can continue over the counter D3  13) Trigger finger -sent him a picture of brace he can purchase on [place] -referred to surgery for steroid injection if brace fails to help  14) Insomnia -increase amitriptyline 50mg  HS  -discussed red light therapy -Try to go outside near sunrise -Get exercise during the day.  -Turn off  all devices an hour before bedtime.  -Teas that can benefit: chamomile, valerian root, Brahmi (Bacopa) -Can consider over the counter melatonin, magnesium, and/or L-theanine. Melatonin is an anti-oxidant with multiple health benefits. Magnesium is involved in greater than 300 enzymatic reactions in the body and most of [place] are deficient as our soil is often depleted. There are 7 different types of magnesium- Bioptemizer's is a supplement with all 7 types, and each has unique benefits. Magnesium can also help with constipation and anxiety.  -Pistachios naturally increase the production of melatonin -Cozy Earth bamboo bed sheets are free from toxic chemicals.  -Tart cherry juice or a tart cherry supplement can improve sleep and soreness post-workout

## 2022-09-01 ENCOUNTER — Encounter (HOSPITAL_COMMUNITY): Payer: Self-pay | Admitting: Cardiology

## 2022-09-01 ENCOUNTER — Other Ambulatory Visit (HOSPITAL_COMMUNITY): Payer: Self-pay

## 2022-09-01 ENCOUNTER — Telehealth (HOSPITAL_COMMUNITY): Payer: Self-pay | Admitting: Pharmacy Technician

## 2022-09-01 ENCOUNTER — Ambulatory Visit (HOSPITAL_COMMUNITY)
Admission: RE | Admit: 2022-09-01 | Discharge: 2022-09-01 | Disposition: A | Discharge status: Home / Self Care | Payer: Medicaid Other | Source: Ambulatory Visit | Attending: Cardiology | Admitting: Cardiology

## 2022-09-01 VITALS — BP 104/60 | HR 74 | Wt 180.0 lb

## 2022-09-01 DIAGNOSIS — I5022 Chronic systolic (congestive) heart failure: Secondary | ICD-10-CM | POA: Insufficient documentation

## 2022-09-01 DIAGNOSIS — I255 Ischemic cardiomyopathy: Secondary | ICD-10-CM | POA: Diagnosis not present

## 2022-09-01 DIAGNOSIS — M069 Rheumatoid arthritis, unspecified: Secondary | ICD-10-CM | POA: Diagnosis not present

## 2022-09-01 DIAGNOSIS — Z87891 Personal history of nicotine dependence: Secondary | ICD-10-CM | POA: Insufficient documentation

## 2022-09-01 DIAGNOSIS — Z79899 Other long term (current) drug therapy: Secondary | ICD-10-CM | POA: Insufficient documentation

## 2022-09-01 DIAGNOSIS — Z7902 Long term (current) use of antithrombotics/antiplatelets: Secondary | ICD-10-CM | POA: Insufficient documentation

## 2022-09-01 DIAGNOSIS — E781 Pure hyperglyceridemia: Secondary | ICD-10-CM | POA: Insufficient documentation

## 2022-09-01 DIAGNOSIS — I2510 Atherosclerotic heart disease of native coronary artery without angina pectoris: Secondary | ICD-10-CM | POA: Insufficient documentation

## 2022-09-01 DIAGNOSIS — I252 Old myocardial infarction: Secondary | ICD-10-CM | POA: Insufficient documentation

## 2022-09-01 DIAGNOSIS — Z8674 Personal history of sudden cardiac arrest: Secondary | ICD-10-CM | POA: Diagnosis not present

## 2022-09-01 DIAGNOSIS — Z955 Presence of coronary angioplasty implant and graft: Secondary | ICD-10-CM | POA: Diagnosis not present

## 2022-09-01 DIAGNOSIS — Z7901 Long term (current) use of anticoagulants: Secondary | ICD-10-CM | POA: Diagnosis not present

## 2022-09-01 DIAGNOSIS — I110 Hypertensive heart disease with heart failure: Secondary | ICD-10-CM | POA: Insufficient documentation

## 2022-09-01 LAB — BASIC METABOLIC PANEL
Anion gap: 6 (ref 5–15)
BUN: 8 mg/dL (ref 6–20)
CO2: 31 mmol/L (ref 22–32)
Calcium: 9.3 mg/dL (ref 8.9–10.3)
Chloride: 102 mmol/L (ref 98–111)
Creatinine, Ser: 1.28 mg/dL — ABNORMAL HIGH (ref 0.61–1.24)
GFR, Estimated: >60 mL/min (ref >60)
Glucose, Bld: 97 mg/dL (ref 70–99)
Potassium: 4.7 mmol/L (ref 3.5–5.1)
Sodium: 139 mmol/L (ref 135–145)

## 2022-09-01 LAB — LIPID PANEL
Cholesterol: 131 mg/dL (ref 0–200)
HDL: 43 mg/dL (ref >40)
LDL Cholesterol: 69 mg/dL (ref 0–99)
Total CHOL/HDL Ratio: 3 RATIO
Triglycerides: 95 mg/dL (ref <150)
VLDL: 19 mg/dL (ref 0–40)

## 2022-09-01 LAB — BRAIN NATRIURETIC PEPTIDE: B Natriuretic Peptide: 36.7 pg/mL (ref 0.0–100.0)

## 2022-09-01 MED ORDER — ATORVASTATIN CALCIUM 40 MG PO TABS: 40.0000 mg | ORAL_TABLET | Freq: Every day | ORAL | 3 refills | Status: DC

## 2022-09-01 NOTE — Telephone Encounter (Signed)
Advanced Heart Failure Patient Advocate Encounter  Prior Authorization for Sherryll Burger has been approved.    PA# [id number] Effective dates: 09/01/22 through 09/01/23  Insurance is still returning that the patient has other primary insurance. Will follow up at the end of the week to see if the rejection has been cleared.  Archer Asa, CPhT

## 2022-09-01 NOTE — Telephone Encounter (Signed)
Advanced Heart Failure Patient Advocate Encounter  Patient called back stating that the insurance issue has been resolved. It's still showing that the patient has alternate primary insurance and that a PA is required. Provided patient samples and submitted PA in case that was the issue.  Will follow up with patient once PA determination is back.  Archer Asa, CPhT

## 2022-09-01 NOTE — Progress Notes (Signed)
PCP: Barbie Banner, MD  HF Cardiology: Dr. Shirlee Latch  52 y.o. with history of heavy smoking, heavy ETOH, and strong FH of CAD was initially admitted to [facility] on 10/17/19 with chest pain and NSTEMI.  LHC showed 95% distal RCA (culprit) with chronically occluded proximal LAD and OM1 with collaterals.  He had DES to distal RCA initially.  Echo showed EF 35-40% with inferior wall motion abnormality and normal RV.  He was discharged home with plan for CTO intervention on LAD.  He came for elective CTO procedure in 6/21, however he had perforation of the LAD with balloon inflation. A covered stent was placed.  Echo showed EF 45% with apical hypokinesis, moderate pericardial effusion and evidence for tamponade.  He was then taken to the OR for LAD repair and drainage of pericardial effusion.    In the OR, he had a VF arrest with about 15 minutes of CPR.  He was put on cardiopulmonary bypass and the tear in the LAD was repaired.  The LAD was not graftable.  TEE in the OR showed severe biventricular failure, suspect stunning post-VF arrest.  He was unable to come off bypass.  Therefore, he was cannulated for ECMO with venous drainage from RA and arterial return to ascending aorta.  After 4 days, ECMO was decannulated and Impella 5.5 was placed.  4 days after that, Impella was removed. He was eventually weaned off pressors/inotropes and discharged home.  Echo in 11/03/19 showed EF up to 45-50%.    Echo was done in 9/21 with EF 55% with mild anteroseptal hypokinesis and normal RV.   Patient was admitted with presyncope in 10/21.  Workup was unremarkable, thought to be possible vasovagal event.  As HR was around 50 at times, Coreg was stopped. Sherryll Burger was also decreased. Echo in 10/21 showed EF 50-55% with normal RV. Zio patch showed no significant arrhythmias.   Echo in 10/21 showed EF 50% with mid inferolateral severe hypokinesis, normal RV, mild MR. Echo in 9/22 was stable with LV EF 50%, normal RV.   Patient  returns for followup of CAD and CHF.  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  It has gradually improved over time.   He is on gabapentin and followed in a pain clinic for chronic chest, low back, shoulder, and knee pain.  No exertional chest pain.  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  No significant exertional dyspnea unless he heavily exerts himself.  No orthopnea/PND.  No palpitations.   Labs (7/21): K 4.1, creatinine 1.08 Labs (8/21): K 3.9, creatinine 1.04, LDL 67, HDL 37, TGs 248 Labs (12/21): K 3.7, creatinine 1.2 Labs (3/22): LDL 57, HDl 36, TGs 80 Labs (7/23): LDL 71  ECG (personally reviewed): NSR, RBBB  PMH: 1. ETOH abuse 2. Smoking 3. GERD 4. CAD: NSTEMI in 5/21 with 95% distal RCA (culprit) and CTO LAD and OM1.  He had DES to distal RCA.  - He returned in 6/21 for CTO procedure to LAD, complicated by perforation of the LAD.  He was taken to the OR emergently for repair of the perforation.  Unable to graft the LAD.  5. VF arrest: 6/21 at time of LAD perforation/tamponade. 6. Ischemic cardiomyopathy:  - Echo (5/21): EF 35-40%. - Echo (6/21, post arrest): EF < 20%, moderately reduced RV function.  - ECMO in 6/21 post-LAD perforation and arrest.  - Echo (11/03/19): EF 45-50%.  - Echo (9/21): EF 55%, mild  anteroseptal hypokinesis, normal RV.  - Echo (10/21): EF 50-55% with normal RV. - Echo (9/22): LV EF 50%, normal RV.  7. Right atrial thrombus: Noted post-ECMO.  Warfarin.  8. Presyncopal episode in 10/21: Zio patch (10/21) with no significant arrhythmias noted.  9. Neuropathic chest wall pain post-sternotomy.   Social History   Socioeconomic History   Marital status: Single    Spouse name: Not on file   Number of children: Not on file   Years of education: Not on file   Highest education level: Not on file  Occupational History   Not on file  Tobacco Use   Smoking status: Former    Types: Cigarettes    Quit date: 10/23/2019     Years since quitting: 2.8   Smokeless tobacco: Never  Vaping Use   Vaping Use: Never used  Substance and Sexual Activity   Alcohol use: Not Currently    Comment: QUIT  IN MAY 63335   Drug use: No   Sexual activity: Not on file  Other Topics Concern   Not on file  Social History Narrative   Not on file   Social Determinants of Health   Financial Resource Strain: Not on file  Food Insecurity: Not on file  Transportation Needs: Not on file  Physical Activity: Not on file  Stress: Not on file  Social Connections: Not on file  Intimate Partner Violence: Not on file   Family History  Problem Relation Age of Onset   Heart attack Father    Heart disease Father    Heart attack Paternal Grandfather    Heart disease Paternal Grandfather    Heart attack Paternal Uncle    Heart disease Paternal Uncle    Heart attack Paternal Uncle    Heart disease Paternal Uncle    ROS: All systems reviewed and negative except as per HPI.   Current Outpatient Medications  Medication Sig Dispense Refill   amitriptyline (ELAVIL) 25 MG tablet Take 1 tablet (25 mg total) by mouth at bedtime. 90 tablet 3   Ascorbic Acid (VITAMIN C) 100 MG tablet Take 100 mg by mouth daily.     carvedilol (COREG) 6.25 MG tablet TAKE 1 TABLET TWICE DAILY 60 tablet 0   clopidogrel (PLAVIX) 75 MG tablet TAKE 1 TABLET(75 MG) BY MOUTH DAILY 90 tablet 3   ENTRESTO 49-51 MG TAKE 1 TABLET BY MOUTH TWICE A DAY 60 tablet 0   fenofibrate (TRICOR) 48 MG tablet TAKE 1 TABLET EVERY DAY 90 tablet 2   fluticasone (FLONASE) 50 MCG/ACT nasal spray Place 2 sprays into both nostrils daily.     gabapentin (NEURONTIN) 800 MG tablet Take 1 tablet (800 mg total) by mouth 3 (three) times daily. 90 tablet 3   Misc Natural Products (OSTEO BI-FLEX ADV JOINT SHIELD) TABS Take 2 tablets by mouth.     nitroGLYCERIN (NITROSTAT) 0.4 MG SL tablet Place 1 tablet (0.4 mg total) under the tongue every 5 (five) minutes as needed for chest pain. 30 tablet  12   oxyCODONE (ROXICODONE) 15 MG immediate release tablet Take 1 tablet (15 mg total) by mouth every 6 (six) hours as needed for pain. 120 tablet 0   pantoprazole (PROTONIX) 40 MG tablet Take 1 tablet by mouth daily.     thiamine 100 MG tablet Take 1 tablet by mouth daily.     Vitamin D, Ergocalciferol, (DRISDOL) 1.25 MG (50000 UNIT) CAPS capsule Take 1 capsule (50,000 Units total) by mouth every 7 (seven) days. 12  capsule 1   Vitamins C E (VITAMIN C & E COMPLEX PO) Take by mouth daily at 12 noon.     atorvastatin (LIPITOR) 40 MG tablet Take 1 tablet (40 mg total) by mouth daily. 90 tablet 3   No current facility-administered medications for this encounter.   BP 104/60   Pulse 74   Wt 81.6 kg (180 lb)   SpO2 100%   BMI 27.37 kg/m  General: NAD Neck: No JVD, no thyromegaly or thyroid nodule.  Lungs: Clear to auscultation bilaterally with normal respiratory effort. CV: Nondisplaced PMI.  Heart regular S1/S2, no S3/S4, no murmur.  No peripheral edema.  No carotid bruit.  Normal pedal pulses.  Abdomen: Soft, nontender, no hepatosplenomegaly, no distention.  Skin: Intact without lesions or rashes.  Neurologic: Alert and oriented x 3.  Psych: Normal affect. Extremities: No clubbing or cyanosis.  HEENT: Normal.   Assessment/Plan: 1. CAD: NSTEMI in 5/21 with DES to distal RCA.  He was left with occluded OM1 and LAD. CTO procedure was attempted to open LAD, but complicated by LAD perforation and tamponade/cardiac arrest requiring trip to OR and [facility] ECMO. The LAD was not graftable, he has no perfusion to the LAD territory except by collaterals.  No exertional chest pain.  - Continue clopidogrel 75 mg daily.  - Continue atorvastatin, dose was decreased due to joint pain but this did not help.  Check lipids today, if LDL not < 55, will increase atorvastatin.  2. Cardiac arrest: In OR after LAD perforation/tamponade.  - With recovery of EF, he is off amiodarone.  3. RA thrombus: Related to venous  cannulation for ECMO (pulled from RA).   - Off warfarin with recovery of EF and no thrombus on last echo.  4. Smoking: He remains abstinent.  5. ETOH abuse: rare use.  6. Chronic systolic CHF: Ischemic cardiomyopathy.  Echo after cardiac arrest in 6/21 showed EF < 20% with moderate RV dysfunction.  Echo in 6/21 after decannulation of ECMO and removal of Impella 5.5 showed EF up to 45-50%.  Echo in 10/21 with EF 50-55%.  Echo in 9/22 with EF 50%. On exam, he is not volume overloaded.  NYHA class I, working full time as a Education administratorpainter.   - Continue Coreg 6.25 mg bid.  - Continue Entresto 49/51 bid. BMET/BNP today.  - Off spironolactone, he had painful gynecomastia that is likely due to spironolactone.  - He is not on a loop diuretic and does not appear to need one.  - I will arrange for an echo.  7. Hypertriglyceridemia: Continue Vascepa.  8. HTN: BP controlled.   Followup 6 months with APP  Marca AnconaDalton Zuleima Haser 09/01/2022

## 2022-09-01 NOTE — Patient Instructions (Signed)
INCREASE Atrovastatin tp 40 mg daily.  Labs done today, your results will be available in MyChart, we will contact you for abnormal readings.  Repeat blood work in 2 months  Your physician has requested that you have an echocardiogram. Echocardiography is a painless test that uses sound waves to create images of your heart. It provides your doctor with information about the size and shape of your heart and how well your heart's chambers and valves are working. This procedure takes approximately one hour. There are no restrictions for this procedure. Please do NOT wear cologne, perfume, aftershave, or lotions (deodorant is allowed). Please arrive 15 minutes prior to your appointment time.  Your physician recommends that you schedule a follow-up appointment in: 6 months (October) ** please call the office in July to arrange your follow up appointment. **  If you have any questions or concerns before your next appointment please send [place] a message through [place] or call our office at [phone].    TO LEAVE A MESSAGE FOR THE NURSE SELECT OPTION 2, PLEASE LEAVE A MESSAGE INCLUDING: YOUR NAME DATE OF BIRTH CALL BACK NUMBER REASON FOR CALL**this is important as we prioritize the call backs  YOU WILL RECEIVE A CALL BACK THE SAME DAY AS LONG AS YOU CALL BEFORE 4:00 PM  At the [facility], you and your health needs are our priority. As part of our continuing mission to provide you with exceptional heart care, we have created designated Provider Care Teams. These Care Teams include your primary Cardiologist (physician) and Advanced Practice Providers (APPs- Physician Assistants and Nurse Practitioners) who all work together to provide you with the care you need, when you need it.   You may see any of the following providers on your designated Care Team at your next follow up: Dr Arvilla Meres Dr Marca Ancona Dr. Marcos Eke, NP Robbie Lis, [place] [facility] [place], [place] Brynda Peon, NP Karle Plumber, PharmD   Please be sure to bring in all your medications bottles to every appointment.    Thank you for choosing [facility]

## 2022-09-01 NOTE — Telephone Encounter (Signed)
Medication Samples have been provided to the patient.  Drug name: Sherryll Burger       Strength: 49-51mg         Qty: 2 bottles  LOT: [id number]  Exp.Date: 06/25  Dosing instructions: Take 1 tablet twice daily.   The patient has been instructed regarding the correct time, dose, and frequency of taking this medication, including desired effects and most common side effects.   William Crawford 4:55 PM 09/01/2022

## 2022-09-01 NOTE — Telephone Encounter (Signed)
Patient Advocate Encounter   Received notification from [facility] that prior authorization for William Crawford is required.   PA submitted on CoverMyMeds Key [id number] Status is pending   Will continue to follow.

## 2022-09-04 ENCOUNTER — Other Ambulatory Visit (HOSPITAL_COMMUNITY): Payer: Self-pay

## 2022-09-04 NOTE — Telephone Encounter (Signed)
Advanced Heart Failure Patient Advocate Encounter  [facility] [place] Medicaid is now paying as primary. Entresto co-pay $4. Called and spoke with the patient.   Archer Asa, CPhT

## 2022-09-20 ENCOUNTER — Other Ambulatory Visit: Payer: Self-pay | Admitting: Cardiovascular Disease

## 2022-09-25 ENCOUNTER — Other Ambulatory Visit: Payer: Self-pay | Admitting: Physical Medicine and Rehabilitation

## 2022-09-28 MED ORDER — OXYCODONE HCL 15 MG PO TABS: 15.0000 mg | ORAL_TABLET | Freq: Four times a day (QID) | ORAL | 0 refills | Status: DC | PRN

## 2022-10-09 ENCOUNTER — Encounter
Payer: Medicaid Other | Attending: Physical Medicine and Rehabilitation | Admitting: Physical Medicine and Rehabilitation

## 2022-10-09 VITALS — BP 126/82 | HR 94 | Ht 68.0 in | Wt 179.0 lb

## 2022-10-09 DIAGNOSIS — M549 Dorsalgia, unspecified: Secondary | ICD-10-CM | POA: Diagnosis present

## 2022-10-09 DIAGNOSIS — G4701 Insomnia due to medical condition: Secondary | ICD-10-CM | POA: Insufficient documentation

## 2022-10-09 DIAGNOSIS — R413 Other amnesia: Secondary | ICD-10-CM | POA: Insufficient documentation

## 2022-10-09 DIAGNOSIS — M170 Bilateral primary osteoarthritis of knee: Secondary | ICD-10-CM

## 2022-10-09 MED ORDER — CYCLOBENZAPRINE HCL 10 MG PO TABS: 10.0000 mg | ORAL_TABLET | Freq: Three times a day (TID) | ORAL | 3 refills | Status: DC | PRN

## 2022-10-09 NOTE — Patient Instructions (Signed)
[employer] coffee- Sprouts

## 2022-10-09 NOTE — Progress Notes (Signed)
Subjective:    Patient ID: [name], male    DOB: 1970/09/22, 52 y.o.   MRN: [id number]   CREW OCANA is a 52 y.o. male who presents for f/u of chronic low back pain, bilateral knee pain, chest pain, trigger finger, testosterone deficiency, and neck pain.   1) Chest pain -no more new episodes -this dose of medicine is helping -no chest pain recently -this felt hot, burning, stabbing when it happened -the only way he could get by was when he tried to stand still -it felt like eternity.  -laid off regular job recently as his employer was running out of money -the nitroglycerin he has been carrying around but has not needed it, symptoms have improved -he was able to get another full time job -after several months of no chest pain, patient reports a recurrence of chest pain this week -previously his chest pain had resolved after Qutenza administration -he asks why it may have started again. -has not been present since he last called me -currently pain is worse in both knees and his low back. He cannot say whether the testosterone supplement helps.  -he asks whether he should work with a Land. -has affected his ability to work, but he pushes through, but he has been wondering whether to pursue a different job. He has been working at his current job for a long time and it is hard on his back and knees. He does feel that asking for accommodations- particularly less times on his knees would help. He does use knee pads to support his knees.  -he wanted to pursue the Qutenza patch but it is not covered by his new insurance plan. -back pain has been worsening -he would benefit from a letter with work restrictions -he would like to pursue MRI, has not yet heard about this.   2) Knee, back, and shoulder pain -pain has been stable -he wants to make another appointment to see his chiropractor as his pain has not been good recently -doing ok with his pain medication -he asks  about muscle relaxers -he was given stretches for his lower back and hamstring -he is now working for himself, he is doing the same job -bending over still hurts -saw the chiropractor when the back flares up -his chiropractor used a magnetic machine that was made by [employer], it helped tremendously, the inflammation in his back improved.  -feels like knees are getting worse -standing up after sitting for a prolonged time seems to help -PT helped a little bit -he takes Gabapentin. He takes 800mg  TID. He thinks this helps with his opioid.  -has been using  electrical stimulator and that really helps -he asks what else he can take during the day  -His pain medication does not last as long as he would like,  -some days his pain is worse.  -He gets electric type pain down the legs almost every day more down left side than right.  -he asks if he can use hemp seed oil -he discussed with his cardiologist decreasing his atorvastatin dose and it has been decreased to 20mg . 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 He feels that this may have contributed to his worsening pain -been ok -  he would like to know his vitamin D and testosterone level results and discuss treatment for his trigger finger -qutenza has helped minimize his chest pain, it is now gone. -had a death in the family- uncle had a pneumonia. He lives in [place]. He died before he could leave work. Her aunt is taking this pretty hard. He was really close to him. They used to talk 2-3 times per week.  -The knee pain is located just under the knee cap and extends medially to laterally -He is feeling much better this week and has been able to work, but still having significant knee and low back pain -Also has chest pain  at times- sharp and lasts three minutes when he has it.  -Pain has grown excruciating but he has been able to return to work as he needs to financially. -Discussed results of his bilateral knee MRI, his neck XR, and his back XR -He continues to have chest pain and had recent follow-up with cardiology. Would still like to try the Qutenza patch -the Cymbalta did not help.  -he requests if oxycodone can be increased to 6 tablets per day.  -does not note radiation of pain into legs, but legs have given out dur to pain causing near fall -he does not note loss of bowel or bladder function -does note constipation -also having shooting pain down both arms into pinky fingers -discussed that bilateral knee pain and back pain could potentially be related to opioid-induced testosterone deficiency and he is agreeable to checking testosterone levels -he is also interested in ortho follow-up for his knee pain -he is feeling better today and was able to go to work -had burning pain over the weekend from the Qutenza that is now gone. Forgot to use the cooling gel. -he has never had PSA checked, discussed that result is 0.4 which is low, so we could trial testosterone patch given his deficiency and symptoms if he would like. He is agreeable and I have sent to his pharmacy.   Prior history: He states his pain is located in his left shoulder, lower back pain, left hip and bilateral knee pain. He was in contact with Dr Carlis Abbott regarding his increase intensity of pain, per Dr Carlis Abbott note his oxycodone will be increased to 5 times a day as needed for pain. Mr. bandy yacono understanding.  He rates his pain 4. His current exercise regime is walking and performing stretching exercises.  Mr. Gonsalez Morphine equivalent is 60.00  MME.   Last UDs was Performed on 08/28/2020, it was consistent.   He is having a good summer.   He fell down and hit his right ankle and got a big bruise- not hurting too bad.   He  had a good time with his grandson riding rolling coasters at [employer]  His chest pain returned and it feels sore and he plans to f/u with his surgeon regarding the pain. It feels sharp, hot, like a burning He takes Gabapentin 600mg - but that doesn't help unless used with the oxycodone.  2) Currently his pain is worse in bilateral knees- it is worst when he kneels, climbs ladders. He has a supportive community at work which allow him to limit his kneeling and he has been using pads when he kneels. His oxycodone is now only lasting him about 2.5 hours after he takes it and he has been taking them 5 times per day. He asks if there is anything stronger he can take. He has  tried Voltaren gel in the past and its effect usually started an hour after application.  -got compression sleeves to apply to his knees and has been able to work -at times his knee still buckles and this is painful -he feels he is getting used to the pain with walking but as far as climbing he feels it a lot more -he got a steroid injection by orthopedics and did find that it helped but did not last very long.  -he has been trying to eat more foods that reduce inflammation -this often keeps him up at night -he asks whether he can take cayenne peppers -wind worsens the joint pain  Low testosterone:  -he bought an over the counter supplement since the patch was not covered by insurance -takes magnesium, B1, zinc, vitamin D.      Pain Inventory Average Pain 7 Pain Right Now 7 My pain is constant, sharp, burning, stabbing, and aching  In the last 24 hours, has pain interfered with the following? General activity 6 Relation with others 7 Enjoyment of life 6 What TIME of day is your pain at its worst? Morning,evening and night Sleep (in general) fair  Pain is worse with: walking, bending, sitting, inactivity, and some activites Pain improves with: rest, medication, and TENS Relief from Meds: 6  Family History  Problem  Relation Age of Onset   Heart attack Father    Heart disease Father    Heart attack Paternal Grandfather    Heart disease Paternal Grandfather    Heart attack Paternal Uncle    Heart disease Paternal Uncle    Heart attack Paternal Uncle    Heart disease Paternal Uncle    Social History   Socioeconomic History   Marital status: Single    Spouse name: Not on file   Number of children: Not on file   Years of education: Not on file   Highest education level: Not on file  Occupational History   Not on file  Tobacco Use   Smoking status: Former    Types: Cigarettes    Quit date: 10/23/2019    Years since quitting: 2.9   Smokeless tobacco: Never  Vaping Use   Vaping Use: Never used  Substance and Sexual Activity   Alcohol use: Not Currently    Comment: QUIT  IN MAY 16109   Drug use: No   Sexual activity: Not on file  Other Topics Concern   Not on file  Social History Narrative   Not on file   Social Determinants of Health   Financial Resource Strain: Not on file  Food Insecurity: Not on file  Transportation Needs: Not on file  Physical Activity: Not on file  Stress: Not on file  Social Connections: Not on file   Past Surgical History:  Procedure Laterality Date   APPLICATION OF WOUND VAC  10/25/2019   Procedure: Application Of Wound Vac;  Surgeon: Kerin Perna, MD;  Location: [facility] OR;  Service: Open Heart Surgery;;   CANNULATION FOR ECMO (EXTRACORPOREAL MEMBRANE OXYGENATION)  10/25/2019   Procedure: Cannulation For Ecmo (Extracorporeal Membrane Oxygenation) @ 1531;  Surgeon: Donata Clay, Theron Arista, MD;  Location: [facility] OR;  Service: Open Heart Surgery;;   CANNULATION FOR ECMO (EXTRACORPOREAL MEMBRANE OXYGENATION) N/A 10/30/2019   Procedure: DECANNULATION OF ECMO (EXTRACORPOREAL MEMBRANE OXYGENATION);  Surgeon: Donata Clay, Theron Arista, MD;  Location: [facility] OR;  Service: Open Heart Surgery;  Laterality: N/A;  Pump standby   CHEST EXPLORATION  10/25/2019   Procedure: Chest Exploration  for repair  of coronary perforation (repair of LAD);  Surgeon: Donata Clay, Theron Arista, MD;  Location: [facility] OR;  Service: Open Heart Surgery;;   CORONARY CTO INTERVENTION N/A 10/25/2019   Procedure: CORONARY CTO INTERVENTION;  Surgeon: Swaziland, Peter M, MD;  Location: [facility] INVASIVE CV LAB;  Service: Cardiovascular;  Laterality: N/A;   CORONARY STENT INTERVENTION N/A 10/17/2019   Procedure: CORONARY STENT INTERVENTION;  Surgeon: Tonny Bollman, MD;  [facility] CV LAB;  Service: Cardiovascular;  Laterality: N/A;   LEFT HEART CATH AND CORONARY ANGIOGRAPHY N/A 10/17/2019   Procedure: LEFT HEART CATH AND CORONARY ANGIOGRAPHY;  Surgeon: Tonny Bollman, MD;  Location: [facility] INVASIVE CV LAB;  Service: Cardiovascular;  Laterality: N/A;   MEDIASTERNOTOMY  10/25/2019   Procedure: Median Sternotomy.;  Surgeon: Kerin Perna, MD;  Location: [facility] OR;  Service: Open Heart Surgery;;   PLACEMENT OF IMPELLA LEFT VENTRICULAR ASSIST DEVICE  10/30/2019   Procedure: Placement Of Impella Left Ventricular Assist Device 5.5 USING HEMASHIELD PLATINUM GRAFT;  Surgeon: Kerin Perna, MD;  Location: [facility] OR;  Service: Open Heart Surgery;;  graft placed into aorta   REMOVAL OF IMPELLA LEFT VENTRICULAR ASSIST DEVICE N/A 11/03/2019   Procedure: REMOVAL OF IMPELLA 5.5 LEFT VENTRICULAR ASSIST DEVICE;  Surgeon: Kerin Perna, MD;  Location: [facility] OR;  Service: Open Heart Surgery;  Laterality: N/A;   STERNAL CLOSURE N/A 10/30/2019   Procedure: Sternal Closure;  Surgeon: Kerin Perna, MD;  Location: [facility] OR;  Service: Open Heart Surgery;  Laterality: N/A;   STERNAL WIRES REMOVAL N/A 05/14/2020   Procedure: STERNAL WIRES REMOVAL;  Surgeon: Kerin Perna, MD;  Location: [facility] OR;  Service: Thoracic;  Laterality: N/A;   TEE WITHOUT CARDIOVERSION N/A 10/25/2019   Procedure: TRANSESOPHAGEAL ECHOCARDIOGRAM (TEE);  Surgeon: Donata Clay, Theron Arista, MD;  Location: [facility] OR;  Service: Open Heart Surgery;  Laterality: N/A;   TEE WITHOUT CARDIOVERSION N/A 10/30/2019    Procedure: TRANSESOPHAGEAL ECHOCARDIOGRAM (TEE);  Surgeon: Donata Clay, Theron Arista, MD;  Location: [facility] OR;  Service: Open Heart Surgery;  Laterality: N/A;   TEE WITHOUT CARDIOVERSION N/A 11/03/2019   Procedure: TRANSESOPHAGEAL ECHOCARDIOGRAM (TEE);  Surgeon: Donata Clay, Theron Arista, MD;  Location: [facility] OR;  Service: Open Heart Surgery;  Laterality: N/A;   Past Surgical History:  Procedure Laterality Date   APPLICATION OF WOUND VAC  10/25/2019   Procedure: Application Of Wound Vac;  Surgeon: Kerin Perna, MD;  Location: [facility] OR;  Service: Open Heart Surgery;;   CANNULATION FOR ECMO (EXTRACORPOREAL MEMBRANE OXYGENATION)  10/25/2019   Procedure: Cannulation For Ecmo (Extracorporeal Membrane Oxygenation) @ 1531;  Surgeon: Kerin Perna, MD;  Location: [facility] OR;  Service: Open Heart Surgery;;   CANNULATION FOR ECMO (EXTRACORPOREAL MEMBRANE OXYGENATION) N/A 10/30/2019   Procedure: DECANNULATION OF ECMO (EXTRACORPOREAL MEMBRANE OXYGENATION);  Surgeon: Donata Clay, Theron Arista, MD;  Location: [facility] OR;  Service: Open Heart Surgery;  Laterality: N/A;  Pump standby   CHEST EXPLORATION  10/25/2019   Procedure: Chest Exploration for repair of coronary perforation (repair of LAD);  Surgeon: Donata Clay, Theron Arista, MD;  Location: [facility] OR;  Service: Open Heart Surgery;;   CORONARY CTO INTERVENTION N/A 10/25/2019   Procedure: CORONARY CTO INTERVENTION;  Surgeon: Swaziland, Peter M, MD;  Location: [facility] INVASIVE CV LAB;  Service: Cardiovascular;  Laterality: N/A;   CORONARY STENT INTERVENTION N/A 10/17/2019   Procedure: CORONARY STENT INTERVENTION;  Surgeon: Tonny Bollman, MD;  Location: [facility] INVASIVE CV LAB;  Service: Cardiovascular;  Laterality: N/A;   LEFT HEART CATH  AND CORONARY ANGIOGRAPHY N/A 10/17/2019   Procedure: LEFT HEART CATH AND CORONARY ANGIOGRAPHY;  Surgeon: Tonny Bollman, MD;  Location: [facility] INVASIVE CV LAB;  Service: Cardiovascular;  Laterality: N/A;   MEDIASTERNOTOMY  10/25/2019   Procedure: Median Sternotomy.;  Surgeon: Kerin Perna, MD;   Location: [facility] OR;  Service: Open Heart Surgery;;   PLACEMENT OF IMPELLA LEFT VENTRICULAR ASSIST DEVICE  10/30/2019   Procedure: Placement Of Impella Left Ventricular Assist Device 5.5 USING HEMASHIELD PLATINUM GRAFT;  Surgeon: Kerin Perna, MD;  Location: [facility] OR;  Service: Open Heart Surgery;;  graft placed into aorta   REMOVAL OF IMPELLA LEFT VENTRICULAR ASSIST DEVICE N/A 11/03/2019   Procedure: REMOVAL OF IMPELLA 5.5 LEFT VENTRICULAR ASSIST DEVICE;  Surgeon: Kerin Perna, MD;  Location: [facility] OR;  Service: Open Heart Surgery;  Laterality: N/A;   STERNAL CLOSURE N/A 10/30/2019   Procedure: Sternal Closure;  Surgeon: Kerin Perna, MD;  Location: [facility] OR;  Service: Open Heart Surgery;  Laterality: N/A;   STERNAL WIRES REMOVAL N/A 05/14/2020   Procedure: STERNAL WIRES REMOVAL;  Surgeon: Kerin Perna, MD;  Location: [facility] OR;  Service: Thoracic;  Laterality: N/A;   TEE WITHOUT CARDIOVERSION N/A 10/25/2019   Procedure: TRANSESOPHAGEAL ECHOCARDIOGRAM (TEE);  Surgeon: Donata Clay, Theron Arista, MD;  Location: [facility] OR;  Service: Open Heart Surgery;  Laterality: N/A;   TEE WITHOUT CARDIOVERSION N/A 10/30/2019   Procedure: TRANSESOPHAGEAL ECHOCARDIOGRAM (TEE);  Surgeon: Donata Clay, Theron Arista, MD;  Location: [facility] OR;  Service: Open Heart Surgery;  Laterality: N/A;   TEE WITHOUT CARDIOVERSION N/A 11/03/2019   Procedure: TRANSESOPHAGEAL ECHOCARDIOGRAM (TEE);  Surgeon: Donata Clay, Theron Arista, MD;  Location: [facility] OR;  Service: Open Heart Surgery;  Laterality: N/A;   Past Medical History:  Diagnosis Date   Alcohol abuse    Anxiety    CAD (coronary artery disease)    Cardiac arrest (HCC)    Cardiogenic shock (HCC)    CHF (congestive heart failure) (HCC)    Cough    Dysrhythmia    GERD (gastroesophageal reflux disease)    HFrEF (heart failure with reduced ejection fraction) (HCC)    Hypertension    Hypertriglyceridemia    Ischemic cardiomyopathy    Right atrial thrombus    Tobacco abuse    BP 126/82   Pulse 94   Ht 5\' 8"   (1.727 m)   Wt 179 lb (81.2 kg)   SpO2 99%   BMI 27.22 kg/m   Opioid Risk Score:   Fall Risk Score:  `1  Depression screen PHQ 2/9     07/27/2022    2:42 PM 06/05/2022    2:15 PM 05/05/2022    2:30 PM 04/06/2022    1:10 PM 12/19/2021    2:07 PM 11/18/2021    9:13 AM 10/21/2021    3:23 PM  Depression screen PHQ 2/9  Decreased Interest 0 0 0 0 0 0 0  Down, Depressed, Hopeless 0 0 0 0 0 0 0  PHQ - 2 Score 0 0 0 0 0 0 0     Review of Systems  Constitutional: Negative.   HENT: Negative.    Eyes: Negative.   Respiratory: Negative.    Cardiovascular: Negative.   Gastrointestinal: Negative.   Endocrine: Negative.   Genitourinary: Negative.   Musculoskeletal:  Positive for arthralgias, back pain and myalgias.  Skin: Negative.   Allergic/Immunologic: Negative.   Neurological:        Tingling  Hematological:  Bruises/bleeds easily.  Plavix  Psychiatric/Behavioral: Negative.    All other systems reviewed and are negative.      Objective:    Gen: no distress, normal appearing HEENT: oral mucosa pink and moist, NCAT Cardio: Reg rate Chest: normal effort, normal rate of breathing Abd: soft, non-distended Ext: no edema Psych: pleasant, normal affect Skin: intact Neuro: Alert and oriented x3, good medical historian MSK: bony sternal protrusion palpated      Assessment & Plan:  1. Left Shoulder Pain: Conitnue HEP as Tolerated. Continue current medication regimen. Continue to monitor. 11/22/2020  2.Left Greater Trochanter Bursitis: S/P Left Hip Injection with Dr Carlis Abbott on 09/03/2020, no relief noted he reports, Conitnue to alternate with Heat an Ice Therapy. Continue to monitor. 11/22/2020  3. Bilateral  Knees chondral defects: Conitnue HEP as Tolerated. Continue current medication regimen. 11/22/2020. Discussed results of bilateral knee MRIs which showed chondral deficits. Advised application of voltaren gel. Advised icing in evenings. Did not benefit from prior  steroid taper. Referred to ortho for eval for arthroscopic debridement.  Continue voltaren gel -discussed that pain could be secondary to opioid induced testoserone deficiency -testosterone level checked and it is is low. -Discussed the risks and benefits of testosterone replacement therapy and he is interested in trying this  -PSA should be monitored while using testosterone replacement therapy as there is an increased risk of prostate cancer.  -recommended Wobenxyme -refilled oxycodone and gabapentin.  -Discussed following foods that may reduce pain: 1) Ginger (especially studied for arthritis)- reduce leukotriene production to decrease inflammation 2) Blueberries- high in phytonutrients that decrease inflammation 3) Salmon- marine omega-3s reduce joint swelling and pain 4) Pumpkin seeds- reduce inflammation 5) dark chocolate- reduces inflammation 6) turmeric- reduces inflammation 7) tart cherries - reduce pain and stiffness 8) extra virgin olive oil - its compound olecanthal helps to block prostaglandins  9) chili peppers- can be eaten or applied topically via capsaicin 10) mint- helpful for headache, muscle aches, joint pain, and itching 11) garlic- reduces inflammation  Link to further information on diet for chronic pain: http://www.bray.com/   3. Chest Wall Discomfort/ Neuropathic Pain: Conitnue Gabapentin. Continue to Monitor. 11/22/2020. Prescribed lidocaine patch.  -Cymbalta was stopped -Stop Topamax.  -encouraged follow-up with cardiologist -continue nitroglycerin PRN -continue Percocet -Discussed Qutenza as an option for neuropathic pain control. Discussed that this is a capsaicin patch, stronger than capsaicin cream. Discussed that it is currently approved for diabetic peripheral neuropathy and post-herpetic neuralgia, but that it has also shown benefit in treating other forms of neuropathy. Provided  patient with link to site to learn more about the patch: https://www.clark.biz/. Discussed that the patch would be placed in office and benefits usually last 3 months. Discussed that unintended exposure to capsaicin can cause severe irritation of eyes, mucous membranes, respiratory tract, and skin, but that Qutenza is a local treatment and does not have the systemic side effects of other nerve medications. Discussed that there may be pain, itching, erythema, and decreased sensory function associated with the application of Qutenza. Side effects usually subside within 1 week. A cold pack of analgesic medications can help with these side effects. Blood pressure can also be increased due to pain associated with administration of the patch.   -4. Chronic Pain Syndrome: :  conitnue Oxycodone 15/325 mg 6 times a day as needed for pain #180. We will continue the opioid monitoring program, this consists of regular clinic visits, examinations, urine drug screen, pill counts as well as use of [place] Controlled Substance Reporting system.  A 12 month History has been reviewed on the [place] Controlled Substance Reporting System on 11/22/2020. Discussed risks and benefits of opioid medications.  -Provided with a pain relief journal and discussed that it contains foods and lifestyle tips to naturally help to improve pain. Discussed that these lifestyle strategies are also very good for health unlike some medications which can have negative side effects. Discussed that the act of keeping a journal can be therapeutic and helpful to realize patterns what helps to trigger and alleviate pain.   -Discussed following foods that may reduce pain: 1) Ginger (especially studied for arthritis)- reduce leukotriene production to decrease inflammation 2) Blueberries- high in phytonutrients that decrease inflammation 3) Salmon- marine omega-3s reduce joint swelling and pain 4) Pumpkin seeds- reduce inflammation 5) dark  chocolate- reduces inflammation 6) turmeric- reduces inflammation 7) tart cherries - reduce pain and stiffness 8) extra virgin olive oil - its compound olecanthal helps to block prostaglandins  9) chili peppers- can be eaten or applied topically via capsaicin 10) mint- helpful for headache, muscle aches, joint pain, and itching 11) garlic- reduces inflammation  Link to further information on diet for chronic pain: http://www.bray.com/   5) Insomnia: continue amitriptyline to 25mg  HS, can try this instead of trazodone.   6) Low back pain: -continue percocet -continue seeing chiropractor -Discussed current symptoms of pain and history of pain.  -Discussed benefits of exercise in reducing pain. -Discussed following foods that may reduce pain: 1) Ginger (especially studied for arthritis)- reduce leukotriene production to decrease inflammation 2) Blueberries- high in phytonutrients that decrease inflammation 3) Salmon- marine omega-3s reduce joint swelling and pain 4) Pumpkin seeds- reduce inflammation 5) dark chocolate- reduces inflammation 6) turmeric- reduces inflammation 7) tart cherries - reduce pain and stiffness 8) extra virgin olive oil - its compound olecanthal helps to block prostaglandins  9) chili peppers- can be eaten or applied topically via capsaicin 10) mint- helpful for headache, muscle aches, joint pain, and itching 11) garlic- reduces inflammation  Link to further information on diet for chronic pain: http://www.bray.com/  -Discussed results of MRI which shows disc degeneration, spinal stenosis, and bone marrow edema -discussed risks and benefits of extracorporeal shockwave therapy and will plan for this next visit.  -Given worsening pain and radiation of pain into legs intermittently with occasional weakness, new MRI ordered,  will follow-up on this with April  -discussed that hemp products not recommended while receiving opioids as may show up as THC in his urine sample -discussed that muscle and ligament pathology can also cause severe pain at times -encouraged continued exercise and working to send blood flow to injured area and promote healing -Provided with a pain relief journal and discussed that it contains foods and lifestyle tips to naturally help to improve pain. Discussed that these lifestyle strategies are also very good for health unlike some medications which can have negative side effects. Discussed that the act of keeping a journal can be therapeutic and helpful to realize patterns what helps to trigger and alleviate pain.   -Wobenzyme -Discussed response to decrease in atorvastatin. Discussed that cholesterol is important for muscle function and lowering cholesterol levels too much is associated with joint pain in some patients. Discussed that some patients feel relief as soon as decreasing statin medications, whereas for others it takes a longer time to see if pain improves or not. Recommended repeating his lipid panel prior to when he is due for his next refill so that we make sure his LDL does  not increase above 100.   7) Cervical radiuculitis: -Discussed that cervical XR showed disc degeneration  8) Testosterone deficiency Discussed that testosterone level has improved with his exercise and dietary changes -prescribed vitamin D  9) Trigger finger, left -recommended brace -if this does not help, recommend orthopedic follow-up for trigger point injection  10) Prostate cancer screening: -PSA check today.   11) HLD: -reviewed last lipid panel with him -discussed checking lipid panel with cardiologist.  -recommend decreasing atorvastatin to 20mg , but should discuss with cardiologist.   12. Suboptimal vitamin D -prescribed 50,000U ergocalciferol once per week for 7 weeks -now can continue over  the counter D3  13) Trigger finger -sent him a picture of brace he can purchase on [place] -referred to surgery for steroid injection if brace fails to help  14) Insomnia -increase amitriptyline 50mg  HS  -discussed red light therapy -Try to go outside near sunrise -Get exercise during the day.  -Turn off all devices an hour before bedtime.  -Teas that can benefit: chamomile, valerian root, Brahmi (Bacopa) -Can consider over the counter melatonin, magnesium, and/or L-theanine. Melatonin is an anti-oxidant with multiple health benefits. Magnesium is involved in greater than 300 enzymatic reactions in the body and most of [place] are deficient as our soil is often depleted. There are 7 different types of magnesium- Bioptemizer's is a supplement with all 7 types, and each has unique benefits. Magnesium can also help with constipation and anxiety.  -Pistachios naturally increase the production of melatonin -[employer] bamboo bed sheets are free from toxic chemicals.  -Tart cherry juice or a tart cherry supplement can improve sleep and soreness post-workout   15) Impaired memory: -discussed that this has been present since his surgery

## 2022-10-24 ENCOUNTER — Other Ambulatory Visit (HOSPITAL_COMMUNITY): Payer: Self-pay | Admitting: Cardiology

## 2022-10-26 ENCOUNTER — Other Ambulatory Visit: Payer: Self-pay | Admitting: Physical Medicine and Rehabilitation

## 2022-10-26 ENCOUNTER — Other Ambulatory Visit (HOSPITAL_COMMUNITY): Payer: Self-pay | Admitting: Cardiology

## 2022-10-26 MED ORDER — OXYCODONE HCL 15 MG PO TABS: 15.0000 mg | ORAL_TABLET | Freq: Four times a day (QID) | ORAL | 0 refills | Status: DC | PRN

## 2022-10-30 ENCOUNTER — Telehealth: Payer: Self-pay | Admitting: *Deleted

## 2022-11-02 ENCOUNTER — Ambulatory Visit (HOSPITAL_COMMUNITY)
Admission: RE | Admit: 2022-11-02 | Discharge: 2022-11-02 | Disposition: A | Discharge status: Home / Self Care | Payer: Medicaid Other | Source: Ambulatory Visit | Attending: Internal Medicine

## 2022-11-02 ENCOUNTER — Ambulatory Visit (HOSPITAL_COMMUNITY)
Admission: RE | Admit: 2022-11-02 | Discharge: 2022-11-02 | Disposition: A | Discharge status: Home / Self Care | Payer: Medicaid Other | Source: Ambulatory Visit | Attending: Family Medicine | Admitting: Family Medicine

## 2022-11-02 DIAGNOSIS — I219 Acute myocardial infarction, unspecified: Secondary | ICD-10-CM | POA: Insufficient documentation

## 2022-11-02 DIAGNOSIS — Z87891 Personal history of nicotine dependence: Secondary | ICD-10-CM | POA: Diagnosis not present

## 2022-11-02 DIAGNOSIS — I5022 Chronic systolic (congestive) heart failure: Secondary | ICD-10-CM

## 2022-11-02 DIAGNOSIS — I110 Hypertensive heart disease with heart failure: Secondary | ICD-10-CM | POA: Insufficient documentation

## 2022-11-02 DIAGNOSIS — I2510 Atherosclerotic heart disease of native coronary artery without angina pectoris: Secondary | ICD-10-CM | POA: Diagnosis not present

## 2022-11-02 DIAGNOSIS — E785 Hyperlipidemia, unspecified: Secondary | ICD-10-CM | POA: Insufficient documentation

## 2022-11-02 LAB — ECHOCARDIOGRAM COMPLETE
AR max vel: 2.49 cm2
AV Area VTI: 2.84 cm2
AV Area mean vel: 2.42 cm2
AV Mean grad: 3 mmHg
AV Peak grad: 6.8 mmHg
Ao pk vel: 1.3 m/s
Area-P 1/2: 3.89 cm2
Calc EF: 54.7 %
MV M vel: 4.51 m/s
MV Peak grad: 81.4 mmHg
S' Lateral: 3.4 cm
Single Plane A2C EF: 58.9 %
Single Plane A4C EF: 51.1 %

## 2022-11-02 LAB — LIPID PANEL
Cholesterol: 132 mg/dL (ref 0–200)
HDL: 42 mg/dL (ref >40)
LDL Cholesterol: 69 mg/dL (ref 0–99)
Total CHOL/HDL Ratio: 3.1 RATIO
Triglycerides: 103 mg/dL (ref <150)
VLDL: 21 mg/dL (ref 0–40)

## 2022-11-02 LAB — HEPATIC FUNCTION PANEL
ALT: 18 U/L (ref 0–44)
AST: 20 U/L (ref 15–41)
Albumin: 4.2 g/dL (ref 3.5–5.0)
Alkaline Phosphatase: 58 U/L (ref 38–126)
Bilirubin, Direct: <0.1 mg/dL (ref 0.0–0.2)
Total Bilirubin: 0.4 mg/dL (ref 0.3–1.2)
Total Protein: 7.3 g/dL (ref 6.5–8.1)

## 2022-11-03 ENCOUNTER — Encounter: Payer: Medicaid Other | Attending: Physical Medicine and Rehabilitation | Admitting: Registered Nurse

## 2022-11-03 ENCOUNTER — Telehealth (HOSPITAL_COMMUNITY): Payer: Self-pay

## 2022-11-03 ENCOUNTER — Encounter: Payer: Self-pay | Admitting: Registered Nurse

## 2022-11-03 VITALS — BP 130/85 | HR 90 | Ht 68.0 in | Wt 178.0 lb

## 2022-11-03 DIAGNOSIS — G8929 Other chronic pain: Secondary | ICD-10-CM | POA: Diagnosis present

## 2022-11-03 DIAGNOSIS — M170 Bilateral primary osteoarthritis of knee: Secondary | ICD-10-CM | POA: Insufficient documentation

## 2022-11-03 DIAGNOSIS — M25511 Pain in right shoulder: Secondary | ICD-10-CM | POA: Insufficient documentation

## 2022-11-03 DIAGNOSIS — G894 Chronic pain syndrome: Secondary | ICD-10-CM | POA: Diagnosis present

## 2022-11-03 DIAGNOSIS — M545 Low back pain, unspecified: Secondary | ICD-10-CM | POA: Diagnosis present

## 2022-11-03 DIAGNOSIS — Z5181 Encounter for therapeutic drug level monitoring: Secondary | ICD-10-CM | POA: Diagnosis present

## 2022-11-03 DIAGNOSIS — M25512 Pain in left shoulder: Secondary | ICD-10-CM | POA: Insufficient documentation

## 2022-11-03 DIAGNOSIS — Z79899 Other long term (current) drug therapy: Secondary | ICD-10-CM | POA: Diagnosis present

## 2022-11-03 MED ORDER — AMITRIPTYLINE HCL 25 MG PO TABS: 25.0000 mg | ORAL_TABLET | Freq: Every day | ORAL | 3 refills | Status: DC

## 2022-11-03 MED ORDER — OXYCODONE HCL 15 MG PO TABS: 15.0000 mg | ORAL_TABLET | Freq: Four times a day (QID) | ORAL | 0 refills | Status: DC | PRN

## 2022-11-03 NOTE — Telephone Encounter (Addendum)
Pt aware, agreeable, and verbalized understanding   ----- Message from Laurey Morale, MD sent at 11/02/2022  2:56 PM EDT ----- EF low normal, no changes

## 2022-11-03 NOTE — Progress Notes (Unsigned)
Subjective:    Patient ID: William Crawford, male    DOB: Oct 17, 1970, 53 y.o.   MRN: [id number]  HPI: William Crawford is a 52 y.o. male who returns for follow up appointment for chronic pain and medication refill. states *** pain is located in  ***. rates pain ***. current exercise regime is walking and performing stretching exercises.  William Crawford Morphine equivalent is *** MME.   Last UDS was Performed on 07/27/2022, it was consistent.      Pain Inventory Average Pain 7 Pain Right Now 7 My pain is constant, sharp, burning, stabbing, tingling, and aching  In the last 24 hours, has pain interfered with the following? General activity 5 Relation with others 0 Enjoyment of life 5 What TIME of day is your pain at its worst? morning , daytime, and night Sleep (in general) Poor  Pain is worse with: walking, bending, sitting, inactivity, standing, and some activites Pain improves with: medication and heat Relief from Meds: 8  Family History  Problem Relation Age of Onset   Heart attack Father    Heart disease Father    Heart attack Paternal Grandfather    Heart disease Paternal Grandfather    Heart attack Paternal Uncle    Heart disease Paternal Uncle    Heart attack Paternal Uncle    Heart disease Paternal Uncle    Social History   Socioeconomic History   Marital status: Single    Spouse name: Not on file   Number of children: Not on file   Years of education: Not on file   Highest education level: Not on file  Occupational History   Not on file  Tobacco Use   Smoking status: Former    Types: Cigarettes    Quit date: 10/23/2019    Years since quitting: 3.0   Smokeless tobacco: Never  Vaping Use   Vaping Use: Never used  Substance and Sexual Activity   Alcohol use: Not Currently    Comment: QUIT  IN MAY [id number]   Drug use: No   Sexual activity: Not on file  Other Topics Concern   Not on file  Social History Narrative   Not on file   Social Determinants of  Health   Financial Resource Strain: Not on file  Food Insecurity: Not on file  Transportation Needs: Not on file  Physical Activity: Not on file  Stress: Not on file  Social Connections: Not on file   Past Surgical History:  Procedure Laterality Date   APPLICATION OF WOUND VAC  10/25/2019   Procedure: Application Of Wound Vac;  Surgeon: Kerin Perna, MD;  Location: [facility] OR;  Service: Open Heart Surgery;;   CANNULATION FOR ECMO (EXTRACORPOREAL MEMBRANE OXYGENATION)  10/25/2019   Procedure: Cannulation For Ecmo (Extracorporeal Membrane Oxygenation) @ 1531;  Surgeon: Donata Clay, Theron Arista, MD;  Location: [facility] OR;  Service: Open Heart Surgery;;   CANNULATION FOR ECMO (EXTRACORPOREAL MEMBRANE OXYGENATION) N/A 10/30/2019   Procedure: DECANNULATION OF ECMO (EXTRACORPOREAL MEMBRANE OXYGENATION);  Surgeon: Donata Clay, Theron Arista, MD;  Location: [facility] OR;  Service: Open Heart Surgery;  Laterality: N/A;  Pump standby   CHEST EXPLORATION  10/25/2019   Procedure: Chest Exploration for repair of coronary perforation (repair of LAD);  Surgeon: Donata Clay, Theron Arista, MD;  Location: [facility] OR;  Service: Open Heart Surgery;;   CORONARY CTO INTERVENTION N/A 10/25/2019   Procedure: CORONARY CTO INTERVENTION;  Surgeon: Swaziland, Peter M, MD;  Location: [facility] INVASIVE CV LAB;  Service: Cardiovascular;  Laterality: N/A;   CORONARY STENT INTERVENTION N/A 10/17/2019   Procedure: CORONARY STENT INTERVENTION;  Surgeon: Tonny Bollman, MD;  Location: [facility] INVASIVE CV LAB;  Service: Cardiovascular;  Laterality: N/A;   LEFT HEART CATH AND CORONARY ANGIOGRAPHY N/A 10/17/2019   Procedure: LEFT HEART CATH AND CORONARY ANGIOGRAPHY;  Surgeon: Tonny Bollman, MD;  Location: [facility] INVASIVE CV LAB;  Service: Cardiovascular;  Laterality: N/A;   MEDIASTERNOTOMY  10/25/2019   Procedure: Median Sternotomy.;  Surgeon: Kerin Perna, MD;  Location: [facility] OR;  Service: Open Heart Surgery;;   PLACEMENT OF IMPELLA LEFT VENTRICULAR ASSIST DEVICE  10/30/2019   Procedure: Placement  Of Impella Left Ventricular Assist Device 5.5 USING HEMASHIELD PLATINUM GRAFT;  Surgeon: Kerin Perna, MD;  Location: [facility] OR;  Service: Open Heart Surgery;;  graft placed into aorta   REMOVAL OF IMPELLA LEFT VENTRICULAR ASSIST DEVICE N/A 11/03/2019   Procedure: REMOVAL OF IMPELLA 5.5 LEFT VENTRICULAR ASSIST DEVICE;  Surgeon: Kerin Perna, MD;  Location: [facility] OR;  Service: Open Heart Surgery;  Laterality: N/A;   STERNAL CLOSURE N/A 10/30/2019   Procedure: Sternal Closure;  Surgeon: Kerin Perna, MD;  Location: [facility] OR;  Service: Open Heart Surgery;  Laterality: N/A;   STERNAL WIRES REMOVAL N/A 05/14/2020   Procedure: STERNAL WIRES REMOVAL;  Surgeon: Kerin Perna, MD;  Location: [facility] OR;  Service: Thoracic;  Laterality: N/A;   TEE WITHOUT CARDIOVERSION N/A 10/25/2019   Procedure: TRANSESOPHAGEAL ECHOCARDIOGRAM (TEE);  Surgeon: Donata Clay, Theron Arista, MD;  Location: [facility] OR;  Service: Open Heart Surgery;  Laterality: N/A;   TEE WITHOUT CARDIOVERSION N/A 10/30/2019   Procedure: TRANSESOPHAGEAL ECHOCARDIOGRAM (TEE);  Surgeon: Donata Clay, Theron Arista, MD;  Location: [facility] OR;  Service: Open Heart Surgery;  Laterality: N/A;   TEE WITHOUT CARDIOVERSION N/A 11/03/2019   Procedure: TRANSESOPHAGEAL ECHOCARDIOGRAM (TEE);  Surgeon: Donata Clay, Theron Arista, MD;  Location: [facility] OR;  Service: Open Heart Surgery;  Laterality: N/A;   Past Surgical History:  Procedure Laterality Date   APPLICATION OF WOUND VAC  10/25/2019   Procedure: Application Of Wound Vac;  Surgeon: Kerin Perna, MD;  Location: [facility] OR;  Service: Open Heart Surgery;;   CANNULATION FOR ECMO (EXTRACORPOREAL MEMBRANE OXYGENATION)  10/25/2019   Procedure: Cannulation For Ecmo (Extracorporeal Membrane Oxygenation) @ 1531;  Surgeon: Kerin Perna, MD;  Location: [facility] OR;  Service: Open Heart Surgery;;   CANNULATION FOR ECMO (EXTRACORPOREAL MEMBRANE OXYGENATION) N/A 10/30/2019   Procedure: DECANNULATION OF ECMO (EXTRACORPOREAL MEMBRANE OXYGENATION);  Surgeon: Donata Clay,  Theron Arista, MD;  Location: [facility] OR;  Service: Open Heart Surgery;  Laterality: N/A;  Pump standby   CHEST EXPLORATION  10/25/2019   Procedure: Chest Exploration for repair of coronary perforation (repair of LAD);  Surgeon: Donata Clay, Theron Arista, MD;  Location: [facility] OR;  Service: Open Heart Surgery;;   CORONARY CTO INTERVENTION N/A 10/25/2019   Procedure: CORONARY CTO INTERVENTION;  Surgeon: Swaziland, Peter M, MD;  Location: [facility] INVASIVE CV LAB;  Service: Cardiovascular;  Laterality: N/A;   CORONARY STENT INTERVENTION N/A 10/17/2019   Procedure: CORONARY STENT INTERVENTION;  Surgeon: Tonny Bollman, MD;  Location: [facility] INVASIVE CV LAB;  Service: Cardiovascular;  Laterality: N/A;   LEFT HEART CATH AND CORONARY ANGIOGRAPHY N/A 10/17/2019   Procedure: LEFT HEART CATH AND CORONARY ANGIOGRAPHY;  Surgeon: Tonny Bollman, MD;  Location: [facility] INVASIVE CV LAB;  Service: Cardiovascular;  Laterality: N/A;   MEDIASTERNOTOMY  10/25/2019   Procedure: Median Sternotomy.;  Surgeon: Kerin Perna, MD;  Location: [facility] OR;  Service: Open Heart Surgery;;   PLACEMENT OF IMPELLA LEFT VENTRICULAR ASSIST DEVICE  10/30/2019   Procedure: Placement Of Impella Left Ventricular Assist Device 5.5 USING HEMASHIELD PLATINUM GRAFT;  Surgeon: Kerin Perna, MD;  Location: [facility] OR;  Service: Open Heart Surgery;;  graft placed into aorta   REMOVAL OF IMPELLA LEFT VENTRICULAR ASSIST DEVICE N/A 11/03/2019   Procedure: REMOVAL OF IMPELLA 5.5 LEFT VENTRICULAR ASSIST DEVICE;  Surgeon: Kerin Perna, MD;  Location: [facility] OR;  Service: Open Heart Surgery;  Laterality: N/A;   STERNAL CLOSURE N/A 10/30/2019   Procedure: Sternal Closure;  Surgeon: Kerin Perna, MD;  Location: [facility] OR;  Service: Open Heart Surgery;  Laterality: N/A;   STERNAL WIRES REMOVAL N/A 05/14/2020   Procedure: STERNAL WIRES REMOVAL;  Surgeon: Kerin Perna, MD;  Location: [facility] OR;  Service: Thoracic;  Laterality: N/A;   TEE WITHOUT CARDIOVERSION N/A 10/25/2019   Procedure: TRANSESOPHAGEAL  ECHOCARDIOGRAM (TEE);  Surgeon: Donata Clay, Theron Arista, MD;  Location: [facility] OR;  Service: Open Heart Surgery;  Laterality: N/A;   TEE WITHOUT CARDIOVERSION N/A 10/30/2019   Procedure: TRANSESOPHAGEAL ECHOCARDIOGRAM (TEE);  Surgeon: Donata Clay, Theron Arista, MD;  Location: [facility] OR;  Service: Open Heart Surgery;  Laterality: N/A;   TEE WITHOUT CARDIOVERSION N/A 11/03/2019   Procedure: TRANSESOPHAGEAL ECHOCARDIOGRAM (TEE);  Surgeon: Donata Clay, Theron Arista, MD;  Location: [facility] OR;  Service: Open Heart Surgery;  Laterality: N/A;   Past Medical History:  Diagnosis Date   Alcohol abuse    Anxiety    CAD (coronary artery disease)    Cardiac arrest (HCC)    Cardiogenic shock (HCC)    CHF (congestive heart failure) (HCC)    Cough    Dysrhythmia    GERD (gastroesophageal reflux disease)    HFrEF (heart failure with reduced ejection fraction) (HCC)    Hypertension    Hypertriglyceridemia    Ischemic cardiomyopathy    Right atrial thrombus    Tobacco abuse    Ht 5\' 8"  (1.727 m)   Wt 178 lb (80.7 kg)   BMI 27.06 kg/m   Opioid Risk Score:   Fall Risk Score:  `1  Depression screen PHQ 2/9     10/09/2022   11:20 AM 07/27/2022    2:42 PM 06/05/2022    2:15 PM 05/05/2022    2:30 PM 04/06/2022    1:10 PM 12/19/2021    2:07 PM 11/18/2021    9:13 AM  Depression screen PHQ 2/9  Decreased Interest 0 0 0 0 0 0 0  Down, Depressed, Hopeless 0 0 0 0 0 0 0  PHQ - 2 Score 0 0 0 0 0 0 0  Altered sleeping 0        PHQ-9 Score 0          Review of Systems  Musculoskeletal:  Positive for back pain.       Pain in both shoulder, pain in both knees  All other systems reviewed and are negative.     Objective:   Physical Exam        Assessment & Plan:  1. Right  Shoulder Pain: Continue HEP as Tolerated. Continue current medication regimen. Continue to monitor. 08/02/2022 2.Left Greater Trochanter Bursitis: No complaints today. S/P Left Hip Injection with Dr Carlis Abbott on 09/03/2020, no relief noted he reports, Continue to  alternate with Heat an Ice Therapy. Continue to monitor. 08/02/2022 3. Bilateral  Knees OA: Continue HEP as Tolerated. Continue current medication regimen. 08/02/2022 3. Chest Wall Discomfort/  Neuropathic Pain: No complaints today. Continue Gabapentin. Continue to Monitor. 08/02/2022 4.Chronic Low Back Pain without sciatica: Continue HEP and Continue current medication regimen. Continue to monitor. 08/02/2022 5. Chronic Pain Syndrome: : Refilled: Oxycodone 15 mg one tablet every 6 hours  as needed for pain #120. We will continue the opioid monitoring program, this consists of regular clinic visits, examinations, urine drug screen, pill counts as well as use of [place] Controlled Substance Reporting system. A 12 month History has been reviewed on the [place] Controlled Substance Reporting System on 08/02/2022.   F/U in 1 month

## 2022-11-27 ENCOUNTER — Other Ambulatory Visit: Payer: Self-pay | Admitting: Physical Medicine and Rehabilitation

## 2022-12-21 ENCOUNTER — Encounter: Payer: Self-pay | Admitting: Registered Nurse

## 2022-12-21 ENCOUNTER — Encounter: Payer: Medicaid Other | Attending: Physical Medicine and Rehabilitation | Admitting: Registered Nurse

## 2022-12-21 VITALS — BP 130/82 | HR 81 | Ht 68.0 in | Wt 186.0 lb

## 2022-12-21 DIAGNOSIS — M25512 Pain in left shoulder: Secondary | ICD-10-CM | POA: Diagnosis present

## 2022-12-21 DIAGNOSIS — G8929 Other chronic pain: Secondary | ICD-10-CM

## 2022-12-21 DIAGNOSIS — M25511 Pain in right shoulder: Secondary | ICD-10-CM | POA: Insufficient documentation

## 2022-12-21 DIAGNOSIS — G894 Chronic pain syndrome: Secondary | ICD-10-CM

## 2022-12-21 DIAGNOSIS — Z79899 Other long term (current) drug therapy: Secondary | ICD-10-CM | POA: Diagnosis present

## 2022-12-21 DIAGNOSIS — M170 Bilateral primary osteoarthritis of knee: Secondary | ICD-10-CM | POA: Diagnosis present

## 2022-12-21 DIAGNOSIS — Z5181 Encounter for therapeutic drug level monitoring: Secondary | ICD-10-CM | POA: Diagnosis present

## 2022-12-21 DIAGNOSIS — M545 Low back pain, unspecified: Secondary | ICD-10-CM | POA: Insufficient documentation

## 2022-12-21 MED ORDER — OXYCODONE HCL 15 MG PO TABS: 15.0000 mg | ORAL_TABLET | Freq: Four times a day (QID) | ORAL | 0 refills | Status: DC | PRN

## 2022-12-21 NOTE — Progress Notes (Signed)
Subjective:    Patient ID: William Crawford, male    DOB: [birth year], 52 y.o.   MRN: [id number]  HPI: William Crawford is a 52 y.o. male who returns for follow up appointment for chronic pain and medication refill. He states his pain is located in his bilateral shoulders, lower back pain and bilateral knee pain. He also reports bilateral groin pain with walking, he denies falling and he refuses X-ray at this time. He will call office if he changes his mind, he verbalizes understanding. He rates his pain 8. His current exercise regime is walking and performing stretching exercises.  [name] Morphine equivalent is 90.00 MME.   Last UDS was Performed on 07/27/2022, it was consistent.     Pain Inventory Average Pain 7 Pain Right Now 8 My pain is sharp, burning, stabbing, tingling, and aching  In the last 24 hours, has pain interfered with the following? General activity 8 Relation with others 7 Enjoyment of life 8 What TIME of day is your pain at its worst? morning , daytime, evening, and night Sleep (in general) Fair  Pain is worse with: bending, sitting, inactivity, and some activites Pain improves with: heat/ice and medication Relief from Meds: 5  Family History  Problem Relation Age of Onset   Heart attack Father    Heart disease Father    Heart attack Paternal Grandfather    Heart disease Paternal Grandfather    Heart attack Paternal Uncle    Heart disease Paternal Uncle    Heart attack Paternal Uncle    Heart disease Paternal Uncle    Social History   Socioeconomic History   Marital status: Single    Spouse name: Not on file   Number of children: Not on file   Years of education: Not on file   Highest education level: Not on file  Occupational History   Not on file  Tobacco Use   Smoking status: Former    Current packs/day: 0.00    Types: Cigarettes    Quit date: 10/23/2019    Years since quitting: 3.1   Smokeless tobacco: Never  Vaping Use   Vaping  status: Never Used  Substance and Sexual Activity   Alcohol use: Not Currently    Comment: QUIT  IN MAY 40981   Drug use: No   Sexual activity: Not on file  Other Topics Concern   Not on file  Social History Narrative   Not on file   Social Determinants of Health   Financial Resource Strain: Not on file  Food Insecurity: Not on file  Transportation Needs: Not on file  Physical Activity: Not on file  Stress: Not on file  Social Connections: Not on file   Past Surgical History:  Procedure Laterality Date   APPLICATION OF WOUND VAC  10/25/2019   Procedure: Application Of Wound Vac;  Surgeon: Kerin Perna, MD;  Location: [facility] OR;  Service: Open Heart Surgery;;   CANNULATION FOR ECMO (EXTRACORPOREAL MEMBRANE OXYGENATION)  10/25/2019   Procedure: Cannulation For Ecmo (Extracorporeal Membrane Oxygenation) @ 1531;  Surgeon: Donata Clay, Theron Arista, MD;  Location: [facility] OR;  Service: Open Heart Surgery;;   CANNULATION FOR ECMO (EXTRACORPOREAL MEMBRANE OXYGENATION) N/A 10/30/2019   Procedure: DECANNULATION OF ECMO (EXTRACORPOREAL MEMBRANE OXYGENATION);  Surgeon: Donata Clay, Theron Arista, MD;  Location: [facility] OR;  Service: Open Heart Surgery;  Laterality: N/A;  Pump standby   CHEST EXPLORATION  10/25/2019   Procedure: Chest Exploration for repair of coronary perforation (repair of  LAD);  Surgeon: Kerin Perna, MD;  Location: [facility] OR;  Service: Open Heart Surgery;;   CORONARY CTO INTERVENTION N/A 10/25/2019   Procedure: CORONARY CTO INTERVENTION;  Surgeon: Swaziland, Peter M, MD;  Location: [facility] INVASIVE CV LAB;  Service: Cardiovascular;  Laterality: N/A;   CORONARY STENT INTERVENTION N/A 10/17/2019   Procedure: CORONARY STENT INTERVENTION;  Surgeon: Tonny Bollman, MD;  Location: [facility] INVASIVE CV LAB;  Service: Cardiovascular;  Laterality: N/A;   LEFT HEART CATH AND CORONARY ANGIOGRAPHY N/A 10/17/2019   Procedure: LEFT HEART CATH AND CORONARY ANGIOGRAPHY;  Surgeon: Tonny Bollman, MD;  Location: [facility] INVASIVE CV LAB;  Service:  Cardiovascular;  Laterality: N/A;   MEDIASTERNOTOMY  10/25/2019   Procedure: Median Sternotomy.;  Surgeon: Kerin Perna, MD;  Location: [facility] OR;  Service: Open Heart Surgery;;   PLACEMENT OF IMPELLA LEFT VENTRICULAR ASSIST DEVICE  10/30/2019   Procedure: Placement Of Impella Left Ventricular Assist Device 5.5 USING HEMASHIELD PLATINUM GRAFT;  Surgeon: Kerin Perna, MD;  Location: [facility] OR;  Service: Open Heart Surgery;;  graft placed into aorta   REMOVAL OF IMPELLA LEFT VENTRICULAR ASSIST DEVICE N/A 11/03/2019   Procedure: REMOVAL OF IMPELLA 5.5 LEFT VENTRICULAR ASSIST DEVICE;  Surgeon: Kerin Perna, MD;  Location: [facility] OR;  Service: Open Heart Surgery;  Laterality: N/A;   STERNAL CLOSURE N/A 10/30/2019   Procedure: Sternal Closure;  Surgeon: Kerin Perna, MD;  Location: [facility] OR;  Service: Open Heart Surgery;  Laterality: N/A;   STERNAL WIRES REMOVAL N/A 05/14/2020   Procedure: STERNAL WIRES REMOVAL;  Surgeon: Kerin Perna, MD;  Location: [facility] OR;  Service: Thoracic;  Laterality: N/A;   TEE WITHOUT CARDIOVERSION N/A 10/25/2019   Procedure: TRANSESOPHAGEAL ECHOCARDIOGRAM (TEE);  Surgeon: Donata Clay, Theron Arista, MD;  Location: [facility] OR;  Service: Open Heart Surgery;  Laterality: N/A;   TEE WITHOUT CARDIOVERSION N/A 10/30/2019   Procedure: TRANSESOPHAGEAL ECHOCARDIOGRAM (TEE);  Surgeon: Donata Clay, Theron Arista, MD;  Location: [facility] OR;  Service: Open Heart Surgery;  Laterality: N/A;   TEE WITHOUT CARDIOVERSION N/A 11/03/2019   Procedure: TRANSESOPHAGEAL ECHOCARDIOGRAM (TEE);  Surgeon: Donata Clay, Theron Arista, MD;  Location: [facility] OR;  Service: Open Heart Surgery;  Laterality: N/A;   Past Surgical History:  Procedure Laterality Date   APPLICATION OF WOUND VAC  10/25/2019   Procedure: Application Of Wound Vac;  Surgeon: Kerin Perna, MD;  Location: [facility] OR;  Service: Open Heart Surgery;;   CANNULATION FOR ECMO (EXTRACORPOREAL MEMBRANE OXYGENATION)  10/25/2019   Procedure: Cannulation For Ecmo (Extracorporeal Membrane  Oxygenation) @ 1531;  Surgeon: Kerin Perna, MD;  Location: [facility] OR;  Service: Open Heart Surgery;;   CANNULATION FOR ECMO (EXTRACORPOREAL MEMBRANE OXYGENATION) N/A 10/30/2019   Procedure: DECANNULATION OF ECMO (EXTRACORPOREAL MEMBRANE OXYGENATION);  Surgeon: Donata Clay, Theron Arista, MD;  Location: [facility] OR;  Service: Open Heart Surgery;  Laterality: N/A;  Pump standby   CHEST EXPLORATION  10/25/2019   Procedure: Chest Exploration for repair of coronary perforation (repair of LAD);  Surgeon: Donata Clay, Theron Arista, MD;  Location: [facility] OR;  Service: Open Heart Surgery;;   CORONARY CTO INTERVENTION N/A 10/25/2019   Procedure: CORONARY CTO INTERVENTION;  Surgeon: Swaziland, Peter M, MD;  Location: [facility] INVASIVE CV LAB;  Service: Cardiovascular;  Laterality: N/A;   CORONARY STENT INTERVENTION N/A 10/17/2019   Procedure: CORONARY STENT INTERVENTION;  Surgeon: Tonny Bollman, MD;  Location: [facility] INVASIVE CV LAB;  Service: Cardiovascular;  Laterality: N/A;   LEFT HEART CATH AND CORONARY ANGIOGRAPHY N/A 10/17/2019  Procedure: LEFT HEART CATH AND CORONARY ANGIOGRAPHY;  Surgeon: Tonny Bollman, MD;  Location: [facility] INVASIVE CV LAB;  Service: Cardiovascular;  Laterality: N/A;   MEDIASTERNOTOMY  10/25/2019   Procedure: Median Sternotomy.;  Surgeon: Kerin Perna, MD;  Location: [facility] OR;  Service: Open Heart Surgery;;   PLACEMENT OF IMPELLA LEFT VENTRICULAR ASSIST DEVICE  10/30/2019   Procedure: Placement Of Impella Left Ventricular Assist Device 5.5 USING HEMASHIELD PLATINUM GRAFT;  Surgeon: Kerin Perna, MD;  Location: [facility] OR;  Service: Open Heart Surgery;;  graft placed into aorta   REMOVAL OF IMPELLA LEFT VENTRICULAR ASSIST DEVICE N/A 11/03/2019   Procedure: REMOVAL OF IMPELLA 5.5 LEFT VENTRICULAR ASSIST DEVICE;  Surgeon: Kerin Perna, MD;  Location: [facility] OR;  Service: Open Heart Surgery;  Laterality: N/A;   STERNAL CLOSURE N/A 10/30/2019   Procedure: Sternal Closure;  Surgeon: Kerin Perna, MD;  Location: [facility] OR;  Service: Open  Heart Surgery;  Laterality: N/A;   STERNAL WIRES REMOVAL N/A 05/14/2020   Procedure: STERNAL WIRES REMOVAL;  Surgeon: Kerin Perna, MD;  Location: [facility] OR;  Service: Thoracic;  Laterality: N/A;   TEE WITHOUT CARDIOVERSION N/A 10/25/2019   Procedure: TRANSESOPHAGEAL ECHOCARDIOGRAM (TEE);  Surgeon: Donata Clay, Theron Arista, MD;  Location: [facility] OR;  Service: Open Heart Surgery;  Laterality: N/A;   TEE WITHOUT CARDIOVERSION N/A 10/30/2019   Procedure: TRANSESOPHAGEAL ECHOCARDIOGRAM (TEE);  Surgeon: Donata Clay, Theron Arista, MD;  Location: [facility] OR;  Service: Open Heart Surgery;  Laterality: N/A;   TEE WITHOUT CARDIOVERSION N/A 11/03/2019   Procedure: TRANSESOPHAGEAL ECHOCARDIOGRAM (TEE);  Surgeon: Donata Clay, Theron Arista, MD;  Location: [facility] OR;  Service: Open Heart Surgery;  Laterality: N/A;   Past Medical History:  Diagnosis Date   Alcohol abuse    Anxiety    CAD (coronary artery disease)    Cardiac arrest (HCC)    Cardiogenic shock (HCC)    CHF (congestive heart failure) (HCC)    Cough    Dysrhythmia    GERD (gastroesophageal reflux disease)    HFrEF (heart failure with reduced ejection fraction) (HCC)    Hypertension    Hypertriglyceridemia    Ischemic cardiomyopathy    Right atrial thrombus    Tobacco abuse    BP 130/82   Pulse 81   Ht 5\' 8"  (1.727 m)   Wt 186 lb (84.4 kg)   SpO2 98%   BMI 28.28 kg/m   Opioid Risk Score:   Fall Risk Score:  `1  Depression screen PHQ 2/9     11/03/2022    1:04 PM 10/09/2022   11:20 AM 07/27/2022    2:42 PM 06/05/2022    2:15 PM 05/05/2022    2:30 PM 04/06/2022    1:10 PM 12/19/2021    2:07 PM  Depression screen PHQ 2/9  Decreased Interest 0 0 0 0 0 0 0  Down, Depressed, Hopeless 0 0 0 0 0 0 0  PHQ - 2 Score 0 0 0 0 0 0 0  Altered sleeping  0       PHQ-9 Score  0          Review of Systems  Musculoskeletal:  Positive for back pain.       Bilateral shoulder, hip and knee pain  All other systems reviewed and are negative.     Objective:   Physical Exam Vitals  and nursing note reviewed.  Constitutional:      Appearance: Normal appearance.  Cardiovascular:     Rate and  Rhythm: Normal rate and regular rhythm.     Pulses: Normal pulses.     Heart sounds: Normal heart sounds.  Pulmonary:     Effort: Pulmonary effort is normal.     Breath sounds: Normal breath sounds.  Musculoskeletal:     Cervical back: Normal range of motion and neck supple.     Comments: Normal Muscle Bulk and Muscle Testing Reveals:  Upper Extremities: Full ROM and Muscle Strength 5/5  Thoracic Paraspinal Tenderness: T-7-T-9 Lumbar Paraspinal Tenderness: L-4-L-5 Lower Extremities: Decreased ROM and Muscle Strength 5/5 Bilateral Lower Extremities Flexion Produces Pain into his Bilateral Patella's  Arises From Table with ease Narrow Based Gait     Skin:    General: Skin is warm and dry.  Neurological:     Mental Status: He is alert and oriented to person, place, and time.  Psychiatric:        Mood and Affect: Mood normal.        Behavior: Behavior normal.         Assessment & Plan:  1. Bilateral Shoulder Pain: Continue HEP as Tolerated. Continue current medication regimen. Continue to monitor. 12/21/2022 2.Left Greater Trochanter Bursitis: No complaints today. S/P Left Hip Injection with Dr Carlis Abbott on 09/03/2020, no relief noted he reports, Continue to alternate with Heat an Ice Therapy. Continue to monitor. 12/21/2022 3. Bilateral  Knees OA: Continue HEP as Tolerated. Continue current medication regimen. 12/21/2022 3. Chest Wall Discomfort/ Neuropathic Pain: No complaints today. Continue Gabapentin. Continue to Monitor. 12/21/2022 4.Chronic Low Back Pain without sciatica: Continue HEP and Continue current medication regimen. Continue to monitor. 12/21/2022 5. Chronic Pain Syndrome: : Refilled: Oxycodone 15 mg one tablet every 6 hours  as needed for pain #120. We will continue the opioid monitoring program, this consists of regular clinic visits, examinations, urine  drug screen, pill counts as well as use of [place] Controlled Substance Reporting system. A 12 month History has been reviewed on the [place] Controlled Substance Reporting System on 12/21/2022.   F/U in 1 month

## 2023-01-21 ENCOUNTER — Encounter: Payer: Self-pay | Admitting: Registered Nurse

## 2023-01-21 ENCOUNTER — Encounter: Payer: Medicaid Other | Attending: Physical Medicine and Rehabilitation | Admitting: Registered Nurse

## 2023-01-21 VITALS — BP 128/84 | HR 84 | Ht 68.0 in | Wt 183.0 lb

## 2023-01-21 DIAGNOSIS — G8929 Other chronic pain: Secondary | ICD-10-CM | POA: Insufficient documentation

## 2023-01-21 DIAGNOSIS — Z5181 Encounter for therapeutic drug level monitoring: Secondary | ICD-10-CM | POA: Insufficient documentation

## 2023-01-21 DIAGNOSIS — G894 Chronic pain syndrome: Secondary | ICD-10-CM | POA: Insufficient documentation

## 2023-01-21 DIAGNOSIS — M25552 Pain in left hip: Secondary | ICD-10-CM | POA: Insufficient documentation

## 2023-01-21 DIAGNOSIS — M170 Bilateral primary osteoarthritis of knee: Secondary | ICD-10-CM | POA: Insufficient documentation

## 2023-01-21 DIAGNOSIS — Z79899 Other long term (current) drug therapy: Secondary | ICD-10-CM | POA: Diagnosis present

## 2023-01-21 DIAGNOSIS — M25512 Pain in left shoulder: Secondary | ICD-10-CM | POA: Insufficient documentation

## 2023-01-21 DIAGNOSIS — M545 Low back pain, unspecified: Secondary | ICD-10-CM | POA: Insufficient documentation

## 2023-01-21 DIAGNOSIS — M25511 Pain in right shoulder: Secondary | ICD-10-CM | POA: Insufficient documentation

## 2023-01-21 MED ORDER — OXYCODONE HCL 15 MG PO TABS: 15.0000 mg | ORAL_TABLET | Freq: Four times a day (QID) | ORAL | 0 refills | Status: DC | PRN

## 2023-01-21 NOTE — Progress Notes (Signed)
Subjective:    Patient ID: William Crawford, male    DOB: [birth year], 52 y.o.   MRN: [id number]  HPI: William Crawford is a 52 y.o. male who returns for follow up appointment for chronic pain and medication refill. He states his pain is located in his bilateral shoulders L>R, lower back, left hip and bilateral knee pain. He rates his pain 7. His current exercise regime is walking and performing stretching exercises.  Mr. Staple Morphine equivalent is 90/00 MME.   UDS ordered today.      Pain Inventory Average Pain 7 Pain Right Now 7 My pain is intermittent, constant, sharp, burning, stabbing, tingling, and aching  In the last 24 hours, has pain interfered with the following? General activity 8 Relation with others 6 Enjoyment of life 6 What TIME of day is your pain at its worst? morning , evening, and night Sleep (in general) Poor  Pain is worse with: walking, bending, inactivity, and some activites Pain improves with: rest, heat/ice, and medication Relief from Meds: 8  Family History  Problem Relation Age of Onset   Heart attack Father    Heart disease Father    Heart attack Paternal Grandfather    Heart disease Paternal Grandfather    Heart attack Paternal Uncle    Heart disease Paternal Uncle    Heart attack Paternal Uncle    Heart disease Paternal Uncle    Social History   Socioeconomic History   Marital status: Single    Spouse name: Not on file   Number of children: Not on file   Years of education: Not on file   Highest education level: Not on file  Occupational History   Not on file  Tobacco Use   Smoking status: Former    Current packs/day: 0.00    Types: Cigarettes    Quit date: 10/23/2019    Years since quitting: 3.2   Smokeless tobacco: Never  Vaping Use   Vaping status: Never Used  Substance and Sexual Activity   Alcohol use: Not Currently    Comment: QUIT  IN MAY 40981   Drug use: No   Sexual activity: Not on file  Other Topics Concern    Not on file  Social History Narrative   Not on file   Social Determinants of Health   Financial Resource Strain: Not on file  Food Insecurity: Not on file  Transportation Needs: Not on file  Physical Activity: Not on file  Stress: Not on file  Social Connections: Not on file   Past Surgical History:  Procedure Laterality Date   APPLICATION OF WOUND VAC  10/25/2019   Procedure: Application Of Wound Vac;  Surgeon: Kerin Perna, MD;  Location: [facility] OR;  Service: Open Heart Surgery;;   CANNULATION FOR ECMO (EXTRACORPOREAL MEMBRANE OXYGENATION)  10/25/2019   Procedure: Cannulation For Ecmo (Extracorporeal Membrane Oxygenation) @ 1531;  Surgeon: Donata Clay, Theron Arista, MD;  Location: [facility] OR;  Service: Open Heart Surgery;;   CANNULATION FOR ECMO (EXTRACORPOREAL MEMBRANE OXYGENATION) N/A 10/30/2019   Procedure: DECANNULATION OF ECMO (EXTRACORPOREAL MEMBRANE OXYGENATION);  Surgeon: Donata Clay, Theron Arista, MD;  Location: [facility] OR;  Service: Open Heart Surgery;  Laterality: N/A;  Pump standby   CHEST EXPLORATION  10/25/2019   Procedure: Chest Exploration for repair of coronary perforation (repair of LAD);  Surgeon: Donata Clay, Theron Arista, MD;  Location: [facility] OR;  Service: Open Heart Surgery;;   CORONARY CTO INTERVENTION N/A 10/25/2019   Procedure: CORONARY CTO INTERVENTION;  Surgeon:  Swaziland, Peter M, MD;  Location: [facility] INVASIVE CV LAB;  Service: Cardiovascular;  Laterality: N/A;   CORONARY STENT INTERVENTION N/A 10/17/2019   Procedure: CORONARY STENT INTERVENTION;  Surgeon: Tonny Bollman, MD;  Location: [facility] INVASIVE CV LAB;  Service: Cardiovascular;  Laterality: N/A;   LEFT HEART CATH AND CORONARY ANGIOGRAPHY N/A 10/17/2019   Procedure: LEFT HEART CATH AND CORONARY ANGIOGRAPHY;  Surgeon: Tonny Bollman, MD;  Location: [facility] INVASIVE CV LAB;  Service: Cardiovascular;  Laterality: N/A;   MEDIASTERNOTOMY  10/25/2019   Procedure: Median Sternotomy.;  Surgeon: Kerin Perna, MD;  Location: [facility] OR;  Service: Open Heart Surgery;;    PLACEMENT OF IMPELLA LEFT VENTRICULAR ASSIST DEVICE  10/30/2019   Procedure: Placement Of Impella Left Ventricular Assist Device 5.5 USING HEMASHIELD PLATINUM GRAFT;  Surgeon: Kerin Perna, MD;  Location: [facility] OR;  Service: Open Heart Surgery;;  graft placed into aorta   REMOVAL OF IMPELLA LEFT VENTRICULAR ASSIST DEVICE N/A 11/03/2019   Procedure: REMOVAL OF IMPELLA 5.5 LEFT VENTRICULAR ASSIST DEVICE;  Surgeon: Kerin Perna, MD;  Location: [facility] OR;  Service: Open Heart Surgery;  Laterality: N/A;   STERNAL CLOSURE N/A 10/30/2019   Procedure: Sternal Closure;  Surgeon: Kerin Perna, MD;  Location: [facility] OR;  Service: Open Heart Surgery;  Laterality: N/A;   STERNAL WIRES REMOVAL N/A 05/14/2020   Procedure: STERNAL WIRES REMOVAL;  Surgeon: Kerin Perna, MD;  Location: [facility] OR;  Service: Thoracic;  Laterality: N/A;   TEE WITHOUT CARDIOVERSION N/A 10/25/2019   Procedure: TRANSESOPHAGEAL ECHOCARDIOGRAM (TEE);  Surgeon: Donata Clay, Theron Arista, MD;  Location: [facility] OR;  Service: Open Heart Surgery;  Laterality: N/A;   TEE WITHOUT CARDIOVERSION N/A 10/30/2019   Procedure: TRANSESOPHAGEAL ECHOCARDIOGRAM (TEE);  Surgeon: Donata Clay, Theron Arista, MD;  Location: [facility] OR;  Service: Open Heart Surgery;  Laterality: N/A;   TEE WITHOUT CARDIOVERSION N/A 11/03/2019   Procedure: TRANSESOPHAGEAL ECHOCARDIOGRAM (TEE);  Surgeon: Donata Clay, Theron Arista, MD;  Location: [facility] OR;  Service: Open Heart Surgery;  Laterality: N/A;   Past Surgical History:  Procedure Laterality Date   APPLICATION OF WOUND VAC  10/25/2019   Procedure: Application Of Wound Vac;  Surgeon: Kerin Perna, MD;  Location: [facility] OR;  Service: Open Heart Surgery;;   CANNULATION FOR ECMO (EXTRACORPOREAL MEMBRANE OXYGENATION)  10/25/2019   Procedure: Cannulation For Ecmo (Extracorporeal Membrane Oxygenation) @ 1531;  Surgeon: Kerin Perna, MD;  Location: [facility] OR;  Service: Open Heart Surgery;;   CANNULATION FOR ECMO (EXTRACORPOREAL MEMBRANE OXYGENATION) N/A 10/30/2019   Procedure:  DECANNULATION OF ECMO (EXTRACORPOREAL MEMBRANE OXYGENATION);  Surgeon: Donata Clay, Theron Arista, MD;  Location: [facility] OR;  Service: Open Heart Surgery;  Laterality: N/A;  Pump standby   CHEST EXPLORATION  10/25/2019   Procedure: Chest Exploration for repair of coronary perforation (repair of LAD);  Surgeon: Donata Clay, Theron Arista, MD;  Location: [facility] OR;  Service: Open Heart Surgery;;   CORONARY CTO INTERVENTION N/A 10/25/2019   Procedure: CORONARY CTO INTERVENTION;  Surgeon: Swaziland, Peter M, MD;  Location: [facility] INVASIVE CV LAB;  Service: Cardiovascular;  Laterality: N/A;   CORONARY STENT INTERVENTION N/A 10/17/2019   Procedure: CORONARY STENT INTERVENTION;  Surgeon: Tonny Bollman, MD;  Location: [facility] INVASIVE CV LAB;  Service: Cardiovascular;  Laterality: N/A;   LEFT HEART CATH AND CORONARY ANGIOGRAPHY N/A 10/17/2019   Procedure: LEFT HEART CATH AND CORONARY ANGIOGRAPHY;  Surgeon: Tonny Bollman, MD;  Location: [facility] INVASIVE CV LAB;  Service: Cardiovascular;  Laterality: N/A;   MEDIASTERNOTOMY  10/25/2019  Procedure: Median Sternotomy.;  Surgeon: Kerin Perna, MD;  Location: [facility] OR;  Service: Open Heart Surgery;;   PLACEMENT OF IMPELLA LEFT VENTRICULAR ASSIST DEVICE  10/30/2019   Procedure: Placement Of Impella Left Ventricular Assist Device 5.5 USING HEMASHIELD PLATINUM GRAFT;  Surgeon: Kerin Perna, MD;  Location: [facility] OR;  Service: Open Heart Surgery;;  graft placed into aorta   REMOVAL OF IMPELLA LEFT VENTRICULAR ASSIST DEVICE N/A 11/03/2019   Procedure: REMOVAL OF IMPELLA 5.5 LEFT VENTRICULAR ASSIST DEVICE;  Surgeon: Kerin Perna, MD;  Location: [facility] OR;  Service: Open Heart Surgery;  Laterality: N/A;   STERNAL CLOSURE N/A 10/30/2019   Procedure: Sternal Closure;  Surgeon: Kerin Perna, MD;  Location: [facility] OR;  Service: Open Heart Surgery;  Laterality: N/A;   STERNAL WIRES REMOVAL N/A 05/14/2020   Procedure: STERNAL WIRES REMOVAL;  Surgeon: Kerin Perna, MD;  Location: [facility] OR;  Service: Thoracic;  Laterality:  N/A;   TEE WITHOUT CARDIOVERSION N/A 10/25/2019   Procedure: TRANSESOPHAGEAL ECHOCARDIOGRAM (TEE);  Surgeon: Donata Clay, Theron Arista, MD;  Location: [facility] OR;  Service: Open Heart Surgery;  Laterality: N/A;   TEE WITHOUT CARDIOVERSION N/A 10/30/2019   Procedure: TRANSESOPHAGEAL ECHOCARDIOGRAM (TEE);  Surgeon: Donata Clay, Theron Arista, MD;  Location: [facility] OR;  Service: Open Heart Surgery;  Laterality: N/A;   TEE WITHOUT CARDIOVERSION N/A 11/03/2019   Procedure: TRANSESOPHAGEAL ECHOCARDIOGRAM (TEE);  Surgeon: Donata Clay, Theron Arista, MD;  Location: [facility] OR;  Service: Open Heart Surgery;  Laterality: N/A;   Past Medical History:  Diagnosis Date   Alcohol abuse    Anxiety    CAD (coronary artery disease)    Cardiac arrest (HCC)    Cardiogenic shock (HCC)    CHF (congestive heart failure) (HCC)    Cough    Dysrhythmia    GERD (gastroesophageal reflux disease)    HFrEF (heart failure with reduced ejection fraction) (HCC)    Hypertension    Hypertriglyceridemia    Ischemic cardiomyopathy    Right atrial thrombus    Tobacco abuse    There were no vitals taken for this visit.  Opioid Risk Score:   Fall Risk Score:  `1  Depression screen PHQ 2/9     11/03/2022    1:04 PM 10/09/2022   11:20 AM 07/27/2022    2:42 PM 06/05/2022    2:15 PM 05/05/2022    2:30 PM 04/06/2022    1:10 PM 12/19/2021    2:07 PM  Depression screen PHQ 2/9  Decreased Interest 0 0 0 0 0 0 0  Down, Depressed, Hopeless 0 0 0 0 0 0 0  PHQ - 2 Score 0 0 0 0 0 0 0  Altered sleeping  0       PHQ-9 Score  0         Review of Systems  Musculoskeletal:  Positive for back pain.       Pain in both knees, left hip, left & right shoulder, chest area  All other systems reviewed and are negative.     Objective:   Physical Exam Vitals and nursing note reviewed.  Constitutional:      Appearance: Normal appearance.  Cardiovascular:     Rate and Rhythm: Normal rate and regular rhythm.     Pulses: Normal pulses.     Heart sounds: Normal heart sounds.   Pulmonary:     Effort: Pulmonary effort is normal.     Breath sounds: Normal breath sounds.  Musculoskeletal:  Cervical back: Normal range of motion and neck supple.     Comments: Normal Muscle Bulk and Muscle Testing Reveals:  Upper Extremities: Full ROM and Muscle Strength 5/5 Lumbar Paraspinal Tenderness: L-4-L-5 Lower Extremities: Right: Full ROM and Muscle Strength 5/5 Left Lower Extremity: Decreased ROM and Muscle Strength 5/5 Left Lower Extremity Flexion Produces Pain into his Left Hip and  Left Patella Arises from chair with ease Narrow based  Gait     Skin:    General: Skin is warm and dry.  Neurological:     Mental Status: He is alert and oriented to person, place, and time.  Psychiatric:        Mood and Affect: Mood normal.        Behavior: Behavior normal.         Assessment & Plan:  1. Bilateral Shoulder Pain: Continue HEP as Tolerated. Continue current medication regimen. Continue to monitor. 01/21/2023 2.Left Greater Trochanter Bursitis: No complaints today. S/P Left Hip Injection with Dr Carlis Abbott on 09/03/2020, no relief noted he reports, Continue to alternate with Heat an Ice Therapy. Continue to monitor. 01/21/2023 3. Bilateral  Knees OA: Continue HEP as Tolerated. Continue current medication regimen. 01/21/2023 3. Chest Wall Discomfort/ Neuropathic Pain: No complaints today. Continue Gabapentin. Continue to Monitor. 01/21/2023 4.Chronic Low Back Pain without sciatica: Continue HEP and Continue current medication regimen. Continue to monitor. 01/21/2023 5. Chronic Pain Syndrome: : Refilled: Oxycodone 15 mg one tablet every 6 hours  as needed for pain #120. We will continue the opioid monitoring program, this consists of regular clinic visits, examinations, urine drug screen, pill counts as well as use of [place] Controlled Substance Reporting system. A 12 month History has been reviewed on the [place] Controlled Substance Reporting System on  01/21/2023. 6. Chronic Left Hip Pain: Denies Falling: Refuses X-ray at this time. Continue to Monitor.   F/U in 1 month

## 2023-01-26 ENCOUNTER — Other Ambulatory Visit: Payer: Self-pay | Admitting: Physical Medicine and Rehabilitation

## 2023-01-28 LAB — TOXASSURE SELECT,+ANTIDEPR,UR

## 2023-02-19 ENCOUNTER — Telehealth (HOSPITAL_COMMUNITY): Payer: Self-pay | Admitting: Cardiology

## 2023-02-19 NOTE — Telephone Encounter (Signed)
Pt left VM on pharmacy line with concerns of chest pains  Returned call Pt reports CP all day 09/26 off and on Last longer than 10 -15 secs closer to 30-45 secs. Reports pains as sharp radiating into arms. Did not take NTG Currently resting     Advised pt should report to ER for active chest pains

## 2023-02-23 ENCOUNTER — Encounter: Payer: Medicaid Other | Attending: Physical Medicine and Rehabilitation | Admitting: Registered Nurse

## 2023-02-23 ENCOUNTER — Encounter: Payer: Self-pay | Admitting: Registered Nurse

## 2023-02-23 VITALS — BP 122/74 | HR 76 | Ht 68.0 in | Wt 183.0 lb

## 2023-02-23 DIAGNOSIS — M170 Bilateral primary osteoarthritis of knee: Secondary | ICD-10-CM | POA: Insufficient documentation

## 2023-02-23 DIAGNOSIS — Z5181 Encounter for therapeutic drug level monitoring: Secondary | ICD-10-CM | POA: Diagnosis present

## 2023-02-23 DIAGNOSIS — G894 Chronic pain syndrome: Secondary | ICD-10-CM | POA: Insufficient documentation

## 2023-02-23 DIAGNOSIS — M25512 Pain in left shoulder: Secondary | ICD-10-CM | POA: Insufficient documentation

## 2023-02-23 DIAGNOSIS — Z79899 Other long term (current) drug therapy: Secondary | ICD-10-CM | POA: Insufficient documentation

## 2023-02-23 DIAGNOSIS — M2550 Pain in unspecified joint: Secondary | ICD-10-CM | POA: Insufficient documentation

## 2023-02-23 DIAGNOSIS — M545 Low back pain, unspecified: Secondary | ICD-10-CM | POA: Insufficient documentation

## 2023-02-23 DIAGNOSIS — M25511 Pain in right shoulder: Secondary | ICD-10-CM | POA: Diagnosis present

## 2023-02-23 DIAGNOSIS — G8929 Other chronic pain: Secondary | ICD-10-CM | POA: Insufficient documentation

## 2023-02-23 MED ORDER — OXYCODONE HCL 15 MG PO TABS: 15.0000 mg | ORAL_TABLET | Freq: Four times a day (QID) | ORAL | 0 refills | Status: DC | PRN

## 2023-02-23 NOTE — Progress Notes (Signed)
Subjective:    Patient ID: William Crawford, male    DOB: [birth year], 52 y.o.   MRN: [id number]  HPI: William Crawford is a 52 y.o. male who returns for follow up appointment for chronic pain and medication refill. He states his pain is located in his bilateral shoulders, lower back pain, bilateral knee pain  and generalized joint pain. He rates his pain 7. His current exercise regime is walking and performing stretching exercises.  William Crawford reports last week he had chest discomfort, he called his cardiologist and was instructed to go to the ED. He states he didn't go to the ED, the pain subsided. He denies any chest pain at this time. He was instructed if he develops any chest pain, to go to the emergency room. He verbalizes understanding.   William Crawford Morphine equivalent is  90.00MME.   Last UDS was Performed on 01/21/2023, it was consistent.     Pain Inventory Average Pain 7 Pain Right Now 7 My pain is sharp, burning, stabbing, tingling, and aching  In the last 24 hours, has pain interfered with the following? General activity 8 Relation with others 8 Enjoyment of life 8 What TIME of day is your pain at its worst? morning , daytime, and evening Sleep (in general) Poor  Pain is worse with: walking, bending, sitting, standing, and some activites Pain improves with: rest, heat/ice, and medication Relief from Meds: 5  Family History  Problem Relation Age of Onset   Heart attack Father    Heart disease Father    Heart attack Paternal Grandfather    Heart disease Paternal Grandfather    Heart attack Paternal Uncle    Heart disease Paternal Uncle    Heart attack Paternal Uncle    Heart disease Paternal Uncle    Social History   Socioeconomic History   Marital status: Single    Spouse name: Not on file   Number of children: Not on file   Years of education: Not on file   Highest education level: Not on file  Occupational History   Not on file  Tobacco Use   Smoking  status: Former    Current packs/day: 0.00    Types: Cigarettes    Quit date: 10/23/2019    Years since quitting: 3.3   Smokeless tobacco: Never  Vaping Use   Vaping status: Never Used  Substance and Sexual Activity   Alcohol use: Not Currently    Comment: QUIT  IN MAY 40981   Drug use: No   Sexual activity: Not on file  Other Topics Concern   Not on file  Social History Narrative   Not on file   Social Determinants of Health   Financial Resource Strain: Not on file  Food Insecurity: Not on file  Transportation Needs: Not on file  Physical Activity: Not on file  Stress: Not on file  Social Connections: Not on file   Past Surgical History:  Procedure Laterality Date   APPLICATION OF WOUND VAC  10/25/2019   Procedure: Application Of Wound Vac;  Surgeon: Kerin Perna, MD;  Location: [facility] OR;  Service: Open Heart Surgery;;   CANNULATION FOR ECMO (EXTRACORPOREAL MEMBRANE OXYGENATION)  10/25/2019   Procedure: Cannulation For Ecmo (Extracorporeal Membrane Oxygenation) @ 1531;  Surgeon: Donata Clay, Theron Arista, MD;  Location: [facility] OR;  Service: Open Heart Surgery;;   CANNULATION FOR ECMO (EXTRACORPOREAL MEMBRANE OXYGENATION) N/A 10/30/2019   Procedure: DECANNULATION OF ECMO (EXTRACORPOREAL MEMBRANE OXYGENATION);  Surgeon: Donata Clay,  Theron Arista, MD;  Location: [facility] OR;  Service: Open Heart Surgery;  Laterality: N/A;  Pump standby   CHEST EXPLORATION  10/25/2019   Procedure: Chest Exploration for repair of coronary perforation (repair of LAD);  Surgeon: Donata Clay, Theron Arista, MD;  Location: [facility] OR;  Service: Open Heart Surgery;;   CORONARY CTO INTERVENTION N/A 10/25/2019   Procedure: CORONARY CTO INTERVENTION;  Surgeon: Swaziland, Peter M, MD;  Location: [facility] INVASIVE CV LAB;  Service: Cardiovascular;  Laterality: N/A;   CORONARY STENT INTERVENTION N/A 10/17/2019   Procedure: CORONARY STENT INTERVENTION;  Surgeon: Tonny Bollman, MD;  Location: [facility] INVASIVE CV LAB;  Service: Cardiovascular;  Laterality: N/A;   LEFT HEART  CATH AND CORONARY ANGIOGRAPHY N/A 10/17/2019   Procedure: LEFT HEART CATH AND CORONARY ANGIOGRAPHY;  Surgeon: Tonny Bollman, MD;  Location: [facility] INVASIVE CV LAB;  Service: Cardiovascular;  Laterality: N/A;   MEDIASTERNOTOMY  10/25/2019   Procedure: Median Sternotomy.;  Surgeon: Kerin Perna, MD;  Location: [facility] OR;  Service: Open Heart Surgery;;   PLACEMENT OF IMPELLA LEFT VENTRICULAR ASSIST DEVICE  10/30/2019   Procedure: Placement Of Impella Left Ventricular Assist Device 5.5 USING HEMASHIELD PLATINUM GRAFT;  Surgeon: Kerin Perna, MD;  Location: [facility] OR;  Service: Open Heart Surgery;;  graft placed into aorta   REMOVAL OF IMPELLA LEFT VENTRICULAR ASSIST DEVICE N/A 11/03/2019   Procedure: REMOVAL OF IMPELLA 5.5 LEFT VENTRICULAR ASSIST DEVICE;  Surgeon: Kerin Perna, MD;  Location: [facility] OR;  Service: Open Heart Surgery;  Laterality: N/A;   STERNAL CLOSURE N/A 10/30/2019   Procedure: Sternal Closure;  Surgeon: Kerin Perna, MD;  Location: [facility] OR;  Service: Open Heart Surgery;  Laterality: N/A;   STERNAL WIRES REMOVAL N/A 05/14/2020   Procedure: STERNAL WIRES REMOVAL;  Surgeon: Kerin Perna, MD;  Location: [facility] OR;  Service: Thoracic;  Laterality: N/A;   TEE WITHOUT CARDIOVERSION N/A 10/25/2019   Procedure: TRANSESOPHAGEAL ECHOCARDIOGRAM (TEE);  Surgeon: Donata Clay, Theron Arista, MD;  Location: [facility] OR;  Service: Open Heart Surgery;  Laterality: N/A;   TEE WITHOUT CARDIOVERSION N/A 10/30/2019   Procedure: TRANSESOPHAGEAL ECHOCARDIOGRAM (TEE);  Surgeon: Donata Clay, Theron Arista, MD;  Location: [facility] OR;  Service: Open Heart Surgery;  Laterality: N/A;   TEE WITHOUT CARDIOVERSION N/A 11/03/2019   Procedure: TRANSESOPHAGEAL ECHOCARDIOGRAM (TEE);  Surgeon: Donata Clay, Theron Arista, MD;  Location: [facility] OR;  Service: Open Heart Surgery;  Laterality: N/A;   Past Surgical History:  Procedure Laterality Date   APPLICATION OF WOUND VAC  10/25/2019   Procedure: Application Of Wound Vac;  Surgeon: Kerin Perna, MD;  Location: [facility] OR;   Service: Open Heart Surgery;;   CANNULATION FOR ECMO (EXTRACORPOREAL MEMBRANE OXYGENATION)  10/25/2019   Procedure: Cannulation For Ecmo (Extracorporeal Membrane Oxygenation) @ 1531;  Surgeon: Kerin Perna, MD;  Location: [facility] OR;  Service: Open Heart Surgery;;   CANNULATION FOR ECMO (EXTRACORPOREAL MEMBRANE OXYGENATION) N/A 10/30/2019   Procedure: DECANNULATION OF ECMO (EXTRACORPOREAL MEMBRANE OXYGENATION);  Surgeon: Donata Clay, Theron Arista, MD;  Location: [facility] OR;  Service: Open Heart Surgery;  Laterality: N/A;  Pump standby   CHEST EXPLORATION  10/25/2019   Procedure: Chest Exploration for repair of coronary perforation (repair of LAD);  Surgeon: Donata Clay, Theron Arista, MD;  Location: [facility] OR;  Service: Open Heart Surgery;;   CORONARY CTO INTERVENTION N/A 10/25/2019   Procedure: CORONARY CTO INTERVENTION;  Surgeon: Swaziland, Peter M, MD;  Location: [facility] INVASIVE CV LAB;  Service: Cardiovascular;  Laterality: N/A;   CORONARY STENT INTERVENTION N/A 10/17/2019  Procedure: CORONARY STENT INTERVENTION;  Surgeon: Tonny Bollman, MD;  Location: [facility] INVASIVE CV LAB;  Service: Cardiovascular;  Laterality: N/A;   LEFT HEART CATH AND CORONARY ANGIOGRAPHY N/A 10/17/2019   Procedure: LEFT HEART CATH AND CORONARY ANGIOGRAPHY;  Surgeon: Tonny Bollman, MD;  Location: [facility] INVASIVE CV LAB;  Service: Cardiovascular;  Laterality: N/A;   MEDIASTERNOTOMY  10/25/2019   Procedure: Median Sternotomy.;  Surgeon: Kerin Perna, MD;  Location: [facility] OR;  Service: Open Heart Surgery;;   PLACEMENT OF IMPELLA LEFT VENTRICULAR ASSIST DEVICE  10/30/2019   Procedure: Placement Of Impella Left Ventricular Assist Device 5.5 USING HEMASHIELD PLATINUM GRAFT;  Surgeon: Kerin Perna, MD;  Location: [facility] OR;  Service: Open Heart Surgery;;  graft placed into aorta   REMOVAL OF IMPELLA LEFT VENTRICULAR ASSIST DEVICE N/A 11/03/2019   Procedure: REMOVAL OF IMPELLA 5.5 LEFT VENTRICULAR ASSIST DEVICE;  Surgeon: Kerin Perna, MD;  Location: [facility] OR;  Service: Open  Heart Surgery;  Laterality: N/A;   STERNAL CLOSURE N/A 10/30/2019   Procedure: Sternal Closure;  Surgeon: Kerin Perna, MD;  Location: [facility] OR;  Service: Open Heart Surgery;  Laterality: N/A;   STERNAL WIRES REMOVAL N/A 05/14/2020   Procedure: STERNAL WIRES REMOVAL;  Surgeon: Kerin Perna, MD;  Location: [facility] OR;  Service: Thoracic;  Laterality: N/A;   TEE WITHOUT CARDIOVERSION N/A 10/25/2019   Procedure: TRANSESOPHAGEAL ECHOCARDIOGRAM (TEE);  Surgeon: Donata Clay, Theron Arista, MD;  Location: [facility] OR;  Service: Open Heart Surgery;  Laterality: N/A;   TEE WITHOUT CARDIOVERSION N/A 10/30/2019   Procedure: TRANSESOPHAGEAL ECHOCARDIOGRAM (TEE);  Surgeon: Donata Clay, Theron Arista, MD;  Location: [facility] OR;  Service: Open Heart Surgery;  Laterality: N/A;   TEE WITHOUT CARDIOVERSION N/A 11/03/2019   Procedure: TRANSESOPHAGEAL ECHOCARDIOGRAM (TEE);  Surgeon: Donata Clay, Theron Arista, MD;  Location: [facility] OR;  Service: Open Heart Surgery;  Laterality: N/A;   Past Medical History:  Diagnosis Date   Alcohol abuse    Anxiety    CAD (coronary artery disease)    Cardiac arrest (HCC)    Cardiogenic shock (HCC)    CHF (congestive heart failure) (HCC)    Cough    Dysrhythmia    GERD (gastroesophageal reflux disease)    HFrEF (heart failure with reduced ejection fraction) (HCC)    Hypertension    Hypertriglyceridemia    Ischemic cardiomyopathy    Right atrial thrombus    Tobacco abuse    BP 122/74   Pulse 76   Ht 5\' 8"  (1.727 m)   Wt 183 lb (83 kg)   SpO2 98%   BMI 27.83 kg/m   Opioid Risk Score:   Fall Risk Score:  `1  Depression screen PHQ 2/9     01/21/2023    1:10 PM 11/03/2022    1:04 PM 10/09/2022   11:20 AM 07/27/2022    2:42 PM 06/05/2022    2:15 PM 05/05/2022    2:30 PM 04/06/2022    1:10 PM  Depression screen PHQ 2/9  Decreased Interest 0 0 0 0 0 0 0  Down, Depressed, Hopeless 0 0 0 0 0 0 0  PHQ - 2 Score 0 0 0 0 0 0 0  Altered sleeping   0      PHQ-9 Score   0         Review of Systems  Musculoskeletal:   Positive for back pain.       Bilateral shoulder and knee pain  All other systems reviewed and are negative.  Objective:   Physical Exam Vitals and nursing note reviewed.  Constitutional:      Appearance: Normal appearance.  Cardiovascular:     Rate and Rhythm: Normal rate and regular rhythm.     Pulses: Normal pulses.     Heart sounds: Normal heart sounds.  Pulmonary:     Effort: Pulmonary effort is normal.     Breath sounds: Normal breath sounds.  Musculoskeletal:     Cervical back: Normal range of motion and neck supple.     Comments: Normal Muscle Bulk and Muscle Testing Reveals:  Upper Extremities: Full ROM and Muscle Strength 5/5 Lumbar Paraspinal Tenderness: L-3-L-5 Lower Extremities: Full ROM and Muscle Strength 5/5 Arises from Chair with ease Narrow Based  Gait     Skin:    General: Skin is warm and dry.  Neurological:     Mental Status: He is alert and oriented to person, place, and time.  Psychiatric:        Mood and Affect: Mood normal.        Behavior: Behavior normal.         Assessment & Plan:  1. Bilateral Shoulder Pain: Continue HEP as Tolerated. Continue current medication regimen. Continue to monitor. 02/23/2023 2.Left Greater Trochanter Bursitis: No complaints today. S/P Left Hip Injection with Dr Carlis Abbott on 09/03/2020, no relief noted he reports, Continue to alternate with Heat an Ice Therapy. Continue to monitor. 02/23/2023 3. Bilateral  Knees OA: Continue HEP as Tolerated. Continue current medication regimen. 02/23/2023 3. Chest Wall Discomfort/ Neuropathic Pain: No complaints today. Continue Gabapentin. Continue to Monitor. 02/23/2023 4.Chronic Low Back Pain without sciatica: Continue HEP and Continue current medication regimen. Continue to monitor. 02/23/2023 5. Chronic Pain Syndrome: : Refilled: Oxycodone 15 mg one tablet every 6 hours  as needed for pain #120. We will continue the opioid monitoring program, this consists of regular clinic  visits, examinations, urine drug screen, pill counts as well as use of [place] Controlled Substance Reporting system. A 12 month History has been reviewed on the [place] Controlled Substance Reporting System on 02/23/2023.    F/U in 1 month

## 2023-03-01 ENCOUNTER — Telehealth: Payer: Self-pay

## 2023-03-01 MED ORDER — OXYCODONE HCL 15 MG PO TABS: 15.0000 mg | ORAL_TABLET | Freq: Four times a day (QID) | ORAL | 0 refills | Status: DC | PRN

## 2023-03-01 NOTE — Telephone Encounter (Signed)
Approved on October 4 by [facility] 2017 NCPDP Request Reference Number: [id number]. OXYCODONE TAB 15MG  is approved through 08/27/2023.   Patient pickup a 5 day supply on 02/26/23. PA submitted and approved. Please send remainder of prescription.

## 2023-03-03 ENCOUNTER — Other Ambulatory Visit: Payer: Self-pay | Admitting: Registered Nurse

## 2023-03-03 MED ORDER — OXYCODONE HCL 15 MG PO TABS: 15.0000 mg | ORAL_TABLET | Freq: Four times a day (QID) | ORAL | 0 refills | Status: DC | PRN

## 2023-03-23 NOTE — Progress Notes (Signed)
PCP: Barbie Banner, MD  [facility] Cardiology: Dr. Shirlee Latch  52 y.o. with history of heavy smoking, heavy ETOH, and strong FH of CAD was initially admitted to [facility] on 10/17/19 with chest pain and NSTEMI.  LHC showed 95% distal RCA (culprit) with chronically occluded proximal LAD and OM1 with collaterals.  He had DES to distal RCA initially.  Echo showed EF 35-40% with inferior wall motion abnormality and normal RV.  He was discharged home with plan for CTO intervention on LAD.  He came for elective CTO procedure in 6/21, however he had perforation of the LAD with balloon inflation. A covered stent was placed.  Echo showed EF 45% with apical hypokinesis, moderate pericardial effusion and evidence for tamponade.  He was then taken to the OR for LAD repair and drainage of pericardial effusion.    In the OR, he had a VF arrest with about 15 minutes of CPR.  He was put on cardiopulmonary bypass and the tear in the LAD was repaired.  The LAD was not graftable.  TEE in the OR showed severe biventricular failure, suspect stunning post-VF arrest.  He was unable to come off bypass.  Therefore, he was cannulated for ECMO with venous drainage from RA and arterial return to ascending aorta.  After 4 days, ECMO was decannulated and Impella 5.5 was placed.  4 days after that, Impella was removed. He was eventually weaned off pressors/inotropes and discharged home.  Echo in 11/03/19 showed EF up to 45-50%.    Echo was done in 9/21 with EF 55% with mild anteroseptal hypokinesis and normal RV.   Patient was admitted with presyncope in 10/21.  Workup was unremarkable, thought to be possible vasovagal event.  As HR was around 50 at times, Coreg was stopped. Sherryll Burger was also decreased. Echo in 10/21 showed EF 50-55% with normal RV. Zio patch showed no significant arrhythmias.   Echo in 10/21 showed EF 50% with mid inferolateral severe hypokinesis, normal RV, mild MR. Echo in 9/22 was stable with LV EF 50%, normal RV.   Patient  returns for followup of CAD and CHF.  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  It has gradually improved over time.   He is on gabapentin and followed in a pain clinic for chronic chest, low back, shoulder, and knee pain.  No exertional chest pain.  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  No significant exertional dyspnea unless he heavily exerts himself.  No orthopnea/PND.  No palpitations.   Labs (7/21): K 4.1, creatinine 1.08 Labs (8/21): K 3.9, creatinine 1.04, LDL 67, HDL 37, TGs 248 Labs (12/21): K 3.7, creatinine 1.2 Labs (3/22): LDL 57, HDl 36, TGs 80 Labs (7/23): LDL 71  ECG (personally reviewed): NSR, RBBB  PMH: 1. ETOH abuse 2. Smoking 3. GERD 4. CAD: NSTEMI in 5/21 with 95% distal RCA (culprit) and CTO LAD and OM1.  He had DES to distal RCA.  - He returned in 6/21 for CTO procedure to LAD, complicated by perforation of the LAD.  He was taken to the OR emergently for repair of the perforation.  Unable to graft the LAD.  5. VF arrest: 6/21 at time of LAD perforation/tamponade. 6. Ischemic cardiomyopathy:  - Echo (5/21): EF 35-40%. - Echo (6/21, post arrest): EF < 20%, moderately reduced RV function.  - ECMO in 6/21 post-LAD perforation and arrest.  - Echo (11/03/19): EF 45-50%.  - Echo (9/21): EF 55%, mild  anteroseptal hypokinesis, normal RV.  - Echo (10/21): EF 50-55% with normal RV. - Echo (9/22): LV EF 50%, normal RV.  7. Right atrial thrombus: Noted post-ECMO.  Warfarin.  8. Presyncopal episode in 10/21: Zio patch (10/21) with no significant arrhythmias noted.  9. Neuropathic chest wall pain post-sternotomy.   Social History   Socioeconomic History   Marital status: Single    Spouse name: Not on file   Number of children: Not on file   Years of education: Not on file   Highest education level: Not on file  Occupational History   Not on file  Tobacco Use   Smoking status: Former    Current packs/day: 0.00    Types:  Cigarettes    Quit date: 10/23/2019    Years since quitting: 3.4   Smokeless tobacco: Never  Vaping Use   Vaping status: Never Used  Substance and Sexual Activity   Alcohol use: Not Currently    Comment: QUIT  IN MAY 40981   Drug use: No   Sexual activity: Not on file  Other Topics Concern   Not on file  Social History Narrative   Not on file   Social Determinants of Health   Financial Resource Strain: Not on file  Food Insecurity: Not on file  Transportation Needs: Not on file  Physical Activity: Not on file  Stress: Not on file  Social Connections: Not on file  Intimate Partner Violence: Not on file   Family History  Problem Relation Age of Onset   Heart attack Father    Heart disease Father    Heart attack Paternal Grandfather    Heart disease Paternal Grandfather    Heart attack Paternal Uncle    Heart disease Paternal Uncle    Heart attack Paternal Uncle    Heart disease Paternal Uncle    ROS: All systems reviewed and negative except as per HPI.   Current Outpatient Medications  Medication Sig Dispense Refill   amitriptyline (ELAVIL) 25 MG tablet Take 1 tablet (25 mg total) by mouth at bedtime. 90 tablet 3   Ascorbic Acid (VITAMIN C) 100 MG tablet Take 100 mg by mouth daily.     atorvastatin (LIPITOR) 40 MG tablet Take 1 tablet (40 mg total) by mouth daily. 90 tablet 3   carvedilol (COREG) 6.25 MG tablet TAKE 1 TABLET BY MOUTH TWICE A DAY 180 tablet 1   clopidogrel (PLAVIX) 75 MG tablet TAKE 1 TABLET EVERY DAY 90 tablet 3   cyclobenzaprine (FLEXERIL) 10 MG tablet TAKE 1 TABLET BY MOUTH THREE TIMES A DAY AS NEEDED FOR MUSCLE SPASMS 90 tablet 0   fenofibrate (TRICOR) 48 MG tablet TAKE 1 TABLET EVERY DAY 90 tablet 2   fluticasone (FLONASE) 50 MCG/ACT nasal spray Place 2 sprays into both nostrils daily.     gabapentin (NEURONTIN) 800 MG tablet Take 1 tablet (800 mg total) by mouth 3 (three) times daily. 90 tablet 3   Misc Natural Products (OSTEO BI-FLEX ADV JOINT  SHIELD) TABS Take 2 tablets by mouth.     nitroGLYCERIN (NITROSTAT) 0.4 MG SL tablet Place 1 tablet (0.4 mg total) under the tongue every 5 (five) minutes as needed for chest pain. 30 tablet 12   oxyCODONE (ROXICODONE) 15 MG immediate release tablet Take 1 tablet (15 mg total) by mouth every 6 (six) hours as needed for pain. 120 tablet 0   pantoprazole (PROTONIX) 40 MG tablet Take 1 tablet by mouth daily.     sacubitril-valsartan (ENTRESTO) 49-51  MG TAKE 1 TABLET BY MOUTH TWICE A DAY 60 tablet 11   thiamine 100 MG tablet Take 1 tablet by mouth daily.     Vitamin D, Ergocalciferol, (DRISDOL) 1.25 MG (50000 UNIT) CAPS capsule TAKE 1 CAPSULE (50,000 UNITS TOTAL) BY MOUTH EVERY 7 (SEVEN) DAYS 12 capsule 1   Vitamins C E (VITAMIN C & E COMPLEX PO) Take by mouth daily at 12 noon.     No current facility-administered medications for this visit.   There were no vitals taken for this visit. General: NAD Neck: No JVD, no thyromegaly or thyroid nodule.  Lungs: Clear to auscultation bilaterally with normal respiratory effort. CV: Nondisplaced PMI.  Heart regular S1/S2, no S3/S4, no murmur.  No peripheral edema.  No carotid bruit.  Normal pedal pulses.  Abdomen: Soft, nontender, no hepatosplenomegaly, no distention.  Skin: Intact without lesions or rashes.  Neurologic: Alert and oriented x 3.  Psych: Normal affect. Extremities: No clubbing or cyanosis.  HEENT: Normal.   Assessment/Plan: 1. CAD: NSTEMI in 5/21 with DES to distal RCA.  He was left with occluded OM1 and LAD. CTO procedure was attempted to open LAD, but complicated by LAD perforation and tamponade/cardiac arrest requiring trip to OR and [facility] ECMO. The LAD was not graftable, he has no perfusion to the LAD territory except by collaterals.  No exertional chest pain.  - Continue clopidogrel 75 mg daily.  - Continue atorvastatin, dose was decreased due to joint pain but this did not help.  Check lipids today, if LDL not < 55, will increase  atorvastatin.  2. Cardiac arrest: In OR after LAD perforation/tamponade.  - With recovery of EF, he is off amiodarone.  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   - Off warfarin with recovery of EF and no thrombus on last echo.  4. Smoking: He remains abstinent.  5. ETOH abuse: rare use.  6. Chronic systolic CHF: Ischemic cardiomyopathy.  Echo after cardiac arrest in 6/21 showed EF < 20% with moderate RV dysfunction.  Echo in 6/21 after decannulation of ECMO and removal of Impella 5.5 showed EF up to 45-50%.  Echo in 10/21 with EF 50-55%.  Echo in 9/22 with EF 50%. On exam, he is not volume overloaded.  NYHA class I, working full time as a Education administrator.   - Continue Coreg 6.25 mg bid.  - Continue Entresto 49/51 bid. BMET/BNP today.  - Off spironolactone, he had painful gynecomastia that is likely due to spironolactone.  - He is not on a loop diuretic and does not appear to need one.  - I will arrange for an echo.  7. Hypertriglyceridemia: Continue Vascepa.  8. HTN: BP controlled.   Followup 6 months with APP  Anderson Malta [facility] 03/23/2023

## 2023-03-24 ENCOUNTER — Ambulatory Visit (HOSPITAL_COMMUNITY)
Admission: RE | Admit: 2023-03-24 | Discharge: 2023-03-24 | Disposition: A | Discharge status: Home / Self Care | Payer: Medicaid Other | Source: Ambulatory Visit | Attending: Family Medicine

## 2023-03-24 ENCOUNTER — Encounter (HOSPITAL_COMMUNITY): Payer: Self-pay

## 2023-03-24 VITALS — BP 132/82 | HR 84 | Wt 176.6 lb

## 2023-03-24 DIAGNOSIS — I513 Intracardiac thrombosis, not elsewhere classified: Secondary | ICD-10-CM

## 2023-03-24 DIAGNOSIS — I2510 Atherosclerotic heart disease of native coronary artery without angina pectoris: Secondary | ICD-10-CM | POA: Diagnosis not present

## 2023-03-24 DIAGNOSIS — I5022 Chronic systolic (congestive) heart failure: Secondary | ICD-10-CM | POA: Diagnosis not present

## 2023-03-24 DIAGNOSIS — Z7902 Long term (current) use of antithrombotics/antiplatelets: Secondary | ICD-10-CM | POA: Insufficient documentation

## 2023-03-24 DIAGNOSIS — I110 Hypertensive heart disease with heart failure: Secondary | ICD-10-CM | POA: Diagnosis not present

## 2023-03-24 DIAGNOSIS — E781 Pure hyperglyceridemia: Secondary | ICD-10-CM | POA: Insufficient documentation

## 2023-03-24 DIAGNOSIS — I469 Cardiac arrest, cause unspecified: Secondary | ICD-10-CM | POA: Diagnosis not present

## 2023-03-24 DIAGNOSIS — Z955 Presence of coronary angioplasty implant and graft: Secondary | ICD-10-CM | POA: Diagnosis not present

## 2023-03-24 DIAGNOSIS — Z87891 Personal history of nicotine dependence: Secondary | ICD-10-CM | POA: Diagnosis not present

## 2023-03-24 DIAGNOSIS — E782 Mixed hyperlipidemia: Secondary | ICD-10-CM

## 2023-03-24 DIAGNOSIS — F1010 Alcohol abuse, uncomplicated: Secondary | ICD-10-CM | POA: Diagnosis present

## 2023-03-24 DIAGNOSIS — I252 Old myocardial infarction: Secondary | ICD-10-CM | POA: Diagnosis present

## 2023-03-24 DIAGNOSIS — R9431 Abnormal electrocardiogram [ECG] [EKG]: Secondary | ICD-10-CM | POA: Diagnosis not present

## 2023-03-24 DIAGNOSIS — I255 Ischemic cardiomyopathy: Secondary | ICD-10-CM | POA: Insufficient documentation

## 2023-03-24 DIAGNOSIS — F1011 Alcohol abuse, in remission: Secondary | ICD-10-CM

## 2023-03-24 DIAGNOSIS — I10 Essential (primary) hypertension: Secondary | ICD-10-CM

## 2023-03-24 DIAGNOSIS — R079 Chest pain, unspecified: Secondary | ICD-10-CM | POA: Diagnosis present

## 2023-03-24 LAB — BRAIN NATRIURETIC PEPTIDE: B Natriuretic Peptide: 58.5 pg/mL (ref 0.0–100.0)

## 2023-03-24 LAB — BASIC METABOLIC PANEL
Anion gap: 10 (ref 5–15)
BUN: 6 mg/dL (ref 6–20)
CO2: 28 mmol/L (ref 22–32)
Calcium: 9.3 mg/dL (ref 8.9–10.3)
Chloride: 105 mmol/L (ref 98–111)
Creatinine, Ser: 1.08 mg/dL (ref 0.61–1.24)
GFR, Estimated: >60 mL/min (ref >60)
Glucose, Bld: 94 mg/dL (ref 70–99)
Potassium: 3.9 mmol/L (ref 3.5–5.1)
Sodium: 143 mmol/L (ref 135–145)

## 2023-03-24 MED ORDER — ATORVASTATIN CALCIUM 80 MG PO TABS: 80.0000 mg | ORAL_TABLET | Freq: Every day | ORAL | 3 refills | Status: DC

## 2023-03-24 NOTE — Addendum Note (Signed)
Encounter addended by: Demetrius Charity, RN on: 03/24/2023 3:55 PM  Actions taken: Charge Capture section accepted

## 2023-03-24 NOTE — Progress Notes (Signed)
Subjective:    Patient ID: William Crawford, male    DOB: 12-05-1970, 52 y.o.   MRN: [id number]  HPI: William Crawford is a 52 y.o. male who returns for follow up appointment for chronic pain and medication refill. She states her  pain is located in her left shoulder, lower back pain, left hip pain and bilateral knee pain. He also reports generalized joint pain.Marland Kitchen He rates his pain 7. His current exercise regime is walking and performing stretching exercises.  William Crawford Morphine equivalent is 90.00 MME.   Last UDS was Performed on 01/21/2023, it was consistent.      Pain Inventory Average Pain 8 Pain Right Now 7 My pain is sharp, burning, stabbing, and aching  In the last 24 hours, has pain interfered with the following? General activity 7 Relation with others 7 Enjoyment of life 7 What TIME of day is your pain at its worst? morning , daytime, and night Sleep (in general) Fair  Pain is worse with: walking, bending, sitting, inactivity, standing, and some activites Pain improves with: rest, heat/ice, and medication Relief from Meds: 6  Family History  Problem Relation Age of Onset   Heart attack Father    Heart disease Father    Heart attack Paternal Grandfather    Heart disease Paternal Grandfather    Heart attack Paternal Uncle    Heart disease Paternal Uncle    Heart attack Paternal Uncle    Heart disease Paternal Uncle    Social History   Socioeconomic History   Marital status: Single    Spouse name: Not on file   Number of children: Not on file   Years of education: Not on file   Highest education level: Not on file  Occupational History   Not on file  Tobacco Use   Smoking status: Former    Current packs/day: 0.00    Types: Cigarettes    Quit date: 10/23/2019    Years since quitting: 3.4   Smokeless tobacco: Never  Vaping Use   Vaping status: Never Used  Substance and Sexual Activity   Alcohol use: Not Currently    Comment: QUIT  IN MAY 13244   Drug  use: No   Sexual activity: Not on file  Other Topics Concern   Not on file  Social History Narrative   Not on file   Social Determinants of Health   Financial Resource Strain: Not on file  Food Insecurity: Not on file  Transportation Needs: Not on file  Physical Activity: Not on file  Stress: Not on file  Social Connections: Not on file   Past Surgical History:  Procedure Laterality Date   APPLICATION OF WOUND VAC  10/25/2019   Procedure: Application Of Wound Vac;  Surgeon: Kerin Perna, MD;  Location: [facility] OR;  Service: Open Heart Surgery;;   CANNULATION FOR ECMO (EXTRACORPOREAL MEMBRANE OXYGENATION)  10/25/2019   Procedure: Cannulation For Ecmo (Extracorporeal Membrane Oxygenation) @ 1531;  Surgeon: Donata Clay, Theron Arista, MD;  Location: [facility] OR;  Service: Open Heart Surgery;;   CANNULATION FOR ECMO (EXTRACORPOREAL MEMBRANE OXYGENATION) N/A 10/30/2019   Procedure: DECANNULATION OF ECMO (EXTRACORPOREAL MEMBRANE OXYGENATION);  Surgeon: Donata Clay, Theron Arista, MD;  Location: [facility] OR;  Service: Open Heart Surgery;  Laterality: N/A;  Pump standby   CHEST EXPLORATION  10/25/2019   Procedure: Chest Exploration for repair of coronary perforation (repair of LAD);  Surgeon: Donata Clay, Theron Arista, MD;  Location: [facility] OR;  Service: Open Heart Surgery;;  CORONARY CTO INTERVENTION N/A 10/25/2019   Procedure: CORONARY CTO INTERVENTION;  Surgeon: Swaziland, Peter M, MD;  Location: [facility] INVASIVE CV LAB;  Service: Cardiovascular;  Laterality: N/A;   CORONARY STENT INTERVENTION N/A 10/17/2019   Procedure: CORONARY STENT INTERVENTION;  Surgeon: Tonny Bollman, MD;  Location: [facility] INVASIVE CV LAB;  Service: Cardiovascular;  Laterality: N/A;   LEFT HEART CATH AND CORONARY ANGIOGRAPHY N/A 10/17/2019   Procedure: LEFT HEART CATH AND CORONARY ANGIOGRAPHY;  Surgeon: Tonny Bollman, MD;  Location: [facility] INVASIVE CV LAB;  Service: Cardiovascular;  Laterality: N/A;   MEDIASTERNOTOMY  10/25/2019   Procedure: Median Sternotomy.;  Surgeon: Kerin Perna, MD;  Location: [facility] OR;  Service: Open Heart Surgery;;   PLACEMENT OF IMPELLA LEFT VENTRICULAR ASSIST DEVICE  10/30/2019   Procedure: Placement Of Impella Left Ventricular Assist Device 5.5 USING HEMASHIELD PLATINUM GRAFT;  Surgeon: Kerin Perna, MD;  Location: [facility] OR;  Service: Open Heart Surgery;;  graft placed into aorta   REMOVAL OF IMPELLA LEFT VENTRICULAR ASSIST DEVICE N/A 11/03/2019   Procedure: REMOVAL OF IMPELLA 5.5 LEFT VENTRICULAR ASSIST DEVICE;  Surgeon: Kerin Perna, MD;  Location: [facility] OR;  Service: Open Heart Surgery;  Laterality: N/A;   STERNAL CLOSURE N/A 10/30/2019   Procedure: Sternal Closure;  Surgeon: Kerin Perna, MD;  Location: [facility] OR;  Service: Open Heart Surgery;  Laterality: N/A;   STERNAL WIRES REMOVAL N/A 05/14/2020   Procedure: STERNAL WIRES REMOVAL;  Surgeon: Kerin Perna, MD;  Location: [facility] OR;  Service: Thoracic;  Laterality: N/A;   TEE WITHOUT CARDIOVERSION N/A 10/25/2019   Procedure: TRANSESOPHAGEAL ECHOCARDIOGRAM (TEE);  Surgeon: Donata Clay, Theron Arista, MD;  Location: [facility] OR;  Service: Open Heart Surgery;  Laterality: N/A;   TEE WITHOUT CARDIOVERSION N/A 10/30/2019   Procedure: TRANSESOPHAGEAL ECHOCARDIOGRAM (TEE);  Surgeon: Donata Clay, Theron Arista, MD;  Location: [facility] OR;  Service: Open Heart Surgery;  Laterality: N/A;   TEE WITHOUT CARDIOVERSION N/A 11/03/2019   Procedure: TRANSESOPHAGEAL ECHOCARDIOGRAM (TEE);  Surgeon: Donata Clay, Theron Arista, MD;  Location: [facility] OR;  Service: Open Heart Surgery;  Laterality: N/A;   Past Surgical History:  Procedure Laterality Date   APPLICATION OF WOUND VAC  10/25/2019   Procedure: Application Of Wound Vac;  Surgeon: Kerin Perna, MD;  Location: [facility] OR;  Service: Open Heart Surgery;;   CANNULATION FOR ECMO (EXTRACORPOREAL MEMBRANE OXYGENATION)  10/25/2019   Procedure: Cannulation For Ecmo (Extracorporeal Membrane Oxygenation) @ 1531;  Surgeon: Kerin Perna, MD;  Location: [facility] OR;  Service: Open Heart Surgery;;   CANNULATION FOR ECMO  (EXTRACORPOREAL MEMBRANE OXYGENATION) N/A 10/30/2019   Procedure: DECANNULATION OF ECMO (EXTRACORPOREAL MEMBRANE OXYGENATION);  Surgeon: Donata Clay, Theron Arista, MD;  Location: [facility] OR;  Service: Open Heart Surgery;  Laterality: N/A;  Pump standby   CHEST EXPLORATION  10/25/2019   Procedure: Chest Exploration for repair of coronary perforation (repair of LAD);  Surgeon: Donata Clay, Theron Arista, MD;  Location: [facility] OR;  Service: Open Heart Surgery;;   CORONARY CTO INTERVENTION N/A 10/25/2019   Procedure: CORONARY CTO INTERVENTION;  Surgeon: Swaziland, Peter M, MD;  Location: [facility] INVASIVE CV LAB;  Service: Cardiovascular;  Laterality: N/A;   CORONARY STENT INTERVENTION N/A 10/17/2019   Procedure: CORONARY STENT INTERVENTION;  Surgeon: Tonny Bollman, MD;  Location: [facility] INVASIVE CV LAB;  Service: Cardiovascular;  Laterality: N/A;   LEFT HEART CATH AND CORONARY ANGIOGRAPHY N/A 10/17/2019   Procedure: LEFT HEART CATH AND CORONARY ANGIOGRAPHY;  Surgeon: Tonny Bollman, MD;  Location: [facility] INVASIVE CV LAB;  Service: Cardiovascular;  Laterality: N/A;   MEDIASTERNOTOMY  10/25/2019   Procedure: Median Sternotomy.;  Surgeon: Kerin Perna, MD;  Location: [facility] OR;  Service: Open Heart Surgery;;   PLACEMENT OF IMPELLA LEFT VENTRICULAR ASSIST DEVICE  10/30/2019   Procedure: Placement Of Impella Left Ventricular Assist Device 5.5 USING HEMASHIELD PLATINUM GRAFT;  Surgeon: Kerin Perna, MD;  Location: [facility] OR;  Service: Open Heart Surgery;;  graft placed into aorta   REMOVAL OF IMPELLA LEFT VENTRICULAR ASSIST DEVICE N/A 11/03/2019   Procedure: REMOVAL OF IMPELLA 5.5 LEFT VENTRICULAR ASSIST DEVICE;  Surgeon: Kerin Perna, MD;  Location: [facility] OR;  Service: Open Heart Surgery;  Laterality: N/A;   STERNAL CLOSURE N/A 10/30/2019   Procedure: Sternal Closure;  Surgeon: Kerin Perna, MD;  Location: [facility] OR;  Service: Open Heart Surgery;  Laterality: N/A;   STERNAL WIRES REMOVAL N/A 05/14/2020   Procedure: STERNAL WIRES REMOVAL;  Surgeon: Kerin Perna, MD;  Location: [facility] OR;  Service: Thoracic;  Laterality: N/A;   TEE WITHOUT CARDIOVERSION N/A 10/25/2019   Procedure: TRANSESOPHAGEAL ECHOCARDIOGRAM (TEE);  Surgeon: Donata Clay, Theron Arista, MD;  Location: [facility] OR;  Service: Open Heart Surgery;  Laterality: N/A;   TEE WITHOUT CARDIOVERSION N/A 10/30/2019   Procedure: TRANSESOPHAGEAL ECHOCARDIOGRAM (TEE);  Surgeon: Donata Clay, Theron Arista, MD;  Location: [facility] OR;  Service: Open Heart Surgery;  Laterality: N/A;   TEE WITHOUT CARDIOVERSION N/A 11/03/2019   Procedure: TRANSESOPHAGEAL ECHOCARDIOGRAM (TEE);  Surgeon: Donata Clay, Theron Arista, MD;  Location: [facility] OR;  Service: Open Heart Surgery;  Laterality: N/A;   Past Medical History:  Diagnosis Date   Alcohol abuse    Anxiety    CAD (coronary artery disease)    Cardiac arrest (HCC)    Cardiogenic shock (HCC)    CHF (congestive heart failure) (HCC)    Cough    Dysrhythmia    GERD (gastroesophageal reflux disease)    HFrEF (heart failure with reduced ejection fraction) (HCC)    Hypertension    Hypertriglyceridemia    Ischemic cardiomyopathy    Right atrial thrombus    Tobacco abuse    There were no vitals taken for this visit.  Opioid Risk Score:   Fall Risk Score:  `1  Depression screen PHQ 2/9     01/21/2023    1:10 PM 11/03/2022    1:04 PM 10/09/2022   11:20 AM 07/27/2022    2:42 PM 06/05/2022    2:15 PM 05/05/2022    2:30 PM 04/06/2022    1:10 PM  Depression screen PHQ 2/9  Decreased Interest 0 0 0 0 0 0 0  Down, Depressed, Hopeless 0 0 0 0 0 0 0  PHQ - 2 Score 0 0 0 0 0 0 0  Altered sleeping   0      PHQ-9 Score   0        Review of Systems  Musculoskeletal:        Pain in both knees, left hip & left shoulder  All other systems reviewed and are negative.      Objective:   Physical Exam Vitals and nursing note reviewed.  Constitutional:      Appearance: Normal appearance.  Neck:     Comments: Cervical Paraspinal Tenderness: C-5-C-6 Cardiovascular:     Rate and Rhythm: Normal rate  and regular rhythm.     Pulses: Normal pulses.     Heart sounds: Normal heart sounds.  Pulmonary:     Effort: Pulmonary effort is  normal.     Breath sounds: Normal breath sounds.  Musculoskeletal:     Comments: Normal Muscle Bulk and Muscle Testing Reveals:  Upper Extremities: Full ROM and Muscle Strength  5/5 Left AC Joint Tenderness  Lumbar Paraspinal Tenderness: L-4-L-5 Lower Extremities : Full ROM and Muscle Strength 5/5 Arises from Chair with ease Narrow Based Gait     Skin:    General: Skin is warm and dry.  Neurological:     Mental Status: He is alert and oriented to person, place, and time.  Psychiatric:        Mood and Affect: Mood normal.        Behavior: Behavior normal.         Assessment & Plan:  1. Left  Shoulder Pain: Continue HEP as Tolerated. Continue current medication regimen. Continue to monitor. 03/26/2023 2.Left Greater Trochanter Bursitis:  S/P Left Hip Injection with Dr Carlis Abbott on 09/03/2020, no relief noted he reports, Continue to alternate with Heat an Ice Therapy. Continue to monitor. 03/26/2023 3. Bilateral  Knees OA: Continue HEP as Tolerated. Continue current medication regimen. 03/26/2023 3. Chest Wall Discomfort/ Neuropathic Pain: No complaints today. Continue Gabapentin. Continue to Monitor. 03/26/2023 4.Chronic Low Back Pain without sciatica: Continue HEP and Continue current medication regimen. Continue to monitor. 03/26/2023 5. Chronic Pain Syndrome: : Refilled: Oxycodone 15 mg one tablet every 6 hours  as needed for pain #120. We will continue the opioid monitoring program, this consists of regular clinic visits, examinations, urine drug screen, pill counts as well as use of [place] Controlled Substance Reporting system. A 12 month History has been reviewed on the [place] Controlled Substance Reporting System on 03/26/2023.     F/U in 1 month

## 2023-03-24 NOTE — Patient Instructions (Addendum)
Thank you for coming in today  If you had labs drawn today, any labs that are abnormal the clinic will call you No news is good news  Medications: Increase Atorvastatin to 80 mg daily  Follow up appointments: Your physician recommends that you return for lab work in:   8 weeks CMET Lipids  Your physician recommends that you schedule a follow-up appointment in:  6 months With Dr. Earlean Shawl will receive a reminder letter in the mail a few months in advance. If you don't receive a letter, please call our office to schedule the follow-up appointment.    Do the following things EVERYDAY: Weigh yourself in the morning before breakfast. Write it down and keep it in a log. Take your medicines as prescribed Eat low salt foods--Limit salt (sodium) to 2000 mg per day.  Stay as active as you can everyday Limit all fluids for the day to less than 2 liters   At the [facility], you and your health needs are our priority. As part of our continuing mission to provide you with exceptional heart care, we have created designated Provider Care Teams. These Care Teams include your primary Cardiologist (physician) and Advanced Practice Providers (APPs- Physician Assistants and Nurse Practitioners) who all work together to provide you with the care you need, when you need it.   You may see any of the following providers on your designated Care Team at your next follow up: Dr Arvilla Meres Dr Marca Ancona Dr. Marcos Eke, NP Robbie Lis, [place] [facility] [place], [place] Brynda Peon, NP Karle Plumber, PharmD   Please be sure to bring in all your medications bottles to every appointment.    Thank you for choosing [facility]  If you have any questions or concerns before your next appointment please send [place] a message through [place] or call our office at [phone].    TO LEAVE A MESSAGE FOR THE NURSE SELECT  OPTION 2, PLEASE LEAVE A MESSAGE INCLUDING: YOUR NAME DATE OF BIRTH CALL BACK NUMBER REASON FOR CALL**this is important as we prioritize the call backs  YOU WILL RECEIVE A CALL BACK THE SAME DAY AS LONG AS YOU CALL BEFORE 4:00 PM

## 2023-03-25 ENCOUNTER — Other Ambulatory Visit: Payer: Self-pay | Admitting: Cardiology

## 2023-03-26 ENCOUNTER — Encounter: Payer: Self-pay | Admitting: Registered Nurse

## 2023-03-26 ENCOUNTER — Other Ambulatory Visit: Payer: Self-pay

## 2023-03-26 ENCOUNTER — Encounter: Payer: Medicaid Other | Attending: Physical Medicine and Rehabilitation | Admitting: Registered Nurse

## 2023-03-26 VITALS — BP 142/89 | HR 80 | Ht 68.0 in | Wt 177.0 lb

## 2023-03-26 DIAGNOSIS — G8929 Other chronic pain: Secondary | ICD-10-CM | POA: Insufficient documentation

## 2023-03-26 DIAGNOSIS — M545 Low back pain, unspecified: Secondary | ICD-10-CM | POA: Insufficient documentation

## 2023-03-26 DIAGNOSIS — M2550 Pain in unspecified joint: Secondary | ICD-10-CM | POA: Insufficient documentation

## 2023-03-26 DIAGNOSIS — M25512 Pain in left shoulder: Secondary | ICD-10-CM | POA: Insufficient documentation

## 2023-03-26 DIAGNOSIS — Z5181 Encounter for therapeutic drug level monitoring: Secondary | ICD-10-CM | POA: Diagnosis present

## 2023-03-26 DIAGNOSIS — M170 Bilateral primary osteoarthritis of knee: Secondary | ICD-10-CM | POA: Insufficient documentation

## 2023-03-26 DIAGNOSIS — Z79899 Other long term (current) drug therapy: Secondary | ICD-10-CM | POA: Diagnosis present

## 2023-03-26 DIAGNOSIS — G894 Chronic pain syndrome: Secondary | ICD-10-CM | POA: Diagnosis present

## 2023-03-26 MED ORDER — CYCLOBENZAPRINE HCL 10 MG PO TABS: 10.0000 mg | ORAL_TABLET | Freq: Three times a day (TID) | ORAL | 0 refills | Status: DC | PRN

## 2023-03-26 MED ORDER — OXYCODONE HCL 15 MG PO TABS: 15.0000 mg | ORAL_TABLET | Freq: Four times a day (QID) | ORAL | 0 refills | Status: DC | PRN

## 2023-03-26 MED ORDER — AMITRIPTYLINE HCL 25 MG PO TABS: 25.0000 mg | ORAL_TABLET | Freq: Every day | ORAL | 3 refills | Status: DC

## 2023-04-08 ENCOUNTER — Other Ambulatory Visit: Payer: Self-pay | Admitting: Physical Medicine and Rehabilitation

## 2023-04-22 ENCOUNTER — Other Ambulatory Visit: Payer: Self-pay | Admitting: Physical Medicine and Rehabilitation

## 2023-04-26 ENCOUNTER — Other Ambulatory Visit: Payer: Self-pay | Admitting: Physical Medicine and Rehabilitation

## 2023-04-26 ENCOUNTER — Telehealth: Payer: Self-pay | Admitting: Registered Nurse

## 2023-04-26 MED ORDER — GABAPENTIN 800 MG PO TABS: 800.0000 mg | ORAL_TABLET | Freq: Three times a day (TID) | ORAL | 3 refills | Status: DC

## 2023-04-26 MED ORDER — CYCLOBENZAPRINE HCL 10 MG PO TABS: 10.0000 mg | ORAL_TABLET | Freq: Three times a day (TID) | ORAL | 0 refills | Status: DC | PRN

## 2023-04-26 NOTE — Telephone Encounter (Signed)
Call placed to William Crawford, he reports his Gabapentin prescription has expired. Gabapentin and Cyclobenzaprine prescription sent to pharmacy, he verbalizes understanding.

## 2023-04-26 NOTE — Telephone Encounter (Signed)
Per pmp last fill  03/25/2023 04/06/2022 1  Gabapentin 800 Mg Tablet 90.00 30 Kr Rau [id number] Nor ([id number]) 1/3  Private Pay [place]

## 2023-04-27 ENCOUNTER — Other Ambulatory Visit: Payer: Self-pay | Admitting: Physical Medicine and Rehabilitation

## 2023-04-30 ENCOUNTER — Encounter: Payer: Medicaid Other | Attending: Physical Medicine and Rehabilitation | Admitting: Registered Nurse

## 2023-04-30 ENCOUNTER — Encounter: Payer: Self-pay | Admitting: Registered Nurse

## 2023-04-30 VITALS — BP 134/82 | HR 89 | Ht 68.0 in | Wt 183.0 lb

## 2023-04-30 DIAGNOSIS — M545 Low back pain, unspecified: Secondary | ICD-10-CM | POA: Insufficient documentation

## 2023-04-30 DIAGNOSIS — G8929 Other chronic pain: Secondary | ICD-10-CM | POA: Diagnosis present

## 2023-04-30 DIAGNOSIS — Z79899 Other long term (current) drug therapy: Secondary | ICD-10-CM | POA: Diagnosis present

## 2023-04-30 DIAGNOSIS — M2550 Pain in unspecified joint: Secondary | ICD-10-CM | POA: Diagnosis present

## 2023-04-30 DIAGNOSIS — M170 Bilateral primary osteoarthritis of knee: Secondary | ICD-10-CM | POA: Diagnosis not present

## 2023-04-30 DIAGNOSIS — M25512 Pain in left shoulder: Secondary | ICD-10-CM | POA: Diagnosis not present

## 2023-04-30 DIAGNOSIS — Z5181 Encounter for therapeutic drug level monitoring: Secondary | ICD-10-CM | POA: Diagnosis present

## 2023-04-30 DIAGNOSIS — G894 Chronic pain syndrome: Secondary | ICD-10-CM | POA: Diagnosis not present

## 2023-04-30 MED ORDER — OXYCODONE HCL 15 MG PO TABS: 15.0000 mg | ORAL_TABLET | Freq: Four times a day (QID) | ORAL | 0 refills | Status: DC | PRN

## 2023-04-30 NOTE — Progress Notes (Signed)
Subjective:    Patient ID: William Crawford, male    DOB: 1970/06/28, 52 y.o.   MRN: [id number]  HPI: William Crawford is a 52 y.o. male who returns for follow up appointment for chronic pain and medication refill. He states his pain is located in his left shoulder, lower back pain and bilateral knee pain. She rates her pain 8. His  current exercise regime is walking and performing stretching exercises.  Mr. Sherrow Morphine equivalent is 90.00 MME.   Last UDS was Performed on 01/21/2023, it was consistent.    Pain Inventory Average Pain 7 Pain Right Now 8 My pain is sharp, burning, stabbing, tingling, and aching  In the last 24 hours, has pain interfered with the following? General activity 7 Relation with others 7 Enjoyment of life 7 What TIME of day is your pain at its worst? morning , evening, and night Sleep (in general) Fair  Pain is worse with: sitting, inactivity, and standing Pain improves with: rest, heat/ice, and medication Relief from Meds: 6  Family History  Problem Relation Age of Onset   Heart attack Father    Heart disease Father    Heart attack Paternal Grandfather    Heart disease Paternal Grandfather    Heart attack Paternal Uncle    Heart disease Paternal Uncle    Heart attack Paternal Uncle    Heart disease Paternal Uncle    Social History   Socioeconomic History   Marital status: Single    Spouse name: Not on file   Number of children: Not on file   Years of education: Not on file   Highest education level: Not on file  Occupational History   Not on file  Tobacco Use   Smoking status: Former    Current packs/day: 0.00    Types: Cigarettes    Quit date: 10/23/2019    Years since quitting: 3.5   Smokeless tobacco: Never  Vaping Use   Vaping status: Never Used  Substance and Sexual Activity   Alcohol use: Not Currently    Comment: QUIT  IN MAY 19147   Drug use: No   Sexual activity: Not on file  Other Topics Concern   Not on file   Social History Narrative   Not on file   Social Determinants of Health   Financial Resource Strain: Not on file  Food Insecurity: Not on file  Transportation Needs: Not on file  Physical Activity: Not on file  Stress: Not on file  Social Connections: Not on file   Past Surgical History:  Procedure Laterality Date   APPLICATION OF WOUND VAC  10/25/2019   Procedure: Application Of Wound Vac;  Surgeon: Kerin Perna, MD;  Location: [facility] OR;  Service: Open Heart Surgery;;   CANNULATION FOR ECMO (EXTRACORPOREAL MEMBRANE OXYGENATION)  10/25/2019   Procedure: Cannulation For Ecmo (Extracorporeal Membrane Oxygenation) @ 1531;  Surgeon: Donata Clay, Theron Arista, MD;  Location: [facility] OR;  Service: Open Heart Surgery;;   CANNULATION FOR ECMO (EXTRACORPOREAL MEMBRANE OXYGENATION) N/A 10/30/2019   Procedure: DECANNULATION OF ECMO (EXTRACORPOREAL MEMBRANE OXYGENATION);  Surgeon: Donata Clay, Theron Arista, MD;  Location: [facility] OR;  Service: Open Heart Surgery;  Laterality: N/A;  Pump standby   CHEST EXPLORATION  10/25/2019   Procedure: Chest Exploration for repair of coronary perforation (repair of LAD);  Surgeon: Donata Clay, Theron Arista, MD;  Location: [facility] OR;  Service: Open Heart Surgery;;   CORONARY CTO INTERVENTION N/A 10/25/2019   Procedure: CORONARY CTO INTERVENTION;  Surgeon: [place],  Demetria Pore, MD;  Location: [facility] INVASIVE CV LAB;  Service: Cardiovascular;  Laterality: N/A;   CORONARY STENT INTERVENTION N/A 10/17/2019   Procedure: CORONARY STENT INTERVENTION;  Surgeon: Tonny Bollman, MD;  Location: [facility] INVASIVE CV LAB;  Service: Cardiovascular;  Laterality: N/A;   LEFT HEART CATH AND CORONARY ANGIOGRAPHY N/A 10/17/2019   Procedure: LEFT HEART CATH AND CORONARY ANGIOGRAPHY;  Surgeon: Tonny Bollman, MD;  Location: [facility] INVASIVE CV LAB;  Service: Cardiovascular;  Laterality: N/A;   MEDIASTERNOTOMY  10/25/2019   Procedure: Median Sternotomy.;  Surgeon: Kerin Perna, MD;  Location: [facility] OR;  Service: Open Heart Surgery;;   PLACEMENT OF  IMPELLA LEFT VENTRICULAR ASSIST DEVICE  10/30/2019   Procedure: Placement Of Impella Left Ventricular Assist Device 5.5 USING HEMASHIELD PLATINUM GRAFT;  Surgeon: Kerin Perna, MD;  Location: [facility] OR;  Service: Open Heart Surgery;;  graft placed into aorta   REMOVAL OF IMPELLA LEFT VENTRICULAR ASSIST DEVICE N/A 11/03/2019   Procedure: REMOVAL OF IMPELLA 5.5 LEFT VENTRICULAR ASSIST DEVICE;  Surgeon: Kerin Perna, MD;  Location: [facility] OR;  Service: Open Heart Surgery;  Laterality: N/A;   STERNAL CLOSURE N/A 10/30/2019   Procedure: Sternal Closure;  Surgeon: Kerin Perna, MD;  Location: [facility] OR;  Service: Open Heart Surgery;  Laterality: N/A;   STERNAL WIRES REMOVAL N/A 05/14/2020   Procedure: STERNAL WIRES REMOVAL;  Surgeon: Kerin Perna, MD;  Location: [facility] OR;  Service: Thoracic;  Laterality: N/A;   TEE WITHOUT CARDIOVERSION N/A 10/25/2019   Procedure: TRANSESOPHAGEAL ECHOCARDIOGRAM (TEE);  Surgeon: Donata Clay, Theron Arista, MD;  Location: [facility] OR;  Service: Open Heart Surgery;  Laterality: N/A;   TEE WITHOUT CARDIOVERSION N/A 10/30/2019   Procedure: TRANSESOPHAGEAL ECHOCARDIOGRAM (TEE);  Surgeon: Donata Clay, Theron Arista, MD;  Location: [facility] OR;  Service: Open Heart Surgery;  Laterality: N/A;   TEE WITHOUT CARDIOVERSION N/A 11/03/2019   Procedure: TRANSESOPHAGEAL ECHOCARDIOGRAM (TEE);  Surgeon: Donata Clay, Theron Arista, MD;  Location: [facility] OR;  Service: Open Heart Surgery;  Laterality: N/A;   Past Surgical History:  Procedure Laterality Date   APPLICATION OF WOUND VAC  10/25/2019   Procedure: Application Of Wound Vac;  Surgeon: Kerin Perna, MD;  Location: [facility] OR;  Service: Open Heart Surgery;;   CANNULATION FOR ECMO (EXTRACORPOREAL MEMBRANE OXYGENATION)  10/25/2019   Procedure: Cannulation For Ecmo (Extracorporeal Membrane Oxygenation) @ 1531;  Surgeon: Kerin Perna, MD;  Location: [facility] OR;  Service: Open Heart Surgery;;   CANNULATION FOR ECMO (EXTRACORPOREAL MEMBRANE OXYGENATION) N/A 10/30/2019   Procedure:  DECANNULATION OF ECMO (EXTRACORPOREAL MEMBRANE OXYGENATION);  Surgeon: Donata Clay, Theron Arista, MD;  Location: [facility] OR;  Service: Open Heart Surgery;  Laterality: N/A;  Pump standby   CHEST EXPLORATION  10/25/2019   Procedure: Chest Exploration for repair of coronary perforation (repair of LAD);  Surgeon: Donata Clay, Theron Arista, MD;  Location: [facility] OR;  Service: Open Heart Surgery;;   CORONARY CTO INTERVENTION N/A 10/25/2019   Procedure: CORONARY CTO INTERVENTION;  Surgeon: Swazil[place], Peter M, MD;  Location: [facility] INVASIVE CV LAB;  Service: Cardiovascular;  Laterality: N/A;   CORONARY STENT INTERVENTION N/A 10/17/2019   Procedure: CORONARY STENT INTERVENTION;  Surgeon: Tonny Bollman, MD;  Location: [facility] INVASIVE CV LAB;  Service: Cardiovascular;  Laterality: N/A;   LEFT HEART CATH AND CORONARY ANGIOGRAPHY N/A 10/17/2019   Procedure: LEFT HEART CATH AND CORONARY ANGIOGRAPHY;  Surgeon: Tonny Bollman, MD;  Location: [facility] INVASIVE CV LAB;  Service: Cardiovascular;  Laterality: N/A;   MEDIASTERNOTOMY  10/25/2019   Procedure:  Median Sternotomy.;  Surgeon: Kerin Perna, MD;  Location: [facility] OR;  Service: Open Heart Surgery;;   PLACEMENT OF IMPELLA LEFT VENTRICULAR ASSIST DEVICE  10/30/2019   Procedure: Placement Of Impella Left Ventricular Assist Device 5.5 USING HEMASHIELD PLATINUM GRAFT;  Surgeon: Kerin Perna, MD;  Location: [facility] OR;  Service: Open Heart Surgery;;  graft placed into aorta   REMOVAL OF IMPELLA LEFT VENTRICULAR ASSIST DEVICE N/A 11/03/2019   Procedure: REMOVAL OF IMPELLA 5.5 LEFT VENTRICULAR ASSIST DEVICE;  Surgeon: Kerin Perna, MD;  Location: [facility] OR;  Service: Open Heart Surgery;  Laterality: N/A;   STERNAL CLOSURE N/A 10/30/2019   Procedure: Sternal Closure;  Surgeon: Kerin Perna, MD;  Location: [facility] OR;  Service: Open Heart Surgery;  Laterality: N/A;   STERNAL WIRES REMOVAL N/A 05/14/2020   Procedure: STERNAL WIRES REMOVAL;  Surgeon: Kerin Perna, MD;  Location: [facility] OR;  Service: Thoracic;  Laterality:  N/A;   TEE WITHOUT CARDIOVERSION N/A 10/25/2019   Procedure: TRANSESOPHAGEAL ECHOCARDIOGRAM (TEE);  Surgeon: Donata Clay, Theron Arista, MD;  Location: [facility] OR;  Service: Open Heart Surgery;  Laterality: N/A;   TEE WITHOUT CARDIOVERSION N/A 10/30/2019   Procedure: TRANSESOPHAGEAL ECHOCARDIOGRAM (TEE);  Surgeon: Donata Clay, Theron Arista, MD;  Location: [facility] OR;  Service: Open Heart Surgery;  Laterality: N/A;   TEE WITHOUT CARDIOVERSION N/A 11/03/2019   Procedure: TRANSESOPHAGEAL ECHOCARDIOGRAM (TEE);  Surgeon: Donata Clay, Theron Arista, MD;  Location: [facility] OR;  Service: Open Heart Surgery;  Laterality: N/A;   Past Medical History:  Diagnosis Date   Alcohol abuse    Anxiety    CAD (coronary artery disease)    Cardiac arrest (HCC)    Cardiogenic shock (HCC)    CHF (congestive heart failure) (HCC)    Cough    Dysrhythmia    GERD (gastroesophageal reflux disease)    HFrEF (heart failure with reduced ejection fraction) (HCC)    Hypertension    Hypertriglyceridemia    Ischemic cardiomyopathy    Right atrial thrombus    Tobacco abuse    BP 134/82   Pulse 89   Ht 5\' 8"  (1.727 m)   Wt 183 lb (83 kg)   SpO2 98%   BMI 27.83 kg/m   Opioid Risk Score:   Fall Risk Score:  `1  Depression screen PHQ 2/9     03/26/2023    1:14 PM 01/21/2023    1:10 PM 11/03/2022    1:04 PM 10/09/2022   11:20 AM 07/27/2022    2:42 PM 06/05/2022    2:15 PM 05/05/2022    2:30 PM  Depression screen PHQ 2/9  Decreased Interest 0 0 0 0 0 0 0  Down, Depressed, Hopeless 0 0 0 0 0 0 0  PHQ - 2 Score 0 0 0 0 0 0 0  Altered sleeping    0     PHQ-9 Score    0         Review of Systems  Musculoskeletal:  Positive for back pain.       Left shoulder pain Bilateral knee pain Bilateral hand pain  All other systems reviewed and are negative.      Objective:   Physical Exam Vitals and nursing note reviewed.  Constitutional:      Appearance: Normal appearance.  Cardiovascular:     Rate and Rhythm: Normal rate and regular rhythm.     Pulses:  Normal pulses.     Heart sounds: Normal heart sounds.  Pulmonary:  Effort: Pulmonary effort is normal.     Breath sounds: Normal breath sounds.  Musculoskeletal:     Comments: Normal Muscle Bulk and Muscle Testing Reveals:  Upper Extremities: Full ROM and Muscle Strength  5/5 Lumbar Paraspinal Tenderness: L-3-L-5 Lower Extremities: Full ROM and Muscle Strength 5/5 Arises from Chair with ease Narrow Based Gait     Skin:    General: Skin is warm and dry.  Neurological:     Mental Status: He is alert and oriented to person, place, and time.  Psychiatric:        Mood and Affect: Mood normal.        Behavior: Behavior normal.         Assessment & Plan:  1. Chronic Left  Shoulder Pain: Continue HEP as Tolerated. Continue current medication regimen. Continue to monitor. 04/30/2023 2.Left Greater Trochanter Bursitis: No complaints today.  S/P Left Hip Injection with Dr Carlis Abbott on 09/03/2020, no relief noted he reports, Continue to alternate with Heat an Ice Therapy. Continue to monitor. 04/30/2023 3. Bilateral  Knees OA: Continue HEP as Tolerated. Continue current medication regimen. 04/30/2023 3. Chest Wall Discomfort/ Neuropathic Pain: No complaints today. Continue Gabapentin. Continue to Monitor. 04/30/2023 4.Chronic Low Back Pain without sciatica: Continue HEP and Continue current medication regimen. Continue to monitor. 04/30/2023 5. Chronic Pain Syndrome: : Refilled: Oxycodone 15 mg one tablet every 6 hours  as needed for pain #120. We will continue the opioid monitoring program, this consists of regular clinic visits, examinations, urine drug screen, pill counts as well as use of [place] Controlled Substance Reporting system. A 12 month History has been reviewed on the [place] Controlled Substance Reporting System on 04/30/2023.     F/U in 1 month

## 2023-05-04 ENCOUNTER — Telehealth: Payer: Self-pay

## 2023-05-04 NOTE — Telephone Encounter (Signed)
PA submitted, Timofey Essary called in with [facility] and they advised Berkley Harvey was required for oxycodone  Damita Lack (Key: [id number])

## 2023-05-05 NOTE — Telephone Encounter (Signed)
error 

## 2023-05-05 NOTE — Telephone Encounter (Signed)
PA approved through 08/27/23 PA# [id number], call was placed to pharmacy to have oxycodone ready for patient,  Called was place to patient to advise pharmacy will have medication ready for him

## 2023-05-11 ENCOUNTER — Other Ambulatory Visit: Payer: Self-pay | Admitting: Registered Nurse

## 2023-05-11 ENCOUNTER — Other Ambulatory Visit (HOSPITAL_COMMUNITY): Payer: Self-pay | Admitting: Cardiology

## 2023-05-11 ENCOUNTER — Other Ambulatory Visit: Payer: Self-pay | Admitting: Physical Medicine and Rehabilitation

## 2023-05-12 ENCOUNTER — Ambulatory Visit (HOSPITAL_COMMUNITY)
Admission: RE | Admit: 2023-05-12 | Discharge: 2023-05-12 | Disposition: A | Discharge status: Home / Self Care | Payer: Medicaid Other | Source: Ambulatory Visit | Attending: Cardiology | Admitting: Cardiology

## 2023-05-12 DIAGNOSIS — I5022 Chronic systolic (congestive) heart failure: Secondary | ICD-10-CM | POA: Insufficient documentation

## 2023-05-12 LAB — COMPREHENSIVE METABOLIC PANEL
ALT: 15 U/L (ref 0–44)
AST: 18 U/L (ref 15–41)
Albumin: 4.1 g/dL (ref 3.5–5.0)
Alkaline Phosphatase: 59 U/L (ref 38–126)
Anion gap: 8 (ref 5–15)
BUN: 8 mg/dL (ref 6–20)
CO2: 28 mmol/L (ref 22–32)
Calcium: 9 mg/dL (ref 8.9–10.3)
Chloride: 105 mmol/L (ref 98–111)
Creatinine, Ser: 1.1 mg/dL (ref 0.61–1.24)
GFR, Estimated: >60 mL/min (ref >60)
Glucose, Bld: 117 mg/dL — ABNORMAL HIGH (ref 70–99)
Potassium: 3.8 mmol/L (ref 3.5–5.1)
Sodium: 141 mmol/L (ref 135–145)
Total Bilirubin: 0.4 mg/dL (ref <1.2)
Total Protein: 6.9 g/dL (ref 6.5–8.1)

## 2023-05-12 LAB — LIPID PANEL
Cholesterol: 142 mg/dL (ref 0–200)
HDL: 39 mg/dL — ABNORMAL LOW (ref >40)
LDL Cholesterol: 71 mg/dL (ref 0–99)
Total CHOL/HDL Ratio: 3.6 {ratio}
Triglycerides: 159 mg/dL — ABNORMAL HIGH (ref <150)
VLDL: 32 mg/dL (ref 0–40)

## 2023-05-28 NOTE — Progress Notes (Signed)
 Subjective:    Patient ID: William Crawford, male    DOB: 1970-12-22, 53 y.o.   MRN: [id number]  HPI: William Crawford is a 53 y.o. male who returns for follow up appointment for chronic pain and medication refill. He states his  pain is located in his lower back, bilateral hips and bilateral knee pain. Also reports generalized joint pain. He  rates his pain 8. His current exercise regime is walking and performing stretching exercises.  William Crawford Morphine  equivalent is 90.00 MME.   UDS ordered today.    Pain Inventory Average Pain 7 Pain Right Now 8 My pain is constant, sharp, burning, dull, stabbing, and tingling  In the last 24 hours, has pain interfered with the following? General activity 7 Relation with others 0 Enjoyment of life 0 What TIME of day is your pain at its worst? morning , daytime, evening, and night Sleep (in general) Poor  Pain is worse with: walking, bending, sitting, inactivity, standing, and some activites Pain improves with: medication and heat Relief from Meds: 5  Family History  Problem Relation Age of Onset   Heart attack Father    Heart disease Father    Heart attack Paternal Grandfather    Heart disease Paternal Grandfather    Heart attack Paternal Uncle    Heart disease Paternal Uncle    Heart attack Paternal Uncle    Heart disease Paternal Uncle    Social History   Socioeconomic History   Marital status: Single    Spouse name: Not on file   Number of children: Not on file   Years of education: Not on file   Highest education level: Not on file  Occupational History   Not on file  Tobacco Use   Smoking status: Former    Current packs/day: 0.00    Types: Cigarettes    Quit date: 10/23/2019    Years since quitting: 3.5   Smokeless tobacco: Never  Vaping Use   Vaping status: Never Used  Substance and Sexual Activity   Alcohol use: Not Currently    Comment: QUIT  IN MAY [id number]   Drug use: No   Sexual activity: Not on file  Other  Topics Concern   Not on file  Social History Narrative   Not on file   Social Drivers of Health   Financial Resource Strain: Not on file  Food Insecurity: Not on file  Transportation Needs: Not on file  Physical Activity: Not on file  Stress: Not on file  Social Connections: Not on file   Past Surgical History:  Procedure Laterality Date   APPLICATION OF WOUND VAC  10/25/2019   Procedure: Application Of Wound Vac;  Surgeon: Fleeta Hanford Coy, MD;  Location: [facility] OR;  Service: Open Heart Surgery;;   CANNULATION FOR ECMO (EXTRACORPOREAL MEMBRANE OXYGENATION)  10/25/2019   Procedure: Cannulation For Ecmo (Extracorporeal Membrane Oxygenation) @ 1531;  Surgeon: Fleeta Hanford, Coy, MD;  Location[facility] OR;  Service: Open Heart Surgery;;   CANNULATION FOR ECMO (EXTRACORPOREAL MEMBRANE OXYGENATION) N/A 10/30/2019   Procedure: DECANNULATION OF ECMO (EXTRACORPOREAL MEMBRANE OXYGENATION);  Surgeon: Fleeta Hanford, Coy, MD;  Location: [facility] OR;  Service: Open Heart Surgery;  Laterality: N/A;  Pump standby   CHEST EXPLORATION  10/25/2019   Procedure: Chest Exploration for repair of coronary perforation (repair of LAD);  Surgeon: Fleeta Hanford, Coy, MD;  Location: [facility] OR;  Service: Open Heart Surgery;;   CORONARY CTO INTERVENTION N/A 10/25/2019   Procedure: CORONARY CTO  INTERVENTION;  Surgeon: Jordan, Peter M, MD;  Location: [facility] INVASIVE CV LAB;  Service: Cardiovascular;  Laterality: N/A;   CORONARY STENT INTERVENTION N/A 10/17/2019   Procedure: CORONARY STENT INTERVENTION;  Surgeon: Wonda Sharper, MD;  Location: [facility] INVASIVE CV LAB;  Service: Cardiovascular;  Laterality: N/A;   LEFT HEART CATH AND CORONARY ANGIOGRAPHY N/A 10/17/2019   Procedure: LEFT HEART CATH AND CORONARY ANGIOGRAPHY;  Surgeon: Wonda Sharper, MD;  Location: [facility] INVASIVE CV LAB;  Service: Cardiovascular;  Laterality: N/A;   MEDIASTERNOTOMY  10/25/2019   Procedure: Median Sternotomy.;  Surgeon: Fleeta Hanford Coy, MD;  Location: [facility] OR;  Service: Open Heart  Surgery;;   PLACEMENT OF IMPELLA LEFT VENTRICULAR ASSIST DEVICE  10/30/2019   Procedure: Placement Of Impella Left Ventricular Assist Device 5.5 USING HEMASHIELD PLATINUM GRAFT;  Surgeon: Fleeta Hanford Coy, MD;  Location: [facility] OR;  Service: Open Heart Surgery;;  graft placed into aorta   REMOVAL OF IMPELLA LEFT VENTRICULAR ASSIST DEVICE N/A 11/03/2019   Procedure: REMOVAL OF IMPELLA 5.5 LEFT VENTRICULAR ASSIST DEVICE;  Surgeon: Fleeta Hanford Coy, MD;  Location: [facility] OR;  Service: Open Heart Surgery;  Laterality: N/A;   STERNAL CLOSURE N/A 10/30/2019   Procedure: Sternal Closure;  Surgeon: Fleeta Hanford Coy, MD;  Location: [facility] OR;  Service: Open Heart Surgery;  Laterality: N/A;   STERNAL WIRES REMOVAL N/A 05/14/2020   Procedure: STERNAL WIRES REMOVAL;  Surgeon: Fleeta Hanford Coy, MD;  Location: [facility] OR;  Service: Thoracic;  Laterality: N/A;   TEE WITHOUT CARDIOVERSION N/A 10/25/2019   Procedure: TRANSESOPHAGEAL ECHOCARDIOGRAM (TEE);  Surgeon: Fleeta Hanford, Coy, MD;  Location: [facility] OR;  Service: Open Heart Surgery;  Laterality: N/A;   TEE WITHOUT CARDIOVERSION N/A 10/30/2019   Procedure: TRANSESOPHAGEAL ECHOCARDIOGRAM (TEE);  Surgeon: Fleeta Hanford, Coy, MD;  Location: [facility] OR;  Service: Open Heart Surgery;  Laterality: N/A;   TEE WITHOUT CARDIOVERSION N/A 11/03/2019   Procedure: TRANSESOPHAGEAL ECHOCARDIOGRAM (TEE);  Surgeon: Fleeta Hanford, Coy, MD;  Location: [facility] OR;  Service: Open Heart Surgery;  Laterality: N/A;   Past Surgical History:  Procedure Laterality Date   APPLICATION OF WOUND VAC  10/25/2019   Procedure: Application Of Wound Vac;  Surgeon: Fleeta Hanford Coy, MD;  Location: [facility] OR;  Service: Open Heart Surgery;;   CANNULATION FOR ECMO (EXTRACORPOREAL MEMBRANE OXYGENATION)  10/25/2019   Procedure: Cannulation For Ecmo (Extracorporeal Membrane Oxygenation) @ 1531;  Surgeon: Fleeta Hanford Coy, MD;  Location: [facility] OR;  Service: Open Heart Surgery;;   CANNULATION FOR ECMO (EXTRACORPOREAL MEMBRANE OXYGENATION) N/A 10/30/2019    Procedure: DECANNULATION OF ECMO (EXTRACORPOREAL MEMBRANE OXYGENATION);  Surgeon: Fleeta Hanford, Coy, MD;  Location: [facility] OR;  Service: Open Heart Surgery;  Laterality: N/A;  Pump standby   CHEST EXPLORATION  10/25/2019   Procedure: Chest Exploration for repair of coronary perforation (repair of LAD);  Surgeon: Fleeta Hanford, Coy, MD;  Location: [facility] OR;  Service: Open Heart Surgery;;   CORONARY CTO INTERVENTION N/A 10/25/2019   Procedure: CORONARY CTO INTERVENTION;  Surgeon: Jordan, Peter M, MD;  Location: [facility] INVASIVE CV LAB;  Service: Cardiovascular;  Laterality: N/A;   CORONARY STENT INTERVENTION N/A 10/17/2019   Procedure: CORONARY STENT INTERVENTION;  Surgeon: Wonda Sharper, MD;  Location: [facility] INVASIVE CV LAB;  Service: Cardiovascular;  Laterality: N/A;   LEFT HEART CATH AND CORONARY ANGIOGRAPHY N/A 10/17/2019   Procedure: LEFT HEART CATH AND CORONARY ANGIOGRAPHY;  Surgeon: Wonda Sharper, MD;  Location: [facility] INVASIVE CV LAB;  Service: Cardiovascular;  Laterality: N/A;   MEDIASTERNOTOMY  10/25/2019   Procedure: Median Sternotomy.;  Surgeon: Fleeta Hanford Coy, MD;  Location: [facility] OR;  Service: Open Heart Surgery;;   PLACEMENT OF IMPELLA LEFT VENTRICULAR ASSIST DEVICE  10/30/2019   Procedure: Placement Of Impella Left Ventricular Assist Device 5.5 USING HEMASHIELD PLATINUM GRAFT;  Surgeon: Fleeta Hanford Coy, MD;  Location: [facility] OR;  Service: Open Heart Surgery;;  graft placed into aorta   REMOVAL OF IMPELLA LEFT VENTRICULAR ASSIST DEVICE N/A 11/03/2019   Procedure: REMOVAL OF IMPELLA 5.5 LEFT VENTRICULAR ASSIST DEVICE;  Surgeon: Fleeta Hanford Coy, MD;  Location: [facility] OR;  Service: Open Heart Surgery;  Laterality: N/A;   STERNAL CLOSURE N/A 10/30/2019   Procedure: Sternal Closure;  Surgeon: Fleeta Hanford Coy, MD;  Location: [facility] OR;  Service: Open Heart Surgery;  Laterality: N/A;   STERNAL WIRES REMOVAL N/A 05/14/2020   Procedure: STERNAL WIRES REMOVAL;  Surgeon: Fleeta Hanford Coy, MD;  Location: [facility] OR;  Service:  Thoracic;  Laterality: N/A;   TEE WITHOUT CARDIOVERSION N/A 10/25/2019   Procedure: TRANSESOPHAGEAL ECHOCARDIOGRAM (TEE);  Surgeon: Fleeta Hanford, Coy, MD;  Location: [facility] OR;  Service: Open Heart Surgery;  Laterality: N/A;   TEE WITHOUT CARDIOVERSION N/A 10/30/2019   Procedure: TRANSESOPHAGEAL ECHOCARDIOGRAM (TEE);  Surgeon: Fleeta Hanford, Coy, MD;  Location: [facility] OR;  Service: Open Heart Surgery;  Laterality: N/A;   TEE WITHOUT CARDIOVERSION N/A 11/03/2019   Procedure: TRANSESOPHAGEAL ECHOCARDIOGRAM (TEE);  Surgeon: Fleeta Hanford, Coy, MD;  Location: [facility] OR;  Service: Open Heart Surgery;  Laterality: N/A;   Past Medical History:  Diagnosis Date   Alcohol abuse    Anxiety    CAD (coronary artery disease)    Cardiac arrest (HCC)    Cardiogenic shock (HCC)    CHF (congestive heart failure) (HCC)    Cough    Dysrhythmia    GERD (gastroesophageal reflux disease)    HFrEF (heart failure with reduced ejection fraction) (HCC)    Hypertension    Hypertriglyceridemia    Ischemic cardiomyopathy    Right atrial thrombus    Tobacco abuse    There were no vitals taken for this visit.  Opioid Risk Score:   Fall Risk Score:  `1  Depression screen PHQ 2/9     03/26/2023    1:14 PM 01/21/2023    1:10 PM 11/03/2022    1:04 PM 10/09/2022   11:20 AM 07/27/2022    2:42 PM 06/05/2022    2:15 PM 05/05/2022    2:30 PM  Depression screen PHQ 2/9  Decreased Interest 0 0 0 0 0 0 0  Down, Depressed, Hopeless 0 0 0 0 0 0 0  PHQ - 2 Score 0 0 0 0 0 0 0  Altered sleeping    0     PHQ-9 Score    0       Review of Systems  Musculoskeletal:  Positive for back pain.       Pain in both hips & chest  All other systems reviewed and are negative.      Objective:   Physical Exam Vitals and nursing note reviewed.  Constitutional:      Appearance: Normal appearance.  Cardiovascular:     Rate and Rhythm: Normal rate and regular rhythm.     Pulses: Normal pulses.     Heart sounds: Normal heart sounds.  Pulmonary:      Effort: Pulmonary effort is normal.     Breath sounds: Normal breath sounds.  Musculoskeletal:     Comments: Normal  Muscle Bulk and Muscle Testing Reveals:  Upper Extremities: Full ROM and Muscle Strength 5/5 Lumbar Paraspinal Tenderness: L-4-L-5 Bilateral Greater Trochanter Tenderness Lower Extremities: Full ROM and Muscle Strength 5/5 Arises from Table with ease Narrow Based  Gait     Skin:    General: Skin is warm and dry.  Neurological:     Mental Status: He is alert and oriented to person, place, and time.  Psychiatric:        Mood and Affect: Mood normal.        Behavior: Behavior normal.         Assessment & Plan:  1. Chronic Left  Shoulder Pain:No complaints today.  Continue HEP as Tolerated. Continue current medication regimen. Continue to monitor. 05/31/2023 2.Bilateral Greater Trochanter Bursitis:  S/P Left Hip Injection with Dr Lorilee on 09/03/2020, no relief noted he reports, Continue to alternate with Heat an Ice Therapy. Continue to monitor. 05/31/2023 3. Bilateral  Knees OA: Continue HEP as Tolerated. Continue current medication regimen. 05/31/2023 4. Chest Wall Discomfort/ Neuropathic Pain: No complaints today. Continue Gabapentin . Continue to Monitor. 05/31/2023 5.Chronic Low Back Pain without sciatica: Continue HEP and Continue current medication regimen. Continue to monitor. 05/31/2023 6. Chronic Pain Syndrome: : Refilled: Oxycodone  15 mg one tablet every 6 hours  as needed for pain #120. We will continue the opioid monitoring program, this consists of regular clinic visits, examinations, urine drug screen, pill counts as well as use of [place]  Controlled Substance Reporting system. A 12 month History has been reviewed on the [place]  Controlled Substance Reporting System on 05/31/2023.     F/U in 1 month

## 2023-05-31 ENCOUNTER — Encounter: Payer: Self-pay | Admitting: Registered Nurse

## 2023-05-31 ENCOUNTER — Encounter: Payer: Medicaid Other | Attending: Physical Medicine and Rehabilitation | Admitting: Registered Nurse

## 2023-05-31 VITALS — BP 120/80 | HR 85 | Ht 68.0 in | Wt 184.0 lb

## 2023-05-31 DIAGNOSIS — Z79899 Other long term (current) drug therapy: Secondary | ICD-10-CM | POA: Diagnosis present

## 2023-05-31 DIAGNOSIS — M170 Bilateral primary osteoarthritis of knee: Secondary | ICD-10-CM

## 2023-05-31 DIAGNOSIS — Z5181 Encounter for therapeutic drug level monitoring: Secondary | ICD-10-CM

## 2023-05-31 DIAGNOSIS — M545 Low back pain, unspecified: Secondary | ICD-10-CM | POA: Diagnosis not present

## 2023-05-31 DIAGNOSIS — M25551 Pain in right hip: Secondary | ICD-10-CM | POA: Diagnosis present

## 2023-05-31 DIAGNOSIS — G894 Chronic pain syndrome: Secondary | ICD-10-CM

## 2023-05-31 DIAGNOSIS — G8929 Other chronic pain: Secondary | ICD-10-CM | POA: Diagnosis present

## 2023-05-31 DIAGNOSIS — M2550 Pain in unspecified joint: Secondary | ICD-10-CM

## 2023-05-31 DIAGNOSIS — M25552 Pain in left hip: Secondary | ICD-10-CM

## 2023-05-31 MED ORDER — OXYCODONE HCL 15 MG PO TABS: 15.0000 mg | ORAL_TABLET | Freq: Four times a day (QID) | ORAL | 0 refills | Status: DC | PRN

## 2023-06-02 LAB — TOXASSURE SELECT,+ANTIDEPR,UR

## 2023-06-03 ENCOUNTER — Telehealth: Payer: Self-pay | Admitting: Registered Nurse

## 2023-06-03 MED ORDER — OXYCODONE HCL 10 MG PO TABS: 10.0000 mg | ORAL_TABLET | Freq: Four times a day (QID) | ORAL | 0 refills | Status: DC

## 2023-06-03 NOTE — Telephone Encounter (Signed)
 UDS Results : + Cocaine The results was discussed with Dr Lorilee Mr. Casebolt  was called he states he has never uses Cocaine, he was instructed to call [employer] [employer], he verbalizes understanding.  [facility] was called Oxycodone  15 mg prescription was canceled.

## 2023-06-04 MED ORDER — OXYCODONE HCL 5 MG PO TABS: ORAL_TABLET | ORAL | 0 refills | Status: DC

## 2023-06-04 MED ORDER — OXYCODONE-ACETAMINOPHEN 7.5-325 MG PO TABS: 1.0000 | ORAL_TABLET | Freq: Four times a day (QID) | ORAL | 0 refills | Status: DC | PRN

## 2023-06-04 NOTE — Telephone Encounter (Signed)
 Oxycodone wean prescription sent to pharmacy. William Crawford is aware via My-Chart message.

## 2023-06-08 ENCOUNTER — Telehealth: Payer: Self-pay | Admitting: Registered Nurse

## 2023-06-08 NOTE — Telephone Encounter (Signed)
 Patient is requesting William Crawford call him.

## 2023-06-09 NOTE — Telephone Encounter (Signed)
 Return William Crawford call, no answer.  Left message to return the call.

## 2023-06-14 ENCOUNTER — Emergency Department (HOSPITAL_COMMUNITY)
Admission: EM | Admit: 2023-06-14 | Discharge: 2023-06-14 | Disposition: A | Discharge status: Home / Self Care | Payer: Medicaid Other | Attending: Emergency Medicine | Admitting: Emergency Medicine

## 2023-06-14 ENCOUNTER — Emergency Department (HOSPITAL_COMMUNITY): Payer: Medicaid Other

## 2023-06-14 DIAGNOSIS — I2510 Atherosclerotic heart disease of native coronary artery without angina pectoris: Secondary | ICD-10-CM | POA: Diagnosis not present

## 2023-06-14 DIAGNOSIS — M62830 Muscle spasm of back: Secondary | ICD-10-CM | POA: Insufficient documentation

## 2023-06-14 DIAGNOSIS — R079 Chest pain, unspecified: Secondary | ICD-10-CM | POA: Diagnosis not present

## 2023-06-14 DIAGNOSIS — I509 Heart failure, unspecified: Secondary | ICD-10-CM | POA: Diagnosis not present

## 2023-06-14 DIAGNOSIS — M25512 Pain in left shoulder: Secondary | ICD-10-CM | POA: Diagnosis present

## 2023-06-14 LAB — CBC WITH DIFFERENTIAL/PLATELET
Abs Immature Granulocytes: 0.01 10*3/uL (ref 0.00–0.07)
Basophils Absolute: 0.1 10*3/uL (ref 0.0–0.1)
Basophils Relative: 1 %
Eosinophils Absolute: 0.2 10*3/uL (ref 0.0–0.5)
Eosinophils Relative: 3 %
HCT: 37.6 % — ABNORMAL LOW (ref 39.0–52.0)
Hemoglobin: 12.6 g/dL — ABNORMAL LOW (ref 13.0–17.0)
Immature Granulocytes: 0 %
Lymphocytes Relative: 29 %
Lymphs Abs: 1.8 10*3/uL (ref 0.7–4.0)
MCH: 29.7 pg (ref 26.0–34.0)
MCHC: 33.5 g/dL (ref 30.0–36.0)
MCV: 88.7 fL (ref 80.0–100.0)
Monocytes Absolute: 0.8 10*3/uL (ref 0.1–1.0)
Monocytes Relative: 12 %
Neutro Abs: 3.4 10*3/uL (ref 1.7–7.7)
Neutrophils Relative %: 55 %
Platelets: 244 10*3/uL (ref 150–400)
RBC: 4.24 MIL/uL (ref 4.22–5.81)
RDW: 11.9 % (ref 11.5–15.5)
WBC: 6.1 10*3/uL (ref 4.0–10.5)
nRBC: 0 % (ref 0.0–0.2)

## 2023-06-14 LAB — BASIC METABOLIC PANEL
Anion gap: 8 (ref 5–15)
BUN: 9 mg/dL (ref 6–20)
CO2: 26 mmol/L (ref 22–32)
Calcium: 8.9 mg/dL (ref 8.9–10.3)
Chloride: 105 mmol/L (ref 98–111)
Creatinine, Ser: 1.16 mg/dL (ref 0.61–1.24)
GFR, Estimated: >60 mL/min (ref >60)
Glucose, Bld: 66 mg/dL — ABNORMAL LOW (ref 70–99)
Potassium: 4.1 mmol/L (ref 3.5–5.1)
Sodium: 139 mmol/L (ref 135–145)

## 2023-06-14 LAB — TROPONIN I (HIGH SENSITIVITY): Troponin I (High Sensitivity): 8 ng/L (ref <18)

## 2023-06-14 LAB — CBG MONITORING, ED: Glucose-Capillary: 75 mg/dL (ref 70–99)

## 2023-06-14 MED ORDER — MORPHINE SULFATE (PF) 4 MG/ML IV SOLN
4.0000 mg | Freq: Once | INTRAVENOUS | Status: AC
  Administered 2023-06-14: 4 mg via INTRAVENOUS
  Filled 2023-06-14: qty 1

## 2023-06-14 MED ORDER — CYCLOBENZAPRINE HCL 10 MG PO TABS
10.0000 mg | ORAL_TABLET | Freq: Once | ORAL | Status: AC
  Administered 2023-06-14: 10 mg via ORAL
  Filled 2023-06-14: qty 1

## 2023-06-14 MED ORDER — LIDOCAINE 5 % EX PTCH
1.0000 | MEDICATED_PATCH | Freq: Once | CUTANEOUS | Status: DC
  Administered 2023-06-14: 1 via TRANSDERMAL
  Filled 2023-06-14: qty 1

## 2023-06-14 MED ORDER — ACETAMINOPHEN 500 MG PO TABS
1000.0000 mg | ORAL_TABLET | Freq: Once | ORAL | Status: AC
  Administered 2023-06-14: 1000 mg via ORAL
  Filled 2023-06-14: qty 2

## 2023-06-14 MED ORDER — KETOROLAC TROMETHAMINE 15 MG/ML IJ SOLN
15.0000 mg | Freq: Once | INTRAMUSCULAR | Status: AC
  Administered 2023-06-14: 15 mg via INTRAVENOUS
  Filled 2023-06-14: qty 1

## 2023-06-14 NOTE — ED Notes (Signed)
Patient transported to X-ray 

## 2023-06-14 NOTE — ED Notes (Signed)
Hooked patient back to the monitor patient is resting with call bell in reach and family at bedside

## 2023-06-14 NOTE — Discharge Instructions (Signed)
You were seen in the emergency department for your chest pain and back pain.  Your workup showed no signs of heart attack or stress on your heart and no abnormalities within your lung.  This may be muscle spasm type of pain and you can continue to take your home pain medications as prescribed.  You should follow-up with your cardiologist to have your symptoms rechecked as well.  You should return to the emergency department if you are having significantly worsening pain, develop numbness or weakness in your arms or legs, severe shortness of breath, you pass out or if you have any other new or concerning symptoms.

## 2023-06-14 NOTE — ED Triage Notes (Signed)
Pt bib ems from [facility] c/o chest pain since Saturday. Pt has had a dull pain that started in the left scapular region that radiates to chest and left elbow with intermittent sharp pain. Pt gets sob when the pain comes. Pt denies injury. Hx HF   Pt is on plavix and was told to not take aspirin by cardiologist.   18g IV Rt AC  BP 112/80 HR 77 RR 22 RA 99%

## 2023-06-14 NOTE — ED Provider Notes (Signed)
[place] EMERGENCY DEPARTMENT AT [facility] Provider Note   CSN: [id number] Arrival date & time: 06/14/23  1222     History  No chief complaint on file.   William Crawford is a 53 y.o. male.  Patient is a 53 year old male with a past medical history of CAD, CHF and chronic low back pain presenting to the emergency department with pain around his left scapula and left side of his chest.  The patient states that he initially had pain in his left scapula on Saturday and states over the last 2 days the pain is started to radiate around to the left side of his chest on the left shoulder and down his left arm.  He denies any recent falls, heavy lifting or repetitive activity.  He states that he feels pain constant pain and then will have an intermittent sharp stabbing pain that "takes his breath away" but otherwise does not feel short of breath.  He denies any fever or cough.  He denies any associated nausea, vomiting or diaphoresis.  He denies any lower extremity swelling.  He denies any history of VTE, any recent hospitalization or surgery, recent travel in the car or plane, hormone use or cancer history.  He was seen at urgent care this morning who referred him to the ED for further evaluation.        Home Medications Prior to Admission medications   Medication Sig Start Date End Date Taking? Authorizing Provider  amitriptyline (ELAVIL) 25 MG tablet Take 1 tablet (25 mg total) by mouth at bedtime. 03/26/23  Yes Raulkar, Drema Pry, MD  Ascorbic Acid (VITAMIN C) 100 MG tablet Take 100 mg by mouth daily.   Yes [provider]  atorvastatin (LIPITOR) 80 MG tablet Take 1 tablet (80 mg total) by mouth daily. 03/24/23 06/22/23 Yes Milford, Anderson Malta, FNP  carvedilol (COREG) 6.25 MG tablet TAKE 1 TABLET BY MOUTH TWICE A DAY 03/25/23  Yes Laurey Morale, MD  cetirizine (ZYRTEC) 10 MG tablet Take by mouth. 03/11/20  Yes [provider]  clopidogrel (PLAVIX) 75 MG  tablet TAKE 1 TABLET EVERY DAY 10/26/22  Yes Laurey Morale, MD  cyclobenzaprine (FLEXERIL) 10 MG tablet TAKE 1 TABLET BY MOUTH THREE TIMES A DAY AS NEEDED FOR MUSCLE SPASMS 05/13/23  Yes Jones Bales, NP  esomeprazole (NEXIUM) 40 MG capsule Take 40 mg by mouth daily.   Yes [provider]  fenofibrate (TRICOR) 48 MG tablet TAKE 1 TABLET DAILY 05/11/23  Yes Laurey Morale, MD  fluticasone [facility]) 50 MCG/ACT nasal spray Place 2 sprays into both nostrils daily.   Yes [provider]  gabapentin (NEURONTIN) 800 MG tablet Take 1 tablet (800 mg total) by mouth 3 (three) times daily. 04/26/23  Yes Jones Bales, NP  Magnesium 100 MG TABS Take 100 mg by mouth daily.   Yes [provider]  Magnesium Citrate 125 MG CAPS Take 125 mg by mouth daily.   Yes [provider]  Misc Natural Products (OSTEO BI-FLEX ADV JOINT SHIELD) TABS Take 2 tablets by mouth.   Yes [provider]  nitroGLYCERIN (NITROSTAT) 0.4 MG SL tablet Place 1 tablet (0.4 mg total) under the tongue every 5 (five) minutes as needed for chest pain. 03/27/22  Yes Raulkar, Drema Pry, MD  oxyCODONE (ROXICODONE) 15 MG immediate release tablet Take 15 mg by mouth every 4 (four) hours as needed for pain.   Yes [provider]  POTASSIUM PO Take  by mouth.   Yes [provider]  pyridOXINE (VITAMIN B6) 50 MG tablet Take 50 mg by mouth daily.   Yes [provider]  sacubitril-valsartan (ENTRESTO) 49-51 MG TAKE 1 TABLET BY MOUTH TWICE A DAY 10/26/22  Yes Laurey Morale, MD  thiamine 100 MG tablet Take 1 tablet by mouth daily. 11/12/19  Yes [provider]  traZODone (DESYREL) 100 MG tablet Take by mouth. 06/12/21  Yes [provider]  Turmeric (QC TUMERIC COMPLEX PO) Take by mouth.   Yes [provider]  vitamin B-12 (CYANOCOBALAMIN) 100 MCG tablet Take 100 mcg by mouth daily.   Yes [provider]  Vitamin D, Ergocalciferol, (DRISDOL) 1.25  MG (50000 UNIT) CAPS capsule TAKE 1 CAPSULE (50,000 UNITS TOTAL) BY MOUTH EVERY 7 (SEVEN) DAYS 05/14/23  Yes Raulkar, Drema Pry, MD  Vitamins C E (VITAMIN C & E COMPLEX PO) Take by mouth daily at 12 noon.   Yes [provider]      Allergies    Patient has no known allergies.    Review of Systems   Review of Systems  Physical Exam Updated Vital Signs BP 135/89   Pulse 89   Temp 97.9 F (36.6 C) (Oral)   Resp (!) 21   SpO2 100%  Physical Exam Vitals and nursing note reviewed.  Constitutional:      General: He is not in acute distress.    Appearance: Normal appearance.  HENT:     Head: Normocephalic and atraumatic.     Nose: Nose normal.     Mouth/Throat:     Mouth: Mucous membranes are moist.     Pharynx: Oropharynx is clear.  Eyes:     Extraocular Movements: Extraocular movements intact.     Conjunctiva/sclera: Conjunctivae normal.  Cardiovascular:     Rate and Rhythm: Normal rate and regular rhythm.     Pulses: Normal pulses.     Heart sounds: Normal heart sounds.  Pulmonary:     Effort: Pulmonary effort is normal.     Breath sounds: Normal breath sounds.  Abdominal:     General: Abdomen is flat.     Palpations: Abdomen is soft.     Tenderness: There is no abdominal tenderness.  Musculoskeletal:        General: Normal range of motion.     Cervical back: Normal range of motion.     Right lower leg: No edema.     Left lower leg: No edema.     Comments: Point tenderness along left scapular border, no overlying skin changes No significant reproducible chest wall tenderness  Skin:    General: Skin is warm and dry.  Neurological:     General: No focal deficit present.     Mental Status: He is alert and oriented to person, place, and time.  Psychiatric:        Mood and Affect: Mood normal.        Behavior: Behavior normal.     ED Results / Procedures / Treatments   Labs (all labs ordered are listed, but only abnormal results are displayed) Labs  Reviewed  BASIC METABOLIC PANEL - Abnormal; Notable for the following components:      Result Value   Glucose, Bld 66 (*)    All other components within normal limits  CBC WITH DIFFERENTIAL/PLATELET - Abnormal; Notable for the following components:   Hemoglobin 12.6 (*)    HCT 37.6 (*)    All other components within normal limits  CBG MONITORING, ED  TROPONIN I (HIGH SENSITIVITY)    EKG EKG Interpretation Date/Time:  Monday June 14 2023 12:24:30 EST Ventricular Rate:  76 PR Interval:  154 QRS Duration:  169 QT Interval:  404 QTC Calculation: 455 R Axis:   90  Text Interpretation: Sinus rhythm Probable left atrial enlargement RBBB and LPFB No significant change since last tracing Confirmed by Elayne Snare ([id number]) on 06/14/2023 12:27:40 PM  Radiology DG Chest 2 View Result Date: 06/14/2023 CLINICAL DATA:  Chest pain. EXAM: CHEST - 2 VIEW COMPARISON:  Chest radiograph dated May 14, 2020. FINDINGS: The heart size and mediastinal contours are within normal limits. Prior median sternotomy. No focal consolidation, pleural effusion, or pneumothorax. No acute osseous abnormality. IMPRESSION: No acute cardiopulmonary findings. Electronically Signed   By: Hart Robinsons M.D.   On: 06/14/2023 15:54    Procedures Procedures    Medications Ordered in ED Medications  lidocaine (LIDODERM) 5 % 1-3 patch (1 patch Transdermal Patch Applied 06/14/23 1335)  acetaminophen (TYLENOL) tablet 1,000 mg (1,000 mg Oral Given 06/14/23 1334)  ketorolac (TORADOL) 15 MG/ML injection 15 mg (15 mg Intravenous Given 06/14/23 1332)  cyclobenzaprine (FLEXERIL) tablet 10 mg (10 mg Oral Given 06/14/23 1334)  morphine (PF) 4 MG/ML injection 4 mg (4 mg Intravenous Given 06/14/23 1600)    ED Course/ Medical Decision Making/ A&P Clinical Course as of 06/14/23 1610  Mon Jun 14, 2023  1445 Mildly low glucose, will be re-checked. Initial troponin normal. Symptoms ongoing since Saturday so single troponin is  sufficient. [VK]  1532 Upon reassessment, patient still having pain. Will be given additional meds. Suspect likely musculoskeletal pain that does sound consistent with chest and shoulder pain he has seen his pain management doctor for in the past. CXR read is pending.  [VK]  1609 No acute abnormality on CXR. Patient is stable for discharge home with outpatient follow up. [VK]    Clinical Course User Index [VK] Rexford Maus, DO                                 Medical Decision Making This patient presents to the ED with chief complaint(s) of chest pain, left scapular pain with pertinent past medical history of CAD, CHF, chronic back pain which further complicates the presenting complaint. The complaint involves an extensive differential diagnosis and also carries with it a high risk of complications and morbidity.    The differential diagnosis includes ACS, arrhythmia, anemia, pneumonia, pneumothorax, pulmonary edema, pleural effusion, musculoskeletal pain, low risk by Wells criteria making PE unlikely, equal blood pressures and pulses bilaterally without neurodeficits making a dissection unlikely  Additional history obtained: Additional history obtained from N/A Records reviewed outpatient cardiology and pain management records  ED Course and Reassessment: On patient's arrival he is hemodynamically stable in no acute distress.  EKG on arrival showed normal sinus rhythm without acute ischemic changes.  Blood pressure in right upper extremity 113/80, left 107/74.  Patient will have labs including troponin and chest x-ray performed.  He will be given pain control and will be closely reassessed.  Independent labs interpretation:  The following labs were independently interpreted: within normal range  Independent visualization of imaging: - I independently visualized the following imaging with scope of interpretation limited to determining acute life threatening conditions related to  emergency care: CXR, which revealed no acute disease  Consultation: - Consulted or discussed management/test interpretation w/ external professional: N/A  Consideration for admission or further workup: Patient has no emergent conditions requiring admission or further work-up at this time and is stable for discharge home with primary care follow-up  Social Determinants of health: N/A    Amount and/or Complexity of Data Reviewed Labs: ordered. Radiology: ordered.  Risk OTC drugs. Prescription drug management.          Final Clinical Impression(s) / ED Diagnoses Final diagnoses:  Spasm of thoracic back muscle  Nonspecific chest pain    Rx / DC Orders ED Discharge Orders     None         Rexford Maus, DO 06/14/23 1610

## 2023-06-14 NOTE — ED Notes (Signed)
Got patient into a gown on the monitor did EKG patient is resting with call bell in reach

## 2023-06-23 ENCOUNTER — Other Ambulatory Visit (HOSPITAL_COMMUNITY): Payer: Self-pay

## 2023-06-23 DIAGNOSIS — I5022 Chronic systolic (congestive) heart failure: Secondary | ICD-10-CM

## 2023-06-23 MED ORDER — CARVEDILOL 6.25 MG PO TABS: 6.2500 mg | ORAL_TABLET | Freq: Two times a day (BID) | ORAL | 1 refills | Status: DC

## 2023-06-23 MED ORDER — ENTRESTO 49-51 MG PO TABS: 1.0000 | ORAL_TABLET | Freq: Two times a day (BID) | ORAL | 11 refills | Status: DC

## 2023-06-23 MED ORDER — CLOPIDOGREL BISULFATE 75 MG PO TABS: ORAL_TABLET | ORAL | 1 refills | Status: DC

## 2023-06-23 NOTE — Telephone Encounter (Signed)
Discharge letter sent via [employer] and [employer]PS.

## 2023-06-25 ENCOUNTER — Other Ambulatory Visit (HOSPITAL_COMMUNITY): Payer: Self-pay

## 2023-06-25 DIAGNOSIS — I5022 Chronic systolic (congestive) heart failure: Secondary | ICD-10-CM

## 2023-06-25 MED ORDER — FENOFIBRATE 48 MG PO TABS: 48.0000 mg | ORAL_TABLET | Freq: Every day | ORAL | 1 refills | Status: DC

## 2023-06-25 MED ORDER — ATORVASTATIN CALCIUM 80 MG PO TABS: 80.0000 mg | ORAL_TABLET | Freq: Every day | ORAL | 1 refills | Status: DC

## 2023-07-02 ENCOUNTER — Encounter: Payer: Medicaid Other | Admitting: Registered Nurse

## 2023-09-16 ENCOUNTER — Other Ambulatory Visit: Payer: Self-pay | Admitting: Cardiology

## 2023-09-16 DIAGNOSIS — I5022 Chronic systolic (congestive) heart failure: Secondary | ICD-10-CM

## 2023-09-17 ENCOUNTER — Encounter (HOSPITAL_COMMUNITY): Payer: Self-pay | Admitting: Cardiology

## 2023-09-17 ENCOUNTER — Ambulatory Visit (HOSPITAL_COMMUNITY)
Admission: RE | Admit: 2023-09-17 | Discharge: 2023-09-17 | Disposition: A | Discharge status: Home / Self Care | Source: Ambulatory Visit | Attending: Cardiology | Admitting: Cardiology

## 2023-09-17 VITALS — BP 116/74 | HR 63 | Ht 68.0 in | Wt 176.0 lb

## 2023-09-17 DIAGNOSIS — I252 Old myocardial infarction: Secondary | ICD-10-CM | POA: Diagnosis not present

## 2023-09-17 DIAGNOSIS — I110 Hypertensive heart disease with heart failure: Secondary | ICD-10-CM | POA: Diagnosis not present

## 2023-09-17 DIAGNOSIS — Z7902 Long term (current) use of antithrombotics/antiplatelets: Secondary | ICD-10-CM | POA: Insufficient documentation

## 2023-09-17 DIAGNOSIS — I5022 Chronic systolic (congestive) heart failure: Secondary | ICD-10-CM | POA: Diagnosis not present

## 2023-09-17 DIAGNOSIS — Z8674 Personal history of sudden cardiac arrest: Secondary | ICD-10-CM | POA: Insufficient documentation

## 2023-09-17 DIAGNOSIS — Z87891 Personal history of nicotine dependence: Secondary | ICD-10-CM | POA: Diagnosis not present

## 2023-09-17 DIAGNOSIS — I2510 Atherosclerotic heart disease of native coronary artery without angina pectoris: Secondary | ICD-10-CM | POA: Diagnosis not present

## 2023-09-17 DIAGNOSIS — I255 Ischemic cardiomyopathy: Secondary | ICD-10-CM | POA: Diagnosis not present

## 2023-09-17 DIAGNOSIS — Z79899 Other long term (current) drug therapy: Secondary | ICD-10-CM | POA: Diagnosis not present

## 2023-09-17 DIAGNOSIS — Z955 Presence of coronary angioplasty implant and graft: Secondary | ICD-10-CM | POA: Diagnosis not present

## 2023-09-17 LAB — COMPREHENSIVE METABOLIC PANEL WITH GFR
ALT: 15 U/L (ref 0–44)
AST: 19 U/L (ref 15–41)
Albumin: 4.2 g/dL (ref 3.5–5.0)
Alkaline Phosphatase: 50 U/L (ref 38–126)
Anion gap: 8 (ref 5–15)
BUN: 7 mg/dL (ref 6–20)
CO2: 31 mmol/L (ref 22–32)
Calcium: 9.6 mg/dL (ref 8.9–10.3)
Chloride: 102 mmol/L (ref 98–111)
Creatinine, Ser: 1.02 mg/dL (ref 0.61–1.24)
GFR, Estimated: >60 mL/min (ref >60)
Glucose, Bld: 85 mg/dL (ref 70–99)
Potassium: 4.7 mmol/L (ref 3.5–5.1)
Sodium: 141 mmol/L (ref 135–145)
Total Bilirubin: 0.4 mg/dL (ref 0.0–1.2)
Total Protein: 6.8 g/dL (ref 6.5–8.1)

## 2023-09-17 LAB — LIPID PANEL
Cholesterol: 119 mg/dL (ref 0–200)
HDL: 50 mg/dL (ref >40)
LDL Cholesterol: 54 mg/dL (ref 0–99)
Total CHOL/HDL Ratio: 2.4 ratio
Triglycerides: 74 mg/dL (ref <150)
VLDL: 15 mg/dL (ref 0–40)

## 2023-09-17 LAB — BRAIN NATRIURETIC PEPTIDE: B Natriuretic Peptide: 92 pg/mL (ref 0.0–100.0)

## 2023-09-17 MED ORDER — ATORVASTATIN CALCIUM 80 MG PO TABS: 80.0000 mg | ORAL_TABLET | Freq: Every day | ORAL | 3 refills | Status: DC

## 2023-09-17 MED ORDER — CARVEDILOL 6.25 MG PO TABS: 6.2500 mg | ORAL_TABLET | Freq: Two times a day (BID) | ORAL | 3 refills | Status: AC

## 2023-09-17 MED ORDER — ENTRESTO 49-51 MG PO TABS: 1.0000 | ORAL_TABLET | Freq: Two times a day (BID) | ORAL | 11 refills | Status: AC

## 2023-09-17 MED ORDER — FENOFIBRATE 48 MG PO TABS: 48.0000 mg | ORAL_TABLET | Freq: Every day | ORAL | 3 refills | Status: AC

## 2023-09-17 MED ORDER — CLOPIDOGREL BISULFATE 75 MG PO TABS: ORAL_TABLET | ORAL | 3 refills | Status: DC

## 2023-09-17 NOTE — Patient Instructions (Signed)
 There has been no changes to your medications.  Labs done today, your results will be available in MyChart, we will contact you for abnormal readings.  Your physician has requested that you have an echocardiogram. Echocardiography is a painless test that uses sound waves to create images of your heart. It provides your doctor with information about the size and shape of your heart and how well your heart's chambers and valves are working. This procedure takes approximately one hour. There are no restrictions for this procedure. Please do NOT wear cologne, perfume, aftershave, or lotions (deodorant is allowed). Please arrive 15 minutes prior to your appointment time.  Please note: We ask at that you not bring children with you during ultrasound (echo/ vascular) testing. Due to room size and safety concerns, children are not allowed in the ultrasound rooms during exams. Our front office staff cannot provide observation of children in our lobby area while testing is being conducted. An adult accompanying a patient to their appointment will only be allowed in the ultrasound room at the discretion of the ultrasound technician under special circumstances. We apologize for any inconvenience.  Your physician recommends that you schedule a follow-up appointment in: 6 months.  If you have any questions or concerns before your next appointment please send [place] a message through [employer] or call our office at [phone].    TO LEAVE A MESSAGE FOR THE NURSE SELECT OPTION 2, PLEASE LEAVE A MESSAGE INCLUDING: YOUR NAME DATE OF BIRTH CALL BACK NUMBER REASON FOR CALL**this is important as we prioritize the call backs  YOU WILL RECEIVE A CALL BACK THE SAME DAY AS LONG AS YOU CALL BEFORE 4:00 PM  At the [facility], you and your health needs are our priority. As part of our continuing mission to provide you with exceptional heart care, we have created designated Provider Care Teams. These Care  Teams include your primary Cardiologist (physician) and Advanced Practice Providers (APPs- Physician Assistants and Nurse Practitioners) who all work together to provide you with the care you need, when you need it.   You may see any of the following providers on your designated Care Team at your next follow up: Dr Arvilla Meres Dr Marca Ancona Dr. Dorthula Nettles Dr. Clearnce Hasten Amy Filbert Schilder, NP Robbie Lis, [place] [facility] [place], [place] Brynda Peon, NP [place] Lee, NP Clarisa Kindred, NP Karle Plumber, PharmD Enos Fling, PharmD   Please be sure to bring in all your medications bottles to every appointment.    Thank you for choosing [facility]

## 2023-09-19 NOTE — Progress Notes (Signed)
 PCP: Tura Gaines, MD  HF Cardiology: Dr. Mitzie Anda  Chief complaint: CHF  53 y.o. with history of heavy smoking, heavy ETOH, and strong FH of CAD was initially admitted to [facility] on 10/17/19 with chest pain and NSTEMI.  LHC showed 95% distal RCA (culprit) with chronically occluded proximal LAD and OM1 with collaterals.  He had DES to distal RCA initially.  Echo showed EF 35-40% with inferior wall motion abnormality and normal RV.  He was discharged home with plan for CTO intervention on LAD.  He came for elective CTO procedure in 6/21, however he had perforation of the LAD with balloon inflation. A covered stent was placed.  Echo showed EF 45% with apical hypokinesis, moderate pericardial effusion and evidence for tamponade.  He was then taken to the OR for LAD repair and drainage of pericardial effusion.    In the OR, he had a VF arrest with about 15 minutes of CPR.  He was put on cardiopulmonary bypass and the tear in the LAD was repaired.  The LAD was not graftable.  TEE in the OR showed severe biventricular failure, suspect stunning post-VF arrest.  He was unable to come off bypass.  Therefore, he was cannulated for ECMO with venous drainage from RA and arterial return to ascending aorta.  After 4 days, ECMO was decannulated and Impella 5.5 was placed.  4 days after that, Impella was removed. He was eventually weaned off pressors/inotropes and discharged home.  Echo in 11/03/19 showed EF up to 45-50%.    Echo was done in 9/21 with EF 55% with mild anteroseptal hypokinesis and normal RV.   Patient was admitted with presyncope in 10/21.  Workup was unremarkable, thought to be possible vasovagal event.  As HR was around 50 at times, Coreg  was stopped. Entresto  was also decreased. Echo in 10/21 showed EF 50-55% with normal RV. Zio patch showed no significant arrhythmias.   Echo in 10/21 showed EF 50% with mid inferolateral severe hypokinesis, normal RV, mild MR. Echo in 9/22 was stable with LV EF 50%,  normal RV.   Echo 6/24 EF 50-55%, normal RV  Today he returns for HF follow up. He is working in Aeronautical engineer.  Main problem has been low back pain and knee pain.  This has been fairly severe and limiting. He has been taking all his meds except he has been out of Entresto  x 1 week.  No recent chest pain, no exertional dyspnea.  Weight is stable.   ECG (personally reviewed): NSR, RBBB  Labs (7/21): K 4.1, creatinine 1.08 Labs (8/21): K 3.9, creatinine 1.04, LDL 67, HDL 37, TGs 248 Labs (12/21): K 3.7, creatinine 1.2 Labs (3/22): LDL 57, HDl 36, TGs 80 Labs (7/23): LDL 71 Labs (4/24): K 4.7, creatinine 1.28 Labs (6/24): LDL 69 Labs (12/24): LDL 71 Labs (1/25): K 4.1, creatinine 1.16  PMH: 1. ETOH abuse 2. Smoking 3. GERD 4. CAD: NSTEMI in 5/21 with 95% distal RCA (culprit) and CTO LAD and OM1.  He had DES to distal RCA.  - He returned in 6/21 for CTO procedure to LAD, complicated by perforation of the LAD.  He was taken to the OR emergently for repair of the perforation.  Unable to graft the LAD.  5. VF arrest: 6/21 at time of LAD perforation/tamponade. 6. Ischemic cardiomyopathy:  - Echo (5/21): EF 35-40%. - Echo (6/21, post arrest): EF < 20%, moderately reduced RV function.  - ECMO in 6/21 post-LAD perforation and arrest.  - Echo (  6/21): EF 45-50%.  - Echo (9/21): EF 55%, mild anteroseptal hypokinesis, normal RV.  - Echo (10/21): EF 50-55% with normal RV. - Echo (9/22): EF 50%, normal RV.  - Echo (6/24): EF 50-55%, normal RV 7. Right atrial thrombus: Noted post-ECMO.  Warfarin.  8. Presyncopal episode in 10/21: Zio patch (10/21) with no significant arrhythmias noted.  9. Neuropathic chest wall pain post-sternotomy.   Social History   Socioeconomic History   Marital status: Single    Spouse name: Not on file   Number of children: Not on file   Years of education: Not on file   Highest education level: Not on file  Occupational History   Not on file  Tobacco Use    Smoking status: Former    Current packs/day: 0.00    Types: Cigarettes    Quit date: 10/23/2019    Years since quitting: 3.9   Smokeless tobacco: Never  Vaping Use   Vaping status: Never Used  Substance and Sexual Activity   Alcohol use: Not Currently    Comment: QUIT  IN MAY 62130   Drug use: No   Sexual activity: Not on file  Other Topics Concern   Not on file  Social History Narrative   Not on file   Social Drivers of Health   Financial Resource Strain: Not on file  Food Insecurity: Low Risk  (09/13/2023)   Received from [facility]   Hunger Vital Sign    Worried About Running Out of Food in the Last Year: Never true    Ran Out of Food in the Last Year: Never true  Transportation Needs: No Transportation Needs (09/13/2023)   Received from [facility]    In the past 12 months, has lack of reliable transportation kept you from medical appointments, meetings, work or from getting things needed for daily living? : No  Physical Activity: Not on file  Stress: Not on file  Social Connections: Not on file  Intimate Partner Violence: Not on file   Family History  Problem Relation Age of Onset   Heart attack Father    Heart disease Father    Heart attack Paternal Grandfather    Heart disease Paternal Grandfather    Heart attack Paternal Uncle    Heart disease Paternal Uncle    Heart attack Paternal Uncle    Heart disease Paternal Uncle    ROS: All systems reviewed and negative except as per HPI.   Current Outpatient Medications  Medication Sig Dispense Refill   Ascorbic Acid (VITAMIN C) 100 MG tablet Take 100 mg by mouth daily.     cetirizine (ZYRTEC) 10 MG tablet Take by mouth.     esomeprazole  (NEXIUM ) 40 MG capsule Take 40 mg by mouth daily.     fluticasone (FLONASE) 50 MCG/ACT nasal spray Place 2 sprays into both nostrils daily.     gabapentin  (NEURONTIN ) 800 MG tablet Take 1 tablet (800 mg total) by mouth 3 (three) times daily. 90 tablet 3    Magnesium  100 MG TABS Take 100 mg by mouth daily.     Magnesium  Citrate 125 MG CAPS Take 125 mg by mouth daily.     Misc Natural Products (OSTEO BI-FLEX ADV JOINT SHIELD) TABS Take 2 tablets by mouth.     nitroGLYCERIN  (NITROSTAT ) 0.4 MG SL tablet Place 1 tablet (0.4 mg total) under the tongue every 5 (five) minutes as needed for chest pain. 30 tablet 12   POTASSIUM PO Take by mouth.  pyridOXINE (VITAMIN B6) 50 MG tablet Take 50 mg by mouth daily.     thiamine  100 MG tablet Take 1 tablet by mouth daily.     traZODone  (DESYREL ) 100 MG tablet Take by mouth.     Turmeric (QC TUMERIC COMPLEX PO) Take by mouth.     vitamin B-12 (CYANOCOBALAMIN) 100 MCG tablet Take 100 mcg by mouth daily.     Vitamin D , Ergocalciferol , (DRISDOL ) 1.25 MG (50000 UNIT) CAPS capsule TAKE 1 CAPSULE (50,000 UNITS TOTAL) BY MOUTH EVERY 7 (SEVEN) DAYS 12 capsule 1   Vitamins C E (VITAMIN C & E COMPLEX PO) Take by mouth daily at 12 noon.     atorvastatin  (LIPITOR ) 80 MG tablet Take 1 tablet (80 mg total) by mouth daily. 90 tablet 3   carvedilol  (COREG ) 6.25 MG tablet Take 1 tablet (6.25 mg total) by mouth 2 (two) times daily. 180 tablet 3   clopidogrel  (PLAVIX ) 75 MG tablet TAKE 1 TABLET EVERY DAY 90 tablet 3   fenofibrate  (TRICOR ) 48 MG tablet Take 1 tablet (48 mg total) by mouth daily. 90 tablet 3   oxyCODONE  (ROXICODONE ) 15 MG immediate release tablet Take 15 mg by mouth every 4 (four) hours as needed for pain. (Patient not taking: Reported on 09/17/2023)     sacubitril -valsartan  (ENTRESTO ) 49-51 MG Take 1 tablet by mouth 2 (two) times daily. 60 tablet 11   No current facility-administered medications for this encounter.   Wt Readings from Last 3 Encounters:  09/17/23 79.8 kg (176 lb)  05/31/23 83.5 kg (184 lb)  04/30/23 83 kg (183 lb)   BP 116/74   Pulse 63   Ht 5\' 8"  (1.727 m)   Wt 79.8 kg (176 lb)   SpO2 98%   BMI 26.76 kg/m  General: NAD Neck: No JVD, no thyromegaly or thyroid  nodule.  Lungs: Clear to  auscultation bilaterally with normal respiratory effort. CV: Nondisplaced PMI.  Heart regular S1/S2, no S3/S4, no murmur.  No peripheral edema.  No carotid bruit.  Normal pedal pulses.  Abdomen: Soft, nontender, no hepatosplenomegaly, no distention.  Skin: Intact without lesions or rashes.  Neurologic: Alert and oriented x 3.  Psych: Normal affect. Extremities: No clubbing or cyanosis.  HEENT: Normal.   Assessment/Plan: 1. CAD: NSTEMI in 5/21 with DES to distal RCA.  He was left with occluded OM1 and LAD. CTO procedure was attempted to open LAD, but complicated by LAD perforation and tamponade/cardiac arrest requiring trip to OR and [facility] ECMO. The LAD was not graftable, he has no perfusion to the LAD territory except by collaterals.  No chest pain.  - Continue clopidogrel  75 mg daily.  - Continue atorvastatin , check lipids. 2. Cardiac arrest: In OR after LAD perforation/tamponade.  - With recovery of EF, he is off amiodarone .  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   - Off warfarin with recovery of EF and no thrombus on last echo.  4. Smoking: He remains abstinent. Congratulated 5. ETOH abuse: rare use.  6. Chronic systolic CHF: Ischemic cardiomyopathy.  Echo after cardiac arrest in 6/21 showed EF < 20% with moderate RV dysfunction.  Echo in 6/21 after decannulation of ECMO and removal of Impella 5.5 showed EF up to 45-50%.  Echo in 10/21 with EF 50-55%.  Echo in 9/22 with EF 50%. Echo 6/24 EF 50-55%, normal RV. He is not volume overloaded, NYHA class II (predominantly limited by orthopedic pain).   - Continue Coreg  6.25 mg bid.  -  Continue Entresto  49/51 bid. BMET/BNP today.  - Unable to tolerate spironolactone  due to painful gynecomastia - He is not on a loop diuretic and does not appear to need one.  - EF improved off SGLT2i, so will hold of starting as to not volume deplete. 7. HTN: BP controlled.   Follow up 6 months with APP  I spent 21 minutes  reviewing records, interviewing/examining patient, and managing orders.   William Crawford  09/19/2023

## 2023-10-26 ENCOUNTER — Ambulatory Visit (HOSPITAL_COMMUNITY)
Admission: RE | Admit: 2023-10-26 | Discharge: 2023-10-26 | Disposition: A | Discharge status: Home / Self Care | Source: Ambulatory Visit | Attending: Cardiology | Admitting: Cardiology

## 2023-10-26 DIAGNOSIS — I5022 Chronic systolic (congestive) heart failure: Secondary | ICD-10-CM | POA: Insufficient documentation

## 2023-10-27 ENCOUNTER — Ambulatory Visit (HOSPITAL_COMMUNITY): Payer: Self-pay | Admitting: Cardiology

## 2023-10-27 LAB — ECHOCARDIOGRAM COMPLETE
Area-P 1/2: 3.77 cm2
Calc EF: 56 %
S' Lateral: 3.4 cm
Single Plane A2C EF: 54.9 %
Single Plane A4C EF: 57.7 %

## 2023-12-13 ENCOUNTER — Telehealth (HOSPITAL_COMMUNITY): Payer: Self-pay | Admitting: Pharmacy Technician

## 2023-12-13 NOTE — Telephone Encounter (Signed)
 [facility] Patient Advocate Encounter  Patient left vm requesting refill of Fenofibrate . Sent 90 day RX request to [employer]) to send to [facility]).   Almarie JULIANNA Pa, CPhT

## 2024-02-16 ENCOUNTER — Telehealth (HOSPITAL_COMMUNITY): Payer: Self-pay

## 2024-02-16 NOTE — Telephone Encounter (Signed)
 Received a fax requesting medical records from [employer]. Records were successfully faxed to: [phone] ,which was the number provided.. Medical request form will be scanned into patients chart.   Phone number: [phone]

## 2024-02-17 LAB — COLOGUARD: COLOGUARD: NEGATIVE

## 2024-03-15 NOTE — Progress Notes (Incomplete)
 PCP: Tanda Prentice DEL, MD  HF Cardiology: Dr. Rolan  Chief complaint: CHF  53 y.o. with history of heavy smoking, heavy ETOH, and strong FH of CAD was initially admitted to [facility] on 10/17/19 with chest pain and NSTEMI.  LHC showed 95% distal RCA (culprit) with chronically occluded proximal LAD and OM1 with collaterals.  He had DES to distal RCA initially.  Echo showed EF 35-40% with inferior wall motion abnormality and normal RV.  He was discharged home with plan for CTO intervention on LAD.  He came for elective CTO procedure in 6/21, however he had perforation of the LAD with balloon inflation. A covered stent was placed.  Echo showed EF 45% with apical hypokinesis, moderate pericardial effusion and evidence for tamponade.  He was then taken to the OR for LAD repair and drainage of pericardial effusion.    In the OR, he had a VF arrest with about 15 minutes of CPR.  He was put on cardiopulmonary bypass and the tear in the LAD was repaired.  The LAD was not graftable.  TEE in the OR showed severe biventricular failure, suspect stunning post-VF arrest.  He was unable to come off bypass.  Therefore, he was cannulated for ECMO with venous drainage from RA and arterial return to ascending aorta.  After 4 days, ECMO was decannulated and Impella 5.5 was placed.  4 days after that, Impella was removed. He was eventually weaned off pressors/inotropes and discharged home.  Echo in 11/03/19 showed EF up to 45-50%.    Echo was done in 9/21 with EF 55% with mild anteroseptal hypokinesis and normal RV.   Patient was admitted with presyncope in 10/21.  Workup was unremarkable, thought to be possible vasovagal event.  As HR was around 50 at times, Coreg  was stopped. Entresto  was also decreased. Echo in 10/21 showed EF 50-55% with normal RV. Zio patch showed no significant arrhythmias.   Echo in 10/21 showed EF 50% with mid inferolateral severe hypokinesis, normal RV, mild MR. Echo in 9/22 was stable with LV EF 50%,  normal RV.   Echo 6/24 EF 50-55%, normal RV  Today he returns for HF follow up. He is working in Aeronautical [employer].  Main problem has been low back pain and knee pain.  This has been fairly severe and limiting. He has been taking all his meds except he has been out of Entresto  x 1 week.  No recent chest pain, no exertional dyspnea.  Weight is stable.   ECG (personally reviewed): NSR, RBBB  Labs (7/21): K 4.1, creatinine 1.08 Labs (8/21): K 3.9, creatinine 1.04, LDL 67, HDL 37, TGs 248 Labs (12/21): K 3.7, creatinine 1.2 Labs (3/22): LDL 57, HDl 36, TGs 80 Labs (7/23): LDL 71 Labs (4/24): K 4.7, creatinine 1.28 Labs (6/24): LDL 69 Labs (12/24): LDL 71 Labs (1/25): K 4.1, creatinine 1.16  PMH: 1. ETOH abuse 2. Smoking 3. GERD 4. CAD: NSTEMI in 5/21 with 95% distal RCA (culprit) and CTO LAD and OM1.  He had DES to distal RCA.  - He returned in 6/21 for CTO procedure to LAD, complicated by perforation of the LAD.  He was taken to the OR emergently for repair of the perforation.  Unable to graft the LAD.  5. VF arrest: 6/21 at time of LAD perforation/tamponade. 6. Ischemic cardiomyopathy:  - Echo (5/21): EF 35-40%. - Echo (6/21, post arrest): EF < 20%, moderately reduced RV function.  - ECMO in 6/21 post-LAD perforation and arrest.  - Echo (  6/21): EF 45-50%.  - Echo (9/21): EF 55%, mild anteroseptal hypokinesis, normal RV.  - Echo (10/21): EF 50-55% with normal RV. - Echo (9/22): EF 50%, normal RV.  - Echo (6/24): EF 50-55%, normal RV 7. Right atrial thrombus: Noted post-ECMO.  Warfarin.  8. Presyncopal episode in 10/21: Zio patch (10/21) with no significant arrhythmias noted.  9. Neuropathic chest wall pain post-sternotomy.   Social History   Socioeconomic History   Marital status: Single    Spouse name: Not on file   Number of children: Not on file   Years of education: Not on file   Highest education level: Not on file  Occupational History   Not on file  Tobacco Use    Smoking status: Former    Current packs/day: 0.00    Types: Cigarettes    Quit date: 10/23/2019    Years since quitting: 4.3   Smokeless tobacco: Never  Vaping Use   Vaping status: Never Used  Substance and Sexual Activity   Alcohol use: Not Currently    Comment: QUIT  IN MAY 79878   Drug use: No   Sexual activity: Not on file  Other Topics Concern   Not on file  Social History Narrative   Not on file   Social Drivers of Health   Financial Resource Strain: Not on file  Food Insecurity: Low Risk  (09/13/2023)   Received from [facility]   Hunger Vital Sign    Within the past 12 months, you worried that your food would run out before you got money to buy more: Never true    Within the past 12 months, the food you bought just didn't last and you didn't have money to get more. : Never true  Transportation Needs: No Transportation Needs (09/13/2023)   Received from [facility]    In the past 12 months, has lack of reliable transportation kept you from medical appointments, meetings, work or from getting things needed for daily living? : No  Physical Activity: Not on file  Stress: Not on file  Social Connections: Not on file  Intimate Partner Violence: Not on file   Family History  Problem Relation Age of Onset   Heart attack Father    Heart disease Father    Heart attack Paternal Grandfather    Heart disease Paternal Grandfather    Heart attack Paternal Uncle    Heart disease Paternal Uncle    Heart attack Paternal Uncle    Heart disease Paternal Uncle    ROS: All systems reviewed and negative except as per HPI.   Current Outpatient Medications  Medication Sig Dispense Refill   Ascorbic Acid (VITAMIN C) 100 MG tablet Take 100 mg by mouth daily.     atorvastatin  (LIPITOR ) 80 MG tablet Take 1 tablet (80 mg total) by mouth daily. 90 tablet 3   carvedilol  (COREG ) 6.25 MG tablet Take 1 tablet (6.25 mg total) by mouth 2 (two) times daily. 180 tablet 3    cetirizine (ZYRTEC) 10 MG tablet Take by mouth.     clopidogrel  (PLAVIX ) 75 MG tablet TAKE 1 TABLET EVERY DAY 90 tablet 3   esomeprazole  (NEXIUM ) 40 MG capsule Take 40 mg by mouth daily.     fenofibrate  (TRICOR ) 48 MG tablet Take 1 tablet (48 mg total) by mouth daily. 90 tablet 3   fluticasone (FLONASE) 50 MCG/ACT nasal spray Place 2 sprays into both nostrils daily.     gabapentin  (NEURONTIN ) 800 MG tablet  Take 1 tablet (800 mg total) by mouth 3 (three) times daily. 90 tablet 3   Magnesium  100 MG TABS Take 100 mg by mouth daily.     Magnesium  Citrate 125 MG CAPS Take 125 mg by mouth daily.     Misc Natural Products (OSTEO BI-FLEX ADV JOINT SHIELD) TABS Take 2 tablets by mouth.     nitroGLYCERIN  (NITROSTAT ) 0.4 MG SL tablet Place 1 tablet (0.4 mg total) under the tongue every 5 (five) minutes as needed for chest pain. 30 tablet 12   oxyCODONE  (ROXICODONE ) 15 MG immediate release tablet Take 15 mg by mouth every 4 (four) hours as needed for pain. (Patient not taking: Reported on 09/17/2023)     POTASSIUM PO Take by mouth.     pyridOXINE (VITAMIN B6) 50 MG tablet Take 50 mg by mouth daily.     sacubitril -valsartan  (ENTRESTO ) 49-51 MG Take 1 tablet by mouth 2 (two) times daily. 60 tablet 11   thiamine  100 MG tablet Take 1 tablet by mouth daily.     traZODone  (DESYREL ) 100 MG tablet Take by mouth.     Turmeric (QC TUMERIC COMPLEX PO) Take by mouth.     vitamin B-12 (CYANOCOBALAMIN) 100 MCG tablet Take 100 mcg by mouth daily.     Vitamin D , Ergocalciferol , (DRISDOL ) 1.25 MG (50000 UNIT) CAPS capsule TAKE 1 CAPSULE (50,000 UNITS TOTAL) BY MOUTH EVERY 7 (SEVEN) DAYS 12 capsule 1   Vitamins C E (VITAMIN C & E COMPLEX PO) Take by mouth daily at 12 noon.     No current facility-administered medications for this visit.   Wt Readings from Last 3 Encounters:  09/17/23 79.8 kg (176 lb)  05/31/23 83.5 kg (184 lb)  04/30/23 83 kg (183 lb)   There were no vitals taken for this visit. General: NAD Neck: No  JVD, no thyromegaly or thyroid  nodule.  Lungs: Clear to auscultation bilaterally with normal respiratory effort. CV: Nondisplaced PMI.  Heart regular S1/S2, no S3/S4, no murmur.  No peripheral edema.  No carotid bruit.  Normal pedal pulses.  Abdomen: Soft, nontender, no hepatosplenomegaly, no distention.  Skin: Intact without lesions or rashes.  Neurologic: Alert and oriented x 3.  Psych: Normal affect. Extremities: No clubbing or cyanosis.  HEENT: Normal.   Assessment/Plan: 1. CAD: NSTEMI in 5/21 with DES to distal RCA.  He was left with occluded OM1 and LAD. CTO procedure was attempted to open LAD, but complicated by LAD perforation and tamponade/cardiac arrest requiring trip to OR and [facility] ECMO. The LAD was not graftable, he has no perfusion to the LAD territory except by collaterals.  No chest pain.  - Continue clopidogrel  75 mg daily.  - Continue atorvastatin , check lipids. 2. Cardiac arrest: In OR after LAD perforation/tamponade.  - With recovery of EF, he is off amiodarone .  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   - Off warfarin with recovery of EF and no thrombus on last echo.  4. Smoking: He remains abstinent. Congratulated 5. ETOH abuse: rare use.  6. Chronic systolic CHF: Ischemic cardiomyopathy.  Echo after cardiac arrest in 6/21 showed EF < 20% with moderate RV dysfunction.  Echo in 6/21 after decannulation of ECMO and removal of Impella 5.5 showed EF up to 45-50%.  Echo in 10/21 with EF 50-55%.  Echo in 9/22 with EF 50%. Echo 6/24 EF 50-55%, normal RV. He is not volume overloaded, NYHA class II (predominantly limited by orthopedic pain).   - Continue  Coreg  6.25 mg bid.  - Continue Entresto  49/51 bid. BMET/BNP today.  - Unable to tolerate spironolactone  due to painful gynecomastia - He is not on a loop diuretic and does not appear to need one.  - EF improved off SGLT2i, so will hold of starting as to not volume deplete. 7. HTN: BP controlled.    Follow up 6 months with APP  I spent 21 minutes reviewing records, interviewing/examining patient, and managing orders.   Harlene HERO [facility]  03/15/2024

## 2024-03-16 ENCOUNTER — Telehealth (HOSPITAL_COMMUNITY): Payer: Self-pay

## 2024-03-16 NOTE — Telephone Encounter (Signed)
 Called to confirm/remind patient of their appointment at the [facility] on 03/17/24.   Appointment:   [x] Confirmed  [] Left mess   [] No answer/No voice mail  [] VM Full/unable to leave message  [] Phone not in service  Patient reminded to bring all medications and/or complete list.  Confirmed patient has transportation. Gave directions, instructed to utilize valet parking.

## 2024-03-17 ENCOUNTER — Ambulatory Visit (HOSPITAL_COMMUNITY)
Admission: RE | Admit: 2024-03-17 | Discharge: 2024-03-17 | Disposition: A | Discharge status: Home / Self Care | Source: Ambulatory Visit | Attending: Family Medicine | Admitting: Family Medicine

## 2024-03-17 ENCOUNTER — Ambulatory Visit (HOSPITAL_COMMUNITY): Payer: Self-pay | Admitting: Family Medicine

## 2024-03-17 ENCOUNTER — Encounter (HOSPITAL_COMMUNITY): Payer: Self-pay

## 2024-03-17 VITALS — BP 119/89 | HR 81 | Ht 68.0 in | Wt 166.8 lb

## 2024-03-17 DIAGNOSIS — Z7902 Long term (current) use of antithrombotics/antiplatelets: Secondary | ICD-10-CM | POA: Insufficient documentation

## 2024-03-17 DIAGNOSIS — I255 Ischemic cardiomyopathy: Secondary | ICD-10-CM | POA: Diagnosis not present

## 2024-03-17 DIAGNOSIS — Z8674 Personal history of sudden cardiac arrest: Secondary | ICD-10-CM | POA: Diagnosis not present

## 2024-03-17 DIAGNOSIS — I469 Cardiac arrest, cause unspecified: Secondary | ICD-10-CM

## 2024-03-17 DIAGNOSIS — I252 Old myocardial infarction: Secondary | ICD-10-CM | POA: Diagnosis not present

## 2024-03-17 DIAGNOSIS — I110 Hypertensive heart disease with heart failure: Secondary | ICD-10-CM | POA: Insufficient documentation

## 2024-03-17 DIAGNOSIS — Z8249 Family history of ischemic heart disease and other diseases of the circulatory system: Secondary | ICD-10-CM | POA: Insufficient documentation

## 2024-03-17 DIAGNOSIS — I513 Intracardiac thrombosis, not elsewhere classified: Secondary | ICD-10-CM

## 2024-03-17 DIAGNOSIS — I4510 Unspecified right bundle-branch block: Secondary | ICD-10-CM | POA: Diagnosis not present

## 2024-03-17 DIAGNOSIS — I5022 Chronic systolic (congestive) heart failure: Secondary | ICD-10-CM | POA: Insufficient documentation

## 2024-03-17 DIAGNOSIS — Z79899 Other long term (current) drug therapy: Secondary | ICD-10-CM | POA: Diagnosis not present

## 2024-03-17 DIAGNOSIS — F1011 Alcohol abuse, in remission: Secondary | ICD-10-CM

## 2024-03-17 DIAGNOSIS — Z87891 Personal history of nicotine dependence: Secondary | ICD-10-CM | POA: Insufficient documentation

## 2024-03-17 DIAGNOSIS — Z955 Presence of coronary angioplasty implant and graft: Secondary | ICD-10-CM | POA: Diagnosis not present

## 2024-03-17 DIAGNOSIS — F1010 Alcohol abuse, uncomplicated: Secondary | ICD-10-CM | POA: Diagnosis not present

## 2024-03-17 DIAGNOSIS — I2510 Atherosclerotic heart disease of native coronary artery without angina pectoris: Secondary | ICD-10-CM | POA: Diagnosis present

## 2024-03-17 LAB — BASIC METABOLIC PANEL WITH GFR
Anion gap: 7 (ref 5–15)
BUN: 11 mg/dL (ref 6–20)
CO2: 28 mmol/L (ref 22–32)
Calcium: 9.1 mg/dL (ref 8.9–10.3)
Chloride: 102 mmol/L (ref 98–111)
Creatinine, Ser: 1.04 mg/dL (ref 0.61–1.24)
GFR, Estimated: >60 mL/min (ref >60)
Glucose, Bld: 79 mg/dL (ref 70–99)
Potassium: 4.1 mmol/L (ref 3.5–5.1)
Sodium: 137 mmol/L (ref 135–145)

## 2024-03-17 NOTE — Addendum Note (Signed)
 Encounter addended by: Glena Harlene HERO, FNP on: 03/17/2024 2:32 PM  Actions taken: Clinical Note Signed

## 2024-03-17 NOTE — Addendum Note (Signed)
 Encounter addended by: Camya Haydon M, RN on: 03/17/2024 2:19 PM  Actions taken: Clinical Note Signed, Charge Capture section accepted

## 2024-03-17 NOTE — Patient Instructions (Addendum)
 Good to see you today!   No medication changes were made  Labs done today, your results will be available in MyChart, we will contact you for abnormal readings.  Your physician recommends that you schedule a follow-up appointment 6 months (April) Call office in February to schedule an appointment  If you have any questions or concerns before your next appointment please send us  a message through [place] or call our office at [phone].    TO LEAVE A MESSAGE FOR THE NURSE SELECT OPTION 2, PLEASE LEAVE A MESSAGE INCLUDING: YOUR NAME DATE OF BIRTH CALL BACK NUMBER REASON FOR CALL**this is important as we prioritize the call backs  YOU WILL RECEIVE A CALL BACK THE SAME DAY AS LONG AS YOU CALL BEFORE 4:00 PM At the [facility], you and your health needs are our priority. As part of our continuing mission to provide you with exceptional heart care, we have created designated Provider Care Teams. These Care Teams include your primary Cardiologist (physician) and Advanced Practice Providers (APPs- Physician Assistants and Nurse Practitioners) who all work together to provide you with the care you need, when you need it.   You may see any of the following providers on your designated Care Team at your next follow up: Dr Toribio Fuel Dr Ezra Shuck Dr. Ria Commander Dr. Morene Brownie Amy Lenetta, NP Caffie Shed, [place] [facility] [place], [place] Beckey Coe, NP [place] Lee, NP Ellouise Class, NP Tinnie Redman, PharmD Jaun Bash, PharmD   Please be sure to bring in all your medications bottles to every appointment.    Thank you for choosing [facility]

## 2024-06-30 ENCOUNTER — Other Ambulatory Visit (HOSPITAL_COMMUNITY): Payer: Self-pay | Admitting: Cardiology

## 2024-06-30 DIAGNOSIS — I5022 Chronic systolic (congestive) heart failure: Secondary | ICD-10-CM
